# Patient Record
Sex: Female | Born: 1985 | Race: White | Hispanic: No | Marital: Married | Smoking: Never smoker
Health system: Southern US, Community
[De-identification: ages and names within clinical notes are randomized; demographics above are authoritative.]

## PROBLEM LIST (undated history)

## (undated) DIAGNOSIS — R51 Headache: Secondary | ICD-10-CM

## (undated) DIAGNOSIS — M1710 Unilateral primary osteoarthritis, unspecified knee: Secondary | ICD-10-CM

## (undated) DIAGNOSIS — F4310 Post-traumatic stress disorder, unspecified: Secondary | ICD-10-CM

## (undated) DIAGNOSIS — M545 Low back pain, unspecified: Secondary | ICD-10-CM

## (undated) DIAGNOSIS — K219 Gastro-esophageal reflux disease without esophagitis: Secondary | ICD-10-CM

## (undated) DIAGNOSIS — F40231 Fear of injections and transfusions: Secondary | ICD-10-CM

## (undated) DIAGNOSIS — M069 Rheumatoid arthritis, unspecified: Secondary | ICD-10-CM

## (undated) DIAGNOSIS — F329 Major depressive disorder, single episode, unspecified: Secondary | ICD-10-CM

## (undated) DIAGNOSIS — M179 Osteoarthritis of knee, unspecified: Secondary | ICD-10-CM

## (undated) DIAGNOSIS — I4581 Long QT syndrome: Secondary | ICD-10-CM

## (undated) DIAGNOSIS — G4733 Obstructive sleep apnea (adult) (pediatric): Secondary | ICD-10-CM

## (undated) DIAGNOSIS — J189 Pneumonia, unspecified organism: Secondary | ICD-10-CM

## (undated) DIAGNOSIS — I10 Essential (primary) hypertension: Secondary | ICD-10-CM

## (undated) DIAGNOSIS — R569 Unspecified convulsions: Secondary | ICD-10-CM

## (undated) DIAGNOSIS — F309 Manic episode, unspecified: Secondary | ICD-10-CM

## (undated) DIAGNOSIS — F32A Depression, unspecified: Secondary | ICD-10-CM

## (undated) DIAGNOSIS — R519 Headache, unspecified: Secondary | ICD-10-CM

## (undated) DIAGNOSIS — Z9989 Dependence on other enabling machines and devices: Secondary | ICD-10-CM

## (undated) DIAGNOSIS — E119 Type 2 diabetes mellitus without complications: Secondary | ICD-10-CM

## (undated) DIAGNOSIS — E669 Obesity, unspecified: Secondary | ICD-10-CM

## (undated) DIAGNOSIS — F419 Anxiety disorder, unspecified: Secondary | ICD-10-CM

## (undated) DIAGNOSIS — G8929 Other chronic pain: Secondary | ICD-10-CM

## (undated) DIAGNOSIS — G43909 Migraine, unspecified, not intractable, without status migrainosus: Secondary | ICD-10-CM

## (undated) DIAGNOSIS — Z8489 Family history of other specified conditions: Secondary | ICD-10-CM

## (undated) HISTORY — DX: Essential (primary) hypertension: I10

## (undated) HISTORY — DX: Unilateral primary osteoarthritis, unspecified knee: M17.10

## (undated) HISTORY — DX: Gastro-esophageal reflux disease without esophagitis: K21.9

## (undated) HISTORY — DX: Osteoarthritis of knee, unspecified: M17.9

---
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
## 2005-02-02 ENCOUNTER — Emergency Department (HOSPITAL_COMMUNITY): Admission: EM | Admit: 2005-02-02 | Discharge: 2005-02-02 | Payer: Self-pay | Admitting: Emergency Medicine

## 2005-06-14 ENCOUNTER — Emergency Department (HOSPITAL_COMMUNITY): Admission: EM | Admit: 2005-06-14 | Discharge: 2005-06-14 | Payer: Self-pay | Admitting: Emergency Medicine

## 2005-06-20 ENCOUNTER — Emergency Department (HOSPITAL_COMMUNITY): Admission: EM | Admit: 2005-06-20 | Discharge: 2005-06-21 | Payer: Self-pay | Admitting: Emergency Medicine

## 2005-06-22 ENCOUNTER — Emergency Department (HOSPITAL_COMMUNITY): Admission: EM | Admit: 2005-06-22 | Discharge: 2005-06-22 | Payer: Self-pay | Admitting: Emergency Medicine

## 2006-05-05 ENCOUNTER — Emergency Department (HOSPITAL_COMMUNITY): Admission: EM | Admit: 2006-05-05 | Discharge: 2006-05-05 | Payer: Self-pay | Admitting: Emergency Medicine

## 2006-05-24 ENCOUNTER — Inpatient Hospital Stay (HOSPITAL_COMMUNITY): Admission: AD | Admit: 2006-05-24 | Discharge: 2006-05-24 | Payer: Self-pay | Admitting: Family Medicine

## 2007-03-24 ENCOUNTER — Emergency Department: Payer: Self-pay | Admitting: Emergency Medicine

## 2007-03-26 ENCOUNTER — Emergency Department: Payer: Self-pay | Admitting: Internal Medicine

## 2007-05-18 ENCOUNTER — Emergency Department: Payer: Self-pay | Admitting: Emergency Medicine

## 2007-10-06 ENCOUNTER — Emergency Department: Payer: Self-pay | Admitting: Emergency Medicine

## 2008-06-18 ENCOUNTER — Emergency Department (HOSPITAL_COMMUNITY): Admission: EM | Admit: 2008-06-18 | Discharge: 2008-06-18 | Payer: Self-pay | Admitting: Family Medicine

## 2008-08-20 ENCOUNTER — Emergency Department (HOSPITAL_COMMUNITY): Admission: EM | Admit: 2008-08-20 | Discharge: 2008-08-20 | Payer: Self-pay | Admitting: Family Medicine

## 2008-11-26 ENCOUNTER — Emergency Department (HOSPITAL_COMMUNITY): Admission: EM | Admit: 2008-11-26 | Discharge: 2008-11-26 | Payer: Self-pay | Admitting: Family Medicine

## 2009-05-15 ENCOUNTER — Emergency Department: Payer: Self-pay | Admitting: Emergency Medicine

## 2009-09-15 ENCOUNTER — Emergency Department (HOSPITAL_COMMUNITY): Admission: EM | Admit: 2009-09-15 | Discharge: 2009-09-15 | Payer: Self-pay | Admitting: Emergency Medicine

## 2009-09-28 ENCOUNTER — Emergency Department (HOSPITAL_COMMUNITY): Admission: EM | Admit: 2009-09-28 | Discharge: 2009-09-28 | Payer: Self-pay | Admitting: Family Medicine

## 2009-11-09 ENCOUNTER — Emergency Department: Payer: Self-pay | Admitting: Emergency Medicine

## 2010-04-05 ENCOUNTER — Emergency Department (HOSPITAL_COMMUNITY)
Admission: EM | Admit: 2010-04-05 | Discharge: 2010-04-05 | Payer: Self-pay | Source: Home / Self Care | Admitting: Emergency Medicine

## 2010-05-18 ENCOUNTER — Emergency Department (HOSPITAL_COMMUNITY): Admission: EM | Admit: 2010-05-18 | Discharge: 2010-05-18 | Payer: Self-pay | Admitting: Emergency Medicine

## 2010-06-07 ENCOUNTER — Emergency Department (HOSPITAL_COMMUNITY): Admission: EM | Admit: 2010-06-07 | Discharge: 2010-06-07 | Payer: Self-pay | Admitting: Family Medicine

## 2010-10-10 LAB — POCT PREGNANCY, URINE
Preg Test, Ur: NEGATIVE
Preg Test, Ur: NEGATIVE

## 2010-10-26 LAB — CULTURE, ROUTINE-ABSCESS

## 2011-02-09 ENCOUNTER — Inpatient Hospital Stay (INDEPENDENT_AMBULATORY_CARE_PROVIDER_SITE_OTHER)
Admission: RE | Admit: 2011-02-09 | Discharge: 2011-02-09 | Disposition: A | Discharge status: Home / Self Care | Payer: Self-pay | Source: Ambulatory Visit | Attending: Emergency Medicine | Admitting: Emergency Medicine

## 2011-02-09 ENCOUNTER — Ambulatory Visit (INDEPENDENT_AMBULATORY_CARE_PROVIDER_SITE_OTHER): Payer: Self-pay

## 2011-02-09 DIAGNOSIS — S90129A Contusion of unspecified lesser toe(s) without damage to nail, initial encounter: Secondary | ICD-10-CM

## 2011-04-22 LAB — POCT URINALYSIS DIP (DEVICE)
Glucose, UA: NEGATIVE mg/dL
Protein, ur: 30 mg/dL — AB
pH: 5.5 (ref 5.0–8.0)

## 2011-05-27 ENCOUNTER — Encounter: Payer: Self-pay | Admitting: *Deleted

## 2011-05-27 ENCOUNTER — Emergency Department (HOSPITAL_COMMUNITY)
Admission: EM | Admit: 2011-05-27 | Discharge: 2011-05-27 | Disposition: A | Discharge status: Home / Self Care | Payer: Self-pay | Attending: Emergency Medicine | Admitting: Emergency Medicine

## 2011-05-27 DIAGNOSIS — J069 Acute upper respiratory infection, unspecified: Secondary | ICD-10-CM | POA: Insufficient documentation

## 2011-05-27 DIAGNOSIS — IMO0001 Reserved for inherently not codable concepts without codable children: Secondary | ICD-10-CM | POA: Insufficient documentation

## 2011-05-27 DIAGNOSIS — R059 Cough, unspecified: Secondary | ICD-10-CM | POA: Insufficient documentation

## 2011-05-27 DIAGNOSIS — R0602 Shortness of breath: Secondary | ICD-10-CM | POA: Insufficient documentation

## 2011-05-27 DIAGNOSIS — R05 Cough: Secondary | ICD-10-CM | POA: Insufficient documentation

## 2011-05-27 MED ORDER — ALBUTEROL SULFATE HFA 108 (90 BASE) MCG/ACT IN AERS
2.0000 | INHALATION_SPRAY | RESPIRATORY_TRACT | Status: DC | PRN
  Administered 2011-05-27: 2 via RESPIRATORY_TRACT
  Filled 2011-05-27: qty 6.7

## 2011-05-27 MED ORDER — BENZONATATE 100 MG PO CAPS: 100.0000 mg | ORAL_CAPSULE | Freq: Three times a day (TID) | ORAL | Status: AC

## 2011-05-27 NOTE — ED Notes (Signed)
Pt states "started with a runny nose mid-afternoon Tuesday and has gradually gotten worse, coughs up dark green"; denies fever/n/v

## 2011-05-27 NOTE — ED Provider Notes (Signed)
History     CSN: [id number] Arrival date & time: 05/27/2011  1:13 PM   First MD Initiated Contact with Patient 05/27/11 1405      Chief Complaint  Patient presents with  . Asthma  . Shortness of Breath  . Nasal Congestion    (Consider location/radiation/quality/duration/timing/severity/associated sxs/prior treatment) Patient is a 25 y.o. female presenting with cough. The history is provided by the patient. No language interpreter was used.  Cough This is a recurrent problem. The current episode started more than 2 days ago. The problem occurs hourly. The problem has not changed since onset.The cough is productive of sputum. There has been no fever. Associated symptoms include ear congestion, rhinorrhea, sore throat, myalgias and shortness of breath. Pertinent negatives include no chest pain, no chills, no weight loss, no ear pain, no headaches, no wheezing and no eye redness. She has tried nothing for the symptoms. She is not a smoker. Her past medical history is significant for asthma.    Past Medical History  Diagnosis Date  . Arthritis     Past Surgical History  Procedure Date  . Tonsillectomy     No family history on file.  History  Substance Use Topics  . Smoking status: Never Smoker   . Smokeless tobacco: Not on file  . Alcohol Use: No    OB History    Grav Para Term Preterm Abortions TAB SAB Ect Mult Living                  Review of Systems  Constitutional: Negative for chills and weight loss.  HENT: Positive for sore throat and rhinorrhea. Negative for ear pain.   Eyes: Negative for redness.  Respiratory: Positive for cough and shortness of breath. Negative for wheezing.   Cardiovascular: Negative for chest pain.  Musculoskeletal: Positive for myalgias.  Neurological: Negative for headaches.  All other systems reviewed and are negative.    Allergies  Review of patient's allergies indicates no known allergies.  Home Medications   Current  Outpatient Rx  Name Route Sig Dispense Refill  . ALBUTEROL SULFATE HFA 108 (90 BASE) MCG/ACT IN AERS Inhalation Inhale 2 puffs into the lungs every 6 (six) hours as needed.      Marland Kitchen DIPHENHYDRAMINE HCL 25 MG PO TABS Oral Take 100 mg by mouth every 6 (six) hours as needed.      Marland Kitchen ALIVE WOMENS ENERGY PO Oral Take 1 tablet by mouth daily.      Marland Kitchen PSEUDOEPHEDRINE-NAPROXEN NA 120-220 MG PO TB12 Oral Take 1 tablet by mouth daily.        BP 122/46  Pulse 92  Temp(Src) 97.6 F (36.4 C) (Oral)  Resp 19  Wt 325 lb (147.419 kg)  SpO2 100%  LMP 05/02/2011  Physical Exam  Nursing note and vitals reviewed. Constitutional: She appears well-developed and well-nourished.       Awake, alert, nontoxic appearance.  Morbidly obese  HENT:  Head: Normocephalic and atraumatic.  Right Ear: External ear normal.  Left Ear: External ear normal.  Nose: Nose normal.  Mouth/Throat: Oropharynx is clear and moist. No oropharyngeal exudate.  Eyes: Conjunctivae and EOM are normal. Pupils are equal, round, and reactive to light. Right eye exhibits no discharge. Left eye exhibits no discharge.  Neck: Neck supple.  Cardiovascular: Normal rate and regular rhythm.   Pulmonary/Chest: Effort normal and breath sounds normal. She exhibits no tenderness.  Abdominal: Soft. Bowel sounds are normal. There is no tenderness. There is no rebound.  Musculoskeletal: She exhibits no tenderness.       Baseline ROM, no obvious new focal weakness  Lymphadenopathy:    She has no cervical adenopathy.  Neurological:       Mental status and motor strength appears baseline for patient and situation  Skin: No rash noted.  Psychiatric: She has a normal mood and affect.    ED Course  Procedures (including critical care time)  Labs Reviewed - No data to display No results found.   No diagnosis found.    MDM  3 days' onset of symptoms consistent with upper respiratory infection. Patient has not had any fever, and her lungs are  clear on exam. low suspicious for pneumonia.  She has a history of asthma and she has ran out of albuterol. Therefore, I will offer her an albuterol in the ED.  I will also prescribe cough medication.  Patient is in no acute distress vital signs stable.        Fayrene Helper, PA 05/27/11 1427

## 2011-05-28 NOTE — ED Provider Notes (Signed)
Medical screening examination/treatment/procedure(s) were conducted as a shared visit with non-physician practitioner(s) and myself.  I personally evaluated the patient during the encounter  Courtney Pheasant T Shayona Hibbitts, MD 05/28/11 1055 

## 2011-12-20 ENCOUNTER — Emergency Department (HOSPITAL_COMMUNITY)
Admission: EM | Admit: 2011-12-20 | Discharge: 2011-12-20 | Disposition: A | Discharge status: Home / Self Care | Payer: Self-pay | Attending: Emergency Medicine | Admitting: Emergency Medicine

## 2011-12-20 ENCOUNTER — Emergency Department (HOSPITAL_COMMUNITY): Payer: Self-pay

## 2011-12-20 ENCOUNTER — Encounter (HOSPITAL_COMMUNITY): Payer: Self-pay | Admitting: *Deleted

## 2011-12-20 DIAGNOSIS — W010XXA Fall on same level from slipping, tripping and stumbling without subsequent striking against object, initial encounter: Secondary | ICD-10-CM | POA: Insufficient documentation

## 2011-12-20 DIAGNOSIS — J45909 Unspecified asthma, uncomplicated: Secondary | ICD-10-CM | POA: Insufficient documentation

## 2011-12-20 DIAGNOSIS — M79609 Pain in unspecified limb: Secondary | ICD-10-CM | POA: Insufficient documentation

## 2011-12-20 DIAGNOSIS — M545 Low back pain, unspecified: Secondary | ICD-10-CM | POA: Insufficient documentation

## 2011-12-20 DIAGNOSIS — M549 Dorsalgia, unspecified: Secondary | ICD-10-CM

## 2011-12-20 LAB — URINALYSIS, ROUTINE W REFLEX MICROSCOPIC
Ketones, ur: NEGATIVE mg/dL
Nitrite: NEGATIVE
Specific Gravity, Urine: 1.025 (ref 1.005–1.030)
Urobilinogen, UA: 1 mg/dL (ref 0.0–1.0)
pH: 5.5 (ref 5.0–8.0)

## 2011-12-20 LAB — URINE MICROSCOPIC-ADD ON

## 2011-12-20 MED ORDER — ALBUTEROL SULFATE HFA 108 (90 BASE) MCG/ACT IN AERS: 2.0000 | INHALATION_SPRAY | Freq: Four times a day (QID) | RESPIRATORY_TRACT | Status: DC | PRN

## 2011-12-20 MED ORDER — ALBUTEROL SULFATE HFA 108 (90 BASE) MCG/ACT IN AERS: 1.0000 | INHALATION_SPRAY | Freq: Four times a day (QID) | RESPIRATORY_TRACT | Status: DC | PRN

## 2011-12-20 MED ORDER — OXYCODONE-ACETAMINOPHEN 5-325 MG PO TABS: 2.0000 | ORAL_TABLET | Freq: Four times a day (QID) | ORAL | Status: AC | PRN

## 2011-12-20 MED ORDER — OXYCODONE-ACETAMINOPHEN 5-325 MG PO TABS
2.0000 | ORAL_TABLET | Freq: Once | ORAL | Status: AC
  Administered 2011-12-20: 2 via ORAL
  Filled 2011-12-20: qty 2

## 2011-12-20 NOTE — ED Provider Notes (Signed)
History  Scribed for [employer]. Courtney Mayers, MD, the patient was seen in room STRE4/STRE4. This chart was scribed by Candelaria Stagers. The patient's care started at 2:37 PM   CSN: [id number]  Arrival date & time 12/20/11  1350   First MD Initiated Contact with Patient 12/20/11 1433      Chief Complaint  Patient presents with  . Back Pain     HPI Courtney Zamora is a 26 y.o. female who presents to the Emergency Department complaining of lower back pain that started several days ago after falling over a baby gate.  She states that the pain is worse on the right side and radiates down the right leg and into the abdomen.  Standing makes the pain better while sitting, walking, and lying down makes the pain worse.  She denies dysuria, numbness, or tingling.    Past Medical History  Diagnosis Date  . Asthma     Past Surgical History  Procedure Date  . Tonsillectomy     History reviewed. No pertinent family history.  History  Substance Use Topics  . Smoking status: Never Smoker   . Smokeless tobacco: Not on file  . Alcohol Use: No    OB History    Grav Para Term Preterm Abortions TAB SAB Ect Mult Living                  Review of Systems  Gastrointestinal: Positive for abdominal pain.  Genitourinary: Negative for dysuria.  Musculoskeletal: Positive for back pain (lower back pain).       Right leg pain   Neurological: Negative for weakness and numbness.  All other systems reviewed and are negative.    Allergies  Review of patient's allergies indicates no known allergies.  Home Medications   Current Outpatient Rx  Name Route Sig Dispense Refill  . ALBUTEROL SULFATE HFA 108 (90 BASE) MCG/ACT IN AERS Inhalation Inhale 2 puffs into the lungs every 6 (six) hours as needed. For shortness of breath      BP 124/59  Pulse 108  Temp(Src) 98 F (36.7 C) (Oral)  Resp 20  Ht 5\' 4"  (1.626 m)  Wt 340 lb (154.223 kg)  BMI 58.36 kg/m2  SpO2 100%  Physical Exam    Nursing note and vitals reviewed. Constitutional: She is oriented to person, place, and time. She appears well-developed and well-nourished. No distress.  HENT:  Head: Normocephalic and atraumatic.  Eyes: EOM are normal. Pupils are equal, round, and reactive to light.  Neck: Neck supple. No tracheal deviation present.  Cardiovascular: Normal rate and intact distal pulses.   Pulmonary/Chest: Effort normal. No respiratory distress.  Abdominal: Soft. She exhibits no distension.  Musculoskeletal: Normal range of motion. She exhibits no edema.       Tenderness on palpation to the lower back, especially the right side.    Neurological: She is alert and oriented to person, place, and time.  Skin: Skin is warm and dry.  Psychiatric: She has a normal mood and affect. Her behavior is normal.    ED Course  Procedures  DIAGNOSTIC STUDIES: Oxygen Saturation is 100% on room air, normal by my interpretation.    COORDINATION OF CARE:  2:07PM Ordered: DG Lumbar Spine Complete;  Pregnancy, urine POC; Urinalysis, Routine w relfex microscopic      Labs Reviewed  URINALYSIS, ROUTINE W REFLEX MICROSCOPIC - Abnormal; Notable for the following:    Color, Urine AMBER (*) BIOCHEMICALS MAY BE AFFECTED BY COLOR   APPearance  CLOUDY (*)    Hgb urine dipstick SMALL (*)    Bilirubin Urine SMALL (*)    Protein, ur 30 (*)    Leukocytes, UA SMALL (*)    All other components within normal limits  URINE MICROSCOPIC-ADD ON - Abnormal; Notable for the following:    Bacteria, UA FEW (*)    All other components within normal limits  POCT PREGNANCY, URINE   Dg Lumbar Spine Complete  12/20/2011  *RADIOLOGY REPORT*  Clinical Data: Fall, back pain  LUMBAR SPINE - COMPLETE 4+ VIEW  Comparison: 06/22/2005  Findings: Normal alignment.  No compression fracture, wedge shaped deformity or focal kyphosis.  Preserved vertebral body heights and disc spaces.  Facets aligned.  Intact pedicles.  No pars defects. Normal SI  joints.  IMPRESSION: No acute finding  Original Report Authenticated By: Judie Petit. Ruel Favors, M.D.     No diagnosis found.    MDM  Musculoskeletal back pain after fall. Xrays and UA neg. Pain improved with percocet.   I personally performed the services described in the documentation, which were scribed in my presence. The recorded information has been reviewed and considered.            Maikel Neisler B. Courtney Mayers, MD 12/20/11 [phone]

## 2011-12-20 NOTE — Discharge Instructions (Signed)
Back Pain, Adult Low back pain is very common. About 1 in 5 people have back pain.The cause of low back pain is rarely dangerous. The pain often gets better over time.About half of people with a sudden onset of back pain feel better in just 2 weeks. About 8 in 10 people feel better by 6 weeks.  CAUSES Some common causes of back pain include:  Strain of the muscles or ligaments supporting the spine.   Wear and tear (degeneration) of the spinal discs.   Arthritis.   Direct injury to the back.  DIAGNOSIS Most of the time, the direct cause of low back pain is not known.However, back pain can be treated effectively even when the exact cause of the pain is unknown.Answering your caregiver's questions about your overall health and symptoms is one of the most accurate ways to make sure the cause of your pain is not dangerous. If your caregiver needs more information, he or she may order lab work or imaging tests (X-rays or MRIs).However, even if imaging tests show changes in your back, this usually does not require surgery. HOME CARE INSTRUCTIONS For many people, back pain returns.Since low back pain is rarely dangerous, it is often a condition that people can learn to manageon their own.   Remain active. It is stressful on the back to sit or stand in one place. Do not sit, drive, or stand in one place for more than 30 minutes at a time. Take short walks on level surfaces as soon as pain allows.Try to increase the length of time you walk each day.   Do not stay in bed.Resting more than 1 or 2 days can delay your recovery.   Do not avoid exercise or work.Your body is made to move.It is not dangerous to be active, even though your back may hurt.Your back will likely heal faster if you return to being active before your pain is gone.   Pay attention to your body when you bend and lift. Many people have less discomfortwhen lifting if they bend their knees, keep the load close to their  bodies,and avoid twisting. Often, the most comfortable positions are those that put less stress on your recovering back.   Find a comfortable position to sleep. Use a firm mattress and lie on your side with your knees slightly bent. If you lie on your back, put a pillow under your knees.   Only take over-the-counter or prescription medicines as directed by your caregiver. Over-the-counter medicines to reduce pain and inflammation are often the most helpful.Your caregiver may prescribe muscle relaxant drugs.These medicines help dull your pain so you can more quickly return to your normal activities and healthy exercise.   Put ice on the injured area.   Put ice in a plastic bag.   Place a towel between your skin and the bag.   Leave the ice on for 15 to 20 minutes, 3 to 4 times a day for the first 2 to 3 days. After that, ice and heat may be alternated to reduce pain and spasms.   Ask your caregiver about trying back exercises and gentle massage. This may be of some benefit.   Avoid feeling anxious or stressed.Stress increases muscle tension and can worsen back pain.It is important to recognize when you are anxious or stressed and learn ways to manage it.Exercise is a great option.  SEEK MEDICAL CARE IF:  You have pain that is not relieved with rest or medicine.   You have   pain that does not improve in 1 week.   You have new symptoms.   You are generally not feeling well.  SEEK IMMEDIATE MEDICAL CARE IF:   You have pain that radiates from your back into your legs.   You develop new bowel or bladder control problems.   You have unusual weakness or numbness in your arms or legs.   You develop nausea or vomiting.   You develop abdominal pain.   You feel faint.  Document Released: 07/04/2005 Document Revised: 06/23/2011 Document Reviewed: 11/22/2010 [employer] Patient Information 2012 [employer]. 

## 2011-12-20 NOTE — ED Notes (Addendum)
Pt reports several day hx of increasing lower back pain that radiates into pelvic region and right leg. Pt states hurts to sit down. Denies urinary symptoms. Denies abdominal pain. Denies any vaginal symptoms.

## 2012-01-05 ENCOUNTER — Inpatient Hospital Stay (HOSPITAL_COMMUNITY)
Admission: AD | Admit: 2012-01-05 | Discharge: 2012-01-06 | Disposition: A | Discharge status: Hospice | Payer: Self-pay | Source: Ambulatory Visit | Attending: Obstetrics & Gynecology | Admitting: Obstetrics & Gynecology

## 2012-01-05 DIAGNOSIS — B3731 Acute candidiasis of vulva and vagina: Secondary | ICD-10-CM | POA: Insufficient documentation

## 2012-01-05 DIAGNOSIS — B373 Candidiasis of vulva and vagina: Secondary | ICD-10-CM | POA: Insufficient documentation

## 2012-01-05 DIAGNOSIS — B379 Candidiasis, unspecified: Secondary | ICD-10-CM

## 2012-01-05 DIAGNOSIS — N72 Inflammatory disease of cervix uteri: Secondary | ICD-10-CM | POA: Insufficient documentation

## 2012-01-05 DIAGNOSIS — N949 Unspecified condition associated with female genital organs and menstrual cycle: Secondary | ICD-10-CM | POA: Insufficient documentation

## 2012-01-05 HISTORY — DX: Obesity, unspecified: E66.9

## 2012-01-05 NOTE — MAU Note (Signed)
Pt presents with the complaints of burning in the vaginal area. Pelvic pain. Unsure if she is pregnant.

## 2012-01-06 ENCOUNTER — Encounter (HOSPITAL_COMMUNITY): Payer: Self-pay | Admitting: *Deleted

## 2012-01-06 LAB — WET PREP, GENITAL
Clue Cells Wet Prep HPF POC: NONE SEEN
Yeast Wet Prep HPF POC: NONE SEEN

## 2012-01-06 LAB — URINALYSIS, ROUTINE W REFLEX MICROSCOPIC
Bilirubin Urine: NEGATIVE
Glucose, UA: NEGATIVE mg/dL
Ketones, ur: NEGATIVE mg/dL
Specific Gravity, Urine: >1.03 — ABNORMAL HIGH (ref 1.005–1.030)
pH: 6 (ref 5.0–8.0)

## 2012-01-06 LAB — POCT PREGNANCY, URINE: Preg Test, Ur: NEGATIVE

## 2012-01-06 LAB — GC/CHLAMYDIA PROBE AMP, GENITAL: Chlamydia, DNA Probe: POSITIVE — AB

## 2012-01-06 LAB — URINE MICROSCOPIC-ADD ON

## 2012-01-06 MED ORDER — FLUCONAZOLE 150 MG PO TABS
150.0000 mg | ORAL_TABLET | Freq: Once | ORAL | Status: AC
  Administered 2012-01-06: 150 mg via ORAL
  Filled 2012-01-06: qty 1

## 2012-01-06 MED ORDER — AZITHROMYCIN 250 MG PO TABS
1000.0000 mg | ORAL_TABLET | Freq: Once | ORAL | Status: AC
  Administered 2012-01-06: 1000 mg via ORAL
  Filled 2012-01-06: qty 4

## 2012-01-06 NOTE — MAU Note (Signed)
Vaginal burning and irritation for the past two weeks that worsened today while at the pool.   Pt tried to clean out the chlorine by douching and the burning became worse.

## 2012-01-06 NOTE — MAU Provider Note (Signed)
History     CSN: [id number]  Arrival date & time 01/05/12  2156   None     Chief Complaint  Patient presents with  . Pain    HPI Courtney Zamora is a 26 y.o. female who presents to MAU for vaginal burning and itching. The symptoms have been off and on for the past week.  No dysuria. Vaginal discharge thick white. Thought had a yeast infection. Today went swimming and symptoms got worse. Went home and douched and got even worse. The history was provided by the patient.  Past Medical History  Diagnosis Date  . Asthma   . Obesity     Past Surgical History  Procedure Date  . Tonsillectomy     Family History  Problem Relation Age of Onset  . Hypothyroidism Mother   . Diabetes Father   . Diabetes Paternal Uncle   . Hypertension Maternal Grandmother   . Hypertension Maternal Grandfather   . Hypertension Paternal Grandmother   . Diabetes Paternal Grandfather   . Heart disease Paternal Grandfather   . Hypertension Paternal Grandfather     History  Substance Use Topics  . Smoking status: Never Smoker   . Smokeless tobacco: Not on file  . Alcohol Use: No    OB History    Grav Para Term Preterm Abortions TAB SAB Ect Mult Living   1 1 1        0      Review of Systems  Constitutional: Positive for appetite change. Negative for fever, chills, diaphoresis and fatigue.  HENT: Negative for ear pain, congestion, sore throat, facial swelling, neck pain, neck stiffness, dental problem and sinus pressure.   Eyes: Negative for photophobia, pain and discharge.  Respiratory: Positive for wheezing. Negative for cough and chest tightness.   Cardiovascular: Negative for chest pain and palpitations.  Gastrointestinal: Positive for nausea, vomiting and abdominal pain. Negative for diarrhea, constipation and abdominal distention.  Genitourinary: Positive for urgency, frequency and vaginal discharge. Negative for dysuria, flank pain and difficulty urinating. Vaginal bleeding:  spotting.  Musculoskeletal: Positive for back pain. Negative for myalgias and gait problem.  Skin: Negative for color change and rash.  Neurological: Negative for dizziness, speech difficulty, weakness, light-headedness, numbness and headaches.  Psychiatric/Behavioral: Negative for confusion and agitation.       Anxiety for past month since husband left.    Allergies  Review of patient's allergies indicates no known allergies.  Home Medications  No current outpatient prescriptions on file.  BP 119/98  Pulse 84  Temp 98.6 F (37 C)  Resp 18  Ht 5\' 1"  (1.549 m)  Wt 333 lb 6.4 oz (151.229 kg)  BMI 63.00 kg/m2  LMP 11/14/2011  Physical Exam  Nursing note and vitals reviewed. Constitutional: She is oriented to person, place, and time. She appears well-developed and well-nourished. No distress.  HENT:  Head: Normocephalic.  Eyes: EOM are normal.  Neck: Neck supple.  Cardiovascular: Normal rate.   Pulmonary/Chest: Effort normal.  Abdominal: Soft. There is no tenderness.  Genitourinary:       External genitalia without lesions. Yellow discharge vaginal vault. Cervix inflamed. No CMT, no adnexal tenderness. Unable to palpate uterus due to patient habitus.  Musculoskeletal: Normal range of motion.  Neurological: She is alert and oriented to person, place, and time. No cranial nerve deficit.  Skin: Skin is warm and dry.  Psychiatric: She has a normal mood and affect. Her behavior is normal. Judgment and thought content normal.   Results  for orders placed during the hospital encounter of 01/05/12 (from the past 24 hour(s))  URINALYSIS, ROUTINE W REFLEX MICROSCOPIC     Status: Abnormal   Collection Time   01/05/12 11:40 PM      Component Value Range   Color, Urine YELLOW  YELLOW   APPearance CLEAR  CLEAR   Specific Gravity, Urine >1.030 (*) 1.005 - 1.030   pH 6.0  5.0 - 8.0   Glucose, UA NEGATIVE  NEGATIVE mg/dL   Hgb urine dipstick LARGE (*) NEGATIVE   Bilirubin Urine NEGATIVE   NEGATIVE   Ketones, ur NEGATIVE  NEGATIVE mg/dL   Protein, ur NEGATIVE  NEGATIVE mg/dL   Urobilinogen, UA 0.2  0.0 - 1.0 mg/dL   Nitrite NEGATIVE  NEGATIVE   Leukocytes, UA TRACE (*) NEGATIVE  URINE MICROSCOPIC-ADD ON     Status: Abnormal   Collection Time   01/05/12 11:40 PM      Component Value Range   Squamous Epithelial / LPF FEW (*) RARE   WBC, UA 3-6  <3 WBC/hpf   RBC / HPF 3-6  <3 RBC/hpf   Bacteria, UA RARE  RARE   Urine-Other MUCOUS PRESENT    POCT PREGNANCY, URINE     Status: Normal   Collection Time   01/06/12 12:33 AM      Component Value Range   Preg Test, Ur NEGATIVE  NEGATIVE  WET PREP, GENITAL     Status: Abnormal   Collection Time   01/06/12  1:10 AM      Component Value Range   Yeast Wet Prep HPF POC NONE SEEN  NONE SEEN   Trich, Wet Prep NONE SEEN  NONE SEEN   Clue Cells Wet Prep HPF POC NONE SEEN  NONE SEEN   WBC, Wet Prep HPF POC MANY (*) NONE SEEN    Assessment: Cervicitis   Monilia     Plan:  Diflucan 150 mg po   Zithromax 1 gram po   Follow up with [facility], return here as needed ED Course  Procedures  MDM

## 2012-01-18 ENCOUNTER — Telehealth (HOSPITAL_COMMUNITY): Payer: Self-pay | Admitting: Nurse Practitioner

## 2012-01-18 NOTE — Telephone Encounter (Signed)
Telephone call to patient regarding positive chlamydia culture, patient notified.  Patient was treated at time of visit.  Instructed patient to notify her partner for treatment.  Report of treatment faxed to health department.  

## 2012-03-07 ENCOUNTER — Emergency Department (HOSPITAL_BASED_OUTPATIENT_CLINIC_OR_DEPARTMENT_OTHER)
Admission: EM | Admit: 2012-03-07 | Discharge: 2012-03-07 | Disposition: A | Discharge status: Home / Self Care | Payer: Self-pay | Attending: Emergency Medicine | Admitting: Emergency Medicine

## 2012-03-07 ENCOUNTER — Encounter (HOSPITAL_BASED_OUTPATIENT_CLINIC_OR_DEPARTMENT_OTHER): Payer: Self-pay | Admitting: *Deleted

## 2012-03-07 DIAGNOSIS — N390 Urinary tract infection, site not specified: Secondary | ICD-10-CM

## 2012-03-07 DIAGNOSIS — M549 Dorsalgia, unspecified: Secondary | ICD-10-CM | POA: Insufficient documentation

## 2012-03-07 DIAGNOSIS — M545 Low back pain: Secondary | ICD-10-CM

## 2012-03-07 DIAGNOSIS — E669 Obesity, unspecified: Secondary | ICD-10-CM | POA: Insufficient documentation

## 2012-03-07 DIAGNOSIS — J45909 Unspecified asthma, uncomplicated: Secondary | ICD-10-CM | POA: Insufficient documentation

## 2012-03-07 LAB — URINALYSIS, ROUTINE W REFLEX MICROSCOPIC
Bilirubin Urine: NEGATIVE
Glucose, UA: NEGATIVE mg/dL
Ketones, ur: NEGATIVE mg/dL
Nitrite: NEGATIVE
Protein, ur: NEGATIVE mg/dL
Specific Gravity, Urine: 1.03 (ref 1.005–1.030)
Urobilinogen, UA: 1 mg/dL (ref 0.0–1.0)
pH: 6 (ref 5.0–8.0)

## 2012-03-07 LAB — URINE MICROSCOPIC-ADD ON

## 2012-03-07 LAB — PREGNANCY, URINE: Preg Test, Ur: NEGATIVE

## 2012-03-07 MED ORDER — SULFAMETHOXAZOLE-TRIMETHOPRIM 800-160 MG PO TABS: 1.0000 | ORAL_TABLET | Freq: Two times a day (BID) | ORAL | Status: AC

## 2012-03-07 MED ORDER — TRAMADOL HCL 50 MG PO TABS: 50.0000 mg | ORAL_TABLET | Freq: Four times a day (QID) | ORAL | Status: AC | PRN

## 2012-03-07 MED ORDER — SULFAMETHOXAZOLE-TMP DS 800-160 MG PO TABS
1.0000 | ORAL_TABLET | Freq: Once | ORAL | Status: AC
  Administered 2012-03-07: 1 via ORAL
  Filled 2012-03-07: qty 1

## 2012-03-07 MED ORDER — TRAMADOL HCL 50 MG PO TABS
50.0000 mg | ORAL_TABLET | Freq: Once | ORAL | Status: AC
  Administered 2012-03-07: 50 mg via ORAL
  Filled 2012-03-07: qty 1

## 2012-03-07 NOTE — ED Notes (Addendum)
Pt reports back pain x 2 days with hematuria and odor to urine. LMP in May. Pt unsure about pregnancy status

## 2012-03-07 NOTE — ED Provider Notes (Signed)
History     CSN: [id number]  Arrival date & time 03/07/12  2113   First MD Initiated Contact with Patient 03/07/12 2240      Chief Complaint  Patient presents with  . Back Pain    (Consider location/radiation/quality/duration/timing/severity/associated sxs/prior treatment) HPI Comments: Back pain for the past two days.  No known injury or trauma.  Has had uti's in the past.  This is not unlike that.  No radiation into legs.  No bowel or bladder complaints.    Patient is a 26 y.o. female presenting with back pain. The history is provided by the patient.  Back Pain  This is a new problem. The current episode started 2 days ago. The problem occurs constantly. The problem has been gradually worsening. The pain is associated with no known injury. The pain is present in the lumbar spine. The quality of the pain is described as stabbing. The pain does not radiate. The pain is moderate. The symptoms are aggravated by bending, twisting and certain positions.    Past Medical History  Diagnosis Date  . Asthma   . Obesity     Past Surgical History  Procedure Date  . Tonsillectomy     Family History  Problem Relation Age of Onset  . Hypothyroidism Mother   . Diabetes Father   . Diabetes Paternal Uncle   . Hypertension Maternal Grandmother   . Hypertension Maternal Grandfather   . Hypertension Paternal Grandmother   . Diabetes Paternal Grandfather   . Heart disease Paternal Grandfather   . Hypertension Paternal Grandfather     History  Substance Use Topics  . Smoking status: Never Smoker   . Smokeless tobacco: Never Used  . Alcohol Use: No    OB History    Grav Para Term Preterm Abortions TAB SAB Ect Mult Living   1 1 1        0      Review of Systems  Musculoskeletal: Positive for back pain.  All other systems reviewed and are negative.    Allergies  Review of patient's allergies indicates no known allergies.  Home Medications   Current Outpatient Rx  Name  Route Sig Dispense Refill  . ACETAMINOPHEN 500 MG PO TABS Oral Take 500 mg by mouth every 6 (six) hours as needed. For pain.    . ALBUTEROL SULFATE HFA 108 (90 BASE) MCG/ACT IN AERS Inhalation Inhale 2 puffs into the lungs every 6 (six) hours as needed. For shortness of breath 1 Inhaler 0    BP 173/90  Pulse 100  Temp 98.3 F (36.8 C) (Oral)  Resp 18  Ht 5\' 4"  (1.626 m)  Wt 329 lb (149.233 kg)  BMI 56.47 kg/m2  SpO2 100%  LMP 11/20/2011  Physical Exam  Nursing note and vitals reviewed. Constitutional: She is oriented to person, place, and time. She appears well-developed and well-nourished. No distress.  HENT:  Head: Normocephalic and atraumatic.  Neck: Normal range of motion. Neck supple.  Cardiovascular: Normal rate and regular rhythm.   No murmur heard. Pulmonary/Chest: Effort normal and breath sounds normal. No respiratory distress.  Abdominal: Soft. Bowel sounds are normal. She exhibits no distension. There is no tenderness.       Abdomen is morbidly obese.  Musculoskeletal: Normal range of motion. She exhibits no edema.  Neurological: She is alert and oriented to person, place, and time. She exhibits normal muscle tone. Coordination normal.       Ambulatory without difficulty.  Unable to elicit reflexes  due to obesity.  Skin: Skin is warm and dry. She is not diaphoretic.    ED Course  Procedures (including critical care time)  Labs Reviewed  URINALYSIS, ROUTINE W REFLEX MICROSCOPIC - Abnormal; Notable for the following:    APPearance CLOUDY (*)     Hgb urine dipstick LARGE (*)     Leukocytes, UA MODERATE (*)     All other components within normal limits  URINE MICROSCOPIC-ADD ON - Abnormal; Notable for the following:    Squamous Epithelial / LPF FEW (*)     Bacteria, UA MANY (*)     All other components within normal limits  PREGNANCY, URINE   No results found.   No diagnosis found.    MDM  UA is suggestive of uti, but symptoms and exam are more  consistent with musculoskeletal etiology.  Will treat for both.        Geoffery Lyons, MD 03/07/12 2249

## 2012-03-25 ENCOUNTER — Encounter (HOSPITAL_BASED_OUTPATIENT_CLINIC_OR_DEPARTMENT_OTHER): Payer: Self-pay | Admitting: *Deleted

## 2012-03-25 DIAGNOSIS — J45909 Unspecified asthma, uncomplicated: Secondary | ICD-10-CM | POA: Insufficient documentation

## 2012-03-25 MED ORDER — IPRATROPIUM BROMIDE 0.02 % IN SOLN
RESPIRATORY_TRACT | Status: AC
  Administered 2012-03-25: 0.5 mg
  Filled 2012-03-25: qty 2.5

## 2012-03-25 MED ORDER — ALBUTEROL SULFATE (5 MG/ML) 0.5% IN NEBU
INHALATION_SOLUTION | RESPIRATORY_TRACT | Status: AC
  Administered 2012-03-25: 5 mg
  Filled 2012-03-25: qty 1

## 2012-03-25 NOTE — ED Notes (Signed)
Pt. C/o feeling sob for the past couple hours. States she is out of her albuterol inhaler that she typically takes 4 times a day. Pt with faint ant wheezing on exam. resp even and slightly labored with ambulation.

## 2012-03-26 ENCOUNTER — Emergency Department (HOSPITAL_BASED_OUTPATIENT_CLINIC_OR_DEPARTMENT_OTHER)
Admission: EM | Admit: 2012-03-26 | Discharge: 2012-03-26 | Disposition: A | Discharge status: Home / Self Care | Payer: Self-pay | Attending: Emergency Medicine | Admitting: Emergency Medicine

## 2012-04-01 ENCOUNTER — Emergency Department (HOSPITAL_BASED_OUTPATIENT_CLINIC_OR_DEPARTMENT_OTHER)
Admission: EM | Admit: 2012-04-01 | Discharge: 2012-04-02 | Disposition: A | Discharge status: Home / Self Care | Payer: Self-pay | Attending: Emergency Medicine | Admitting: Emergency Medicine

## 2012-04-01 ENCOUNTER — Emergency Department (HOSPITAL_BASED_OUTPATIENT_CLINIC_OR_DEPARTMENT_OTHER): Payer: Self-pay

## 2012-04-01 ENCOUNTER — Encounter (HOSPITAL_BASED_OUTPATIENT_CLINIC_OR_DEPARTMENT_OTHER): Payer: Self-pay | Admitting: *Deleted

## 2012-04-01 DIAGNOSIS — J45909 Unspecified asthma, uncomplicated: Secondary | ICD-10-CM | POA: Insufficient documentation

## 2012-04-01 DIAGNOSIS — S6000XA Contusion of unspecified finger without damage to nail, initial encounter: Secondary | ICD-10-CM | POA: Insufficient documentation

## 2012-04-01 DIAGNOSIS — X58XXXA Exposure to other specified factors, initial encounter: Secondary | ICD-10-CM | POA: Insufficient documentation

## 2012-04-01 DIAGNOSIS — E669 Obesity, unspecified: Secondary | ICD-10-CM | POA: Insufficient documentation

## 2012-04-01 LAB — PREGNANCY, URINE: Preg Test, Ur: NEGATIVE

## 2012-04-01 NOTE — ED Notes (Signed)
Pt states she does not remember injuring her right ring finger, but it is painful, bruised and swollen. Unable to move. Feels some touch. Cap refill < 3 sec.

## 2012-04-02 MED ORDER — TRAMADOL HCL 50 MG PO TABS: 50.0000 mg | ORAL_TABLET | Freq: Four times a day (QID) | ORAL | Status: AC | PRN

## 2012-04-02 NOTE — ED Notes (Signed)
rx x 1 given for tramadol and note for work-

## 2012-04-02 NOTE — ED Provider Notes (Signed)
History   This chart was scribed for [name], MD by Sofie Rower. The patient was seen in room MH10/MH10 and the patient's care was started at 11:53PM    CSN: [id number]  Arrival date & time 04/01/12  2141   First MD Initiated Contact with Patient 04/01/12 2353      Chief Complaint  Patient presents with  . Hand Pain    (Consider location/radiation/quality/duration/timing/severity/associated sxs/prior treatment) Patient is a 26 y.o. female presenting with hand pain. The history is provided by the patient. No language interpreter was used.  Hand Pain This is a new problem. The current episode started 6 to 12 hours ago. The problem occurs constantly. The problem has been gradually worsening. Pertinent negatives include no chest pain, no abdominal pain, no headaches and no shortness of breath. The symptoms are aggravated by bending. Nothing relieves the symptoms. She has tried nothing for the symptoms. The treatment provided no relief.    Courtney Zamora is a 26 y.o. female  who presents to the Emergency Department complaining of   sudden, progressively worsening, hand pain located at the right hand onset yesterday with associated symptoms of swelling and ecchymosis located at the right ring finger. The pt reports she does not recall injuring her right ring finger, however it is painful and difficult to move. Modifying factors include elevation of the right hand which provides moderate pain relief. The pt has a hx of asthma and obesity.   The pt does not smoke or drink alcohol.      Past Medical History  Diagnosis Date  . Asthma   . Obesity     Past Surgical History  Procedure Date  . Tonsillectomy     Family History  Problem Relation Age of Onset  . Hypothyroidism Mother   . Diabetes Father   . Diabetes Paternal Uncle   . Hypertension Maternal Grandmother   . Hypertension Maternal Grandfather   . Hypertension Paternal Grandmother   . Diabetes Paternal  Grandfather   . Heart disease Paternal Grandfather   . Hypertension Paternal Grandfather     History  Substance Use Topics  . Smoking status: Never Smoker   . Smokeless tobacco: Never Used  . Alcohol Use: No    OB History    Grav Para Term Preterm Abortions TAB SAB Ect Mult Living   1 1 1        0      Review of Systems  Respiratory: Negative for shortness of breath.   Cardiovascular: Negative for chest pain.  Gastrointestinal: Negative for abdominal pain.  Neurological: Negative for headaches.  All other systems reviewed and are negative.    Allergies  Review of patient's allergies indicates no known allergies.  Home Medications   Current Outpatient Rx  Name Route Sig Dispense Refill  . ALBUTEROL SULFATE HFA 108 (90 BASE) MCG/ACT IN AERS Inhalation Inhale 2 puffs into the lungs every 6 (six) hours as needed. For shortness of breath 1 Inhaler 0    BP 141/65  Pulse 97  Temp 98.4 F (36.9 C) (Oral)  Resp 20  Ht 5\' 4"  (1.626 m)  Wt 310 lb (140.615 kg)  BMI 53.21 kg/m2  SpO2 98%  LMP 11/20/2011  Physical Exam  Nursing note and vitals reviewed. Constitutional: She is oriented to person, place, and time. She appears well-developed and well-nourished.  HENT:  Head: Atraumatic.  Nose: Nose normal.  Eyes: Conjunctivae normal are normal.  Neck: Normal range of motion.  Pulmonary/Chest: Effort normal.  Musculoskeletal:       Right ring finer over the palmer side of PIP joint tenderness, ecchymosis present, mild swelling, less than 3 sec cap refill, no DIP or MCP tenderness, good ROM of DIP joint, no sign of tendon injury.  Neurological: She is alert and oriented to person, place, and time.  Skin: Skin is warm and dry.  Psychiatric: She has a normal mood and affect. Her behavior is normal.    ED Course  Procedures (including critical care time)  DIAGNOSTIC STUDIES: Oxygen Saturation is 98% on room air, normal by my interpretation.    COORDINATION OF  CARE:    12:15AM- X-ray results, pain management, and application of ice discussed with patient. Pt agrees to treatment.    Labs Reviewed  PREGNANCY, URINE   Dg Finger Ring Right  04/01/2012  *RADIOLOGY REPORT*  Clinical Data: Pain and swelling  RIGHT RING FINGER 2+V  Comparison: None.  Findings: Frontal, oblique, and lateral views were obtained.  There is no fracture or dislocation.  Joint spaces appear intact.  No erosive change.  IMPRESSION: No abnormality noted.   Original Report Authenticated By: Arvin Collard. WOODRUFF III, M.D.      1. Traumatic hematoma of finger       MDM   Evidence of hematoma to the right ring finger. There is normal x-rays and she is neurovascularly intact. She has normal tendon function and capillary refill. He appears that she may have had a spontaneous ruptured blood vessel.      I personally performed the services described in this documentation, which was scribed in my presence.  The recorded information has been reviewed and considered.    [name], MD 04/02/12 [phone]

## 2012-04-09 ENCOUNTER — Emergency Department (HOSPITAL_BASED_OUTPATIENT_CLINIC_OR_DEPARTMENT_OTHER): Payer: Self-pay

## 2012-04-09 ENCOUNTER — Emergency Department (HOSPITAL_BASED_OUTPATIENT_CLINIC_OR_DEPARTMENT_OTHER)
Admission: EM | Admit: 2012-04-09 | Discharge: 2012-04-09 | Disposition: A | Discharge status: Home / Self Care | Payer: Self-pay | Attending: Emergency Medicine | Admitting: Emergency Medicine

## 2012-04-09 ENCOUNTER — Encounter (HOSPITAL_BASED_OUTPATIENT_CLINIC_OR_DEPARTMENT_OTHER): Payer: Self-pay | Admitting: Emergency Medicine

## 2012-04-09 DIAGNOSIS — N760 Acute vaginitis: Secondary | ICD-10-CM | POA: Insufficient documentation

## 2012-04-09 DIAGNOSIS — E669 Obesity, unspecified: Secondary | ICD-10-CM | POA: Insufficient documentation

## 2012-04-09 DIAGNOSIS — J45909 Unspecified asthma, uncomplicated: Secondary | ICD-10-CM | POA: Insufficient documentation

## 2012-04-09 DIAGNOSIS — A499 Bacterial infection, unspecified: Secondary | ICD-10-CM | POA: Insufficient documentation

## 2012-04-09 DIAGNOSIS — B9689 Other specified bacterial agents as the cause of diseases classified elsewhere: Secondary | ICD-10-CM | POA: Insufficient documentation

## 2012-04-09 LAB — PREGNANCY, URINE: Preg Test, Ur: NEGATIVE

## 2012-04-09 LAB — URINALYSIS, ROUTINE W REFLEX MICROSCOPIC
Bilirubin Urine: NEGATIVE
Nitrite: NEGATIVE
Specific Gravity, Urine: 1.021 (ref 1.005–1.030)
Urobilinogen, UA: 1 mg/dL (ref 0.0–1.0)

## 2012-04-09 LAB — URINE MICROSCOPIC-ADD ON

## 2012-04-09 LAB — WET PREP, GENITAL
Trich, Wet Prep: NONE SEEN
Yeast Wet Prep HPF POC: NONE SEEN

## 2012-04-09 MED ORDER — METRONIDAZOLE 500 MG PO TABS: 500.0000 mg | ORAL_TABLET | Freq: Two times a day (BID) | ORAL | Status: DC

## 2012-04-09 NOTE — ED Notes (Addendum)
Sharp pain in left hip x1 week. Sts she believes it is an ovarian cyst. Denies abd pain. Pt sts she also believes she has a yeast infection and would like to be checked for that.

## 2012-04-09 NOTE — ED Provider Notes (Signed)
Medical screening examination/treatment/procedure(s) were performed by non-physician practitioner and as supervising physician I was immediately available for consultation/collaboration.    Nelia Shi, MD 04/09/12 1754

## 2012-04-09 NOTE — ED Provider Notes (Signed)
History     CSN: [id number]  Arrival date & time 04/09/12  1535   First MD Initiated Contact with Patient 04/09/12 1554      Chief Complaint  Patient presents with  . Hip Pain    (Consider location/radiation/quality/duration/timing/severity/associated sxs/prior treatment) HPI Comments: 26 year old female presents to emergency department with sudden onset left-sided pelvic pain for the past week. She describes the pain as sharp and stabbing. The sharp pains are intermittent worse when she moves in a certain way. She has not tried any alleviating factors for her pain. Admits to associated vaginal burning. Denies any odor, discharge or vaginal bleeding. Denies any dysuria, hematuria or increased frequency. Denies fever, chills, nausea or vomiting. She is sexually active with one partner and admits to not using any protection. She's not on any birth control. Her menses are "never normal" and she rarely ever gets her period.  Patient is a 26 y.o. female presenting with hip pain. The history is provided by the patient.  Hip Pain Pertinent negatives include no chest pain, chills, fever, nausea, vomiting or weakness.    Past Medical History  Diagnosis Date  . Asthma   . Obesity     Past Surgical History  Procedure Date  . Tonsillectomy     Family History  Problem Relation Age of Onset  . Hypothyroidism Mother   . Diabetes Father   . Diabetes Paternal Uncle   . Hypertension Maternal Grandmother   . Hypertension Maternal Grandfather   . Hypertension Paternal Grandmother   . Diabetes Paternal Grandfather   . Heart disease Paternal Grandfather   . Hypertension Paternal Grandfather     History  Substance Use Topics  . Smoking status: Never Smoker   . Smokeless tobacco: Never Used  . Alcohol Use: No    OB History    Grav Para Term Preterm Abortions TAB SAB Ect Mult Living   1 1 1        0      Review of Systems  Constitutional: Negative for fever and chills.    Respiratory: Negative for shortness of breath.   Cardiovascular: Negative for chest pain.  Gastrointestinal: Negative for nausea and vomiting.  Genitourinary: Positive for vaginal pain, menstrual problem, pelvic pain and dyspareunia. Negative for dysuria, frequency, hematuria, flank pain, vaginal bleeding and vaginal discharge.  Musculoskeletal: Negative for back pain.  Skin: Negative for color change.  Neurological: Negative for weakness.    Allergies  Review of patient's allergies indicates no known allergies.  Home Medications   Current Outpatient Rx  Name Route Sig Dispense Refill  . ALBUTEROL SULFATE HFA 108 (90 BASE) MCG/ACT IN AERS Inhalation Inhale 2 puffs into the lungs every 6 (six) hours as needed. For shortness of breath 1 Inhaler 0  . TRAMADOL HCL 50 MG PO TABS Oral Take 1 tablet (50 mg total) by mouth every 6 (six) hours as needed for pain. 15 tablet 0    BP 152/85  Pulse 85  Temp 98 F (36.7 C) (Oral)  Resp 16  Ht 5\' 3"  (1.6 m)  Wt 320 lb 8 oz (145.378 kg)  BMI 56.77 kg/m2  SpO2 100%  LMP 12/08/2011  Physical Exam  Nursing note and vitals reviewed. Constitutional: She is oriented to person, place, and time. She appears well-nourished. No distress.       Morbidly obese  HENT:  Head: Normocephalic and atraumatic.  Eyes: Conjunctivae normal are normal.  Neck: Normal range of motion.  Cardiovascular: Normal rate, regular rhythm and  normal heart sounds.   Pulmonary/Chest: Effort normal and breath sounds normal.  Abdominal: Soft. Normal appearance and bowel sounds are normal. There is tenderness (moreso on left) in the suprapubic area. There is no rigidity, no rebound, no guarding and no CVA tenderness.  Genitourinary: Cervix exhibits discharge. Cervix exhibits no motion tenderness and no friability. Right adnexum displays no mass and no tenderness. Left adnexum displays no mass and no tenderness. There is erythema and tenderness around the vagina. No bleeding  around the vagina. Vaginal discharge (copious, thick yellow and malodorous) found.       Internal pelvic exam difficult to assess due to patient's morbid obesity.  Musculoskeletal: Normal range of motion.  Neurological: She is alert and oriented to person, place, and time.  Skin: Skin is warm and dry. She is not diaphoretic.  Psychiatric: She has a normal mood and affect. Her behavior is normal.    ED Course  Procedures (including critical care time)  Labs Reviewed  URINALYSIS, ROUTINE W REFLEX MICROSCOPIC - Abnormal; Notable for the following:    APPearance CLOUDY (*)     Leukocytes, UA LARGE (*)     All other components within normal limits  PREGNANCY, URINE  WET PREP, GENITAL  GC/CHLAMYDIA PROBE AMP, GENITAL  URINE MICROSCOPIC-ADD ON   US Transvaginal Non-ob  04/09/2012  *RADIOLOGY REPORT*  Clinical Data: Left lower quadrant pain  TRANSABDOMINAL AND TRANSVAGINAL ULTRASOUND OF PELVIS Technique:  Both transabdominal and transvaginal ultrasound examinations of the pelvis were performed. Transabdominal technique was performed for global imaging of the pelvis including uterus, ovaries, adnexal regions, and pelvic cul-de-sac.  It was necessary to proceed with endovaginal exam following the transabdominal exam to visualize the endometrium and ovaries.  Comparison:  None  Findings:  Uterus: Normal in size and echogenicity measuring 5.7 x 2.3 x 4.4 cm.  Endometrium: Normal in thickness at 8.8 mm for premenopausal female.  Right ovary:  Normal in size measuring 2.8 x 1.7 x 2.0 cm.  Left ovary: Normal in size measuring 2.3 x 2.1 x 201 cm.  Other findings: No free fluid  IMPRESSION: Normal pelvic ultrasound.   Original Report Authenticated By: Genevive Bi, M.D.    US Pelvis Complete  04/09/2012  *RADIOLOGY REPORT*  Clinical Data: Left lower quadrant pain  TRANSABDOMINAL AND TRANSVAGINAL ULTRASOUND OF PELVIS Technique:  Both transabdominal and transvaginal ultrasound examinations of the pelvis  were performed. Transabdominal technique was performed for global imaging of the pelvis including uterus, ovaries, adnexal regions, and pelvic cul-de-sac.  It was necessary to proceed with endovaginal exam following the transabdominal exam to visualize the endometrium and ovaries.  Comparison:  None  Findings:  Uterus: Normal in size and echogenicity measuring 5.7 x 2.3 x 4.4 cm.  Endometrium: Normal in thickness at 8.8 mm for premenopausal female.  Right ovary:  Normal in size measuring 2.8 x 1.7 x 2.0 cm.  Left ovary: Normal in size measuring 2.3 x 2.1 x 201 cm.  Other findings: No free fluid  IMPRESSION: Normal pelvic ultrasound.   Original Report Authenticated By: Genevive Bi, M.D.      1. BV (bacterial vaginosis)       MDM  26 year old female with vaginal pain and left-sided pelvic pain. A copious amount of thick, yellow malodorous discharge present on exam. Her pelvic organs are difficult to assess due to patient's morbid obesity. I will obtain a pelvic ultrasound to further assess. 5:51 PM Pelvic ultrasound unremarkable. Wet prep showing clue cells. I will treat her bacterial vaginosis.  Close return precautions discussed. No red flags concerning PID or ovarian torsion.       Trevor Mace, PA-C 04/09/12 1752

## 2012-04-11 LAB — GC/CHLAMYDIA PROBE AMP, GENITAL: Chlamydia, DNA Probe: POSITIVE — AB

## 2012-04-20 ENCOUNTER — Telehealth (HOSPITAL_COMMUNITY): Payer: Self-pay | Admitting: *Deleted

## 2012-04-20 NOTE — ED Notes (Signed)
Patient informed of positive results after id'd x 2 and informed of need to notify partner to be treated. 

## 2012-07-06 ENCOUNTER — Encounter (HOSPITAL_COMMUNITY): Payer: Self-pay

## 2012-07-06 ENCOUNTER — Inpatient Hospital Stay (HOSPITAL_COMMUNITY)
Admission: AD | Admit: 2012-07-06 | Discharge: 2012-07-06 | Disposition: A | Discharge status: Home / Self Care | Payer: Self-pay | Source: Ambulatory Visit | Attending: Obstetrics & Gynecology | Admitting: Obstetrics & Gynecology

## 2012-07-06 DIAGNOSIS — R197 Diarrhea, unspecified: Secondary | ICD-10-CM | POA: Insufficient documentation

## 2012-07-06 DIAGNOSIS — A088 Other specified intestinal infections: Secondary | ICD-10-CM | POA: Insufficient documentation

## 2012-07-06 DIAGNOSIS — R109 Unspecified abdominal pain: Secondary | ICD-10-CM | POA: Insufficient documentation

## 2012-07-06 DIAGNOSIS — R112 Nausea with vomiting, unspecified: Secondary | ICD-10-CM | POA: Insufficient documentation

## 2012-07-06 DIAGNOSIS — A084 Viral intestinal infection, unspecified: Secondary | ICD-10-CM

## 2012-07-06 LAB — POCT PREGNANCY, URINE: Preg Test, Ur: NEGATIVE

## 2012-07-06 LAB — URINALYSIS, ROUTINE W REFLEX MICROSCOPIC
Bilirubin Urine: NEGATIVE
Glucose, UA: NEGATIVE mg/dL
Protein, ur: NEGATIVE mg/dL
Specific Gravity, Urine: >1.03 — ABNORMAL HIGH (ref 1.005–1.030)

## 2012-07-06 LAB — URINE MICROSCOPIC-ADD ON

## 2012-07-06 MED ORDER — ONDANSETRON 8 MG PO TBDP
8.0000 mg | ORAL_TABLET | Freq: Once | ORAL | Status: AC
  Administered 2012-07-06: 8 mg via ORAL
  Filled 2012-07-06: qty 1

## 2012-07-06 MED ORDER — PROMETHAZINE HCL 12.5 MG PO TABS: 25.0000 mg | ORAL_TABLET | Freq: Four times a day (QID) | ORAL | Status: DC | PRN

## 2012-07-06 NOTE — MAU Provider Note (Signed)
  History     CSN: [id number]  Arrival date and time: 07/06/12 2005   First Provider Initiated Contact with Patient 07/06/12 2032      Chief Complaint  Patient presents with  . Emesis  . Diarrhea  . Abdominal Pain   HPI    Past Medical History  Diagnosis Date  . Asthma   . Obesity     Past Surgical History  Procedure Date  . Tonsillectomy     Family History  Problem Relation Age of Onset  . Hypothyroidism Mother   . Diabetes Father   . Diabetes Paternal Uncle   . Hypertension Maternal Grandmother   . Hypertension Maternal Grandfather   . Hypertension Paternal Grandmother   . Diabetes Paternal Grandfather   . Heart disease Paternal Grandfather   . Hypertension Paternal Grandfather     History  Substance Use Topics  . Smoking status: Never Smoker   . Smokeless tobacco: Never Used  . Alcohol Use: No    Allergies: No Known Allergies  Prescriptions prior to admission  Medication Sig Dispense Refill  . albuterol (PROVENTIL HFA;VENTOLIN HFA) 108 (90 BASE) MCG/ACT inhaler Inhale 2 puffs into the lungs every 6 (six) hours as needed. For shortness of breath  1 Inhaler  0  . metroNIDAZOLE (FLAGYL) 500 MG tablet Take 1 tablet (500 mg total) by mouth 2 (two) times daily. One po bid x 7 days  14 tablet  0    Review of Systems  Constitutional: Negative for fever and chills.  Eyes: Negative for blurred vision.  Gastrointestinal: Positive for nausea, vomiting, abdominal pain and diarrhea. Negative for constipation.  Genitourinary: Negative for dysuria, urgency and frequency.  Neurological: Negative for headaches.   Physical Exam   Blood pressure 138/84, pulse 106, temperature 98.4 F (36.9 C), temperature source Oral, resp. rate 18.  Physical Exam  Nursing note and vitals reviewed. Constitutional: She is oriented to person, place, and time. She appears well-developed and well-nourished. No distress.  Cardiovascular: Normal rate.   GI: Soft. She exhibits no  distension. There is no tenderness.  Neurological: She is alert and oriented to person, place, and time.  Skin: Skin is warm and dry.  Psychiatric: She has a normal mood and affect.    MAU Course  Procedures    Assessment and Plan   1. Viral gastroenteritis    Phenergan PRN BRAT diet FU with PCP as needed  Tawnya Crook 07/06/2012, 8:40 PM

## 2012-07-06 NOTE — MAU Note (Signed)
Diarrhea since this morning, vomiting for past 2 hours, abdominal pain, LMP 11/24 lasted 15 days, started spotting earlier today has never had irregular periods.

## 2012-07-08 LAB — URINE CULTURE

## 2012-07-27 ENCOUNTER — Encounter (HOSPITAL_COMMUNITY): Payer: Self-pay | Admitting: Emergency Medicine

## 2012-07-27 ENCOUNTER — Emergency Department (HOSPITAL_COMMUNITY)
Admission: EM | Admit: 2012-07-27 | Discharge: 2012-07-28 | Disposition: A | Discharge status: Home / Self Care | Payer: Self-pay | Attending: Emergency Medicine | Admitting: Emergency Medicine

## 2012-07-27 ENCOUNTER — Emergency Department (HOSPITAL_COMMUNITY): Payer: Self-pay

## 2012-07-27 DIAGNOSIS — R059 Cough, unspecified: Secondary | ICD-10-CM | POA: Insufficient documentation

## 2012-07-27 DIAGNOSIS — J45909 Unspecified asthma, uncomplicated: Secondary | ICD-10-CM | POA: Insufficient documentation

## 2012-07-27 DIAGNOSIS — R05 Cough: Secondary | ICD-10-CM | POA: Insufficient documentation

## 2012-07-27 DIAGNOSIS — IMO0002 Reserved for concepts with insufficient information to code with codable children: Secondary | ICD-10-CM | POA: Insufficient documentation

## 2012-07-27 DIAGNOSIS — E669 Obesity, unspecified: Secondary | ICD-10-CM | POA: Insufficient documentation

## 2012-07-27 NOTE — ED Provider Notes (Signed)
History   This chart was scribed for non-physician practitioner working with [name], MD by Frederik Pear, ED Scribe. This patient was seen in room TR09C/TR09C and the patient's care was started at 2238.   CSN: [id number]  Arrival date & time 07/27/12  2113   First MD Initiated Contact with Patient 07/27/12 2238      Chief Complaint  Patient presents with  . Cough    (Consider location/radiation/quality/duration/timing/severity/associated sxs/prior treatment) Patient is a 27 y.o. female presenting with cough.  Cough This is a new problem. The current episode started yesterday. The problem occurs every few minutes. Progression since onset: Non-productive that became productive.  There has been no fever. Associated symptoms include ear pain and sore throat. Pertinent negatives include no rhinorrhea and no wheezing. Associated symptoms comments: Chest tightness. Treatments tried: albuterol. The treatment provided no relief. Her past medical history is significant for asthma.    Courtney Zamora is a 27 y.o. female who presents to the Emergency Department complaining of constant, moderate, burning chest tightness that began yesterday with an associated cough that was non-productive yesterday that became productive today. She also reports an associated sore throat and ear pain.She reports that she was wheezing yesterday, but the symptom has since resolved. She denies any associated fever or rhinorrhea. She reports that she used her albuterol inhaler at home with no relief. She reports that she recently babysat her niece who had similar symptoms.    Past Medical History  Diagnosis Date  . Asthma   . Obesity     Past Surgical History  Procedure Date  . Tonsillectomy     Family History  Problem Relation Age of Onset  . Hypothyroidism Mother   . Diabetes Father   . Diabetes Paternal Uncle   . Hypertension Maternal Grandmother   . Hypertension Maternal Grandfather   .  Hypertension Paternal Grandmother   . Diabetes Paternal Grandfather   . Heart disease Paternal Grandfather   . Hypertension Paternal Grandfather     History  Substance Use Topics  . Smoking status: Never Smoker   . Smokeless tobacco: Never Used  . Alcohol Use: No    OB History    Grav Para Term Preterm Abortions TAB SAB Ect Mult Living   1    1  1    0      Review of Systems  Constitutional: Negative for fever.  HENT: Positive for ear pain and sore throat. Negative for rhinorrhea.   Respiratory: Positive for cough and chest tightness. Negative for wheezing.   All other systems reviewed and are negative.    Allergies  Review of patient's allergies indicates no known allergies.  Home Medications   Current Outpatient Rx  Name  Route  Sig  Dispense  Refill  . ALBUTEROL SULFATE HFA 108 (90 BASE) MCG/ACT IN AERS   Inhalation   Inhale 2 puffs into the lungs every 6 (six) hours as needed. For shortness of breath   1 Inhaler   0   . GUAIFENESIN ER 600 MG PO TB12   Oral   Take 1,200 mg by mouth 2 (two) times daily.           BP 155/90  Pulse 94  Temp 98.2 F (36.8 C) (Oral)  Resp 16  SpO2 100%  LMP 07/20/2012  Physical Exam  Nursing note and vitals reviewed. Constitutional: She is oriented to person, place, and time. She appears well-developed and well-nourished. No distress.  HENT:  Head: Normocephalic  and atraumatic.  Mouth/Throat: Posterior oropharyngeal erythema present.  Eyes: EOM are normal. Pupils are equal, round, and reactive to light.  Neck: Normal range of motion. Neck supple. No tracheal deviation present.  Cardiovascular: Normal rate.   Pulmonary/Chest: Effort normal and breath sounds normal. No respiratory distress. She has no wheezes. She has no rales. She exhibits no tenderness.  Abdominal: Soft. She exhibits no distension.  Musculoskeletal: Normal range of motion. She exhibits no edema.  Neurological: She is alert and oriented to person,  place, and time.  Skin: Skin is warm and dry.  Psychiatric: She has a normal mood and affect. Her behavior is normal.    ED Course  Procedures (including critical care time)  DIAGNOSTIC STUDIES: Oxygen Saturation is 100% on room air, normal by my interpretation.    COORDINATION OF CARE:   22:48- Discussed planned course of treatment with the patient, including a chest X-ray, who is agreeable at this time.   Labs Reviewed - No data to display No results found.   No diagnosis found.  Cough, likely viral.  No acute findings on chest film.  Patient not wheezing, hypoxic or tachycardic.  Will provide refill of albuterol neb.  MDM   I personally performed the services described in this documentation, which was scribed in my presence. The recorded information has been reviewed and is accurate.       Jimmye Norman, NP 07/28/12 [phone]

## 2012-07-27 NOTE — ED Notes (Signed)
Pt c/o onset of non-productive cough yesterday. St's today  Cough has become productive.  Also c/o feeling like she can't take a deep breath

## 2012-07-28 MED ORDER — AEROCHAMBER PLUS W/MASK MISC
Status: AC
  Administered 2012-07-28: 01:00:00
  Filled 2012-07-28: qty 1

## 2012-07-28 MED ORDER — AEROCHAMBER Z-STAT PLUS/MEDIUM MISC: 1.0000 | Freq: Once | Status: DC

## 2012-07-28 MED ORDER — ALBUTEROL SULFATE HFA 108 (90 BASE) MCG/ACT IN AERS: 1.0000 | INHALATION_SPRAY | Freq: Four times a day (QID) | RESPIRATORY_TRACT | Status: DC | PRN

## 2012-07-29 NOTE — ED Provider Notes (Signed)
Medical screening examination/treatment/procedure(s) were performed by non-physician practitioner and as supervising physician I was immediately available for consultation/collaboration.   Hunter Bachar E Manly Nestle, MD 07/29/12 1514 

## 2012-07-30 ENCOUNTER — Encounter (HOSPITAL_COMMUNITY): Payer: Self-pay | Admitting: Emergency Medicine

## 2012-07-30 ENCOUNTER — Emergency Department (HOSPITAL_COMMUNITY): Payer: Self-pay

## 2012-07-30 ENCOUNTER — Emergency Department (HOSPITAL_COMMUNITY)
Admission: EM | Admit: 2012-07-30 | Discharge: 2012-07-30 | Disposition: A | Discharge status: Home / Self Care | Payer: Self-pay | Attending: Emergency Medicine | Admitting: Emergency Medicine

## 2012-07-30 DIAGNOSIS — Z79899 Other long term (current) drug therapy: Secondary | ICD-10-CM | POA: Insufficient documentation

## 2012-07-30 DIAGNOSIS — J45901 Unspecified asthma with (acute) exacerbation: Secondary | ICD-10-CM | POA: Insufficient documentation

## 2012-07-30 DIAGNOSIS — R0602 Shortness of breath: Secondary | ICD-10-CM | POA: Insufficient documentation

## 2012-07-30 DIAGNOSIS — R6889 Other general symptoms and signs: Secondary | ICD-10-CM | POA: Insufficient documentation

## 2012-07-30 DIAGNOSIS — R059 Cough, unspecified: Secondary | ICD-10-CM | POA: Insufficient documentation

## 2012-07-30 DIAGNOSIS — J069 Acute upper respiratory infection, unspecified: Secondary | ICD-10-CM | POA: Insufficient documentation

## 2012-07-30 DIAGNOSIS — E669 Obesity, unspecified: Secondary | ICD-10-CM | POA: Insufficient documentation

## 2012-07-30 DIAGNOSIS — R062 Wheezing: Secondary | ICD-10-CM | POA: Insufficient documentation

## 2012-07-30 DIAGNOSIS — R05 Cough: Secondary | ICD-10-CM | POA: Insufficient documentation

## 2012-07-30 MED ORDER — BENZONATATE 100 MG PO CAPS: 100.0000 mg | ORAL_CAPSULE | Freq: Three times a day (TID) | ORAL | Status: DC

## 2012-07-30 MED ORDER — METHYLPREDNISOLONE 4 MG PO KIT: PACK | ORAL | Status: DC

## 2012-07-30 MED ORDER — OXYCODONE-ACETAMINOPHEN 5-325 MG PO TABS: ORAL_TABLET | ORAL | Status: DC

## 2012-07-30 MED ORDER — ALBUTEROL SULFATE (5 MG/ML) 0.5% IN NEBU
5.0000 mg | INHALATION_SOLUTION | Freq: Once | RESPIRATORY_TRACT | Status: AC
  Administered 2012-07-30: 5 mg via RESPIRATORY_TRACT
  Filled 2012-07-30: qty 1

## 2012-07-30 NOTE — ED Provider Notes (Signed)
ECG shows normal sinus rhythm with a rate of 88, no ectopy. Normal axis. Normal P wave. Normal QRS. Normal intervals. Normal ST and T waves. Impression: normal ECG. No prior ECG available for comparison.  Medical screening examination/treatment/procedure(s) were performed by non-physician practitioner and as supervising physician I was immediately available for consultation/collaboration.   Dione Booze, MD 07/30/12 [phone]

## 2012-07-30 NOTE — ED Notes (Signed)
Pt c/o chest burning, states it feels like its on fire, states has periods of coughing and unable to catch breath, states pain sometimes is constant and sometimes not, was seen for same 2 weeks ago and states they said nothing was wrong

## 2012-07-30 NOTE — ED Notes (Signed)
Discharge instructions reviewed. Rx given x3. All questions answered. 

## 2012-07-30 NOTE — ED Provider Notes (Signed)
History     CSN: [id number]  Arrival date & time 07/30/12  1251   First MD Initiated Contact with Patient 07/30/12 1531      Chief Complaint  Patient presents with  . Chest Pain    (Consider location/radiation/quality/duration/timing/severity/associated sxs/prior treatment) HPI  Courtney Zamora is a 27 y.o. female complaining of diffuse chest tightness, SOB, productive cough and runny nose x 4 days. Also complains of wheezing due to preexisting asthma for which she has been using her albuterol inhaler every 2 hours. Took tylenol last night and Dayquil this morning, with minimal relief. Denies nausea, vomiting, diarrhea, fever, or myalgia. Has not had flu shot. She's never been intubated or hospitalized for her asthma.      Past Medical History  Diagnosis Date  . Asthma   . Obesity     Past Surgical History  Procedure Date  . Tonsillectomy     Family History  Problem Relation Age of Onset  . Hypothyroidism Mother   . Diabetes Father   . Diabetes Paternal Uncle   . Hypertension Maternal Grandmother   . Hypertension Maternal Grandfather   . Hypertension Paternal Grandmother   . Diabetes Paternal Grandfather   . Heart disease Paternal Grandfather   . Hypertension Paternal Grandfather     History  Substance Use Topics  . Smoking status: Never Smoker   . Smokeless tobacco: Never Used  . Alcohol Use: No    OB History    Grav Para Term Preterm Abortions TAB SAB Ect Mult Living   1    1  1    0      Review of Systems  Constitutional: Negative for fever.  Respiratory: Positive for cough, chest tightness and shortness of breath.   Cardiovascular: Negative for chest pain.  Gastrointestinal: Negative for nausea, vomiting, abdominal pain and diarrhea.  All other systems reviewed and are negative.    Allergies  Review of patient's allergies indicates no known allergies.  Home Medications   Current Outpatient Rx  Name  Route  Sig  Dispense  Refill  .  ALBUTEROL SULFATE HFA 108 (90 BASE) MCG/ACT IN AERS   Inhalation   Inhale 2 puffs into the lungs every 6 (six) hours as needed. For shortness of breath.         . GUAIFENESIN ER 600 MG PO TB12   Oral   Take 600 mg by mouth 2 (two) times daily as needed. For congestion.         . DAYQUIL PO   Oral   Take 30 mLs by mouth every 6 (six) hours as needed. For cough/cold symptoms.           BP 149/68  Pulse 85  Temp 97.7 F (36.5 C) (Oral)  Resp 22  SpO2 100%  LMP 07/20/2012  Physical Exam  Nursing note and vitals reviewed. Constitutional: She is oriented to person, place, and time. She appears well-developed and well-nourished. No distress.  HENT:  Head: Normocephalic and atraumatic.  Mouth/Throat: Oropharynx is clear and moist.  Eyes: Conjunctivae normal and EOM are normal. Pupils are equal, round, and reactive to light.  Neck: Normal range of motion.  Cardiovascular: Normal rate, regular rhythm and intact distal pulses.   Pulmonary/Chest: Effort normal and breath sounds normal. No stridor. No respiratory distress. She has no wheezes. She has no rales. She exhibits no tenderness.       No wheezing, good air movement.  Abdominal: Soft. Bowel sounds are normal. She exhibits no  distension and no mass. There is no tenderness. There is no rebound and no guarding.  Musculoskeletal: Normal range of motion.  Neurological: She is alert and oriented to person, place, and time.  Psychiatric: She has a normal mood and affect.    ED Course  Procedures (including critical care time)  Labs Reviewed - No data to display Dg Chest 2 View  07/30/2012  *RADIOLOGY REPORT*  Clinical Data: Chest pain, cough, shortness of breath, asthma  CHEST - 2 VIEW  Comparison: 07/27/2012  Findings: Normal heart size, mediastinal contours, and pulmonary vascularity. Lungs clear. No pleural effusion or pneumothorax. Bones unremarkable.  IMPRESSION: No acute abnormalities.   Original Report Authenticated By:  Ulyses Southward, M.D.      1. Cough   2. URI (upper respiratory infection)   3. Asthma exacerbation       MDM  Stable vital signs, clear chest x-ray, lung sounds are also clear. Will treat patient for a URI and give her symptomatic Tx.   New Prescriptions   BENZONATATE (TESSALON) 100 MG CAPSULE    Take 1 capsule (100 mg total) by mouth every 8 (eight) hours.   METHYLPREDNISOLONE (MEDROL DOSEPAK) 4 MG TABLET    As directed by package insert   OXYCODONE-ACETAMINOPHEN (PERCOCET/ROXICET) 5-325 MG PER TABLET    1 to 2 tabs PO q6hrs  PRN for pain          Wynetta Emery, PA-C 07/31/12 1447

## 2012-07-31 NOTE — ED Provider Notes (Signed)
Medical screening examination/treatment/procedure(s) were performed by non-physician practitioner and as supervising physician I was immediately available for consultation/collaboration.  Tamika Nou, MD 07/31/12 2117 

## 2012-08-02 NOTE — Progress Notes (Signed)
During 07/30/12 [facility] ED visit noted pt listed with no insurance coverage CM and [facility] spoke with pt.  Pt offered services to assist with finding a [place] self pay provider & health reform information

## 2012-09-16 ENCOUNTER — Emergency Department (HOSPITAL_COMMUNITY)
Admission: EM | Admit: 2012-09-16 | Discharge: 2012-09-17 | Disposition: A | Discharge status: Home / Self Care | Payer: Self-pay | Attending: Emergency Medicine | Admitting: Emergency Medicine

## 2012-09-16 ENCOUNTER — Emergency Department (HOSPITAL_COMMUNITY): Payer: Self-pay

## 2012-09-16 ENCOUNTER — Encounter (HOSPITAL_COMMUNITY): Payer: Self-pay | Admitting: Emergency Medicine

## 2012-09-16 DIAGNOSIS — Y9229 Other specified public building as the place of occurrence of the external cause: Secondary | ICD-10-CM | POA: Insufficient documentation

## 2012-09-16 DIAGNOSIS — IMO0002 Reserved for concepts with insufficient information to code with codable children: Secondary | ICD-10-CM | POA: Insufficient documentation

## 2012-09-16 DIAGNOSIS — E669 Obesity, unspecified: Secondary | ICD-10-CM | POA: Insufficient documentation

## 2012-09-16 DIAGNOSIS — W2203XA Walked into furniture, initial encounter: Secondary | ICD-10-CM | POA: Insufficient documentation

## 2012-09-16 DIAGNOSIS — J45909 Unspecified asthma, uncomplicated: Secondary | ICD-10-CM | POA: Insufficient documentation

## 2012-09-16 DIAGNOSIS — S92919A Unspecified fracture of unspecified toe(s), initial encounter for closed fracture: Secondary | ICD-10-CM | POA: Insufficient documentation

## 2012-09-16 DIAGNOSIS — S92911A Unspecified fracture of right toe(s), initial encounter for closed fracture: Secondary | ICD-10-CM

## 2012-09-16 DIAGNOSIS — Z79899 Other long term (current) drug therapy: Secondary | ICD-10-CM | POA: Insufficient documentation

## 2012-09-16 DIAGNOSIS — Y9389 Activity, other specified: Secondary | ICD-10-CM | POA: Insufficient documentation

## 2012-09-16 MED ORDER — HYDROCODONE-ACETAMINOPHEN 5-325 MG PO TABS
1.0000 | ORAL_TABLET | Freq: Once | ORAL | Status: AC
  Administered 2012-09-16: 1 via ORAL
  Filled 2012-09-16: qty 1

## 2012-09-16 MED ORDER — HYDROCODONE-ACETAMINOPHEN 5-325 MG PO TABS: 1.0000 | ORAL_TABLET | Freq: Four times a day (QID) | ORAL | Status: DC | PRN

## 2012-09-16 MED ORDER — ONDANSETRON 4 MG PO TBDP
4.0000 mg | ORAL_TABLET | Freq: Once | ORAL | Status: AC
  Administered 2012-09-16: 4 mg via ORAL
  Filled 2012-09-16: qty 1

## 2012-09-16 NOTE — ED Notes (Signed)
Pt currently lying on floor, A & O. Per PA pt states pain severe and she felt as if she was going to "pass out" so she laid on floor. Pt unwilling to sit in chair at this time. Pt did not fall, reports no LOC. Pt does not want pain injection offered by PA, stating this would make her "have a pass out seizure" pt given requested ammonia inhalant.

## 2012-09-16 NOTE — ED Provider Notes (Signed)
History    This chart was scribed for non-physician practitioner working with [name], MD by Melba Coon, ED Scribe. This patient was seen in room WTR6/WTR6 and the patient's care was started at 10:56PM.    CSN: [id number]  Arrival date & time 09/16/12  2229   None     Chief Complaint  Patient presents with  . Toe Pain    (Consider location/radiation/quality/duration/timing/severity/associated sxs/prior treatment) The history is provided by the patient. No language interpreter was used.   Courtney Zamora is a 27 y.o. female who presents to the Emergency Department complaining of constant, moderate to severe right 5th toe pain and swelling with an onset this morning that has worsened over the course of the day due to multiple injuries. She reports that she struck her toe on a church pew twice, and it was also stepped on by a large dog this evening around 9:30PM. She reports that ambulation is decreased compared to baseline secondary to pain. She has been wearing sandals all day with a strap that may have been constricting blood flow to the injured toe. Swelling increased after she took her shoe off. Denies HA, fever, neck pain, sore throat, rash, back pain, CP, SOB, abdominal pain, nausea, emesis, diarrhea, dysuria, or extremity weakness, numbness, or tingling. No known allergies to medications. No other pertinent medical symptoms.  Past Medical History  Diagnosis Date  . Asthma   . Obesity     Past Surgical History  Procedure Laterality Date  . Tonsillectomy      Family History  Problem Relation Age of Onset  . Hypothyroidism Mother   . Diabetes Father   . Diabetes Paternal Uncle   . Hypertension Maternal Grandmother   . Hypertension Maternal Grandfather   . Hypertension Paternal Grandmother   . Diabetes Paternal Grandfather   . Heart disease Paternal Grandfather   . Hypertension Paternal Grandfather     History  Substance Use Topics  . Smoking status:  Never Smoker   . Smokeless tobacco: Never Used  . Alcohol Use: No    OB History   Grav Para Term Preterm Abortions TAB SAB Ect Mult Living   1    1  1    0      Review of Systems 10 Systems reviewed and all are negative for acute change except as noted in the HPI.   Allergies  Cinnamon  Home Medications   Current Outpatient Rx  Name  Route  Sig  Dispense  Refill  . acetaminophen (TYLENOL) 500 MG tablet   Oral   Take 1,000 mg by mouth every 6 (six) hours as needed for pain.         Marland Kitchen albuterol (PROVENTIL HFA;VENTOLIN HFA) 108 (90 BASE) MCG/ACT inhaler   Inhalation   Inhale 2 puffs into the lungs every 6 (six) hours as needed. For shortness of breath.         Marland Kitchen HYDROcodone-acetaminophen (NORCO/VICODIN) 5-325 MG per tablet   Oral   Take 1-2 tablets by mouth every 6 (six) hours as needed for pain.   12 tablet   0     BP 128/62  Pulse 87  Temp(Src) 98.7 F (37.1 C) (Oral)  Resp 20  SpO2 98%  LMP 09/14/2012  Physical Exam  Nursing note and vitals reviewed. Constitutional: She is oriented to person, place, and time. She appears well-developed.  HENT:  Head: Normocephalic.  Eyes: Conjunctivae are normal.  Neck: No tracheal deviation present.  Cardiovascular:  No  murmur heard. Musculoskeletal: Normal range of motion. She exhibits edema and tenderness.  Right foot, edematous 5th digit, is TTP without obvious deformity with decreased ROM secondary to pain and is NV intact.  Neurological: She is oriented to person, place, and time.  Skin: Skin is warm.  Psychiatric: She has a normal mood and affect.    ED Course  Procedures (including critical care time)  DIAGNOSTIC STUDIES: Oxygen Saturation is 98% on room air, normal by my interpretation.    COORDINATION OF CARE:  11:00PM - right 5th toe XR, Vicodin, and Zofran will be ordered for Courtney Zamora along with applied ice to the wound.  11:30PM - imaging results reviewed and shows a small mildly  displaced fracture to the right 5th toe. Crutches and post op boot will be given to the pt. 11:35PM - pt was made aware of her fracture and became overwhelmed about the news of her first fracture that she slid against the wall without head contact. She is now laying on the floor and keeps saying that she "is about to pass out". Her friend reports that this is a normal reaction whenever she becomes too emotional or hurt. Pt states that it is best to leave her on the floor and that ammonium pearl usually alleviates her near-syncope. Ammonium pearl will be ordered. 11:50PM - she is now off the floor and is aware of time, location, and person while speaking in complete sentences. She will be monitored a little while longer to make sure she is stable and she will be ready for d/c. NO syncopal episode during stay. She said the Ammonium Pearl alleviated her symptoms and she is up on her feet again as normal.  Labs Reviewed - No data to display Dg Toe 5th Right  09/16/2012  *RADIOLOGY REPORT*  Clinical Data: Dropped object on right little toe; fifth toe bruising and pain.  RIGHT FIFTH TOE  Comparison: None.  Findings: There is a small mildly displaced fracture involving the medial aspect of the base of the fused fifth middle and distal phalanx.  No additional fractures are seen.  No radiopaque foreign bodies are identified.  Mild soft tissue swelling is noted at the fifth toe.  IMPRESSION: Small mildly displaced fracture involving the medial aspect of the base of the fused fifth middle and distal phalanx.  No radiopaque foreign bodies seen.   Original Report Authenticated By: Tonia Ghent, M.D.      1. Toe fracture, right, closed, initial encounter       MDM  I personally performed the services described in this documentation, which was scribed in my presence. The recorded information has been reviewed and is accurate.       Dorthula Matas, PA 09/17/12 0001

## 2012-09-16 NOTE — ED Notes (Signed)
Pt 5th toe on right foot struck church pew and was stepped on by large dog.VSS

## 2012-09-18 NOTE — ED Provider Notes (Signed)
Medical screening examination/treatment/procedure(s) were performed by non-physician practitioner and as supervising physician I was immediately available for consultation/collaboration.   Kevin E Steinl, MD 09/18/12 0724 

## 2012-09-24 ENCOUNTER — Encounter (HOSPITAL_COMMUNITY): Payer: Self-pay | Admitting: Emergency Medicine

## 2012-09-24 ENCOUNTER — Emergency Department (HOSPITAL_COMMUNITY)
Admission: EM | Admit: 2012-09-24 | Discharge: 2012-09-24 | Disposition: A | Discharge status: Home / Self Care | Payer: Self-pay | Attending: Emergency Medicine | Admitting: Emergency Medicine

## 2012-09-24 ENCOUNTER — Emergency Department (HOSPITAL_COMMUNITY): Payer: Self-pay

## 2012-09-24 DIAGNOSIS — J45909 Unspecified asthma, uncomplicated: Secondary | ICD-10-CM | POA: Insufficient documentation

## 2012-09-24 DIAGNOSIS — Z3202 Encounter for pregnancy test, result negative: Secondary | ICD-10-CM | POA: Insufficient documentation

## 2012-09-24 DIAGNOSIS — E669 Obesity, unspecified: Secondary | ICD-10-CM | POA: Insufficient documentation

## 2012-09-24 DIAGNOSIS — Y939 Activity, unspecified: Secondary | ICD-10-CM | POA: Insufficient documentation

## 2012-09-24 DIAGNOSIS — W2209XA Striking against other stationary object, initial encounter: Secondary | ICD-10-CM | POA: Insufficient documentation

## 2012-09-24 DIAGNOSIS — S92501S Displaced unspecified fracture of right lesser toe(s), sequela: Secondary | ICD-10-CM

## 2012-09-24 DIAGNOSIS — S92919A Unspecified fracture of unspecified toe(s), initial encounter for closed fracture: Secondary | ICD-10-CM | POA: Insufficient documentation

## 2012-09-24 DIAGNOSIS — Y9229 Other specified public building as the place of occurrence of the external cause: Secondary | ICD-10-CM | POA: Insufficient documentation

## 2012-09-24 NOTE — ED Provider Notes (Signed)
History    This chart was scribed for non-physician practitioner working with Courtney Co, MD by ED Scribe, Burman Nieves. This patient was seen in room WTR9/WTR9 and the patient's care was started at 7:48 PM.   CSN: [id number]  Arrival date & time 09/24/12  6578   First MD Initiated Contact with Patient 09/24/12 1948      No chief complaint on file.   (Consider location/radiation/quality/duration/timing/severity/associated sxs/prior treatment) Patient is a 27 y.o. female presenting with foot injury. The history is provided by the patient. No language interpreter was used.  Foot Injury Pain details:    Severity:  Moderate   Onset quality:  Gradual   Duration:  8 days   Timing:  Constant   Progression:  Unchanged Dislocation: no   Worsened by:  Bearing weight Associated symptoms: no fatigue    Courtney Zamora is a 27 y.o. female who presents to the Emergency Department complaining of moderate constant pain in toe resulting from breaking right pinky toe onset 8 days ago . Pt was at church on September 23, 2012 when she struck her foot on a church pew injuring her right pinky toe. Also the same day it was stepped on by a large dog later that evening. Pt was given a boot for support which helped with pain until a couple hours ago when pt started to complain that her right pinky toe had a "grinding clicking" sensation. Pt was seen in ED 8 days ago when x-rays were taken for broken toe. X-ray results showed a fracture in her right toe. She is able to ambulate on right foot. . Pt denies fever, chills, cough, nausea, vomiting, diarrhea, SOB, numbness, weakness, and any other associated symptoms.   Past Medical History  Diagnosis Date  . Asthma   . Obesity     Past Surgical History  Procedure Laterality Date  . Tonsillectomy      Family History  Problem Relation Age of Onset  . Hypothyroidism Mother   . Diabetes Father   . Diabetes Paternal Uncle   . Hypertension Maternal  Grandmother   . Hypertension Maternal Grandfather   . Hypertension Paternal Grandmother   . Diabetes Paternal Grandfather   . Heart disease Paternal Grandfather   . Hypertension Paternal Grandfather     History  Substance Use Topics  . Smoking status: Never Smoker   . Smokeless tobacco: Never Used  . Alcohol Use: No    OB History   Grav Para Term Preterm Abortions TAB SAB Ect Mult Living   1    1  1    0      Review of Systems  Constitutional: Negative for chills and fatigue.  Musculoskeletal: Positive for arthralgias.  All other systems reviewed and are negative.    Allergies  Cinnamon  Home Medications   Current Outpatient Rx  Name  Route  Sig  Dispense  Refill  . acetaminophen (TYLENOL) 500 MG tablet   Oral   Take 1,000 mg by mouth every 6 (six) hours as needed for pain.         Marland Kitchen albuterol (PROVENTIL HFA;VENTOLIN HFA) 108 (90 BASE) MCG/ACT inhaler   Inhalation   Inhale 2 puffs into the lungs every 6 (six) hours as needed. For shortness of breath.         Marland Kitchen HYDROcodone-acetaminophen (NORCO/VICODIN) 5-325 MG per tablet   Oral   Take 1-2 tablets by mouth every 6 (six) hours as needed for pain.   12 tablet  0     BP 145/72  Pulse 82  Temp(Src) 98.4 F (36.9 C) (Oral)  Resp 16  Ht 5\' 4"  (1.626 m)  SpO2 100%  LMP 08/14/2012  Physical Exam  Nursing note and vitals reviewed. Constitutional: She appears well-developed and well-nourished.  HENT:  Head: Normocephalic and atraumatic.  Mouth/Throat: Oropharynx is clear and moist.  Eyes: EOM are normal. Pupils are equal, round, and reactive to light.  Neck: Normal range of motion. Neck supple.  Cardiovascular: Normal rate, regular rhythm and normal heart sounds.   Pulmonary/Chest: Effort normal and breath sounds normal. She has no wheezes.  Abdominal: Soft. There is no tenderness.  Musculoskeletal: Normal range of motion.  Tenderness upon palpation on right foot at the base of the 5th toe.  Mild  bruising at the base of the 5th toe.  Pain with ROM of the 5th right toe  Neurological: She is alert. No sensory deficit. Gait normal.  Skin: Skin is warm and dry.  Psychiatric: She has a normal mood and affect. Her behavior is normal.    ED Course  Procedures (including critical care time) DIAGNOSTIC STUDIES:    COORDINATION OF CARE:  8:09 PM Discussed ED treatment with pt and pt agrees.    Labs Reviewed - No data to display Dg Toe 5th Right  09/24/2012  *RADIOLOGY REPORT*  Clinical Data: Right fifth toe fracture, recurrent injury  RIGHT FIFTH TOE  Comparison: 09/16/2012  Findings: Nondisplaced fracture identified at base distal phalanx on the previous exam is grossly unchanged. Osseous mineralization normal. Joint spaces preserved. No additional fracture or dislocation identified.  IMPRESSION: No interval change in appearance of nondisplaced fracture at the base of the distal phalanx right fifth toe.   Original Report Authenticated By: Ulyses Southward, M.D.      No diagnosis found.    MDM  Patient presenting with pain of her 5th toe.  Seen in the ED 8 days ago and diagnosed with a fracture of the base of the 5th toe.  She has been wearing a post op shoe since that time.  She reports that she began having pain acutely today.  Xray does not show any interval change in appearance from previous xray.  Patient instructed to continue wearing post op shoe.    I personally performed the services described in this documentation, which was scribed in my presence. The recorded information has been reviewed and is accurate.    Pascal Lux [place], PA-C 09/25/12 1512

## 2012-09-24 NOTE — ED Notes (Signed)
Per Patient, she hit her 5th toe repeatedly on her right foot.

## 2012-09-26 NOTE — ED Provider Notes (Signed)
Medical screening examination/treatment/procedure(s) were performed by non-physician practitioner and as supervising physician I was immediately available for consultation/collaboration.   [name], MD 09/26/12 1501

## 2012-11-08 ENCOUNTER — Emergency Department (HOSPITAL_COMMUNITY)
Admission: EM | Admit: 2012-11-08 | Discharge: 2012-11-09 | Disposition: A | Discharge status: Home / Self Care | Payer: Self-pay | Attending: Emergency Medicine | Admitting: Emergency Medicine

## 2012-11-08 DIAGNOSIS — R059 Cough, unspecified: Secondary | ICD-10-CM | POA: Insufficient documentation

## 2012-11-08 DIAGNOSIS — Z79899 Other long term (current) drug therapy: Secondary | ICD-10-CM | POA: Insufficient documentation

## 2012-11-08 DIAGNOSIS — J45901 Unspecified asthma with (acute) exacerbation: Secondary | ICD-10-CM | POA: Insufficient documentation

## 2012-11-08 DIAGNOSIS — R05 Cough: Secondary | ICD-10-CM | POA: Insufficient documentation

## 2012-11-08 DIAGNOSIS — R0789 Other chest pain: Secondary | ICD-10-CM | POA: Insufficient documentation

## 2012-11-08 DIAGNOSIS — Z77098 Contact with and (suspected) exposure to other hazardous, chiefly nonmedicinal, chemicals: Secondary | ICD-10-CM | POA: Insufficient documentation

## 2012-11-09 ENCOUNTER — Encounter (HOSPITAL_COMMUNITY): Payer: Self-pay | Admitting: *Deleted

## 2012-11-09 ENCOUNTER — Emergency Department (HOSPITAL_COMMUNITY): Payer: Self-pay

## 2012-11-09 MED ORDER — SODIUM CHLORIDE 3 % IN NEBU
15.0000 mL | INHALATION_SOLUTION | Freq: Once | RESPIRATORY_TRACT | Status: AC
Start: 2012-11-09 — End: 2012-11-09
  Administered 2012-11-09: 15 mL via RESPIRATORY_TRACT

## 2012-11-09 MED ORDER — ALBUTEROL SULFATE (5 MG/ML) 0.5% IN NEBU
5.0000 mg | INHALATION_SOLUTION | Freq: Once | RESPIRATORY_TRACT | Status: AC
  Administered 2012-11-09: 5 mg via RESPIRATORY_TRACT
  Filled 2012-11-09: qty 1

## 2012-11-09 MED ORDER — IPRATROPIUM BROMIDE 0.02 % IN SOLN
0.5000 mg | Freq: Once | RESPIRATORY_TRACT | Status: AC
  Administered 2012-11-09: 0.5 mg via RESPIRATORY_TRACT
  Filled 2012-11-09: qty 2.5

## 2012-11-09 MED ORDER — SODIUM CHLORIDE 0.9 % IN NEBU: 3.0000 mL | INHALATION_SOLUTION | Freq: Three times a day (TID) | RESPIRATORY_TRACT | Status: DC | PRN

## 2012-11-09 NOTE — ED Provider Notes (Signed)
Medical screening examination/treatment/procedure(s) were performed by non-physician practitioner and as supervising physician I was immediately available for consultation/collaboration.  Sunnie Nielsen, MD 11/09/12 [phone]
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
## 2012-11-09 NOTE — ED Notes (Signed)
Spoke with poison control and was informed to give pt a saline neb tx and for pt to periodically swish and spit with h2o. Pt to due breathing tx for about 15-20 minutes. MD informed

## 2013-02-03 ENCOUNTER — Emergency Department (HOSPITAL_BASED_OUTPATIENT_CLINIC_OR_DEPARTMENT_OTHER): Payer: Self-pay

## 2013-02-03 ENCOUNTER — Encounter (HOSPITAL_BASED_OUTPATIENT_CLINIC_OR_DEPARTMENT_OTHER): Payer: Self-pay | Admitting: *Deleted

## 2013-02-03 ENCOUNTER — Emergency Department (HOSPITAL_BASED_OUTPATIENT_CLINIC_OR_DEPARTMENT_OTHER)
Admission: EM | Admit: 2013-02-03 | Discharge: 2013-02-03 | Disposition: A | Discharge status: Home / Self Care | Payer: Self-pay | Attending: Emergency Medicine | Admitting: Emergency Medicine

## 2013-02-03 DIAGNOSIS — R059 Cough, unspecified: Secondary | ICD-10-CM | POA: Insufficient documentation

## 2013-02-03 DIAGNOSIS — J45901 Unspecified asthma with (acute) exacerbation: Secondary | ICD-10-CM | POA: Insufficient documentation

## 2013-02-03 DIAGNOSIS — Z3202 Encounter for pregnancy test, result negative: Secondary | ICD-10-CM | POA: Insufficient documentation

## 2013-02-03 DIAGNOSIS — R05 Cough: Secondary | ICD-10-CM | POA: Insufficient documentation

## 2013-02-03 DIAGNOSIS — J4531 Mild persistent asthma with (acute) exacerbation: Secondary | ICD-10-CM

## 2013-02-03 MED ORDER — PREDNISONE 20 MG PO TABS: 40.0000 mg | ORAL_TABLET | Freq: Every day | ORAL | Status: DC

## 2013-02-03 MED ORDER — ALBUTEROL SULFATE (5 MG/ML) 0.5% IN NEBU
5.0000 mg | INHALATION_SOLUTION | Freq: Once | RESPIRATORY_TRACT | Status: AC
  Administered 2013-02-03: 5 mg via RESPIRATORY_TRACT
  Filled 2013-02-03: qty 1

## 2013-02-03 MED ORDER — IPRATROPIUM BROMIDE 0.02 % IN SOLN
0.5000 mg | Freq: Once | RESPIRATORY_TRACT | Status: AC
  Administered 2013-02-03: 0.5 mg via RESPIRATORY_TRACT
  Filled 2013-02-03: qty 2.5

## 2013-02-03 MED ORDER — PREDNISONE 50 MG PO TABS
60.0000 mg | ORAL_TABLET | Freq: Once | ORAL | Status: AC
  Administered 2013-02-03: 60 mg via ORAL
  Filled 2013-02-03: qty 1

## 2013-02-03 NOTE — ED Notes (Signed)
RT assessed patient at triage. BBS clear. No distress noted. SAT 100% on RA. Will continue to monitor.

## 2013-02-03 NOTE — ED Provider Notes (Signed)
History    This chart was scribed for Courtney Sprout, MD by Quintella Reichert, ED scribe.  This patient was seen in room MH03/MH03 and the patient's care was started at 3:12 PM.   CSN: [id number]  Arrival date & time 02/03/13  1316    Chief Complaint  Patient presents with  . Asthma    The history is provided by the patient. No language interpreter was used.    HPI Comments: Courtney Zamora is a 27 y.o. female who presents to the Emergency Department complaining of 3 days of intermittent, progressively-worsening, moderate asthma exacerbation symptoms including wheezing, SOB, and dry cough.  Symptoms were not alleviated by albuterol inhaler or a hot shower.  She last used her inhaler last night.  She describes cough as "violent" but non-productive.  She denies fever, rhinorrhea, congestion.  Pt was not given breathing treatment prior to evaluation.   Past Medical History  Diagnosis Date  . Asthma   . Obesity     Past Surgical History  Procedure Laterality Date  . Tonsillectomy      Family History  Problem Relation Age of Onset  . Hypothyroidism Mother   . Diabetes Father   . Diabetes Paternal Uncle   . Hypertension Maternal Grandmother   . Hypertension Maternal Grandfather   . Hypertension Paternal Grandmother   . Diabetes Paternal Grandfather   . Heart disease Paternal Grandfather   . Hypertension Paternal Grandfather     History  Substance Use Topics  . Smoking status: Never Smoker   . Smokeless tobacco: Never Used  . Alcohol Use: No    OB History   Grav Para Term Preterm Abortions TAB SAB Ect Mult Living   1    1  1    0      Review of Systems A complete 10 system review of systems was obtained and all systems are negative except as noted in the HPI and PMH.    Allergies  Cinnamon  Home Medications   Current Outpatient Rx  Name  Route  Sig  Dispense  Refill  . acetaminophen (TYLENOL) 500 MG tablet   Oral   Take 1,000 mg by mouth every 6  (six) hours as needed for pain.         Marland Kitchen albuterol (PROVENTIL HFA;VENTOLIN HFA) 108 (90 BASE) MCG/ACT inhaler   Inhalation   Inhale 2 puffs into the lungs every 6 (six) hours as needed. For shortness of breath.          BP 154/77  Pulse 108  Temp(Src) 98.6 F (37 C) (Oral)  Resp 20  Ht 5\' 4"  (1.626 m)  Wt 317 lb (143.79 kg)  BMI 54.39 kg/m2  SpO2 100%  Physical Exam  Nursing note and vitals reviewed. Constitutional: She is oriented to person, place, and time. She appears well-developed and well-nourished. No distress.  HENT:  Head: Normocephalic and atraumatic.  Right Ear: External ear normal.  Left Ear: External ear normal.  Nose: Nose normal.  Mouth/Throat: Oropharynx is clear and moist. No oropharyngeal exudate.  Eyes: Conjunctivae and EOM are normal.  Neck: Normal range of motion. Neck supple. No tracheal deviation present.  Cardiovascular: Normal rate, regular rhythm and normal heart sounds.   No murmur heard. Pulmonary/Chest: Effort normal. No respiratory distress. She has wheezes. She has no rales.  Wheezes in left lower lung fields.  Abdominal:  Morbidly obese  Musculoskeletal: Normal range of motion.  Neurological: She is alert and oriented to person, place, and time.  Skin: Skin is warm and dry. She is not diaphoretic.  Psychiatric: She has a normal mood and affect. Her behavior is normal.    ED Course  Procedures (including critical care time)  DIAGNOSTIC STUDIES: Oxygen Saturation is 100% on room air, normal by my interpretation.    COORDINATION OF CARE: 3:15 PM-Discussed treatment plan which includes breathing treatment and steroids with pt at bedside and pt agreed to plan.     Labs Reviewed  PREGNANCY, URINE   Dg Chest 2 View  02/03/2013   *RADIOLOGY REPORT*  Clinical Data: Shortness of breath.  CHEST - 2 VIEW  Comparison: PA and lateral chest 11/09/2012.  Findings: Lungs are clear.  Heart size is normal.  No pneumothorax or pleural fluid.   IMPRESSION: Negative chest.   Original Report Authenticated By: Holley Dexter, M.D.   1. Asthma exacerbation, mild persistent     MDM   Pt with typical asthma exacerbation  symptoms.  No infectious sx, productive cough or other complaints.  Wheezing on exam.  will give steroids, albuterol/atrovent and recheck.  Pt felt much better after treatment.  D/ced home with steroids and albuterol.    I personally performed the services described in this documentation, which was scribed in my presence.  The recorded information has been reviewed and considered.    Courtney Sprout, MD 02/03/13 2256

## 2013-02-03 NOTE — ED Notes (Signed)
Patient with history of asthma and had an attack last night for about two hours.  Patient is 100% on room air.  No respiratory distress.  Patient took her pro-air last night and states that it didn't work.

## 2013-02-14 ENCOUNTER — Emergency Department (HOSPITAL_COMMUNITY)
Admission: EM | Admit: 2013-02-14 | Discharge: 2013-02-14 | Disposition: A | Discharge status: Home / Self Care | Payer: Self-pay | Attending: Emergency Medicine | Admitting: Emergency Medicine

## 2013-02-14 ENCOUNTER — Encounter (HOSPITAL_COMMUNITY): Payer: Self-pay

## 2013-02-14 ENCOUNTER — Emergency Department (HOSPITAL_COMMUNITY): Payer: Self-pay

## 2013-02-14 DIAGNOSIS — J45901 Unspecified asthma with (acute) exacerbation: Secondary | ICD-10-CM

## 2013-02-14 DIAGNOSIS — E669 Obesity, unspecified: Secondary | ICD-10-CM | POA: Insufficient documentation

## 2013-02-14 DIAGNOSIS — H9209 Otalgia, unspecified ear: Secondary | ICD-10-CM | POA: Insufficient documentation

## 2013-02-14 DIAGNOSIS — R05 Cough: Secondary | ICD-10-CM | POA: Insufficient documentation

## 2013-02-14 DIAGNOSIS — R059 Cough, unspecified: Secondary | ICD-10-CM | POA: Insufficient documentation

## 2013-02-14 DIAGNOSIS — J45909 Unspecified asthma, uncomplicated: Secondary | ICD-10-CM | POA: Insufficient documentation

## 2013-02-14 DIAGNOSIS — J40 Bronchitis, not specified as acute or chronic: Secondary | ICD-10-CM

## 2013-02-14 HISTORY — DX: Unspecified convulsions: R56.9

## 2013-02-14 LAB — RAPID STREP SCREEN (MED CTR MEBANE ONLY): Streptococcus, Group A Screen (Direct): NEGATIVE

## 2013-02-14 MED ORDER — AMOXICILLIN 500 MG PO CAPS: 500.0000 mg | ORAL_CAPSULE | Freq: Three times a day (TID) | ORAL | Status: DC

## 2013-02-14 MED ORDER — HYDROCODONE-ACETAMINOPHEN 5-325 MG PO TABS
2.0000 | ORAL_TABLET | Freq: Once | ORAL | Status: AC
  Administered 2013-02-14: 2 via ORAL
  Filled 2013-02-14: qty 2

## 2013-02-14 MED ORDER — KETOROLAC TROMETHAMINE 60 MG/2ML IM SOLN: 60.0000 mg | Freq: Once | INTRAMUSCULAR | Status: DC

## 2013-02-14 MED ORDER — ALBUTEROL SULFATE (5 MG/ML) 0.5% IN NEBU
5.0000 mg | INHALATION_SOLUTION | Freq: Once | RESPIRATORY_TRACT | Status: AC
  Administered 2013-02-14: 5 mg via RESPIRATORY_TRACT
  Filled 2013-02-14: qty 1

## 2013-02-14 MED ORDER — ACETAMINOPHEN-CODEINE #3 300-30 MG PO TABS: 1.0000 | ORAL_TABLET | Freq: Four times a day (QID) | ORAL | Status: DC | PRN

## 2013-02-14 MED ORDER — IBUPROFEN 200 MG PO TABS
600.0000 mg | ORAL_TABLET | Freq: Once | ORAL | Status: DC
  Filled 2013-02-14: qty 3

## 2013-02-14 MED ORDER — IPRATROPIUM BROMIDE 0.02 % IN SOLN
0.5000 mg | Freq: Once | RESPIRATORY_TRACT | Status: AC
  Administered 2013-02-14: 0.5 mg via RESPIRATORY_TRACT
  Filled 2013-02-14: qty 5

## 2013-02-14 MED ORDER — ONDANSETRON 8 MG PO TBDP
8.0000 mg | ORAL_TABLET | Freq: Once | ORAL | Status: AC
  Administered 2013-02-14: 8 mg via ORAL
  Filled 2013-02-14: qty 1

## 2013-02-14 NOTE — ED Provider Notes (Signed)
CSN: [id number]     Arrival date & time 02/14/13  1722 History     First MD Initiated Contact with Patient 02/14/13 1741     Chief Complaint  Patient presents with  . Shortness of Breath   (Consider location/radiation/quality/duration/timing/severity/associated sxs/prior Treatment) HPI Comments: Patient is a 27 year old female with history of asthma who presents today with 3 weeks of worsening cough, shortness of breath. The cough is productive with thick green sputum and worse at night. She is also having a sharp right ear pain. She was evaluated for this at [facility] one week ago and given a prednisone taper. She completed the prednisone. She still has been using her inhaler multiple times throughout the day every day. She has not had a flare like this of her asthma since 2005. She has never been intubated for her asthma. She believes that her asthma was triggered by housesitting for a woman with 2 cats. She is very allergic to cats. She has not been in the house since 7/27. No fevers, chills, nausea, vomiting, abdominal pain.  The history is provided by the patient. No language interpreter was used.    Past Medical History  Diagnosis Date  . Asthma   . Obesity   . Seizures    Past Surgical History  Procedure Laterality Date  . Tonsillectomy     Family History  Problem Relation Age of Onset  . Hypothyroidism Mother   . Diabetes Father   . Diabetes Paternal Uncle   . Hypertension Maternal Grandmother   . Hypertension Maternal Grandfather   . Hypertension Paternal Grandmother   . Diabetes Paternal Grandfather   . Heart disease Paternal Grandfather   . Hypertension Paternal Grandfather    History  Substance Use Topics  . Smoking status: Never Smoker   . Smokeless tobacco: Never Used  . Alcohol Use: No   OB History   Grav Para Term Preterm Abortions TAB SAB Ect Mult Living   1    1  1    0     Review of Systems  Constitutional: Negative for fever and chills.   HENT: Positive for ear pain.   Respiratory: Positive for cough and shortness of breath.   All other systems reviewed and are negative.    Allergies  Cinnamon  Home Medications   Current Outpatient Rx  Name  Route  Sig  Dispense  Refill  . acetaminophen (TYLENOL) 500 MG tablet   Oral   Take 1,000 mg by mouth every 6 (six) hours as needed for pain.         Marland Kitchen albuterol (PROVENTIL HFA;VENTOLIN HFA) 108 (90 BASE) MCG/ACT inhaler   Inhalation   Inhale 2 puffs into the lungs every 6 (six) hours as needed. For shortness of breath.          BP 108/63  Temp(Src) 98.6 F (37 C) (Oral)  Resp 18  Ht 5\' 4"  (1.626 m)  Wt 317 lb (143.79 kg)  BMI 54.39 kg/m2  SpO2 97% Physical Exam  Nursing note and vitals reviewed. Constitutional: She is oriented to person, place, and time. She appears well-developed and well-nourished. She does not appear ill. No distress.  obese  HENT:  Head: Normocephalic and atraumatic. No trismus in the jaw.  Right Ear: Tympanic membrane, external ear and ear canal normal.  Left Ear: Tympanic membrane, external ear and ear canal normal.  Nose: Nose normal. Right sinus exhibits no maxillary sinus tenderness and no frontal sinus tenderness. Left sinus  exhibits no maxillary sinus tenderness and no frontal sinus tenderness.  Mouth/Throat: Uvula is midline, oropharynx is clear and moist and mucous membranes are normal.  Eyes: Conjunctivae are normal.  Neck: Normal range of motion.  Cardiovascular: Normal rate, regular rhythm and normal heart sounds.   Pulmonary/Chest: Effort normal and breath sounds normal. No stridor. No respiratory distress. She has no wheezes. She has no rales.  Abdominal: Soft. She exhibits no distension.  Musculoskeletal: Normal range of motion.  Neurological: She is alert and oriented to person, place, and time. She has normal strength.  Skin: Skin is warm and dry. She is not diaphoretic. No erythema.  Psychiatric: She has a normal mood  and affect. Her behavior is normal.    ED Course   Procedures (including critical care time)  Labs Reviewed  RAPID STREP SCREEN  CULTURE, GROUP A STREP   Dg Chest 2 View  02/14/2013   *RADIOLOGY REPORT*  Clinical Data:  Shortness of breath, chest pain and cough.  CHEST - 2 VIEW  Comparison: 02/03/2013  Findings: The heart size and mediastinal contours are within normal limits.  Both lungs are clear.  The visualized skeletal structures are unremarkable.  IMPRESSION: No active disease.   Original Report Authenticated By: Irish Lack, M.D.   1. Bronchitis   2. Asthma attack     MDM  Patient presents for cough and shortness of breath. History of asthma. She has already finished a course of steroids with no improvement. Due to symptoms worsening over 3 weeks she was given amoxicillin. Tylenol with codeine for the cough. Oxygen saturations remain at 97% on room air. Lungs CTA. Patient reports improvement after breathing treatment. Return instructions given. Vital signs stable for discharge. Patient / Family / Caregiver informed of clinical course, understand medical decision-making process, and agree with plan.   Mora Bellman, PA-C 02/14/13 2337

## 2013-02-14 NOTE — ED Notes (Addendum)
Patient reports that she has been using her Albuterol Inhaler more than prescribed for the pst 2 weeks. Patient has a productive cough with green sputum. Patient also c/o sore throat. Patient has expiratory wheezing. A week ago patient was seen at [facility] and was given prednisone x 5 days.

## 2013-02-16 LAB — CULTURE, GROUP A STREP

## 2013-02-17 NOTE — ED Provider Notes (Signed)
Medical screening examination/treatment/procedure(s) were performed by non-physician practitioner and as supervising physician I was immediately available for consultation/collaboration.   Darrell Hauk, MD 02/17/13 1646 

## 2013-02-25 ENCOUNTER — Emergency Department (HOSPITAL_COMMUNITY)
Admission: EM | Admit: 2013-02-25 | Discharge: 2013-02-25 | Disposition: A | Discharge status: Home / Self Care | Payer: Medicaid Other | Attending: Emergency Medicine | Admitting: Emergency Medicine

## 2013-02-25 ENCOUNTER — Encounter (HOSPITAL_COMMUNITY): Payer: Self-pay

## 2013-02-25 DIAGNOSIS — R21 Rash and other nonspecific skin eruption: Secondary | ICD-10-CM

## 2013-02-25 DIAGNOSIS — J45909 Unspecified asthma, uncomplicated: Secondary | ICD-10-CM | POA: Insufficient documentation

## 2013-02-25 DIAGNOSIS — Z8669 Personal history of other diseases of the nervous system and sense organs: Secondary | ICD-10-CM | POA: Insufficient documentation

## 2013-02-25 DIAGNOSIS — E669 Obesity, unspecified: Secondary | ICD-10-CM | POA: Insufficient documentation

## 2013-02-25 DIAGNOSIS — L299 Pruritus, unspecified: Secondary | ICD-10-CM | POA: Insufficient documentation

## 2013-02-25 MED ORDER — HYDROXYZINE HCL 25 MG PO TABS
25.0000 mg | ORAL_TABLET | Freq: Once | ORAL | Status: AC
  Administered 2013-02-25: 25 mg via ORAL
  Filled 2013-02-25: qty 1

## 2013-02-25 MED ORDER — PREDNISONE 20 MG PO TABS: 40.0000 mg | ORAL_TABLET | Freq: Every day | ORAL | Status: DC

## 2013-02-25 MED ORDER — HYDROXYZINE HCL 50 MG PO TABS: 50.0000 mg | ORAL_TABLET | Freq: Three times a day (TID) | ORAL | Status: DC | PRN

## 2013-02-25 MED ORDER — PERMETHRIN 5 % EX CREA: TOPICAL_CREAM | Freq: Once | CUTANEOUS | Status: DC

## 2013-02-25 NOTE — ED Provider Notes (Signed)
CSN: [id number]     Arrival date & time 02/25/13  1552 History  This chart was scribed for non-physician practitioner working with [name], MD, by Ardelia Mems ED Scribe. This patient was seen in room WTR8/WTR8 and the patient's care was started at 6:03 PM.    Chief Complaint  Patient presents with  . Rash    The history is provided by the patient. No language interpreter was used.    HPI Comments: Courtney Zamora is a 27 y.o. female who presents to the Emergency Department complaining of a gradual onset, gradually worsening generalized rash that has been present for 2 weeks. She reports associated severe itching. She states that the rash is present "all over" but denies presence of the rash genitally. The rash is also present on her bilateral palms and in between her digits. She states that she has taken Benadryl without relief. She states that she takes no daily medications and has no chronic medical conditions other than asthma. She states that she has stayed in a hotel recently, prior to the onset of the rash. She denies any history of smoking and denies alcohol use. She denies fever, chills, nausea, vomiting or any other symptoms.   Past Medical History  Diagnosis Date  . Asthma   . Obesity   . Seizures    Past Surgical History  Procedure Laterality Date  . Tonsillectomy     Family History  Problem Relation Age of Onset  . Hypothyroidism Mother   . Diabetes Father   . Diabetes Paternal Uncle   . Hypertension Maternal Grandmother   . Hypertension Maternal Grandfather   . Hypertension Paternal Grandmother   . Diabetes Paternal Grandfather   . Heart disease Paternal Grandfather   . Hypertension Paternal Grandfather    History  Substance Use Topics  . Smoking status: Never Smoker   . Smokeless tobacco: Never Used  . Alcohol Use: No   OB History   Grav Para Term Preterm Abortions TAB SAB Ect Mult Living   1    1  1    0     Review of Systems A complete  10 system review of systems was obtained and all systems are negative except as noted in the HPI and PMH.   Allergies  Cinnamon  Home Medications   Current Outpatient Rx  Name  Route  Sig  Dispense  Refill  . albuterol (PROVENTIL HFA;VENTOLIN HFA) 108 (90 BASE) MCG/ACT inhaler   Inhalation   Inhale 2 puffs into the lungs every 6 (six) hours as needed. For shortness of breath.         . diphenhydrAMINE (BENADRYL) 25 MG tablet   Oral   Take 25 mg by mouth every 6 (six) hours as needed for itching.          Triage Vitals: BP 156/77  Pulse 77  Temp(Src) 98.6 F (37 C) (Oral)  Resp 20  SpO2 100%  Physical Exam  Nursing note and vitals reviewed. Constitutional: She is oriented to person, place, and time. She appears well-developed and well-nourished. No distress.  HENT:  Head: Normocephalic and atraumatic.  Mouth/Throat: Oropharynx is clear and moist.  Eyes: EOM are normal. Pupils are equal, round, and reactive to light.  Neck: Normal range of motion. Neck supple. No tracheal deviation present.  Cardiovascular: Normal rate and regular rhythm.   Pulmonary/Chest: Effort normal. No respiratory distress.  Abdominal: Soft. She exhibits no distension.  Musculoskeletal: Normal range of motion. She exhibits no  tenderness.  Neurological: She is alert and oriented to person, place, and time.  Skin: Skin is warm and dry. Rash noted.  The rash is present interdigitally, as a few small, punctate papules with erythema and excoriation.  Psychiatric: She has a normal mood and affect.    ED Course   Medications  hydrOXYzine (ATARAX/VISTARIL) tablet 25 mg (25 mg Oral Given 02/25/13 1900)   Procedures (including critical care time)  DIAGNOSTIC STUDIES: Oxygen Saturation is 100% on RA, normal by my interpretation.    COORDINATION OF CARE: 6:50 PM- Pt advised of clinical suspicion for scabies and plan for treatment and pt agrees.   Labs Reviewed - No data to display  No results  found.  1. Rash     MDM  Patient with fine, diffuse rash with pulmonary and interdigital involvement.  I suspect possible scabies infection.  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  Patient states the os she also stated a hotel to 3 days before her rash began.  He she'll be discharged with home care instructions, Elimite lotion, prednisone and Atarax.  Follow up with PCP       I personally performed the services described in this documentation, which was scribed in my presence. The recorded information has been reviewed and is accurate.      Arthor Captain, PA-C 02/26/13 2157

## 2013-02-25 NOTE — ED Notes (Signed)
Pt c/o rash all over x2wks, states had changed laundry soap but changed back, red bumps noted to wrist and feet

## 2013-02-28 NOTE — ED Provider Notes (Signed)
Medical screening examination/treatment/procedure(s) were performed by non-physician practitioner and as supervising physician I was immediately available for consultation/collaboration.   Juaquin Ludington E Harper Smoker, MD 02/28/13 2255 

## 2013-03-30 ENCOUNTER — Emergency Department (HOSPITAL_COMMUNITY): Payer: Medicaid Other

## 2013-03-30 ENCOUNTER — Encounter (HOSPITAL_COMMUNITY): Payer: Self-pay

## 2013-03-30 ENCOUNTER — Emergency Department (HOSPITAL_COMMUNITY)
Admission: EM | Admit: 2013-03-30 | Discharge: 2013-03-30 | Disposition: A | Discharge status: Home / Self Care | Payer: Medicaid Other | Attending: Emergency Medicine | Admitting: Emergency Medicine

## 2013-03-30 DIAGNOSIS — Y939 Activity, unspecified: Secondary | ICD-10-CM | POA: Insufficient documentation

## 2013-03-30 DIAGNOSIS — Z79899 Other long term (current) drug therapy: Secondary | ICD-10-CM | POA: Insufficient documentation

## 2013-03-30 DIAGNOSIS — S63509A Unspecified sprain of unspecified wrist, initial encounter: Secondary | ICD-10-CM | POA: Insufficient documentation

## 2013-03-30 DIAGNOSIS — Z8669 Personal history of other diseases of the nervous system and sense organs: Secondary | ICD-10-CM | POA: Insufficient documentation

## 2013-03-30 DIAGNOSIS — Y929 Unspecified place or not applicable: Secondary | ICD-10-CM | POA: Insufficient documentation

## 2013-03-30 DIAGNOSIS — S63501A Unspecified sprain of right wrist, initial encounter: Secondary | ICD-10-CM

## 2013-03-30 DIAGNOSIS — J45909 Unspecified asthma, uncomplicated: Secondary | ICD-10-CM | POA: Insufficient documentation

## 2013-03-30 DIAGNOSIS — W010XXA Fall on same level from slipping, tripping and stumbling without subsequent striking against object, initial encounter: Secondary | ICD-10-CM | POA: Insufficient documentation

## 2013-03-30 DIAGNOSIS — E669 Obesity, unspecified: Secondary | ICD-10-CM | POA: Insufficient documentation

## 2013-03-30 MED ORDER — HYDROCODONE-ACETAMINOPHEN 5-325 MG PO TABS
2.0000 | ORAL_TABLET | Freq: Once | ORAL | Status: AC
  Administered 2013-03-30: 2 via ORAL
  Filled 2013-03-30: qty 2

## 2013-03-30 MED ORDER — HYDROCODONE-ACETAMINOPHEN 5-325 MG PO TABS: 1.0000 | ORAL_TABLET | ORAL | Status: DC | PRN

## 2013-03-30 NOTE — ED Notes (Signed)
Per pt, fell 2 weeks ago injuring rt wrist.  Pt says pain went away after a few days.  Yesterday doing house chores and wrist started hurting again.

## 2013-03-30 NOTE — ED Provider Notes (Signed)
CSN: [id number]     Arrival date & time 03/30/13  2016 History  This chart was scribed for non-physician practitioner, Kyung Bacca, PA-C ,working with [name], MD, by Karle Plumber, ED Scribe.  This patient was seen in room WTR9/WTR9 and the patient's care was started at 9:03 PM.    Chief Complaint  Patient presents with  . Wrist Injury   HPI HPI Comments:  Courtney Zamora is a 27 y.o. female who presents to the Emergency Department complaining of constant, severe throbbing wrist pain onset one day. Pt reports tripping and falling approximately two weeks ago and injuring the right wrist. Pain improved spontaneously after 3-4 days but returned today when she tripped over dog and caught herself w/ outstretched right arm and flexed wrist on picnic table. She states the pain worsens with movement. Pt reports using ice compresses earlier today but denies taking any pain medications. No associated paresthesias.  Pt states she has h/o asthma, but no other pertinent medical history.  Past Medical History  Diagnosis Date  . Asthma   . Obesity   . Seizures    Past Surgical History  Procedure Laterality Date  . Tonsillectomy     Family History  Problem Relation Age of Onset  . Hypothyroidism Mother   . Diabetes Father   . Diabetes Paternal Uncle   . Hypertension Maternal Grandmother   . Hypertension Maternal Grandfather   . Hypertension Paternal Grandmother   . Diabetes Paternal Grandfather   . Heart disease Paternal Grandfather   . Hypertension Paternal Grandfather    History  Substance Use Topics  . Smoking status: Never Smoker   . Smokeless tobacco: Never Used  . Alcohol Use: No   OB History   Grav Para Term Preterm Abortions TAB SAB Ect Mult Living   1    1  1    0     Review of Systems  All other systems reviewed and are negative.    Allergies  Cinnamon  Home Medications   Current Outpatient Rx  Name  Route  Sig  Dispense  Refill  . albuterol  (PROVENTIL HFA;VENTOLIN HFA) 108 (90 BASE) MCG/ACT inhaler   Inhalation   Inhale 2 puffs into the lungs every 6 (six) hours as needed. For shortness of breath.         . diphenhydrAMINE (BENADRYL) 25 MG tablet   Oral   Take 25 mg by mouth every 6 (six) hours as needed for itching.          Triage Vitals: BP 138/94  Pulse 84  Temp(Src) 98.2 F (36.8 C) (Oral)  Resp 18  SpO2 98%  LMP 11/08/2012 Physical Exam  Nursing note and vitals reviewed. Constitutional: She is oriented to person, place, and time. She appears well-developed and well-nourished. No distress.  HENT:  Head: Normocephalic and atraumatic.  Eyes:  Normal appearance  Neck: Normal range of motion.  Pulmonary/Chest: Effort normal.  Musculoskeletal: Normal range of motion.  Right wrist w/out deformity, edema, ecchymosis and skin changes.  Tenderness base of ulna, lateral carpal bones  and 4th and 5th metacarpals.  Pain w/ passive ROM pinky and wrist.  Nml elbow.  2+ radial pulse and distal sensation intact.      Neurological: She is alert and oriented to person, place, and time.  Psychiatric: She has a normal mood and affect. Her behavior is normal.    ED Course  Procedures (including critical care time) DIAGNOSTIC STUDIES: Oxygen Saturation is 98% on  RA, normal by my interpretation.   COORDINATION OF CARE: 9:08 PM- Will obtain an X-Ray and give medication for pain. Pt verbalizes understanding and agrees to plan.  Medications  HYDROcodone-acetaminophen (NORCO/VICODIN) 5-325 MG per tablet 2 tablet (not administered)    Labs Review Labs Reviewed - No data to display Imaging Review Dg Wrist Complete Right  03/30/2013   CLINICAL DATA:  Fall.  Ulnar side pain.  EXAM: RIGHT WRIST - COMPLETE 3+ VIEW  COMPARISON:  None.  FINDINGS: There is no evidence of fracture or dislocation. There is no evidence of arthropathy or other focal bone abnormality. Soft tissues are unremarkable.  IMPRESSION: Negative.    Electronically Signed   By: Charlett Nose M.D.   On: 03/30/2013 21:34    MDM   1. Sprain of right wrist, initial encounter    Healthy 27yo F presents w/ right wrist injury.  No NV deficits on exam and xray negative for fx/dislocation.  Results discussed w/ pt.  Ortho tech placed in velcrow splint and pt d/c'd home w/ vicodin.  Recommended NSAID and RICE.  Referred to ortho for persistent/worsening sx.  10:12 PM   I personally performed the services described in this documentation, which was scribed in my presence. The recorded information has been reviewed and is accurate.    Otilio Miu, PA-C 03/30/13 2212

## 2013-04-01 NOTE — ED Provider Notes (Signed)
Medical screening examination/treatment/procedure(s) were performed by non-physician practitioner and as supervising physician I was immediately available for consultation/collaboration.   Louine Tenpenny E Darletta Noblett, MD 04/01/13 0756 

## 2013-04-27 ENCOUNTER — Emergency Department (HOSPITAL_COMMUNITY): Payer: Medicaid Other

## 2013-04-27 ENCOUNTER — Encounter (HOSPITAL_COMMUNITY): Payer: Self-pay | Admitting: Emergency Medicine

## 2013-04-27 ENCOUNTER — Emergency Department (HOSPITAL_COMMUNITY)
Admission: EM | Admit: 2013-04-27 | Discharge: 2013-04-27 | Disposition: A | Discharge status: Home / Self Care | Payer: Medicaid Other | Attending: Emergency Medicine | Admitting: Emergency Medicine

## 2013-04-27 DIAGNOSIS — Y990 Civilian activity done for income or pay: Secondary | ICD-10-CM | POA: Insufficient documentation

## 2013-04-27 DIAGNOSIS — S93409A Sprain of unspecified ligament of unspecified ankle, initial encounter: Secondary | ICD-10-CM | POA: Insufficient documentation

## 2013-04-27 DIAGNOSIS — Y9289 Other specified places as the place of occurrence of the external cause: Secondary | ICD-10-CM | POA: Insufficient documentation

## 2013-04-27 DIAGNOSIS — X500XXA Overexertion from strenuous movement or load, initial encounter: Secondary | ICD-10-CM | POA: Insufficient documentation

## 2013-04-27 DIAGNOSIS — J45909 Unspecified asthma, uncomplicated: Secondary | ICD-10-CM | POA: Insufficient documentation

## 2013-04-27 DIAGNOSIS — E669 Obesity, unspecified: Secondary | ICD-10-CM | POA: Insufficient documentation

## 2013-04-27 DIAGNOSIS — Y9302 Activity, running: Secondary | ICD-10-CM | POA: Insufficient documentation

## 2013-04-27 MED ORDER — IBUPROFEN 600 MG PO TABS: 600.0000 mg | ORAL_TABLET | Freq: Three times a day (TID) | ORAL | Status: DC | PRN

## 2013-04-27 MED ORDER — HYDROCODONE-ACETAMINOPHEN 5-325 MG PO TABS: 1.0000 | ORAL_TABLET | ORAL | Status: DC | PRN

## 2013-04-27 MED ORDER — IBUPROFEN 200 MG PO TABS
600.0000 mg | ORAL_TABLET | Freq: Once | ORAL | Status: DC
  Filled 2013-04-27: qty 3
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
## 2013-04-27 NOTE — ED Provider Notes (Signed)
CSN: [id number]     Arrival date & time 04/27/13  2212 History  This chart was scribed for non-physician practitioner, Earley Favor, FNP,working with Vida Roller, MD, by Karle Plumber, ED Scribe.  This patient was seen in room WTR6/WTR6 and the patient's care was started at 10:39 PM.  Chief Complaint  Patient presents with  . Ankle Injury   The history is provided by the patient. No language interpreter was used.   HPI Comments:  Courtney Zamora is a morbidly obese , 27 y.o. female who presents to the Emergency Department complaining of right ankle pain. Pt states she stepped off a curb and rolled her ankle and heard a pop. She reports elevating the ankle and trying to stay off of it, she also reports taking Tylenol, last night, with no relief. There is no obvious contusion and minimal swelling to the lateral malleolus. She denies any other injury or symptoms.   Past Medical History  Diagnosis Date  . Asthma   . Obesity   . Seizures    Past Surgical History  Procedure Laterality Date  . Tonsillectomy     Family History  Problem Relation Age of Onset  . Hypothyroidism Mother   . Diabetes Father   . Diabetes Paternal Uncle   . Hypertension Maternal Grandmother   . Hypertension Maternal Grandfather   . Hypertension Paternal Grandmother   . Diabetes Paternal Grandfather   . Heart disease Paternal Grandfather   . Hypertension Paternal Grandfather    History  Substance Use Topics  . Smoking status: Never Smoker   . Smokeless tobacco: Never Used  . Alcohol Use: No   OB History   Grav Para Term Preterm Abortions TAB SAB Ect Mult Living   1    1  1    0     Review of Systems  Musculoskeletal: Positive for joint swelling.  Skin: Negative for color change and wound.    Allergies  Cinnamon  Home Medications   Current Outpatient Rx  Name  Route  Sig  Dispense  Refill  . acetaminophen (TYLENOL) 500 MG tablet   Oral   Take 1,000 mg by mouth every 6 (six) hours  as needed for pain.         Marland Kitchen albuterol (PROVENTIL HFA;VENTOLIN HFA) 108 (90 BASE) MCG/ACT inhaler   Inhalation   Inhale 2 puffs into the lungs every 6 (six) hours as needed for shortness of breath. For shortness of breath.         Marland Kitchen HYDROcodone-acetaminophen (NORCO/VICODIN) 5-325 MG per tablet   Oral   Take 1 tablet by mouth every 4 (four) hours as needed for pain.   6 tablet   0   . ibuprofen (ADVIL,MOTRIN) 600 MG tablet   Oral   Take 1 tablet (600 mg total) by mouth every 8 (eight) hours as needed for pain.   30 tablet   0    Triage Vitals: BP 134/71  Pulse 77  Temp(Src) 98 F (36.7 C) (Oral)  Resp 20  SpO2 100%  LMP 11/08/2012 Physical Exam  Constitutional: She appears well-developed and well-nourished.  Patient has not been to examine foot do to obesity  HENT:  Head: Normocephalic.  Eyes: Pupils are equal, round, and reactive to light.  Cardiovascular: Normal rate and regular rhythm.   Pulmonary/Chest: Effort normal.  Musculoskeletal: She exhibits tenderness. She exhibits no edema.       Right ankle: She exhibits decreased range of motion and swelling. She exhibits  no ecchymosis, no deformity and normal pulse. Tenderness. Lateral malleolus tenderness found.       Feet:  Neurological: She is alert.  Skin: Skin is warm and dry.    ED Course  Procedures (including critical care time) DIAGNOSTIC STUDIES: Oxygen Saturation is 100% on RA, normal by my interpretation.   COORDINATION OF CARE: 10:45 PM- Will obtain X-Ray and give pain medication. Pt verbalizes understanding and agrees to plan.  Medications  ibuprofen (ADVIL,MOTRIN) tablet 600 mg (not administered)    Labs Review Labs Reviewed - No data to display Imaging Review Dg Ankle Complete Right  04/27/2013   *RADIOLOGY REPORT*  Clinical Data: Ankle injury  RIGHT ANKLE - COMPLETE 3+ VIEW  Comparison: None available  Findings: Mild soft tissue swelling is present at the lateral malleolus.  There is no  acute fracture or dislocation.  The ankle mortise is approximated.  The talar dome is intact.  No joint effusion is identified.  Osseous mineralization is within normal limits.  No radiopaque foreign body.  IMPRESSION: Mild soft tissue swelling at the lateral malleolus.  No acute fracture or dislocation.   Original Report Authenticated By: Rise Mu, M.D.    EKG Interpretation   None       MDM   1. Ankle sprain and strain, right, initial encounter      I personally performed the services described in this documentation, which was scribed in my presence. The recorded information has been reviewed and is accurate.   Arman Filter, NP 04/27/13 [phone]

## 2013-04-28 NOTE — ED Provider Notes (Signed)
Medical screening examination/treatment/procedure(s) were performed by non-physician practitioner and as supervising physician I was immediately available for consultation/collaboration.    Vida Roller, MD 04/28/13 [phone]

## 2013-05-07 ENCOUNTER — Emergency Department (HOSPITAL_COMMUNITY)
Admission: EM | Admit: 2013-05-07 | Discharge: 2013-05-07 | Disposition: A | Discharge status: Home / Self Care | Payer: Worker's Compensation | Attending: Emergency Medicine | Admitting: Emergency Medicine

## 2013-05-07 ENCOUNTER — Emergency Department (HOSPITAL_COMMUNITY): Payer: Worker's Compensation

## 2013-05-07 DIAGNOSIS — Y9229 Other specified public building as the place of occurrence of the external cause: Secondary | ICD-10-CM | POA: Insufficient documentation

## 2013-05-07 DIAGNOSIS — Z79899 Other long term (current) drug therapy: Secondary | ICD-10-CM | POA: Insufficient documentation

## 2013-05-07 DIAGNOSIS — S60222A Contusion of left hand, initial encounter: Secondary | ICD-10-CM

## 2013-05-07 DIAGNOSIS — S8002XA Contusion of left knee, initial encounter: Secondary | ICD-10-CM

## 2013-05-07 DIAGNOSIS — S8000XA Contusion of unspecified knee, initial encounter: Secondary | ICD-10-CM | POA: Insufficient documentation

## 2013-05-07 DIAGNOSIS — IMO0002 Reserved for concepts with insufficient information to code with codable children: Secondary | ICD-10-CM | POA: Insufficient documentation

## 2013-05-07 DIAGNOSIS — S80211A Abrasion, right knee, initial encounter: Secondary | ICD-10-CM

## 2013-05-07 DIAGNOSIS — J45909 Unspecified asthma, uncomplicated: Secondary | ICD-10-CM | POA: Insufficient documentation

## 2013-05-07 DIAGNOSIS — S46912A Strain of unspecified muscle, fascia and tendon at shoulder and upper arm level, left arm, initial encounter: Secondary | ICD-10-CM

## 2013-05-07 DIAGNOSIS — S60229A Contusion of unspecified hand, initial encounter: Secondary | ICD-10-CM | POA: Insufficient documentation

## 2013-05-07 DIAGNOSIS — G40909 Epilepsy, unspecified, not intractable, without status epilepticus: Secondary | ICD-10-CM | POA: Insufficient documentation

## 2013-05-07 DIAGNOSIS — W108XXA Fall (on) (from) other stairs and steps, initial encounter: Secondary | ICD-10-CM | POA: Insufficient documentation

## 2013-05-07 DIAGNOSIS — Y990 Civilian activity done for income or pay: Secondary | ICD-10-CM | POA: Insufficient documentation

## 2013-05-07 MED ORDER — OXYCODONE-ACETAMINOPHEN 5-325 MG PO TABS: 1.0000 | ORAL_TABLET | Freq: Four times a day (QID) | ORAL | Status: DC | PRN

## 2013-05-07 MED ORDER — IBUPROFEN 800 MG PO TABS: 800.0000 mg | ORAL_TABLET | Freq: Three times a day (TID) | ORAL | Status: DC | PRN

## 2013-05-07 MED ORDER — ONDANSETRON 8 MG PO TBDP
8.0000 mg | ORAL_TABLET | Freq: Once | ORAL | Status: AC
  Administered 2013-05-07: 8 mg via ORAL
  Filled 2013-05-07: qty 1

## 2013-05-07 MED ORDER — HYDROCODONE-ACETAMINOPHEN 5-325 MG PO TABS
1.0000 | ORAL_TABLET | Freq: Once | ORAL | Status: AC
  Administered 2013-05-07: 1 via ORAL
  Filled 2013-05-07: qty 1

## 2013-05-07 NOTE — ED Notes (Signed)
Pt states that she was just at work when she missed a step and landed forwards on her hands and knees. No LOC. Feels nauseated and like she may pass out.

## 2013-05-07 NOTE — ED Provider Notes (Signed)
CSN: [id number]     Arrival date & time 05/07/13  1842 History  This chart was scribed for non-physician practitioner, Ebbie Ridge, PA-C working with Shanna Cisco, MD by Greggory Stallion, ED scribe. This patient was seen in room WTR5/WTR5 and the patient's care was started at 6:47 PM.   Chief Complaint  Patient presents with  . Fall   The history is provided by the patient. No language interpreter was used.   HPI Comments: Courtney Zamora is a 27 y.o. female who presents to the Emergency Department complaining of a fall that occurred about an hour ago. Pt states she missed a step at work and landed on her hands and knees. She denies hitting her head or LOC. She has sudden onset bilateral knee pain, abrasions to right knee and left shoulder pain. Pt states the left knee radiates pain into her shin. She states her tetanus is up to date.   Past Medical History  Diagnosis Date  . Asthma   . Obesity   . Seizures    Past Surgical History  Procedure Laterality Date  . Tonsillectomy     Family History  Problem Relation Age of Onset  . Hypothyroidism Mother   . Diabetes Father   . Diabetes Paternal Uncle   . Hypertension Maternal Grandmother   . Hypertension Maternal Grandfather   . Hypertension Paternal Grandmother   . Diabetes Paternal Grandfather   . Heart disease Paternal Grandfather   . Hypertension Paternal Grandfather    History  Substance Use Topics  . Smoking status: Never Smoker   . Smokeless tobacco: Never Used  . Alcohol Use: No   OB History   Grav Para Term Preterm Abortions TAB SAB Ect Mult Living   1    1  1    0     Review of Systems A complete 10 system review of systems was obtained and all systems are negative except as noted in the HPI and PMH.   Allergies  Cinnamon  Home Medications   Current Outpatient Rx  Name  Route  Sig  Dispense  Refill  . acetaminophen (TYLENOL) 500 MG tablet   Oral   Take 1,000 mg by mouth every 6 (six) hours as  needed for pain.         Marland Kitchen albuterol (PROVENTIL HFA;VENTOLIN HFA) 108 (90 BASE) MCG/ACT inhaler   Inhalation   Inhale 2 puffs into the lungs every 6 (six) hours as needed for shortness of breath. For shortness of breath.         Marland Kitchen HYDROcodone-acetaminophen (NORCO/VICODIN) 5-325 MG per tablet   Oral   Take 1 tablet by mouth every 4 (four) hours as needed for pain.   6 tablet   0   . ibuprofen (ADVIL,MOTRIN) 600 MG tablet   Oral   Take 1 tablet (600 mg total) by mouth every 8 (eight) hours as needed for pain.   30 tablet   0    BP 159/84  Pulse 103  Temp(Src) 97.8 F (36.6 C) (Oral)  Resp 20  SpO2 100%  Physical Exam  Nursing note and vitals reviewed. Constitutional: She is oriented to person, place, and time. She appears well-developed and well-nourished. No distress.  HENT:  Head: Normocephalic and atraumatic.  Eyes: EOM are normal.  Neck: Neck supple. No tracheal deviation present.  Cardiovascular: Normal rate.   Pulmonary/Chest: Effort normal. No respiratory distress.  Musculoskeletal: Normal range of motion.  Tenderness to left anterior knee. Diffuse pain in  left hand. Tenderness to left shoulder.   Neurological: She is alert and oriented to person, place, and time.  Skin: Skin is warm and dry.  Abrasions to right and left knees.   Psychiatric: She has a normal mood and affect. Her behavior is normal.    ED Course  Procedures (including critical care time)  DIAGNOSTIC STUDIES: Oxygen Saturation is 100% on RA, normal by my interpretation.    COORDINATION OF CARE: 6:50 PM-Discussed treatment plan which includes xrays with pt at bedside and pt agreed to plan.   Labs Review Labs Reviewed - No data to display Imaging Review Dg Shoulder Left  05/07/2013   CLINICAL DATA:  Fall and left shoulder pain.  EXAM: LEFT SHOULDER - 2+ VIEW  COMPARISON:  Chest radiograph 02/14/2013  FINDINGS: Left shoulder is located without acute fracture. Visualized left ribs are  intact. Left AC joint appears to be intact.  IMPRESSION: No acute bone abnormality to left shoulder.   Electronically Signed   By: Richarda Overlie M.D.   On: 05/07/2013 19:27   Dg Knee Complete 4 Views Left  05/07/2013   CLINICAL DATA:  Fall and left knee pain.  EXAM: LEFT KNEE - COMPLETE 4+ VIEW  COMPARISON:  09/28/2009  FINDINGS: Negative for a fracture or dislocation. Mild degenerative changes in the patellofemoral compartment. No significant joint effusion. Normal alignment of the left knee.  IMPRESSION: No acute bone abnormality to the left knee.   Electronically Signed   By: Richarda Overlie M.D.   On: 05/07/2013 19:30   Dg Hand Complete Left  05/07/2013   CLINICAL DATA:  Fall and left hand pain.  EXAM: LEFT HAND - COMPLETE 3+ VIEW  COMPARISON:  None.  FINDINGS: There is no evidence of fracture or dislocation. There is no evidence of arthropathy or other focal bone abnormality. Soft tissues are unremarkable.  IMPRESSION: Negative.   Electronically Signed   By: Richarda Overlie M.D.   On: 05/07/2013 19:29    I personally performed the services described in this documentation, which was scribed in my presence. The recorded information has been reviewed and is accurate.  Patient does not have any abnormalities noted on her x-rays.  Patient is advised return here as, needed.  We'll get the patient.  Treatment for her symptoms.  Told to use ice, on areas that are sore  Carlyle Dolly, PA-C 05/07/13 1942

## 2013-05-08 NOTE — ED Provider Notes (Signed)
Medical screening examination/treatment/procedure(s) were performed by non-physician practitioner and as supervising physician I was immediately available for consultation/collaboration.  Megan E Docherty, MD 05/08/13 0044 

## 2013-05-12 ENCOUNTER — Emergency Department (HOSPITAL_COMMUNITY)
Admission: EM | Admit: 2013-05-12 | Discharge: 2013-05-12 | Disposition: A | Discharge status: Home / Self Care | Payer: Worker's Compensation | Attending: Emergency Medicine | Admitting: Emergency Medicine

## 2013-05-12 ENCOUNTER — Encounter (HOSPITAL_COMMUNITY): Payer: Self-pay | Admitting: Emergency Medicine

## 2013-05-12 DIAGNOSIS — J45909 Unspecified asthma, uncomplicated: Secondary | ICD-10-CM | POA: Insufficient documentation

## 2013-05-12 DIAGNOSIS — E669 Obesity, unspecified: Secondary | ICD-10-CM | POA: Insufficient documentation

## 2013-05-12 DIAGNOSIS — Z79899 Other long term (current) drug therapy: Secondary | ICD-10-CM | POA: Insufficient documentation

## 2013-05-12 DIAGNOSIS — M79672 Pain in left foot: Secondary | ICD-10-CM

## 2013-05-12 DIAGNOSIS — Z8669 Personal history of other diseases of the nervous system and sense organs: Secondary | ICD-10-CM | POA: Insufficient documentation

## 2013-05-12 DIAGNOSIS — M79609 Pain in unspecified limb: Secondary | ICD-10-CM | POA: Insufficient documentation

## 2013-05-12 NOTE — ED Provider Notes (Signed)
Medical screening examination/treatment/procedure(s) were performed by non-physician practitioner and as supervising physician I was immediately available for consultation/collaboration.  EKG Interpretation   None        Toy Baker, MD 05/12/13 2316

## 2013-05-12 NOTE — ED Provider Notes (Signed)
CSN: [id number]     Arrival date & time 05/12/13  1926 History   None   Chief Complaint  Patient presents with  . Toe Injury   The history is provided by the patient. No language interpreter was used.   HPI Comments: Patient is a 27 year old female with history of recent knee and left ankle injury who presents with concerns for her cold and blue left toes and foot. Patient states that she had removed her ASO ankle brace earlier today and felt that her left toes were slight bluish color and cooler than normal. She was concerned for blood clot in the leg. She has had slightly increased pain to the foot as well. She is been using a knee immobilizer and crutches as well as ice and elevation at home. She is followed by orthopedic specialist. She denies any worsening swelling of the ankle or leg. Denies any new pains in the calf. No chest pain or shortness of breath. No cough or hemoptysis. No prior history of DVT or PE. She does not use estrogen or birth control.   Past Medical History  Diagnosis Date  . Asthma   . Obesity   . Seizures    Past Surgical History  Procedure Laterality Date  . Tonsillectomy     Family History  Problem Relation Age of Onset  . Hypothyroidism Mother   . Diabetes Father   . Diabetes Paternal Uncle   . Hypertension Maternal Grandmother   . Hypertension Maternal Grandfather   . Hypertension Paternal Grandmother   . Diabetes Paternal Grandfather   . Heart disease Paternal Grandfather   . Hypertension Paternal Grandfather    History  Substance Use Topics  . Smoking status: Never Smoker   . Smokeless tobacco: Never Used  . Alcohol Use: No   OB History   Grav Para Term Preterm Abortions TAB SAB Ect Mult Living   1    1  1    0     Review of Systems  Constitutional: Negative for fever.  Respiratory: Negative for shortness of breath.   Cardiovascular: Negative for chest pain.  All other systems reviewed and are negative.    Allergies   Cinnamon  Home Medications   Current Outpatient Rx  Name  Route  Sig  Dispense  Refill  . acetaminophen (TYLENOL) 500 MG tablet   Oral   Take 1,000 mg by mouth every 6 (six) hours as needed for pain.         Marland Kitchen albuterol (PROVENTIL HFA;VENTOLIN HFA) 108 (90 BASE) MCG/ACT inhaler   Inhalation   Inhale 2 puffs into the lungs every 6 (six) hours as needed for shortness of breath. For shortness of breath.         Marland Kitchen HYDROcodone-acetaminophen (NORCO/VICODIN) 5-325 MG per tablet   Oral   Take 1 tablet by mouth every 4 (four) hours as needed for pain.   6 tablet   0   . ibuprofen (ADVIL,MOTRIN) 800 MG tablet   Oral   Take 1 tablet (800 mg total) by mouth every 8 (eight) hours as needed for pain.   21 tablet   0   . oxyCODONE-acetaminophen (PERCOCET/ROXICET) 5-325 MG per tablet   Oral   Take 1 tablet by mouth every 6 (six) hours as needed for pain.   15 tablet   0    BP 127/70  Pulse 59  Temp(Src) 98.1 F (36.7 C) (Oral)  SpO2 100%  LMP 10/30/2012  Physical Exam  Nursing note  and vitals reviewed. Constitutional: She is oriented to person, place, and time. She appears well-developed and well-nourished. No distress.  HENT:  Head: Normocephalic and atraumatic.  Eyes: Conjunctivae are normal.  Cardiovascular: Normal rate and regular rhythm.   No murmur heard. Pulmonary/Chest: Effort normal and breath sounds normal. No respiratory distress. She has no wheezes. She has no rales.  Musculoskeletal: She exhibits tenderness.  Mild swelling around the left lateral malleolus area. No gross deformities. There is tenderness to palpation. Mild pains over the dorsal left foot. Normal dorsal pedal pulses and posterior tibialis pulses. Normal capillary refill less than 2 seconds. Skin color normal. No significant swelling of the calf. Very mild tenderness of the calf and posterior knee.  Neurological: She is alert and oriented to person, place, and time.  Skin: Skin is warm and dry. No  rash noted.  Psychiatric: She has a normal mood and affect. Her behavior is normal.    ED Course  Procedures   DIAGNOSTIC STUDIES: Oxygen Saturation is 100% on RA, Normal by my interpretation.    COORDINATION OF CARE: 7:55 PM-patient seen and evaluated. Patient well-appearing no acute distress. Patient has normal pulses in the left foot with normal capillary refill less than 2 seconds. No significant swelling of the foot and ankle area. No asymmetry between bilateral calves. Patient without significant risk factors for DVT. Discussed treatment plan with pt at bedside which includes vascular ultrasound scheduled tomorrow and pt agreed to plan.       MDM   1. Foot pain, left      Angus Seller, PA-C 05/12/13 2030

## 2013-05-12 NOTE — ED Notes (Signed)
Pt states that she fx knee and ankle on L side and has been wearing knee brace and ankle brace. While she was lying w/o brace on she realized that her toes were cold and discolored. Toes are somewhat cooler than R foot.

## 2013-05-13 ENCOUNTER — Ambulatory Visit (HOSPITAL_COMMUNITY)
Admission: RE | Admit: 2013-05-13 | Discharge: 2013-05-13 | Disposition: A | Discharge status: Home / Self Care | Payer: Medicaid Other | Source: Ambulatory Visit | Attending: Emergency Medicine | Admitting: Emergency Medicine

## 2013-05-13 DIAGNOSIS — M25429 Effusion, unspecified elbow: Secondary | ICD-10-CM | POA: Insufficient documentation

## 2013-05-13 DIAGNOSIS — R29898 Other symptoms and signs involving the musculoskeletal system: Secondary | ICD-10-CM | POA: Insufficient documentation

## 2013-05-13 DIAGNOSIS — M79609 Pain in unspecified limb: Secondary | ICD-10-CM

## 2013-05-13 NOTE — Progress Notes (Signed)
*  PRELIMINARY RESULTS* Vascular Ultrasound Left lower extremity venous duplex has been completed.  Preliminary findings: no evidence of DVT or baker's cyst.  Technically limited due to patient body habitus.   Farrel Demark, RDMS, RVT  05/13/2013, 3:13 PM

## 2013-07-29 ENCOUNTER — Emergency Department (HOSPITAL_BASED_OUTPATIENT_CLINIC_OR_DEPARTMENT_OTHER): Payer: Medicaid Other

## 2013-07-29 ENCOUNTER — Emergency Department (HOSPITAL_BASED_OUTPATIENT_CLINIC_OR_DEPARTMENT_OTHER)
Admission: EM | Admit: 2013-07-29 | Discharge: 2013-07-29 | Disposition: A | Discharge status: Home / Self Care | Payer: Medicaid Other | Attending: Emergency Medicine | Admitting: Emergency Medicine

## 2013-07-29 ENCOUNTER — Encounter (HOSPITAL_BASED_OUTPATIENT_CLINIC_OR_DEPARTMENT_OTHER): Payer: Self-pay | Admitting: Emergency Medicine

## 2013-07-29 DIAGNOSIS — Y929 Unspecified place or not applicable: Secondary | ICD-10-CM | POA: Insufficient documentation

## 2013-07-29 DIAGNOSIS — Y939 Activity, unspecified: Secondary | ICD-10-CM | POA: Insufficient documentation

## 2013-07-29 DIAGNOSIS — Z79899 Other long term (current) drug therapy: Secondary | ICD-10-CM | POA: Insufficient documentation

## 2013-07-29 DIAGNOSIS — T7840XA Allergy, unspecified, initial encounter: Secondary | ICD-10-CM

## 2013-07-29 DIAGNOSIS — E669 Obesity, unspecified: Secondary | ICD-10-CM | POA: Insufficient documentation

## 2013-07-29 DIAGNOSIS — T628X1A Toxic effect of other specified noxious substances eaten as food, accidental (unintentional), initial encounter: Secondary | ICD-10-CM | POA: Insufficient documentation

## 2013-07-29 DIAGNOSIS — Z3202 Encounter for pregnancy test, result negative: Secondary | ICD-10-CM | POA: Insufficient documentation

## 2013-07-29 DIAGNOSIS — N390 Urinary tract infection, site not specified: Secondary | ICD-10-CM | POA: Insufficient documentation

## 2013-07-29 DIAGNOSIS — J45901 Unspecified asthma with (acute) exacerbation: Secondary | ICD-10-CM | POA: Insufficient documentation

## 2013-07-29 DIAGNOSIS — Z791 Long term (current) use of non-steroidal anti-inflammatories (NSAID): Secondary | ICD-10-CM | POA: Insufficient documentation

## 2013-07-29 DIAGNOSIS — Z8669 Personal history of other diseases of the nervous system and sense organs: Secondary | ICD-10-CM | POA: Insufficient documentation

## 2013-07-29 LAB — PREGNANCY, URINE: PREG TEST UR: NEGATIVE

## 2013-07-29 LAB — URINALYSIS, ROUTINE W REFLEX MICROSCOPIC
Bilirubin Urine: NEGATIVE
Glucose, UA: NEGATIVE mg/dL
Ketones, ur: NEGATIVE mg/dL
NITRITE: NEGATIVE
Protein, ur: NEGATIVE mg/dL
SPECIFIC GRAVITY, URINE: 1.021 (ref 1.005–1.030)
UROBILINOGEN UA: 0.2 mg/dL (ref 0.0–1.0)
pH: 8 (ref 5.0–8.0)

## 2013-07-29 LAB — URINE MICROSCOPIC-ADD ON

## 2013-07-29 MED ORDER — FAMOTIDINE 20 MG PO TABS
20.0000 mg | ORAL_TABLET | Freq: Once | ORAL | Status: AC
  Administered 2013-07-29: 20 mg via ORAL
  Filled 2013-07-29: qty 1

## 2013-07-29 MED ORDER — DIPHENHYDRAMINE HCL 25 MG PO CAPS
25.0000 mg | ORAL_CAPSULE | Freq: Once | ORAL | Status: AC
  Administered 2013-07-29: 25 mg via ORAL
  Filled 2013-07-29: qty 1

## 2013-07-29 MED ORDER — ALBUTEROL SULFATE HFA 108 (90 BASE) MCG/ACT IN AERS
2.0000 | INHALATION_SPRAY | Freq: Once | RESPIRATORY_TRACT | Status: AC
  Administered 2013-07-29: 2 via RESPIRATORY_TRACT

## 2013-07-29 MED ORDER — ALBUTEROL SULFATE HFA 108 (90 BASE) MCG/ACT IN AERS
INHALATION_SPRAY | RESPIRATORY_TRACT | Status: AC
  Administered 2013-07-29: 01:00:00 2 via RESPIRATORY_TRACT
  Filled 2013-07-29: qty 6.7

## 2013-07-29 MED ORDER — NITROFURANTOIN MONOHYD MACRO 100 MG PO CAPS: 100.0000 mg | ORAL_CAPSULE | Freq: Two times a day (BID) | ORAL | Status: DC

## 2013-07-29 NOTE — ED Notes (Signed)
Pt states she is unable to urinate at this time. EDP notified.

## 2013-07-29 NOTE — ED Notes (Signed)
Pt states she is unable to urinate at this time. EDP notified. Pt given water to drink.

## 2013-07-29 NOTE — ED Notes (Addendum)
Pt reports abdominal pain, N/V, chest tightness, nose bleed, that began after eating [place] food. Pt also reports shortness of breath, cough. Pt states she walked to [facility] from [facility] and called EMS in route.

## 2013-07-29 NOTE — ED Provider Notes (Signed)
CSN: [id number]     Arrival date & time 07/29/13  0059 History   First MD Initiated Contact with Patient 07/29/13 0133     Chief Complaint  Patient presents with  . Abdominal Pain  . Shortness of Breath  . Cough   (Consider location/radiation/quality/duration/timing/severity/associated sxs/prior Treatment) Patient is a 28 y.o. female presenting with cough. The history is provided by the patient.  Cough Cough characteristics:  Non-productive Severity:  Mild Onset quality:  Sudden Timing:  Sporadic Progression:  Unchanged Chronicity:  New Smoker: no   Context comment:  Ate [place] barbecue now states wheezing and coughing.  No swelling no hives Relieved by:  Nothing Worsened by:  Nothing tried Ineffective treatments:  None tried Associated symptoms: wheezing   Associated symptoms: no diaphoresis, no rash and no sinus congestion   Wheezing:    Severity:  Mild   Onset quality:  Sudden   Timing:  Constant   Progression:  Unchanged   Chronicity:  Recurrent Risk factors: no recent travel     Past Medical History  Diagnosis Date  . Asthma   . Obesity   . Seizures    Past Surgical History  Procedure Laterality Date  . Tonsillectomy     Family History  Problem Relation Age of Onset  . Hypothyroidism Mother   . Diabetes Father   . Diabetes Paternal Uncle   . Hypertension Maternal Grandmother   . Hypertension Maternal Grandfather   . Hypertension Paternal Grandmother   . Diabetes Paternal Grandfather   . Heart disease Paternal Grandfather   . Hypertension Paternal Grandfather    History  Substance Use Topics  . Smoking status: Never Smoker   . Smokeless tobacco: Never Used  . Alcohol Use: No   OB History   Grav Para Term Preterm Abortions TAB SAB Ect Mult Living   1    1  1    0     Review of Systems  Constitutional: Negative for diaphoresis.  HENT: Negative for facial swelling.   Respiratory: Positive for cough and wheezing.   Skin: Negative for rash.   All other systems reviewed and are negative.    Allergies  Cinnamon and Bee venom  Home Medications   Current Outpatient Rx  Name  Route  Sig  Dispense  Refill  . acetaminophen (TYLENOL) 500 MG tablet   Oral   Take 1,000 mg by mouth every 6 (six) hours as needed for pain.         albuterol (PROVENTIL HFA;VENTOLIN HFA) 108 (90 BASE) MCG/ACT inhaler   Inhalation   Inhale 2 puffs into the lungs every 6 (six) hours as needed for shortness of breath. For shortness of breath.         . celecoxib (CELEBREX) 200 MG capsule   Oral   Take 200 mg by mouth 2 (two) times daily.         . cyclobenzaprine (FLEXERIL) 10 MG tablet   Oral   Take 10 mg by mouth at bedtime.         . methocarbamol (ROBAXIN) 750 MG tablet   Oral   Take 750 mg by mouth 2 (two) times daily.         . traMADol (ULTRAM-ER) 100 MG 24 hr tablet   Oral   Take 100 mg by mouth 2 (two) times daily.         Marland Kitchen HYDROcodone-acetaminophen (NORCO/VICODIN) 5-325 MG per tablet   Oral   Take 1 tablet by mouth every  4 (four) hours as needed for pain.   6 tablet   0   . ibuprofen (ADVIL,MOTRIN) 800 MG tablet   Oral   Take 1 tablet (800 mg total) by mouth every 8 (eight) hours as needed for pain.   21 tablet   0   . oxyCODONE-acetaminophen (PERCOCET/ROXICET) 5-325 MG per tablet   Oral   Take 1 tablet by mouth every 6 (six) hours as needed for pain.   15 tablet   0    BP 146/79  Pulse 110  Temp(Src) 98.6 F (37 C) (Oral)  Resp 22  Ht 5\' 4"  (1.626 m)  Wt 300 lb (136.079 kg)  BMI 51.47 kg/m2  SpO2 99%  LMP 10/27/2012 Physical Exam  Constitutional: She is oriented to person, place, and time. She appears well-developed and well-nourished. No distress.  HENT:  Head: Normocephalic and atraumatic.  Mouth/Throat: Oropharynx is clear and moist.  No swelling of the lips tongue or uvula  Eyes: Conjunctivae are normal. Pupils are equal, round, and reactive to light.  Neck: Normal range of motion. Neck  supple.  Cardiovascular: Normal rate, regular rhythm and intact distal pulses.   Pulmonary/Chest: Effort normal and breath sounds normal. No respiratory distress. She has no wheezes. She has no rales. She exhibits no tenderness.  Abdominal: Soft. Bowel sounds are normal. There is no tenderness. There is no rebound and no guarding.  Musculoskeletal: Normal range of motion.  Neurological: She is alert and oriented to person, place, and time.  Skin: Skin is warm and dry.  Psychiatric: She has a normal mood and affect.    ED Course  Procedures (including critical care time) Labs Review Labs Reviewed  PREGNANCY, URINE  URINALYSIS, ROUTINE W REFLEX MICROSCOPIC   Imaging Review Dg Chest 2 View  07/29/2013   CLINICAL DATA:  Shortness of breath, abdominal pain, cough.  EXAM: CHEST  2 VIEW  COMPARISON:  02/14/2013  FINDINGS: The heart size and mediastinal contours are within normal limits. Both lungs are clear. The visualized skeletal structures are unremarkable.  IMPRESSION: No active cardiopulmonary disease.   Electronically Signed   By: 02/16/2013 M.D.   On: 07/29/2013 01:30    EKG Interpretation   None       MDM  No diagnosis found. Benadryl as needed and use inhaler.  Antibiotic for UTI.  Follow up with your PMD for ongoing care   Adan Baehr K Eldean Klatt-Rasch, MD 07/29/13 [phone]

## 2013-07-30 LAB — URINE CULTURE: Colony Count: 50000

## 2013-11-12 ENCOUNTER — Encounter (HOSPITAL_COMMUNITY): Payer: Self-pay | Admitting: Emergency Medicine

## 2013-11-12 ENCOUNTER — Emergency Department (HOSPITAL_COMMUNITY)
Admission: EM | Admit: 2013-11-12 | Discharge: 2013-11-13 | Disposition: A | Discharge status: Home / Self Care | Payer: Medicaid Other | Attending: Emergency Medicine | Admitting: Emergency Medicine

## 2013-11-12 DIAGNOSIS — M25562 Pain in left knee: Secondary | ICD-10-CM

## 2013-11-12 DIAGNOSIS — W108XXA Fall (on) (from) other stairs and steps, initial encounter: Secondary | ICD-10-CM | POA: Insufficient documentation

## 2013-11-12 DIAGNOSIS — S99929A Unspecified injury of unspecified foot, initial encounter: Principal | ICD-10-CM

## 2013-11-12 DIAGNOSIS — M79672 Pain in left foot: Secondary | ICD-10-CM

## 2013-11-12 DIAGNOSIS — Y929 Unspecified place or not applicable: Secondary | ICD-10-CM | POA: Insufficient documentation

## 2013-11-12 DIAGNOSIS — R569 Unspecified convulsions: Secondary | ICD-10-CM | POA: Insufficient documentation

## 2013-11-12 DIAGNOSIS — F411 Generalized anxiety disorder: Secondary | ICD-10-CM | POA: Insufficient documentation

## 2013-11-12 DIAGNOSIS — Y939 Activity, unspecified: Secondary | ICD-10-CM | POA: Insufficient documentation

## 2013-11-12 DIAGNOSIS — Z888 Allergy status to other drugs, medicaments and biological substances status: Secondary | ICD-10-CM | POA: Insufficient documentation

## 2013-11-12 DIAGNOSIS — S99919A Unspecified injury of unspecified ankle, initial encounter: Principal | ICD-10-CM

## 2013-11-12 DIAGNOSIS — S8990XA Unspecified injury of unspecified lower leg, initial encounter: Secondary | ICD-10-CM | POA: Insufficient documentation

## 2013-11-12 DIAGNOSIS — W19XXXA Unspecified fall, initial encounter: Secondary | ICD-10-CM

## 2013-11-12 DIAGNOSIS — Z79899 Other long term (current) drug therapy: Secondary | ICD-10-CM | POA: Insufficient documentation

## 2013-11-12 HISTORY — DX: Anxiety disorder, unspecified: F41.9

## 2013-11-12 NOTE — ED Notes (Signed)
Pt. fell from stairs this evening , reports pain at bilateral knee and left 3rd/4th toes , no LOC / ambulatory .

## 2013-11-12 NOTE — ED Notes (Signed)
Pt reports bilateral knee pain, left grater than right post fall down two stairs this evening. States she broke left knee a few months ago and is concerned. Pt also complaining of left foot pain from fall.

## 2013-11-12 NOTE — ED Provider Notes (Signed)
CSN: [id number]     Arrival date & time 11/12/13  2234 History  This chart was scribed for non-physician practitioner Armandina Gemma working with Courtney Gaskins, MD by Joaquin Music, ED Scribe. This patient was seen in room TR05C/TR05C and the patient's care was started at 12:05 AM .   Chief Complaint  Patient presents with  . Knee Pain   The history is provided by the patient. No language interpreter was used.   HPI Comments: Courtney Zamora is a 28 y.o. female who presents to the Emergency Department complaining of bilateral knee pain and L 3rd and 4th toe pain due to a fall that took place today. Pt states she broke her L knee in October 2014. Denies needing surgery at that time. States she was has not been able to get back "to normal". She states her knee buckled while going up wooden stairs outdoors this morning and states she fell on bilateral knees on concrete. She generally wears a hinge brace, but was not wearing it at the time of the fall. Describes the pain as constant and sharp. She c/o L 3rd and 4th toe pain due to her "toe getting caught on a cement brick". Last tetanus was within the last 5 years. She denies LOC, hitting head, weakness, and any other injuries.   Pt states her orthopedic provider informed her she has a cyst to the posterior aspect of her L knee.   Past Medical History  Diagnosis Date  . Asthma   . Obesity   . Seizures    Past Surgical History  Procedure Laterality Date  . Tonsillectomy     Family History  Problem Relation Age of Onset  . Hypothyroidism Mother   . Diabetes Father   . Diabetes Paternal Uncle   . Hypertension Maternal Grandmother   . Hypertension Maternal Grandfather   . Hypertension Paternal Grandmother   . Diabetes Paternal Grandfather   . Heart disease Paternal Grandfather   . Hypertension Paternal Grandfather    History  Substance Use Topics  . Smoking status: Never Smoker   . Smokeless tobacco: Never Used  .  Alcohol Use: No   OB History   Grav Para Term Preterm Abortions TAB SAB Ect Mult Living   1    1  1    0     Review of Systems  Skin: Positive for color change (to knees). Negative for wound.  Neurological: Negative for syncope and weakness.  All other systems reviewed and are negative.  Allergies  Bee venom; Cinnamon; and Ibuprofen  Home Medications   Prior to Admission medications   Medication Sig Start Date End Date Taking? Authorizing Provider  albuterol (PROVENTIL HFA;VENTOLIN HFA) 108 (90 BASE) MCG/ACT inhaler Inhale 2 puffs into the lungs every 6 (six) hours as needed for shortness of breath. For shortness of breath.   Yes Historical Provider, MD  celecoxib (CELEBREX) 200 MG capsule Take 200 mg by mouth daily.    Yes Historical Provider, MD  cyclobenzaprine (FLEXERIL) 10 MG tablet Take 10 mg by mouth at bedtime.   Yes Historical Provider, MD  methocarbamol (ROBAXIN) 750 MG tablet Take 750 mg by mouth 2 (two) times daily.   Yes Historical Provider, MD  traMADol (ULTRAM-ER) 100 MG 24 hr tablet Take 100 mg by mouth daily as needed for pain.    Yes Historical Provider, MD   BP 119/65  Pulse 88  Temp(Src) 98.4 F (36.9 C) (Oral)  Resp 22  Wt 333 lb (  151.048 kg)  SpO2 100%  Physical Exam  Nursing note and vitals reviewed. Constitutional: She is oriented to person, place, and time. She appears well-developed and well-nourished. No distress.  Morbidly obsese.  HENT:  Head: Normocephalic and atraumatic.  Right Ear: External ear normal.  Left Ear: External ear normal.  Nose: Nose normal.  Mouth/Throat: Oropharynx is clear and moist.  Eyes: Conjunctivae are normal.  Neck: Normal range of motion.  Cardiovascular: Normal rate, regular rhythm, normal heart sounds, intact distal pulses and normal pulses.   Pulses:      Radial pulses are 2+ on the right side, and 2+ on the left side.       Dorsalis pedis pulses are 2+ on the right side, and 2+ on the left side.       Posterior  tibial pulses are 2+ on the right side, and 2+ on the left side.  Pulmonary/Chest: Effort normal and breath sounds normal. No stridor. No respiratory distress. She has no wheezes. She has no rales.  Abdominal: Soft. She exhibits no distension.  Musculoskeletal: Normal range of motion.  Diffusely, exquisitely tender to palpation over L knee and L foot. Patient screams and swats me away with light palpation. Evaluation of swelling limited due to body habitus. Sensation intact. Compartment soft.  Small, superficial abrasion to left anterior knee.   Neurological: She is alert and oriented to person, place, and time. She has normal strength. Gait normal.  Gait is normal without antalgia or ataxia   Skin: Skin is warm and dry. She is not diaphoretic. No erythema.  Psychiatric: She has a normal mood and affect. Her behavior is normal.    ED Course  Procedures  DIAGNOSTIC STUDIES: Oxygen Saturation is 100% on RA, normal by my interpretation.    COORDINATION OF CARE: 12:08 AM-Discussed treatment plan which includes X-ray of L foot and ankle. Will administer Vicodin while in the ED. Pt agreed to plan.   Labs Review Labs Reviewed - No data to display  Imaging Review Dg Knee Complete 4 Views Left  11/13/2013   CLINICAL DATA:  Status post fall down steps.  Left knee pain.  EXAM: LEFT KNEE - COMPLETE 4+ VIEW  COMPARISON:  Left knee radiographs performed 05/07/2013  FINDINGS: There is no evidence of fracture or dislocation. The joint spaces are preserved. No significant degenerative change is seen; the patellofemoral joint is grossly unremarkable in appearance.  No significant joint effusion is seen. The visualized soft tissues are normal in appearance.  IMPRESSION: No evidence of fracture or dislocation.   Electronically Signed   By: Roanna Raider M.D.   On: 11/13/2013 01:42   Dg Foot Complete Left  11/13/2013   CLINICAL DATA:  Status post fall down steps.  Left toe pain.  EXAM: LEFT FOOT - COMPLETE  3+ VIEW  COMPARISON:  Left ankle radiographs performed 09/28/2009  FINDINGS: There is no evidence of fracture or dislocation. The joint spaces are preserved. There is no evidence of talar subluxation; the subtalar joint is unremarkable in appearance.  No significant soft tissue abnormalities are seen.  IMPRESSION: No evidence of fracture or dislocation.   Electronically Signed   By: Roanna Raider M.D.   On: 11/13/2013 01:42     EKG Interpretation None     MDM   Final diagnoses:  Left knee pain  Left foot pain  Fall    Patient presents to ED for evaluation of left knee and foot pain after a fall. XR are normal for  acute pathology. Patient is neurovascularly intact and compartment is soft. Suspect muscular pain. Patient was encouraged to wear her hinge brace. Patient ambulated in the department multiple times on her own accord with strong, non antalgic gait. Return instructions given. Vital signs stable for discharge. Patient / Family / Caregiver informed of clinical course, understand medical decision-making process, and agree with plan.   I personally performed the services described in this documentation, which was scribed in my presence. The recorded information has been reviewed and is accurate.    Mora Bellman, PA-C 11/14/13 1036

## 2013-11-13 ENCOUNTER — Encounter (HOSPITAL_COMMUNITY): Payer: Self-pay | Admitting: Emergency Medicine

## 2013-11-13 ENCOUNTER — Emergency Department (HOSPITAL_COMMUNITY): Payer: Medicaid Other

## 2013-11-13 MED ORDER — HYDROCODONE-ACETAMINOPHEN 5-325 MG PO TABS
2.0000 | ORAL_TABLET | Freq: Once | ORAL | Status: AC
  Administered 2013-11-13: 2 via ORAL
  Filled 2013-11-13: qty 2

## 2013-11-13 MED ORDER — HYDROCODONE-ACETAMINOPHEN 5-325 MG PO TABS: 1.0000 | ORAL_TABLET | Freq: Four times a day (QID) | ORAL | Status: DC | PRN

## 2013-11-13 MED ORDER — PROMETHAZINE HCL 25 MG PO TABS: 25.0000 mg | ORAL_TABLET | Freq: Four times a day (QID) | ORAL | Status: DC | PRN

## 2013-11-13 MED ORDER — ONDANSETRON 4 MG PO TBDP
8.0000 mg | ORAL_TABLET | Freq: Once | ORAL | Status: AC
  Administered 2013-11-13: 8 mg via ORAL
  Filled 2013-11-13: qty 2

## 2013-11-13 NOTE — Discharge Instructions (Signed)
Arthralgia Arthralgia is joint pain. A joint is a place where two bones meet. Joint pain can happen for many reasons. The joint can be bruised, stiff, infected, or weak from aging. Pain usually goes away after resting and taking medicine for soreness.  HOME CARE  Rest the joint as told by your doctor.  Keep the sore joint raised (elevated) for the first 24 hours.  Put ice on the joint area.  Put ice in a plastic bag.  Place a towel between your skin and the bag.  Leave the ice on for 15-20 minutes, 03-04 times a day.  Wear your splint, casting, elastic bandage, or sling as told by your doctor.  Only take medicine as told by your doctor. Do not take aspirin.  Use crutches as told by your doctor. Do not put weight on the joint until told to by your doctor. GET HELP RIGHT AWAY IF:   You have bruising, puffiness (swelling), or more pain.  Your fingers or toes turn blue or start to lose feeling (numb).  Your medicine does not lessen the pain.  Your pain becomes severe.  You have a temperature by mouth above 102 F (38.9 C), not controlled by medicine.  You cannot move or use the joint. MAKE SURE YOU:   Understand these instructions.  Will watch your condition.  Will get help right away if you are not doing well or get worse. Document Released: 06/22/2009 Document Revised: 09/26/2011 Document Reviewed: 06/22/2009 [facility] Patient Information 2014 [place], [place].  Fall Prevention and Home Safety Falls cause injuries and can affect all age groups. It is possible to use preventive measures to significantly decrease the likelihood of falls. There are many simple measures which can make your home safer and prevent falls. OUTDOORS  Repair cracks and edges of walkways and driveways.  Remove high doorway thresholds.  Trim shrubbery on the main path into your home.  Have good outside lighting.  Clear walkways of tools, rocks, debris, and clutter.  Check that handrails are  not broken and are securely fastened. Both sides of steps should have handrails.  Have leaves, snow, and ice cleared regularly.  Use sand or salt on walkways during winter months.  In the garage, clean up grease or oil spills. BATHROOM  Install night lights.  Install grab bars by the toilet and in the tub and shower.  Use non-skid mats or decals in the tub or shower.  Place a plastic non-slip stool in the shower to sit on, if needed.  Keep floors dry and clean up all water on the floor immediately.  Remove soap buildup in the tub or shower on a regular basis.  Secure bath mats with non-slip, double-sided rug tape.  Remove throw rugs and tripping hazards from the floors. BEDROOMS  Install night lights.  Make sure a bedside light is easy to reach.  Do not use oversized bedding.  Keep a telephone by your bedside.  Have a firm chair with side arms to use for getting dressed.  Remove throw rugs and tripping hazards from the floor. KITCHEN  Keep handles on pots and pans turned toward the center of the stove. Use back burners when possible.  Clean up spills quickly and allow time for drying.  Avoid walking on wet floors.  Avoid hot utensils and knives.  Position shelves so they are not too high or low.  Place commonly used objects within easy reach.  If necessary, use a sturdy step stool with a grab bar  when reaching.  Keep electrical cables out of the way.  Do not use floor polish or wax that makes floors slippery. If you must use wax, use non-skid floor wax.  Remove throw rugs and tripping hazards from the floor. STAIRWAYS  Never leave objects on stairs.  Place handrails on both sides of stairways and use them. Fix any loose handrails. Make sure handrails on both sides of the stairways are as long as the stairs.  Check carpeting to make sure it is firmly attached along stairs. Make repairs to worn or loose carpet promptly.  Avoid placing throw rugs at the  top or bottom of stairways, or properly secure the rug with carpet tape to prevent slippage. Get rid of throw rugs, if possible.  Have an electrician put in a light switch at the top and bottom of the stairs. OTHER FALL PREVENTION TIPS  Wear low-heel or rubber-soled shoes that are supportive and fit well. Wear closed toe shoes.  When using a stepladder, make sure it is fully opened and both spreaders are firmly locked. Do not climb a closed stepladder.  Add color or contrast paint or tape to grab bars and handrails in your home. Place contrasting color strips on first and last steps.  Learn and use mobility aids as needed. Install an electrical emergency response system.  Turn on lights to avoid dark areas. Replace light bulbs that burn out immediately. Get light switches that glow.  Arrange furniture to create clear pathways. Keep furniture in the same place.  Firmly attach carpet with non-skid or double-sided tape.  Eliminate uneven floor surfaces.  Select a carpet pattern that does not visually hide the edge of steps.  Be aware of all pets. OTHER HOME SAFETY TIPS  Set the water temperature for 120 F (48.8 C).  Keep emergency numbers on or near the telephone.  Keep smoke detectors on every level of the home and near sleeping areas. Document Released: 06/24/2002 Document Revised: 01/03/2012 Document Reviewed: 09/23/2011 [facility] Patient Information 2014 [place], [place].  Knee Pain Knee pain can be a result of an injury or other medical conditions. Treatment will depend on the cause of your pain. HOME CARE  Only take medicine as told by your doctor.  Keep a healthy weight. Being overweight can make the knee hurt more.  Stretch before exercising or playing sports.  If there is constant knee pain, change the way you exercise. Ask your doctor for advice.  Make sure shoes fit well. Choose the right shoe for the sport or activity.  Protect your knees. Wear kneepads if  needed.  Rest when you are tired. GET HELP RIGHT AWAY IF:   Your knee pain does not stop.  Your knee pain does not get better.  Your knee joint feels hot to the touch.  You have a fever. MAKE SURE YOU:   Understand these instructions.  Will watch this condition.  Will get help right away if you are not doing well or get worse. Document Released: 09/30/2008 Document Revised: 09/26/2011 Document Reviewed: 09/30/2008 [employer] Patient Information 2014 [place], [place].

## 2013-11-15 NOTE — ED Provider Notes (Signed)
Medical screening examination/treatment/procedure(s) were performed by non-physician practitioner and as supervising physician I was immediately available for consultation/collaboration.   EKG Interpretation None        [name], MD 11/15/13 [phone]

## 2014-01-25 ENCOUNTER — Encounter (HOSPITAL_COMMUNITY): Payer: Self-pay | Admitting: Emergency Medicine

## 2014-01-25 ENCOUNTER — Emergency Department (HOSPITAL_COMMUNITY)
Admission: EM | Admit: 2014-01-25 | Discharge: 2014-01-26 | Disposition: A | Discharge status: Home / Self Care | Payer: Medicaid Other | Attending: Emergency Medicine | Admitting: Emergency Medicine

## 2014-01-25 DIAGNOSIS — Z8669 Personal history of other diseases of the nervous system and sense organs: Secondary | ICD-10-CM | POA: Insufficient documentation

## 2014-01-25 DIAGNOSIS — Z79899 Other long term (current) drug therapy: Secondary | ICD-10-CM | POA: Insufficient documentation

## 2014-01-25 DIAGNOSIS — J45901 Unspecified asthma with (acute) exacerbation: Secondary | ICD-10-CM | POA: Insufficient documentation

## 2014-01-25 DIAGNOSIS — Z3202 Encounter for pregnancy test, result negative: Secondary | ICD-10-CM | POA: Insufficient documentation

## 2014-01-25 DIAGNOSIS — J069 Acute upper respiratory infection, unspecified: Secondary | ICD-10-CM | POA: Insufficient documentation

## 2014-01-25 DIAGNOSIS — F411 Generalized anxiety disorder: Secondary | ICD-10-CM | POA: Insufficient documentation

## 2014-01-25 NOTE — ED Notes (Addendum)
Per patient-SOB started last two weeks. Coughing starting today. Per significant other patient passed out today. Congestion started after 2 asthma attacks Thursday and one on Friday. Uses albuterol. Used rescue inhaler today. C/o right upper chest pain radiating to shoulder today. C/o throat pain. Lost voice today.  Hx asthma, seizures. No hx cardiac issues. Denies abdominal pain, N, V, D, chills. Denies being around anyone sick. Speaking full, clear sentences. Ambulatory and moving all extremities.

## 2014-01-26 ENCOUNTER — Emergency Department (HOSPITAL_COMMUNITY): Payer: Medicaid Other

## 2014-01-26 LAB — POC URINE PREG, ED: PREG TEST UR: NEGATIVE

## 2014-01-26 MED ORDER — OXYMETAZOLINE HCL 0.05 % NA SOLN: 1.0000 | Freq: Two times a day (BID) | NASAL | Status: DC

## 2014-01-26 MED ORDER — BENZONATATE 200 MG PO CAPS: 200.0000 mg | ORAL_CAPSULE | Freq: Three times a day (TID) | ORAL | Status: DC | PRN

## 2014-01-26 MED ORDER — GUAIFENESIN ER 600 MG PO TB12: 1200.0000 mg | ORAL_TABLET | Freq: Two times a day (BID) | ORAL | Status: DC

## 2014-01-26 MED ORDER — PREDNISONE 20 MG PO TABS
60.0000 mg | ORAL_TABLET | Freq: Once | ORAL | Status: AC
  Administered 2014-01-26: 60 mg via ORAL
  Filled 2014-01-26: qty 3

## 2014-01-26 MED ORDER — ALBUTEROL SULFATE HFA 108 (90 BASE) MCG/ACT IN AERS
1.0000 | INHALATION_SPRAY | Freq: Four times a day (QID) | RESPIRATORY_TRACT | Status: DC | PRN
  Filled 2014-01-26: qty 6.7

## 2014-01-26 MED ORDER — BENZONATATE 100 MG PO CAPS
200.0000 mg | ORAL_CAPSULE | Freq: Three times a day (TID) | ORAL | Status: DC | PRN
  Administered 2014-01-26: 200 mg via ORAL
  Filled 2014-01-26: qty 2

## 2014-01-26 MED ORDER — PREDNISONE 20 MG PO TABS: 60.0000 mg | ORAL_TABLET | Freq: Every day | ORAL | Status: DC

## 2014-01-26 MED ORDER — GUAIFENESIN ER 600 MG PO TB12
1200.0000 mg | ORAL_TABLET | Freq: Two times a day (BID) | ORAL | Status: DC
  Administered 2014-01-26: 1200 mg via ORAL
  Filled 2014-01-26: qty 2

## 2014-01-26 MED ORDER — OXYMETAZOLINE HCL 0.05 % NA SOLN
1.0000 | Freq: Once | NASAL | Status: AC
  Administered 2014-01-26: 1 via NASAL
  Filled 2014-01-26: qty 15

## 2014-01-26 MED ORDER — ALBUTEROL SULFATE (2.5 MG/3ML) 0.083% IN NEBU
2.5000 mg | INHALATION_SOLUTION | Freq: Once | RESPIRATORY_TRACT | Status: AC
  Administered 2014-01-26: 2.5 mg via RESPIRATORY_TRACT
  Filled 2014-01-26: qty 3

## 2014-01-26 MED ORDER — HYDROCOD POLST-CHLORPHEN POLST 10-8 MG/5ML PO LQCR
5.0000 mL | Freq: Once | ORAL | Status: AC
  Administered 2014-01-26: 5 mL via ORAL
  Filled 2014-01-26: qty 5

## 2014-01-26 MED ORDER — HYDROCOD POLST-CHLORPHEN POLST 10-8 MG/5ML PO LQCR: 5.0000 mL | Freq: Every evening | ORAL | Status: DC | PRN

## 2014-01-26 NOTE — ED Notes (Signed)
Patient is able to talk and breathe normal but still having a little trouble breathing due to being stuffy. Patient stated she might have a sinus infection.

## 2014-01-26 NOTE — ED Notes (Signed)
Patient stated she can breathe now.

## 2014-01-26 NOTE — ED Notes (Signed)
Report given to Jennifer.

## 2014-01-26 NOTE — ED Provider Notes (Signed)
CSN: [id number]     Arrival date & time 01/25/14  2254 History   First MD Initiated Contact with Patient 01/26/14 0225     Chief Complaint  Patient presents with  . Shortness of Breath  . Congestion      (Consider location/radiation/quality/duration/timing/severity/associated sxs/prior Treatment) HPI 28 year old female presents emergency apartment with complaint of 4-6 weeks of worsening asthma symptoms with wheezing cough and congestion.  She reports that her asthma has been well-controlled for many years, only needing her inhaler once every few weeks to months.  She reports in the last 6 weeks she has been using her rescue inhaler daily.  In the last 3-4 days she has had increased sputum, and increased cough.  Patient reports loss of voice and hoarseness.  Today she started with nasal congestion and stuffiness.  Feels as though she cannot breathe.  No fevers or chills.  She does not have a primary care Dr.  Patient has had some severe coughing spells where she cannot catch her breath.  Patient's boyfriend reports while they were driving today patient had prolonged coughing spell and seen to pass out at the end of it.  He reports she was out for no more than 5 seconds.  Patient has been taking her inhaler.  No over-the-counter medications Past Medical History  Diagnosis Date  . Asthma   . Obesity   . Seizures   . Obesity   . Anxiety    Past Surgical History  Procedure Laterality Date  . Tonsillectomy     Family History  Problem Relation Age of Onset  . Hypothyroidism Mother   . Diabetes Father   . Diabetes Paternal Uncle   . Hypertension Maternal Grandmother   . Hypertension Maternal Grandfather   . Hypertension Paternal Grandmother   . Diabetes Paternal Grandfather   . Heart disease Paternal Grandfather   . Hypertension Paternal Grandfather    History  Substance Use Topics  . Smoking status: Never Smoker   . Smokeless tobacco: Never Used  . Alcohol Use: No   OB History    Grav Para Term Preterm Abortions TAB SAB Ect Mult Living   1    1  1    0     Review of Systems   See History of Present Illness; otherwise all other systems are reviewed and negative  Allergies  Bee venom; Cinnamon; and Ibuprofen  Home Medications   Prior to Admission medications   Medication Sig Start Date End Date Taking? Authorizing Provider  albuterol (PROVENTIL HFA;VENTOLIN HFA) 108 (90 BASE) MCG/ACT inhaler Inhale 2 puffs into the lungs every 6 (six) hours as needed for shortness of breath. For shortness of breath.   Yes Historical Provider, MD  celecoxib (CELEBREX) 200 MG capsule Take 200 mg by mouth daily.    Yes Historical Provider, MD  cyclobenzaprine (FLEXERIL) 10 MG tablet Take 10 mg by mouth at bedtime.   Yes Historical Provider, MD  methocarbamol (ROBAXIN) 750 MG tablet Take 750 mg by mouth 2 (two) times daily.   Yes Historical Provider, MD  traMADol (ULTRAM-ER) 100 MG 24 hr tablet Take 200 mg by mouth daily as needed for pain.    Yes Historical Provider, MD  benzonatate (TESSALON) 200 MG capsule Take 1 capsule (200 mg total) by mouth 3 (three) times daily as needed for cough. 01/26/14   03/29/14, MD  chlorpheniramine-HYDROcodone (TUSSIONEX) 10-8 MG/5ML LQCR Take 5 mLs by mouth at bedtime as needed for cough. 01/26/14   03/29/14  Norlene Campbell, MD  guaiFENesin (MUCINEX) 600 MG 12 hr tablet Take 2 tablets (1,200 mg total) by mouth 2 (two) times daily. 01/26/14   Olivia Mackie, MD  oxymetazoline (AFRIN NASAL SPRAY) 0.05 % nasal spray Place 1 spray into both nostrils 2 (two) times daily. For 3 days only 01/26/14   Olivia Mackie, MD  predniSONE (DELTASONE) 20 MG tablet Take 3 tablets (60 mg total) by mouth daily. 01/26/14   Olivia Mackie, MD   BP 159/68  Pulse 84  Temp(Src) 97.6 F (36.4 C) (Oral)  Resp 19  SpO2 100%  LMP 12/15/2013 Physical Exam  Nursing note and vitals reviewed. Constitutional: She is oriented to person, place, and time. She appears well-developed and well-nourished.   Morbidly obese female, uncomfortable appearing  HENT:  Head: Normocephalic and atraumatic.  Right Ear: External ear normal.  Left Ear: External ear normal.  Mouth/Throat: Oropharynx is clear and moist.  Nasal congestion  Eyes: Conjunctivae and EOM are normal. Pupils are equal, round, and reactive to light.  Neck: Normal range of motion. Neck supple. No JVD present. No tracheal deviation present. No thyromegaly present.  Cardiovascular: Normal rate, regular rhythm, normal heart sounds and intact distal pulses.  Exam reveals no gallop and no friction rub.   No murmur heard. Pulmonary/Chest: Effort normal and breath sounds normal. No stridor. No respiratory distress. She has no wheezes. She has no rales. She exhibits no tenderness.  Productive cough, end expiratory wheezing  Abdominal: Soft. Bowel sounds are normal. She exhibits no distension and no mass. There is no tenderness. There is no rebound and no guarding.  Musculoskeletal: Normal range of motion. She exhibits no edema and no tenderness.  Lymphadenopathy:    She has no cervical adenopathy.  Neurological: She is alert and oriented to person, place, and time. She exhibits normal muscle tone. Coordination normal.  Skin: Skin is warm and dry. No rash noted. No erythema. No pallor.  Psychiatric: She has a normal mood and affect. Her behavior is normal. Judgment and thought content normal.    ED Course  Procedures (including critical care time) Labs Review Labs Reviewed  POC URINE PREG, ED    Imaging Review Dg Chest 2 View  01/26/2014   CLINICAL DATA:  Cough and shortness of breath.  EXAM: CHEST  2 VIEW  COMPARISON:  07/29/2013  FINDINGS: Shallow inspiration. The heart size and mediastinal contours are within normal limits. Both lungs are clear. The visualized skeletal structures are unremarkable.  IMPRESSION: No active cardiopulmonary disease.   Electronically Signed   By: Burman Nieves M.D.   On: 01/26/2014 02:42     EKG  Interpretation None      MDM   Final diagnoses:  Upper respiratory infection  Asthma exacerbation   28 year old female with ongoing asthma issues now with what sounds to be a URI.  Patient encouraged to followup a local primary care provider.  Symptomatic care for her your I., along with a pulse of steroids to help with her underlying bronchospasm and lung inflammation  Olivia Mackie, MD 01/26/14 [phone]

## 2014-01-26 NOTE — Discharge Instructions (Signed)
Asthma Asthma is a recurring condition in which the airways tighten and narrow. Asthma can make it difficult to breathe. It can cause coughing, wheezing, and shortness of breath. Asthma episodes, also called asthma attacks, range from minor to life-threatening. Asthma cannot be cured, but medicines and lifestyle changes can help control it. CAUSES Asthma is believed to be caused by inherited (genetic) and environmental factors, but its exact cause is unknown. Asthma may be triggered by allergens, lung infections, or irritants in the air. Asthma triggers are different for each person. Common triggers include:   Animal dander.  Dust mites.  Cockroaches.  Pollen from trees or grass.  Mold.  Smoke.  Air pollutants such as dust, household cleaners, hair sprays, aerosol sprays, paint fumes, strong chemicals, or strong odors.  Cold air, weather changes, and winds (which increase molds and pollens in the air).  Strong emotional expressions such as crying or laughing hard.  Stress.  Certain medicines (such as aspirin) or types of drugs (such as beta-blockers).  Sulfites in foods and drinks. Foods and drinks that may contain sulfites include dried fruit, potato chips, and sparkling grape juice.  Infections or inflammatory conditions such as the flu, a cold, or an inflammation of the nasal membranes (rhinitis).  Gastroesophageal reflux disease (GERD).  Exercise or strenuous activity. SYMPTOMS Symptoms may occur immediately after asthma is triggered or many hours later. Symptoms include:  Wheezing.  Excessive nighttime or early morning coughing.  Frequent or severe coughing with a common cold.  Chest tightness.  Shortness of breath. DIAGNOSIS  The diagnosis of asthma is made by a review of your medical history and a physical exam. Tests may also be performed. These may include:  Lung function studies. These tests show how much air you breathe in and out.  Allergy  tests.  Imaging tests such as X-rays. TREATMENT  Asthma cannot be cured, but it can usually be controlled. Treatment involves identifying and avoiding your asthma triggers. It also involves medicines. There are 2 classes of medicine used for asthma treatment:   Controller medicines. These prevent asthma symptoms from occurring. They are usually taken every day.  Reliever or rescue medicines. These quickly relieve asthma symptoms. They are used as needed and provide short-term relief. Your health care provider will help you create an asthma action plan. An asthma action plan is a written plan for managing and treating your asthma attacks. It includes a list of your asthma triggers and how they may be avoided. It also includes information on when medicines should be taken and when their dosage should be changed. An action plan may also involve the use of a device called a peak flow meter. A peak flow meter measures how well the lungs are working. It helps you monitor your condition. HOME CARE INSTRUCTIONS   Take medicine as directed by your health care provider. Speak with your health care provider if you have questions about how or when to take the medicines.  Use a peak flow meter as directed by your health care provider. Record and keep track of readings.  Understand and use the action plan to help minimize or stop an asthma attack without needing to seek medical care.  Control your home environment in the following ways to help prevent asthma attacks:  Do not smoke. Avoid being exposed to secondhand smoke.  Change your heating and air conditioning filter regularly.  Limit your use of fireplaces and wood stoves.  Get rid of pests (such as roaches and mice)  and their droppings.  Throw away plants if you see mold on them.  Clean your floors and dust regularly. Use unscented cleaning products.  Try to have someone else vacuum for you regularly. Stay out of rooms while they are being  vacuumed and for a short while afterward. If you vacuum, use a dust mask from a hardware store, a double-layered or microfilter vacuum cleaner bag, or a vacuum cleaner with a HEPA filter.  Replace carpet with wood, tile, or vinyl flooring. Carpet can trap dander and dust.  Use allergy-proof pillows, mattress covers, and box spring covers.  Wash bed sheets and blankets every week in hot water and dry them in a dryer.  Use blankets that are made of polyester or cotton.  Clean bathrooms and kitchens with bleach. If possible, have someone repaint the walls in these rooms with mold-resistant paint. Keep out of the rooms that are being cleaned and painted.  Wash hands frequently. SEEK MEDICAL CARE IF:   You have wheezing, shortness of breath, or a cough even if taking medicine to prevent attacks.  The colored mucus you cough up (sputum) is thicker than usual.  Your sputum changes from clear or white to yellow, green, gray, or bloody.  You have any problems that may be related to the medicines you are taking (such as a rash, itching, swelling, or trouble breathing).  You are using a reliever medicine more than 2-3 times per week.  Your peak flow is still at 50-79% of your personal best after following your action plan for 1 hour. SEEK IMMEDIATE MEDICAL CARE IF:   You seem to be getting worse and are unresponsive to treatment during an asthma attack.  You are short of breath even at rest.  You get short of breath when doing very little physical activity.  You have difficulty eating, drinking, or talking due to asthma symptoms.  You develop chest pain.  You develop a fast heartbeat.  You have a bluish color to your lips or fingernails.  You are lightheaded, dizzy, or faint.  Your peak flow is less than 50% of your personal best.  You have a fever or persistent symptoms for more than 2-3 days.  You have a fever and symptoms suddenly get worse. MAKE SURE YOU:   Understand  these instructions.  Will watch your condition.  Will get help right away if you are not doing well or get worse. Document Released: 07/04/2005 Document Revised: 07/09/2013 Document Reviewed: 01/31/2013 [facility] Patient Information 2015 [place], [place]. This information is not intended to replace advice given to you by your health care provider. Make sure you discuss any questions you have with your health care provider.  Upper Respiratory Infection, Adult An upper respiratory infection (URI) is also sometimes known as the common cold. The upper respiratory tract includes the nose, sinuses, throat, trachea, and bronchi. Bronchi are the airways leading to the lungs. Most people improve within 1 week, but symptoms can last up to 2 weeks. A residual cough may last even longer.  CAUSES Many different viruses can infect the tissues lining the upper respiratory tract. The tissues become irritated and inflamed and often become very moist. Mucus production is also common. A cold is contagious. You can easily spread the virus to others by oral contact. This includes kissing, sharing a glass, coughing, or sneezing. Touching your mouth or nose and then touching a surface, which is then touched by another person, can also spread the virus. SYMPTOMS  Symptoms typically develop  1 to 3 days after you come in contact with a cold virus. Symptoms vary from person to person. They may include:  Runny nose.  Sneezing.  Nasal congestion.  Sinus irritation.  Sore throat.  Loss of voice (laryngitis).  Cough.  Fatigue.  Muscle aches.  Loss of appetite.  Headache.  Low-grade fever. DIAGNOSIS  You might diagnose your own cold based on familiar symptoms, since most people get a cold 2 to 3 times a year. Your caregiver can confirm this based on your exam. Most importantly, your caregiver can check that your symptoms are not due to another disease such as strep throat, sinusitis, pneumonia, asthma, or  epiglottitis. Blood tests, throat tests, and X-rays are not necessary to diagnose a common cold, but they may sometimes be helpful in excluding other more serious diseases. Your caregiver will decide if any further tests are required. RISKS AND COMPLICATIONS  You may be at risk for a more severe case of the common cold if you smoke cigarettes, have chronic heart disease (such as heart failure) or lung disease (such as asthma), or if you have a weakened immune system. The very young and very old are also at risk for more serious infections. Bacterial sinusitis, middle ear infections, and bacterial pneumonia can complicate the common cold. The common cold can worsen asthma and chronic obstructive pulmonary disease (COPD). Sometimes, these complications can require emergency medical care and may be life-threatening. PREVENTION  The best way to protect against getting a cold is to practice good hygiene. Avoid oral or hand contact with people with cold symptoms. Wash your hands often if contact occurs. There is no clear evidence that vitamin C, vitamin E, echinacea, or exercise reduces the chance of developing a cold. However, it is always recommended to get plenty of rest and practice good nutrition. TREATMENT  Treatment is directed at relieving symptoms. There is no cure. Antibiotics are not effective, because the infection is caused by a virus, not by bacteria. Treatment may include:  Increased fluid intake. Sports drinks offer valuable electrolytes, sugars, and fluids.  Breathing heated mist or steam (vaporizer or shower).  Eating chicken soup or other clear broths, and maintaining good nutrition.  Getting plenty of rest.  Using gargles or lozenges for comfort.  Controlling fevers with ibuprofen or acetaminophen as directed by your caregiver.  Increasing usage of your inhaler if you have asthma. Zinc gel and zinc lozenges, taken in the first 24 hours of the common cold, can shorten the duration  and lessen the severity of symptoms. Pain medicines may help with fever, muscle aches, and throat pain. A variety of non-prescription medicines are available to treat congestion and runny nose. Your caregiver can make recommendations and may suggest nasal or lung inhalers for other symptoms.  HOME CARE INSTRUCTIONS   Only take over-the-counter or prescription medicines for pain, discomfort, or fever as directed by your caregiver.  Use a warm mist humidifier or inhale steam from a shower to increase air moisture. This may keep secretions moist and make it easier to breathe.  Drink enough water and fluids to keep your urine clear or pale yellow.  Rest as needed.  Return to work when your temperature has returned to normal or as your caregiver advises. You may need to stay home longer to avoid infecting others. You can also use a face mask and careful hand washing to prevent spread of the virus. SEEK MEDICAL CARE IF:   After the first few days, you feel you  are getting worse rather than better.  You need your caregiver's advice about medicines to control symptoms.  You develop chills, worsening shortness of breath, or brown or red sputum. These may be signs of pneumonia.  You develop yellow or brown nasal discharge or pain in the face, especially when you bend forward. These may be signs of sinusitis.  You develop a fever, swollen neck glands, pain with swallowing, or white areas in the back of your throat. These may be signs of strep throat. SEEK IMMEDIATE MEDICAL CARE IF:   You have a fever.  You develop severe or persistent headache, ear pain, sinus pain, or chest pain.  You develop wheezing, a prolonged cough, cough up blood, or have a change in your usual mucus (if you have chronic lung disease).  You develop sore muscles or a stiff neck. Document Released: 12/28/2000 Document Revised: 09/26/2011 Document Reviewed: 11/05/2010 [facility] Patient Information 2015 [place], [place]. This  information is not intended to replace advice given to you by your health care provider. Make sure you discuss any questions you have with your health care provider.

## 2014-01-28 ENCOUNTER — Emergency Department (HOSPITAL_BASED_OUTPATIENT_CLINIC_OR_DEPARTMENT_OTHER): Payer: Self-pay

## 2014-01-28 ENCOUNTER — Emergency Department (HOSPITAL_BASED_OUTPATIENT_CLINIC_OR_DEPARTMENT_OTHER)
Admission: EM | Admit: 2014-01-28 | Discharge: 2014-01-29 | Disposition: A | Discharge status: Home / Self Care | Payer: Self-pay | Attending: Emergency Medicine | Admitting: Emergency Medicine

## 2014-01-28 ENCOUNTER — Encounter (HOSPITAL_BASED_OUTPATIENT_CLINIC_OR_DEPARTMENT_OTHER): Payer: Self-pay | Admitting: Emergency Medicine

## 2014-01-28 DIAGNOSIS — F411 Generalized anxiety disorder: Secondary | ICD-10-CM | POA: Insufficient documentation

## 2014-01-28 DIAGNOSIS — S0990XA Unspecified injury of head, initial encounter: Secondary | ICD-10-CM

## 2014-01-28 DIAGNOSIS — S161XXA Strain of muscle, fascia and tendon at neck level, initial encounter: Secondary | ICD-10-CM

## 2014-01-28 DIAGNOSIS — Y9289 Other specified places as the place of occurrence of the external cause: Secondary | ICD-10-CM | POA: Insufficient documentation

## 2014-01-28 DIAGNOSIS — S29019A Strain of muscle and tendon of unspecified wall of thorax, initial encounter: Secondary | ICD-10-CM

## 2014-01-28 DIAGNOSIS — R42 Dizziness and giddiness: Secondary | ICD-10-CM | POA: Insufficient documentation

## 2014-01-28 DIAGNOSIS — J45909 Unspecified asthma, uncomplicated: Secondary | ICD-10-CM | POA: Insufficient documentation

## 2014-01-28 DIAGNOSIS — S239XXA Sprain of unspecified parts of thorax, initial encounter: Secondary | ICD-10-CM | POA: Insufficient documentation

## 2014-01-28 DIAGNOSIS — IMO0002 Reserved for concepts with insufficient information to code with codable children: Secondary | ICD-10-CM | POA: Insufficient documentation

## 2014-01-28 DIAGNOSIS — Z8669 Personal history of other diseases of the nervous system and sense organs: Secondary | ICD-10-CM | POA: Insufficient documentation

## 2014-01-28 DIAGNOSIS — S139XXA Sprain of joints and ligaments of unspecified parts of neck, initial encounter: Secondary | ICD-10-CM | POA: Insufficient documentation

## 2014-01-28 DIAGNOSIS — Y9389 Activity, other specified: Secondary | ICD-10-CM | POA: Insufficient documentation

## 2014-01-28 DIAGNOSIS — Z79899 Other long term (current) drug therapy: Secondary | ICD-10-CM | POA: Insufficient documentation

## 2014-01-28 DIAGNOSIS — E669 Obesity, unspecified: Secondary | ICD-10-CM | POA: Insufficient documentation

## 2014-01-28 MED ORDER — TRAMADOL HCL 50 MG PO TABS
50.0000 mg | ORAL_TABLET | Freq: Once | ORAL | Status: AC
  Administered 2014-01-28: 50 mg via ORAL
  Filled 2014-01-28: qty 1
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
## 2014-01-29 MED ORDER — TRAMADOL HCL 50 MG PO TABS: 50.0000 mg | ORAL_TABLET | Freq: Four times a day (QID) | ORAL | Status: DC | PRN

## 2014-01-29 NOTE — Discharge Instructions (Signed)
Tramadol as prescribed as needed for pain.  Return to the emergency department if you develop worsening headache, pain, or other new and concerning symptoms.   Followup with your primary Dr. if not improving in the next week.   Concussion A concussion, or closed-head injury, is a brain injury caused by a direct blow to the head or by a quick and sudden movement (jolt) of the head or neck. Concussions are usually not life-threatening. Even so, the effects of a concussion can be serious. If you have had a concussion before, you are more likely to experience concussion-like symptoms after a direct blow to the head.  CAUSES  Direct blow to the head, such as from running into another player during a soccer game, being hit in a fight, or hitting your head on a hard surface.  A jolt of the head or neck that causes the brain to move back and forth inside the skull, such as in a car crash. SIGNS AND SYMPTOMS The signs of a concussion can be hard to notice. Early on, they may be missed by you, family members, and health care providers. You may look fine but act or feel differently. Symptoms are usually temporary, but they may last for days, weeks, or even longer. Some symptoms may appear right away while others may not show up for hours or days. Every head injury is different. Symptoms include:  Mild to moderate headaches that will not go away.  A feeling of pressure inside your head.  Having more trouble than usual:  Learning or remembering things you have heard.  Answering questions.  Paying attention or concentrating.  Organizing daily tasks.  Making decisions and solving problems.  Slowness in thinking, acting or reacting, speaking, or reading.  Getting lost or being easily confused.  Feeling tired all the time or lacking energy (fatigued).  Feeling drowsy.  Sleep disturbances.  Sleeping more than usual.  Sleeping less than usual.  Trouble falling asleep.  Trouble sleeping  (insomnia).  Loss of balance or feeling lightheaded or dizzy.  Nausea or vomiting.  Numbness or tingling.  Increased sensitivity to:  Sounds.  Lights.  Distractions.  Vision problems or eyes that tire easily.  Diminished sense of taste or smell.  Ringing in the ears.  Mood changes such as feeling sad or anxious.  Becoming easily irritated or angry for little or no reason.  Lack of motivation.  Seeing or hearing things other people do not see or hear (hallucinations). DIAGNOSIS Your health care provider can usually diagnose a concussion based on a description of your injury and symptoms. He or she will ask whether you passed out (lost consciousness) and whether you are having trouble remembering events that happened right before and during your injury. Your evaluation might include:  A brain scan to look for signs of injury to the brain. Even if the test shows no injury, you may still have a concussion.  Blood tests to be sure other problems are not present. TREATMENT  Concussions are usually treated in an emergency department, in urgent care, or at a clinic. You may need to stay in the hospital overnight for further treatment.  Tell your health care provider if you are taking any medicines, including prescription medicines, over-the-counter medicines, and natural remedies. Some medicines, such as blood thinners (anticoagulants) and aspirin, may increase the chance of complications. Also tell your health care provider whether you have had alcohol or are taking illegal drugs. This information may affect treatment.  Your  health care provider will send you home with important instructions to follow.  How fast you will recover from a concussion depends on many factors. These factors include how severe your concussion is, what part of your brain was injured, your age, and how healthy you were before the concussion.  Most people with mild injuries recover fully. Recovery can  take time. In general, recovery is slower in older persons. Also, persons who have had a concussion in the past or have other medical problems may find that it takes longer to recover from their current injury. HOME CARE INSTRUCTIONS General Instructions  Carefully follow the directions your health care provider gave you.  Only take over-the-counter or prescription medicines for pain, discomfort, or fever as directed by your health care provider.  Take only those medicines that your health care provider has approved.  Do not drink alcohol until your health care provider says you are well enough to do so. Alcohol and certain other drugs may slow your recovery and can put you at risk of further injury.  If it is harder than usual to remember things, write them down.  If you are easily distracted, try to do one thing at a time. For example, do not try to watch TV while fixing dinner.  Talk with family members or close friends when making important decisions.  Keep all follow-up appointments. Repeated evaluation of your symptoms is recommended for your recovery.  Watch your symptoms and tell others to do the same. Complications sometimes occur after a concussion. Older adults with a brain injury may have a higher risk of serious complications, such as a blood clot on the brain.  Tell your teachers, school nurse, school counselor, coach, athletic trainer, or work Freight forwarder about your injury, symptoms, and restrictions. Tell them about what you can or cannot do. They should watch for:  Increased problems with attention or concentration.  Increased difficulty remembering or learning new information.  Increased time needed to complete tasks or assignments.  Increased irritability or decreased ability to cope with stress.  Increased symptoms.  Rest. Rest helps the brain to heal. Make sure you:  Get plenty of sleep at night. Avoid staying up late at night.  Keep the same bedtime hours on  weekends and weekdays.  Rest during the day. Take daytime naps or rest breaks when you feel tired.  Limit activities that require a lot of thought or concentration. These include:  Doing homework or job-related work.  Watching TV.  Working on the computer.  Avoid any situation where there is potential for another head injury (football, hockey, soccer, basketball, martial arts, downhill snow sports and horseback riding). Your condition will get worse every time you experience a concussion. You should avoid these activities until you are evaluated by the appropriate follow-up health care providers. Returning To Your Regular Activities You will need to return to your normal activities slowly, not all at once. You must give your body and brain enough time for recovery.  Do not return to sports or other athletic activities until your health care provider tells you it is safe to do so.  Ask your health care provider when you can drive, ride a bicycle, or operate heavy machinery. Your ability to react may be slower after a brain injury. Never do these activities if you are dizzy.  Ask your health care provider about when you can return to work or school. Preventing Another Concussion It is very important to avoid another brain injury, especially  before you have recovered. In rare cases, another injury can lead to permanent brain damage, brain swelling, or death. The risk of this is greatest during the first 7-10 days after a head injury. Avoid injuries by:  Wearing a seat belt when riding in a car.  Drinking alcohol only in moderation.  Wearing a helmet when biking, skiing, skateboarding, skating, or doing similar activities.  Avoiding activities that could lead to a second concussion, such as contact or recreational sports, until your health care provider says it is okay.  Taking safety measures in your home.  Remove clutter and tripping hazards from floors and stairways.  Use grab bars  in bathrooms and handrails by stairs.  Place non-slip mats on floors and in bathtubs.  Improve lighting in dim areas. SEEK MEDICAL CARE IF:  You have increased problems paying attention or concentrating.  You have increased difficulty remembering or learning new information.  You need more time to complete tasks or assignments than before.  You have increased irritability or decreased ability to cope with stress.  You have more symptoms than before. Seek medical care if you have any of the following symptoms for more than 2 weeks after your injury:  Lasting (chronic) headaches.  Dizziness or balance problems.  Nausea.  Vision problems.  Increased sensitivity to noise or light.  Depression or mood swings.  Anxiety or irritability.  Memory problems.  Difficulty concentrating or paying attention.  Sleep problems.  Feeling tired all the time. SEEK IMMEDIATE MEDICAL CARE IF:  You have severe or worsening headaches. These may be a sign of a blood clot in the brain.  You have weakness (even if only in one hand, leg, or part of the face).  You have numbness.  You have decreased coordination.  You vomit repeatedly.  You have increased sleepiness.  One pupil is larger than the other.  You have convulsions.  You have slurred speech.  You have increased confusion. This may be a sign of a blood clot in the brain.  You have increased restlessness, agitation, or irritability.  You are unable to recognize people or places.  You have neck pain.  It is difficult to wake you up.  You have unusual behavior changes.  You lose consciousness. MAKE SURE YOU:  Understand these instructions.  Will watch your condition.  Will get help right away if you are not doing well or get worse. Document Released: 09/24/2003 Document Revised: 07/09/2013 Document Reviewed: 01/24/2013 [facility] Patient Information 2015 [place], [place]. This information is not intended to  replace advice given to you by your health care provider. Make sure you discuss any questions you have with your health care provider.  Cervical Sprain A cervical sprain is an injury in the neck in which the strong, fibrous tissues (ligaments) that connect your neck bones stretch or tear. Cervical sprains can range from mild to severe. Severe cervical sprains can cause the neck vertebrae to be unstable. This can lead to damage of the spinal cord and can result in serious nervous system problems. The amount of time it takes for a cervical sprain to get better depends on the cause and extent of the injury. Most cervical sprains heal in 1 to 3 weeks. CAUSES  Severe cervical sprains may be caused by:   Contact sport injuries (such as from football, rugby, wrestling, hockey, auto racing, gymnastics, diving, martial arts, or boxing).   Motor vehicle collisions.   Whiplash injuries. This is an injury from a sudden forward and  backward whipping movement of the head and neck.  Falls.  Mild cervical sprains may be caused by:   Being in an awkward position, such as while cradling a telephone between your ear and shoulder.   Sitting in a chair that does not offer proper support.   Working at a poorly Marketing executivedesigned computer station.   Looking up or down for long periods of time.  SYMPTOMS   Pain, soreness, stiffness, or a burning sensation in the front, back, or sides of the neck. This discomfort may develop immediately after the injury or slowly, 24 hours or more after the injury.   Pain or tenderness directly in the middle of the back of the neck.   Shoulder or upper back pain.   Limited ability to move the neck.   Headache.   Dizziness.   Weakness, numbness, or tingling in the hands or arms.   Muscle spasms.   Difficulty swallowing or chewing.   Tenderness and swelling of the neck.  DIAGNOSIS  Most of the time your health care provider can diagnose a cervical sprain by  taking your history and doing a physical exam. Your health care provider will ask about previous neck injuries and any known neck problems, such as arthritis in the neck. X-rays may be taken to find out if there are any other problems, such as with the bones of the neck. Other tests, such as a CT scan or MRI, may also be needed.  TREATMENT  Treatment depends on the severity of the cervical sprain. Mild sprains can be treated with rest, keeping the neck in place (immobilization), and pain medicines. Severe cervical sprains are immediately immobilized. Further treatment is done to help with pain, muscle spasms, and other symptoms and may include:  Medicines, such as pain relievers, numbing medicines, or muscle relaxants.   Physical therapy. This may involve stretching exercises, strengthening exercises, and posture training. Exercises and improved posture can help stabilize the neck, strengthen muscles, and help stop symptoms from returning.  HOME CARE INSTRUCTIONS   Put ice on the injured area.   Put ice in a plastic bag.   Place a towel between your skin and the bag.   Leave the ice on for 15-20 minutes, 3-4 times a day.   If your injury was severe, you may have been given a cervical collar to wear. A cervical collar is a two-piece collar designed to keep your neck from moving while it heals.  Do not remove the collar unless instructed by your health care provider.  If you have long hair, keep it outside of the collar.  Ask your health care provider before making any adjustments to your collar. Minor adjustments may be required over time to improve comfort and reduce pressure on your chin or on the back of your head.  Ifyou are allowed to remove the collar for cleaning or bathing, follow your health care provider's instructions on how to do so safely.  Keep your collar clean by wiping it with mild soap and water and drying it completely. If the collar you have been given includes  removable pads, remove them every 1-2 days and hand wash them with soap and water. Allow them to air dry. They should be completely dry before you wear them in the collar.  If you are allowed to remove the collar for cleaning and bathing, wash and dry the skin of your neck. Check your skin for irritation or sores. If you see any, tell your health care  provider.  Do not drive while wearing the collar.   Only take over-the-counter or prescription medicines for pain, discomfort, or fever as directed by your health care provider.   Keep all follow-up appointments as directed by your health care provider.   Keep all physical therapy appointments as directed by your health care provider.   Make any needed adjustments to your workstation to promote good posture.   Avoid positions and activities that make your symptoms worse.   Warm up and stretch before being active to help prevent problems.  SEEK MEDICAL CARE IF:   Your pain is not controlled with medicine.   You are unable to decrease your pain medicine over time as planned.   Your activity level is not improving as expected.  SEEK IMMEDIATE MEDICAL CARE IF:   You develop any bleeding.  You develop stomach upset.  You have signs of an allergic reaction to your medicine.   Your symptoms get worse.   You develop new, unexplained symptoms.   You have numbness, tingling, weakness, or paralysis in any part of your body.  MAKE SURE YOU:   Understand these instructions.  Will watch your condition.  Will get help right away if you are not doing well or get worse. Document Released: 05/01/2007 Document Revised: 07/09/2013 Document Reviewed: 01/09/2013 [facility] Patient Information 2015 [place], [place]. This information is not intended to replace advice given to you by your health care provider. Make sure you discuss any questions you have with your health care provider.

## 2014-03-20 ENCOUNTER — Encounter (HOSPITAL_COMMUNITY): Payer: Self-pay | Admitting: Emergency Medicine

## 2014-03-20 ENCOUNTER — Emergency Department (HOSPITAL_COMMUNITY)
Admission: EM | Admit: 2014-03-20 | Discharge: 2014-03-20 | Discharge status: Against Medical Advice | Payer: Self-pay | Attending: Emergency Medicine | Admitting: Emergency Medicine

## 2014-03-20 DIAGNOSIS — R079 Chest pain, unspecified: Secondary | ICD-10-CM | POA: Insufficient documentation

## 2014-03-20 DIAGNOSIS — J029 Acute pharyngitis, unspecified: Secondary | ICD-10-CM | POA: Insufficient documentation

## 2014-03-20 DIAGNOSIS — E669 Obesity, unspecified: Secondary | ICD-10-CM | POA: Insufficient documentation

## 2014-03-20 DIAGNOSIS — J45909 Unspecified asthma, uncomplicated: Secondary | ICD-10-CM | POA: Insufficient documentation

## 2014-03-20 NOTE — ED Notes (Signed)
Pt made aware that we will have to draw blood r/t complaints of chest pain. Pt says that she will not have any blood drawn and she will not stay if that is the case. Explained to pt that in order to rule out any cardiac related issues that could be causing her chest pain, blood work is a protocol. Pt was very rude to the staff and said she would go somewhere where they would listen to her.

## 2014-03-20 NOTE — ED Notes (Signed)
Called pt for triage, no response 

## 2014-03-20 NOTE — ED Notes (Signed)
Pt presents with c/o chest pain that started last night, generalized in nature. Pt also c/o cough and sore throat. Pt reports that her fiance was diagnosed with bronchitis several days ago and she has the same symptoms. Pt says that her chest pain is constant.

## 2014-05-09 ENCOUNTER — Emergency Department (HOSPITAL_COMMUNITY): Payer: Self-pay

## 2014-05-09 ENCOUNTER — Encounter (HOSPITAL_COMMUNITY): Payer: Self-pay | Admitting: Emergency Medicine

## 2014-05-09 ENCOUNTER — Emergency Department (HOSPITAL_COMMUNITY)
Admission: EM | Admit: 2014-05-09 | Discharge: 2014-05-09 | Disposition: A | Discharge status: Home / Self Care | Payer: Self-pay | Attending: Emergency Medicine | Admitting: Emergency Medicine

## 2014-05-09 DIAGNOSIS — M25531 Pain in right wrist: Secondary | ICD-10-CM | POA: Insufficient documentation

## 2014-05-09 DIAGNOSIS — Z791 Long term (current) use of non-steroidal anti-inflammatories (NSAID): Secondary | ICD-10-CM | POA: Insufficient documentation

## 2014-05-09 DIAGNOSIS — Z8669 Personal history of other diseases of the nervous system and sense organs: Secondary | ICD-10-CM | POA: Insufficient documentation

## 2014-05-09 DIAGNOSIS — M25431 Effusion, right wrist: Secondary | ICD-10-CM | POA: Insufficient documentation

## 2014-05-09 DIAGNOSIS — Z7952 Long term (current) use of systemic steroids: Secondary | ICD-10-CM | POA: Insufficient documentation

## 2014-05-09 DIAGNOSIS — Z79899 Other long term (current) drug therapy: Secondary | ICD-10-CM | POA: Insufficient documentation

## 2014-05-09 DIAGNOSIS — J45909 Unspecified asthma, uncomplicated: Secondary | ICD-10-CM | POA: Insufficient documentation

## 2014-05-09 DIAGNOSIS — E669 Obesity, unspecified: Secondary | ICD-10-CM | POA: Insufficient documentation

## 2014-05-09 DIAGNOSIS — F419 Anxiety disorder, unspecified: Secondary | ICD-10-CM | POA: Insufficient documentation

## 2014-05-09 DIAGNOSIS — Z87828 Personal history of other (healed) physical injury and trauma: Secondary | ICD-10-CM | POA: Insufficient documentation

## 2014-05-09 MED ORDER — NAPROXEN 500 MG PO TABS: 500.0000 mg | ORAL_TABLET | Freq: Two times a day (BID) | ORAL | Status: DC

## 2014-05-09 MED ORDER — HYDROCODONE-ACETAMINOPHEN 5-325 MG PO TABS: 1.0000 | ORAL_TABLET | Freq: Four times a day (QID) | ORAL | Status: DC | PRN

## 2014-05-09 NOTE — ED Provider Notes (Signed)
CSN: [id number]     Arrival date & time 05/09/14  0905 History   First MD Initiated Contact with Patient 05/09/14 [phone]     Chief Complaint  Patient presents with  . Wrist Pain     (Consider location/radiation/quality/duration/timing/severity/associated sxs/prior Treatment) HPI Pt is a 28yo female c/o gradual onset right wrist pain and swelling started yesterday [place]. No falls or injuries. No hx of similar symptoms. Denies insect bites. Pt does state she had a heavy "over the shoulder" backpack on but denies carrying it in her right hand.  Pain in hand and wrist is constant, 10/10 at worst, worse with movement, aching and sore. Pt is right hand dominant. Pt did take tramadol last night, which she has from a recent knee injury, but denies relief of pain. Denies fever, n/v/d. Pt does report having pinched the palm of her hand with her grandfather's wheelchair a few days ago and having "gotten into something I'm allergic to" on the same hand about 1 week ago but states pain and swelling did not start until yesterday.  Denies numbness or tingling to hand.   Past Medical History  Diagnosis Date  . Asthma   . Obesity   . Seizures   . Obesity   . Anxiety    Past Surgical History  Procedure Laterality Date  . Tonsillectomy     Family History  Problem Relation Age of Onset  . Hypothyroidism Mother   . Diabetes Father   . Diabetes Paternal Uncle   . Hypertension Maternal Grandmother   . Hypertension Maternal Grandfather   . Hypertension Paternal Grandmother   . Diabetes Paternal Grandfather   . Heart disease Paternal Grandfather   . Hypertension Paternal Grandfather    History  Substance Use Topics  . Smoking status: Never Smoker   . Smokeless tobacco: Never Used  . Alcohol Use: No   OB History   Grav Para Term Preterm Abortions TAB SAB Ect Mult Living   1    1  1    0     Review of Systems  Musculoskeletal: Positive for  arthralgias, joint swelling and myalgias.       Right wrist pain and swelling  Skin: Negative for color change, rash and wound.  Neurological: Negative for weakness and numbness.  All other systems reviewed and are negative.     Allergies  Bee venom; Cinnamon; and Ibuprofen  Home Medications   Prior to Admission medications   Medication Sig Start Date End Date Taking? Authorizing Provider  albuterol (PROVENTIL HFA;VENTOLIN HFA) 108 (90 BASE) MCG/ACT inhaler Inhale 2 puffs into the lungs every 6 (six) hours as needed for shortness of breath. For shortness of breath.    Historical Provider, MD  benzonatate (TESSALON) 200 MG capsule Take 1 capsule (200 mg total) by mouth 3 (three) times daily as needed for cough. 01/26/14   03/29/14, MD  celecoxib (CELEBREX) 200 MG capsule Take 200 mg by mouth daily.     Historical Provider, MD  chlorpheniramine-HYDROcodone (TUSSIONEX) 10-8 MG/5ML LQCR Take 5 mLs by mouth at bedtime as needed for cough. 01/26/14   03/29/14, MD  cyclobenzaprine (FLEXERIL) 10 MG tablet Take 10 mg by mouth at bedtime.    Historical Provider, MD  guaiFENesin (MUCINEX) 600 MG 12 hr tablet Take 2 tablets (1,200 mg total) by mouth 2 (two) times daily. 01/26/14   03/29/14, MD  HYDROcodone-acetaminophen (NORCO/VICODIN) 5-325 MG per tablet  Take 1-2 tablets by mouth every 6 (six) hours as needed for moderate pain or severe pain. 05/09/14   Junius Finner, PA-C  methocarbamol (ROBAXIN) 750 MG tablet Take 750 mg by mouth 2 (two) times daily.    Historical Provider, MD  naproxen (NAPROSYN) 500 MG tablet Take 1 tablet (500 mg total) by mouth 2 (two) times daily. Be sure to eat a small snack or meal while taking to prevent nausea. 05/09/14   Junius Finner, PA-C  oxymetazoline (AFRIN NASAL SPRAY) 0.05 % nasal spray Place 1 spray into both nostrils 2 (two) times daily. For 3 days only 01/26/14   Olivia Mackie, MD  predniSONE (DELTASONE) 20 MG tablet Take 3 tablets (60 mg total) by mouth  daily. 01/26/14   Olivia Mackie, MD  traMADol (ULTRAM) 50 MG tablet Take 1 tablet (50 mg total) by mouth every 6 (six) hours as needed. 01/29/14   Geoffery Lyons, MD  traMADol (ULTRAM-ER) 100 MG 24 hr tablet Take 200 mg by mouth daily as needed for pain.     Historical Provider, MD   BP 115/58  Pulse 70  Temp(Src) 97.6 F (36.4 C) (Oral)  Resp 18  SpO2 100% Physical Exam  Nursing note and vitals reviewed. Constitutional: She is oriented to person, place, and time. She appears well-developed and well-nourished.  HENT:  Head: Normocephalic and atraumatic.  Eyes: EOM are normal.  Neck: Normal range of motion.  Cardiovascular: Normal rate.   Pulses:      Radial pulses are 2+ on the right side.  Right hand: Cap refill <3 seconds.  Pulmonary/Chest: Effort normal.  Musculoskeletal: Normal range of motion. She exhibits edema and tenderness.  Mild edema to right wrist and 2nd & 3rd MCP of right hand. Tender to palpation.  No ecchymosis or erythema. FROM right hand and wrist. 4/5 strength right hand vs left.  Neurological: She is alert and oriented to person, place, and time.  Sensation in tact right hand.  Skin: Skin is warm and dry. No erythema.  Pin-point superficial abrasions to radial aspect right wrist. 0.5cm superficial abrasion to palmar aspect right wrist. No erythema, warmth, ecchymosis. No induration or fluctuance. No evidence of underlying infection.   Psychiatric: She has a normal mood and affect. Her behavior is normal.    ED Course  Procedures (including critical care time) Labs Review Labs Reviewed - No data to display  Imaging Review Dg Wrist Complete Right  05/09/2014   CLINICAL DATA:  Right wrist pain without trauma for 1 day  EXAM: RIGHT WRIST - COMPLETE 3+ VIEW  COMPARISON:  03/30/2013  FINDINGS: There is no evidence of fracture or dislocation. There is no evidence of arthropathy or other focal bone abnormality. Soft tissues are unremarkable.  IMPRESSION: No acute  abnormality noted.   Electronically Signed   By: Alcide Clever M.D.   On: 05/09/2014 09:50     EKG Interpretation None      MDM   Final diagnoses:  Wrist pain, acute, right  Wrist swelling, right    Pt c/o right hand and wrist pain and swelling. No known injury. Hand is neurovascularly in tact. 4/5 grip strength vs left hand.  Plain films: no acute abnormality. No evidence of underlying infection. Will tx conservatively with wrist splint and pain medication. Encouraged RICE home tx. Advised to f/u with PCP in 3-4 days for recheck of symptoms if not improving.     Junius Finner, PA-C 05/09/14 1154

## 2014-05-09 NOTE — ED Notes (Signed)
Patient requested xray "to see what is wrong"

## 2014-05-09 NOTE — Progress Notes (Signed)
Orthopedic Tech Progress Note Patient Details:  Courtney Zamora 11-01-1985 [id number] Applied Velcro wrist splint to RUE.  Pulses, sensation, motion intact before and after splinting.  Capillary refill less than 2 seconds before and after splinting. Ortho Devices Type of Ortho Device: Wrist splint Ortho Device/Splint Location: RUE Ortho Device/Splint Interventions: Application   Lesle Chris 05/09/2014, 11:27 AM

## 2014-05-09 NOTE — Discharge Instructions (Signed)

## 2014-05-09 NOTE — ED Notes (Signed)
Per pt, [place] and right wrist starting hurting-no injury-increased pain, can move fingers, positive pulse-pain with palpation

## 2014-05-12 NOTE — ED Provider Notes (Signed)
Medical screening examination/treatment/procedure(s) were performed by non-physician practitioner and as supervising physician I was immediately available for consultation/collaboration.   EKG Interpretation None       Raeford Razor, MD 05/12/14 1303

## 2014-05-19 ENCOUNTER — Encounter (HOSPITAL_COMMUNITY): Payer: Self-pay | Admitting: Emergency Medicine

## 2014-06-19 ENCOUNTER — Emergency Department (HOSPITAL_BASED_OUTPATIENT_CLINIC_OR_DEPARTMENT_OTHER): Payer: Worker's Compensation

## 2014-06-19 ENCOUNTER — Encounter (HOSPITAL_BASED_OUTPATIENT_CLINIC_OR_DEPARTMENT_OTHER): Payer: Self-pay | Admitting: *Deleted

## 2014-06-19 ENCOUNTER — Emergency Department (HOSPITAL_BASED_OUTPATIENT_CLINIC_OR_DEPARTMENT_OTHER)
Admission: EM | Admit: 2014-06-19 | Discharge: 2014-06-20 | Disposition: A | Discharge status: Home / Self Care | Payer: Worker's Compensation | Attending: Emergency Medicine | Admitting: Emergency Medicine

## 2014-06-19 DIAGNOSIS — Y998 Other external cause status: Secondary | ICD-10-CM | POA: Diagnosis not present

## 2014-06-19 DIAGNOSIS — E669 Obesity, unspecified: Secondary | ICD-10-CM | POA: Insufficient documentation

## 2014-06-19 DIAGNOSIS — S8992XA Unspecified injury of left lower leg, initial encounter: Secondary | ICD-10-CM | POA: Diagnosis present

## 2014-06-19 DIAGNOSIS — S8002XA Contusion of left knee, initial encounter: Secondary | ICD-10-CM | POA: Insufficient documentation

## 2014-06-19 DIAGNOSIS — Z7952 Long term (current) use of systemic steroids: Secondary | ICD-10-CM | POA: Diagnosis not present

## 2014-06-19 DIAGNOSIS — Y9289 Other specified places as the place of occurrence of the external cause: Secondary | ICD-10-CM | POA: Diagnosis not present

## 2014-06-19 DIAGNOSIS — Z3202 Encounter for pregnancy test, result negative: Secondary | ICD-10-CM | POA: Diagnosis not present

## 2014-06-19 DIAGNOSIS — Z79899 Other long term (current) drug therapy: Secondary | ICD-10-CM | POA: Diagnosis not present

## 2014-06-19 DIAGNOSIS — J45909 Unspecified asthma, uncomplicated: Secondary | ICD-10-CM | POA: Insufficient documentation

## 2014-06-19 DIAGNOSIS — S99912A Unspecified injury of left ankle, initial encounter: Secondary | ICD-10-CM | POA: Diagnosis not present

## 2014-06-19 DIAGNOSIS — Y9389 Activity, other specified: Secondary | ICD-10-CM | POA: Insufficient documentation

## 2014-06-19 DIAGNOSIS — Z791 Long term (current) use of non-steroidal anti-inflammatories (NSAID): Secondary | ICD-10-CM | POA: Diagnosis not present

## 2014-06-19 DIAGNOSIS — F419 Anxiety disorder, unspecified: Secondary | ICD-10-CM | POA: Diagnosis not present

## 2014-06-19 DIAGNOSIS — X58XXXA Exposure to other specified factors, initial encounter: Secondary | ICD-10-CM | POA: Diagnosis not present

## 2014-06-19 DIAGNOSIS — R52 Pain, unspecified: Secondary | ICD-10-CM

## 2014-06-19 LAB — PREGNANCY, URINE: Preg Test, Ur: NEGATIVE

## 2014-06-19 MED ORDER — HYDROCODONE-ACETAMINOPHEN 5-325 MG PO TABS
ORAL_TABLET | ORAL | Status: AC
  Filled 2014-06-19: qty 2

## 2014-06-19 MED ORDER — HYDROCODONE-ACETAMINOPHEN 5-325 MG PO TABS: 1.0000 | ORAL_TABLET | Freq: Four times a day (QID) | ORAL | Status: DC | PRN

## 2014-06-19 MED ORDER — HYDROCODONE-ACETAMINOPHEN 5-325 MG PO TABS
2.0000 | ORAL_TABLET | Freq: Once | ORAL | Status: AC
  Administered 2014-06-19: 2 via ORAL

## 2014-06-19 NOTE — Discharge Instructions (Signed)
Call and make an appointment to follow up with your orthopedist. For continued symptoms, you may need an MRI. Keep leg elevated and use ice to help control swelling. Take pain meds as directed.    Knee Sprain A knee sprain is a tear in the strong bands of tissue that connect the bones (ligaments) of your knee. HOME CARE  Raise (elevate) your injured knee to lessen puffiness (swelling).  To ease pain and puffiness, put ice on the injured area.  Put ice in a plastic bag.  Place a towel between your skin and the bag.  Leave the ice on for 20 minutes, 2-3 times a day.  Only take medicine as told by your doctor.  Do not leave your knee unprotected until pain and stiffness go away (usually 4-6 weeks).  If you have a cast or splint, do not get it wet. If your doctor told you to not take it off, cover it with a plastic bag when you shower or bathe. Do not swim.  Your doctor may have you do exercises to prevent or limit permanent weakness and stiffness. GET HELP RIGHT AWAY IF:   Your cast or splint becomes damaged.  Your pain gets worse.  You have a lot of pain, puffiness, or numbness below the cast or splint. MAKE SURE YOU:   Understand these instructions.  Will watch your condition.  Will get help right away if you are not doing well or get worse. Document Released: 06/22/2009 Document Revised: 07/09/2013 Document Reviewed: 03/12/2013 [facility] Patient Information 2015 [place], [place]. This information is not intended to replace advice given to you by your health care provider. Make sure you discuss any questions you have with your health care provider.
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
## 2014-06-19 NOTE — ED Notes (Signed)
Pt reports it was raining two days ago and her knee buckled, she c/o pain in her left knee.

## 2014-09-10 ENCOUNTER — Emergency Department (HOSPITAL_COMMUNITY)
Admission: EM | Admit: 2014-09-10 | Discharge: 2014-09-10 | Disposition: A | Discharge status: Home / Self Care | Payer: Self-pay | Attending: Emergency Medicine | Admitting: Emergency Medicine

## 2014-09-10 ENCOUNTER — Encounter (HOSPITAL_COMMUNITY): Payer: Self-pay | Admitting: Emergency Medicine

## 2014-09-10 DIAGNOSIS — Z3202 Encounter for pregnancy test, result negative: Secondary | ICD-10-CM | POA: Insufficient documentation

## 2014-09-10 DIAGNOSIS — J45909 Unspecified asthma, uncomplicated: Secondary | ICD-10-CM | POA: Insufficient documentation

## 2014-09-10 DIAGNOSIS — F419 Anxiety disorder, unspecified: Secondary | ICD-10-CM | POA: Insufficient documentation

## 2014-09-10 DIAGNOSIS — Z8669 Personal history of other diseases of the nervous system and sense organs: Secondary | ICD-10-CM | POA: Insufficient documentation

## 2014-09-10 DIAGNOSIS — R112 Nausea with vomiting, unspecified: Secondary | ICD-10-CM | POA: Insufficient documentation

## 2014-09-10 DIAGNOSIS — Z79899 Other long term (current) drug therapy: Secondary | ICD-10-CM | POA: Insufficient documentation

## 2014-09-10 DIAGNOSIS — R109 Unspecified abdominal pain: Secondary | ICD-10-CM | POA: Insufficient documentation

## 2014-09-10 LAB — URINALYSIS, ROUTINE W REFLEX MICROSCOPIC
BILIRUBIN URINE: NEGATIVE
Glucose, UA: NEGATIVE mg/dL
KETONES UR: NEGATIVE mg/dL
Nitrite: NEGATIVE
PH: 7 (ref 5.0–8.0)
Protein, ur: NEGATIVE mg/dL
SPECIFIC GRAVITY, URINE: 1.027 (ref 1.005–1.030)
UROBILINOGEN UA: 1 mg/dL (ref 0.0–1.0)

## 2014-09-10 LAB — PREGNANCY, URINE: Preg Test, Ur: NEGATIVE

## 2014-09-10 LAB — URINE MICROSCOPIC-ADD ON

## 2014-09-10 MED ORDER — ONDANSETRON 4 MG PO TBDP
4.0000 mg | ORAL_TABLET | Freq: Once | ORAL | Status: AC
  Administered 2014-09-10: 4 mg via ORAL
  Filled 2014-09-10: qty 1

## 2014-09-10 MED ORDER — PROMETHAZINE HCL 25 MG PO TABS: 25.0000 mg | ORAL_TABLET | Freq: Four times a day (QID) | ORAL | Status: DC | PRN

## 2014-09-10 MED ORDER — ONDANSETRON 8 MG PO TBDP: 8.0000 mg | ORAL_TABLET | Freq: Once | ORAL | Status: DC

## 2014-09-10 NOTE — ED Notes (Signed)
PO trial initiated.

## 2014-09-10 NOTE — ED Notes (Signed)
Pt c/o abd cramping tat started this morning, started vomiting 1 hour ago and states that she is vomiting so much that it is almost causing her to have a seizure.

## 2014-09-10 NOTE — ED Provider Notes (Signed)
CSN: [id number]     Arrival date & time 09/10/14  1632 History   First MD Initiated Contact with Patient 09/10/14 1751     Chief Complaint  Patient presents with  . Abdominal Pain     (Consider location/radiation/quality/duration/timing/severity/associated sxs/prior Treatment) HPI Pt is a morbidly obese 28yo female with hx of asthma, seizures ("only when stuck with a needle"), and anxiety, presenting to ED with c/o diffuse abdominal cramping and vomiting that started around 4PM.  Pt states she is vomiting so much that it is almost causing her to have a seizure.  Pt states she is not on seizure medication.  States she had a seizure about this time last year in [place] when she had food poisoning.  States she did not have blood drawn or and IV placed, they "just monitored me and sent me home." pt refusing to have blood drawn or IV placed.  States her dad is on his way to ED and is the only one who can "talk her down" from having a seizure.  Denies fever reports chills and loss of appetite. Denies pain or nausea medication taken PTA. Denies sick contacts or recent travel.    Past Medical History  Diagnosis Date  . Asthma   . Obesity   . Seizures   . Obesity   . Anxiety    Past Surgical History  Procedure Laterality Date  . Tonsillectomy     Family History  Problem Relation Age of Onset  . Hypothyroidism Mother   . Diabetes Father   . Diabetes Paternal Uncle   . Hypertension Maternal Grandmother   . Hypertension Maternal Grandfather   . Hypertension Paternal Grandmother   . Diabetes Paternal Grandfather   . Heart disease Paternal Grandfather   . Hypertension Paternal Grandfather    History  Substance Use Topics  . Smoking status: Never Smoker   . Smokeless tobacco: Never Used  . Alcohol Use: No   OB History    Gravida Para Term Preterm AB TAB SAB Ectopic Multiple Living   1    1  1    0     Review of Systems  Constitutional: Positive for chills and appetite change.  Negative for fever, diaphoresis and fatigue.  Respiratory: Negative for cough and shortness of breath.   Cardiovascular: Negative for chest pain and palpitations.  Gastrointestinal: Positive for nausea and vomiting. Negative for abdominal pain, diarrhea and constipation.  Genitourinary: Negative for dysuria, frequency, hematuria and flank pain.  All other systems reviewed and are negative.     Allergies  Bee venom; Cinnamon; and Ibuprofen  Home Medications   Prior to Admission medications   Medication Sig Start Date End Date Taking? Authorizing Provider  albuterol (PROVENTIL HFA;VENTOLIN HFA) 108 (90 BASE) MCG/ACT inhaler Inhale 2 puffs into the lungs every 6 (six) hours as needed for wheezing or shortness of breath (wheezing). For shortness of breath.   Yes Historical Provider, MD  celecoxib (CELEBREX) 200 MG capsule Take 200 mg by mouth daily as needed for mild pain or moderate pain (pain).    Yes Historical Provider, MD  cyclobenzaprine (FLEXERIL) 10 MG tablet Take 10 mg by mouth 3 (three) times daily as needed for muscle spasms (muscle spasms).    Yes Historical Provider, MD  methocarbamol (ROBAXIN) 750 MG tablet Take 750 mg by mouth every 6 (six) hours as needed for muscle spasms (muscle spasms).    Yes Historical Provider, MD  traMADol (ULTRAM-ER) 100 MG 24 hr tablet Take 200 mg  by mouth daily as needed for pain (pain).    Yes Historical Provider, MD  benzonatate (TESSALON) 200 MG capsule Take 1 capsule (200 mg total) by mouth 3 (three) times daily as needed for cough. Patient not taking: Reported on 09/10/2014 01/26/14   Olivia Mackie, MD  chlorpheniramine-HYDROcodone (TUSSIONEX) 10-8 MG/5ML [facility] Take 5 mLs by mouth at bedtime as needed for cough. Patient not taking: Reported on 09/10/2014 01/26/14   Olivia Mackie, MD  guaiFENesin (MUCINEX) 600 MG 12 hr tablet Take 2 tablets (1,200 mg total) by mouth 2 (two) times daily. Patient not taking: Reported on 09/10/2014 01/26/14   Olivia Mackie, MD   HYDROcodone-acetaminophen (NORCO/VICODIN) 5-325 MG per tablet Take 1-2 tablets by mouth every 6 (six) hours as needed for moderate pain or severe pain. Patient not taking: Reported on 09/10/2014 06/19/14   Loren Racer, MD  naproxen (NAPROSYN) 500 MG tablet Take 1 tablet (500 mg total) by mouth 2 (two) times daily. Be sure to eat a small snack or meal while taking to prevent nausea. Patient not taking: Reported on 09/10/2014 05/09/14   Junius Finner, PA-C  oxymetazoline (AFRIN NASAL SPRAY) 0.05 % nasal spray Place 1 spray into both nostrils 2 (two) times daily. For 3 days only Patient not taking: Reported on 09/10/2014 01/26/14   Olivia Mackie, MD  predniSONE (DELTASONE) 20 MG tablet Take 3 tablets (60 mg total) by mouth daily. Patient not taking: Reported on 09/10/2014 01/26/14   Olivia Mackie, MD  traMADol (ULTRAM) 50 MG tablet Take 1 tablet (50 mg total) by mouth every 6 (six) hours as needed. Patient not taking: Reported on 09/10/2014 01/29/14   Geoffery Lyons, MD   BP 132/60 mmHg  Pulse 103  Temp(Src) 98.5 F (36.9 C) (Oral)  Resp 16  SpO2 97% Physical Exam  Constitutional: She appears well-developed and well-nourished. She appears distressed.  Morbidly obese female lying in exam bed holding plastic bucket, vomiting stomach content. No blood in emesis.   HENT:  Head: Normocephalic and atraumatic.  Right Ear: Hearing, tympanic membrane, external ear and ear canal normal.  Left Ear: Hearing, tympanic membrane, external ear and ear canal normal.  Nose: Nose normal.  Eyes: Conjunctivae are normal. No scleral icterus.  Neck: Normal range of motion.  Cardiovascular: Normal rate, regular rhythm and normal heart sounds.   Pulmonary/Chest: Effort normal and breath sounds normal. No respiratory distress. She has no wheezes. She has no rales. She exhibits no tenderness.  Abdominal: Soft. Bowel sounds are normal. She exhibits no distension and no mass. There is no tenderness. There is no rebound and no  guarding.  Obese abdomen, exam limited by body habitus. Soft, non-tender.  Musculoskeletal: Normal range of motion.  Neurological: She is alert.  Skin: Skin is warm and dry. She is not diaphoretic.  Nursing note and vitals reviewed.   ED Course  Procedures (including critical care time) Labs Review Labs Reviewed  URINALYSIS, ROUTINE W REFLEX MICROSCOPIC - Abnormal; Notable for the following:    APPearance CLOUDY (*)    Hgb urine dipstick SMALL (*)    Leukocytes, UA MODERATE (*)    All other components within normal limits  URINE MICROSCOPIC-ADD ON - Abnormal; Notable for the following:    Squamous Epithelial / LPF FEW (*)    Bacteria, UA FEW (*)    All other components within normal limits  URINE CULTURE  PREGNANCY, URINE  POC URINE PREG, ED    Imaging Review No results found.  EKG Interpretation None      MDM   Final diagnoses:  Abdominal cramping  Non-intractable vomiting with nausea, vomiting of unspecified type    Pt is a 28yo morbidly obese female presenting to ED with c/o nausea and vomiting since 4PM.  Pt is anxious and concerned about having a needle placed for blood work and IV placement as she reports seizures with loss of continence in the past.  Has refused several times to have blood work drawn. Urine preg and UA performed, although pt denies urinary or vaginal complaints, choices limited w/o blood work.    Urine preg: negative UA:  Moderate leukocytes with 7-10 WBC, negative for nitrites.  As pt is not c/o urinary symptoms will hold off on tx with antibiotics at this time, will send urine culture.    7:31 PM Pt sleeping.  Reviewed labs, pt willing to try ODT zofran and fluid challenge. Discussed again with the risks of missing emergent/infectious cause of vomiting. Pt verbalized understanding and has agreed to get labs drawn if unable to keep down PO fluids after zofran given.   8:29 PM Pt able to keep down PO fluids. No seizures while in ED.  Still  declines IV fluids or blood work to be taken.  States she will "tough it out at home" with nausea medication. Advised pt to f/u with PCP, resource guide provided, as well as strict return precautions to ED given. Pt verbalized understanding and agreement with tx plan.    Junius Finner, PA-C 09/10/14 2038  [name], MD 09/12/14 [phone]

## 2014-09-10 NOTE — ED Notes (Signed)
PT REFUSING BLOOD DRAW

## 2014-09-10 NOTE — ED Notes (Signed)
Phlebotomy went to draw pt's blood, pt stated "I am about to have a seizure so no you cannot draw my blood;"

## 2014-09-10 NOTE — ED Notes (Signed)
Pt refusing blood work or IV at this time. She states if she gets stuck by a needle she will pass out and have a seizure.

## 2014-09-11 LAB — URINE CULTURE: Colony Count: >=100000

## 2014-10-29 ENCOUNTER — Emergency Department (HOSPITAL_BASED_OUTPATIENT_CLINIC_OR_DEPARTMENT_OTHER)
Admission: EM | Admit: 2014-10-29 | Discharge: 2014-10-29 | Disposition: A | Discharge status: Home / Self Care | Payer: Medicaid Other | Attending: Emergency Medicine | Admitting: Emergency Medicine

## 2014-10-29 ENCOUNTER — Encounter (HOSPITAL_BASED_OUTPATIENT_CLINIC_OR_DEPARTMENT_OTHER): Payer: Self-pay | Admitting: *Deleted

## 2014-10-29 ENCOUNTER — Emergency Department (HOSPITAL_BASED_OUTPATIENT_CLINIC_OR_DEPARTMENT_OTHER): Payer: Medicaid Other

## 2014-10-29 DIAGNOSIS — F419 Anxiety disorder, unspecified: Secondary | ICD-10-CM | POA: Insufficient documentation

## 2014-10-29 DIAGNOSIS — Y998 Other external cause status: Secondary | ICD-10-CM | POA: Insufficient documentation

## 2014-10-29 DIAGNOSIS — E669 Obesity, unspecified: Secondary | ICD-10-CM | POA: Insufficient documentation

## 2014-10-29 DIAGNOSIS — J45909 Unspecified asthma, uncomplicated: Secondary | ICD-10-CM | POA: Insufficient documentation

## 2014-10-29 DIAGNOSIS — Y9389 Activity, other specified: Secondary | ICD-10-CM | POA: Insufficient documentation

## 2014-10-29 DIAGNOSIS — Z79899 Other long term (current) drug therapy: Secondary | ICD-10-CM | POA: Insufficient documentation

## 2014-10-29 DIAGNOSIS — M25561 Pain in right knee: Secondary | ICD-10-CM

## 2014-10-29 DIAGNOSIS — Z791 Long term (current) use of non-steroidal anti-inflammatories (NSAID): Secondary | ICD-10-CM | POA: Insufficient documentation

## 2014-10-29 DIAGNOSIS — Y9289 Other specified places as the place of occurrence of the external cause: Secondary | ICD-10-CM | POA: Insufficient documentation

## 2014-10-29 DIAGNOSIS — S8991XA Unspecified injury of right lower leg, initial encounter: Secondary | ICD-10-CM | POA: Insufficient documentation

## 2014-10-29 DIAGNOSIS — X58XXXA Exposure to other specified factors, initial encounter: Secondary | ICD-10-CM | POA: Insufficient documentation

## 2014-10-29 MED ORDER — HYDROCODONE-ACETAMINOPHEN 5-325 MG PO TABS: 2.0000 | ORAL_TABLET | ORAL | Status: DC | PRN

## 2014-10-29 MED ORDER — HYDROCODONE-ACETAMINOPHEN 5-325 MG PO TABS
2.0000 | ORAL_TABLET | Freq: Once | ORAL | Status: AC
  Administered 2014-10-29: 2 via ORAL
  Filled 2014-10-29: qty 2

## 2014-10-29 NOTE — ED Notes (Signed)
Pt c/o right knee pain x 6 hrs

## 2014-10-29 NOTE — Discharge Instructions (Signed)

## 2014-10-29 NOTE — ED Provider Notes (Signed)
CSN: [id number]     Arrival date & time 10/29/14  4315 History  This chart was scribed for Toy Cookey, MD by Chestine Spore, ED Scribe. The patient was seen in room MH11/MH11 at 8:01 PM.     Chief Complaint  Patient presents with  . Knee Pain      The history is provided by the patient. No language interpreter was used.    HPI Comments: DEVONE BONILLA is a 29 y.o. female with a medical hx of obesity who presents to the Emergency Department complaining of right knee pain onset 6 hours. Pt was watching her sisters kids before the knee pain came. Pt was laying on the bed when she rolled over to her left side to get off the bed when her knee twisted and popped. Pt didn't fall off the bed when the incident occurred. Pt reports that her knee pain has gotten progressively worse. Pt broke her right knee cap and her right ankle. Pt denies any issues with her left knee. Pt notes that the pain radiates up and down her leg. Pt reports that she is tender over her knee cap. Pt can wiggle her toes. She states that she is having associated symptoms of mild right hip pain. She denies any other symptoms. Pt is not all   Past Medical History  Diagnosis Date  . Asthma   . Obesity   . Seizures   . Obesity   . Anxiety    Past Surgical History  Procedure Laterality Date  . Tonsillectomy     Family History  Problem Relation Age of Onset  . Hypothyroidism Mother   . Diabetes Father   . Diabetes Paternal Uncle   . Hypertension Maternal Grandmother   . Hypertension Maternal Grandfather   . Hypertension Paternal Grandmother   . Diabetes Paternal Grandfather   . Heart disease Paternal Grandfather   . Hypertension Paternal Grandfather    History  Substance Use Topics  . Smoking status: Never Smoker   . Smokeless tobacco: Never Used  . Alcohol Use: No   OB History    Gravida Para Term Preterm AB TAB SAB Ectopic Multiple Living   1    1  1    0     Review of Systems  Musculoskeletal: Positive  for arthralgias (right knee and right hip pain). Negative for joint swelling.    A complete 10 system review of systems was obtained and all systems are negative except as noted in the HPI and PMH.    Allergies  Bee venom; Cinnamon; and Ibuprofen  Home Medications   Prior to Admission medications   Medication Sig Start Date End Date Taking? Authorizing Provider  albuterol (PROVENTIL HFA;VENTOLIN HFA) 108 (90 BASE) MCG/ACT inhaler Inhale 2 puffs into the lungs every 6 (six) hours as needed for wheezing or shortness of breath (wheezing). For shortness of breath.    Historical Provider, MD  benzonatate (TESSALON) 200 MG capsule Take 1 capsule (200 mg total) by mouth 3 (three) times daily as needed for cough. Patient not taking: Reported on 09/10/2014 01/26/14   03/29/14, MD  celecoxib (CELEBREX) 200 MG capsule Take 200 mg by mouth daily as needed for mild pain or moderate pain (pain).     Historical Provider, MD  chlorpheniramine-HYDROcodone (TUSSIONEX) 10-8 MG/5ML LQCR Take 5 mLs by mouth at bedtime as needed for cough. Patient not taking: Reported on 09/10/2014 01/26/14   03/29/14, MD  cyclobenzaprine (FLEXERIL) 10 MG tablet Take 10 mg  by mouth 3 (three) times daily as needed for muscle spasms (muscle spasms).     Historical Provider, MD  guaiFENesin (MUCINEX) 600 MG 12 hr tablet Take 2 tablets (1,200 mg total) by mouth 2 (two) times daily. Patient not taking: Reported on 09/10/2014 01/26/14   Marisa Severin, MD  HYDROcodone-acetaminophen [facility]) 5-325 MG per tablet Take 2 tablets by mouth every 4 (four) hours as needed. 10/29/14   Toy Cookey, MD  methocarbamol (ROBAXIN) 750 MG tablet Take 750 mg by mouth every 6 (six) hours as needed for muscle spasms (muscle spasms).     Historical Provider, MD  naproxen (NAPROSYN) 500 MG tablet Take 1 tablet (500 mg total) by mouth 2 (two) times daily. Be sure to eat a small snack or meal while taking to prevent nausea. Patient not taking: Reported on  09/10/2014 05/09/14   Junius Finner, PA-C  oxymetazoline (AFRIN NASAL SPRAY) 0.05 % nasal spray Place 1 spray into both nostrils 2 (two) times daily. For 3 days only Patient not taking: Reported on 09/10/2014 01/26/14   Marisa Severin, MD  predniSONE (DELTASONE) 20 MG tablet Take 3 tablets (60 mg total) by mouth daily. Patient not taking: Reported on 09/10/2014 01/26/14   Marisa Severin, MD  promethazine (PHENERGAN) 25 MG tablet Take 1 tablet (25 mg total) by mouth every 6 (six) hours as needed for nausea or vomiting. 09/10/14   Junius Finner, PA-C  traMADol (ULTRAM) 50 MG tablet Take 1 tablet (50 mg total) by mouth every 6 (six) hours as needed. Patient not taking: Reported on 09/10/2014 01/29/14   Geoffery Lyons, MD  traMADol (ULTRAM-ER) 100 MG 24 hr tablet Take 200 mg by mouth daily as needed for pain (pain).     Historical Provider, MD   BP 142/91 mmHg  Pulse 104  Temp(Src) 98.3 F (36.8 C) (Oral)  Resp 18  Wt 342 lb (155.13 kg)  SpO2 100%  Physical Exam  Constitutional: She is oriented to person, place, and time. She appears well-developed and well-nourished. No distress.  HENT:  Head: Normocephalic.  Mouth/Throat: Oropharynx is clear and moist.  Eyes: Pupils are equal, round, and reactive to light.  Neck: Neck supple.  Cardiovascular: Normal rate, regular rhythm and normal heart sounds.   Pulmonary/Chest: Effort normal and breath sounds normal. No respiratory distress. She has no wheezes.  Abdominal: Soft. She exhibits no distension. There is no tenderness. There is no rebound and no guarding.  Musculoskeletal: She exhibits no edema.       Right knee: She exhibits bony tenderness. She exhibits normal alignment, no LCL laxity, normal patellar mobility, normal meniscus and no MCL laxity. Tenderness found.  Neurological: She is alert and oriented to person, place, and time.  Skin: Skin is warm and dry.  Psychiatric: She has a normal mood and affect.  Nursing note and vitals reviewed.   ED Course   Procedures (including critical care time) DIAGNOSTIC STUDIES: Oxygen Saturation is 100% on RA, nl by my interpretation.    COORDINATION OF CARE: 8:09 PM-Discussed treatment plan which includes right knee x-ray, knee immobilizer, referral to orthopedist, and crutches with pt at bedside and pt agreed to plan.   Labs Review Labs Reviewed - No data to display  Imaging Review Dg Knee Complete 4 Views Right  10/29/2014   CLINICAL DATA:  Fall tonight now with right knee pain and swelling.  EXAM: RIGHT KNEE - COMPLETE 4+ VIEW  COMPARISON:  None.  FINDINGS: No fracture or dislocation. The alignment and joint spaces  are maintained. Small medial and lateral tibial femoral peripheral spurs. No joint effusion or focal soft tissue abnormality.  IMPRESSION: No fracture or dislocation of the right knee   Electronically Signed   By: Rubye Oaks M.D.   On: 10/29/2014 20:17     EKG Interpretation None      MDM   Final diagnoses:  Right anterior knee pain    Pt is a 29 y.o. female with Pmhx as above who presents with sudden onset anterior knee pain with a pop after rolling over on couch upon waking up from a nap.  On physical exam, vital signs are stable.  Patient is in no acute distress.  No appreciable knee effusion, the legs are obese, making exam difficult.  She is neurovascularly and intact distally, he appears stable.  X-ray negative.  Patient we placed in the immobilizer and given crutches.  She's been asked to follow-up with Dr. Pearletha Forge sports medicine in one week if still having pain.     Rashan L [place]goats evaluation in the Emergency Department is complete. It has been determined that no acute conditions requiring further emergency intervention are present at this time. The patient/guardian have been advised of the diagnosis and plan. We have discussed signs and symptoms that warrant return to the ED, such as changes or worsening in symptoms, worsening pain, swelling, numbness,  weakness    I personally performed the services described in this documentation, which was scribed in my presence. The recorded information has been reviewed and is accurate.    Toy Cookey, MD 10/29/14 2034

## 2015-01-14 ENCOUNTER — Emergency Department (HOSPITAL_BASED_OUTPATIENT_CLINIC_OR_DEPARTMENT_OTHER): Payer: Worker's Compensation

## 2015-01-14 ENCOUNTER — Emergency Department (HOSPITAL_BASED_OUTPATIENT_CLINIC_OR_DEPARTMENT_OTHER)
Admission: EM | Admit: 2015-01-14 | Discharge: 2015-01-14 | Disposition: A | Discharge status: Home / Self Care | Payer: Worker's Compensation | Attending: Emergency Medicine | Admitting: Emergency Medicine

## 2015-01-14 ENCOUNTER — Encounter (HOSPITAL_BASED_OUTPATIENT_CLINIC_OR_DEPARTMENT_OTHER): Payer: Self-pay

## 2015-01-14 DIAGNOSIS — Y9389 Activity, other specified: Secondary | ICD-10-CM | POA: Insufficient documentation

## 2015-01-14 DIAGNOSIS — S99912A Unspecified injury of left ankle, initial encounter: Secondary | ICD-10-CM | POA: Diagnosis present

## 2015-01-14 DIAGNOSIS — Z791 Long term (current) use of non-steroidal anti-inflammatories (NSAID): Secondary | ICD-10-CM | POA: Diagnosis not present

## 2015-01-14 DIAGNOSIS — J45909 Unspecified asthma, uncomplicated: Secondary | ICD-10-CM | POA: Diagnosis not present

## 2015-01-14 DIAGNOSIS — S93402A Sprain of unspecified ligament of left ankle, initial encounter: Secondary | ICD-10-CM | POA: Insufficient documentation

## 2015-01-14 DIAGNOSIS — Y9289 Other specified places as the place of occurrence of the external cause: Secondary | ICD-10-CM | POA: Insufficient documentation

## 2015-01-14 DIAGNOSIS — F419 Anxiety disorder, unspecified: Secondary | ICD-10-CM | POA: Diagnosis not present

## 2015-01-14 DIAGNOSIS — E669 Obesity, unspecified: Secondary | ICD-10-CM | POA: Insufficient documentation

## 2015-01-14 DIAGNOSIS — W1842XA Slipping, tripping and stumbling without falling due to stepping into hole or opening, initial encounter: Secondary | ICD-10-CM | POA: Insufficient documentation

## 2015-01-14 DIAGNOSIS — Y998 Other external cause status: Secondary | ICD-10-CM | POA: Diagnosis not present

## 2015-01-14 DIAGNOSIS — Z79899 Other long term (current) drug therapy: Secondary | ICD-10-CM | POA: Diagnosis not present

## 2015-01-14 MED ORDER — TRAMADOL HCL 50 MG PO TABS: 50.0000 mg | ORAL_TABLET | Freq: Four times a day (QID) | ORAL | Status: DC | PRN

## 2015-01-14 NOTE — Discharge Instructions (Signed)

## 2015-01-14 NOTE — ED Provider Notes (Signed)
CSN: [id number]     Arrival date & time 01/14/15  1630 History   First MD Initiated Contact with Patient 01/14/15 1640     Chief Complaint  Patient presents with  . Ankle Injury     (Consider location/radiation/quality/duration/timing/severity/associated sxs/prior Treatment) Patient is a 29 y.o. female presenting with lower extremity injury. The history is provided by the patient. No language interpreter was used.  Ankle Injury This is a new problem. The current episode started yesterday. The problem occurs constantly. The problem has been unchanged. Associated symptoms include joint swelling. Pertinent negatives include no numbness or weakness. The symptoms are aggravated by walking. She has tried nothing for the symptoms.    Past Medical History  Diagnosis Date  . Asthma   . Obesity   . Seizures   . Obesity   . Anxiety    Past Surgical History  Procedure Laterality Date  . Tonsillectomy     Family History  Problem Relation Age of Onset  . Hypothyroidism Mother   . Diabetes Father   . Diabetes Paternal Uncle   . Hypertension Maternal Grandmother   . Hypertension Maternal Grandfather   . Hypertension Paternal Grandmother   . Diabetes Paternal Grandfather   . Heart disease Paternal Grandfather   . Hypertension Paternal Grandfather    History  Substance Use Topics  . Smoking status: Never Smoker   . Smokeless tobacco: Never Used  . Alcohol Use: No   OB History    Gravida Para Term Preterm AB TAB SAB Ectopic Multiple Living   1    1  1    0     Review of Systems  Musculoskeletal: Positive for joint swelling.  Neurological: Negative for weakness and numbness.  All other systems reviewed and are negative.     Allergies  Bee venom; Cinnamon; and Ibuprofen  Home Medications   Prior to Admission medications   Medication Sig Start Date End Date Taking? Authorizing Provider  Bioflavonoid Products (BIOFLEX PO) Take by mouth.   Yes Historical Provider, MD   Meloxicam (MOBIC PO) Take by mouth.   Yes Historical Provider, MD  tiZANidine (ZANAFLEX) 4 MG capsule Take 4 mg by mouth 3 (three) times daily.   Yes Historical Provider, MD  albuterol (PROVENTIL HFA;VENTOLIN HFA) 108 (90 BASE) MCG/ACT inhaler Inhale 2 puffs into the lungs every 6 (six) hours as needed for wheezing or shortness of breath (wheezing). For shortness of breath.    Historical Provider, MD  cyclobenzaprine (FLEXERIL) 10 MG tablet Take 10 mg by mouth 3 (three) times daily as needed for muscle spasms (muscle spasms).     Historical Provider, MD  methocarbamol (ROBAXIN) 750 MG tablet Take 750 mg by mouth every 6 (six) hours as needed for muscle spasms (muscle spasms).     Historical Provider, MD  traMADol (ULTRAM-ER) 100 MG 24 hr tablet Take 200 mg by mouth daily as needed for pain (pain).     Historical Provider, MD   BP 129/64 mmHg  Pulse 106  Temp(Src) 98.7 F (37.1 C) (Oral)  Resp 18  Ht 5\' 4"  (1.626 m)  Wt 350 lb (158.759 kg)  BMI 60.05 kg/m2  SpO2 100%  LMP 01/03/2015 Physical Exam  Constitutional: She is oriented to person, place, and time. She appears well-developed and well-nourished.  Cardiovascular: Normal rate and regular rhythm.   Pulmonary/Chest: Effort normal and breath sounds normal.  Musculoskeletal:  Generalized tenderness to the left ankle. No gross deformity or swelling  Neurological: She is alert and  oriented to person, place, and time. Coordination normal.  Skin: Skin is warm and dry.  Nursing note and vitals reviewed.   ED Course  Procedures (including critical care time) Labs Review Labs Reviewed - No data to display  Imaging Review Dg Ankle Complete Left  01/14/2015   CLINICAL DATA:  Pain following rolling type injury earlier today  EXAM: LEFT ANKLE COMPLETE - 3+ VIEW  COMPARISON:  None.  FINDINGS: Frontal, oblique, and lateral views were obtained. There is mild generalized soft tissue swelling. There is no demonstrable fracture or effusion. The  ankle mortise appears intact. There is a small spur arising from the posterior calcaneus. There is no appreciable joint space narrowing.  IMPRESSION: Mild soft tissue swelling. Small posterior calcaneal spur. No demonstrable fracture. Ankle mortise appears intact.   Electronically Signed   By: Bretta Bang III M.D.   On: 01/14/2015 17:01     EKG Interpretation None      MDM   Final diagnoses:  Ankle sprain, left, initial encounter    No acute bony abnormality noted. Pt given aso and crutches and follow up with her ortho    Teressa Lower, NP 01/14/15 1714  Blake Divine, MD 01/14/15 [phone]

## 2015-01-14 NOTE — ED Notes (Signed)
Stepped in hole yesterday-left ankle injury

## 2015-03-02 ENCOUNTER — Emergency Department (HOSPITAL_COMMUNITY)
Admission: EM | Admit: 2015-03-02 | Discharge: 2015-03-02 | Disposition: A | Discharge status: Home / Self Care | Payer: Medicaid Other | Attending: Emergency Medicine | Admitting: Emergency Medicine

## 2015-03-02 ENCOUNTER — Encounter (HOSPITAL_COMMUNITY): Payer: Self-pay

## 2015-03-02 DIAGNOSIS — E669 Obesity, unspecified: Secondary | ICD-10-CM | POA: Insufficient documentation

## 2015-03-02 DIAGNOSIS — J4521 Mild intermittent asthma with (acute) exacerbation: Secondary | ICD-10-CM

## 2015-03-02 DIAGNOSIS — F419 Anxiety disorder, unspecified: Secondary | ICD-10-CM | POA: Insufficient documentation

## 2015-03-02 DIAGNOSIS — Z79899 Other long term (current) drug therapy: Secondary | ICD-10-CM | POA: Insufficient documentation

## 2015-03-02 MED ORDER — PREDNISONE 20 MG PO TABS: 60.0000 mg | ORAL_TABLET | Freq: Every day | ORAL | Status: DC

## 2015-03-02 MED ORDER — IPRATROPIUM-ALBUTEROL 0.5-2.5 (3) MG/3ML IN SOLN
3.0000 mL | Freq: Once | RESPIRATORY_TRACT | Status: AC
  Administered 2015-03-02: 3 mL via RESPIRATORY_TRACT
  Filled 2015-03-02: qty 3

## 2015-03-02 MED ORDER — PREDNISONE 20 MG PO TABS
60.0000 mg | ORAL_TABLET | Freq: Once | ORAL | Status: AC
  Administered 2015-03-02: 60 mg via ORAL
  Filled 2015-03-02: qty 3

## 2015-03-02 MED ORDER — ALBUTEROL SULFATE HFA 108 (90 BASE) MCG/ACT IN AERS: 2.0000 | INHALATION_SPRAY | Freq: Four times a day (QID) | RESPIRATORY_TRACT | Status: DC | PRN

## 2015-03-02 MED ORDER — ALBUTEROL SULFATE (2.5 MG/3ML) 0.083% IN NEBU: 5.0000 mg | INHALATION_SOLUTION | Freq: Once | RESPIRATORY_TRACT | Status: DC

## 2015-03-02 NOTE — ED Provider Notes (Signed)
CSN: [id number]     Arrival date & time 03/02/15  6484 History   First MD Initiated Contact with Patient 03/02/15 1014     No chief complaint on file.    (Consider location/radiation/quality/duration/timing/severity/associated sxs/prior Treatment) HPI Comments: Patient presents to the ER for evaluation of difficulty breathing. Patient reports that she does have a history of asthma. She started having difficulty breathing around 7:30 AM. She has used her inhaler without any improvement. She does not have a fever. There is no chest pain.   Past Medical History  Diagnosis Date  . Asthma   . Obesity   . Seizures   . Obesity   . Anxiety    Past Surgical History  Procedure Laterality Date  . Tonsillectomy     Family History  Problem Relation Age of Onset  . Hypothyroidism Mother   . Diabetes Father   . Diabetes Paternal Uncle   . Hypertension Maternal Grandmother   . Hypertension Maternal Grandfather   . Hypertension Paternal Grandmother   . Diabetes Paternal Grandfather   . Heart disease Paternal Grandfather   . Hypertension Paternal Grandfather    Social History  Substance Use Topics  . Smoking status: Never Smoker   . Smokeless tobacco: Never Used  . Alcohol Use: No   OB History    Gravida Para Term Preterm AB TAB SAB Ectopic Multiple Living   1    1  1    0     Review of Systems  Respiratory: Positive for shortness of breath and wheezing.   All other systems reviewed and are negative.     Allergies  Bee venom; Cinnamon; and Ibuprofen  Home Medications   Prior to Admission medications   Medication Sig Start Date End Date Taking? Authorizing Provider  traMADol (ULTRAM) 50 MG tablet Take 1 tablet (50 mg total) by mouth every 6 (six) hours as needed. 01/14/15  Yes 01/16/15, NP  albuterol (PROVENTIL HFA;VENTOLIN HFA) 108 (90 BASE) MCG/ACT inhaler Inhale 2 puffs into the lungs every 6 (six) hours as needed for wheezing or shortness of breath (wheezing). For  shortness of breath.    Historical Provider, MD  Bioflavonoid Products (BIOFLEX PO) Take by mouth.    Historical Provider, MD  cyclobenzaprine (FLEXERIL) 10 MG tablet Take 10 mg by mouth 3 (three) times daily as needed for muscle spasms (muscle spasms).     Historical Provider, MD  Meloxicam (MOBIC PO) Take by mouth.    Historical Provider, MD  methocarbamol (ROBAXIN) 750 MG tablet Take 750 mg by mouth every 6 (six) hours as needed for muscle spasms (muscle spasms).     Historical Provider, MD  tiZANidine (ZANAFLEX) 4 MG capsule Take 4 mg by mouth 3 (three) times daily.    Historical Provider, MD  traMADol (ULTRAM-ER) 100 MG 24 hr tablet Take 200 mg by mouth daily as needed for pain (pain).     Historical Provider, MD   BP 142/85 mmHg  Pulse 85  Temp(Src) 97.7 F (36.5 C) (Oral)  Resp 20  SpO2 100%  LMP 01/30/2015 Physical Exam  Constitutional: She is oriented to person, place, and time. She appears well-developed and well-nourished. No distress.  HENT:  Head: Normocephalic and atraumatic.  Right Ear: Hearing normal.  Left Ear: Hearing normal.  Nose: Nose normal.  Mouth/Throat: Oropharynx is clear and moist and mucous membranes are normal.  Eyes: Conjunctivae and EOM are normal. Pupils are equal, round, and reactive to light.  Neck: Normal range of motion.  Neck supple.  Cardiovascular: Regular rhythm, S1 normal and S2 normal.  Exam reveals no gallop and no friction rub.   No murmur heard. Pulmonary/Chest: Effort normal. No respiratory distress. She has decreased breath sounds. She exhibits no tenderness.  Abdominal: Soft. Normal appearance and bowel sounds are normal. There is no hepatosplenomegaly. There is no tenderness. There is no rebound, no guarding, no tenderness at McBurney's point and negative Murphy's sign. No hernia.  Musculoskeletal: Normal range of motion.  Neurological: She is alert and oriented to person, place, and time. She has normal strength. No cranial nerve deficit  or sensory deficit. Coordination normal. GCS eye subscore is 4. GCS verbal subscore is 5. GCS motor subscore is 6.  Skin: Skin is warm, dry and intact. No rash noted. No cyanosis.  Psychiatric: She has a normal mood and affect. Her speech is normal and behavior is normal. Thought content normal.  Nursing note and vitals reviewed.   ED Course  Procedures (including critical care time) Labs Review Labs Reviewed - No data to display  Imaging Review No results found. I, POLLINA, CHRISTOPHER J., personally reviewed and evaluated these images and lab results as part of my medical decision-making.   EKG Interpretation None      MDM   Final diagnoses:  None   asthma  Presents to the emergency primary for evaluation of difficulty breathing. Patient reports previous history of asthma, but only has infrequent problems with her asthma. She tried to use an old inhaler earlier today but it did not help. Patient had decreased breath sounds without significant wheezing at arrival. She symptomatically improved with DuoNeb. Patient treated with prednisone, will continue albuterol and prednisone as an outpatient. Return if symptoms worsen.    Gilda Crease, MD 03/02/15 1225

## 2015-03-02 NOTE — ED Notes (Signed)
Pt with asthma.  Having asthma attack.  Difficulty breathing since this morning.  Inhaler not working.

## 2015-03-02 NOTE — Discharge Instructions (Signed)

## 2015-04-24 ENCOUNTER — Encounter (HOSPITAL_COMMUNITY): Payer: Self-pay | Admitting: *Deleted

## 2015-04-24 ENCOUNTER — Inpatient Hospital Stay (HOSPITAL_COMMUNITY)
Admission: AD | Admit: 2015-04-24 | Discharge: 2015-04-24 | Disposition: A | Discharge status: Home / Self Care | Payer: Medicaid Other | Source: Ambulatory Visit | Attending: Obstetrics & Gynecology | Admitting: Obstetrics & Gynecology

## 2015-04-24 DIAGNOSIS — N946 Dysmenorrhea, unspecified: Secondary | ICD-10-CM

## 2015-04-24 DIAGNOSIS — N926 Irregular menstruation, unspecified: Secondary | ICD-10-CM | POA: Insufficient documentation

## 2015-04-24 LAB — POCT PREGNANCY, URINE: Preg Test, Ur: NEGATIVE

## 2015-04-24 MED ORDER — OXYCODONE-ACETAMINOPHEN 5-325 MG PO TABS: 1.0000 | ORAL_TABLET | ORAL | Status: DC | PRN

## 2015-04-24 NOTE — Progress Notes (Signed)
I received a referral from pt's friend who relayed to me that pt had had multiple miscarriages and had been trying to get pregnant for several years.  I offered spiritual care services to pt who declined at this time.  She is aware of ongoing availability for support.  [address] Dyanne Carrel, Bcc Pager, [phone] 12:49 PM     04/24/15 1200  Clinical Encounter Type  Visited With Patient (Friend)  Visit Type Initial  Referral From (Friend)

## 2015-04-24 NOTE — MAU Note (Signed)
Started with pink discharge on Wed.  Yesterday morning, had heavy bleeding, today the heavy bleeding has continued, changing hourly.  Had thought she was preg.  Lower abd tenderness and breast tenderness.  Home test was neg last wk

## 2015-04-24 NOTE — MAU Provider Note (Signed)
History     CSN: [id number]  Arrival date and time: 04/24/15 1050   First Provider Initiated Contact with Patient 04/24/15 1135         Chief Complaint  Patient presents with  . Vaginal Bleeding   HPI  Courtney Zamora is a 29 y.o. female who presents for vaginal bleeding.  Vaginal bleeding x 3 days, heavier since yesterday.  Abdominal cramping; rates pain 7/10. Has been taking tylenol & advil with minimal relief.  No birth control; states she is trying to get pregnant.  Unsure of LMP, thinks maybe in May; has irregular periods.  No PCP or gyn at this time d/t no insurance.  Some nausea, no vomiting.  No vaginal discharge.  No diarrhea or constipation.  No fever.     OB History    Gravida Para Term Preterm AB TAB SAB Ectopic Multiple Living   1    1  1    0      Past Medical History  Diagnosis Date  . Asthma   . Obesity   . Seizures (HCC)   . Obesity   . Anxiety     Past Surgical History  Procedure Laterality Date  . Tonsillectomy      Family History  Problem Relation Age of Onset  . Hypothyroidism Mother   . Diabetes Father   . Diabetes Paternal Uncle   . Hypertension Maternal Grandmother   . Hypertension Maternal Grandfather   . Hypertension Paternal Grandmother   . Heart disease Paternal Grandfather   . Hypertension Paternal Grandfather     Social History  Substance Use Topics  . Smoking status: Never Smoker   . Smokeless tobacco: Never Used  . Alcohol Use: No    Allergies:  Allergies  Allergen Reactions  . Bee Venom Anaphylaxis  . Cinnamon Anaphylaxis  . Ibuprofen Nausea Only    Prescriptions prior to admission  Medication Sig Dispense Refill Last Dose  . albuterol (PROVENTIL HFA;VENTOLIN HFA) 108 (90 BASE) MCG/ACT inhaler Inhale 2 puffs into the lungs every 6 (six) hours as needed for wheezing or shortness of breath (wheezing). For shortness of breath. 1 Inhaler 3   . Bioflavonoid Products (BIOFLEX PO) Take 1 tablet by mouth at  bedtime.    03/01/2015 at Unknown time  . cyclobenzaprine (FLEXERIL) 10 MG tablet Take 10 mg by mouth 2 (two) times daily.    03/01/2015 at Unknown time  . meloxicam (MOBIC) 15 MG tablet Take 15 mg by mouth daily.   03/01/2015 at Unknown time  . methocarbamol (ROBAXIN) 750 MG tablet Take 750 mg by mouth every 6 (six) hours as needed for muscle spasms.    03/01/2015 at Unknown time  . predniSONE (DELTASONE) 20 MG tablet Take 3 tablets (60 mg total) by mouth daily with breakfast. 12 tablet 0   . traMADol (ULTRAM) 50 MG tablet Take 1 tablet (50 mg total) by mouth every 6 (six) hours as needed. (Patient taking differently: Take 250 mg by mouth 2 (two) times daily as needed for moderate pain or severe pain. ) 15 tablet 0 03/01/2015 at Unknown time    Review of Systems  Constitutional: Negative.   Respiratory: Negative.   Cardiovascular: Negative.   Gastrointestinal: Positive for nausea and abdominal pain. Negative for vomiting, diarrhea and constipation.  Genitourinary: Negative for dysuria.       + vaginal bleeding  Neurological: Negative for dizziness and headaches.   Physical Exam   Blood pressure 135/92, pulse 83, temperature 98.6 F (  37 C), temperature source Oral, resp. rate 18, height 5\' 1"  (1.549 m), weight 318 lb 9.6 oz (144.516 kg).  Physical Exam  Nursing note and vitals reviewed. Constitutional: She is oriented to person, place, and time. She appears well-developed and well-nourished. She appears distressed (pt tearful).  HENT:  Head: Normocephalic and atraumatic.  Eyes: Conjunctivae are normal. Right eye exhibits no discharge. Left eye exhibits no discharge. No scleral icterus.  Neck: Normal range of motion.  Cardiovascular: Normal rate, regular rhythm and normal heart sounds.   No murmur heard. Respiratory: Effort normal and breath sounds normal. No respiratory distress. She has no wheezes.  GI: Soft. Bowel sounds are normal. There is no tenderness.  Genitourinary:  Pelvic exam  deferred per patient request.  Large pad filled 3/4 with red blood; pt last changed pad 3 hours ago.  Neurological: She is alert and oriented to person, place, and time.  Skin: Skin is warm and dry. She is not diaphoretic.  Psychiatric: She has a normal mood and affect. Her behavior is normal. Judgment and thought content normal.    MAU Course  Procedures Results for orders placed or performed during the hospital encounter of 04/24/15 (from the past 24 hour(s))  Pregnancy, urine POC     Status: None   Collection Time: 04/24/15  1:02 PM  Result Value Ref Range   Preg Test, Ur NEGATIVE NEGATIVE    MDM UPT negative Orthostatic VS normal Pt refuses blood work; states she passes out & has seizures with blood draws & IV starts.  Patient tearful b/c she wanted to be pregnant & has history of SAB. States doesn't have PCP b/c she only has 06/24/15 so she can't be treated for possible PCOS  Assessment and Plan  A: 1. Irregular menses   2. Dysmenorrhea    P: Discharge home Call [facility] to schedule primary care [facility] will call to schedule appointment Rx percocet  [facility], NP  04/24/2015, 11:12 AM

## 2015-04-24 NOTE — Discharge Instructions (Signed)
Abnormal Uterine Bleeding °Abnormal uterine bleeding can affect women at various stages in life, including teenagers, women in their reproductive years, pregnant women, and women who have reached menopause. Several kinds of uterine bleeding are considered abnormal, including: °· Bleeding or spotting between periods.   °· Bleeding after sexual intercourse.   °· Bleeding that is heavier or more than normal.   °· Periods that last longer than usual. °· Bleeding after menopause.   °Many cases of abnormal uterine bleeding are minor and simple to treat, while others are more serious. Any type of abnormal bleeding should be evaluated by your health care provider. Treatment will depend on the cause of the bleeding. °HOME CARE INSTRUCTIONS °Monitor your condition for any changes. The following actions may help to alleviate any discomfort you are experiencing: °· Avoid the use of tampons and douches as directed by your health care provider. °· Change your pads frequently. °You should get regular pelvic exams and Pap tests. Keep all follow-up appointments for diagnostic tests as directed by your health care provider.  °SEEK MEDICAL CARE IF:  °· Your bleeding lasts more than 1 week.   °· You feel dizzy at times.   °SEEK IMMEDIATE MEDICAL CARE IF:  °· You pass out.   °· You are changing pads every 15 to 30 minutes.   °· You have abdominal pain. °· You have a fever.   °· You become sweaty or weak.   °· You are passing large blood clots from the vagina.   °· You start to feel nauseous and vomit. °MAKE SURE YOU:  °· Understand these instructions. °· Will watch your condition. °· Will get help right away if you are not doing well or get worse. °  °This information is not intended to replace advice given to you by your health care provider. Make sure you discuss any questions you have with your health care provider. °  °Document Released: 07/04/2005 Document Revised: 07/09/2013 Document Reviewed: 01/31/2013 °Elsevier Interactive  Patient Education ©2016 [employer] ° °Polycystic Ovarian Syndrome °Polycystic ovarian syndrome (PCOS) is a common hormonal disorder among women of reproductive age. Most women with PCOS grow many small cysts on their ovaries. PCOS can cause problems with your periods and make it difficult to get pregnant. It can also cause an increased risk of miscarriage with pregnancy. If left untreated, PCOS can lead to serious health problems, such as diabetes and heart disease. °CAUSES °The cause of PCOS is not fully understood, but genetics may be a factor. °SIGNS AND SYMPTOMS  °· Infrequent or no menstrual periods.   °· Inability to get pregnant (infertility) because of not ovulating.   °· Increased growth of hair on the face, chest, stomach, back, thumbs, thighs, or toes.   °· Acne, oily skin, or dandruff.   °· Pelvic pain.   °· Weight gain or obesity, usually carrying extra weight around the waist.   °· Type 2 diabetes.    °· High cholesterol.   °· High blood pressure.   °· Female-pattern baldness or thinning hair.   °· Patches of thickened and dark brown or black skin on the neck, arms, breasts, or thighs.   °· Tiny excess flaps of skin (skin tags) in the armpits or neck area.   °· Excessive snoring and having breathing stop at times while asleep (sleep apnea).   °· Deepening of the voice.   °· Gestational diabetes when pregnant.   °DIAGNOSIS  °There is no single test to diagnose PCOS.  °· Your health care provider will:   °¨ Take a medical history.   °¨ Perform a pelvic exam.   °¨ Have ultrasonography done.   °¨ Check your   female and female hormone levels.   °¨ Measure glucose or sugar levels in the blood.   °¨ Do other blood tests.   °· If you are producing too many female hormones, your health care provider will make sure it is from PCOS. At the physical exam, your health care provider will want to evaluate the areas of increased hair growth. Try to allow natural hair growth for a few days before the visit.   °· During a  pelvic exam, the ovaries may be enlarged or swollen because of the increased number of small cysts. This can be seen more easily by using vaginal ultrasonography or screening to examine the ovaries and lining of the uterus (endometrium) for cysts. The uterine lining may become thicker if you have not been having a regular period.   °TREATMENT  °Because there is no cure for PCOS, it needs to be managed to prevent problems. Treatments are based on your symptoms. Treatment is also based on whether you want to have a baby or whether you need contraception.  °Treatment may include:  °· Progesterone hormone to start a menstrual period.   °· Birth control pills to make you have regular menstrual periods.   °· Medicines to make you ovulate, if you want to get pregnant.   °· Medicines to control your insulin.   °· Medicine to control your blood pressure.   °· Medicine and diet to control your high cholesterol and triglycerides in your blood. °· Medicine to reduce excessive hair growth.  °· Surgery, making small holes in the ovary, to decrease the amount of female hormone production. This is done through a long, lighted tube (laparoscope) placed into the pelvis through a tiny incision in the lower abdomen.   °HOME CARE INSTRUCTIONS °· Only take over-the-counter or prescription medicine as directed by your health care provider. °· Pay attention to the foods you eat and your activity levels. This can help reduce the effects of PCOS. °¨ Keep your weight under control. °¨ Eat foods that are low in carbohydrate and high in fiber. °¨ Exercise regularly. °SEEK MEDICAL CARE IF: °· Your symptoms do not get better with medicine. °· You have new symptoms. °  °This information is not intended to replace advice given to you by your health care provider. Make sure you discuss any questions you have with your health care provider. °  °Document Released: 10/28/2004 Document Revised: 04/24/2013 Document Reviewed: 12/20/2012 °Elsevier  Interactive Patient Education ©2016 [employer] Inc. ° °

## 2015-04-24 NOTE — MAU Note (Signed)
Pt unable to void at this time, discussed plan of care, refusing any and all blood work

## 2015-05-14 ENCOUNTER — Encounter: Payer: Self-pay | Admitting: Obstetrics & Gynecology

## 2015-09-03 ENCOUNTER — Emergency Department (HOSPITAL_BASED_OUTPATIENT_CLINIC_OR_DEPARTMENT_OTHER)
Admission: EM | Admit: 2015-09-03 | Discharge: 2015-09-04 | Disposition: A | Discharge status: Home / Self Care | Payer: Medicaid Other | Attending: Emergency Medicine | Admitting: Emergency Medicine

## 2015-09-03 ENCOUNTER — Encounter (HOSPITAL_BASED_OUTPATIENT_CLINIC_OR_DEPARTMENT_OTHER): Payer: Self-pay

## 2015-09-03 DIAGNOSIS — J4520 Mild intermittent asthma, uncomplicated: Secondary | ICD-10-CM | POA: Insufficient documentation

## 2015-09-03 DIAGNOSIS — F419 Anxiety disorder, unspecified: Secondary | ICD-10-CM | POA: Insufficient documentation

## 2015-09-03 DIAGNOSIS — Z79899 Other long term (current) drug therapy: Secondary | ICD-10-CM | POA: Insufficient documentation

## 2015-09-03 DIAGNOSIS — E669 Obesity, unspecified: Secondary | ICD-10-CM | POA: Insufficient documentation

## 2015-09-03 MED ORDER — PREDNISONE 20 MG PO TABS: ORAL_TABLET | ORAL | Status: DC

## 2015-09-03 MED ORDER — FLUTICASONE PROPIONATE 50 MCG/ACT NA SUSP: 2.0000 | Freq: Every day | NASAL | Status: DC

## 2015-09-03 MED ORDER — PREDNISONE 50 MG PO TABS
60.0000 mg | ORAL_TABLET | Freq: Once | ORAL | Status: AC
  Administered 2015-09-03: 60 mg via ORAL
  Filled 2015-09-03: qty 1

## 2015-09-03 MED ORDER — LORATADINE 10 MG PO TABS: 10.0000 mg | ORAL_TABLET | Freq: Every day | ORAL | Status: DC

## 2015-09-03 NOTE — Discharge Instructions (Signed)
Asthma, Adult Asthma is a condition of the lungs in which the airways tighten and narrow. Asthma can make it hard to breathe. Asthma cannot be cured, but medicine and lifestyle changes can help control it. Asthma may be started (triggered) by:  Animal skin flakes (dander).  Dust.  Cockroaches.  Pollen.  Mold.  Smoke.  Cleaning products.  Hair sprays or aerosol sprays.  Paint fumes or strong smells.  Cold air, weather changes, and winds.  Crying or laughing hard.  Stress.  Certain medicines or drugs.  Foods, such as dried fruit, potato chips, and sparkling grape juice.  Infections or conditions (colds, flu).  Exercise.  Certain medical conditions or diseases.  Exercise or tiring activities. HOME CARE   Take medicine as told by your doctor.  Use a peak flow meter as told by your doctor. A peak flow meter is a tool that measures how well the lungs are working.  Record and keep track of the peak flow meter's readings.  Understand and use the asthma action plan. An asthma action plan is a written plan for taking care of your asthma and treating your attacks.  To help prevent asthma attacks:  Do not smoke. Stay away from secondhand smoke.  Change your heating and air conditioning filter often.  Limit your use of fireplaces and wood stoves.  Get rid of pests (such as roaches and mice) and their droppings.  Throw away plants if you see mold on them.  Clean your floors. Dust regularly. Use cleaning products that do not smell.  Have someone vacuum when you are not home. Use a vacuum cleaner with a HEPA filter if possible.  Replace carpet with wood, tile, or vinyl flooring. Carpet can trap animal skin flakes and dust.  Use allergy-proof pillows, mattress covers, and box spring covers.  Wash bed sheets and blankets every week in hot water and dry them in a dryer.  Use blankets that are made of polyester or cotton.  Clean bathrooms and kitchens with bleach.  If possible, have someone repaint the walls in these rooms with mold-resistant paint. Keep out of the rooms that are being cleaned and painted.  Wash hands often. GET HELP IF:  You have make a whistling sound when breaking (wheeze), have shortness of breath, or have a cough even if taking medicine to prevent attacks.  The colored mucus you cough up (sputum) is thicker than usual.  The colored mucus you cough up changes from clear or white to yellow, green, gray, or bloody.  You have problems from the medicine you are taking such as:  A rash.  Itching.  Swelling.  Trouble breathing.  You need reliever medicines more than 2-3 times a week.  Your peak flow measurement is still at 50-79% of your personal best after following the action plan for 1 hour.  You have a fever. GET HELP RIGHT AWAY IF:   You seem to be worse and are not responding to medicine during an asthma attack.  You are short of breath even at rest.  You get short of breath when doing very little activity.  You have trouble eating, drinking, or talking.  You have chest pain.  You have a fast heartbeat.  Your lips or fingernails start to turn blue.  You are light-headed, dizzy, or faint.  Your peak flow is less than 50% of your personal best.   This information is not intended to replace advice given to you by your health care provider. Make sure   you discuss any questions you have with your health care provider.   Document Released: 12/21/2007 Document Revised: 03/25/2015 Document Reviewed: 01/31/2013 Elsevier Interactive Patient Education 2016 Elsevier Inc.  

## 2015-09-03 NOTE — ED Notes (Signed)
RT in for assessment-clear lungs

## 2015-09-03 NOTE — ED Notes (Signed)
C/o "feel like i'm not moving air"-NAD-states she has hx of asthma-last used inhaler at 1930-pt speaking in complete sentences with no resp distress-steady gait
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
## 2015-11-25 ENCOUNTER — Encounter (HOSPITAL_COMMUNITY): Payer: Self-pay | Admitting: Emergency Medicine

## 2015-11-25 ENCOUNTER — Emergency Department (HOSPITAL_COMMUNITY)
Admission: EM | Admit: 2015-11-25 | Discharge: 2015-11-26 | Disposition: A | Discharge status: Home / Self Care | Payer: Medicaid Other | Attending: Emergency Medicine | Admitting: Emergency Medicine

## 2015-11-25 DIAGNOSIS — J45901 Unspecified asthma with (acute) exacerbation: Secondary | ICD-10-CM | POA: Insufficient documentation

## 2015-11-25 DIAGNOSIS — Z79899 Other long term (current) drug therapy: Secondary | ICD-10-CM | POA: Insufficient documentation

## 2015-11-25 DIAGNOSIS — Z7951 Long term (current) use of inhaled steroids: Secondary | ICD-10-CM | POA: Insufficient documentation

## 2015-11-25 DIAGNOSIS — Z791 Long term (current) use of non-steroidal anti-inflammatories (NSAID): Secondary | ICD-10-CM | POA: Insufficient documentation

## 2015-11-25 DIAGNOSIS — E669 Obesity, unspecified: Secondary | ICD-10-CM | POA: Insufficient documentation

## 2015-11-25 DIAGNOSIS — Z8659 Personal history of other mental and behavioral disorders: Secondary | ICD-10-CM | POA: Insufficient documentation

## 2015-11-25 MED ORDER — PREDNISONE 20 MG PO TABS
60.0000 mg | ORAL_TABLET | Freq: Once | ORAL | Status: AC
  Administered 2015-11-25: 60 mg via ORAL
  Filled 2015-11-25: qty 3

## 2015-11-25 MED ORDER — PREDNISONE 20 MG PO TABS: 60.0000 mg | ORAL_TABLET | Freq: Every day | ORAL | Status: DC

## 2015-11-25 MED ORDER — ALBUTEROL SULFATE (2.5 MG/3ML) 0.083% IN NEBU
5.0000 mg | INHALATION_SOLUTION | Freq: Once | RESPIRATORY_TRACT | Status: AC
  Administered 2015-11-25: 5 mg via RESPIRATORY_TRACT
  Filled 2015-11-25: qty 6

## 2015-11-25 MED ORDER — IPRATROPIUM BROMIDE 0.02 % IN SOLN
0.5000 mg | Freq: Once | RESPIRATORY_TRACT | Status: AC
  Administered 2015-11-25: 0.5 mg via RESPIRATORY_TRACT
  Filled 2015-11-25: qty 2.5

## 2015-11-25 NOTE — ED Provider Notes (Signed)
CSN: [id number]     Arrival date & time 11/25/15  2216 History   First MD Initiated Contact with Patient 11/25/15 2240     Chief Complaint  Patient presents with  . Asthma     (Consider location/radiation/quality/duration/timing/severity/associated sxs/prior Treatment) HPI Comments: Patient with a history of Asthma presents today with a chief complaint of wheezing and SOB.  She reports onset of symptoms over the past couple of days.  Symptoms gradually worsening.  She has been using her Albuterol inhaler and Albuterol nebulizer with mild improvement.  She states that symptoms feel similar to symptoms that she has had in the past with an Asthma exacerbation.  She reports that she is recently getting over a sinus infection.  She denies any fever, chills, cough, or chest pain.  No prior hospitalizations or intubations for Asthma in the past.  Patient is a 30 y.o. female presenting with asthma. The history is provided by the patient.  Asthma    Past Medical History  Diagnosis Date  . Asthma   . Obesity   . Seizures (HCC)   . Obesity   . Anxiety    Past Surgical History  Procedure Laterality Date  . Tonsillectomy     Family History  Problem Relation Age of Onset  . Hypothyroidism Mother   . Diabetes Father   . Diabetes Paternal Uncle   . Hypertension Maternal Grandmother   . Hypertension Maternal Grandfather   . Hypertension Paternal Grandmother   . Heart disease Paternal Grandfather   . Hypertension Paternal Grandfather    Social History  Substance Use Topics  . Smoking status: Never Smoker   . Smokeless tobacco: Never Used  . Alcohol Use: No   OB History    Gravida Para Term Preterm AB TAB SAB Ectopic Multiple Living   1    1  1    0     Review of Systems  All other systems reviewed and are negative.     Allergies  Bee venom; Cinnamon; and Ibuprofen  Home Medications   Prior to Admission medications   Medication Sig Start Date End Date Taking? Authorizing  Provider  albuterol (PROVENTIL HFA;VENTOLIN HFA) 108 (90 BASE) MCG/ACT inhaler Inhale 2 puffs into the lungs every 6 (six) hours as needed for wheezing or shortness of breath (wheezing). For shortness of breath. 03/02/15   03/04/15, MD  Celecoxib (CELEBREX PO) Take by mouth.    Historical Provider, MD  Cyclobenzaprine HCl (FLEXERIL PO) Take by mouth.    Historical Provider, MD  fluticasone (FLONASE) 50 MCG/ACT nasal spray Place 2 sprays into both nostrils daily. 09/03/15   April Palumbo, MD  loratadine (CLARITIN) 10 MG tablet Take 1 tablet (10 mg total) by mouth daily. 09/03/15   April Palumbo, MD  Methocarbamol (ROBAXIN PO) Take by mouth.    Historical Provider, MD  predniSONE (DELTASONE) 20 MG tablet 3 tabs po day one, then 2 po daily x 4 days 09/03/15   April Palumbo, MD  TiZANidine HCl (ZANAFLEX PO) Take by mouth.    Historical Provider, MD  TRAMADOL HCL PO Take by mouth.    Historical Provider, MD   BP 157/99 mmHg  Pulse 101  Temp(Src) 99 F (37.2 C) (Oral)  Resp 18  SpO2 100%  LMP  Physical Exam  Constitutional: She appears well-developed and well-nourished.  HENT:  Head: Normocephalic and atraumatic.  Mouth/Throat: Oropharynx is clear and moist.  Neck: Normal range of motion. Neck supple.  Cardiovascular: Normal rate, regular  rhythm and normal heart sounds.   Pulmonary/Chest: Effort normal. She has decreased breath sounds. She has no wheezes. She has no rhonchi. She has no rales.  Musculoskeletal: Normal range of motion.  Neurological: She is alert.  Skin: Skin is warm and dry.  Psychiatric: She has a normal mood and affect.  Nursing note and vitals reviewed.   ED Course  Procedures (including critical care time) Labs Review Labs Reviewed - No data to display  Imaging Review No results found. I have personally reviewed and evaluated these images and lab results as part of my medical decision-making.   EKG Interpretation None     11:46 PM Reassessed  patient.  She reports symptoms have improved at this time.  Lungs CTAB. MDM   Final diagnoses:  None   Patient with a history of Asthma presents today with complaint SOB and wheezing, which she reports is similar to prior asthma exacerbations.  No hypoxia or signs of respiratory distress.  Symptoms improved after given Prednisone and breathing treatment.  Patient stable for discharge.  Return precautions given.    Santiago Glad, PA-C 11/25/15 2352  Bethann Berkshire, MD 11/26/15 [phone]

## 2015-11-25 NOTE — ED Notes (Signed)
Pt states she has had 2 asthma attacks today  Pt states she has a nebulizer and inhaler at home but they are not helping  Pt states it happens when she gets up and moves about

## 2015-12-02 ENCOUNTER — Inpatient Hospital Stay: Payer: Self-pay | Admitting: Critical Care Medicine

## 2015-12-09 ENCOUNTER — Encounter: Payer: Self-pay | Admitting: Critical Care Medicine

## 2015-12-09 ENCOUNTER — Ambulatory Visit: Payer: Self-pay | Attending: Critical Care Medicine | Admitting: Critical Care Medicine

## 2015-12-09 VITALS — BP 118/73 | HR 96 | Temp 98.3°F | Resp 18 | Ht 64.0 in | Wt 325.0 lb

## 2015-12-09 DIAGNOSIS — E669 Obesity, unspecified: Secondary | ICD-10-CM | POA: Insufficient documentation

## 2015-12-09 DIAGNOSIS — R300 Dysuria: Secondary | ICD-10-CM | POA: Insufficient documentation

## 2015-12-09 DIAGNOSIS — R569 Unspecified convulsions: Secondary | ICD-10-CM | POA: Insufficient documentation

## 2015-12-09 DIAGNOSIS — J4540 Moderate persistent asthma, uncomplicated: Secondary | ICD-10-CM | POA: Insufficient documentation

## 2015-12-09 DIAGNOSIS — F419 Anxiety disorder, unspecified: Secondary | ICD-10-CM | POA: Insufficient documentation

## 2015-12-09 DIAGNOSIS — Z79899 Other long term (current) drug therapy: Secondary | ICD-10-CM | POA: Insufficient documentation

## 2015-12-09 LAB — POCT URINALYSIS DIPSTICK
BILIRUBIN UA: NEGATIVE
Glucose, UA: NEGATIVE
KETONES UA: NEGATIVE
Nitrite, UA: NEGATIVE
PH UA: 5.5
PROTEIN UA: NEGATIVE
SPEC GRAV UA: 1.025
Urobilinogen, UA: 0.2

## 2015-12-09 MED ORDER — ALBUTEROL SULFATE HFA 108 (90 BASE) MCG/ACT IN AERS: 2.0000 | INHALATION_SPRAY | Freq: Four times a day (QID) | RESPIRATORY_TRACT | Status: DC | PRN

## 2015-12-09 MED ORDER — BUDESONIDE-FORMOTEROL FUMARATE 160-4.5 MCG/ACT IN AERO: 2.0000 | INHALATION_SPRAY | Freq: Two times a day (BID) | RESPIRATORY_TRACT | Status: DC

## 2015-12-09 MED ORDER — FLUTICASONE PROPIONATE 50 MCG/ACT NA SUSP: 2.0000 | Freq: Every day | NASAL | Status: DC

## 2015-12-09 MED ORDER — PREDNISONE 10 MG PO TABS: ORAL_TABLET | ORAL | Status: DC

## 2015-12-09 MED FILL — predniSONE 10 MG TABS: 10 | 9 days supply | Qty: 30 | Fill #0

## 2015-12-09 MED FILL — VENTOLIN HFA 90 MCG INHALER: 108 (90 BAS | 20 days supply | Qty: 18 | Fill #0

## 2015-12-09 MED FILL — FLUTICASONE PROP 50 MCG SPR: 50 | 20 days supply | Qty: 16 | Fill #0

## 2015-12-09 NOTE — Assessment & Plan Note (Signed)
Moderate persistent asthma  Flare Environmental factors, allergies Plan Pulse prednisone Start symbicort two puff bid 160 Prn albuterol Start fluticasone two puff ea nostril daily

## 2015-12-09 NOTE — Assessment & Plan Note (Signed)
Moderate dysuria Plan chk urine culture

## 2015-12-09 NOTE — Patient Instructions (Addendum)
Prednisone 10mg  Take 4 for three days 3 for three days 2 for three days 1 for three days and stop Start Symbicort two puff twice daily Albuterol as needed reordered Resume fluticasone nasal spray two puff ea nostril daily Stop claritin A urine culture will be obtained A primary care MD will be obtained Return 2 months
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
## 2015-12-09 NOTE — Addendum Note (Signed)
Addended by: Margaretmary Lombard on: 12/09/2015 12:15 PM   Modules accepted: Orders

## 2015-12-09 NOTE — Progress Notes (Signed)
Subjective:    Patient ID: Courtney Zamora, female    DOB: 1985/09/25, 30 y.o.   MRN: [id number]  HPI Comments: Post ED f/u of asthma. Lifelong asthma   Asthma She complains of chest tightness, cough, difficulty breathing, frequent throat clearing, hoarse voice, shortness of breath, sputum production and wheezing. There is no hemoptysis. Primary symptoms comments: Postnasal drip, sinus issues. This is a chronic problem. The current episode started more than 1 year ago. The problem occurs 2 to 4 times per day (worse if active, ok at rest , qhs dyspnea two times per week). The problem has been unchanged. The cough is productive of sputum (clear ). Associated symptoms include dyspnea on exertion, ear pain, headaches, heartburn, nasal congestion, orthopnea, postnasal drip, rhinorrhea, sneezing and a sore throat. Pertinent negatives include no chest pain, ear congestion, fever or trouble swallowing. Associated symptoms comments: Chest tightness. Her symptoms are aggravated by emotional stress, exercise, any activity, change in weather, exposure to fumes, exposure to smoke, pollen, occupational exposure and strenuous activity. Risk factors for lung disease include occupational exposure. Her past medical history is significant for asthma. There is no history of bronchiectasis, bronchitis, emphysema or pneumonia. Past medical history comments: No skin testing ,  Pollen allergies, lysol issues, fumes are an issue.  c/o dysuria PUL ASTHMA HISTORY 12/09/2015  Symptoms Throughout the day  Nighttime awakenings >1/wk but not nightly  Interference with activity Extreme limitations  SABA use Several times/day  Exacerbations requiring oral steroids 2 or more / year    Past Medical History  Diagnosis Date  . Asthma   . Obesity   . Seizures (HCC)   . Obesity   . Anxiety      Family History  Problem Relation Age of Onset  . Hypothyroidism Mother   . Diabetes Father   . Diabetes Paternal Uncle   .  Hypertension Maternal Grandmother   . Hypertension Maternal Grandfather   . Hypertension Paternal Grandmother   . Heart disease Paternal Grandfather   . Hypertension Paternal Grandfather      Social History   Social History  . Marital Status: Legally Separated    Spouse Name: N/A  . Number of Children: N/A  . Years of Education: N/A   Occupational History  . Not on file.   Social History Main Topics  . Smoking status: Never Smoker   . Smokeless tobacco: Never Used  . Alcohol Use: No  . Drug Use: No  . Sexual Activity: Yes    Birth Control/ Protection: None   Other Topics Concern  . Not on file   Social History Narrative     Allergies  Allergen Reactions  . Bee Venom Anaphylaxis  . Cinnamon Anaphylaxis  . Ibuprofen Nausea Only     Outpatient Prescriptions Prior to Visit  Medication Sig Dispense Refill  . Celecoxib (CELEBREX PO) Take by mouth.    . Cyclobenzaprine HCl (FLEXERIL PO) Take by mouth.    . Methocarbamol (ROBAXIN PO) Take by mouth.    . TiZANidine HCl (ZANAFLEX PO) Take by mouth.    . TRAMADOL HCL PO Take by mouth.    Marland Kitchen albuterol (PROVENTIL HFA;VENTOLIN HFA) 108 (90 BASE) MCG/ACT inhaler Inhale 2 puffs into the lungs every 6 (six) hours as needed for wheezing or shortness of breath (wheezing). For shortness of breath. 1 Inhaler 3  . fluticasone (FLONASE) 50 MCG/ACT nasal spray Place 2 sprays into both nostrils daily. 16 g 0  . predniSONE (DELTASONE) 20 MG tablet  Take 3 tablets (60 mg total) by mouth daily. 12 tablet 0  . loratadine (CLARITIN) 10 MG tablet Take 1 tablet (10 mg total) by mouth daily. (Patient not taking: Reported on 12/09/2015) 30 tablet 0   No facility-administered medications prior to visit.      Review of Systems  Constitutional: Positive for fatigue. Negative for fever.  HENT: Positive for ear pain, hoarse voice, postnasal drip, rhinorrhea, sneezing and sore throat. Negative for trouble swallowing.   Respiratory: Positive for  cough, sputum production, chest tightness, shortness of breath and wheezing. Negative for hemoptysis.   Cardiovascular: Positive for dyspnea on exertion. Negative for chest pain.  Gastrointestinal: Positive for heartburn.       Gerd  Genitourinary: Positive for dysuria and frequency.  Neurological: Positive for headaches.       Objective:   Physical Exam  Filed Vitals:   12/09/15 1148  BP: 118/73  Pulse: 96  Temp: 98.3 F (36.8 C)  TempSrc: Oral  Resp: 18  Height: 5\' 4"  (1.626 m)  Weight: 325 lb (147.419 kg)  SpO2: 100%    Gen: Pleasant, well-nourished, in no distress,  normal affect  with erythema and turbinate edema,  mouth clear,  oropharynx clear, Mod postnasal drip  Neck: No JVD, no TMG, no carotid bruits  Lungs: No use of accessory muscles, no dullness to percussion,exp insp wheezes  Cardiovascular: RRR, heart sounds normal, no murmur or gallops, no peripheral edema  Abdomen: soft and NT, no HSM,  BS normal  Musculoskeletal: No deformities, no cyanosis or clubbing  Neuro: alert, non focal  Skin: Warm, no lesions or rashes  No results found. cxr : nad  2015 U/a  Trace leukocytes,   Clear      Assessment & Plan:  I personally reviewed all images and lab data in the [facility] system as well as any outside material available during this office visit and agree with the  radiology impressions.   Asthma, moderate persistent Moderate persistent asthma  Flare Environmental factors, allergies Plan Pulse prednisone Start symbicort two puff bid 160 Prn albuterol Start fluticasone two puff ea nostril daily  Dysuria Moderate dysuria Plan chk urine culture     Samary was seen today for asthma.  Diagnoses and all orders for this visit:  Asthma, moderate persistent, uncomplicated  Dysuria -     Urine culture -     Pregnancy, urine  Other orders -     Discontinue: predniSONE (DELTASONE) 10 MG tablet; Take 4 for three days 3 for three days 2 for  three days 1 for three days and stop -     Discontinue: albuterol (PROVENTIL HFA;VENTOLIN HFA) 108 (90 Base) MCG/ACT inhaler; Inhale 2 puffs into the lungs every 6 (six) hours as needed for wheezing or shortness of breath (wheezing). For shortness of breath. -     Discontinue: budesonide-formoterol (SYMBICORT) 160-4.5 MCG/ACT inhaler; Inhale 2 puffs into the lungs 2 (two) times daily. -     Discontinue: fluticasone (FLONASE) 50 MCG/ACT nasal spray; Place 2 sprays into both nostrils daily. -     albuterol (PROVENTIL HFA;VENTOLIN HFA) 108 (90 Base) MCG/ACT inhaler; Inhale 2 puffs into the lungs every 6 (six) hours as needed for wheezing or shortness of breath (wheezing). For shortness of breath. -     budesonide-formoterol (SYMBICORT) 160-4.5 MCG/ACT inhaler; Inhale 2 puffs into the lungs 2 (two) times daily. -     fluticasone (FLONASE) 50 MCG/ACT nasal spray; Place 2 sprays into both nostrils daily. -  Discontinue: predniSONE (DELTASONE) 10 MG tablet; Take 4 for three days 3 for three days 2 for three days 1 for three days and stop -     predniSONE (DELTASONE) 10 MG tablet; Take 4 for three days 3 for three days 2 for three days 1 for three days and stop

## 2015-12-10 LAB — PREGNANCY, URINE: Preg Test, Ur: NEGATIVE

## 2015-12-11 LAB — URINE CULTURE
Colony Count: NO GROWTH
ORGANISM ID, BACTERIA: NO GROWTH

## 2015-12-17 ENCOUNTER — Encounter (HOSPITAL_COMMUNITY): Payer: Self-pay | Admitting: Emergency Medicine

## 2015-12-17 ENCOUNTER — Emergency Department (HOSPITAL_COMMUNITY)
Admission: EM | Admit: 2015-12-17 | Discharge: 2015-12-17 | Disposition: A | Discharge status: Home / Self Care | Payer: Medicaid Other | Attending: Emergency Medicine | Admitting: Emergency Medicine

## 2015-12-17 DIAGNOSIS — E669 Obesity, unspecified: Secondary | ICD-10-CM | POA: Insufficient documentation

## 2015-12-17 DIAGNOSIS — J4520 Mild intermittent asthma, uncomplicated: Secondary | ICD-10-CM | POA: Insufficient documentation

## 2015-12-17 MED ORDER — IPRATROPIUM BROMIDE 0.02 % IN SOLN
0.5000 mg | Freq: Once | RESPIRATORY_TRACT | Status: AC
  Administered 2015-12-17: 0.5 mg via RESPIRATORY_TRACT
  Filled 2015-12-17: qty 2.5

## 2015-12-17 MED ORDER — ALBUTEROL SULFATE (2.5 MG/3ML) 0.083% IN NEBU
5.0000 mg | INHALATION_SOLUTION | Freq: Once | RESPIRATORY_TRACT | Status: AC
  Administered 2015-12-17: 5 mg via RESPIRATORY_TRACT
  Filled 2015-12-17: qty 6

## 2015-12-17 NOTE — Discharge Instructions (Signed)
Finish your prednisone prescription.  Continue home nebulizer treatments at home--- May do these every 4-6 hours as needed for shortness of breath/wheezing, use inhaler for rescue. Follow-up with your primary care doctor. Return to the ED for new or worsening symptoms.

## 2015-12-17 NOTE — ED Notes (Signed)
Pt states hx of asthma.  Has had a cough since last night.  Used inhaler and neb but it is not working.  Called her doctor and they told her to come here.  Pt able to speak in full sentences.

## 2015-12-17 NOTE — ED Provider Notes (Signed)
CSN: [id number]     Arrival date & time 12/17/15  1612 History  By signing my name below, I, [name], attest that this documentation has been prepared under the direction and in the presence of Sharilyn Sites, PA-C Electronically Signed: [name], ED Scribe. 12/17/2015. 5:14 PM.   Chief Complaint  Patient presents with  . Asthma      The history is provided by the patient. No language interpreter was used.    HPI Comments: Courtney Zamora is a 30 y.o. female with a PMHx of asthma, who presents to the Emergency Department complaining of exacerbation of asthma onset last night. Pt reports that she initially had a cough last night prior to the onset of her symptoms. Pt notes that she called her PCP and they informed her to come into the ED for further evaluation. Pt states that she typically has asthma exacerbations at the beginning of spring. Pt reports that she is at the end of her prednisone treatment that was prescribed by her PCP earlier in the week. She states that she is having associated symptoms of cough. She states that she has tried albuterol rescue inhaler this morning and neb treatment with albuterol solution last night with no relief for her symptoms. Has not used any neb treatments today.  She denies any other symptoms.   Past Medical History  Diagnosis Date  . Asthma   . Obesity   . Seizures (HCC)   . Obesity   . Anxiety    Past Surgical History  Procedure Laterality Date  . Tonsillectomy     Family History  Problem Relation Age of Onset  . Hypothyroidism Mother   . Diabetes Father   . Diabetes Paternal Uncle   . Hypertension Maternal Grandmother   . Hypertension Maternal Grandfather   . Hypertension Paternal Grandmother   . Heart disease Paternal Grandfather   . Hypertension Paternal Grandfather    Social History  Substance Use Topics  . Smoking status: Never Smoker   . Smokeless tobacco: Never Used  . Alcohol Use: No   OB History    Gravida Para  Term Preterm AB TAB SAB Ectopic Multiple Living   1    1  1    0     Review of Systems  Constitutional: Negative for fever and chills.  Respiratory: Positive for cough.   All other systems reviewed and are negative.     Allergies  Bee venom; Cinnamon; and Ibuprofen  Home Medications   Prior to Admission medications   Medication Sig Start Date End Date Taking? Authorizing Provider  albuterol (PROVENTIL HFA;VENTOLIN HFA) 108 (90 Base) MCG/ACT inhaler Inhale 2 puffs into the lungs every 6 (six) hours as needed for wheezing or shortness of breath (wheezing). For shortness of breath. 12/09/15  Yes 12/11/15, MD  celecoxib (CELEBREX) 200 MG capsule Take 200 mg by mouth daily.   Yes Historical Provider, MD  cyclobenzaprine (FLEXERIL) 10 MG tablet Take 10 mg by mouth at bedtime.   Yes Historical Provider, MD  fluticasone (FLONASE) 50 MCG/ACT nasal spray Place 2 sprays into both nostrils daily. 12/09/15  Yes 12/11/15, MD  methocarbamol (ROBAXIN) 500 MG tablet Take 500 mg by mouth 2 (two) times daily.   Yes Historical Provider, MD  predniSONE (DELTASONE) 10 MG tablet Take 4 for three days 3 for three days 2 for three days 1 for three days and stop 12/09/15  Yes 12/11/15, MD  tiZANidine (ZANAFLEX) 4 MG  tablet Take 4 mg by mouth daily.   Yes Historical Provider, MD  traMADol (ULTRAM) 50 MG tablet Take 50 mg by mouth every 6 (six) hours as needed for moderate pain or severe pain.   Yes Historical Provider, MD  budesonide-formoterol (SYMBICORT) 160-4.5 MCG/ACT inhaler Inhale 2 puffs into the lungs 2 (two) times daily. 12/09/15   Storm Frisk, MD   BP 144/82 mmHg  Pulse 88  Temp(Src) 99.2 F (37.3 C) (Oral)  Resp 20  SpO2 100%  LMP 09/07/2015   Physical Exam  Constitutional: She is oriented to person, place, and time. She appears well-developed and well-nourished.  HENT:  Head: Normocephalic and atraumatic.  Mouth/Throat: Oropharynx is clear and moist.  Eyes:  Conjunctivae and EOM are normal. Pupils are equal, round, and reactive to light.  Neck: Normal range of motion.  Cardiovascular: Normal rate, regular rhythm and normal heart sounds.   Pulmonary/Chest: Effort normal. No respiratory distress. She has wheezes. She has no rales.  Faint wheezes in the bases bilaterally. No retractions or distress. Speaking in full sentences without difficulty.   Abdominal: Soft. Bowel sounds are normal.  Musculoskeletal: Normal range of motion.  Neurological: She is alert and oriented to person, place, and time.  Skin: Skin is warm and dry.  Psychiatric: She has a normal mood and affect.  Nursing note and vitals reviewed.   ED Course  Procedures (including critical care time) DIAGNOSTIC STUDIES: Oxygen Saturation is 100% on RA, nl by my interpretation.    COORDINATION OF CARE: 5:14 PM Discussed treatment plan with pt at bedside which includes breathing treatment and pt agreed to plan.    Labs Review Labs Reviewed - No data to display  Imaging Review No results found.    EKG Interpretation None      MDM   Final diagnoses:  Asthma, mild intermittent, uncomplicated   30 y.o. F here with uncomplicated asthma exacerbation.  Reports similar symptoms recently, currently on prednisone taper from PCP.  Patient afebrile, non-toxic.  Faint expiratory wheezes at bases, no distress.  Symptoms resolved after single neb treatment here.  Advised she may continue neb treatments at home every 4-6 hours as needed for wheezing, continue inhaler for rescue.  Recommended to finish prednisone taper.  Follow-up with PCP.  Discussed plan with patient, he/she acknowledged understanding and agreed with plan of care.  Return precautions given for new or worsening symptoms.  I personally performed the services described in this documentation, which was scribed in my presence. The recorded information has been reviewed and is accurate.  Garlon Hatchet, PA-C 12/17/15  1808  Lavera Guise, MD 12/19/15 [phone]

## 2015-12-22 ENCOUNTER — Ambulatory Visit: Payer: Medicaid Other | Attending: Internal Medicine

## 2015-12-22 MED FILL — SYMBICORT 160-4.5 MCG INH: 160-4.5 | 20 days supply | Qty: 10 | Fill #0

## 2015-12-22 MED FILL — predniSONE 10 MG TABS: 10 | 12 days supply | Qty: 30 | Fill #0

## 2015-12-29 ENCOUNTER — Encounter: Payer: Self-pay | Admitting: Family Medicine

## 2015-12-29 ENCOUNTER — Ambulatory Visit: Payer: Self-pay | Attending: Family Medicine | Admitting: Family Medicine

## 2015-12-29 VITALS — BP 136/80 | HR 71 | Temp 98.4°F | Resp 16 | Ht 64.0 in | Wt 334.0 lb

## 2015-12-29 DIAGNOSIS — M25572 Pain in left ankle and joints of left foot: Secondary | ICD-10-CM

## 2015-12-29 DIAGNOSIS — G8929 Other chronic pain: Secondary | ICD-10-CM | POA: Insufficient documentation

## 2015-12-29 DIAGNOSIS — M25561 Pain in right knee: Secondary | ICD-10-CM | POA: Insufficient documentation

## 2015-12-29 DIAGNOSIS — Z9889 Other specified postprocedural states: Secondary | ICD-10-CM | POA: Insufficient documentation

## 2015-12-29 DIAGNOSIS — J4540 Moderate persistent asthma, uncomplicated: Secondary | ICD-10-CM | POA: Insufficient documentation

## 2015-12-29 DIAGNOSIS — F40231 Fear of injections and transfusions: Secondary | ICD-10-CM | POA: Insufficient documentation

## 2015-12-29 DIAGNOSIS — K219 Gastro-esophageal reflux disease without esophagitis: Secondary | ICD-10-CM | POA: Insufficient documentation

## 2015-12-29 DIAGNOSIS — Z79899 Other long term (current) drug therapy: Secondary | ICD-10-CM | POA: Insufficient documentation

## 2015-12-29 DIAGNOSIS — Z6841 Body Mass Index (BMI) 40.0 and over, adult: Secondary | ICD-10-CM | POA: Insufficient documentation

## 2015-12-29 MED ORDER — OMEPRAZOLE 20 MG PO CPDR: 20.0000 mg | DELAYED_RELEASE_CAPSULE | Freq: Every day | ORAL | Status: DC

## 2015-12-29 MED ORDER — METFORMIN HCL ER 500 MG PO TB24: 1000.0000 mg | ORAL_TABLET | Freq: Every day | ORAL | Status: DC

## 2015-12-29 MED ORDER — CETIRIZINE HCL 10 MG PO TABS: 10.0000 mg | ORAL_TABLET | Freq: Every day | ORAL | Status: DC

## 2015-12-29 MED ORDER — ALBUTEROL SULFATE HFA 108 (90 BASE) MCG/ACT IN AERS: 2.0000 | INHALATION_SPRAY | Freq: Four times a day (QID) | RESPIRATORY_TRACT | Status: DC | PRN

## 2015-12-29 MED ORDER — FLUTICASONE PROPIONATE 50 MCG/ACT NA SUSP: 2.0000 | Freq: Every day | NASAL | Status: DC

## 2015-12-29 MED ORDER — BUDESONIDE-FORMOTEROL FUMARATE 160-4.5 MCG/ACT IN AERO: 2.0000 | INHALATION_SPRAY | Freq: Two times a day (BID) | RESPIRATORY_TRACT | Status: DC

## 2015-12-29 MED ORDER — DICLOFENAC SODIUM 75 MG PO TBEC: 75.0000 mg | DELAYED_RELEASE_TABLET | Freq: Two times a day (BID) | ORAL | Status: DC

## 2015-12-29 MED ORDER — TIZANIDINE HCL 4 MG PO TABS: 4.0000 mg | ORAL_TABLET | Freq: Four times a day (QID) | ORAL | Status: DC | PRN

## 2015-12-29 MED ORDER — ACETAMINOPHEN-CODEINE #3 300-30 MG PO TABS: 1.0000 | ORAL_TABLET | Freq: Four times a day (QID) | ORAL | Status: DC | PRN

## 2015-12-29 NOTE — Assessment & Plan Note (Signed)
GERD in setting of asthma Add PPI Counseled patient on small meals, avoiding common triggers

## 2015-12-29 NOTE — Assessment & Plan Note (Signed)
Morbid obesity, worsening  Add metformin contrave is not an option as she is on chronic narcotics

## 2015-12-29 NOTE — Assessment & Plan Note (Signed)
A: asthma. No hypoxia or resp distress P: Continue symbicort and albuterol Add zyrtec Add PPI Chest x-ray although by suspicion for infection is low

## 2015-12-29 NOTE — Patient Instructions (Addendum)
Courtney Zamora was seen today for uri.  Diagnoses and all orders for this visit:  Asthma, moderate persistent, uncomplicated -     cetirizine (ZYRTEC) 10 MG tablet; Take 1 tablet (10 mg total) by mouth daily. -     fluticasone (FLONASE) 50 MCG/ACT nasal spray; Place 2 sprays into both nostrils daily. -     budesonide-formoterol (SYMBICORT) 160-4.5 MCG/ACT inhaler; Inhale 2 puffs into the lungs 2 (two) times daily. -     albuterol (PROVENTIL HFA;VENTOLIN HFA) 108 (90 Base) MCG/ACT inhaler; Inhale 2 puffs into the lungs every 6 (six) hours as needed for wheezing or shortness of breath (wheezing). For shortness of breath.  Gastroesophageal reflux disease, esophagitis presence not specified -     omeprazole (PRILOSEC) 20 MG capsule; Take 1 capsule (20 mg total) by mouth daily.  Morbid obesity, unspecified obesity type (HCC) -     metFORMIN (GLUCOPHAGE XR) 500 MG 24 hr tablet; Take 2 tablets (1,000 mg total) by mouth daily after supper.  Chronic pain of right knee -     tiZANidine (ZANAFLEX) 4 MG tablet; Take 1 tablet (4 mg total) by mouth every 6 (six) hours as needed for muscle spasms. -     diclofenac (VOLTAREN) 75 MG EC tablet; Take 1 tablet (75 mg total) by mouth 2 (two) times daily. Take with food -     acetaminophen-codeine (TYLENOL #3) 300-30 MG tablet; Take 1 tablet by mouth every 6 (six) hours as needed for moderate pain.  Chronic pain of left ankle -     tiZANidine (ZANAFLEX) 4 MG tablet; Take 1 tablet (4 mg total) by mouth every 6 (six) hours as needed for muscle spasms. -     diclofenac (VOLTAREN) 75 MG EC tablet; Take 1 tablet (75 mg total) by mouth 2 (two) times daily. Take with food -     acetaminophen-codeine (TYLENOL #3) 300-30 MG tablet; Take 1 tablet by mouth every 6 (six) hours as needed for moderate pain.  Severe needle phobia   Contrave is not an option due to use of chronic pain medicine Metformin XR this will help reduce appetite and help with any possible insulin resistance.     F/u in  8 weeks for weight check and asthma, sooner if needed   Dr. Armen Pickup

## 2015-12-29 NOTE — Progress Notes (Signed)
Subjective:  Patient ID: Courtney Zamora, female    DOB: 1985-08-19  Age: 30 y.o. MRN: [id number]  CC: URI   HPI Courtney Zamora presents for   1. Asthma: she is having cough for the past 3 months. With SOB. She went to ED on 12/17/15 for asthma.  Cough is intermittently productive. She has asthma. She is a chronic non smoker. No fever or CP. No chills. She felt better on prednisone. She gained weight with prednisone. She is morbidly obese and has been > 300 # since highschool. She admits to reflux symptoms at night. She is using flonase. She is not using an anti-histamine. She has tried Singulair in the past, but states that it worsened her asthma symptoms.   2. Chronic pain: in L ankle and R knee. She reports L ankle fracture in 2014. She was treated by ortho. She has been on chronic narcotics since them. She was initially on vicodin but then transitioned to tramadol. She reports pain is poorly controlled. She was also taking diclofenac, celebrex, flexeril, robaxin and zanaflex. She reports that diclofenac was the best of the 2 NSAIDs. zanaflex was the best of the 3 musclerelaxers.   3. Morbid obesity: most of her life. She has tried dieting without success. She denies excessive appetite. She denies hx of diabetes. She is having trouble conceiving with one first trimester miscarriage in the past 3 years. She denies hx of diabetes but refuses blood draws and finger sticks. She does not exercise.    Social History  Substance Use Topics  . Smoking status: Never Smoker   . Smokeless tobacco: Never Used  . Alcohol Use: No   Past Surgical History  Procedure Laterality Date  . Tonsillectomy     Outpatient Prescriptions Prior to Visit  Medication Sig Dispense Refill  . albuterol (PROVENTIL HFA;VENTOLIN HFA) 108 (90 Base) MCG/ACT inhaler Inhale 2 puffs into the lungs every 6 (six) hours as needed for wheezing or shortness of breath (wheezing). For shortness of breath. 1 Inhaler 3  .  budesonide-formoterol (SYMBICORT) 160-4.5 MCG/ACT inhaler Inhale 2 puffs into the lungs 2 (two) times daily. 1 Inhaler 12  . celecoxib (CELEBREX) 200 MG capsule Take 200 mg by mouth daily.    . cyclobenzaprine (FLEXERIL) 10 MG tablet Take 10 mg by mouth at bedtime.    . fluticasone (FLONASE) 50 MCG/ACT nasal spray Place 2 sprays into both nostrils daily. 16 g 6  . methocarbamol (ROBAXIN) 500 MG tablet Take 500 mg by mouth 2 (two) times daily.    Marland Kitchen tiZANidine (ZANAFLEX) 4 MG tablet Take 4 mg by mouth daily.    . traMADol (ULTRAM) 50 MG tablet Take 50 mg by mouth every 6 (six) hours as needed for moderate pain or severe pain.    . predniSONE (DELTASONE) 10 MG tablet Take 4 for three days 3 for three days 2 for three days 1 for three days and stop (Patient not taking: Reported on 12/29/2015) 30 tablet 0   No facility-administered medications prior to visit.    ROS Review of Systems  Constitutional: Positive for fatigue. Negative for fever.  HENT: Positive for ear pain, postnasal drip, rhinorrhea, sneezing and sore throat. Negative for trouble swallowing.   Respiratory: Positive for cough, chest tightness, shortness of breath and wheezing.   Cardiovascular: Negative for chest pain.  Gastrointestinal:       Gerd  Musculoskeletal: Positive for arthralgias.  Neurological: Negative for headaches.    Objective:  BP 136/80 mmHg  Pulse 71  Temp(Src) 98.4 F (36.9 C) (Oral)  Resp 16  Ht 5\' 4"  (1.626 m)  Wt 334 lb (151.501 kg)  BMI 57.30 kg/m2  SpO2 100%  LMP 12/11/2015  BP/Weight 12/29/2015 12/17/2015 12/09/2015  Systolic BP 136 138 118  Diastolic BP 80 62 73  Wt. (Lbs) 334 - 325  BMI 57.3 - 55.76   Physical Exam  Constitutional: She is oriented to person, place, and time. She appears well-developed and well-nourished. No distress.  HENT:  Head: Normocephalic and atraumatic.  Cardiovascular: Normal rate, regular rhythm, normal heart sounds and intact distal pulses.   Pulmonary/Chest:  Effort normal and breath sounds normal.  Musculoskeletal: She exhibits no edema.  Neurological: She is alert and oriented to person, place, and time.  Skin: Skin is warm and dry. No rash noted.  Psychiatric: She has a normal mood and affect.    Assessment & Plan:   There are no diagnoses linked to this encounter. Courtney Zamora was seen today for uri.  Diagnoses and all orders for this visit:  Asthma, moderate persistent, uncomplicated -     cetirizine (ZYRTEC) 10 MG tablet; Take 1 tablet (10 mg total) by mouth daily. -     fluticasone (FLONASE) 50 MCG/ACT nasal spray; Place 2 sprays into both nostrils daily. -     budesonide-formoterol (SYMBICORT) 160-4.5 MCG/ACT inhaler; Inhale 2 puffs into the lungs 2 (two) times daily. -     albuterol (PROVENTIL HFA;VENTOLIN HFA) 108 (90 Base) MCG/ACT inhaler; Inhale 2 puffs into the lungs every 6 (six) hours as needed for wheezing or shortness of breath (wheezing). For shortness of breath. -     DG Chest 2 View; Future  Gastroesophageal reflux disease, esophagitis presence not specified -     omeprazole (PRILOSEC) 20 MG capsule; Take 1 capsule (20 mg total) by mouth daily.  Morbid obesity, unspecified obesity type (HCC) -     metFORMIN (GLUCOPHAGE XR) 500 MG 24 hr tablet; Take 2 tablets (1,000 mg total) by mouth daily after supper.  Chronic pain of right knee -     tiZANidine (ZANAFLEX) 4 MG tablet; Take 1 tablet (4 mg total) by mouth every 6 (six) hours as needed for muscle spasms. -     diclofenac (VOLTAREN) 75 MG EC tablet; Take 1 tablet (75 mg total) by mouth 2 (two) times daily. Take with food -     acetaminophen-codeine (TYLENOL #3) 300-30 MG tablet; Take 1 tablet by mouth every 6 (six) hours as needed for moderate pain.  Chronic pain of left ankle -     tiZANidine (ZANAFLEX) 4 MG tablet; Take 1 tablet (4 mg total) by mouth every 6 (six) hours as needed for muscle spasms. -     diclofenac (VOLTAREN) 75 MG EC tablet; Take 1 tablet (75 mg total) by  mouth 2 (two) times daily. Take with food -     acetaminophen-codeine (TYLENOL #3) 300-30 MG tablet; Take 1 tablet by mouth every 6 (six) hours as needed for moderate pain.  Severe needle phobia   Meds ordered this encounter  Medications  . cetirizine (ZYRTEC) 10 MG tablet    Sig: Take 1 tablet (10 mg total) by mouth daily.    Dispense:  30 tablet    Refill:  11  . omeprazole (PRILOSEC) 20 MG capsule    Sig: Take 1 capsule (20 mg total) by mouth daily.    Dispense:  30 capsule    Refill:  3  . tiZANidine (ZANAFLEX) 4 MG  tablet    Sig: Take 1 tablet (4 mg total) by mouth every 6 (six) hours as needed for muscle spasms.    Dispense:  120 tablet    Refill:  2  . fluticasone (FLONASE) 50 MCG/ACT nasal spray    Sig: Place 2 sprays into both nostrils daily.    Dispense:  16 g    Refill:  6  . diclofenac (VOLTAREN) 75 MG EC tablet    Sig: Take 1 tablet (75 mg total) by mouth 2 (two) times daily. Take with food    Dispense:  180 tablet    Refill:  1  . budesonide-formoterol (SYMBICORT) 160-4.5 MCG/ACT inhaler    Sig: Inhale 2 puffs into the lungs 2 (two) times daily.    Dispense:  1 Inhaler    Refill:  12  . albuterol (PROVENTIL HFA;VENTOLIN HFA) 108 (90 Base) MCG/ACT inhaler    Sig: Inhale 2 puffs into the lungs every 6 (six) hours as needed for wheezing or shortness of breath (wheezing). For shortness of breath.    Dispense:  1 Inhaler    Refill:  3  . acetaminophen-codeine (TYLENOL #3) 300-30 MG tablet    Sig: Take 1 tablet by mouth every 6 (six) hours as needed for moderate pain.    Dispense:  90 tablet    Refill:  0  . metFORMIN (GLUCOPHAGE XR) 500 MG 24 hr tablet    Sig: Take 2 tablets (1,000 mg total) by mouth daily after supper.    Dispense:  60 tablet    Refill:  2    Follow-up: No Follow-up on file.   Dessa Phi MD

## 2015-12-29 NOTE — Assessment & Plan Note (Addendum)
A: chronic L ankle pain  P: Weight loss Tylenol #3 Diclofenac zanaflex

## 2015-12-29 NOTE — Assessment & Plan Note (Addendum)
A: chronic pain no injury, obese patient P: Tylenol #3 since tramadol had not helped Diclofenac zanaflex  Weight loss

## 2015-12-29 NOTE — Progress Notes (Signed)
C/C Possible URI  Vs Asthma  Productive cough and dry at times Pain scale #7 knee pain bilateral  No tobacco user  No suicidal thoughts in the past two weeks

## 2015-12-30 ENCOUNTER — Ambulatory Visit (HOSPITAL_COMMUNITY)
Admission: RE | Admit: 2015-12-30 | Discharge: 2015-12-30 | Disposition: A | Discharge status: Home / Self Care | Payer: Medicaid Other | Source: Ambulatory Visit | Attending: Family Medicine | Admitting: Family Medicine

## 2015-12-30 ENCOUNTER — Telehealth: Payer: Self-pay | Admitting: Family Medicine

## 2015-12-30 DIAGNOSIS — J4540 Moderate persistent asthma, uncomplicated: Secondary | ICD-10-CM | POA: Insufficient documentation

## 2015-12-30 NOTE — Telephone Encounter (Signed)
Pt. Called requesting her urine results that she had about two weeks ago.  Pt. Also would like her x-ray results that she had done yesterday. Pt. Was advised that her x-ray results would take up to 48 hours before  She could receive her results. Please f/u

## 2016-01-06 ENCOUNTER — Other Ambulatory Visit: Payer: Self-pay | Admitting: *Deleted

## 2016-01-06 DIAGNOSIS — J4540 Moderate persistent asthma, uncomplicated: Secondary | ICD-10-CM

## 2016-01-06 MED ORDER — ALBUTEROL SULFATE HFA 108 (90 BASE) MCG/ACT IN AERS: 2.0000 | INHALATION_SPRAY | Freq: Four times a day (QID) | RESPIRATORY_TRACT | Status: DC | PRN

## 2016-01-06 MED ORDER — BUDESONIDE-FORMOTEROL FUMARATE 160-4.5 MCG/ACT IN AERO: 2.0000 | INHALATION_SPRAY | Freq: Two times a day (BID) | RESPIRATORY_TRACT | Status: DC

## 2016-01-06 NOTE — Telephone Encounter (Signed)
PASS PROGRAM 

## 2016-01-06 NOTE — Telephone Encounter (Signed)
Date of birth verified by pt  Results given  chest x-ray is normal. There is no evidence of bronchitis or pneumonia.  Please continue the current care plan.  Pt stated reaction to metformin, leg swelling, sleeping to much  Advised to stop metformin. Requesting medication changes for Wt Loss

## 2016-01-07 NOTE — Telephone Encounter (Signed)
Please inform patient  Metformin removed from med list Patient referred to medical nutritionist

## 2016-01-09 ENCOUNTER — Ambulatory Visit (HOSPITAL_COMMUNITY)
Admission: EM | Admit: 2016-01-09 | Discharge: 2016-01-09 | Disposition: A | Discharge status: Home / Self Care | Payer: Medicaid Other | Attending: Family Medicine | Admitting: Family Medicine

## 2016-01-09 ENCOUNTER — Encounter (HOSPITAL_COMMUNITY): Payer: Self-pay | Admitting: Emergency Medicine

## 2016-01-09 DIAGNOSIS — S63502A Unspecified sprain of left wrist, initial encounter: Secondary | ICD-10-CM

## 2016-01-09 MED ORDER — TRAMADOL HCL 50 MG PO TABS: 50.0000 mg | ORAL_TABLET | Freq: Four times a day (QID) | ORAL | Status: DC | PRN

## 2016-01-09 NOTE — ED Provider Notes (Addendum)
CSN: [id number]     Arrival date & time 01/09/16  1928 History   First MD Initiated Contact with Patient 01/09/16 1956     Chief Complaint  Patient presents with  . Wrist Pain   (Consider location/radiation/quality/duration/timing/severity/associated sxs/prior Treatment) Patient is a 30 y.o. female presenting with wrist pain. The history is provided by the patient. No language interpreter was used.  Wrist Pain This is a new problem. The current episode started more than 2 days ago (started few days ago). The problem occurs constantly. The problem has been gradually worsening. Associated symptoms comments: Pain and swelling. She was picking up 40 lbs nephew and then she heard a pop in her left wrist. Since then it has been painful. Pain is about 10/10 in severity but improved some now to 8/10 in severity. She used Tylenol with codeine for her knee pain which did not help much.. Nothing aggravates the symptoms. Nothing relieves the symptoms.    Past Medical History  Diagnosis Date  . Asthma   . Obesity   . Seizures (HCC)   . Obesity   . Anxiety    Past Surgical History  Procedure Laterality Date  . Tonsillectomy     Family History  Problem Relation Age of Onset  . Hypothyroidism Mother   . Diabetes Father   . Diabetes Paternal Uncle   . Hypertension Maternal Grandmother   . Hypertension Maternal Grandfather   . Hypertension Paternal Grandmother   . Heart disease Paternal Grandfather   . Hypertension Paternal Grandfather    Social History  Substance Use Topics  . Smoking status: Never Smoker   . Smokeless tobacco: Never Used  . Alcohol Use: No   OB History    Gravida Para Term Preterm AB TAB SAB Ectopic Multiple Living   1    1  1    0     Review of Systems  Respiratory: Negative.   Cardiovascular: Negative.   Musculoskeletal: Positive for joint swelling and arthralgias.  All other systems reviewed and are negative.   Allergies  Bee venom; Cinnamon; and  Ibuprofen  Home Medications   Prior to Admission medications   Medication Sig Start Date End Date Taking? Authorizing Provider  acetaminophen-codeine (TYLENOL #3) 300-30 MG tablet Take 1 tablet by mouth every 6 (six) hours as needed for moderate pain. 12/29/15   Josalyn Funches, MD  albuterol (PROVENTIL HFA;VENTOLIN HFA) 108 (90 Base) MCG/ACT inhaler Inhale 2 puffs into the lungs every 6 (six) hours as needed for wheezing or shortness of breath (wheezing). For shortness of breath. 01/06/16   01/08/16, MD  budesonide-formoterol [facility]) 160-4.5 MCG/ACT inhaler Inhale 2 puffs into the lungs 2 (two) times daily. 01/06/16   01/08/16, MD  cetirizine (ZYRTEC) 10 MG tablet Take 1 tablet (10 mg total) by mouth daily. 12/29/15   12/31/15, MD  diclofenac (VOLTAREN) 75 MG EC tablet Take 1 tablet (75 mg total) by mouth 2 (two) times daily. Take with food 12/29/15   12/31/15, MD  fluticasone (FLONASE) 50 MCG/ACT nasal spray Place 2 sprays into both nostrils daily. 12/29/15   Josalyn Funches, MD  omeprazole (PRILOSEC) 20 MG capsule Take 1 capsule (20 mg total) by mouth daily. 12/29/15   Josalyn Funches, MD  tiZANidine (ZANAFLEX) 4 MG tablet Take 1 tablet (4 mg total) by mouth every 6 (six) hours as needed for muscle spasms. 12/29/15   12/31/15, MD   Meds Ordered and Administered this Visit  Medications - No  data to display  BP 162/91 mmHg  Pulse 95  Temp(Src) 97.9 F (36.6 C) (Oral)  Resp 20  SpO2 100%  LMP 12/11/2015 No data found.   Physical Exam  Constitutional: She appears well-developed. No distress.  Cardiovascular: Normal rate, regular rhythm and normal heart sounds.   No murmur heard. Pulses:      Radial pulses are 2+ on the right side, and 2+ on the left side.  Pulmonary/Chest: Effort normal and breath sounds normal. No respiratory distress. She has no wheezes.  Musculoskeletal:       Right wrist: Normal.       Left wrist: She exhibits decreased range  of motion, tenderness and swelling. She exhibits no crepitus, no deformity and no laceration.       Arms: Neurological: She has normal strength. No cranial nerve deficit or sensory deficit.  Nursing note and vitals reviewed.   ED Course  Procedures (including critical care time)  Labs Review Labs Reviewed - No data to display  Imaging Review No results found.   Visual Acuity Review  Right Eye Distance:   Left Eye Distance:   Bilateral Distance:    Right Eye Near:   Left Eye Near:    Bilateral Near:         MDM  No diagnosis found. Wrist sprain, left, initial encounter  As discussed with patient risk of fracture or dislocation is low given nature of injury. Plan to treat conservatively with wrist brace and pain medicine. Tramadol prescribed. Warm compression recommended. Patient instructed to go to the ED if symptoms worsens or persist in the next 2-3 days. I will also give a follow up call tomorrow to ensure she is feeling better. She agreed with plan.       Doreene Eland, MD 01/09/16 2051

## 2016-01-09 NOTE — Discharge Instructions (Signed)
It was nice seeing today. i am sorry about your wrist pain. This is likely due to a sprain. Fracture unlikely due to nature of injury. I will recommend conservative measures with immobilizer and pain meds. If pain persist with swelling please go to the hospital for an xray. Wrist Sprain A wrist sprain is a stretch or tear in the strong, fibrous tissues (ligaments) that connect your wrist bones. The ligaments of your wrist may be easily sprained. There are three types of wrist sprains.  Grade 1. The ligament is not stretched or torn, but the sprain causes pain.  Grade 2. The ligament is stretched or partially torn. You may be able to move your wrist, but not very much.  Grade 3. The ligament or muscle completely tears. You may find it difficult or extremely painful to move your wrist even a little. CAUSES Often, wrist sprains are a result of a fall or an injury. The force of the impact causes the fibers of your ligament to stretch too much or tear. Common causes of wrist sprains include:  Overextending your wrist while catching a ball with your hands.  Repetitive or strenuous extension or bending of your wrist.  Landing on your hand during a fall. RISK FACTORS  Having previous wrist injuries.  Playing contact sports, such as boxing or wrestling.  Participating in activities in which falling is common.  Having poor wrist strength and flexibility. SIGNS AND SYMPTOMS  Wrist pain.  Wrist tenderness.  Inflammation or bruising of the wrist area.  Hearing a "pop" or feeling a tear at the time of the injury.  Decreased wrist movement due to pain, stiffness, or weakness. DIAGNOSIS Your health care provider will examine your wrist. In some cases, an X-ray will be taken to make sure you did not break any bones. If your health care provider thinks that you tore a ligament, he or she may order an MRI of your wrist. TREATMENT Treatment involves resting and icing your wrist. You may also need  to take pain medicines to help lessen pain and inflammation. Your health care provider may recommend keeping your wrist still (immobilized) with a splint to help your sprain heal. When the splint is no longer necessary, you may need to perform strengthening and stretching exercises. These exercises help you to regain strength and full range of motion in your wrist. Surgery is not usually needed for wrist sprains unless the ligament completely tears. HOME CARE INSTRUCTIONS  Rest your wrist. Do not do things that cause pain.  Wear your wrist splint as directed by your health care provider.  Take medicines only as directed by your health care provider.  To ease pain and swelling, apply ice to the injured area.  Put ice in a plastic bag.  Place a towel between your skin and the bag.  Leave the ice on for 20 minutes, 2-3 times a day. SEEK MEDICAL CARE IF:  Your pain, discomfort, or swelling gets worse even with treatment.  You feel sudden numbness in your hand.   This information is not intended to replace advice given to you by your health care provider. Make sure you discuss any questions you have with your health care provider.   Document Released: 03/07/2014 Document Reviewed: 03/07/2014 Elsevier Interactive Patient Education [employer]

## 2016-01-09 NOTE — ED Notes (Signed)
Patient c/o wrist pain following picking up a toddler who weighed about 40 lbs earlier this week. She reports that she still has pain and it has not gotten better. Patient is in NAD.

## 2016-01-10 ENCOUNTER — Telehealth: Payer: Self-pay | Admitting: Family Medicine

## 2016-01-10 DIAGNOSIS — S6992XA Unspecified injury of left wrist, hand and finger(s), initial encounter: Secondary | ICD-10-CM

## 2016-01-10 NOTE — Telephone Encounter (Signed)
I called to follow up with patient as planned, she stated her wrist is no better and she will like to proceed with xray. Xray ordered. She will go to radiology tomorrow for xray and I will contact her with result and plan. In the meantime continue pain meds as instructed as well as wrist brace. F/U with PCP soon.

## 2016-01-11 ENCOUNTER — Ambulatory Visit (HOSPITAL_COMMUNITY)
Admission: RE | Admit: 2016-01-11 | Discharge: 2016-01-11 | Disposition: A | Discharge status: Home / Self Care | Payer: Medicaid Other | Source: Ambulatory Visit | Attending: Family Medicine | Admitting: Family Medicine

## 2016-01-11 DIAGNOSIS — M25532 Pain in left wrist: Secondary | ICD-10-CM | POA: Insufficient documentation

## 2016-01-11 DIAGNOSIS — S6992XA Unspecified injury of left wrist, hand and finger(s), initial encounter: Secondary | ICD-10-CM

## 2016-01-11 DIAGNOSIS — X58XXXA Exposure to other specified factors, initial encounter: Secondary | ICD-10-CM | POA: Insufficient documentation

## 2016-01-11 DIAGNOSIS — M7989 Other specified soft tissue disorders: Secondary | ICD-10-CM | POA: Insufficient documentation

## 2016-01-11 NOTE — Telephone Encounter (Signed)
I contacted patient today about her xray report but she did not pick up and i was unable to leave a message.   Xray essentially normal. If pain persist she will need an MRI of her wrist. I will forward note to her PCP to follow up with her.

## 2016-01-11 NOTE — Telephone Encounter (Signed)
Called pt. Pt verified name and date of birth. Pt notified that metformin has been removed from her med list and she was referred to medical nutritionist. Pt voiced understanding and stated that she still has swelling in her legs. Pt advised to make an appt to be assessed. Pt transferred to the front desk to make an appt.

## 2016-01-11 NOTE — Telephone Encounter (Signed)
Called pt. Reached voicemail. Left message to return call at [phone]. 

## 2016-01-11 NOTE — Telephone Encounter (Signed)
I called patient back. Message left to call back soon.

## 2016-01-11 NOTE — Telephone Encounter (Signed)
Pt. Returned call. Please f/u with pt. °

## 2016-01-12 ENCOUNTER — Encounter: Payer: Self-pay | Admitting: Family Medicine

## 2016-01-12 ENCOUNTER — Telehealth: Payer: Self-pay | Admitting: Family Medicine

## 2016-01-12 ENCOUNTER — Ambulatory Visit: Payer: Self-pay | Attending: Family Medicine | Admitting: Family Medicine

## 2016-01-12 VITALS — BP 138/76 | HR 96 | Temp 98.5°F | Resp 12 | Ht 64.0 in | Wt 344.6 lb

## 2016-01-12 DIAGNOSIS — R635 Abnormal weight gain: Secondary | ICD-10-CM | POA: Insufficient documentation

## 2016-01-12 DIAGNOSIS — M25532 Pain in left wrist: Secondary | ICD-10-CM

## 2016-01-12 MED ORDER — LORAZEPAM 0.5 MG PO TABS: 0.5000 mg | ORAL_TABLET | Freq: Two times a day (BID) | ORAL | Status: DC | PRN

## 2016-01-12 NOTE — Telephone Encounter (Signed)
Attempted to call patient. There was no answer and unable to leave VM. Called patient to f/u her discussion with Otto Herb, NT following her OV today.   Patient with morbid obesity she was intolerant of metformin prescribed at last OV for weight loss stating it caused swelling. She reports that working with a nutritionist has not worked for her in the past.  She believes she is retaining fluid and would like a diuretic. She request a different weight loss medication. She is not willing to have blood work done stating that she cannot tolerate needles and would have syncope and seizures.   Plan:  Labs CMP, CBC, screening HIV With plan for trial of diuretic if she had normal renal function.  Low salt diet Advised against taking OTC potassium as she is unlikely to be deficient and I cannot recommend a dose since I do not know her level.   Patient is clearly not satisfied with the care I offered.  I called patient to offer that she transition to a different PCP in the office who may be better able to address her concerns.   Jamilla, please follow up with patient regarding a PCP switch.

## 2016-01-12 NOTE — Progress Notes (Signed)
Subjective:  Patient ID: Courtney Zamora, female    DOB: [birth year]  Age: 30 y.o. MRN: [id number]  CC: Obesity and Wrist Pain   HPI Courtney Zamora presents for   1. UC f/u L wrist pain: she was evaluated in the [facility] on 01/09/16 for L radial wrist pain. She reports pain started after lifting her niece and was exacerbated by her husband pulling her up by her hands from a low chair. She had negative wrist x-ray. She continues to have pain and radial wrist swelling. Pain is exacerbated by radial deviation of the wrist. She is R handed. She has hx of R>L hand pains and numbness that was attributed to carpal tunnel syndrome in the past.   2. Morbid obesity:  She has been obese most of her life. She has tried dieting without success. She denies excessive appetite. She is having trouble conceiving with one first trimester miscarriage in the past 3 years. She denies hx of diabetes. She tried metformin for one week for weight loss following the last OV. She reports that she developed swelling in her legs and excessive fatigue. She was advised to stop metformin. She request other weight loss medication options. She request diuretic for fluid removal. 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 She is not able to inject herself and refuses blood work to assess for possibly contributing endocrine disorder.  She does not exercise. She reports that working with a nutritionist will not help as she has tried this in the past without improvement.    Social History  Substance Use Topics  . Smoking status: Never Smoker   . Smokeless tobacco: Never Used  . Alcohol Use: No    Outpatient Prescriptions Prior to Visit  Medication Sig Dispense Refill  . acetaminophen-codeine (TYLENOL #3) 300-30 MG tablet Take 1 tablet by mouth every 6 (six) hours as needed for moderate pain. 90 tablet 0  . albuterol (PROVENTIL HFA;VENTOLIN HFA) 108 (90 Base) MCG/ACT inhaler Inhale 2 puffs into the lungs every 6 (six) hours as  needed for wheezing or shortness of breath (wheezing). For shortness of breath. 54 Inhaler 3  . budesonide-formoterol (SYMBICORT) 160-4.5 MCG/ACT inhaler Inhale 2 puffs into the lungs 2 (two) times daily. 3 Inhaler 3  . cetirizine (ZYRTEC) 10 MG tablet Take 1 tablet (10 mg total) by mouth daily. 30 tablet 11  . diclofenac (VOLTAREN) 75 MG EC tablet Take 1 tablet (75 mg total) by mouth 2 (two) times daily. Take with food 180 tablet 1  . fluticasone (FLONASE) 50 MCG/ACT nasal spray Place 2 sprays into both nostrils daily. 16 g 6  . omeprazole (PRILOSEC) 20 MG capsule Take 1 capsule (20 mg total) by mouth daily. 30 capsule 3  . tiZANidine (ZANAFLEX) 4 MG tablet Take 1 tablet (4 mg total) by mouth every 6 (six) hours as needed for muscle spasms. 120 tablet 2  . traMADol (ULTRAM) 50 MG tablet Take 1 tablet (50 mg total) by mouth every 6 (six) hours as needed. (Patient not taking: Reported on 01/12/2016) 15 tablet 0   No facility-administered medications prior to visit.    ROS Review of Systems  Constitutional: Positive for fatigue. Negative for fever.  HENT: Negative for ear pain, postnasal drip, rhinorrhea, sinus pressure, sneezing, sore throat and trouble swallowing.   Respiratory: Negative for cough, chest tightness, shortness of breath and wheezing.   Cardiovascular: Positive for leg swelling. Negative for chest pain.  Gastrointestinal:       Genella Rife  Musculoskeletal: Positive for arthralgias (chronic knee pain ).  Neurological: Negative for headaches.    Objective:  BP 138/76 mmHg  Pulse 96  Temp(Src) 98.5 F (36.9 C) (Oral)  Resp 12  Ht 5\' 4"  (1.626 m)  Wt 344 lb 9.6 oz (156.31 kg)  BMI 59.12 kg/m2  SpO2 98%  LMP 12/11/2015  BP/Weight 01/12/2016 01/09/2016 12/29/2015  Systolic BP 138 162 136  Diastolic BP 76 91 80  Wt. (Lbs) 344.6 - 334  BMI 59.12 - 57.3   BP Readings from Last 3 Encounters:  01/12/16 138/76  01/09/16 162/91  12/29/15 136/80   Pulse Readings from Last 3  Encounters:  01/12/16 96  01/09/16 95  12/29/15 71    Physical Exam  Constitutional: She is oriented to person, place, and time. She appears well-developed and well-nourished. No distress.  Morbidly obese   HENT:  Head: Normocephalic and atraumatic.  Cardiovascular: Intact distal pulses.   Pulmonary/Chest: Effort normal.  Musculoskeletal: She exhibits no edema.       Left wrist: She exhibits decreased range of motion, tenderness and swelling. She exhibits no bony tenderness, no effusion, no crepitus and no deformity.  Radial wrist swelling and tenderness with radial deviation   Neurological: She is alert and oriented to person, place, and time.  Skin: Skin is warm and dry. No rash noted.  Psychiatric: She has a normal mood and affect.   D wrist 01/11/16  CLINICAL DATA: Left distal radial pain and swelling for the past week after lifting a heavy child.  EXAM: LEFT WRIST - COMPLETE 3+ VIEW  COMPARISON: Left hand series of May 07, 2013  FINDINGS: The bones are reasonably well mineralized for age. No acute fracture or dislocation is observed. The joint spaces are preserved. The soft tissues exhibit no acute abnormalities. Specific attention to the first carpometacarpal joint reveals no abnormality.  IMPRESSION: There is no acute bony abnormality of the left wrist. If the patient's symptoms persist, MRI of the wrist may be a useful next imaging step.    Assessment & Plan:   There are no diagnoses linked to this encounter. Courtney Zamora was seen today for obesity and wrist pain.  Diagnoses and all orders for this visit:  Wrist pain, acute, left -     MR Wrist Left Wo Contrast; Future -     LORazepam (ATIVAN) 0.5 MG tablet; Take 1-2 tablets (0.5-1 mg total) by mouth 2 (two) times daily as needed for sedation (prior to MRI).  Weight gain -     COMPLETE METABOLIC PANEL WITH GFR; Future -     CBC; Future -     Hemoglobin A1c; Future   Meds ordered this encounter    Medications  . LORazepam (ATIVAN) 0.5 MG tablet    Sig: Take 1-2 tablets (0.5-1 mg total) by mouth 2 (two) times daily as needed for sedation (prior to MRI).    Dispense:  2 tablet    Refill:  0    Follow-up: Return in about 6 weeks (around 02/23/2016) for L wrist pain .   04/24/2016 MD

## 2016-01-12 NOTE — Telephone Encounter (Signed)
Called pt. Pt verified name and date of birth. Pt wanted to know what amount of potassium is safe to take. Pt notified the PCP does not suggest taking potassium supplement unknowing of her kidney function. Pt expressed that she does not think she has any kidney issues. Pt notified that lab orders were placed to check her kidney function. Pt stated that she is aware of that, but nobody will knock her out in order to draw labs because she has a needle phobia. Will f/u with pt on further options.

## 2016-01-12 NOTE — Assessment & Plan Note (Signed)
Morbid obesity with recent weight gain  Plan: CMP, CBC to assess for anemia and renal dysfunction  Consider trial of diuretic pending results

## 2016-01-12 NOTE — Progress Notes (Signed)
Medication side effect Was put on metformin and started having side effects of swelling feet Taking medication as prescribed Pain scale today in the office is a 8 Pt is having pain in left arm to the wrist Went to the urgent care and had x-rays done No suicidal thoughts in the past 2 weeks

## 2016-01-12 NOTE — Assessment & Plan Note (Signed)
L radial wrist pain following lifting and pulling injury with persistent swelling  Plan Continue brace MRI to evaluate soft tissue  Continue tylenol #3, zanaflex and voltaren all of which she take for chronic pain Also tramadol which was prescribed for this acute injury.

## 2016-01-12 NOTE — Patient Instructions (Addendum)
Diagnoses and all orders for this visit:  Wrist pain, acute, left -     MR Wrist Left Wo Contrast; Future   F/u in 6 weeks for L  wrist pain  You weight is up. A diuretic would help remove fluid but lab work is needed prior to starting one. I have ordered the necessary labs as a future order if you decide to try having labs drawn please schedule a lab visit.    Dr. Armen Pickup   Low-Sodium Eating Plan Sodium raises blood pressure and causes water to be held in the body. Getting less sodium from food will help lower your blood pressure, reduce any swelling, and protect your heart, liver, and kidneys. We get sodium by adding salt (sodium chloride) to food. Most of our sodium comes from canned, boxed, and frozen foods. Restaurant foods, fast foods, and pizza are also very high in sodium. Even if you take medicine to lower your blood pressure or to reduce fluid in your body, getting less sodium from your food is important. WHAT IS MY PLAN? Most people should limit their sodium intake to 2,300 mg a day. Your health care provider recommends that you limit your sodium intake to __less than __2000 mg______ a day.  WHAT DO I NEED TO KNOW ABOUT THIS EATING PLAN? For the low-sodium eating plan, you will follow these general guidelines:  Choose foods with a % Daily Value for sodium of less than 5% (as listed on the food label).   Use salt-free seasonings or herbs instead of table salt or sea salt.   Check with your health care provider or pharmacist before using salt substitutes.   Eat fresh foods.  Eat more vegetables and fruits.  Limit canned vegetables. If you do use them, rinse them well to decrease the sodium.   Limit cheese to 1 oz (28 g) per day.   Eat lower-sodium products, often labeled as "lower sodium" or "no salt added."  Avoid foods that contain monosodium glutamate (MSG). MSG is sometimes added to [place] food and some canned foods.  Check food labels (Nutrition Facts  labels) on foods to learn how much sodium is in one serving.  Eat more home-cooked food and less restaurant, buffet, and fast food.  When eating at a restaurant, ask that your food be prepared with less salt, or no salt if possible.  HOW DO I READ FOOD LABELS FOR SODIUM INFORMATION? The Nutrition Facts label lists the amount of sodium in one serving of the food. If you eat more than one serving, you must multiply the listed amount of sodium by the number of servings. Food labels may also identify foods as:  Sodium free--Less than 5 mg in a serving.  Very low sodium--35 mg or less in a serving.  Low sodium--140 mg or less in a serving.  Light in sodium--50% less sodium in a serving. For example, if a food that usually has 300 mg of sodium is changed to become light in sodium, it will have 150 mg of sodium.  Reduced sodium--25% less sodium in a serving. For example, if a food that usually has 400 mg of sodium is changed to reduced sodium, it will have 300 mg of sodium. WHAT FOODS CAN I EAT? Grains Low-sodium cereals, including oats, puffed wheat and rice, and shredded wheat cereals. Low-sodium crackers. Unsalted rice and pasta. Lower-sodium bread.  Vegetables Frozen or fresh vegetables. Low-sodium or reduced-sodium canned vegetables. Low-sodium or reduced-sodium tomato sauce and paste. Low-sodium or reduced-sodium tomato  and vegetable juices.  Fruits Fresh, frozen, and canned fruit. Fruit juice.  Meat and Other Protein Products Low-sodium canned tuna and salmon. Fresh or frozen meat, poultry, seafood, and fish. Lamb. Unsalted nuts. Dried beans, peas, and lentils without added salt. Unsalted canned beans. Homemade soups without salt. Eggs.  Dairy Milk. Soy milk. Ricotta cheese. Low-sodium or reduced-sodium cheeses. Yogurt.  Condiments Fresh and dried herbs and spices. Salt-free seasonings. Onion and garlic powders. Low-sodium varieties of mustard and ketchup. Fresh or refrigerated  horseradish. Lemon juice.  Fats and Oils Reduced-sodium salad dressings. Unsalted butter.  Other Unsalted popcorn and pretzels.  The items listed above may not be a complete list of recommended foods or beverages. Contact your dietitian for more options. WHAT FOODS ARE NOT RECOMMENDED? Grains Instant hot cereals. Bread stuffing, pancake, and biscuit mixes. Croutons. Seasoned rice or pasta mixes. Noodle soup cups. Boxed or frozen macaroni and cheese. Self-rising flour. Regular salted crackers. Vegetables Regular canned vegetables. Regular canned tomato sauce and paste. Regular tomato and vegetable juices. Frozen vegetables in sauces. Salted [place] fries. Olives. Rosita Fire. Relishes. Sauerkraut. Salsa. Meat and Other Protein Products Salted, canned, smoked, spiced, or pickled meats, seafood, or fish. Bacon, ham, sausage, hot dogs, corned beef, chipped beef, and packaged luncheon meats. Salt pork. Jerky. Pickled herring. Anchovies, regular canned tuna, and sardines. Salted nuts. Dairy Processed cheese and cheese spreads. Cheese curds. Blue cheese and cottage cheese. Buttermilk.  Condiments Onion and garlic salt, seasoned salt, table salt, and sea salt. Canned and packaged gravies. Worcestershire sauce. Tartar sauce. Barbecue sauce. Teriyaki sauce. Soy sauce, including reduced sodium. Steak sauce. Fish sauce. Oyster sauce. Cocktail sauce. Horseradish that you find on the shelf. Regular ketchup and mustard. Meat flavorings and tenderizers. Bouillon cubes. Hot sauce. Tabasco sauce. Marinades. Taco seasonings. Relishes. Fats and Oils Regular salad dressings. Salted butter. Margarine. Ghee. Bacon fat.  Other Potato and tortilla chips. Corn chips and puffs. Salted popcorn and pretzels. Canned or dried soups. Pizza. Frozen entrees and pot pies.  The items listed above may not be a complete list of foods and beverages to avoid. Contact your dietitian for more information.   This information is  not intended to replace advice given to you by your health care provider. Make sure you discuss any questions you have with your health care provider.   Document Released: 12/24/2001 Document Revised: 07/25/2014 Document Reviewed: 05/08/2013 Elsevier Interactive Patient Education [employer]

## 2016-01-18 ENCOUNTER — Ambulatory Visit (HOSPITAL_COMMUNITY): Payer: Medicaid Other

## 2016-01-18 ENCOUNTER — Ambulatory Visit (HOSPITAL_COMMUNITY)
Admission: RE | Admit: 2016-01-18 | Discharge: 2016-01-18 | Disposition: A | Discharge status: Home / Self Care | Payer: Medicaid Other | Source: Ambulatory Visit | Attending: Family Medicine | Admitting: Family Medicine

## 2016-01-18 DIAGNOSIS — M25532 Pain in left wrist: Secondary | ICD-10-CM

## 2016-01-18 NOTE — Progress Notes (Signed)
Pt came in as an OP and had taken PO sedation for MRI of her left wrist.  Pt was put in our larger 3T scanner and pt could not tolerate it and asked to come out and abort exam.  Pt stated she requires an Open scanner which we do not have here at [facility].  Pt got dressed and was went home.

## 2016-01-20 NOTE — Telephone Encounter (Signed)
Pt. Called stating she was suppose to have an MRI on Monday but Since she is claustrophobic the medication she takes did not help  Her and she was not able to have the MRI done. Please f/u with pt.

## 2016-01-21 ENCOUNTER — Ambulatory Visit (HOSPITAL_COMMUNITY)
Admission: EM | Admit: 2016-01-21 | Discharge: 2016-01-21 | Disposition: A | Discharge status: Home / Self Care | Payer: Medicaid Other | Attending: Emergency Medicine | Admitting: Emergency Medicine

## 2016-01-21 ENCOUNTER — Encounter (HOSPITAL_COMMUNITY): Payer: Self-pay | Admitting: Emergency Medicine

## 2016-01-21 DIAGNOSIS — L02211 Cutaneous abscess of abdominal wall: Secondary | ICD-10-CM

## 2016-01-21 DIAGNOSIS — Z8614 Personal history of Methicillin resistant Staphylococcus aureus infection: Secondary | ICD-10-CM

## 2016-01-21 MED ORDER — DOXYCYCLINE HYCLATE 100 MG PO CAPS: 100.0000 mg | ORAL_CAPSULE | Freq: Two times a day (BID) | ORAL | Status: DC

## 2016-01-21 NOTE — Telephone Encounter (Signed)
Please inform patient that we will plan to wait as it has only been about two weeks since her wrist injury. If pain improves she will not need MRI.   Continue brace, ice, and pain medicine

## 2016-01-21 NOTE — Discharge Instructions (Signed)
Abscess An abscess (boil or furuncle) is an infected area on or under the skin. This area is filled with yellowish-white fluid (pus) and other material (debris). HOME CARE   Only take medicines as told by your doctor.  If you were given antibiotic medicine, take it as directed. Finish the medicine even if you start to feel better.  If gauze is used, follow your doctor's directions for changing the gauze.  To avoid spreading the infection:  Keep your abscess covered with a bandage.  Wash your hands well.  Do not share personal care items, towels, or whirlpools with others.  Avoid skin contact with others.  Keep your skin and clothes clean around the abscess.  Keep all doctor visits as told. GET HELP RIGHT AWAY IF:   You have more pain, puffiness (swelling), or redness in the wound site.  You have more fluid or blood coming from the wound site.  You have muscle aches, chills, or you feel sick.  You have a fever. MAKE SURE YOU:   Understand these instructions.  Will watch your condition.  Will get help right away if you are not doing well or get worse.   This information is not intended to replace advice given to you by your health care provider. Make sure you discuss any questions you have with your health care provider.   Document Released: 12/21/2007 Document Revised: 01/03/2012 Document Reviewed: 09/17/2011 Elsevier Interactive Patient Education 2016 [employer].  MRSA FAQs What is MRSA? Staphylococcus aureus (pronounced staff-ill-oh-KOK-us AW-ree-us), or "Staph" is a very common germ that about 1 out of every 3 people have on their skin or in their nose. This germ does not cause any problems for most people who have it on their skin. But sometimes it can cause serious infections such as skin or wound infections, pneumonia, or infections of the blood.  Antibiotics are given to kill Staph germs when they cause infections. Some Staph are resistant, meaning they cannot  be killed by some antibiotics. "Methicillin-resistant Staphylococcus aureus" or "MRSA" is a type of Staph that is resistant to some of the antibiotics that are often used to treat Staph infections. Who is most likely to get an MRSA infection? In the hospital, people who are more likely to get an MRSA infection are people who:  have other health conditions making them sick  have been in the hospital or a nursing home  have been treated with antibiotics. People who are healthy and who have not been in the hospital or a nursing home can also get MRSA infections. These infections usually involve the skin. More information about this type of MRSA infection, known as "community-associated MRSA" infection, is available from the Centers for Disease Control and Prevention (CDC). ReportNation.uy How do I get an MRSA infection? People who have MRSA germs on their skin or who are infected with MRSA may be able to spread the germ to other people. MRSA can be passed on to bed linens, bed rails, bathroom fixtures, and medical equipment. It can spread to other people on contaminated equipment and on the hands of doctors, nurses, other healthcare providers and visitors. Can MRSA infections be treated? Yes, there are antibiotics that can kill MRSA germs. Some patients with MRSA abscesses may need surgery to drain the infection. Your healthcare provider will determine which treatments are best for you. What are some of the things that hospitals are doing to prevent MRSA infections? To prevent MRSA infections, doctors, nurses and other healthcare providers:  Clean their hands with soap and water or an alcohol-based hand rub before and after caring for every patient.  Carefully clean hospital rooms and medical equipment.  Use Contact Precautions when caring for patients with MRSA. Contact Precautions mean:  Whenever possible, patients with MRSA will have a single room or will share a room only with someone  else who also has MRSA.  Healthcare providers will put on gloves and wear a gown over their clothing while taking care of patients with MRSA.  Visitors may also be asked to wear a gown and gloves.  When leaving the room, hospital providers and visitors remove their gown and gloves and clean their hands.  Patients on Contact Precautions are asked to stay in their hospital rooms as much as possible. They should not go to common areas, such as the gift shop or cafeteria. They may go to other areas of the hospital for treatments and tests.  May test some patients to see if they have MRSA on their skin. This test involves rubbing a cotton-tipped swab in the patient's nostrils or on the skin. What can I do to help prevent MRSA infections? In the hospital  Make sure that all doctors, nurses, and other healthcare providers clean their hands with soap and water or an alcohol-based hand rub before and after caring for you. If you do not see your providers clean their hands, please ask them to do so. When you go home  If you have wounds or an intravascular device (such as a catheter or dialysis port) make sure that you know how to take care of them. Can my friends and family get MRSA when they visit me? The chance of getting MRSA while visiting a person who has MRSA is very low. To decrease the chance of getting MRSA your family and friends should:  Clean their hands before they enter your room and when they leave.  Ask a healthcare provider if they need to wear protective gowns and gloves when they visit you. What do I need to do when I go home from the hospital? To prevent another MRSA infection and to prevent spreading MRSA to others:  Keep taking any antibiotics prescribed by your doctor. Don't take half-doses or stop before you complete your prescribed course.  Clean your hands often, especially before and after changing your wound dressing or bandage.  People who live with you should clean  their hands often as well.  Keep any wounds clean and change bandages as instructed until healed.  Avoid sharing personal items such as towels or razors.  Wash and dry your clothes and bed linens in the warmest temperatures recommended on the labels.  Tell your healthcare providers that you have MRSA. This includes home health nurses and aides, therapists, and personnel in doctors' offices.  Your doctor may have more instructions for you. If you have questions, please ask your doctor or nurse. Developed and co-sponsored by [employer] for [employer] of [place] [id number]); Infectious Diseases Society of America (IDSA); [facility]; Association for Professionals in Infection Control and Epidemiology (APIC); Centers for Disease Control and Prevention (CDC); and [employer]   This information is not intended to replace advice given to you by your health care provider. Make sure you discuss any questions you have with your health care provider.   Document Released: 07/09/2013 Document Reviewed: 09/17/2014 Elsevier Interactive Patient Education [employer]

## 2016-01-21 NOTE — Telephone Encounter (Signed)
Called pt. Pt verified name and date of birth. Pt notified that we will plan to wait on her MRI since it has only been two weeks since her wrist injury. Pt notified to continue brace, ice, and pain medicine and if the pain improves an MRI will not be needed but if the pain does not improve after two weeks, we will continue with the MRI. Pt also notified that carpel tunnel evaluation will be done during follow-up visit. Pt voiced understanding.

## 2016-01-21 NOTE — ED Notes (Signed)
Here for 2 abscess on abd onset Tuesday and and the other showed up today States one of them has drained "bloody/oily" substance associated w/pain (5/10) Hx of MRSA A&O x4... NAD

## 2016-01-26 ENCOUNTER — Other Ambulatory Visit: Payer: Self-pay | Admitting: Family Medicine

## 2016-01-26 DIAGNOSIS — M25572 Pain in left ankle and joints of left foot: Secondary | ICD-10-CM

## 2016-01-26 DIAGNOSIS — G8929 Other chronic pain: Secondary | ICD-10-CM

## 2016-01-26 DIAGNOSIS — M25561 Pain in right knee: Principal | ICD-10-CM

## 2016-01-26 NOTE — Telephone Encounter (Signed)
Tylenol #3 refill ready for pick up Tramadol refill refused Patient takes tylenol #3 for chronic pain

## 2016-01-26 NOTE — Telephone Encounter (Signed)
Pt notified Rx Tylenol #3 at front office ready to be pick up Bring picture ID when picking up Rx Tramadol refills refused by PCP, pt aware

## 2016-02-03 ENCOUNTER — Ambulatory Visit: Payer: Self-pay | Admitting: Dietician

## 2016-02-18 ENCOUNTER — Ambulatory Visit: Payer: Self-pay | Admitting: Dietician

## 2016-03-01 ENCOUNTER — Telehealth: Payer: Self-pay | Admitting: Family Medicine

## 2016-03-01 DIAGNOSIS — G8929 Other chronic pain: Secondary | ICD-10-CM

## 2016-03-01 DIAGNOSIS — M25561 Pain in right knee: Principal | ICD-10-CM

## 2016-03-01 DIAGNOSIS — M25572 Pain in left ankle and joints of left foot: Secondary | ICD-10-CM

## 2016-03-01 NOTE — Telephone Encounter (Signed)
Pt called requesting medication refill on acetaminophen-codeine (TYLENOL #3) 300-30 MG tablet °Please f/up  °

## 2016-03-02 ENCOUNTER — Encounter: Payer: Self-pay | Admitting: Critical Care Medicine

## 2016-03-02 ENCOUNTER — Ambulatory Visit: Payer: Self-pay | Attending: Critical Care Medicine | Admitting: Critical Care Medicine

## 2016-03-02 VITALS — BP 140/79 | HR 100 | Temp 98.4°F | Resp 20 | Ht 64.0 in | Wt 332.6 lb

## 2016-03-02 DIAGNOSIS — N926 Irregular menstruation, unspecified: Secondary | ICD-10-CM

## 2016-03-02 DIAGNOSIS — M25561 Pain in right knee: Secondary | ICD-10-CM

## 2016-03-02 DIAGNOSIS — R0982 Postnasal drip: Secondary | ICD-10-CM

## 2016-03-02 DIAGNOSIS — J4541 Moderate persistent asthma with (acute) exacerbation: Secondary | ICD-10-CM

## 2016-03-02 DIAGNOSIS — J309 Allergic rhinitis, unspecified: Secondary | ICD-10-CM

## 2016-03-02 DIAGNOSIS — J45909 Unspecified asthma, uncomplicated: Secondary | ICD-10-CM

## 2016-03-02 DIAGNOSIS — G8929 Other chronic pain: Secondary | ICD-10-CM

## 2016-03-02 MED ORDER — PREDNISONE 10 MG PO TABS: ORAL_TABLET | ORAL | 0 refills | Status: DC

## 2016-03-02 MED FILL — predniSONE 10 MG TABS: 10 | 9 days supply | Qty: 30 | Fill #0

## 2016-03-02 NOTE — Progress Notes (Signed)
Patient is here for FU  Patient complains of bilateral knee pain being present. Pain is scaled currently at an 8.  Patient has not taken medication today and patient has not eaten.  Patient denies any suicidal ideations at this time.

## 2016-03-02 NOTE — Patient Instructions (Addendum)
Take prednisone 10mg  Take 4 for three days 3 for three days 2 for three days 1 for three days and stop (sent to [facility] at [facility]) No change in symbicort and zyrtec and flonase Albuterol as needed Return 6 months I will ask your PCP to consider an orthopedic referral for knee pain

## 2016-03-02 NOTE — Progress Notes (Signed)
Subjective:    Patient ID: Courtney Zamora, female    DOB: May 23, 1986, 30 y.o.   MRN: [id number]  F/u of  Lifelong asthma   Today c/o knee pain issues.  Lately dyspnea is fair, worse on hot days Zyrtec helped some    Asthma  She complains of chest tightness, frequent throat clearing, shortness of breath, sputum production and wheezing. There is no cough, difficulty breathing, hemoptysis or hoarse voice. Primary symptoms comments: Mucus is clear to white Dyspnea is better Postnasal drip, sinus issues. This is a chronic problem. The current episode started more than 1 year ago. The problem occurs every several days (worse if active, ok at rest , qhs dyspnea two times per week). The problem has been gradually improving. The cough is productive of sputum (clear ). Associated symptoms include dyspnea on exertion, ear pain, headaches, heartburn, nasal congestion, orthopnea, postnasal drip, rhinorrhea, sneezing and a sore throat. Pertinent negatives include no chest pain, ear congestion, fever or trouble swallowing. Associated symptoms comments: Chest tightness. Her symptoms are aggravated by emotional stress, exercise, any activity, change in weather, exposure to fumes, exposure to smoke, pollen, occupational exposure and strenuous activity. Her symptoms are alleviated by beta-agonist. She reports significant improvement on treatment. Risk factors for lung disease include occupational exposure. Her past medical history is significant for asthma. There is no history of bronchiectasis, bronchitis, emphysema or pneumonia. Past medical history comments: No skin testing ,  Pollen allergies, lysol issues, fumes are an issue.  c/o dysuria PUL ASTHMA HISTORY 12/09/2015  Symptoms Throughout the day  Nighttime awakenings >1/wk but not nightly  Interference with activity Extreme limitations  SABA use Several times/day  Exacerbations requiring oral steroids 2 or more / year    Past Medical History:  Diagnosis  Date  . Anxiety   . Asthma   . Obesity   . Obesity   . Seizures (HCC)      Family History  Problem Relation Age of Onset  . Hypothyroidism Mother   . Diabetes Father   . Diabetes Paternal Uncle   . Hypertension Maternal Grandmother   . Hypertension Maternal Grandfather   . Hypertension Paternal Grandmother   . Heart disease Paternal Grandfather   . Hypertension Paternal Grandfather      Social History   Social History  . Marital status: Legally Separated    Spouse name: N/A  . Number of children: N/A  . Years of education: N/A   Occupational History  . Not on file.   Social History Main Topics  . Smoking status: Never Smoker  . Smokeless tobacco: Never Used  . Alcohol use No  . Drug use: No  . Sexual activity: Yes    Birth control/ protection: None   Other Topics Concern  . Not on file   Social History Narrative  . No narrative on file     Allergies  Allergen Reactions  . Bee Venom Anaphylaxis  . Cinnamon Anaphylaxis  . Ibuprofen Nausea Only     Outpatient Medications Prior to Visit  Medication Sig Dispense Refill  . albuterol (PROVENTIL HFA;VENTOLIN HFA) 108 (90 Base) MCG/ACT inhaler Inhale 2 puffs into the lungs every 6 (six) hours as needed for wheezing or shortness of breath (wheezing). For shortness of breath. 54 Inhaler 3  . budesonide-formoterol (SYMBICORT) 160-4.5 MCG/ACT inhaler Inhale 2 puffs into the lungs 2 (two) times daily. 3 Inhaler 3  . cetirizine (ZYRTEC) 10 MG tablet Take 1 tablet (10 mg total) by mouth daily.  30 tablet 11  . diclofenac (VOLTAREN) 75 MG EC tablet Take 1 tablet (75 mg total) by mouth 2 (two) times daily. Take with food 180 tablet 1  . fluticasone (FLONASE) 50 MCG/ACT nasal spray Place 2 sprays into both nostrils daily. 16 g 6  . omeprazole (PRILOSEC) 20 MG capsule Take 1 capsule (20 mg total) by mouth daily. 30 capsule 3  . tiZANidine (ZANAFLEX) 4 MG tablet Take 1 tablet (4 mg total) by mouth every 6 (six) hours as needed  for muscle spasms. 120 tablet 2  . doxycycline (VIBRAMYCIN) 100 MG capsule Take 1 capsule (100 mg total) by mouth 2 (two) times daily. 20 capsule 0  . LORazepam (ATIVAN) 0.5 MG tablet Take 1-2 tablets (0.5-1 mg total) by mouth 2 (two) times daily as needed for sedation (prior to MRI). 2 tablet 0  . metFORMIN (GLUCOPHAGE-XR) 500 MG 24 hr tablet TAKE 2 TABLETS(1000 MG) BY MOUTH DAILY AFTER SUPPER 60 tablet 2  . acetaminophen-codeine (TYLENOL #3) 300-30 MG tablet TAKE 1 TABLET BY MOUTH EVERY 6 HOURS AS NEEDED FOR PAIN (Patient not taking: Reported on 03/02/2016) 90 tablet 0   No facility-administered medications prior to visit.       Review of Systems  Constitutional: Positive for fatigue. Negative for fever.  HENT: Positive for ear pain, postnasal drip, rhinorrhea, sneezing and sore throat. Negative for hoarse voice and trouble swallowing.   Respiratory: Positive for sputum production, chest tightness, shortness of breath and wheezing. Negative for cough and hemoptysis.   Cardiovascular: Positive for dyspnea on exertion. Negative for chest pain.  Gastrointestinal: Positive for heartburn.       Gerd  Genitourinary: Positive for dysuria and frequency.  Neurological: Positive for headaches.       Objective:   Physical Exam   Vitals:   03/02/16 1219  BP: 140/79  Pulse: 100  Resp: 20  Temp: 98.4 F (36.9 C)  TempSrc: Oral  SpO2: 98%  Weight: (!) 332 lb 9.6 oz (150.9 kg)  Height: 5\' 4"  (1.626 m)    Gen: Pleasant, well-nourished, in no distress,  normal affect  with erythema and turbinate edema,  mouth clear,  oropharynx clear, Mod postnasal drip  Neck: No JVD, no TMG, no carotid bruits  Lungs: No use of accessory muscles, no dullness to percussion,exp insp wheezes  Cardiovascular: RRR, heart sounds normal, no murmur or gallops, no peripheral edema  Abdomen: soft and NT, no HSM,  BS normal  Musculoskeletal: No deformities, no cyanosis or clubbing  Neuro: alert,  non focal  Skin: Warm, no lesions or rashes  No results found.       Assessment & Plan:  I personally reviewed all images and lab data in the [facility] system as well as any outside material available during this office visit and agree with the  radiology impressions.   Asthma, moderate persistent Mod persis asthma with flare Plan Pulse prednisone Cont inhaled meds   Chronic pain of right knee R knee flare of pain ? Etiology pred may help Needs Ortho eval   Courtney Zamora was seen today for follow-up.  Diagnoses and all orders for this visit:  Asthma, moderate persistent, with acute exacerbation  Missed period -     POCT urine pregnancy  Allergic rhinitis with postnasal drip  Chronic pain of right knee  Other orders -     Discontinue: predniSONE (DELTASONE) 10 MG tablet; Take 4 for three days 3 for three days 2 for three days 1 for three days  and stop -     predniSONE (DELTASONE) 10 MG tablet; Take 4 for three days 3 for three days 2 for three days 1 for three days and stop

## 2016-03-03 MED ORDER — ACETAMINOPHEN-CODEINE #3 300-30 MG PO TABS: 1.0000 | ORAL_TABLET | Freq: Four times a day (QID) | ORAL | 0 refills | Status: DC | PRN

## 2016-03-03 NOTE — Telephone Encounter (Signed)
Tylenol #3 ready for pick up Please inform patient  

## 2016-03-03 NOTE — Assessment & Plan Note (Signed)
Mod persis asthma with flare Plan Pulse prednisone Cont inhaled meds

## 2016-03-03 NOTE — Telephone Encounter (Signed)
Patient was informed of prescription being ready for pickup. 

## 2016-03-03 NOTE — Assessment & Plan Note (Signed)
R knee flare of pain ? Etiology pred may help Needs Ortho eval

## 2016-03-04 MED FILL — ACETAMINOPHEN/COD #3 TABLET: 300-30 | 22 days supply | Qty: 90 | Fill #0

## 2016-03-07 ENCOUNTER — Encounter: Payer: Self-pay | Admitting: Critical Care Medicine

## 2016-03-08 LAB — POCT URINE PREGNANCY: Preg Test, Ur: NEGATIVE

## 2016-03-14 ENCOUNTER — Telehealth: Payer: Self-pay | Admitting: Family Medicine

## 2016-03-14 NOTE — Telephone Encounter (Signed)
Pt called the office to speak with the nurse regarding a bump that she has on right leg which is leaking clear discharge. The discharge has been going on for 3 days. Pt doesn't know what to do to make it stop. Please follow up.  Thank you.

## 2016-03-14 NOTE — Telephone Encounter (Signed)
Pt states that she has had the bump on her leg since Friday, the bump busted and it is continuously draining clear liquid. Pt was told to go to urgent care

## 2016-03-17 NOTE — Telephone Encounter (Signed)
Reviewed, patient has f/u appt scheduled for tomorrow. No additional recommendations.

## 2016-03-18 ENCOUNTER — Encounter: Payer: Self-pay | Admitting: Family Medicine

## 2016-03-18 ENCOUNTER — Ambulatory Visit: Payer: Self-pay | Attending: Family Medicine | Admitting: Family Medicine

## 2016-03-18 VITALS — BP 120/78 | HR 73 | Temp 98.0°F | Ht 64.0 in | Wt 341.0 lb

## 2016-03-18 DIAGNOSIS — M25561 Pain in right knee: Secondary | ICD-10-CM | POA: Insufficient documentation

## 2016-03-18 DIAGNOSIS — M7989 Other specified soft tissue disorders: Secondary | ICD-10-CM

## 2016-03-18 DIAGNOSIS — J4540 Moderate persistent asthma, uncomplicated: Secondary | ICD-10-CM

## 2016-03-18 DIAGNOSIS — K219 Gastro-esophageal reflux disease without esophagitis: Secondary | ICD-10-CM

## 2016-03-18 DIAGNOSIS — M25572 Pain in left ankle and joints of left foot: Secondary | ICD-10-CM

## 2016-03-18 DIAGNOSIS — M25532 Pain in left wrist: Secondary | ICD-10-CM

## 2016-03-18 DIAGNOSIS — G8929 Other chronic pain: Secondary | ICD-10-CM

## 2016-03-18 MED ORDER — DICLOFENAC SODIUM 75 MG PO TBEC: 75.0000 mg | DELAYED_RELEASE_TABLET | Freq: Two times a day (BID) | ORAL | 1 refills | Status: DC

## 2016-03-18 MED ORDER — ALBUTEROL SULFATE HFA 108 (90 BASE) MCG/ACT IN AERS: 2.0000 | INHALATION_SPRAY | Freq: Four times a day (QID) | RESPIRATORY_TRACT | 3 refills | Status: DC | PRN

## 2016-03-18 MED ORDER — DIAZEPAM 5 MG PO TABS
5.0000 mg | ORAL_TABLET | Freq: Once | ORAL | 0 refills | Status: AC
Start: 2016-03-18 — End: 2016-03-18

## 2016-03-18 MED ORDER — GABAPENTIN 300 MG PO CAPS
300.0000 mg | ORAL_CAPSULE | Freq: Every day | ORAL | 3 refills | Status: DC
Start: 2016-03-18 — End: 2016-04-21

## 2016-03-18 MED ORDER — CETIRIZINE HCL 10 MG PO TABS: 10.0000 mg | ORAL_TABLET | Freq: Every day | ORAL | 11 refills | Status: DC

## 2016-03-18 MED ORDER — BUDESONIDE-FORMOTEROL FUMARATE 160-4.5 MCG/ACT IN AERO: 2.0000 | INHALATION_SPRAY | Freq: Two times a day (BID) | RESPIRATORY_TRACT | 3 refills | Status: DC

## 2016-03-18 MED ORDER — FLUTICASONE PROPIONATE 50 MCG/ACT NA SUSP: 2.0000 | Freq: Every day | NASAL | 6 refills | Status: DC

## 2016-03-18 MED ORDER — OMEPRAZOLE 20 MG PO CPDR: 20.0000 mg | DELAYED_RELEASE_CAPSULE | Freq: Every day | ORAL | 3 refills | Status: DC

## 2016-03-18 MED ORDER — TIZANIDINE HCL 4 MG PO TABS: 4.0000 mg | ORAL_TABLET | Freq: Four times a day (QID) | ORAL | 2 refills | Status: DC | PRN

## 2016-03-18 MED ORDER — MEDICAL COMPRESSION SOCKS MISC: 1.0000 | Freq: Every day | 0 refills | Status: DC

## 2016-03-18 MED ORDER — FUROSEMIDE 20 MG PO TABS: 20.0000 mg | ORAL_TABLET | Freq: Every day | ORAL | 0 refills | Status: DC

## 2016-03-18 MED FILL — OMEPRAZOLE DR 20 MG CAPSULE: 20 | 30 days supply | Qty: 30 | Fill #0

## 2016-03-18 MED FILL — $Symbicort 160-4.5mcg/act: 160-4.5 | 20 days supply | Qty: 1 | Fill #0

## 2016-03-18 MED FILL — ?DICLOFENAC SOD DR 75 MG TA: 75 | 30 days supply | Qty: 60 | Fill #0

## 2016-03-18 MED FILL — $VENTOLIN HFA 18G INHALER: 108 (90 BAS | 24 days supply | Qty: 18 | Fill #0

## 2016-03-18 MED FILL — GABAPENTIN 300 MG CAPSULE: 300 | 30 days supply | Qty: 30 | Fill #0

## 2016-03-18 MED FILL — ?CETIRIZINE HCL 10 MG TABLE: 10 | 30 days supply | Qty: 30 | Fill #0

## 2016-03-18 MED FILL — FLUTICASONE PROP 50 MCG SPR: 50 | 20 days supply | Qty: 16 | Fill #0

## 2016-03-18 MED FILL — tiZANidine HCL 4 MG TABS: 4 | 30 days supply | Qty: 120 | Fill #0

## 2016-03-18 MED FILL — ?FUROSEMIDE 20 MG TABLET: 20 | 10 days supply | Qty: 10 | Fill #0

## 2016-03-18 NOTE — Progress Notes (Signed)
Subjective:  Patient ID: Courtney Zamora, female    DOB: 01/20/1986  Age: 30 y.o. MRN: [id number]  CC: Follow-up (wrist pain)   HPI Courtney Zamora presents for   1. L wrist pain: she was evaluated in the [facility] on 01/09/16 for L radial wrist pain. She reports pain started after lifting her niece and was exacerbated by her husband pulling her up by her hands from a low chair. She had negative wrist x-ray. She continues to have pain and radial wrist swelling. Pain is exacerbated by radial deviation of the wrist. She is R handed. She has hx of R>L hand pains and numbness that was attributed to carpal tunnel syndrome in the past.   2. Chronic pain: in L ankle and b/l knee. She reports L ankle injury without fracture following a fall at work on 05/07/2013. This was a workman's compensation case, so she  was followed and treated by ortho from 05/09/2013 to 05/21/2015 at [facility], Dr. Ranell Patrick. She has MRI of L ankle 05/17/2013 that was negative for fracture with possible foreign body in heel pad in 2014. She has MRI of L knee x 3 with the first being 05/17/2013 and the most recent obtained on 4/25/206 that revealed mild diffuse chondromalacia patella, slightly larger Baker's cyst, irregular soft tissue ganglion in the posteromedial aspect of the knee, mild contusion or inflammation of the suprapatellar fat pad. Since there was no significant internal derangements noted on MRIs she was treated PT, written out of work and norco and celebrex for pain control. Robaxin was added in 06/2013. Tramadol was prescribed to replace vicodin and flexeril was added in 07/2013. She started developing R foot and ankle pains in 08/2013 but evaluation of this was not covered by workman's compensation. At her last OV with ortho she was referred to pain management for worsening pains.  She reports pain is poorly controlled. She has pain with rest and at night. Pain with walking. Pain with rising for sitting. She  reports that diclofenac was the best of the NSAID, and zanaflex was the best muscle relaxer for her pain . She now takes tylenol #3 for pain control which helps some. She took a course of prednisone which helped quite a bit with her pain. She remains unemployed.   3. R leg swelling: she has chronic leg swelling in setting of obesity. She reports that last week she noted a blister that popped and started draining clear fluid. No leg pain, trauma, redness or fever.   Social History  Substance Use Topics  . Smoking status: Never Smoker  . Smokeless tobacco: Never Used  . Alcohol use No    Outpatient Medications Prior to Visit  Medication Sig Dispense Refill  . acetaminophen-codeine (TYLENOL #3) 300-30 MG tablet Take 1 tablet by mouth every 6 (six) hours as needed. for pain 90 tablet 0  . albuterol (PROVENTIL HFA;VENTOLIN HFA) 108 (90 Base) MCG/ACT inhaler Inhale 2 puffs into the lungs every 6 (six) hours as needed for wheezing or shortness of breath (wheezing). For shortness of breath. 54 Inhaler 3  . budesonide-formoterol (SYMBICORT) 160-4.5 MCG/ACT inhaler Inhale 2 puffs into the lungs 2 (two) times daily. 3 Inhaler 3  . cetirizine (ZYRTEC) 10 MG tablet Take 1 tablet (10 mg total) by mouth daily. 30 tablet 11  . diclofenac (VOLTAREN) 75 MG EC tablet Take 1 tablet (75 mg total) by mouth 2 (two) times daily. Take with food 180 tablet 1  . fluticasone (FLONASE) 50  MCG/ACT nasal spray Place 2 sprays into both nostrils daily. 16 g 6  . omeprazole (PRILOSEC) 20 MG capsule Take 1 capsule (20 mg total) by mouth daily. 30 capsule 3  . tiZANidine (ZANAFLEX) 4 MG tablet Take 1 tablet (4 mg total) by mouth every 6 (six) hours as needed for muscle spasms. 120 tablet 2  . predniSONE (DELTASONE) 10 MG tablet Take 4 for three days 3 for three days 2 for three days 1 for three days and stop (Patient not taking: Reported on 03/18/2016) 30 tablet 0   No facility-administered medications prior to visit.      ROS Review of Systems  Constitutional: Positive for fatigue. Negative for fever.  HENT: Negative for ear pain, postnasal drip, rhinorrhea, sinus pressure, sneezing, sore throat and trouble swallowing.   Respiratory: Negative for cough, chest tightness, shortness of breath and wheezing.   Cardiovascular: Positive for leg swelling. Negative for chest pain.  Gastrointestinal:       Gerd  Musculoskeletal: Positive for arthralgias (chronic knee pain b/l, L ankle, R wrist ).  Neurological: Negative for headaches.    Objective:  BP 120/78 (BP Location: Right Wrist, Patient Position: Sitting, Cuff Size: Small)   Pulse 73   Temp 98 F (36.7 C) (Oral)   Ht 5\' 4"  (1.626 m)   Wt (!) 341 lb (154.7 kg)   LMP 01/09/2016   SpO2 99%   BMI 58.53 kg/m   BP/Weight 03/18/2016 03/02/2016 01/21/2016  Systolic BP 120 140 145  Diastolic BP 78 79 78  Wt. (Lbs) 341 332.6 -  BMI 58.53 57.09 -   BP Readings from Last 3 Encounters:  03/18/16 120/78  03/02/16 140/79  01/21/16 145/78   Pulse Readings from Last 3 Encounters:  03/18/16 73  03/02/16 100  01/21/16 78    Physical Exam  Constitutional: She is oriented to person, place, and time. She appears well-developed and well-nourished. No distress.  Morbidly obese   HENT:  Head: Normocephalic and atraumatic.  Cardiovascular: Intact distal pulses.   Pulmonary/Chest: Effort normal.  Musculoskeletal: She exhibits edema.       Left wrist: She exhibits decreased range of motion, tenderness and swelling. She exhibits no bony tenderness, no effusion, no crepitus and no deformity.       Legs: Radial wrist swelling and tenderness with radial deviation   Neurological: She is alert and oriented to person, place, and time.  Skin: Skin is warm and dry. No rash noted.  Psychiatric: She has a normal mood and affect.   D wrist 01/11/16  CLINICAL DATA: Left distal radial pain and swelling for the past week after lifting a heavy child.  EXAM: LEFT WRIST  - COMPLETE 3+ VIEW  COMPARISON: Left hand series of May 07, 2013  FINDINGS: The bones are reasonably well mineralized for age. No acute fracture or dislocation is observed. The joint spaces are preserved. The soft tissues exhibit no acute abnormalities. Specific attention to the first carpometacarpal joint reveals no abnormality.  IMPRESSION: There is no acute bony abnormality of the left wrist. If the patient's symptoms persist, MRI of the wrist may be a useful next imaging step.    Assessment & Plan:   There are no diagnoses linked to this encounter. Courtney Zamora was seen today for follow-up.  Diagnoses and all orders for this visit:  Wrist pain, acute, left -     MR Wrist Left Wo Contrast; Future -     gabapentin (NEURONTIN) 300 MG capsule; Take 1 capsule (300  mg total) by mouth at bedtime.  Gastroesophageal reflux disease, esophagitis presence not specified -     omeprazole (PRILOSEC) 20 MG capsule; Take 1 capsule (20 mg total) by mouth daily.  Chronic pain of right knee -     tiZANidine (ZANAFLEX) 4 MG tablet; Take 1 tablet (4 mg total) by mouth every 6 (six) hours as needed for muscle spasms. -     diclofenac (VOLTAREN) 75 MG EC tablet; Take 1 tablet (75 mg total) by mouth 2 (two) times daily. Take with food -     MR Knee Right Wo Contrast; Future -     diazepam (VALIUM) 5 MG tablet; Take 1-2 tablets (5-10 mg total) by mouth once. Prior to MRI  Chronic pain of left ankle -     tiZANidine (ZANAFLEX) 4 MG tablet; Take 1 tablet (4 mg total) by mouth every 6 (six) hours as needed for muscle spasms. -     diclofenac (VOLTAREN) 75 MG EC tablet; Take 1 tablet (75 mg total) by mouth 2 (two) times daily. Take with food  Asthma, moderate persistent, uncomplicated -     fluticasone (FLONASE) 50 MCG/ACT nasal spray; Place 2 sprays into both nostrils daily. -     cetirizine (ZYRTEC) 10 MG tablet; Take 1 tablet (10 mg total) by mouth daily. -     Discontinue:  budesonide-formoterol (SYMBICORT) 160-4.5 MCG/ACT inhaler; Inhale 2 puffs into the lungs 2 (two) times daily. -     Discontinue: albuterol (PROVENTIL HFA;VENTOLIN HFA) 108 (90 Base) MCG/ACT inhaler; Inhale 2 puffs into the lungs every 6 (six) hours as needed for wheezing or shortness of breath (wheezing). For shortness of breath. -     budesonide-formoterol (SYMBICORT) 160-4.5 MCG/ACT inhaler; Inhale 2 puffs into the lungs 2 (two) times daily. -     albuterol (PROVENTIL HFA;VENTOLIN HFA) 108 (90 Base) MCG/ACT inhaler; Inhale 2 puffs into the lungs every 6 (six) hours as needed for wheezing or shortness of breath (wheezing). For shortness of breath.  Leg swelling -     furosemide (LASIX) 20 MG tablet; Take 1 tablet (20 mg total) by mouth daily. For leg swelling -     Elastic Bandages & Supports (MEDICAL COMPRESSION SOCKS) MISC; 1 each by Does not apply route daily. Knee high compression stockings 30 mmHg pressure   No orders of the defined types were placed in this encounter.   Follow-up: Return in about 8 weeks (around 05/13/2016) for knee and wrist pains .   Dessa Phi MD

## 2016-03-18 NOTE — Patient Instructions (Addendum)
Courtney Zamora was seen today for follow-up.  Diagnoses and all orders for this visit:  Wrist pain, acute, left -     MR Wrist Left Wo Contrast; Future -     gabapentin (NEURONTIN) 300 MG capsule; Take 1 capsule (300 mg total) by mouth at bedtime.  Gastroesophageal reflux disease, esophagitis presence not specified -     omeprazole (PRILOSEC) 20 MG capsule; Take 1 capsule (20 mg total) by mouth daily.  Chronic pain of right knee -     tiZANidine (ZANAFLEX) 4 MG tablet; Take 1 tablet (4 mg total) by mouth every 6 (six) hours as needed for muscle spasms. -     diclofenac (VOLTAREN) 75 MG EC tablet; Take 1 tablet (75 mg total) by mouth 2 (two) times daily. Take with food -     MR Knee Right Wo Contrast; Future -     diazepam (VALIUM) 5 MG tablet; Take 1-2 tablets (5-10 mg total) by mouth once. Prior to MRI  Chronic pain of left ankle -     tiZANidine (ZANAFLEX) 4 MG tablet; Take 1 tablet (4 mg total) by mouth every 6 (six) hours as needed for muscle spasms. -     diclofenac (VOLTAREN) 75 MG EC tablet; Take 1 tablet (75 mg total) by mouth 2 (two) times daily. Take with food  Asthma, moderate persistent, uncomplicated -     fluticasone (FLONASE) 50 MCG/ACT nasal spray; Place 2 sprays into both nostrils daily. -     cetirizine (ZYRTEC) 10 MG tablet; Take 1 tablet (10 mg total) by mouth daily. -     Discontinue: budesonide-formoterol (SYMBICORT) 160-4.5 MCG/ACT inhaler; Inhale 2 puffs into the lungs 2 (two) times daily. -     Discontinue: albuterol (PROVENTIL HFA;VENTOLIN HFA) 108 (90 Base) MCG/ACT inhaler; Inhale 2 puffs into the lungs every 6 (six) hours as needed for wheezing or shortness of breath (wheezing). For shortness of breath. -     budesonide-formoterol (SYMBICORT) 160-4.5 MCG/ACT inhaler; Inhale 2 puffs into the lungs 2 (two) times daily. -     albuterol (PROVENTIL HFA;VENTOLIN HFA) 108 (90 Base) MCG/ACT inhaler; Inhale 2 puffs into the lungs every 6 (six) hours as needed for wheezing or  shortness of breath (wheezing). For shortness of breath.  Leg swelling -     furosemide (LASIX) 20 MG tablet; Take 1 tablet (20 mg total) by mouth daily. For leg swelling  elevate legs. Low salt diet. Compress legs to treat swelling.   Plan for neurology referral if MRI of wrist is unrevealing  F/u in 8 weeks    Dr. Armen Pickup

## 2016-03-25 DIAGNOSIS — M7989 Other specified soft tissue disorders: Secondary | ICD-10-CM | POA: Insufficient documentation

## 2016-03-25 NOTE — Assessment & Plan Note (Signed)
Leg swelling. Normal BP. Patient refuses blood draw due to needle phobia, but there is no sequelae of severe anemia.   Plan Short course of lasix Leg elevation Leg compression

## 2016-03-25 NOTE — Assessment & Plan Note (Signed)
Acute on chronic R wrist pain following injury at home  Plan: Gabapentin MRI of R knee Plan for neuro referral to [facility] if MRI is unrevealing

## 2016-03-25 NOTE — Assessment & Plan Note (Addendum)
Chronic worsening pain Plan: MRI Obtained and reviewed  previous medical records her imaging of her L knee was rather stable.  Plan: MR R knee Plan for ANA with titer and RF to r/o autoimmune arthritis if patient agrees to blood work.  Continue current regimen of zanaflex, voltaren and tylenol #3 for chronic pain

## 2016-03-28 ENCOUNTER — Telehealth: Payer: Self-pay | Admitting: Family Medicine

## 2016-03-28 ENCOUNTER — Ambulatory Visit (HOSPITAL_COMMUNITY): Payer: Self-pay

## 2016-03-28 ENCOUNTER — Ambulatory Visit (HOSPITAL_COMMUNITY): Admission: RE | Admit: 2016-03-28 | Payer: Self-pay | Source: Ambulatory Visit

## 2016-03-28 NOTE — Telephone Encounter (Signed)
Pt calling requesting to speak to nurse Pt states it was discussed with PCP that she might could be switched to prednisone for her pain and asthma  Pt states her symptoms worsen when the seasons change  Pt also states she is having cold symptoms   Pt was offered an appointment but requested to speak to nurse first  Thanks

## 2016-03-29 ENCOUNTER — Telehealth: Payer: Self-pay | Admitting: Family Medicine

## 2016-03-29 DIAGNOSIS — M25572 Pain in left ankle and joints of left foot: Secondary | ICD-10-CM

## 2016-03-29 DIAGNOSIS — G8929 Other chronic pain: Secondary | ICD-10-CM

## 2016-03-29 DIAGNOSIS — M25561 Pain in right knee: Principal | ICD-10-CM

## 2016-03-29 MED ORDER — ACETAMINOPHEN-CODEINE #3 300-30 MG PO TABS: 1.0000 | ORAL_TABLET | Freq: Four times a day (QID) | ORAL | 2 refills | Status: DC | PRN

## 2016-03-29 NOTE — Telephone Encounter (Signed)
Tylenol #3 refilled  

## 2016-03-29 NOTE — Telephone Encounter (Signed)
Pt called requesting change of medication to prednisone for her pain and asthma and refill on acetaminophen-codeine (TYLENOL #3) 300-30 MG tablet,  Please f/up

## 2016-03-30 MED FILL — ACETAMINOPHEN/COD #3 TABLET: 300-30 | 23 days supply | Qty: 90 | Fill #0

## 2016-03-30 NOTE — Telephone Encounter (Signed)
Pt was called and a message was left to inform her of script being ready for pickup.

## 2016-03-30 NOTE — Telephone Encounter (Signed)
Pt wants a script for prednisone for her asthma due to the season change.

## 2016-03-31 ENCOUNTER — Encounter: Payer: Self-pay | Admitting: Family Medicine

## 2016-03-31 DIAGNOSIS — J4530 Mild persistent asthma, uncomplicated: Secondary | ICD-10-CM

## 2016-03-31 DIAGNOSIS — G8929 Other chronic pain: Secondary | ICD-10-CM

## 2016-03-31 NOTE — Telephone Encounter (Signed)
Pt. Called requesting to speak with her nurse b/c she needs prednisone and she had spoken with her PCP. Please f/u

## 2016-04-01 ENCOUNTER — Encounter (HOSPITAL_COMMUNITY): Payer: Self-pay

## 2016-04-01 ENCOUNTER — Ambulatory Visit (HOSPITAL_COMMUNITY)
Admission: RE | Admit: 2016-04-01 | Discharge: 2016-04-01 | Disposition: A | Discharge status: Home / Self Care | Payer: Self-pay | Source: Ambulatory Visit | Attending: Family Medicine | Admitting: Family Medicine

## 2016-04-01 DIAGNOSIS — M25561 Pain in right knee: Principal | ICD-10-CM

## 2016-04-01 DIAGNOSIS — M948X6 Other specified disorders of cartilage, lower leg: Secondary | ICD-10-CM | POA: Insufficient documentation

## 2016-04-01 DIAGNOSIS — M659 Synovitis and tenosynovitis, unspecified: Secondary | ICD-10-CM | POA: Insufficient documentation

## 2016-04-01 DIAGNOSIS — G8929 Other chronic pain: Secondary | ICD-10-CM | POA: Insufficient documentation

## 2016-04-01 DIAGNOSIS — M23303 Other meniscus derangements, unspecified medial meniscus, right knee: Secondary | ICD-10-CM | POA: Insufficient documentation

## 2016-04-01 DIAGNOSIS — M25461 Effusion, right knee: Secondary | ICD-10-CM | POA: Insufficient documentation

## 2016-04-01 DIAGNOSIS — M25532 Pain in left wrist: Secondary | ICD-10-CM

## 2016-04-01 DIAGNOSIS — M23300 Other meniscus derangements, unspecified lateral meniscus, right knee: Secondary | ICD-10-CM | POA: Insufficient documentation

## 2016-04-01 DIAGNOSIS — R590 Localized enlarged lymph nodes: Secondary | ICD-10-CM | POA: Insufficient documentation

## 2016-04-01 MED ORDER — PREDNISONE 10 MG PO TABS: ORAL_TABLET | ORAL | 0 refills | Status: DC

## 2016-04-01 MED ORDER — BECLOMETHASONE DIPROPIONATE 40 MCG/ACT IN AERS: 1.0000 | INHALATION_SPRAY | Freq: Two times a day (BID) | RESPIRATORY_TRACT | 3 refills | Status: DC

## 2016-04-01 NOTE — Telephone Encounter (Signed)
Pt. Called and stated she needs to speak with her PCP she has been calling since Monday and does not want to go to the ED. Please f/u with pt.

## 2016-04-04 NOTE — Telephone Encounter (Signed)
Pt is calling to get a script for predisone but its not on her meds list. Pt states she needs it for her asthma please F/U

## 2016-04-04 NOTE — Telephone Encounter (Signed)
Prednisone already refilled Patient was notified via mychart

## 2016-04-05 ENCOUNTER — Other Ambulatory Visit: Payer: Self-pay | Admitting: Family Medicine

## 2016-04-05 DIAGNOSIS — G8929 Other chronic pain: Secondary | ICD-10-CM

## 2016-04-05 DIAGNOSIS — M25561 Pain in right knee: Principal | ICD-10-CM

## 2016-04-05 NOTE — Assessment & Plan Note (Signed)
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

## 2016-04-08 MED FILL — $VENTOLIN HFA 18G INHALER: 108 (90 BAS | 24 days supply | Qty: 18 | Fill #1

## 2016-04-12 ENCOUNTER — Telehealth: Payer: Self-pay | Admitting: Family Medicine

## 2016-04-12 MED FILL — FLUTICASONE PROP 50 MCG SPR: 50 | 20 days supply | Qty: 16 | Fill #1

## 2016-04-12 MED FILL — OMEPRAZOLE DR 20 MG CAPSULE: 20 | 30 days supply | Qty: 30 | Fill #1

## 2016-04-12 MED FILL — $Symbicort 160-4.5mcg/act: 160-4.5 | 20 days supply | Qty: 1 | Fill #1

## 2016-04-12 MED FILL — ?DICLOFENAC SOD DR 75 MG TA: 75 | 30 days supply | Qty: 60 | Fill #1

## 2016-04-12 MED FILL — GABAPENTIN 300 MG CAPSULE: 300 | 30 days supply | Qty: 30 | Fill #1

## 2016-04-12 MED FILL — tiZANidine HCL 4 MG TABS: 4 | 30 days supply | Qty: 120 | Fill #1

## 2016-04-12 NOTE — Telephone Encounter (Signed)
Patient would like to speak to Supervisor.  Patient is upset due to lack and delay in communication  Patient states she called on 9/11 and spoke to front office staff member and asked for note to be entered regarding request for prednisone for asthma.   Patient was informed by CMA on 9/12 that no note was entered on her chart, that she would route a message to PCP on her behalf.  Patients request was not addressed until she directly sent a  my chart message to PCP on the 18th.   Please follow up with patient

## 2016-04-20 ENCOUNTER — Ambulatory Visit: Payer: Self-pay

## 2016-04-21 ENCOUNTER — Encounter: Payer: Self-pay | Admitting: Family Medicine

## 2016-04-21 ENCOUNTER — Ambulatory Visit: Payer: Self-pay

## 2016-04-21 ENCOUNTER — Ambulatory Visit: Payer: Self-pay | Attending: Family Medicine | Admitting: Family Medicine

## 2016-04-21 VITALS — BP 135/73 | HR 79 | Temp 97.8°F | Ht 64.0 in | Wt 347.6 lb

## 2016-04-21 DIAGNOSIS — M25532 Pain in left wrist: Secondary | ICD-10-CM | POA: Insufficient documentation

## 2016-04-21 DIAGNOSIS — Z79899 Other long term (current) drug therapy: Secondary | ICD-10-CM | POA: Insufficient documentation

## 2016-04-21 DIAGNOSIS — M25561 Pain in right knee: Secondary | ICD-10-CM

## 2016-04-21 DIAGNOSIS — G8929 Other chronic pain: Secondary | ICD-10-CM

## 2016-04-21 DIAGNOSIS — J209 Acute bronchitis, unspecified: Secondary | ICD-10-CM | POA: Insufficient documentation

## 2016-04-21 MED ORDER — AZITHROMYCIN 250 MG PO TABS: ORAL_TABLET | ORAL | 0 refills | Status: DC

## 2016-04-21 MED ORDER — GABAPENTIN 300 MG PO CAPS: 600.0000 mg | ORAL_CAPSULE | Freq: Every day | ORAL | 3 refills | Status: DC

## 2016-04-21 MED FILL — AZITHROMYCIN 250 MG TABLET: 250 | 5 days supply | Qty: 6 | Fill #0

## 2016-04-21 MED FILL — ACETAMINOPHEN/COD #3 TABLET: 300-30 | 23 days supply | Qty: 90 | Fill #1

## 2016-04-21 NOTE — Progress Notes (Signed)
Subjective:  Patient ID: Courtney Zamora, female    DOB: Jun 18, 1986  Age: 30 y.o. MRN: [id number]  CC: Follow-up (MRI results)   HPI Courtney Zamora has asthma, morbid obesity and chronic pain in knees and L ankle  she presents for   1. Cough:  Productive cough for past 5 days. No fever, chills. Some burning in chest. She took a course of prednisone for cough and cold symptoms associated with her husband having bronchitis two weeks ago. She is a non smoker. She is taking daily symbicort. She is now using albuterol every 4 hrs.   2. Chronic pain: in L ankle and b/l knee. She reports L ankle injury without fracture following a fall at work on 05/07/2013. This was a workman's compensation case, so she  was followed and treated by ortho from 05/09/2013 to 05/21/2015 at [facility], Dr. Ranell PatrickNorris. She has MRI of L ankle 05/17/2013 that was negative for fracture with possible foreign body in heel pad in 2014. She has MRI of L knee x 3 with the first being 05/17/2013 and the most recent obtained on 4/25/206 that revealed mild diffuse chondromalacia patella, slightly larger Baker's cyst, irregular soft tissue ganglion in the posteromedial aspect of the knee, mild contusion or inflammation of the suprapatellar fat pad. Since there was no significant internal derangements noted on MRIs she was treated conservatively with PT, written out of work and norco and celebrex for pain control. Robaxin was added in 06/2013. Tramadol was prescribed to replace vicodin and flexeril was added in 07/2013. She started developing R foot and ankle pains in 08/2013 but evaluation of this was not covered by workman's compensation. At her last OV with ortho she was referred to pain management for worsening pains.  She reports pain is poorly controlled. She has pain with rest and at night. Pain with walking. Pain with rising for sitting. She reports that diclofenac was the best of the NSAID, and zanaflex was the best muscle  relaxer for her pain . She now takes tylenol #3 for pain control which helps some. She took a course of prednisone which helped quite a bit with her pain. She remains unemployed.   Since last visit, she is gabapentin nightly for pain, diclofenac BID, zanaflex and tylenol #3. She still has pain. She completed R knee MRI which revealed:    1. Severe attenuation of the entirety of the medial and lateral meniscus most consistent with severe chronic tears. 2. Severe tricompartmental cartilage abnormalities as described above. 3.  Moderate joint effusion with severe synovitis. 4. Few scattered mildly enlarged popliteal lymph nodes with the largest measuring 12 mm in short axis. These are likely reactive.  She is awaiting orange card approval so her  Orthopaedic referral can be scheduled through [id number].        Social History  Substance Use Topics  . Smoking status: Never Smoker  . Smokeless tobacco: Never Used  . Alcohol use No    Outpatient Medications Prior to Visit  Medication Sig Dispense Refill  . acetaminophen-codeine (TYLENOL #3) 300-30 MG tablet Take 1 tablet by mouth every 6 (six) hours as needed. for pain 90 tablet 2  . albuterol (PROVENTIL HFA;VENTOLIN HFA) 108 (90 Base) MCG/ACT inhaler Inhale 2 puffs into the lungs every 6 (six) hours as needed for wheezing or shortness of breath (wheezing). For shortness of breath. 54 Inhaler 3  . beclomethasone (QVAR) 40 MCG/ACT inhaler Inhale 1 puff into the lungs 2 (two) times daily. 1  Inhaler 3  . budesonide-formoterol (SYMBICORT) 160-4.5 MCG/ACT inhaler Inhale 2 puffs into the lungs 2 (two) times daily. 3 Inhaler 3  . cetirizine (ZYRTEC) 10 MG tablet Take 1 tablet (10 mg total) by mouth daily. 30 tablet 11  . diclofenac (VOLTAREN) 75 MG EC tablet Take 1 tablet (75 mg total) by mouth 2 (two) times daily. Take with food 180 tablet 1  . Elastic Bandages & Supports (MEDICAL COMPRESSION SOCKS) MISC 1 each by Does not apply route daily. Knee high  compression stockings 30 mmHg pressure 2 each 0  . fluticasone (FLONASE) 50 MCG/ACT nasal spray Place 2 sprays into both nostrils daily. 16 g 6  . furosemide (LASIX) 20 MG tablet Take 1 tablet (20 mg total) by mouth daily. For leg swelling 10 tablet 0  . gabapentin (NEURONTIN) 300 MG capsule Take 1 capsule (300 mg total) by mouth at bedtime. 30 capsule 3  . omeprazole (PRILOSEC) 20 MG capsule Take 1 capsule (20 mg total) by mouth daily. 30 capsule 3  . predniSONE (DELTASONE) 10 MG tablet 4 tablets for 3 days, 3 tablets for 3 days, 2 tablets for 3 days, 1 tablet for 3 days the STOP 30 tablet 0  . tiZANidine (ZANAFLEX) 4 MG tablet Take 1 tablet (4 mg total) by mouth every 6 (six) hours as needed for muscle spasms. 120 tablet 2   No facility-administered medications prior to visit.     ROS Review of Systems  Constitutional: Positive for fatigue. Negative for fever.  HENT: Negative for postnasal drip, rhinorrhea, sinus pressure, sneezing, sore throat and trouble swallowing.   Respiratory: Positive for cough. Negative for chest tightness, shortness of breath and wheezing.   Cardiovascular: Positive for leg swelling. Negative for chest pain.  Gastrointestinal:       Gerd  Musculoskeletal: Positive for arthralgias (chronic knee pain b/l, L ankle, R wrist ).  Neurological: Negative for headaches.    Objective:  BP 135/73 (BP Location: Right Wrist, Patient Position: Sitting, Cuff Size: Small)   Pulse 79   Temp 97.8 F (36.6 C) (Oral)   Ht 5\' 4"  (1.626 m)   Wt (!) 347 lb 9.6 oz (157.7 kg)   LMP 03/30/2016   SpO2 100%   BMI 59.67 kg/m   BP/Weight 04/21/2016 03/18/2016 03/02/2016  Systolic BP 135 120 140  Diastolic BP 73 78 79  Wt. (Lbs) 347.6 341 332.6  BMI 59.67 58.53 57.09   BP Readings from Last 3 Encounters:  04/21/16 135/73  03/18/16 120/78  03/02/16 140/79   Pulse Readings from Last 3 Encounters:  04/21/16 79  03/18/16 73  03/02/16 100    Physical Exam  Constitutional: She  is oriented to person, place, and time. She appears well-developed and well-nourished. No distress.  Morbidly obese   HENT:  Head: Normocephalic and atraumatic.  Cardiovascular: Intact distal pulses.   Pulmonary/Chest: Effort normal.  Musculoskeletal: She exhibits edema.       Left wrist: She exhibits decreased range of motion, tenderness and swelling. She exhibits no bony tenderness, no effusion, no crepitus and no deformity.  Radial wrist swelling and tenderness with radial deviation   Neurological: She is alert and oriented to person, place, and time.  Skin: Skin is warm and dry. No rash noted.  Psychiatric: She has a normal mood and affect.   D wrist 01/11/16  CLINICAL DATA: Left distal radial pain and swelling for the past week after lifting a heavy child.  EXAM: LEFT WRIST - COMPLETE 3+ VIEW  COMPARISON: Left hand series of May 07, 2013  FINDINGS: The bones are reasonably well mineralized for age. No acute fracture or dislocation is observed. The joint spaces are preserved. The soft tissues exhibit no acute abnormalities. Specific attention to the first carpometacarpal joint reveals no abnormality.  IMPRESSION: There is no acute bony abnormality of the left wrist. If the patient's symptoms persist, MRI of the wrist may be a useful next imaging step.    Assessment & Plan:  Courtney Zamora was seen today for follow-up.  Diagnoses and all orders for this visit:  Acute bronchitis, unspecified organism -     azithromycin (ZITHROMAX) 250 MG tablet; Take by mouth 500 mg on day 1, then 250 mg daily for 4 days  Wrist pain, acute, left -     gabapentin (NEURONTIN) 300 MG capsule; Take 2 capsules (600 mg total) by mouth at bedtime.   There are no diagnoses linked to this encounter. There are no diagnoses linked to this encounter. No orders of the defined types were placed in this encounter.   Follow-up: No Follow-up on file.   Dessa Phi MD

## 2016-04-21 NOTE — Progress Notes (Signed)
Pt has cough,with chest congestion.

## 2016-04-21 NOTE — Patient Instructions (Addendum)
Courtney Zamora was seen today for follow-up.  Diagnoses and all orders for this visit:  Acute bronchitis, unspecified organism -     azithromycin (ZITHROMAX) 250 MG tablet; Take by mouth 500 mg on day 1, then 250 mg daily for 4 days  Wrist pain, acute, left -     gabapentin (NEURONTIN) 300 MG capsule; Take 2 capsules (600 mg total) by mouth at bedtime.   F/u in 6 weeks for chronic pain (knees) and asthma  Dr. Armen Pickup

## 2016-04-22 DIAGNOSIS — J209 Acute bronchitis, unspecified: Secondary | ICD-10-CM | POA: Insufficient documentation

## 2016-04-22 NOTE — Assessment & Plan Note (Signed)
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  

## 2016-04-22 NOTE — Assessment & Plan Note (Signed)
A; history and exam concerning for bronchitis P: z-pac Continue albuterol Continue symbicort

## 2016-04-25 ENCOUNTER — Emergency Department (HOSPITAL_COMMUNITY): Payer: Self-pay

## 2016-04-25 ENCOUNTER — Encounter (HOSPITAL_COMMUNITY): Payer: Self-pay

## 2016-04-25 ENCOUNTER — Emergency Department (HOSPITAL_COMMUNITY)
Admission: EM | Admit: 2016-04-25 | Discharge: 2016-04-25 | Disposition: A | Discharge status: Home / Self Care | Payer: Self-pay | Attending: Emergency Medicine | Admitting: Emergency Medicine

## 2016-04-25 DIAGNOSIS — S80212A Abrasion, left knee, initial encounter: Secondary | ICD-10-CM | POA: Insufficient documentation

## 2016-04-25 DIAGNOSIS — M79642 Pain in left hand: Secondary | ICD-10-CM

## 2016-04-25 DIAGNOSIS — W101XXA Fall (on)(from) sidewalk curb, initial encounter: Secondary | ICD-10-CM | POA: Insufficient documentation

## 2016-04-25 DIAGNOSIS — J45909 Unspecified asthma, uncomplicated: Secondary | ICD-10-CM | POA: Insufficient documentation

## 2016-04-25 DIAGNOSIS — M25562 Pain in left knee: Secondary | ICD-10-CM

## 2016-04-25 DIAGNOSIS — Z79899 Other long term (current) drug therapy: Secondary | ICD-10-CM | POA: Insufficient documentation

## 2016-04-25 DIAGNOSIS — T148XXA Other injury of unspecified body region, initial encounter: Secondary | ICD-10-CM

## 2016-04-25 DIAGNOSIS — Y999 Unspecified external cause status: Secondary | ICD-10-CM | POA: Insufficient documentation

## 2016-04-25 DIAGNOSIS — Y939 Activity, unspecified: Secondary | ICD-10-CM | POA: Insufficient documentation

## 2016-04-25 DIAGNOSIS — M79672 Pain in left foot: Secondary | ICD-10-CM | POA: Insufficient documentation

## 2016-04-25 DIAGNOSIS — W19XXXA Unspecified fall, initial encounter: Secondary | ICD-10-CM

## 2016-04-25 DIAGNOSIS — Y929 Unspecified place or not applicable: Secondary | ICD-10-CM | POA: Insufficient documentation

## 2016-04-25 DIAGNOSIS — S50312A Abrasion of left elbow, initial encounter: Secondary | ICD-10-CM | POA: Insufficient documentation

## 2016-04-25 MED ORDER — ACETAMINOPHEN 500 MG PO TABS
1000.0000 mg | ORAL_TABLET | Freq: Once | ORAL | Status: AC
  Administered 2016-04-25: 1000 mg via ORAL
  Filled 2016-04-25: qty 2

## 2016-04-25 NOTE — ED Triage Notes (Signed)
Pt was stepping off of a curb onto her L foot and fell. Pt c/o pain in L ankle, L knee, and L hand. Pt c/o pain with movement. Denies head injury. A&Ox4 and ambulatory.

## 2016-04-25 NOTE — ED Provider Notes (Signed)
[facility]-EMERGENCY DEPT Provider Note   CSN: [id number] Arrival date & time: 04/25/16  1701  By signing my name below, I, Emmanuella Mensah, attest that this documentation has been prepared under the direction and in the presence of Cheri Fowler, PA-C. Electronically Signed: Angelene Giovanni, ED Scribe. 04/25/16. 6:12 PM.   History   Chief Complaint Chief Complaint  Patient presents with  . Fall  . Leg Pain  . Hand Pain    HPI Comments: Courtney Zamora is a 30 y.o. female with a hx of obesity who presents to the Emergency Department complaining of gradually worsening left foot pain she describes as cramping s/p fall that occurred PTA. She reports associated left ankle pain, left knee pain, left elbow pain with bruising, and pain to her left 5th finger. She explains that she stepped on a curb when she fell on her left side. She denies any LOC or head injuries. She adds that she was able to ambulate to the car but has not tried to ambulate since being in the ED secondary to pain. No alleviating factors noted. Pt has not tried any medications PTA. She has an allergy to ibuprofen and states that her tetanus vaccine is UTD. She denies any fever, chills, nausea, vomiting, numbness/tingling in BLE, weakness, or any other symptoms. Tetanus up to date.  The history is provided by the patient. No language interpreter was used.    Past Medical History:  Diagnosis Date  . Anxiety   . Asthma   . Obesity   . Obesity   . Seizures [facility])     Patient Active Problem List   Diagnosis Date Noted  . Acute bronchitis 04/22/2016  . Leg swelling 03/25/2016  . Weight gain 01/12/2016  . Wrist pain, acute 01/12/2016  . Esophageal reflux 12/29/2015  . Morbid obesity (HCC) 12/29/2015  . Chronic pain of right knee 12/29/2015  . Chronic pain of left ankle 12/29/2015  . 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 Asthma, moderate persistent 12/09/2015    Past Surgical History:  Procedure Laterality Date  .  TONSILLECTOMY      OB History    Gravida Para Term Preterm AB Living   1       1 0   SAB TAB Ectopic Multiple Live Births   1               Home Medications    Prior to Admission medications   Medication Sig Start Date End Date Taking? Authorizing Provider  acetaminophen-codeine (TYLENOL #3) 300-30 MG tablet Take 1 tablet by mouth every 6 (six) hours as needed. for pain 03/29/16   Dessa Phi, MD  albuterol (PROVENTIL HFA;VENTOLIN HFA) 108 (90 Base) MCG/ACT inhaler Inhale 2 puffs into the lungs every 6 (six) hours as needed for wheezing or shortness of breath (wheezing). For shortness of breath. 03/18/16   Dessa Phi, MD  azithromycin (ZITHROMAX) 250 MG tablet Take by mouth 500 mg on day 1, then 250 mg daily for 4 days 04/21/16   Dessa Phi, MD  budesonide-formoterol (SYMBICORT) 160-4.5 MCG/ACT inhaler Inhale 2 puffs into the lungs 2 (two) times daily. 03/18/16   Josalyn Funches, MD  cetirizine (ZYRTEC) 10 MG tablet Take 1 tablet (10 mg total) by mouth daily. 03/18/16   Dessa Phi, MD  diclofenac (VOLTAREN) 75 MG EC tablet Take 1 tablet (75 mg total) by mouth 2 (two) times daily. Take with food 03/18/16   Dessa Phi, MD  Elastic Bandages & Supports (MEDICAL COMPRESSION SOCKS) MISC  1 each by Does not apply route daily. Knee high compression stockings 30 mmHg pressure 03/18/16   Josalyn Funches, MD  fluticasone (FLONASE) 50 MCG/ACT nasal spray Place 2 sprays into both nostrils daily. 03/18/16   Josalyn Funches, MD  gabapentin (NEURONTIN) 300 MG capsule Take 2 capsules (600 mg total) by mouth at bedtime. 04/21/16   Josalyn Funches, MD  omeprazole (PRILOSEC) 20 MG capsule Take 1 capsule (20 mg total) by mouth daily. 03/18/16   Josalyn Funches, MD  tiZANidine (ZANAFLEX) 4 MG tablet Take 1 tablet (4 mg total) by mouth every 6 (six) hours as needed for muscle spasms. 03/18/16   Dessa Phi, MD    Family History Family History  Problem Relation Age of Onset  . Hypothyroidism Mother     . Diabetes Father   . Diabetes Paternal Uncle   . Hypertension Maternal Grandmother   . Hypertension Maternal Grandfather   . Hypertension Paternal Grandmother   . Heart disease Paternal Grandfather   . Hypertension Paternal Grandfather     Social History Social History  Substance Use Topics  . Smoking status: Never Smoker  . Smokeless tobacco: Never Used  . Alcohol use No     Allergies   Bee venom; Cinnamon; and Ibuprofen   Review of Systems Review of Systems  Constitutional: Negative for chills and fever.  Gastrointestinal: Negative for nausea and vomiting.  Musculoskeletal: Positive for arthralgias.  Skin: Positive for wound.  Neurological: Negative for weakness and numbness.  All other systems reviewed and are negative.    Physical Exam Updated Vital Signs BP 123/95 (BP Location: Left Arm)   Pulse 93   Temp 98.2 F (36.8 C) (Oral)   Resp 16   LMP 03/30/2016   SpO2 100%   Physical Exam  Constitutional: She is oriented to person, place, and time. She appears well-developed and well-nourished.  HENT:  Head: Normocephalic and atraumatic.  Right Ear: External ear normal.  Left Ear: External ear normal.  Eyes: Conjunctivae are normal. No scleral icterus.  Neck: No tracheal deviation present.  Pulmonary/Chest: Effort normal. No respiratory distress.  Abdominal: She exhibits no distension.  Musculoskeletal: Normal range of motion. She exhibits tenderness. She exhibits no edema or deformity.       Left elbow: She exhibits normal range of motion, no swelling, no effusion and no deformity. No tenderness found.       Left wrist: Normal.       Left knee: She exhibits normal range of motion, no swelling, no effusion, no ecchymosis and no deformity. Tenderness found. Medial joint line and lateral joint line tenderness noted.       Left ankle: Normal.       Left hand: She exhibits tenderness and bony tenderness (TTP over distal 5th MCP.  No anatomical snuffbox  tenderness. ). She exhibits normal two-point discrimination, normal capillary refill and no swelling. Normal sensation noted. Normal strength noted.       Left foot: There is tenderness and bony tenderness (over 4th MTP). There is normal range of motion, no swelling and normal capillary refill.  Neurological: She is alert and oriented to person, place, and time.  Skin: Skin is warm and dry. Capillary refill takes less than 2 seconds. Abrasion (to left lateral elbow and left anterior knee) noted. No bruising and no ecchymosis noted.  Psychiatric: She has a normal mood and affect. Her behavior is normal.     ED Treatments / Results  DIAGNOSTIC STUDIES: Oxygen Saturation is 100% on RA,  normal by my interpretation.    COORDINATION OF CARE: 6:09 PM- Pt advised of plan for treatment and pt agrees. Pt will receive left hand x-ray, left ankle x-ray, left knee x-ray , and left foot x-ray for further evaluation. She will also receive Tylenol.    Labs (all labs ordered are listed, but only abnormal results are displayed) Labs Reviewed - No data to display  EKG  EKG Interpretation None       Radiology Dg Ankle Complete Left  Result Date: 04/25/2016 CLINICAL DATA:  Injury while stepping off curb.  Left ankle pain. EXAM: LEFT ANKLE COMPLETE - 3+ VIEW COMPARISON:  01/14/2015 FINDINGS: Diffuse soft tissue swelling identified. No underlying fracture or subluxation identified. Small posterior calcaneal heel spur. IMPRESSION: 1. Heel spur. 2. No acute bone abnormality. Electronically Signed   By: Signa Kell M.D.   On: 04/25/2016 18:19   Dg Knee Complete 4 Views Left  Result Date: 04/25/2016 CLINICAL DATA:  Complains of left knee pain all over increased pain with movement. EXAM: LEFT KNEE - COMPLETE 4+ VIEW COMPARISON:  06/19/2014 FINDINGS: Moderate narrowing of the medial and lateral compartments. There is patellofemoral degenerative change. AP patellar lucency is not confirmed on the lateral view.  There is subcutaneous edema. No definite acute displaced fracture or dislocation. IMPRESSION: Degenerative changes.  No definite acute osseous abnormality. Electronically Signed   By: Jasmine Pang M.D.   On: 04/25/2016 18:19   Dg Hand Complete Left  Result Date: 04/25/2016 CLINICAL DATA:  Fall today. Patient complains of left hand pain all over. EXAM: LEFT HAND - COMPLETE 3+ VIEW COMPARISON:  01/11/2016 FINDINGS: Negative for fracture or dislocation. Alignment of the wrist and carpal bones is unchanged. No focal soft tissue abnormality. IMPRESSION: No acute bone abnormality in left hand. Electronically Signed   By: Richarda Overlie M.D.   On: 04/25/2016 18:14   Dg Foot Complete Left  Result Date: 04/25/2016 CLINICAL DATA:  Larey Seat with left foot pain.  Pain on the lateral side. EXAM: LEFT FOOT - COMPLETE 3+ VIEW COMPARISON:  Ankle 04/25/2016 and 01/14/2015 FINDINGS: Negative for fracture or dislocation. Normal alignment of the left foot. Small calcaneal spurs. Difficult to evaluate for focal soft tissue swelling. IMPRESSION: No acute bone abnormality in left foot. Electronically Signed   By: Richarda Overlie M.D.   On: 04/25/2016 18:56    Procedures Procedures (including critical care time)  Medications Ordered in ED Medications  acetaminophen (TYLENOL) tablet 1,000 mg (1,000 mg Oral Given 04/25/16 1813)     Initial Impression / Assessment and Plan / ED Course  Cheri Fowler, PA-C has reviewed the triage vital signs and the nursing notes.  Pertinent labs & imaging results that were available during my care of the patient were reviewed by me and considered in my medical decision making (see chart for details).  Clinical Course   Patient presents with mechanical fall.  No head injury or LOC.  Plain films negative for acute abnormalities.  Home with post op shoe.  Has walker at home.  Has Tylenol with codeine and diclofenac at home.  Follow up with PCP.  Return precautions discussed.  Stable for discharge.    Final Clinical Impressions(s) / ED Diagnoses   Final diagnoses:  Fall, initial encounter  Left hand pain  Acute pain of left knee  Left foot pain  Abrasion    New Prescriptions New Prescriptions   No medications on file   I personally performed the services described in this documentation,  which was scribed in my presence. The recorded information has been reviewed and is accurate.    Cheri Fowler, PA-C 04/25/16 1933    Bethann Berkshire, MD 04/25/16 2222

## 2016-04-25 NOTE — ED Notes (Signed)
Pt triaged by this RN under Sidney Ace, NT

## 2016-04-27 ENCOUNTER — Other Ambulatory Visit: Payer: Self-pay | Admitting: *Deleted

## 2016-04-27 ENCOUNTER — Ambulatory Visit (INDEPENDENT_AMBULATORY_CARE_PROVIDER_SITE_OTHER): Payer: Self-pay | Admitting: Student

## 2016-04-27 ENCOUNTER — Encounter: Payer: Self-pay | Admitting: Student

## 2016-04-27 VITALS — BP 151/86 | HR 88 | Ht 64.0 in | Wt 347.0 lb

## 2016-04-27 DIAGNOSIS — M25562 Pain in left knee: Secondary | ICD-10-CM

## 2016-04-27 DIAGNOSIS — M25532 Pain in left wrist: Secondary | ICD-10-CM

## 2016-04-27 DIAGNOSIS — S93402A Sprain of unspecified ligament of left ankle, initial encounter: Secondary | ICD-10-CM

## 2016-04-27 MED ORDER — PREDNISONE 10 MG PO TABS: ORAL_TABLET | ORAL | 0 refills | Status: DC

## 2016-04-27 MED ORDER — DIAZEPAM 10 MG PO TABS: ORAL_TABLET | ORAL | 0 refills | Status: DC

## 2016-04-27 MED FILL — predniSONE 10 MG TABS: 10 | 30 days supply | Qty: 48 | Fill #0

## 2016-04-27 NOTE — Patient Instructions (Addendum)
[facility] Both MRIs are scheduled for Thursday 05/05/16 at 9:30am and 10:15am [address] [place], [place] [address]  Phone: [phone]

## 2016-04-27 NOTE — Progress Notes (Signed)
Courtney Zamora - 30 y.o. female MRN [id number]  Date of birth: 11/18/1985  SUBJECTIVE:  Including CC & ROS.  CC: Ankle, knee, wrist pain Presents with left ankle, left knee and left wrist pain after she fell on October 19. She had x-rays of all these which did not show any acute fracture in her ankle near her wrist. She reports that she was looking in her phone and stepped off a curb and fell onto her left side. She fell directly on her wrist. She does not recall how she injured her left knee and left ankle. She does not remember twisting or hyperextending her knee. She does not recall if she inverted her ankle. She does have significant swelling of her ankle. Her knee is quite painful and she feels as though it is swollen. She states that her wrist is very swollen. She denies any numbness or tingling.  She is taking Tylenol No. 3, gabapentin, Zanaflex, diclofenac to help with her pain.  She states that it is not completely controlled. She was originally going to be seen for her right knee that had an MRI performed to it that showed a meniscus tear. She would like to be seen for her fall at this time.   ROS: No unexpected weight loss, fever, chills, swelling, instability, muscle pain, numbness/tingling, redness, otherwise see HPI   PMHx - Updated and reviewed.  Contributory factors include: Morbid obesity, arthritis PSHx - Updated and reviewed.  Contributory factors include:  Negative FHx - Updated and reviewed.  Contributory factors include:  Negative Social Hx - Updated and reviewed. Contributory factors include: Negative Medications - reviewed   DATA REVIEWED: Emergency department visit  PHYSICAL EXAM:  VS: BP:(!) 151/86  HR:88bpm  TEMP: ( )  RESP:   HT:5\' 4"  (162.6 cm)   WT:(!) 347 lb (157.4 kg)  BMI:59.7 PHYSICAL EXAM: Gen: NAD, alert, cooperative with exam, well-appearing, morbidly obese, using walker. HEENT: clear conjunctiva,  CV:  no edema, capillary refill brisk, normal  rate Resp: non-labored Skin: no rashes, normal turgor  Neuro: no gross deficits.  Psych:  alert and oriented Knee: Inspection with no erythema, but has trace effusion on left, bone spurring present bilaterally. Palpation with no warmth, has lateral joint line tenderness bilaterally, also diffuse tenderness around left knee. ROM decreased in extension to 20 degrees, flexion to 80 degrees on left, full ROM on right. Ligaments difficult to examine due to pain, unable to extend leg. Patellar glide with crepitus bilaterally.  Ankle & Foot: Swelling and ecchymosis present on lateral left ankle Arch:  pes planus  Achilles tendon without nodules or tenderness No swelling of retrocalcaneal bursa No pain at MT heads No pain at base of 5th MT; No tenderness over cuboid; No tenderness on lateral and medial malleolus No sign of peroneal tendon subluxations or tenderness to palpation Globally decreased ROM in plantarflexion, dorsiflexion, inversion, and eversion of the foot secondary to pain mostly Strength: 4/5 in all directions secondary to pain. Sensation: intact Vascular: intact w/ dorsalis pedis & posterior tibialis pulses 2+ Stable lateral and medial ligaments; Negative Anterior drawer test  Left wrist is with swelling on the radial aspect. She has tenderness to palpation over this area. She has globally diffuse decreased range of motion secondary to pain and swelling. Strength is weaker on the left compared to the right due to pain. She is neurovascularly intact distally.  ASSESSMENT & PLAN:   Moderate left ankle sprain Swollen left ankle with exquisite tenderness to palpation. We  will recommend that she use an ASO. Limited on supplies to provide her due to insurance. We'll do prednisone to help with the pain. Follow-up in 2 weeks. Continue using walker as needed.  Morbid obesity (HCC) This is likely contributing to her chronic pain. Recommend weight loss.  Left knee pain Difficult  exam as she cannot straighten her knee. X-rays did not show any fracture. We'll get an MRI as she may have had a meniscus tear. Her right knee does have a meniscus tear. We'll discuss this after follow-up.  Left wrist pain This is a acute on chronic injury. Her primary care is a 30 ordered an MRI and recommended that she get this done. She was given a wrist splint to use for comfort. X-rays were negative for any fracture.  Patient was counseled reviewing diagnosis and treatment in detail, totaling in 45 minutes, over half of which was spent in face to face counseling.

## 2016-04-28 DIAGNOSIS — M25562 Pain in left knee: Secondary | ICD-10-CM | POA: Insufficient documentation

## 2016-04-28 DIAGNOSIS — S93402A Sprain of unspecified ligament of left ankle, initial encounter: Secondary | ICD-10-CM | POA: Insufficient documentation

## 2016-04-28 NOTE — Assessment & Plan Note (Signed)
This is a acute on chronic injury. Her primary care is a 30 ordered an MRI and recommended that she get this done. She was given a wrist splint to use for comfort. X-rays were negative for any fracture.

## 2016-04-28 NOTE — Assessment & Plan Note (Signed)
This is likely contributing to her chronic pain. Recommend weight loss.

## 2016-04-28 NOTE — Assessment & Plan Note (Signed)
Difficult exam as she cannot straighten her knee. X-rays did not show any fracture. We'll get an MRI as she may have had a meniscus tear. Her right knee does have a meniscus tear. We'll discuss this after follow-up.

## 2016-04-28 NOTE — Assessment & Plan Note (Signed)
Swollen left ankle with exquisite tenderness to palpation. We will recommend that she use an ASO. Limited on supplies to provide her due to insurance. We'll do prednisone to help with the pain. Follow-up in 2 weeks. Continue using walker as needed.

## 2016-05-02 ENCOUNTER — Ambulatory Visit: Payer: Self-pay | Admitting: Sports Medicine

## 2016-05-02 MED FILL — GABAPENTIN 300 MG CAPSULE: 300 | 30 days supply | Qty: 60 | Fill #0

## 2016-05-05 ENCOUNTER — Ambulatory Visit (HOSPITAL_BASED_OUTPATIENT_CLINIC_OR_DEPARTMENT_OTHER): Payer: No Typology Code available for payment source

## 2016-05-07 ENCOUNTER — Ambulatory Visit (HOSPITAL_BASED_OUTPATIENT_CLINIC_OR_DEPARTMENT_OTHER)
Admission: RE | Admit: 2016-05-07 | Discharge: 2016-05-07 | Disposition: A | Discharge status: Home / Self Care | Payer: No Typology Code available for payment source | Source: Ambulatory Visit | Attending: Student | Admitting: Student

## 2016-05-07 ENCOUNTER — Ambulatory Visit (HOSPITAL_BASED_OUTPATIENT_CLINIC_OR_DEPARTMENT_OTHER)
Admission: RE | Admit: 2016-05-07 | Discharge: 2016-05-07 | Disposition: A | Discharge status: Home / Self Care | Payer: No Typology Code available for payment source | Source: Ambulatory Visit | Attending: Family Medicine | Admitting: Family Medicine

## 2016-05-07 DIAGNOSIS — M25562 Pain in left knee: Secondary | ICD-10-CM

## 2016-05-07 DIAGNOSIS — M7652 Patellar tendinitis, left knee: Secondary | ICD-10-CM | POA: Insufficient documentation

## 2016-05-07 DIAGNOSIS — M659 Synovitis and tenosynovitis, unspecified: Secondary | ICD-10-CM | POA: Insufficient documentation

## 2016-05-07 DIAGNOSIS — M25462 Effusion, left knee: Secondary | ICD-10-CM | POA: Insufficient documentation

## 2016-05-07 DIAGNOSIS — M1712 Unilateral primary osteoarthritis, left knee: Secondary | ICD-10-CM | POA: Insufficient documentation

## 2016-05-07 DIAGNOSIS — M7989 Other specified soft tissue disorders: Secondary | ICD-10-CM | POA: Insufficient documentation

## 2016-05-07 DIAGNOSIS — M23305 Other meniscus derangements, unspecified medial meniscus, unspecified knee: Secondary | ICD-10-CM | POA: Insufficient documentation

## 2016-05-07 DIAGNOSIS — R937 Abnormal findings on diagnostic imaging of other parts of musculoskeletal system: Secondary | ICD-10-CM | POA: Insufficient documentation

## 2016-05-07 DIAGNOSIS — M21832 Other specified acquired deformities of left forearm: Secondary | ICD-10-CM | POA: Insufficient documentation

## 2016-05-12 ENCOUNTER — Other Ambulatory Visit: Payer: Self-pay | Admitting: Family Medicine

## 2016-05-12 DIAGNOSIS — M25532 Pain in left wrist: Secondary | ICD-10-CM

## 2016-05-12 NOTE — Assessment & Plan Note (Signed)
There are multiple findings on left wrist MRI there is evidence of ulnar nerve impingement this will cause pain on outside of wrist near pinky There is degeneration of cartilage on the side of the wrist as well There is a likely tear in the ligament that connects two wrist bones on the radial side near the thumb (scaphoid and lunate) without separation of the bones There is degeneration of joint surfaces and inflammation of tendons.  There is a lot going on in the wrist concerning for inflammatory joint disease, I recommend blood work (CRP, sed rate, ANA, RF, CCP, uric acid, CBC) to evaluate for inflammatory arthritis.   I have referred Courtney Zamora to a hand surgery specialist as well

## 2016-05-16 MED FILL — ACETAMINOPHEN/COD #3 TABLET: 300-30 | 23 days supply | Qty: 90 | Fill #2

## 2016-05-16 MED FILL — ?CETIRIZINE HCL 10 MG TABLE: 10 | 30 days supply | Qty: 30 | Fill #1

## 2016-05-16 MED FILL — tiZANidine HCL 4 MG TABS: 4 | 30 days supply | Qty: 120 | Fill #2

## 2016-05-16 MED FILL — FLUTICASONE PROP 50 MCG SPR: 50 | 20 days supply | Qty: 16 | Fill #2

## 2016-05-16 MED FILL — ?OMEPRAZOLE DR 20 MG CAPSUL: 20 | 30 days supply | Qty: 30 | Fill #2

## 2016-05-16 MED FILL — ?DICLOFENAC SOD DR 75 MG TA: 75 | 30 days supply | Qty: 60 | Fill #2

## 2016-05-31 MED FILL — GABAPENTIN 300 MG CAPSULE: 300 | 30 days supply | Qty: 60 | Fill #1

## 2016-06-01 ENCOUNTER — Encounter: Payer: Self-pay | Admitting: Student

## 2016-06-01 ENCOUNTER — Ambulatory Visit (INDEPENDENT_AMBULATORY_CARE_PROVIDER_SITE_OTHER): Payer: Self-pay | Admitting: Student

## 2016-06-01 ENCOUNTER — Ambulatory Visit (INDEPENDENT_AMBULATORY_CARE_PROVIDER_SITE_OTHER): Payer: No Typology Code available for payment source | Admitting: Orthopaedic Surgery

## 2016-06-01 VITALS — BP 146/112 | HR 92 | Ht 64.0 in | Wt 347.0 lb

## 2016-06-01 DIAGNOSIS — S83242A Other tear of medial meniscus, current injury, left knee, initial encounter: Secondary | ICD-10-CM | POA: Insufficient documentation

## 2016-06-01 DIAGNOSIS — M25531 Pain in right wrist: Secondary | ICD-10-CM

## 2016-06-01 DIAGNOSIS — G8929 Other chronic pain: Secondary | ICD-10-CM

## 2016-06-01 DIAGNOSIS — M25532 Pain in left wrist: Secondary | ICD-10-CM

## 2016-06-01 DIAGNOSIS — M25562 Pain in left knee: Secondary | ICD-10-CM

## 2016-06-01 DIAGNOSIS — S83242D Other tear of medial meniscus, current injury, left knee, subsequent encounter: Secondary | ICD-10-CM

## 2016-06-01 NOTE — Progress Notes (Signed)
Office Visit Note   Patient: Courtney Zamora           Date of Birth: [birth year]           MRN: [id number] Visit Date: 06/01/2016              Requested by: Dessa Phi, MD [address] [place], [place] [address] PCP: Lora Paula, MD   Assessment & Plan: Visit Diagnoses:  1. Pain in left wrist   2. Pain in right wrist     Plan: We went over the MRI report of her left wrist. This is quite a complex situation. I do feel that she would benefit from referral to a hand specialist. 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 Given that I do feel that her being evaluated by hand specialist would be most appropriate given the complexity of her issues.  We will work on making that referral  Follow-Up Instructions: Return if symptoms worsen or fail to improve.   Orders:  No orders of the defined types were placed in this encounter.  No orders of the defined types were placed in this encounter.     Procedures: No procedures performed   Clinical Data: No additional findings.   Subjective: Chief Complaint  Patient presents with  . Left Wrist - Pain    Patient states she has had this pain for "several years". She states she thinks it was from a lot of use with crutches when she broke her knee a few years ago. She states she has prednisone before and this works great, but states she will NOT have inject   Courtney Zamora is a very pleasant individual who reports bilateral wrist pain with left being worse than the right. This is been going on for several years now and at least 2015 when she was having to get around on crutches for long period time due to a work-related injury. She points to the dorsal side of her left wrist as  source for pain in her left wrist hurts significantly worse in the right wrist. An MRI accompanies her of her left wrist for me to review. Her pain can be 10 at 10 at times. She has been on several rounds of prednisone which has helped her pain significantly. HPI  Review of Systems Negative for headache, fever, chills, nausea, vomiting, chest pain, shortness of breath.  Objective: Vital Signs: There were no vitals taken for this visit.  Physical Exam She is alert and oriented 3 with no acute distress. Ortho Exam Her left wrist has a fluid range of motion but is deathly tender with palmar flexion and dorsiflexion. He is tender with radial and ulnar deviation. I feel no clunk in her wrist going from radial to ulnar deviation e has a weak grip strength is well. They're subjective numbness around her index and and thumb. She has ulnar sided wrist pain as well to palpation. He has no crepitation of the extensor tendons and she can fully flex and extend at the MCP and IP joints but there is definite tenderness to palpation over the extensor tendons. She has palpable pulses in her wrist and her fingers are well perfused Specialty Comments:  No specialty comments available.  Imaging:  No results found. The MRI of her left wrist is reviewed and is quite calm and situation. She has ulnar negative variance as well as a cartilage defect of the central distal radius. It is difficult to tell if there is a TFCC tear. She does have extensive tenosynovitis of the extensor tendons and some chronic changes around the SL ligament and the scaphoid and radial styloid.  PMFS History: Patient Active Problem List   Diagnosis Date Noted  . Left knee pain 04/28/2016  . Moderate left ankle sprain 04/28/2016  . Acute bronchitis 04/22/2016  . Leg swelling 03/25/2016  . Weight gain 01/12/2016  . Left wrist pain 01/12/2016  . Esophageal reflux 12/29/2015  . Morbid obesity (HCC) 12/29/2015  . Chronic pain of right knee  12/29/2015  . Chronic pain of left ankle 12/29/2015  . 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 Asthma, moderate persistent 12/09/2015   Past Medical History:  Diagnosis Date  . Anxiety   . Asthma   . Obesity   . Obesity   . Seizures (HCC)     Family History  Problem Relation Age of Onset  . Hypothyroidism Mother   . Diabetes Father   . Diabetes Paternal Uncle   . Hypertension Maternal Grandmother   . Hypertension Maternal Grandfather   . Hypertension Paternal Grandmother   . Heart disease Paternal Grandfather   . Hypertension Paternal Grandfather     Past Surgical History:  Procedure Laterality Date  . TONSILLECTOMY     Social History   Occupational History  . Not on file.   Social History Main Topics  . Smoking status: Never Smoker  . Smokeless tobacco: Never Used  . Alcohol use No  . Drug use: No  . Sexual activity: Yes    Birth control/ protection: None

## 2016-06-01 NOTE — Addendum Note (Signed)
Addended by: Shonna Chock on: 06/01/2016 01:24 PM   Modules accepted: Orders

## 2016-06-01 NOTE — Assessment & Plan Note (Addendum)
Discussed options with patient. Options include medications which she is already on a regimen. Did discuss the risk of being on long-term prednisone. She may need pain management referral in the future. Discussed surgical options which include knee arthroscopy and joint replacement for which she would likely be a poor candidate for. Did discuss this with patient. 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 The due to her limited options will get a surgical opinion. Recommended weight loss as well.  Discussed injection therapy for which she has a severe needle phobia and has had syncopal episodes in the past as well as seizures. She would not like to get the needles due to the seizure aspect.

## 2016-06-01 NOTE — Progress Notes (Signed)
  Courtney Zamora - 30 y.o. female MRN [id number]  Date of birth: 05/06/1986  SUBJECTIVE:  Including CC & ROS.  CC: chronic left knee pain Patient presents with chronic left knee pain that was a acute on chronic flare when she was seen at her last visit last month after she fell. She presents to review her MRI which showed a severely diminutive lateral meniscus and severely demented and posterior horn and mid body meniscus tear. She also had markedly severe tricompartmental osteoarthritis and a small knee effusion.  She is still having pain but it is improved. She is no longer using a walker. She reports a prednisone is really only thing that makes it better. She is on chronic diclofenac and Tylenol No. 3 to help her sleep. She feels as though she is only able to lose weight when she is on prednisone because she can move better.   ROS: No unexpected weight loss, fever, chills, swelling, instability, muscle pain, numbness/tingling, redness, otherwise see HPI   PMHx - Updated and reviewed.  Contributory factors include: Morbid obesity PSHx - Updated and reviewed.  Contributory factors include:  Negative FHx - Updated and reviewed.  Contributory factors include:  Negative Social Hx - Updated and reviewed. Contributory factors include: Negative Medications - reviewed   DATA REVIEWED: Reviewed her left knee MRI results as stated above with patient  PHYSICAL EXAM:  VS: BP:(!) 146/112  HR:92bpm  TEMP: ( )  RESP:   HT:5\' 4"  (162.6 cm)   WT:(!) 347 lb (157.4 kg)  BMI:59.7 PHYSICAL EXAM: Gen: NAD, alert, cooperative with exam, well-appearing HEENT: clear conjunctiva,  CV:  no edema, capillary refill brisk, normal rate Resp: non-labored Skin: no rashes, normal turgor  Neuro: no gross deficits.  Psych:  alert and oriented  Knee: Normal to inspection with no erythema or effusion or obvious bony abnormalities. Palpation normal with no warmth, but had diffuse pain, joint line and lateral and  medial. ROM limited in flexion and extension and lower leg rotation secondary to pain. Ligaments with solid consistent endpoints including ACL, PCL, LCL, MCL. Negative Mcmurray's, Apley's, and Thessalonian tests. Non painful patellar compression. Patellar glide with crepitus.   ASSESSMENT & PLAN:   Tear of medial meniscus of left knee, current Discussed options with patient. Options include medications which she is already on a regimen. Did discuss the risk of being on long-term prednisone. She may need pain management referral in the future. Discussed surgical options which include knee arthroscopy and joint replacement for which she would likely be a poor candidate for. Did discuss this with patient. 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 The due to her limited options will get a surgical opinion. Recommended weight loss as well.  Discussed injection therapy for which she has a severe needle phobia and has had syncopal episodes in the past as well as seizures. She would not like to get the needles due to the seizure aspect.

## 2016-06-10 ENCOUNTER — Encounter: Payer: Self-pay | Admitting: Critical Care Medicine

## 2016-06-13 ENCOUNTER — Telehealth (INDEPENDENT_AMBULATORY_CARE_PROVIDER_SITE_OTHER): Payer: Self-pay | Admitting: *Deleted

## 2016-06-13 NOTE — Telephone Encounter (Signed)
PT. Called stating her appt has with Dr. [name] and now it has been changed to Dr. [name]. She previously saw Dr. [name] but is now being referred to Dr. [name] and would rather see [name]. Pt asking for explanation on why her appt was moved. [phone]

## 2016-06-13 NOTE — Telephone Encounter (Signed)
Spoke to Dr. [name] and changed pt back to his schedule and called pt with new appt.

## 2016-06-14 ENCOUNTER — Other Ambulatory Visit: Payer: Self-pay | Admitting: *Deleted

## 2016-06-14 ENCOUNTER — Other Ambulatory Visit: Payer: Self-pay | Admitting: Family Medicine

## 2016-06-14 DIAGNOSIS — G8929 Other chronic pain: Secondary | ICD-10-CM

## 2016-06-14 DIAGNOSIS — M25572 Pain in left ankle and joints of left foot: Secondary | ICD-10-CM

## 2016-06-14 DIAGNOSIS — M25561 Pain in right knee: Principal | ICD-10-CM

## 2016-06-14 MED FILL — ?CETIRIZINE HCL 10 MG TABLE: 10 | 30 days supply | Qty: 30 | Fill #2

## 2016-06-14 MED FILL — DICLOFENAC SOD DR 75 MG TAB: 75 | 30 days supply | Qty: 60 | Fill #3

## 2016-06-14 MED FILL — ?OMEPRAZOLE DR 20 MG CAPSUL: 20 | 30 days supply | Qty: 30 | Fill #3

## 2016-06-14 MED FILL — tiZANidine HCL 4 MG TABS: 4 | 30 days supply | Qty: 120 | Fill #0

## 2016-06-15 ENCOUNTER — Other Ambulatory Visit: Payer: Self-pay | Admitting: *Deleted

## 2016-06-15 MED ORDER — PREDNISONE 10 MG PO TABS: ORAL_TABLET | ORAL | 0 refills | Status: DC

## 2016-06-15 NOTE — Telephone Encounter (Signed)
MA emailed patient regarding her concern with the advise from the provider.

## 2016-06-15 NOTE — Telephone Encounter (Signed)
Please advise me on this concern. I will share the information with the patient.

## 2016-06-16 ENCOUNTER — Telehealth: Payer: Self-pay | Admitting: Family Medicine

## 2016-06-16 ENCOUNTER — Other Ambulatory Visit: Payer: Self-pay | Admitting: Family Medicine

## 2016-06-16 ENCOUNTER — Ambulatory Visit (INDEPENDENT_AMBULATORY_CARE_PROVIDER_SITE_OTHER): Payer: No Typology Code available for payment source | Admitting: Orthopaedic Surgery

## 2016-06-16 DIAGNOSIS — M25572 Pain in left ankle and joints of left foot: Secondary | ICD-10-CM

## 2016-06-16 DIAGNOSIS — M25561 Pain in right knee: Principal | ICD-10-CM

## 2016-06-16 DIAGNOSIS — G8929 Other chronic pain: Secondary | ICD-10-CM

## 2016-06-16 MED ORDER — ACETAMINOPHEN-CODEINE #3 300-30 MG PO TABS: 1.0000 | ORAL_TABLET | Freq: Four times a day (QID) | ORAL | 2 refills | Status: DC | PRN

## 2016-06-16 MED FILL — ACETAMINOPHEN/COD #3 TABLET: 300-30 | 30 days supply | Qty: 120 | Fill #0

## 2016-06-16 MED FILL — $VENTOLIN HFA 18G INHALER: 108 (90 BAS | 24 days supply | Qty: 18 | Fill #2

## 2016-06-16 MED FILL — $Symbicort 160-4.5mcg/act: 160-4.5 | 20 days supply | Qty: 1 | Fill #1

## 2016-06-16 NOTE — Telephone Encounter (Signed)
Refill done.  

## 2016-06-16 NOTE — Telephone Encounter (Signed)
Patient is requesting medication for tylenol #3.Marland Kitchen  Please call patient when medication is ready for pick up

## 2016-06-20 ENCOUNTER — Ambulatory Visit (INDEPENDENT_AMBULATORY_CARE_PROVIDER_SITE_OTHER): Payer: No Typology Code available for payment source | Admitting: Orthopaedic Surgery

## 2016-06-24 ENCOUNTER — Encounter (INDEPENDENT_AMBULATORY_CARE_PROVIDER_SITE_OTHER): Payer: Self-pay | Admitting: Orthopaedic Surgery

## 2016-06-24 ENCOUNTER — Ambulatory Visit (INDEPENDENT_AMBULATORY_CARE_PROVIDER_SITE_OTHER): Payer: No Typology Code available for payment source | Admitting: Orthopaedic Surgery

## 2016-06-24 DIAGNOSIS — M25561 Pain in right knee: Secondary | ICD-10-CM

## 2016-06-24 DIAGNOSIS — G8929 Other chronic pain: Secondary | ICD-10-CM

## 2016-06-24 DIAGNOSIS — M25562 Pain in left knee: Secondary | ICD-10-CM

## 2016-06-24 NOTE — Progress Notes (Signed)
Office Visit Note   Patient: Courtney Zamora           Date of Birth: Feb 08, 1986           MRN: [id number] Visit Date: 06/24/2016              Requested by: Dessa Phi, MD [address] [place], [place] [address] PCP: Lora Paula, MD   Assessment & Plan: Visit Diagnoses:  1. Chronic pain of both knees     Plan: Patient has advanced degenerative joint disease for her age based on MRI. I strongly recommended weight loss and to not rely on oral prednisone. Patient is not very responsive or receptive of medical advice. I did recommend several conservative measures including water aerobics, weight loss, bariatric clinic.  Pain clinic referral made  Follow-Up Instructions: No Follow-up on file.   Orders:  Orders Placed This Encounter  Procedures  . Ambulatory referral to Pain Clinic   No orders of the defined types were placed in this encounter.     Procedures: No procedures performed   Clinical Data: No additional findings.   Subjective: Chief Complaint  Patient presents with  . Left Knee - Pain  . Right Knee - Pain    Patient is a super morbidly obese 30 year old female who comes in with bilateral knee pain that is constant and burns. She endorses clicking popping and buckling. Pain is worse with any sort of activity. She takes Tylenol No. 3 which was very little. She has previously seen Dr. [name] but she thinks he is in her words "a retard."  Oral prednisone has helped. She has a severe needle phobia. Therefore does not want any sort of injections.    Review of Systems  Constitutional: Negative.   HENT: Negative.   Eyes: Negative.   Respiratory: Negative.   Cardiovascular: Negative.   Endocrine: Negative.   Musculoskeletal: Negative.   Neurological: Negative.   Hematological: Negative.   Psychiatric/Behavioral: Negative.   All other systems reviewed and are negative.    Objective: Vital Signs: There were no vitals taken for this  visit.  Physical Exam Morbidly obese female in no acute distress. Ortho Exam Exam bilateral knee shows crepitance with range of motion of the knees. Collaterals and cruciates are stable. Large soft tissue envelope. Specialty Comments:  No specialty comments available.  Imaging: No results found.   PMFS History: Patient Active Problem List   Diagnosis Date Noted  . Tear of medial meniscus of left knee, current 06/01/2016  . Left knee pain 04/28/2016  . Moderate left ankle sprain 04/28/2016  . Acute bronchitis 04/22/2016  . Leg swelling 03/25/2016  . Weight gain 01/12/2016  . Left wrist pain 01/12/2016  . Esophageal reflux 12/29/2015  . Morbid obesity (HCC) 12/29/2015  . Chronic pain of right knee 12/29/2015  . Chronic pain of left ankle 12/29/2015  . 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 Asthma, moderate persistent 12/09/2015   Past Medical History:  Diagnosis Date  . Anxiety   . Asthma   . Obesity   . Obesity   . Seizures (HCC)     Family History  Problem Relation Age of Onset  . Hypothyroidism Mother   . Diabetes Father   . Diabetes Paternal Uncle   . Hypertension Maternal Grandmother   . Hypertension Maternal Grandfather   . Hypertension Paternal Grandmother   . Heart disease Paternal Grandfather   . Hypertension Paternal Grandfather     Past Surgical History:  Procedure  Laterality Date  . TONSILLECTOMY     Social History   Occupational History  . Not on file.   Social History Main Topics  . Smoking status: Never Smoker  . Smokeless tobacco: Never Used  . Alcohol use No  . Drug use: No  . Sexual activity: Yes    Birth control/ protection: None

## 2016-06-28 ENCOUNTER — Encounter: Payer: Self-pay | Admitting: Critical Care Medicine

## 2016-06-28 ENCOUNTER — Encounter: Payer: Self-pay | Admitting: Family Medicine

## 2016-06-28 DIAGNOSIS — M25572 Pain in left ankle and joints of left foot: Secondary | ICD-10-CM

## 2016-06-28 DIAGNOSIS — M25561 Pain in right knee: Secondary | ICD-10-CM

## 2016-06-28 DIAGNOSIS — G8929 Other chronic pain: Secondary | ICD-10-CM

## 2016-06-28 DIAGNOSIS — M25532 Pain in left wrist: Secondary | ICD-10-CM

## 2016-06-28 NOTE — Telephone Encounter (Signed)
Patient concerns

## 2016-06-29 MED ORDER — GABAPENTIN 300 MG PO CAPS: 600.0000 mg | ORAL_CAPSULE | Freq: Every day | ORAL | 3 refills | Status: DC

## 2016-06-29 NOTE — Addendum Note (Signed)
Addended by: Dessa Phi on: 06/29/2016 09:18 PM   Modules accepted: Orders

## 2016-06-29 NOTE — Addendum Note (Signed)
Addended by: Dessa Phi on: 06/29/2016 10:22 PM   Modules accepted: Orders

## 2016-06-29 NOTE — Telephone Encounter (Signed)
Is this possible?

## 2016-06-30 ENCOUNTER — Other Ambulatory Visit: Payer: Self-pay | Admitting: Family Medicine

## 2016-06-30 MED ORDER — ACETAMINOPHEN-CODEINE #4 300-60 MG PO TABS: 1.0000 | ORAL_TABLET | Freq: Four times a day (QID) | ORAL | 0 refills | Status: DC | PRN

## 2016-06-30 MED FILL — GABAPENTIN 300 MG CAPSULE: 300 | 30 days supply | Qty: 60 | Fill #2

## 2016-07-06 ENCOUNTER — Ambulatory Visit: Payer: Self-pay | Attending: Critical Care Medicine | Admitting: Critical Care Medicine

## 2016-07-06 ENCOUNTER — Encounter: Payer: Self-pay | Admitting: Critical Care Medicine

## 2016-07-06 VITALS — BP 129/81 | HR 79 | Temp 98.3°F | Resp 18 | Ht 64.0 in | Wt 348.3 lb

## 2016-07-06 DIAGNOSIS — K219 Gastro-esophageal reflux disease without esophagitis: Secondary | ICD-10-CM | POA: Insufficient documentation

## 2016-07-06 DIAGNOSIS — J4541 Moderate persistent asthma with (acute) exacerbation: Secondary | ICD-10-CM

## 2016-07-06 DIAGNOSIS — J4540 Moderate persistent asthma, uncomplicated: Secondary | ICD-10-CM | POA: Insufficient documentation

## 2016-07-06 DIAGNOSIS — J309 Allergic rhinitis, unspecified: Secondary | ICD-10-CM

## 2016-07-06 DIAGNOSIS — R0982 Postnasal drip: Secondary | ICD-10-CM

## 2016-07-06 DIAGNOSIS — J209 Acute bronchitis, unspecified: Secondary | ICD-10-CM | POA: Insufficient documentation

## 2016-07-06 MED ORDER — PREDNISONE 10 MG PO TABS: ORAL_TABLET | ORAL | 0 refills | Status: DC

## 2016-07-06 MED ORDER — FAMOTIDINE 40 MG PO TABS: 40.0000 mg | ORAL_TABLET | Freq: Every day | ORAL | 6 refills | Status: DC

## 2016-07-06 MED ORDER — FLUTICASONE PROPIONATE 50 MCG/ACT NA SUSP: 2.0000 | Freq: Two times a day (BID) | NASAL | 6 refills | Status: DC

## 2016-07-06 MED FILL — FAMOTIDINE 40 MG TABLET: 40 | 30 days supply | Qty: 30 | Fill #0

## 2016-07-06 MED FILL — predniSONE 10 MG TABS: 10 | 6 days supply | Qty: 20 | Fill #0

## 2016-07-06 MED FILL — FLUTICASONE PROP 50 MCG SPR: 50 | 30 days supply | Qty: 16 | Fill #0

## 2016-07-06 NOTE — Patient Instructions (Signed)
Take prednisone 10mg  Take 4 for two days three for two days two for two days one for two days Increase flonase two spray ea nostril twice daily Take pepcid famotidine 40mg  at bedtime and stop prilosec Stay on inhalers Return 4 months

## 2016-07-06 NOTE — Progress Notes (Signed)
Patient is here for FU  Patient complains of wheezing and SOB Patient has some swelling in the lower extremities.  Patient has not taken medication today. Patient has not eaten today.

## 2016-07-06 NOTE — Progress Notes (Signed)
Subjective:    Patient ID: Courtney Zamora, female    DOB: Dec 14, 1985, 30 y.o.   MRN: [id number]  30 y.o. F with moderate persistent asthma. Last seen 02/2016. Was ok until extreme cold exposure recently and now worse    Asthma  She complains of chest tightness, cough, difficulty breathing, frequent throat clearing, shortness of breath, sputum production and wheezing. There is no hemoptysis or hoarse voice. Primary symptoms comments: Mucus, white. This is a recurrent problem. The current episode started in the past 7 days. The problem occurs 2 to 4 times per day. The problem has been rapidly worsening. The cough is productive of sputum, productive and hacking. Associated symptoms include dyspnea on exertion, ear congestion, ear pain, nasal congestion, PND, postnasal drip and a sore throat. Pertinent negatives include no fever, heartburn, rhinorrhea, sneezing or trouble swallowing. Her symptoms are aggravated by lying down. Her past medical history is significant for asthma.   PUL ASTHMA HISTORY 07/07/2016 12/09/2015  Symptoms Throughout the day Throughout the day  Nighttime awakenings >1/wk but not nightly >1/wk but not nightly  Interference with activity Extreme limitations Extreme limitations  SABA use Several times/day Several times/day  Exacerbations requiring oral steroids 2 or more / year 2 or more / year      Review of Systems  Constitutional: Positive for fatigue. Negative for fever.  HENT: Positive for ear pain, postnasal drip and sore throat. Negative for hoarse voice, rhinorrhea, sneezing and trouble swallowing.   Respiratory: Positive for cough, sputum production, shortness of breath and wheezing. Negative for hemoptysis.   Cardiovascular: Positive for dyspnea on exertion and PND.  Gastrointestinal: Negative for heartburn.       Objective:   Physical Exam Vitals:   07/06/16 1039  BP: 129/81  Pulse: 79  Resp: 18  Temp: 98.3 F (36.8 C)  TempSrc: Oral  SpO2: 100%    Weight: (!) 348 lb 4.8 oz (158 kg)  Height: 5\' 4"  (1.626 m)    Gen: Pleasant, well-nourished, in no distress,  normal affect  ENT: No lesions,  mouth clear,  oropharynx clear, no postnasal drip  Neck: No JVD, no TMG, no carotid bruits  Lungs: No use of accessory muscles, no dullness to percussion, expired wheezes  Cardiovascular: RRR, heart sounds normal, no murmur or gallops, no peripheral edema  Abdomen: soft and NT, no HSM,  BS normal  Musculoskeletal: No deformities, no cyanosis or clubbing  Neuro: alert, non focal  Skin: Warm, no lesions or rashes  No results found.  Data from ED reviewed.  CXR ok from 12/2015 No pfts ever performed No allergy testing has been done     Assessment & Plan:  I personally reviewed all images and lab data in the [facility] system as well as any outside material available during this office visit and agree with the  radiology impressions.   Asthma, moderate persistent Moderate persistent asthma with flare Poor HFA technique GERD playing a role and atopy Plan Take prednisone 10mg  Take 4 for two days three for two days two for two days one for two days Increase flonase two spray ea nostril twice daily Take pepcid famotidine 40mg  at bedtime and stop prilosec Stay on inhalers Return 4 months  Acute bronchitis Complete current ABX Rx from ED   Esophageal reflux Failed PPI d/t side effects Trial qhs pepcid 40mg     Courtney Zamora was seen today for hospitalization follow-up.  Diagnoses and all orders for this visit:  Moderate persistent asthma with acute exacerbation  Allergic rhinitis with postnasal drip  Acute bronchitis, unspecified organism  Gastroesophageal reflux disease, esophagitis presence not specified  Other orders -     famotidine (PEPCID) 40 MG tablet; Take 1 tablet (40 mg total) by mouth at bedtime. -     predniSONE (DELTASONE) 10 MG tablet; Take 4 for two days three for two days two for two days one for two days -      fluticasone (FLONASE) 50 MCG/ACT nasal spray; Place 2 sprays into both nostrils 2 (two) times daily.

## 2016-07-07 NOTE — Assessment & Plan Note (Signed)
Moderate persistent asthma with flare Poor HFA technique GERD playing a role and atopy Plan Take prednisone 10mg  Take 4 for two days three for two days two for two days one for two days Increase flonase two spray ea nostril twice daily Take pepcid famotidine 40mg  at bedtime and stop prilosec Stay on inhalers Return 4 months

## 2016-07-07 NOTE — Assessment & Plan Note (Signed)
Failed PPI d/t side effects Trial qhs pepcid 40mg 

## 2016-07-07 NOTE — Assessment & Plan Note (Signed)
Complete current ABX Rx from ED

## 2016-07-14 ENCOUNTER — Other Ambulatory Visit: Payer: Self-pay | Admitting: Family Medicine

## 2016-07-14 DIAGNOSIS — M25561 Pain in right knee: Principal | ICD-10-CM

## 2016-07-14 DIAGNOSIS — G8929 Other chronic pain: Secondary | ICD-10-CM

## 2016-07-14 DIAGNOSIS — M25572 Pain in left ankle and joints of left foot: Secondary | ICD-10-CM

## 2016-07-14 MED FILL — $VENTOLIN HFA 18G INHALER: 108 (90 BAS | 24 days supply | Qty: 18 | Fill #3

## 2016-07-14 MED FILL — DICLOFENAC SOD DR 75 MG TAB: 75 | 30 days supply | Qty: 60 | Fill #4

## 2016-07-14 MED FILL — ?CETIRIZINE HCL 10 MG TABLE: 10 | 30 days supply | Qty: 30 | Fill #3

## 2016-07-14 MED FILL — tiZANidine HCL 4 MG TABS: 4 | 30 days supply | Qty: 120 | Fill #0

## 2016-07-14 MED FILL — ACETAMINOPHEN/COD #3 TABLET: 300-30 | 30 days supply | Qty: 120 | Fill #1

## 2016-07-14 MED FILL — $Symbicort 160-4.5mcg/act: 160-4.5 | 20 days supply | Qty: 1 | Fill #2

## 2016-07-21 ENCOUNTER — Encounter: Payer: Self-pay | Admitting: Family Medicine

## 2016-07-21 ENCOUNTER — Encounter: Payer: Self-pay | Admitting: Critical Care Medicine

## 2016-07-21 NOTE — Telephone Encounter (Signed)
Patient FU information. Patient also requested more prednisone from Dr. Delford Field which I am awaiting advice on.

## 2016-07-21 NOTE — Telephone Encounter (Signed)
Patient is stating the symptoms returned after a few days of completing the prednisone.

## 2016-07-21 NOTE — Telephone Encounter (Signed)
Patient information.

## 2016-07-22 ENCOUNTER — Other Ambulatory Visit: Payer: Self-pay | Admitting: *Deleted

## 2016-07-22 MED ORDER — PREDNISONE 10 MG PO TABS: ORAL_TABLET | ORAL | 0 refills | Status: DC

## 2016-07-22 NOTE — Telephone Encounter (Signed)
Patients prednisone has been refilled with 30 quantity for dispense. Patient instructed to take 4 for 3 days, 3 for 3 days, two for 3 days and 1 for 3 days and STOP.

## 2016-07-22 NOTE — Telephone Encounter (Signed)
Please schedule the patient on Dr. Florene Route next schedule and inform her of prednisone refill being sent to the pharmacy,

## 2016-07-25 ENCOUNTER — Encounter (HOSPITAL_BASED_OUTPATIENT_CLINIC_OR_DEPARTMENT_OTHER): Payer: Self-pay

## 2016-07-25 ENCOUNTER — Emergency Department (HOSPITAL_BASED_OUTPATIENT_CLINIC_OR_DEPARTMENT_OTHER)
Admission: EM | Admit: 2016-07-25 | Discharge: 2016-07-25 | Disposition: A | Discharge status: Home / Self Care | Payer: Medicaid Other | Attending: Emergency Medicine | Admitting: Emergency Medicine

## 2016-07-25 ENCOUNTER — Telehealth: Payer: Self-pay | Admitting: Family Medicine

## 2016-07-25 DIAGNOSIS — M25572 Pain in left ankle and joints of left foot: Secondary | ICD-10-CM

## 2016-07-25 DIAGNOSIS — N898 Other specified noninflammatory disorders of vagina: Secondary | ICD-10-CM | POA: Insufficient documentation

## 2016-07-25 DIAGNOSIS — Z79899 Other long term (current) drug therapy: Secondary | ICD-10-CM | POA: Insufficient documentation

## 2016-07-25 DIAGNOSIS — M25561 Pain in right knee: Secondary | ICD-10-CM

## 2016-07-25 DIAGNOSIS — J45909 Unspecified asthma, uncomplicated: Secondary | ICD-10-CM | POA: Insufficient documentation

## 2016-07-25 DIAGNOSIS — R102 Pelvic and perineal pain: Secondary | ICD-10-CM

## 2016-07-25 DIAGNOSIS — G8929 Other chronic pain: Secondary | ICD-10-CM

## 2016-07-25 LAB — PREGNANCY, URINE: Preg Test, Ur: NEGATIVE

## 2016-07-25 MED ORDER — BENZOCAINE-RESORCINOL 5-2 % VA CREA: TOPICAL_CREAM | Freq: Every day | VAGINAL | 0 refills | Status: DC

## 2016-07-25 MED ORDER — OMEPRAZOLE 20 MG PO CPDR: 20.0000 mg | DELAYED_RELEASE_CAPSULE | Freq: Every day | ORAL | 5 refills | Status: DC

## 2016-07-25 MED ORDER — PANTOPRAZOLE SODIUM 40 MG PO TBEC: 40.0000 mg | DELAYED_RELEASE_TABLET | Freq: Every day | ORAL | 3 refills | Status: DC

## 2016-07-25 MED ORDER — ACETAMINOPHEN-CODEINE #3 300-30 MG PO TABS: 1.0000 | ORAL_TABLET | Freq: Four times a day (QID) | ORAL | 2 refills | Status: DC | PRN

## 2016-07-25 MED ORDER — LIDOCAINE HCL 2 % EX GEL: 1.0000 "application " | Freq: Four times a day (QID) | CUTANEOUS | 0 refills | Status: DC | PRN

## 2016-07-25 MED ORDER — ACETAMINOPHEN-CODEINE #4 300-60 MG PO TABS: 1.0000 | ORAL_TABLET | Freq: Four times a day (QID) | ORAL | 2 refills | Status: DC | PRN

## 2016-07-25 NOTE — Telephone Encounter (Signed)
Patient called the office to speak with nurse regarding the pain that she has been experiencing on her pelvic area since Friday. Please follow up.  Thank you.

## 2016-07-25 NOTE — ED Triage Notes (Signed)
C/o pain to external vaginal area-started last Thursday after taking "too hot shower" on Wednesday-steady gait-NAD

## 2016-07-25 NOTE — Telephone Encounter (Signed)
Patient called.

## 2016-07-25 NOTE — ED Provider Notes (Signed)
[facility]   CSN: [id number] Arrival date & time: 07/25/16  1258     History   Chief Complaint Chief Complaint  Patient presents with  . Vaginal Pain    HPI Courtney Zamora is a 31 y.o. female.  The history is provided by the patient and medical records.  Vaginal Pain     31 year old female with history of anxiety, asthma, obesity, seizure disorder, presenting to the ED for vaginal irritation. Patient reports she was outside in the cold all day on Thursday. States she came inside to get warm and decided to take a hot shower. States water felt fine until she went to wash her vaginal region and states it felt as if she burned herself.  States she turned the water temperature down and finished showering.  States Thursday evening and into Friday morning she noticed some irritation, this was worse by sitting in the car and riding around with her husband. States she continues to have some irritation along the left side of her vagina. She denies any new rash or lesions. She's not had any discharge or abnormal bleeding. No internal pelvic pain. No urinary symptoms. No new soaps or detergents. States she has not tried any topical or oral medications for her symptoms.  Of note, patient reports she is actively trying to get pregnant. She reports she and her husband have been trying for 4 years without success. She is requesting pregnancy test here.  Past Medical History:  Diagnosis Date  . Anxiety   . Asthma   . Obesity   . Obesity   . Seizures [facility])     Patient Active Problem List   Diagnosis Date Noted  . Acute bronchitis 04/22/2016  . Weight gain 01/12/2016  . Esophageal reflux 12/29/2015  . Morbid obesity (HCC) 12/29/2015  . Chronic pain of right knee 12/29/2015  . Chronic pain of left ankle 12/29/2015  . 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 Asthma, moderate persistent 12/09/2015    Past Surgical History:  Procedure Laterality Date  . TONSILLECTOMY       OB History    Gravida Para Term Preterm AB Living   1       1 0   SAB TAB Ectopic Multiple Live Births   1               Home Medications    Prior to Admission medications   Medication Sig Start Date End Date Taking? Authorizing Provider  acetaminophen-codeine (TYLENOL #3) 300-30 MG tablet Take 1 tablet by mouth every 6 (six) hours as needed. for pain 06/16/16   Dessa Phi, MD  acetaminophen-codeine (TYLENOL #4) 300-60 MG tablet Take 1 tablet by mouth every 6 (six) hours as needed for moderate pain. 06/30/16   Josalyn Funches, MD  albuterol (PROVENTIL HFA;VENTOLIN HFA) 108 (90 Base) MCG/ACT inhaler Inhale 2 puffs into the lungs every 6 (six) hours as needed for wheezing or shortness of breath (wheezing). For shortness of breath. 03/18/16   Josalyn Funches, MD  budesonide-formoterol (SYMBICORT) 160-4.5 MCG/ACT inhaler Inhale 2 puffs into the lungs 2 (two) times daily. 03/18/16   Josalyn Funches, MD  cetirizine (ZYRTEC) 10 MG tablet Take 1 tablet (10 mg total) by mouth daily. 03/18/16   Dessa Phi, MD  diclofenac (VOLTAREN) 75 MG EC tablet Take 1 tablet (75 mg total) by mouth 2 (two) times daily. Take with food 03/18/16   Dessa Phi, MD  Elastic Bandages & Supports (MEDICAL COMPRESSION SOCKS) MISC 1  each by Does not apply route daily. Knee high compression stockings 30 mmHg pressure Patient not taking: Reported on 07/06/2016 03/18/16   Dessa Phi, MD  fluticasone (FLONASE) 50 MCG/ACT nasal spray Place 2 sprays into both nostrils 2 (two) times daily. 07/06/16   Storm Frisk, MD  gabapentin (NEURONTIN) 300 MG capsule Take 2 capsules (600 mg total) by mouth at bedtime. 06/29/16   Josalyn Funches, MD  omeprazole (PRILOSEC) 20 MG capsule Take 1 capsule (20 mg total) by mouth daily. 07/25/16   Josalyn Funches, MD  predniSONE (DELTASONE) 10 MG tablet Take 4 for three days 3 for three days 2 for three days 1 for three days STOP 07/22/16   Storm Frisk, MD  tiZANidine (ZANAFLEX) 4  MG tablet TAKE 1 TABLET BY MOUTH EVERY 6 HOURS AS NEEDED FOR MUSCLE SPASMS. 07/14/16   Dessa Phi, MD    Family History Family History  Problem Relation Age of Onset  . Hypothyroidism Mother   . Diabetes Father   . Diabetes Paternal Uncle   . Hypertension Maternal Grandmother   . Hypertension Maternal Grandfather   . Hypertension Paternal Grandmother   . Heart disease Paternal Grandfather   . Hypertension Paternal Grandfather     Social History Social History  Substance Use Topics  . Smoking status: Never Smoker  . Smokeless tobacco: Never Used  . Alcohol use No     Allergies   Bee venom; Cinnamon; and Ibuprofen   Review of Systems Review of Systems  Genitourinary: Positive for vaginal pain (irritation).  All other systems reviewed and are negative.    Physical Exam Updated Vital Signs BP 138/84 (BP Location: Right Arm)   Pulse 101   Temp 98.4 F (36.9 C) (Oral)   Resp 20   Ht 5\' 4"  (1.626 m)   Wt (!) 159.2 kg   LMP 07/09/2016   SpO2 100%   BMI 60.25 kg/m   Physical Exam  Constitutional: She is oriented to person, place, and time. She appears well-developed and well-nourished.  HENT:  Head: Normocephalic and atraumatic.  Mouth/Throat: Oropharynx is clear and moist.  Eyes: Conjunctivae and EOM are normal. Pupils are equal, round, and reactive to light.  Neck: Normal range of motion.  Cardiovascular: Normal rate, regular rhythm and normal heart sounds.   Pulmonary/Chest: Effort normal and breath sounds normal. No respiratory distress. She has no wheezes.  Abdominal: Soft. Bowel sounds are normal. There is no tenderness. There is no rebound.  Genitourinary:  Genitourinary Comments: Normal female external genitalia without visible lesions or rash; there does appear to be some irritation of the left labia majora along the inferior aspect; there is no discrete burn or blister noted; no apparent discharge  Musculoskeletal: Normal range of motion.    Neurological: She is alert and oriented to person, place, and time.  Skin: Skin is warm and dry.  Psychiatric: She has a normal mood and affect.  Nursing note and vitals reviewed.    ED Treatments / Results  Labs (all labs ordered are listed, but only abnormal results are displayed) Labs Reviewed  PREGNANCY, URINE    EKG  EKG Interpretation None       Radiology No results found.  Procedures Procedures (including critical care time)  Medications Ordered in ED Medications - No data to display   Initial Impression / Assessment and Plan / ED Course  I have reviewed the triage vital signs and the nursing notes.  Pertinent labs & imaging results that were available  during my care of the patient were reviewed by me and considered in my medical decision making (see chart for details).  Clinical Course    31 year old female here with vaginal irritation after taking a hot shower a few days ago. Complains of continued vaginal irritation along the left side. On exam there is some apparent skin irritation along the left labia majora without discrete rash, lesions, or apparent burn.  Patient denies vaginal bleeding or discharge. Will treat with hydrocele and viscous lidocaine for discomfort. She does not currently see an OB/GYN, so was referred to [facility] clinic for follow-up.  Of note, patient requested pregnancy test here which is negative.  Patient acknowledged understanding and agreed with plan of care. She was given return precautions for any new or worsening symptoms.  Final Clinical Impressions(s) / ED Diagnoses   Final diagnoses:  Vaginal irritation    New Prescriptions New Prescriptions   BENZOCAINE-RESORCINOL (VAGISIL) 5-2 % VAGINAL CREAM    Place vaginally at bedtime.   LIDOCAINE (XYLOCAINE) 2 % JELLY    Apply 1 application topically every 6 (six) hours as needed.     Garlon Hatchet, PA-C 07/25/16 1505    Benjiman Core, MD 07/26/16 2259

## 2016-07-25 NOTE — Telephone Encounter (Signed)
Pt states she went to the ED and was seen and treated but does not feel like the diagnosis matches her sypmtoms. Stated she would like to f/u with the discomfort and pain with 1st available provider with the exception of a female provider. Pt informed that there were no appointment available. Encouraged to call back for possible cancellation on schedule. Pt stated she was given referral to [facility]. Encouraged to f/u with first provider she can get in with.   Pt also request that medication for GERD be called into pharmacy. Does not do well with Prilosec or Pepcid doses that was called to pharmacy before. Reflux bothers her asthma would like to try something different.   She requested pain medicaiton: Tylenol #4 for pain. Pls, advise. Damita Dunnings

## 2016-07-25 NOTE — Telephone Encounter (Signed)
Patient is requesting a refill on Tylenol 3 which was last refilled by you on 06/30/16

## 2016-07-25 NOTE — Telephone Encounter (Signed)
Called patient Verified name and DOB  Changed Pepcid to Protonix Refilled tylenol #3 and #4  6-7/10 pain in L vulvar area for past 3 days. superior pain. She went to [place] ED. Dx with burn. She does not believe she is having a burn. She has pain in vulvar that radiates inward to L pelvis.,  Plan: Pelvic and TVUS F/u appt for pelvic exam and pap smear Go to [facility] if pain becomes severe  She agrees with plan and voices understanding

## 2016-07-25 NOTE — Discharge Instructions (Signed)
Take the prescribed medication as directed. Follow-up with the women's clinic for any ongoing issues-- you may call to make appt or have your primary care doctor refer you. Return to the ED for new or worsening symptoms.

## 2016-07-25 NOTE — Telephone Encounter (Signed)
Patient is stating she has used that alternative already.

## 2016-07-25 NOTE — ED Triage Notes (Addendum)
Pt states last 2 menstrual cycles have been late and she is actively trying to get pregnant-upreg ordered

## 2016-07-27 NOTE — Telephone Encounter (Signed)
Called pt. To verify appt. And she states that her nurse was going to call her to schedule a ultrasound appt. Please f/u

## 2016-07-28 ENCOUNTER — Ambulatory Visit: Payer: Self-pay | Attending: Family Medicine | Admitting: Family Medicine

## 2016-07-28 ENCOUNTER — Encounter: Payer: Self-pay | Admitting: Family Medicine

## 2016-07-28 VITALS — BP 160/81 | HR 74 | Temp 98.0°F | Ht 64.0 in | Wt 351.2 lb

## 2016-07-28 DIAGNOSIS — Z124 Encounter for screening for malignant neoplasm of cervix: Secondary | ICD-10-CM

## 2016-07-28 DIAGNOSIS — Z79899 Other long term (current) drug therapy: Secondary | ICD-10-CM | POA: Insufficient documentation

## 2016-07-28 DIAGNOSIS — Z7951 Long term (current) use of inhaled steroids: Secondary | ICD-10-CM | POA: Insufficient documentation

## 2016-07-28 DIAGNOSIS — R102 Pelvic and perineal pain: Secondary | ICD-10-CM

## 2016-07-28 LAB — POCT URINALYSIS DIPSTICK
Blood, UA: NEGATIVE
GLUCOSE UA: NEGATIVE
Nitrite, UA: NEGATIVE
SPEC GRAV UA: 1.02
Urobilinogen, UA: 1
pH, UA: 7

## 2016-07-28 LAB — POCT URINE PREGNANCY: PREG TEST UR: NEGATIVE

## 2016-07-28 MED ORDER — METRONIDAZOLE 500 MG PO TABS: 500.0000 mg | ORAL_TABLET | Freq: Two times a day (BID) | ORAL | 0 refills | Status: DC

## 2016-07-28 MED ORDER — FLUCONAZOLE 150 MG PO TABS: 150.0000 mg | ORAL_TABLET | ORAL | 0 refills | Status: DC

## 2016-07-28 MED FILL — PANTOPRAZOLE SOD DR 40 MG T: 40 | 30 days supply | Qty: 30 | Fill #0

## 2016-07-28 MED FILL — FLUCONAZOLE 150 MG TABLET: 150 | 2 days supply | Qty: 2 | Fill #0

## 2016-07-28 MED FILL — ?METRONIDAZOLE 500 MG TABLE: 500 | 7 days supply | Qty: 14 | Fill #0

## 2016-07-28 NOTE — Progress Notes (Signed)
Subjective:  Patient ID: Courtney Zamora, female    DOB: [birth year]  Age: 31 y.o. MRN: [id number]  CC: Pelvic Pain   HPI Courtney Zamora presents for    1. Pelvic pain: pain started on 07/23/2015. Left vulvar pain with sharp pains radiating to L pelvis. She went to ED on 07/25/2016 was informed that her pain was related to being scalded by hot water. She admits to using a detachable shower head with very hot water in her groin area prior to onset of pain. She denies fever, chills and vaginal discharge. No sores or lesions in genital area.   Social History  Substance Use Topics  . Smoking status: Never Smoker  . Smokeless tobacco: Never Used  . Alcohol use No    Outpatient Medications Prior to Visit  Medication Sig Dispense Refill  . acetaminophen-codeine (TYLENOL #3) 300-30 MG tablet Take 1 tablet by mouth every 6 (six) hours as needed. for pain 90 tablet 2  . acetaminophen-codeine (TYLENOL #4) 300-60 MG tablet Take 1 tablet by mouth every 6 (six) hours as needed for moderate pain. 45 tablet 2  . albuterol (PROVENTIL HFA;VENTOLIN HFA) 108 (90 Base) MCG/ACT inhaler Inhale 2 puffs into the lungs every 6 (six) hours as needed for wheezing or shortness of breath (wheezing). For shortness of breath. 54 Inhaler 3  . budesonide-formoterol (SYMBICORT) 160-4.5 MCG/ACT inhaler Inhale 2 puffs into the lungs 2 (two) times daily. 3 Inhaler 3  . cetirizine (ZYRTEC) 10 MG tablet Take 1 tablet (10 mg total) by mouth daily. 30 tablet 11  . diclofenac (VOLTAREN) 75 MG EC tablet Take 1 tablet (75 mg total) by mouth 2 (two) times daily. Take with food 180 tablet 1  . fluticasone (FLONASE) 50 MCG/ACT nasal spray Place 2 sprays into both nostrils 2 (two) times daily. 16 g 6  . gabapentin (NEURONTIN) 300 MG capsule Take 2 capsules (600 mg total) by mouth at bedtime. 60 capsule 3  . pantoprazole (PROTONIX) 40 MG tablet Take 1 tablet (40 mg total) by mouth daily. 30 tablet 3  . predniSONE (DELTASONE) 10 MG tablet  Take 4 for three days 3 for three days 2 for three days 1 for three days STOP 30 tablet 0  . tiZANidine (ZANAFLEX) 4 MG tablet TAKE 1 TABLET BY MOUTH EVERY 6 HOURS AS NEEDED FOR MUSCLE SPASMS. 120 tablet 0  . benzocaine-resorcinol (VAGISIL) 5-2 % vaginal cream Place vaginally at bedtime. (Patient not taking: Reported on 07/28/2016) 60 g 0  . Elastic Bandages & Supports (MEDICAL COMPRESSION SOCKS) MISC 1 each by Does not apply route daily. Knee high compression stockings 30 mmHg pressure (Patient not taking: Reported on 07/28/2016) 2 each 0  . lidocaine (XYLOCAINE) 2 % jelly Apply 1 application topically every 6 (six) hours as needed. (Patient not taking: Reported on 07/28/2016) 30 mL 0   No facility-administered medications prior to visit.     ROS Review of Systems  Constitutional: Negative for chills and fever.  Eyes: Negative for visual disturbance.  Respiratory: Negative for shortness of breath.   Cardiovascular: Negative for chest pain.  Gastrointestinal: Negative for abdominal pain and blood in stool.  Genitourinary: Positive for pelvic pain.  Musculoskeletal: Negative for arthralgias and back pain.  Skin: Negative for rash.  Allergic/Immunologic: Negative for immunocompromised state.  Hematological: Negative for adenopathy. Does not bruise/bleed easily.  Psychiatric/Behavioral: Negative for dysphoric mood and suicidal ideas.    Objective:  BP (!) 160/81 (BP Location: Left Wrist, Patient Position: Sitting, Cuff  Size: Small)   Pulse 74   Temp 98 F (36.7 C) (Oral)   Ht 5\' 4"  (1.626 m)   Wt (!) 351 lb 3.2 oz (159.3 kg)   LMP 07/09/2016   SpO2 100%   BMI 60.28 kg/m   BP/Weight 07/28/2016 07/25/2016 07/06/2016  Systolic BP 160 157 129  Diastolic BP 81 106 81  Wt. (Lbs) 351.2 351 348.3  BMI 60.28 60.25 59.79    Physical Exam  Constitutional: She is oriented to person, place, and time. She appears well-developed and well-nourished. No distress.  Morbidly obese   HENT:  Head:  Normocephalic and atraumatic.  Cardiovascular: Normal rate, regular rhythm, normal heart sounds and intact distal pulses.   Pulmonary/Chest: Effort normal and breath sounds normal.  Genitourinary: Vagina normal and uterus normal.    Pelvic exam was performed with patient prone. There is no rash, tenderness or lesion on the right labia. There is no rash, tenderness or lesion on the left labia. Cervix exhibits discharge (scant white discharge ). Cervix exhibits no motion tenderness and no friability.  Musculoskeletal: She exhibits no edema.  Lymphadenopathy:       Right: No inguinal adenopathy present.       Left: No inguinal adenopathy present.  Neurological: She is alert and oriented to person, place, and time.  Skin: Skin is warm and dry. No rash noted.  Psychiatric: She has a normal mood and affect.   U preg negative UA: clear, moderate LE, 30 protein   Assessment & Plan:  Tionne was seen today for pelvic pain.  Diagnoses and all orders for this visit:  Pap smear for cervical cancer screening -     Cytology - PAP  Vulvar pain -     POCT urinalysis dipstick -     POCT urine pregnancy -     metroNIDAZOLE (FLAGYL) 500 MG tablet; Take 1 tablet (500 mg total) by mouth 2 (two) times daily. -     fluconazole (DIFLUCAN) 150 MG tablet; Take 1 tablet (150 mg total) by mouth every 3 (three) days. -     Urine culture   There are no diagnoses linked to this encounter.  No orders of the defined types were placed in this encounter.   Follow-up: Return in about 2 weeks (around 08/11/2016) for vulvar pain .   08/13/2016 MD

## 2016-07-28 NOTE — Patient Instructions (Addendum)
Courtney Zamora was seen today for pelvic pain.  Diagnoses and all orders for this visit:  Pap smear for cervical cancer screening -     Cytology - PAP  Vulvar pain -     POCT urinalysis dipstick -     POCT urine pregnancy -     metroNIDAZOLE (FLAGYL) 500 MG tablet; Take 1 tablet (500 mg total) by mouth 2 (two) times daily. -     fluconazole (DIFLUCAN) 150 MG tablet; Take 1 tablet (150 mg total) by mouth every 3 (three) days.   While awaiting culture results Take flagyl 500 mg twice daily this while cover for possible BV which is a cause of vulvar pain After flagyl take diflucan to prevent yeast infection  Complete pelvic and transvaginal ultrasound  Take cools sitz baths to help relieve burning as well or use cooling wipes like tucks wipes used for hemorrhoids  F/u in  2 weeks for vulvar pain   Courtney Zamora

## 2016-07-28 NOTE — Assessment & Plan Note (Signed)
Vulvar pain on L side, symmetrical exam. No lesions.  Discharge concerning for BV  Treat with flagyl followed by diflucan Pelvic and TVUS ordered

## 2016-07-29 ENCOUNTER — Encounter: Payer: Self-pay | Admitting: Family Medicine

## 2016-07-29 ENCOUNTER — Ambulatory Visit (HOSPITAL_COMMUNITY): Payer: Medicaid Other

## 2016-07-29 LAB — URINE CULTURE: ORGANISM ID, BACTERIA: NO GROWTH

## 2016-07-29 LAB — CERVICOVAGINAL ANCILLARY ONLY: Wet Prep (BD Affirm): POSITIVE — AB

## 2016-07-29 NOTE — Telephone Encounter (Signed)
Patient requesting further explanation of Ancillary testing. Also the test were ran on the PAP because white swab was sent instead of the blue

## 2016-08-01 ENCOUNTER — Ambulatory Visit (HOSPITAL_COMMUNITY)
Admission: RE | Admit: 2016-08-01 | Discharge: 2016-08-01 | Disposition: A | Discharge status: Home / Self Care | Payer: Medicaid Other | Source: Ambulatory Visit | Attending: Family Medicine | Admitting: Family Medicine

## 2016-08-01 DIAGNOSIS — R102 Pelvic and perineal pain: Secondary | ICD-10-CM

## 2016-08-01 LAB — CYTOLOGY - PAP
CHLAMYDIA, DNA PROBE: NEGATIVE
DIAGNOSIS: UNDETERMINED — AB
HPV: DETECTED — AB
NEISSERIA GONORRHEA: NEGATIVE

## 2016-08-02 ENCOUNTER — Telehealth: Payer: Self-pay | Admitting: Family Medicine

## 2016-08-02 DIAGNOSIS — R102 Pelvic and perineal pain: Secondary | ICD-10-CM

## 2016-08-02 DIAGNOSIS — R87610 Atypical squamous cells of undetermined significance on cytologic smear of cervix (ASC-US): Secondary | ICD-10-CM

## 2016-08-02 DIAGNOSIS — R87810 Cervical high risk human papillomavirus (HPV) DNA test positive: Secondary | ICD-10-CM

## 2016-08-02 NOTE — Telephone Encounter (Signed)
Called patient Verified name and DOB  Gave pap results There are atypical cellular of undetermined significance. This means there are some changes to the cervix cells that could potentially progress to cancer, but are not cancerous at the moment.  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  The next steps is to take a look at the cervix under a high powered microscope called a colposcope to look for any visible areas of cellular abnormality that may need to be biopsied. The is called a colposcopy and is done by a gynecology.  I have referred you to gynecology.   She had no questions  She is still have severe L sided pelvic pain that is worse at night. Almost bring her to tears. Tolerable during the day.  Pelvic and transvaginal ultrasounds were negative. She denies back pain.  CT abdomen and pelvis without contrast ordered Please schedule CT and call patient with appointment details.

## 2016-08-03 ENCOUNTER — Ambulatory Visit: Payer: Self-pay | Admitting: Critical Care Medicine

## 2016-08-05 ENCOUNTER — Encounter (INDEPENDENT_AMBULATORY_CARE_PROVIDER_SITE_OTHER): Payer: Self-pay | Admitting: Orthopaedic Surgery

## 2016-08-08 ENCOUNTER — Other Ambulatory Visit: Payer: Self-pay | Admitting: Critical Care Medicine

## 2016-08-08 NOTE — Telephone Encounter (Signed)
Patient requesting a refill on prednisone

## 2016-08-09 ENCOUNTER — Encounter: Payer: Self-pay | Admitting: Family Medicine

## 2016-08-09 ENCOUNTER — Encounter: Payer: Self-pay | Admitting: Student

## 2016-08-09 ENCOUNTER — Telehealth (INDEPENDENT_AMBULATORY_CARE_PROVIDER_SITE_OTHER): Payer: Self-pay | Admitting: *Deleted

## 2016-08-09 DIAGNOSIS — S99912A Unspecified injury of left ankle, initial encounter: Secondary | ICD-10-CM

## 2016-08-09 NOTE — Telephone Encounter (Signed)
Pt called states she is having severe pain and think she has a fracture on foot. Pt was advised to f/up with urgent care or ED

## 2016-08-09 NOTE — Telephone Encounter (Signed)
Pt called stating she from Ryan at [facility] and stated he called her and told her they did not accept her insurance Medicaid, which pt states is only for family planning, pt states that Alycia Rossetti got "firm" with her stating did not accept it insurance and so she was DENIED. Pt wants me to call [facility] and check on insurance and see if they accept the [facility] discount card which is at 100%. I called and left message with Noreene Larsson with scheduling to return my call.

## 2016-08-09 NOTE — Telephone Encounter (Signed)
Patient concern

## 2016-08-10 NOTE — Telephone Encounter (Signed)
Patient emailed me. Wanted to check the status of pain clinic. I advised her it was pending appt. She states they said she was Denied. Do you know anything new?

## 2016-08-10 NOTE — Telephone Encounter (Signed)
Ct scan with contrast is what needs to be ordered for this patient. Please change and I will call and schedule appointment.

## 2016-08-10 NOTE — Telephone Encounter (Signed)
Pending call back from [facility]

## 2016-08-11 NOTE — Telephone Encounter (Signed)
Patient response

## 2016-08-11 NOTE — Telephone Encounter (Signed)
My chart message sent to patient.    CT abdomen and pelvis with contrast has been recommended by the radiologist  to evaluate pelvic pain.   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

## 2016-08-12 ENCOUNTER — Encounter: Payer: Self-pay | Admitting: Family Medicine

## 2016-08-12 ENCOUNTER — Ambulatory Visit (HOSPITAL_COMMUNITY): Payer: Medicaid Other

## 2016-08-12 ENCOUNTER — Ambulatory Visit: Payer: Medicaid Other | Attending: Family Medicine | Admitting: Family Medicine

## 2016-08-12 VITALS — BP 165/85 | HR 77 | Temp 98.1°F | Ht 64.0 in | Wt 355.8 lb

## 2016-08-12 DIAGNOSIS — Z6841 Body Mass Index (BMI) 40.0 and over, adult: Secondary | ICD-10-CM | POA: Insufficient documentation

## 2016-08-12 DIAGNOSIS — L298 Other pruritus: Secondary | ICD-10-CM

## 2016-08-12 DIAGNOSIS — G8929 Other chronic pain: Secondary | ICD-10-CM | POA: Diagnosis not present

## 2016-08-12 DIAGNOSIS — R87810 Cervical high risk human papillomavirus (HPV) DNA test positive: Secondary | ICD-10-CM

## 2016-08-12 DIAGNOSIS — N760 Acute vaginitis: Secondary | ICD-10-CM | POA: Insufficient documentation

## 2016-08-12 DIAGNOSIS — B9689 Other specified bacterial agents as the cause of diseases classified elsewhere: Secondary | ICD-10-CM | POA: Insufficient documentation

## 2016-08-12 DIAGNOSIS — M79672 Pain in left foot: Secondary | ICD-10-CM | POA: Insufficient documentation

## 2016-08-12 DIAGNOSIS — I10 Essential (primary) hypertension: Secondary | ICD-10-CM | POA: Insufficient documentation

## 2016-08-12 DIAGNOSIS — M25572 Pain in left ankle and joints of left foot: Secondary | ICD-10-CM | POA: Insufficient documentation

## 2016-08-12 DIAGNOSIS — J45909 Unspecified asthma, uncomplicated: Secondary | ICD-10-CM | POA: Insufficient documentation

## 2016-08-12 DIAGNOSIS — N926 Irregular menstruation, unspecified: Secondary | ICD-10-CM | POA: Diagnosis not present

## 2016-08-12 DIAGNOSIS — R87610 Atypical squamous cells of undetermined significance on cytologic smear of cervix (ASC-US): Secondary | ICD-10-CM | POA: Diagnosis not present

## 2016-08-12 DIAGNOSIS — N898 Other specified noninflammatory disorders of vagina: Secondary | ICD-10-CM

## 2016-08-12 DIAGNOSIS — R102 Pelvic and perineal pain: Secondary | ICD-10-CM

## 2016-08-12 DIAGNOSIS — Z7952 Long term (current) use of systemic steroids: Secondary | ICD-10-CM

## 2016-08-12 LAB — POCT URINE PREGNANCY: Preg Test, Ur: NEGATIVE

## 2016-08-12 MED ORDER — FLUCONAZOLE 150 MG PO TABS: 150.0000 mg | ORAL_TABLET | Freq: Every day | ORAL | 0 refills | Status: DC

## 2016-08-12 MED ORDER — PREDNISONE 10 MG PO TABS: ORAL_TABLET | ORAL | 0 refills | Status: DC

## 2016-08-12 MED FILL — FLUCONAZOLE 150 MG TABLET: 150 | 1 days supply | Qty: 1 | Fill #0

## 2016-08-12 NOTE — Progress Notes (Signed)
Subjective:  Patient ID: Courtney Zamora, female    DOB: 06-May-1986  Age: 31 y.o. MRN: [id number]  CC: Follow-up   HPI Latria L Yinger has morbid obesity, asthma,  Chronic knee pain and chronic left ankle pain she presents for   1. Vulvar pain: improved. Pelvic and transvaginal ultrasound was normal. Wet prep revealed BV. She has completed flagyl followed by 2 diflucan doses. She reports continued itching. She request test of cure. Pap smear revealed HPV positive ASCUS. She has been referred to gynecology for colposcopy.   2. Left foot pain: left lateral foot pain started after slipping in mud on 08/08/2016. There is no bruising or swelling. She is able to bear weight. She has pain with stepping.   3. Late period: her period is 5 days late. She has history of irregular menses, obese. Normal ovaries on pelvic and TVUS. Negative urine glucose. We have been unable to assess blood glucose due severe needle phobia. She hopes to be pregnant. She has been trying for 13 years with 2 different partners with only 1 positive home pregnancy test in the past with all office/ED/UC pregnancy test being negative.    4. HTN: BP has been persistently elevated over the last month. She has intermittent HTN in the past. She continues to gain weight.   Social History  Substance Use Topics  . Smoking status: Never Smoker  . Smokeless tobacco: Never Used  . Alcohol use No    Outpatient Medications Prior to Visit  Medication Sig Dispense Refill  . acetaminophen-codeine (TYLENOL #3) 300-30 MG tablet Take 1 tablet by mouth every 6 (six) hours as needed. for pain 90 tablet 2  . acetaminophen-codeine (TYLENOL #4) 300-60 MG tablet Take 1 tablet by mouth every 6 (six) hours as needed for moderate pain. 45 tablet 2  . albuterol (PROVENTIL HFA;VENTOLIN HFA) 108 (90 Base) MCG/ACT inhaler Inhale 2 puffs into the lungs every 6 (six) hours as needed for wheezing or shortness of breath (wheezing). For shortness of breath.  54 Inhaler 3  . budesonide-formoterol (SYMBICORT) 160-4.5 MCG/ACT inhaler Inhale 2 puffs into the lungs 2 (two) times daily. 3 Inhaler 3  . cetirizine (ZYRTEC) 10 MG tablet Take 1 tablet (10 mg total) by mouth daily. 30 tablet 11  . diclofenac (VOLTAREN) 75 MG EC tablet Take 1 tablet (75 mg total) by mouth 2 (two) times daily. Take with food 180 tablet 1  . fluconazole (DIFLUCAN) 150 MG tablet Take 1 tablet (150 mg total) by mouth every 3 (three) days. 2 tablet 0  . fluticasone (FLONASE) 50 MCG/ACT nasal spray Place 2 sprays into both nostrils 2 (two) times daily. 16 g 6  . gabapentin (NEURONTIN) 300 MG capsule Take 2 capsules (600 mg total) by mouth at bedtime. 60 capsule 3  . metroNIDAZOLE (FLAGYL) 500 MG tablet Take 1 tablet (500 mg total) by mouth 2 (two) times daily. 14 tablet 0  . pantoprazole (PROTONIX) 40 MG tablet Take 1 tablet (40 mg total) by mouth daily. 30 tablet 3  . predniSONE (DELTASONE) 10 MG tablet Take 4 for three days 3 for three days 2 for three days 1 for three days STOP 21 tablet 0  . tiZANidine (ZANAFLEX) 4 MG tablet TAKE 1 TABLET BY MOUTH EVERY 6 HOURS AS NEEDED FOR MUSCLE SPASMS. 120 tablet 0  . benzocaine-resorcinol (VAGISIL) 5-2 % vaginal cream Place vaginally at bedtime. (Patient not taking: Reported on 07/28/2016) 60 g 0  . Elastic Bandages & Supports (MEDICAL COMPRESSION SOCKS)  MISC 1 each by Does not apply route daily. Knee high compression stockings 30 mmHg pressure (Patient not taking: Reported on 07/28/2016) 2 each 0  . lidocaine (XYLOCAINE) 2 % jelly Apply 1 application topically every 6 (six) hours as needed. (Patient not taking: Reported on 07/28/2016) 30 mL 0   No facility-administered medications prior to visit.     ROS Review of Systems  Constitutional: Negative for chills and fever.  Eyes: Negative for visual disturbance.  Respiratory: Negative for shortness of breath.   Cardiovascular: Negative for chest pain.  Gastrointestinal: Negative for abdominal  pain and blood in stool.  Genitourinary: Positive for pelvic pain (improving ).  Musculoskeletal: Positive for arthralgias and gait problem. Negative for back pain.  Skin: Negative for rash.  Allergic/Immunologic: Negative for immunocompromised state.  Hematological: Negative for adenopathy. Does not bruise/bleed easily.  Psychiatric/Behavioral: Negative for dysphoric mood and suicidal ideas.    Objective:  BP (!) 165/85 (BP Location: Left Arm, Patient Position: Sitting, Cuff Size: Small)   Pulse 77   Temp 98.1 F (36.7 C) (Oral)   Ht 5\' 4"  (1.626 m)   Wt (!) 355 lb 12.8 oz (161.4 kg)   LMP 07/09/2016   SpO2 100%   BMI 61.07 kg/m   BP/Weight 08/12/2016 07/28/2016 07/25/2016  Systolic BP 165 160 157  Diastolic BP 85 81 106  Wt. (Lbs) 355.8 351.2 351  BMI 61.07 60.28 60.25    Physical Exam  Constitutional: She appears well-developed and well-nourished. No distress.  Cardiovascular: Normal rate, regular rhythm, normal heart sounds and intact distal pulses.   Pulmonary/Chest: Effort normal and breath sounds normal.  Genitourinary: Uterus normal. Pelvic exam was performed with patient prone. There is no rash, tenderness or lesion on the right labia. There is no rash, tenderness or lesion on the left labia. Cervix exhibits no motion tenderness, no discharge and no friability. Vaginal discharge (scant white ) found.  Musculoskeletal: She exhibits no edema.       Feet:  Lymphadenopathy:       Right: No inguinal adenopathy present.       Left: No inguinal adenopathy present.  Skin: Skin is warm and dry. No rash noted.    U preg: negative  Assessment & Plan:   Cheyne was seen today for follow-up.  Diagnoses and all orders for this visit:  Left foot pain -     DG Foot 2 Views Left; Future  Missed period -     POCT urine pregnancy  Vaginal itching -     Discontinue: fluconazole (DIFLUCAN) 150 MG tablet; Take 1 tablet (150 mg total) by mouth daily. -     fluconazole (DIFLUCAN)  150 MG tablet; Take 1 tablet (150 mg total) by mouth daily.  BV (bacterial vaginosis) -     Cervicovaginal ancillary only  ASCUS with positive high risk HPV cervical    No orders of the defined types were placed in this encounter.   Follow-up: No Follow-up on file.   Demetrio Lapping MD

## 2016-08-12 NOTE — Patient Instructions (Addendum)
Magalie was seen today for follow-up.  Diagnoses and all orders for this visit:  Possible pregnancy -     POCT urine pregnancy  Left foot pain -     DG Foot 2 Views Left; Future  Vaginal itching -     fluconazole (DIFLUCAN) 150 MG tablet; Take 1 tablet (150 mg total) by mouth daily.  BV (bacterial vaginosis) -     Cervicovaginal ancillary only   Urine is negative today  You will be called with wet prep and x-ray results  F/u in 2 months for left foot pain   Dr. Armen Pickup

## 2016-08-12 NOTE — Progress Notes (Signed)
Pt is here today for vulvar pain. Pt states that pain is getting better. Pt is having pain in pinky toe on left foot.

## 2016-08-14 ENCOUNTER — Encounter: Payer: Self-pay | Admitting: Family Medicine

## 2016-08-14 DIAGNOSIS — R87810 Cervical high risk human papillomavirus (HPV) DNA test positive: Secondary | ICD-10-CM

## 2016-08-14 DIAGNOSIS — M79672 Pain in left foot: Secondary | ICD-10-CM | POA: Insufficient documentation

## 2016-08-14 DIAGNOSIS — I10 Essential (primary) hypertension: Secondary | ICD-10-CM | POA: Insufficient documentation

## 2016-08-14 DIAGNOSIS — Z7952 Long term (current) use of systemic steroids: Secondary | ICD-10-CM | POA: Insufficient documentation

## 2016-08-14 DIAGNOSIS — R87610 Atypical squamous cells of undetermined significance on cytologic smear of cervix (ASC-US): Secondary | ICD-10-CM | POA: Insufficient documentation

## 2016-08-14 MED ORDER — AMLODIPINE BESYLATE 5 MG PO TABS: 5.0000 mg | ORAL_TABLET | Freq: Every day | ORAL | 3 refills | Status: DC

## 2016-08-14 MED ORDER — VITAMIN D (ERGOCALCIFEROL) 1.25 MG (50000 UNIT) PO CAPS: 50000.0000 [IU] | ORAL_CAPSULE | ORAL | 3 refills | Status: DC

## 2016-08-14 MED ORDER — CALCIUM CITRATE 200 MG PO TABS: 400.0000 mg | ORAL_TABLET | Freq: Every day | ORAL | 11 refills | Status: DC

## 2016-08-14 NOTE — Assessment & Plan Note (Signed)
HTN  Obese Unable to obtains labs, but normal UA without proteinuria or hematuria  Plan: Start norvasc 5 mg daily

## 2016-08-14 NOTE — Assessment & Plan Note (Signed)
Able to bear weight Pain and tenderness after slip 4 days ago No bruising or deformity X-ray ordered

## 2016-08-14 NOTE — Assessment & Plan Note (Signed)
Improving.  No need for CT abdomen and pelvis She will still f/u with gyn for ASCUS pap

## 2016-08-14 NOTE — Assessment & Plan Note (Signed)
Gynecology referral already placed for colposcopy

## 2016-08-14 NOTE — Assessment & Plan Note (Signed)
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

## 2016-08-15 MED FILL — AMLODIPINE BESYLATE 5 MG TA: 5 | 30 days supply | Qty: 30 | Fill #0

## 2016-08-15 NOTE — Telephone Encounter (Signed)
Patient response

## 2016-08-16 LAB — CERVICOVAGINAL ANCILLARY ONLY: WET PREP (BD AFFIRM): NEGATIVE

## 2016-08-17 ENCOUNTER — Encounter: Payer: Self-pay | Admitting: Licensed Clinical Social Worker

## 2016-08-17 ENCOUNTER — Ambulatory Visit: Payer: Self-pay | Attending: Critical Care Medicine | Admitting: Critical Care Medicine

## 2016-08-17 ENCOUNTER — Encounter: Payer: Self-pay | Admitting: Critical Care Medicine

## 2016-08-17 VITALS — BP 143/81 | HR 68 | Temp 97.8°F | Wt 362.4 lb

## 2016-08-17 DIAGNOSIS — F419 Anxiety disorder, unspecified: Secondary | ICD-10-CM

## 2016-08-17 DIAGNOSIS — R87810 Cervical high risk human papillomavirus (HPV) DNA test positive: Secondary | ICD-10-CM

## 2016-08-17 DIAGNOSIS — J4540 Moderate persistent asthma, uncomplicated: Secondary | ICD-10-CM

## 2016-08-17 DIAGNOSIS — K219 Gastro-esophageal reflux disease without esophagitis: Secondary | ICD-10-CM

## 2016-08-17 DIAGNOSIS — F32A Depression, unspecified: Secondary | ICD-10-CM | POA: Insufficient documentation

## 2016-08-17 DIAGNOSIS — Z6841 Body Mass Index (BMI) 40.0 and over, adult: Secondary | ICD-10-CM | POA: Insufficient documentation

## 2016-08-17 DIAGNOSIS — F329 Major depressive disorder, single episode, unspecified: Secondary | ICD-10-CM

## 2016-08-17 DIAGNOSIS — R87610 Atypical squamous cells of undetermined significance on cytologic smear of cervix (ASC-US): Secondary | ICD-10-CM

## 2016-08-17 NOTE — Assessment & Plan Note (Signed)
Changes in diet to be made Focus on weight loss

## 2016-08-17 NOTE — Assessment & Plan Note (Signed)
Moderate persistent asthma stable at present Plan Cont symbicort and flonase

## 2016-08-17 NOTE — Assessment & Plan Note (Signed)
See depression assessment

## 2016-08-17 NOTE — Assessment & Plan Note (Signed)
Needs GYN appt asap

## 2016-08-17 NOTE — Patient Instructions (Signed)
Stay on symbicort and flonase Focus on weight loss A social work consult will be obtained on anxiety depression We will fast track your cervial evaluation Return 4 months

## 2016-08-17 NOTE — Assessment & Plan Note (Signed)
Focus on weight loss 

## 2016-08-17 NOTE — Assessment & Plan Note (Signed)
Pt depressed and anxious d/t stress over possible cervical cancer Also other life stressors Scores high on depression/GAD score Plan Referral to LCSW

## 2016-08-17 NOTE — Progress Notes (Signed)
Subjective:    Patient ID: Courtney Zamora, female    DOB: [birth year], 31 y.o.   MRN: [id number]  31 y.o. F with moderate persistent asthma. Last seen 02/2016. Was ok until extreme cold exposure recently and now worse  Pt ok until when goes to sleep is hard to breath like chest pressure  Very anxious and depressed over poss cervical cancer    Asthma  She complains of chest tightness, frequent throat clearing, shortness of breath and wheezing. There is no cough, difficulty breathing, hemoptysis, hoarse voice or sputum production. Primary symptoms comments: No daytime dyspnea. This is a recurrent problem. The current episode started more than 1 month ago. Episode frequency: mostly at night. The problem has been rapidly worsening. The cough is productive of sputum, productive and hacking. Associated symptoms include dyspnea on exertion, ear congestion, ear pain, headaches, nasal congestion, orthopnea, PND and postnasal drip. Pertinent negatives include no heartburn, rhinorrhea, sneezing, sore throat or trouble swallowing. Her symptoms are aggravated by lying down. Her past medical history is significant for asthma.   PUL ASTHMA HISTORY 07/07/2016 12/09/2015  Symptoms Throughout the day Throughout the day  Nighttime awakenings >1/wk but not nightly >1/wk but not nightly  Interference with activity Extreme limitations Extreme limitations  SABA use Several times/day Several times/day  Exacerbations requiring oral steroids 2 or more / year 2 or more / year      Review of Systems  Constitutional: Positive for fatigue.  HENT: Positive for ear pain and postnasal drip. Negative for hoarse voice, rhinorrhea, sneezing, sore throat and trouble swallowing.   Respiratory: Positive for shortness of breath and wheezing. Negative for cough, hemoptysis and sputum production.   Cardiovascular: Positive for dyspnea on exertion and PND.  Gastrointestinal: Negative for heartburn.  Neurological: Positive for  headaches.       Objective:   Physical Exam  Vitals:   08/17/16 0938  BP: (!) 143/81  Pulse: 68  Temp: 97.8 F (36.6 C)  TempSrc: Oral  SpO2: 99%  Weight: (!) 362 lb 6.4 oz (164.4 kg)    Gen: Pleasant, well-nourished, in no distress,  normal affect  ENT: No lesions,  mouth clear,  oropharynx clear, no postnasal drip  Neck: No JVD, no TMG, no carotid bruits  Lungs: No use of accessory muscles, no dullness to percussion, expired wheezes  Cardiovascular: RRR, heart sounds normal, no murmur or gallops, no peripheral edema  Abdomen: soft and NT, no HSM,  BS normal  Musculoskeletal: No deformities, no cyanosis or clubbing  Neuro: alert, non focal  Skin: Warm, no lesions or rashes  No results found.     Assessment & Plan:  I personally reviewed all images and lab data in the [facility] system as well as any outside material available during this office visit and agree with the  radiology impressions.   Depression Pt depressed and anxious d/t stress over possible cervical cancer Also other life stressors Scores high on depression/GAD score Plan Referral to LCSW  Asthma, moderate persistent Moderate persistent asthma stable at present Plan Cont symbicort and flonase  Esophageal reflux Changes in diet to be made Focus on weight loss  Morbid obesity (HCC) Focus on weight loss  Anxiety See depression assessment  ASCUS with positive high risk HPV cervical Needs GYN appt asap   Meko was seen today for follow-up.  Diagnoses and all orders for this visit:  Reactive depression  Moderate persistent asthma without complication  Gastroesophageal reflux disease, esophagitis presence not specified  Morbid  obesity (HCC)  Anxiety  ASCUS with positive high risk HPV cervical

## 2016-08-17 NOTE — BH Specialist Note (Signed)
Session Start time: 10:00 AM   End Time: 10:30 AM Total Time:  30 minutes Type of Service: Behavioral Health - Individual/Family Interpreter: No.   Interpreter Name & Language: N/A # [facility] Visits July 2017-June 2018: 1st   SUBJECTIVE: Jenika L Blow is a 31 y.o. female  Pt. was referred by Dr. Delford Field for:  anxiety and depression. Pt. reports the following symptoms/concerns: overwhelming feelings of sadness and worry, irritability, weight gain, difficulty concentrating, and difficulty sleeping Duration of problem:  1 month Severity: moderate Previous treatment: None reported   OBJECTIVE: Mood: Anxious & Affect: Appropriate Risk of harm to self or others: Pt denied SI/HI Assessments administered: PHQ-9; GAD-7  LIFE CONTEXT:  Family & Social: Pt resides with spouse of one year. She stated that she has a large family that resides nearby and provides emotional support School/ Work: Pt is unemployed. She receives the [employer] and has applied for disability twice. 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 No report of substance use Life changes: Pt is awaiting results to determine whether she has cervical cancer  What is important to pt/family (values): Family, Good Health, Independence   GOALS ADDRESSED:  Decrease symptoms of depression Decrease symptoms of anxiety  INTERVENTIONS: Solution Focused, Strength-based and Supportive   ASSESSMENT:  Pt currently experiencing depression and anxiety triggered by possibility of cervical cancer. Pt reports overwhelming feelings of sadness and worry, irritability, weight gain, difficulty concentrating, and difficulty sleeping. She receives support from her spouse and large family who resides nearby. Pt may benefit from psychoeducation, psychotherapy, and medication management. LCSWA educated pt on the correlation between physical and mental health. Pt's  feelings regarding lab results were validated. LCSWA discussed the benefits of applying healthy coping skills to decrease symptoms of depression and anxiety. Pt identified strategies that she can utilize on a daily basis. LCSWA provided pt with community resources on crisis intervention, psychotherapy, and medication management.     PLAN: 1. F/U with behavioral health clinician: Pt was encouraged to contact LCSWA if symptoms worsen or fail to improve to schedule behavioral appointments at [facility] 2. Behavioral Health meds: None reported 3. Behavioral recommendations: LCSWA recommends that pt apply healthy coping skills discussed. Pt is encouraged to schedule follow up appointment with LCSWA 4. Referral: Brief Counseling/Psychotherapy, [employer], Problem-solving teaching/coping strategies, Psychoeducation and Supportive Counseling 5. From scale of 1-10, how likely are you to follow plan: 7/10   Bridgett Larsson, MSW, LCSWA  Clinical Social Worker 08/18/16 11:14 AM  Warmhandoff:   Warm Hand Off Completed.

## 2016-08-19 ENCOUNTER — Ambulatory Visit (HOSPITAL_COMMUNITY)
Admission: RE | Admit: 2016-08-19 | Discharge: 2016-08-19 | Disposition: A | Discharge status: Home / Self Care | Payer: Medicaid Other | Source: Ambulatory Visit | Attending: Family Medicine | Admitting: Family Medicine

## 2016-08-19 DIAGNOSIS — W010XXA Fall on same level from slipping, tripping and stumbling without subsequent striking against object, initial encounter: Secondary | ICD-10-CM | POA: Insufficient documentation

## 2016-08-19 DIAGNOSIS — S99912A Unspecified injury of left ankle, initial encounter: Secondary | ICD-10-CM

## 2016-08-19 DIAGNOSIS — M79672 Pain in left foot: Secondary | ICD-10-CM

## 2016-08-19 DIAGNOSIS — S99922A Unspecified injury of left foot, initial encounter: Secondary | ICD-10-CM | POA: Insufficient documentation

## 2016-08-19 DIAGNOSIS — M25472 Effusion, left ankle: Secondary | ICD-10-CM | POA: Insufficient documentation

## 2016-08-22 ENCOUNTER — Encounter: Payer: Self-pay | Admitting: Family Medicine

## 2016-08-22 ENCOUNTER — Ambulatory Visit: Payer: Medicaid Other | Attending: Family Medicine

## 2016-08-22 ENCOUNTER — Other Ambulatory Visit: Payer: Self-pay | Admitting: Family Medicine

## 2016-08-22 ENCOUNTER — Ambulatory Visit (HOSPITAL_BASED_OUTPATIENT_CLINIC_OR_DEPARTMENT_OTHER): Payer: Self-pay | Admitting: Family Medicine

## 2016-08-22 ENCOUNTER — Ambulatory Visit: Payer: Self-pay | Admitting: Family Medicine

## 2016-08-22 VITALS — BP 129/81 | HR 76 | Temp 98.0°F | Ht 64.0 in | Wt 357.2 lb

## 2016-08-22 DIAGNOSIS — I10 Essential (primary) hypertension: Secondary | ICD-10-CM | POA: Insufficient documentation

## 2016-08-22 DIAGNOSIS — G8929 Other chronic pain: Secondary | ICD-10-CM

## 2016-08-22 DIAGNOSIS — M25572 Pain in left ankle and joints of left foot: Secondary | ICD-10-CM | POA: Insufficient documentation

## 2016-08-22 DIAGNOSIS — M25561 Pain in right knee: Secondary | ICD-10-CM | POA: Insufficient documentation

## 2016-08-22 DIAGNOSIS — M79672 Pain in left foot: Secondary | ICD-10-CM

## 2016-08-22 MED ORDER — TIZANIDINE HCL 4 MG PO TABS: ORAL_TABLET | ORAL | 3 refills | Status: DC

## 2016-08-22 MED FILL — tiZANidine HCL 4 MG TABS: 4 | 30 days supply | Qty: 120 | Fill #0

## 2016-08-22 MED FILL — ?PANTOPRAZOLE SOD DR 40MG: 40 MG | 30 days supply | Qty: 30 | Fill #1

## 2016-08-22 MED FILL — DICLOFENAC SOD DR 75 MG TAB: 75 | 30 days supply | Qty: 60 | Fill #5

## 2016-08-22 MED FILL — ACETAMINOPHEN/COD #3 TABLET: 300-30 | 30 days supply | Qty: 120 | Fill #2

## 2016-08-22 MED FILL — ?CETIRIZINE HCL 10 MG TABLE: 10 | 30 days supply | Qty: 30 | Fill #4

## 2016-08-22 MED FILL — GABAPENTIN 300 MG CAPSULE: 300 | 30 days supply | Qty: 60 | Fill #3

## 2016-08-22 NOTE — Progress Notes (Signed)
Subjective:  Patient ID: Courtney Zamora, female    DOB: 1986/05/18  Age: 31 y.o. MRN: [id number]  CC: Foot Pain   HPI Courtney Zamora presents for    1. Left foot pain: left lateral foot pain started after slipping in mud on 08/08/2016. There is no bruising or swelling. She is able to bear weight. She has pain with stepping. She completed L foot x-ray. Negative for fracture. She has foot swelling that is worse in R foot. She has noticed worsening swelling since starting Norvasc.   2. HTN: she is compliant with Norvasc 5 mg daily. She is having foot swelling. No HA, CP or SOB.   3. Depressed and anxious mood: she feels depressed about her health. Her pain. She desires children and has been unable to conceive. She denies SI.   Social History  Substance Use Topics  . Smoking status: Never Smoker  . Smokeless tobacco: Never Used  . Alcohol use No   Outpatient Medications Prior to Visit  Medication Sig Dispense Refill  . acetaminophen-codeine (TYLENOL #3) 300-30 MG tablet Take 1 tablet by mouth every 6 (six) hours as needed. for pain 90 tablet 2  . acetaminophen-codeine (TYLENOL #4) 300-60 MG tablet Take 1 tablet by mouth every 6 (six) hours as needed for moderate pain. 45 tablet 2  . albuterol (PROVENTIL HFA;VENTOLIN HFA) 108 (90 Base) MCG/ACT inhaler Inhale 2 puffs into the lungs every 6 (six) hours as needed for wheezing or shortness of breath (wheezing). For shortness of breath. 54 Inhaler 3  . budesonide-formoterol (SYMBICORT) 160-4.5 MCG/ACT inhaler Inhale 2 puffs into the lungs 2 (two) times daily. 3 Inhaler 3  . cetirizine (ZYRTEC) 10 MG tablet Take 1 tablet (10 mg total) by mouth daily. 30 tablet 11  . diclofenac (VOLTAREN) 75 MG EC tablet Take 1 tablet (75 mg total) by mouth 2 (two) times daily. Take with food 180 tablet 1  . fluticasone (FLONASE) 50 MCG/ACT nasal spray Place 2 sprays into both nostrils 2 (two) times daily. 16 g 6  . gabapentin (NEURONTIN) 300 MG capsule Take 2  capsules (600 mg total) by mouth at bedtime. 60 capsule 3  . pantoprazole (PROTONIX) 40 MG tablet Take 1 tablet (40 mg total) by mouth daily. 30 tablet 3  . tiZANidine (ZANAFLEX) 4 MG tablet TAKE 1 TABLET BY MOUTH EVERY 6 HOURS AS NEEDED FOR MUSCLE SPASMS. 120 tablet 0  . amLODipine (NORVASC) 5 MG tablet Take 1 tablet (5 mg total) by mouth daily. (Patient not taking: Reported on 08/17/2016) 30 tablet 3  . benzocaine-resorcinol (VAGISIL) 5-2 % vaginal cream Place vaginally at bedtime. (Patient not taking: Reported on 08/22/2016) 60 g 0  . Calcium Citrate 200 MG TABS Take 2 tablets (400 mg total) by mouth daily. (Patient not taking: Reported on 08/22/2016) 60 tablet 11  . Elastic Bandages & Supports (MEDICAL COMPRESSION SOCKS) MISC 1 each by Does not apply route daily. Knee high compression stockings 30 mmHg pressure (Patient not taking: Reported on 08/22/2016) 2 each 0  . fluconazole (DIFLUCAN) 150 MG tablet Take 1 tablet (150 mg total) by mouth daily. (Patient not taking: Reported on 08/22/2016) 1 tablet 0  . lidocaine (XYLOCAINE) 2 % jelly Apply 1 application topically every 6 (six) hours as needed. (Patient not taking: Reported on 08/22/2016) 30 mL 0  . predniSONE (DELTASONE) 10 MG tablet Take 4 for three days 3 for three days 2 for three days 1 for three days STOP (Patient not taking: Reported on  08/17/2016) 21 tablet 0  . Vitamin D, Ergocalciferol, (DRISDOL) 50000 units CAPS capsule Take 1 capsule (50,000 Units total) by mouth every 30 (thirty) days. (Patient not taking: Reported on 08/17/2016) 3 capsule 3   No facility-administered medications prior to visit.     ROS Review of Systems  Constitutional: Negative for chills and fever.  Eyes: Negative for visual disturbance.  Respiratory: Negative for shortness of breath.   Cardiovascular: Negative for chest pain.  Gastrointestinal: Negative for abdominal pain and blood in stool.  Genitourinary: Positive for pelvic pain (improving ).  Musculoskeletal:  Positive for arthralgias and gait problem. Negative for back pain.  Skin: Negative for rash.  Allergic/Immunologic: Negative for immunocompromised state.  Hematological: Negative for adenopathy. Does not bruise/bleed easily.  Psychiatric/Behavioral: Positive for dysphoric mood. Negative for suicidal ideas. The patient is nervous/anxious.     Objective:  BP 129/81 (BP Location: Right Wrist, Patient Position: Sitting, Cuff Size: Small)   Pulse 76   Temp 98 F (36.7 C) (Oral)   Ht 5\' 4"  (1.626 m)   Wt (!) 357 lb 3.2 oz (162 kg)   LMP 08/14/2016   SpO2 100%   BMI 61.31 kg/m   BP/Weight 08/22/2016 08/17/2016 08/12/2016  Systolic BP 129 143 165  Diastolic BP 81 81 85  Wt. (Lbs) 357.2 362.4 355.8  BMI 61.31 62.21 61.07    Physical Exam  Constitutional: She is oriented to person, place, and time. She appears well-developed and well-nourished. No distress.  HENT:  Head: Normocephalic and atraumatic.  Cardiovascular: Normal rate, regular rhythm, normal heart sounds and intact distal pulses.   Pulmonary/Chest: Effort normal and breath sounds normal.  Musculoskeletal: She exhibits no edema.       Feet:  Neurological: She is alert and oriented to person, place, and time.  Skin: Skin is warm and dry. No rash noted.  Psychiatric: She has a normal mood and affect.   Depression screen [facility] 2/9 08/17/2016 08/12/2016 07/28/2016  Decreased Interest 2 3 0  Down, Depressed, Hopeless 2 2 0  PHQ - 2 Score 4 5 0  Altered sleeping 2 2 2   Tired, decreased energy 1 2 1   Change in appetite 0 2 0  Feeling bad or failure about yourself  0 0 0  Trouble concentrating 1 2 0  Moving slowly or fidgety/restless 0 0 0  Suicidal thoughts 0 0 0  PHQ-9 Score 8 13 3   Some recent data might be hidden   GAD 7 : Generalized Anxiety Score 08/17/2016 08/12/2016 07/28/2016 07/06/2016  Nervous, Anxious, on Edge 3 2 2  0  Control/stop worrying 3 3 0 0  Worry too much - different things 3 2 0 0  Trouble relaxing 2 2 3 3     Restless 1 1 3 2   Easily annoyed or irritable 3 2 3 3   Afraid - awful might happen 0 0 0 0  Total GAD 7 Score 15 12 11 8       Assessment & Plan:  Courtney Zamora was seen today for foot pain and hypertension.  Diagnoses and all orders for this visit:  Hypertension, unspecified type  Chronic pain of right knee -     tiZANidine (ZANAFLEX) 4 MG tablet; TAKE 1 TABLET BY MOUTH EVERY 6 HOURS AS NEEDED FOR MUSCLE SPASMS.  Chronic pain of left ankle -     tiZANidine (ZANAFLEX) 4 MG tablet; TAKE 1 TABLET BY MOUTH EVERY 6 HOURS AS NEEDED FOR MUSCLE SPASMS.  Left foot pain   There are no  diagnoses linked to this encounter.  No orders of the defined types were placed in this encounter.   Follow-up: Return in about 4 weeks (around 09/19/2016) for HTN .   Dessa Phi MD

## 2016-08-22 NOTE — Progress Notes (Signed)
Pt is here today for left foot pain and follow up on new BP medication.

## 2016-08-22 NOTE — Patient Instructions (Addendum)
Courtney Zamora was seen today for foot pain and hypertension.  Diagnoses and all orders for this visit:  Hypertension, unspecified type  Chronic pain of right knee -     tiZANidine (ZANAFLEX) 4 MG tablet; TAKE 1 TABLET BY MOUTH EVERY 6 HOURS AS NEEDED FOR MUSCLE SPASMS.  Chronic pain of left ankle -     tiZANidine (ZANAFLEX) 4 MG tablet; TAKE 1 TABLET BY MOUTH EVERY 6 HOURS AS NEEDED FOR MUSCLE SPASMS.  Left foot pain   Recommend CAM walker for next 3-4 weeks for persistent left foot pain  F/u in 4 weeks for HTN   Dr. Armen Pickup   Walking Boot A walking boot (controlled ankle motion boot or CAM walker) is a removable boot-shaped splint that holds your foot or ankle in place after an injury or a medical procedure. This helps with healing and prevents further injury. A walking boot has a stiff, rigid outer frame that limits movement and supports your leg and foot. The inner lining is a layer of padded material. Walking boots usually have several adjustable straps to secure them over the foot. Your health care provider may prescribe a walking boot if it is okay for you to use your injured foot to support your body weight. How much you can walk with the boot on will depend on the type and severity of your injury. Your health care provider will recommend the best boot for you based on your condition. HOW DO I PUT ON MY WALKING BOOT? There are different types of walking boots. Each type of boot has specific instructions about how to wear it properly. Follow instructions from your health care provider about wearing yours. In general:  Sit down to put on your boot. This is more comfortable and it helps to prevent falls.  Open up the boot fully. Place your foot into the boot so that your heel rests against the back.  Your toes should be supported by the base of the boot, but they should not hang over the front.  Adjust the straps so the boot fits securely but is not too tight.  Do not bend the hard  frame of the boot to get a good fit.  Ask someone to help you put on the boot, if needed. WHAT ARE SOME TIPS FOR WALKING WITH A WALKING BOOT?  Do not try to walk without wearing the boot unless your health care provider has approved.  Rest your injured leg as much as possible.  Use other assistive walking devices as told by your health care provider. These include crutches and canes.  On your other foot, wear a shoe with a heel that is close to the height of the boot.  Be very careful when walking on surfaces that are uneven or wet. HOW CAN I REDUCE SWELLING?  Rest your injured foot or leg as much as possible.  If directed, apply ice to the injured area:  Put ice in a plastic bag.  Place a towel between your skin and the bag.  Leave the ice on for 20 minutes, 2-3 times a day for two days or as told by your health care provider.  Keep your injured leg raised (elevated) above the level of your heart for 2?3 hours each day or as told by your health care provider.  If swelling gets worse, loosen the boot and rest and raise your foot.  Contact your health care provider if swelling does not get better or if it gets worse over time.  WHAT SKIN CARE PRACTICES SHOULD I FOLLOW?  Wear a long sock to protect your foot and leg from rubbing inside the boot.  Take off the boot one time per day to check the injured area.  Follow instructions from your health care provider about taking care of your incision or wound, if this applies.  Clean and wash the injured area as told by your health care provider.  Gently dry your foot and leg before putting the boot back on.  Contact your health care provider if a wound is getting worse or if your skin becomes red, painful, or irritated. ARE THERE ANY ACTIVITY RESTRICTIONS? Activity restrictions depend on the type and severity of your injury. Follow instructions from your health care provider.  Bathe and shower as directed by your health care  provider.  Do not do activities that could make your injury worse.  Do not drive if your affected foot is one that you usually use for driving. HOW SHOULD I KEEP MY BOOT CLEAN?  Clean the frame and the liner of the boot by hand. Use a washcloth with mild soap and water.  Do not use chemical cleaning products. These could irritate your skin, especially if you have a wound or an incision.  Do not soak the liner of the boot.  Do not put any part of the boot in a washing machine or a clothes dryer.  Allow the boot to air dry completely before you put it back on your foot. This information is not intended to replace advice given to you by your health care provider. Make sure you discuss any questions you have with your health care provider. Document Released: 11/18/2014 Document Reviewed: 11/18/2014 Elsevier Interactive Patient Education  2017 [employer].

## 2016-08-22 NOTE — Assessment & Plan Note (Signed)
Persist  Negative x-ray Plan: CAM walker

## 2016-08-22 NOTE — Assessment & Plan Note (Signed)
Improved with norvasc 5 mg daily Foot swelling Continue current regimen

## 2016-08-22 NOTE — Telephone Encounter (Signed)
I called and left message again with Noreene Larsson to return my call

## 2016-08-24 ENCOUNTER — Encounter: Payer: Self-pay | Admitting: Family Medicine

## 2016-08-24 NOTE — Telephone Encounter (Signed)
Patient concern

## 2016-08-25 NOTE — Telephone Encounter (Signed)
Please inform the patient of Dr. Armen Pickup response. For some reason they are coming to me.

## 2016-08-31 ENCOUNTER — Encounter: Payer: Self-pay | Admitting: Physician Assistant

## 2016-08-31 ENCOUNTER — Ambulatory Visit: Payer: Self-pay | Attending: Physician Assistant | Admitting: Physician Assistant

## 2016-08-31 VITALS — BP 158/88 | HR 91 | Temp 98.0°F | Resp 14 | Ht 64.0 in | Wt 371.8 lb

## 2016-08-31 DIAGNOSIS — R569 Unspecified convulsions: Secondary | ICD-10-CM | POA: Insufficient documentation

## 2016-08-31 DIAGNOSIS — Z7951 Long term (current) use of inhaled steroids: Secondary | ICD-10-CM | POA: Insufficient documentation

## 2016-08-31 DIAGNOSIS — F419 Anxiety disorder, unspecified: Secondary | ICD-10-CM | POA: Insufficient documentation

## 2016-08-31 DIAGNOSIS — Z6841 Body Mass Index (BMI) 40.0 and over, adult: Secondary | ICD-10-CM | POA: Insufficient documentation

## 2016-08-31 DIAGNOSIS — J0100 Acute maxillary sinusitis, unspecified: Secondary | ICD-10-CM

## 2016-08-31 DIAGNOSIS — J45909 Unspecified asthma, uncomplicated: Secondary | ICD-10-CM | POA: Insufficient documentation

## 2016-08-31 DIAGNOSIS — Z79899 Other long term (current) drug therapy: Secondary | ICD-10-CM | POA: Insufficient documentation

## 2016-08-31 MED ORDER — SALINE SPRAY 0.65 % NA SOLN
1.0000 | NASAL | 0 refills | Status: DC | PRN
Start: 2016-08-31 — End: 2016-08-31

## 2016-08-31 MED ORDER — GUAIFENESIN ER 1200 MG PO TB12: 1.0000 | ORAL_TABLET | Freq: Two times a day (BID) | ORAL | 0 refills | Status: AC

## 2016-08-31 MED ORDER — AMOXICILLIN-POT CLAVULANATE 875-125 MG PO TABS: 1.0000 | ORAL_TABLET | Freq: Two times a day (BID) | ORAL | 0 refills | Status: DC

## 2016-08-31 MED ORDER — AMOXICILLIN-POT CLAVULANATE 875-125 MG PO TABS: 1.0000 | ORAL_TABLET | Freq: Two times a day (BID) | ORAL | 0 refills | Status: AC

## 2016-08-31 MED ORDER — SALINE SPRAY 0.65 % NA SOLN: 1.0000 | NASAL | 0 refills | Status: DC | PRN

## 2016-08-31 MED ORDER — GUAIFENESIN ER 1200 MG PO TB12: 1.0000 | ORAL_TABLET | Freq: Two times a day (BID) | ORAL | 0 refills | Status: DC

## 2016-08-31 MED FILL — AMOX-CLAV 875-125 MG TABLET: 875-125 | 10 days supply | Qty: 20 | Fill #0

## 2016-08-31 NOTE — Progress Notes (Signed)
Subjective:  Patient ID: Courtney Zamora, female    DOB: September 27, 1985  Age: 31 y.o. MRN: [id number]  CC: right sided maxillary sinus pressure  HPI Courtney Zamora is a 31 y.o. female with a PMH of Anxiety, Asthma, Seizures, and morbid obesity presents with a 3 day history of right sided maxillary sinus pressure. Feels pressure in the right eye. Has some blood tinged mucus. Mild cough.  Reports "everyone" at home sick. Denies chest pain, SOB, headache, abdominal pain, rash, fever, chills, nausea, vomiting, or GI/GU sxs.    Outpatient Medications Prior to Visit  Medication Sig Dispense Refill  . acetaminophen-codeine (TYLENOL #3) 300-30 MG tablet Take 1 tablet by mouth every 6 (six) hours as needed. for pain 90 tablet 2  . acetaminophen-codeine (TYLENOL #4) 300-60 MG tablet Take 1 tablet by mouth every 6 (six) hours as needed for moderate pain. 45 tablet 2  . albuterol (PROVENTIL HFA;VENTOLIN HFA) 108 (90 Base) MCG/ACT inhaler Inhale 2 puffs into the lungs every 6 (six) hours as needed for wheezing or shortness of breath (wheezing). For shortness of breath. 54 Inhaler 3  . amLODipine (NORVASC) 5 MG tablet Take 1 tablet (5 mg total) by mouth daily. (Patient not taking: Reported on 08/17/2016) 30 tablet 3  . budesonide-formoterol (SYMBICORT) 160-4.5 MCG/ACT inhaler Inhale 2 puffs into the lungs 2 (two) times daily. 3 Inhaler 3  . Calcium Citrate 200 MG TABS Take 2 tablets (400 mg total) by mouth daily. (Patient not taking: Reported on 08/22/2016) 60 tablet 11  . cetirizine (ZYRTEC) 10 MG tablet Take 1 tablet (10 mg total) by mouth daily. 30 tablet 11  . diclofenac (VOLTAREN) 75 MG EC tablet Take 1 tablet (75 mg total) by mouth 2 (two) times daily. Take with food 180 tablet 1  . Elastic Bandages & Supports (MEDICAL COMPRESSION SOCKS) MISC 1 each by Does not apply route daily. Knee high compression stockings 30 mmHg pressure (Patient not taking: Reported on 08/22/2016) 2 each 0  . fluticasone (FLONASE) 50  MCG/ACT nasal spray Place 2 sprays into both nostrils 2 (two) times daily. 16 g 6  . gabapentin (NEURONTIN) 300 MG capsule Take 2 capsules (600 mg total) by mouth at bedtime. 60 capsule 3  . pantoprazole (PROTONIX) 40 MG tablet Take 1 tablet (40 mg total) by mouth daily. 30 tablet 3  . predniSONE (DELTASONE) 10 MG tablet Take 4 for three days 3 for three days 2 for three days 1 for three days STOP (Patient not taking: Reported on 08/17/2016) 21 tablet 0  . tiZANidine (ZANAFLEX) 4 MG tablet TAKE 1 TABLET BY MOUTH EVERY 6 HOURS AS NEEDED FOR MUSCLE SPASMS. 120 tablet 3  . Vitamin D, Ergocalciferol, (DRISDOL) 50000 units CAPS capsule Take 1 capsule (50,000 Units total) by mouth every 30 (thirty) days. (Patient not taking: Reported on 08/17/2016) 3 capsule 3  . benzocaine-resorcinol (VAGISIL) 5-2 % vaginal cream Place vaginally at bedtime. (Patient not taking: Reported on 08/22/2016) 60 g 0  . fluconazole (DIFLUCAN) 150 MG tablet Take 1 tablet (150 mg total) by mouth daily. (Patient not taking: Reported on 08/22/2016) 1 tablet 0  . lidocaine (XYLOCAINE) 2 % jelly Apply 1 application topically every 6 (six) hours as needed. (Patient not taking: Reported on 08/22/2016) 30 mL 0   No facility-administered medications prior to visit.      ROS Review of Systems  Constitutional: Negative for chills and fever.  Eyes: Positive for pain (right eye pressure).  Respiratory: Positive for cough. Negative  for shortness of breath.   Cardiovascular: Negative for chest pain.  Gastrointestinal: Negative for abdominal pain, nausea and vomiting.  Genitourinary: Negative for dysuria.  Musculoskeletal: Negative for myalgias.  Skin: Negative for rash.  Neurological: Negative for headaches.    Objective:  BP (!) 158/88   Pulse 91   Temp 98 F (36.7 C)   Resp 14   Ht 5\' 4"  (1.626 m)   Wt (!) 371 lb 12.8 oz (168.6 kg)   LMP 08/14/2016   SpO2 100%   BMI 63.82 kg/m   BP/Weight 08/31/2016 08/22/2016 08/17/2016  Systolic  BP 158 08/19/2016 143  Diastolic BP 88 81 81  Wt. (Lbs) 371.8 357.2 362.4  BMI 63.82 61.31 62.21      Physical Exam  Constitutional: She is oriented to person, place, and time.  NAD, morbidly obese  HENT:  Head: Normocephalic and atraumatic.  Turbinates hypertrophic, erythematous, and with whitish rhinorrhea. Postnasal drip. TM mildly erythematous bilaterally with no bulging or retraction.  Eyes: No scleral icterus.  Neck: Normal range of motion. Neck supple.  Cardiovascular: Normal rate, regular rhythm and normal heart sounds.   Pulmonary/Chest: Effort normal and breath sounds normal.  Lymphadenopathy:    She has cervical adenopathy (mild anterior cervical lymphadenopathy).  Neurological: She is alert and oriented to person, place, and time.  Skin: Skin is warm and dry. No rash noted.  Psychiatric: She has a normal mood and affect.     Assessment & Plan:   1. Acute non-recurrent maxillary sinusitis  - Guaifenesin (MUCINEX MAXIMUM STRENGTH) 1200 MG TB12; Take 1 tablet (1,200 mg total) by mouth 2 (two) times daily.  Dispense: 10 tablet; Refill: 0 - sodium chloride (OCEAN) 0.65 % SOLN nasal spray; Place 1 spray into both nostrils as needed for congestion.  Dispense: 1 Bottle; Refill: 0 - amoxicillin-clavulanate (AUGMENTIN) 875-125 MG tablet; Take 1 tablet by mouth 2 (two) times daily.  Dispense: 20 tablet; Refill: 0   Meds ordered this encounter  Medications  . DISCONTD: Guaifenesin (MUCINEX MAXIMUM STRENGTH) 1200 MG TB12    Sig: Take 1 tablet (1,200 mg total) by mouth 2 (two) times daily.    Dispense:  10 tablet    Refill:  0    Order Specific Question:   Supervising Provider    Answer:   [id number]  . DISCONTD: amoxicillin-clavulanate (AUGMENTIN) 875-125 MG tablet    Sig: Take 1 tablet by mouth 2 (two) times daily.    Dispense:  20 tablet    Refill:  0    Order Specific Question:   Supervising Provider    Answer:   [id number] Quentin Angst  .  DISCONTD: sodium chloride (OCEAN) 0.65 % SOLN nasal spray    Sig: Place 1 spray into both nostrils as needed for congestion.    Dispense:  1 Bottle    Refill:  0    Order Specific Question:   Supervising Provider    Answer:   [id number] Quentin Angst  . Guaifenesin (MUCINEX MAXIMUM STRENGTH) 1200 MG TB12    Sig: Take 1 tablet (1,200 mg total) by mouth 2 (two) times daily.    Dispense:  10 tablet    Refill:  0    Order Specific Question:   Supervising Provider    Answer:   [id number] Quentin Angst  . sodium chloride (OCEAN) 0.65 % SOLN nasal spray    Sig: Place 1 spray into both nostrils as needed for congestion.    Dispense:  1 Bottle    Refill:  0    Order Specific Question:   Supervising Provider    Answer:   Quentin Angst [id number]  . amoxicillin-clavulanate (AUGMENTIN) 875-125 MG tablet    Sig: Take 1 tablet by mouth 2 (two) times daily.    Dispense:  20 tablet    Refill:  0    Order Specific Question:   Supervising Provider    Answer:   Quentin Angst [id number]    Follow-up: Return in about 3 weeks (around 09/19/2016) for HTN follow up.   Loletta Specter PA

## 2016-08-31 NOTE — Patient Instructions (Signed)

## 2016-09-10 ENCOUNTER — Telehealth (HOSPITAL_COMMUNITY): Payer: Self-pay | Admitting: Lactation Services

## 2016-09-10 NOTE — Telephone Encounter (Addendum)
Mother called stated her pregnancy test is negative but she has started lactating and wanted to know why.  Recommend she contact her OB/GYN or general MD.  Mother wanted LC to give ideas of what it could be but LC stated that she is unable to diagnose.

## 2016-09-13 ENCOUNTER — Encounter: Payer: Self-pay | Admitting: Obstetrics and Gynecology

## 2016-09-13 ENCOUNTER — Ambulatory Visit: Payer: Self-pay | Attending: Family Medicine | Admitting: Family Medicine

## 2016-09-13 ENCOUNTER — Encounter: Payer: Self-pay | Admitting: Family Medicine

## 2016-09-13 VITALS — BP 142/71 | HR 66 | Temp 97.5°F | Ht 64.0 in | Wt 367.0 lb

## 2016-09-13 DIAGNOSIS — F3289 Other specified depressive episodes: Secondary | ICD-10-CM | POA: Insufficient documentation

## 2016-09-13 DIAGNOSIS — N6452 Nipple discharge: Secondary | ICD-10-CM | POA: Insufficient documentation

## 2016-09-13 DIAGNOSIS — F329 Major depressive disorder, single episode, unspecified: Secondary | ICD-10-CM

## 2016-09-13 DIAGNOSIS — I10 Essential (primary) hypertension: Secondary | ICD-10-CM | POA: Insufficient documentation

## 2016-09-13 LAB — POCT URINALYSIS DIPSTICK
Glucose, UA: NEGATIVE
Ketones, UA: 15
LEUKOCYTES UA: NEGATIVE
NITRITE UA: POSITIVE
PH UA: 5.5
PROTEIN UA: 100
Spec Grav, UA: 1.025
Urobilinogen, UA: 1

## 2016-09-13 LAB — POCT URINE PREGNANCY: Preg Test, Ur: NEGATIVE

## 2016-09-13 MED ORDER — BUPROPION HCL ER (XL) 150 MG PO TB24: 150.0000 mg | ORAL_TABLET | Freq: Every day | ORAL | 2 refills | Status: DC

## 2016-09-13 MED ORDER — HYDROCHLOROTHIAZIDE 12.5 MG PO TABS: 12.5000 mg | ORAL_TABLET | Freq: Every day | ORAL | 3 refills | Status: DC

## 2016-09-13 MED FILL — ?HYDROCHLOROTHIAZIDE 12.5MG: 12.5 | 30 days supply | Qty: 30 | Fill #0

## 2016-09-13 MED FILL — BUPROPION HCL XL 150 MG TAB: 150 | 30 days supply | Qty: 30 | Fill #0

## 2016-09-13 NOTE — Assessment & Plan Note (Signed)
wellbutrin for depression in setting of morbid obesity

## 2016-09-13 NOTE — Assessment & Plan Note (Signed)
Flushing with norvasc Change to HCTZ 12.5 mg daily for HTN in swelling in obesity

## 2016-09-13 NOTE — Progress Notes (Signed)
Subjective:  Patient ID: Courtney Zamora, female    DOB: 02/23/1986  Age: 31 y.o. MRN: [id number]  CC: Hypertension   HPI Courtney Zamora presents for    1. HTN: she stopped norvasc 3 days ago due to flushing on L side of face that lasted for about 6 hrs after taking the medication. She has leg swelling. She denies CP or SOB.  2. Depressed and anxious mood: she feels depressed about her health. Her pain. She desires children and has been unable to conceive. She denies SI. She is amenable to treatment. Her mood has improved since last visit.   3. Leaking breast: for one week. No breast pain or lumps. Leaking from both breast.   Social History  Substance Use Topics  . Smoking status: Never Smoker  . Smokeless tobacco: Never Used  . Alcohol use No   Outpatient Medications Prior to Visit  Medication Sig Dispense Refill  . acetaminophen-codeine (TYLENOL #3) 300-30 MG tablet Take 1 tablet by mouth every 6 (six) hours as needed. for pain 90 tablet 2  . acetaminophen-codeine (TYLENOL #4) 300-60 MG tablet Take 1 tablet by mouth every 6 (six) hours as needed for moderate pain. 45 tablet 2  . albuterol (PROVENTIL HFA;VENTOLIN HFA) 108 (90 Base) MCG/ACT inhaler Inhale 2 puffs into the lungs every 6 (six) hours as needed for wheezing or shortness of breath (wheezing). For shortness of breath. 54 Inhaler 3  . budesonide-formoterol (SYMBICORT) 160-4.5 MCG/ACT inhaler Inhale 2 puffs into the lungs 2 (two) times daily. 3 Inhaler 3  . cetirizine (ZYRTEC) 10 MG tablet Take 1 tablet (10 mg total) by mouth daily. 30 tablet 11  . diclofenac (VOLTAREN) 75 MG EC tablet Take 1 tablet (75 mg total) by mouth 2 (two) times daily. Take with food 180 tablet 1  . fluticasone (FLONASE) 50 MCG/ACT nasal spray Place 2 sprays into both nostrils 2 (two) times daily. 16 g 6  . gabapentin (NEURONTIN) 300 MG capsule Take 2 capsules (600 mg total) by mouth at bedtime. 60 capsule 3  . pantoprazole (PROTONIX) 40 MG tablet  Take 1 tablet (40 mg total) by mouth daily. 30 tablet 3  . sodium chloride (OCEAN) 0.65 % SOLN nasal spray Place 1 spray into both nostrils as needed for congestion. 1 Bottle 0  . tiZANidine (ZANAFLEX) 4 MG tablet TAKE 1 TABLET BY MOUTH EVERY 6 HOURS AS NEEDED FOR MUSCLE SPASMS. 120 tablet 3  . amLODipine (NORVASC) 5 MG tablet Take 1 tablet (5 mg total) by mouth daily. (Patient not taking: Reported on 08/17/2016) 30 tablet 3  . Calcium Citrate 200 MG TABS Take 2 tablets (400 mg total) by mouth daily. (Patient not taking: Reported on 08/22/2016) 60 tablet 11  . Elastic Bandages & Supports (MEDICAL COMPRESSION SOCKS) MISC 1 each by Does not apply route daily. Knee high compression stockings 30 mmHg pressure (Patient not taking: Reported on 08/22/2016) 2 each 0  . predniSONE (DELTASONE) 10 MG tablet Take 4 for three days 3 for three days 2 for three days 1 for three days STOP (Patient not taking: Reported on 08/17/2016) 21 tablet 0  . Vitamin D, Ergocalciferol, (DRISDOL) 50000 units CAPS capsule Take 1 capsule (50,000 Units total) by mouth every 30 (thirty) days. (Patient not taking: Reported on 08/17/2016) 3 capsule 3   No facility-administered medications prior to visit.     ROS Review of Systems  Constitutional: Negative for chills and fever.  Eyes: Negative for visual disturbance.  Respiratory: Negative  for shortness of breath.   Cardiovascular: Negative for chest pain and leg swelling.  Gastrointestinal: Negative for abdominal pain and blood in stool.  Genitourinary: Positive for pelvic pain (improving ).  Musculoskeletal: Positive for arthralgias and gait problem. Negative for back pain.  Skin: Negative for rash.  Allergic/Immunologic: Negative for immunocompromised state.  Hematological: Negative for adenopathy. Does not bruise/bleed easily.  Psychiatric/Behavioral: Positive for dysphoric mood. Negative for suicidal ideas. The patient is nervous/anxious.     Objective:  BP (!) 142/71 (BP  Location: Left Wrist, Patient Position: Sitting, Cuff Size: Small)   Pulse 66   Temp 97.5 F (36.4 C) (Oral)   Ht 5\' 4"  (1.626 m)   Wt (!) 367 lb (166.5 kg)   LMP 09/12/2016   SpO2 100%   BMI 63.00 kg/m   BP/Weight 09/13/2016 08/31/2016 08/22/2016  Systolic BP 142 158 129  Diastolic BP 71 88 81  Wt. (Lbs) 367 371.8 357.2  BMI 63 63.82 61.31    Physical Exam  Constitutional: She is oriented to person, place, and time. She appears well-developed and well-nourished. No distress.  Obese   HENT:  Head: Normocephalic and atraumatic.  Cardiovascular: Normal rate, regular rhythm, normal heart sounds and intact distal pulses.   Pulmonary/Chest: Effort normal and breath sounds normal. Right breast exhibits no inverted nipple, no mass, no nipple discharge, no skin change and no tenderness. Left breast exhibits no inverted nipple, no mass, no nipple discharge, no skin change and no tenderness. Breasts are symmetrical.  Musculoskeletal: She exhibits no edema.       Feet:  Neurological: She is alert and oriented to person, place, and time.  Skin: Skin is warm and dry. No rash noted.  Psychiatric: She has a normal mood and affect.   U preg: negative  Depression screen [facility] 2/9 08/22/2016 08/17/2016 08/12/2016  Decreased Interest 2 2 3   Down, Depressed, Hopeless 1 2 2   PHQ - 2 Score 3 4 5   Altered sleeping 3 2 2   Tired, decreased energy 3 1 2   Change in appetite 2 0 2  Feeling bad or failure about yourself  0 0 0  Trouble concentrating 2 1 2   Moving slowly or fidgety/restless 1 0 0  Suicidal thoughts 0 0 0  PHQ-9 Score 14 8 13   Some recent data might be hidden   GAD 7 : Generalized Anxiety Score 08/22/2016 08/17/2016 08/12/2016 07/28/2016  Nervous, Anxious, on Edge 3 3 2 2   Control/stop worrying 3 3 3  0  Worry too much - different things 1 3 2  0  Trouble relaxing 2 2 2 3   Restless 2 1 1 3   Easily annoyed or irritable 3 3 2 3   Afraid - awful might happen 1 0 0 0  Total GAD 7 Score 15 15 12 11        Assessment & Plan:  Courtney Zamora was seen today for hypertension.  Diagnoses and all orders for this visit:  Reactive depression -     Discontinue: buPROPion (WELLBUTRIN XL) 150 MG 24 hr tablet; Take 1 tablet (150 mg total) by mouth daily. -     buPROPion (WELLBUTRIN XL) 150 MG 24 hr tablet; Take 1 tablet (150 mg total) by mouth daily.  Hypertension, unspecified type -     Discontinue: hydrochlorothiazide (HYDRODIURIL) 12.5 MG tablet; Take 1 tablet (12.5 mg total) by mouth daily. -     hydrochlorothiazide (HYDRODIURIL) 12.5 MG tablet; Take 1 tablet (12.5 mg total) by mouth daily. -  POCT urinalysis dipstick -     Urine culture  Nipple discharge in female -     POCT urine pregnancy   There are no diagnoses linked to this encounter.  No orders of the defined types were placed in this encounter.   Follow-up: Return in about 2 weeks (around 09/27/2016) for BP check .   Dessa Phi MD

## 2016-09-13 NOTE — Assessment & Plan Note (Signed)
Normal exam No discharge on exam Negative U preg Discussed with patient that causes include prolactinoma, thyroid dysfunction, pregnancy Since b/l and exam normal. Reassurance provided

## 2016-09-13 NOTE — Patient Instructions (Addendum)
Ayan was seen today for hypertension.  Diagnoses and all orders for this visit:  Reactive depression -     buPROPion (WELLBUTRIN XL) 150 MG 24 hr tablet; Take 1 tablet (150 mg total) by mouth daily.  Hypertension, unspecified type -     hydrochlorothiazide (HYDRODIURIL) 12.5 MG tablet; Take 1 tablet (12.5 mg total) by mouth daily.  Nipple discharge in female -     POCT urine pregnancy  breast exam is normal- no lumps or skin changes  Discharge from both breast is better than from one Non-bloody discharge  Possible causes- increased prolactin for pituitary gland in brain, thyroid dysfunction, pregnancy  F/u in  2 weeks for BP check with nurse F/u with me in 6 weeks HTN and depressed mood/weight loss (starting Wellbutrin)   Dr. Armen Pickup

## 2016-09-19 ENCOUNTER — Other Ambulatory Visit: Payer: Self-pay | Admitting: Family Medicine

## 2016-09-20 ENCOUNTER — Ambulatory Visit: Payer: Self-pay | Attending: Family Medicine | Admitting: *Deleted

## 2016-09-20 VITALS — BP 154/92 | HR 73 | Temp 98.1°F | Resp 20 | Wt 365.0 lb

## 2016-09-20 DIAGNOSIS — Z79899 Other long term (current) drug therapy: Secondary | ICD-10-CM | POA: Insufficient documentation

## 2016-09-20 DIAGNOSIS — I10 Essential (primary) hypertension: Secondary | ICD-10-CM | POA: Insufficient documentation

## 2016-09-20 NOTE — Progress Notes (Signed)
Pt arrives today for blood pressure check from 09/13/16. She states she is taking new medication for blood pressure as prescribed. Has noticed weight loss. BP reading was 142/83 left arm. Pt also adds she is feeling irritable on Wellbutrin. Guy Franco, RN.

## 2016-09-20 NOTE — Telephone Encounter (Signed)
Refill request

## 2016-09-21 ENCOUNTER — Telehealth (HOSPITAL_COMMUNITY): Payer: Self-pay | Admitting: *Deleted

## 2016-09-21 ENCOUNTER — Other Ambulatory Visit (HOSPITAL_COMMUNITY): Payer: Self-pay | Admitting: *Deleted

## 2016-09-21 ENCOUNTER — Encounter: Payer: Self-pay | Admitting: Family Medicine

## 2016-09-21 DIAGNOSIS — N6452 Nipple discharge: Secondary | ICD-10-CM

## 2016-09-21 NOTE — Telephone Encounter (Signed)
Telephoned patient at home number and left message to return call to [facility] 

## 2016-09-22 MED ORDER — PREDNISONE 10 MG PO TABS: ORAL_TABLET | ORAL | 0 refills | Status: DC

## 2016-09-22 MED ORDER — ALBUTEROL SULFATE (2.5 MG/3ML) 0.083% IN NEBU: 2.5000 mg | INHALATION_SOLUTION | Freq: Four times a day (QID) | RESPIRATORY_TRACT | 1 refills | Status: DC | PRN

## 2016-09-22 NOTE — Addendum Note (Signed)
Addended by: Dessa Phi on: 09/22/2016 12:05 PM   Modules accepted: Orders

## 2016-09-22 NOTE — Telephone Encounter (Signed)
Patient concern

## 2016-09-22 NOTE — Addendum Note (Signed)
Addended by: Dessa Phi on: 09/22/2016 08:22 AM   Modules accepted: Orders

## 2016-09-22 NOTE — Telephone Encounter (Signed)
Patient response

## 2016-09-26 MED FILL — ?PREDNISONE 10 MG TABLET: 10 | 12 days supply | Qty: 30 | Fill #0

## 2016-09-26 MED FILL — ALBUTEROL 0.083% INHAL SOLN: (2.5 MG/3ML | 15 days supply | Qty: 180 | Fill #0

## 2016-09-27 ENCOUNTER — Emergency Department (HOSPITAL_BASED_OUTPATIENT_CLINIC_OR_DEPARTMENT_OTHER)
Admission: EM | Admit: 2016-09-27 | Discharge: 2016-09-28 | Disposition: A | Discharge status: Home / Self Care | Payer: Self-pay | Attending: Emergency Medicine | Admitting: Emergency Medicine

## 2016-09-27 ENCOUNTER — Telehealth: Payer: Self-pay | Admitting: Family Medicine

## 2016-09-27 ENCOUNTER — Emergency Department (HOSPITAL_BASED_OUTPATIENT_CLINIC_OR_DEPARTMENT_OTHER): Payer: Self-pay

## 2016-09-27 ENCOUNTER — Encounter (HOSPITAL_BASED_OUTPATIENT_CLINIC_OR_DEPARTMENT_OTHER): Payer: Self-pay | Admitting: *Deleted

## 2016-09-27 DIAGNOSIS — R55 Syncope and collapse: Secondary | ICD-10-CM | POA: Insufficient documentation

## 2016-09-27 DIAGNOSIS — M25552 Pain in left hip: Secondary | ICD-10-CM | POA: Insufficient documentation

## 2016-09-27 DIAGNOSIS — M25562 Pain in left knee: Secondary | ICD-10-CM | POA: Insufficient documentation

## 2016-09-27 DIAGNOSIS — I10 Essential (primary) hypertension: Secondary | ICD-10-CM | POA: Insufficient documentation

## 2016-09-27 DIAGNOSIS — J45909 Unspecified asthma, uncomplicated: Secondary | ICD-10-CM | POA: Insufficient documentation

## 2016-09-27 DIAGNOSIS — Y999 Unspecified external cause status: Secondary | ICD-10-CM | POA: Insufficient documentation

## 2016-09-27 DIAGNOSIS — Z79899 Other long term (current) drug therapy: Secondary | ICD-10-CM | POA: Insufficient documentation

## 2016-09-27 DIAGNOSIS — S0990XA Unspecified injury of head, initial encounter: Secondary | ICD-10-CM | POA: Insufficient documentation

## 2016-09-27 DIAGNOSIS — W1800XA Striking against unspecified object with subsequent fall, initial encounter: Secondary | ICD-10-CM | POA: Insufficient documentation

## 2016-09-27 DIAGNOSIS — Y929 Unspecified place or not applicable: Secondary | ICD-10-CM | POA: Insufficient documentation

## 2016-09-27 DIAGNOSIS — Y939 Activity, unspecified: Secondary | ICD-10-CM | POA: Insufficient documentation

## 2016-09-27 DIAGNOSIS — M549 Dorsalgia, unspecified: Secondary | ICD-10-CM | POA: Insufficient documentation

## 2016-09-27 HISTORY — DX: Depression, unspecified: F32.A

## 2016-09-27 HISTORY — DX: Major depressive disorder, single episode, unspecified: F32.9

## 2016-09-27 MED ORDER — ACETAMINOPHEN 325 MG PO TABS
650.0000 mg | ORAL_TABLET | Freq: Once | ORAL | Status: AC
  Administered 2016-09-27: 650 mg via ORAL
  Filled 2016-09-27: qty 2

## 2016-09-27 MED ORDER — TRAMADOL HCL 50 MG PO TABS: 50.0000 mg | ORAL_TABLET | Freq: Four times a day (QID) | ORAL | 0 refills | Status: DC | PRN

## 2016-09-27 MED ORDER — TRAMADOL HCL 50 MG PO TABS
50.0000 mg | ORAL_TABLET | Freq: Once | ORAL | Status: AC
  Administered 2016-09-28: 50 mg via ORAL
  Filled 2016-09-27: qty 1

## 2016-09-27 MED ORDER — KETOROLAC TROMETHAMINE 60 MG/2ML IM SOLN: 60.0000 mg | Freq: Once | INTRAMUSCULAR | Status: DC

## 2016-09-27 NOTE — Telephone Encounter (Signed)
Patient states she fell today in the shower, she temporarily passed out.patient would like an appointment with her PCP but there is no availability..... Patient was also advised to go to urgent care...  Please advised patient

## 2016-09-27 NOTE — ED Notes (Addendum)
Patient refused to obtain CBG states when she is stuck with a needle she "passes out" and has a seizure RN Jasmine December made aware.

## 2016-09-27 NOTE — ED Notes (Signed)
Approached by me ,nurse first, to ask how we could help today and she was hostile.  Didn't want to answer questions.

## 2016-09-27 NOTE — ED Provider Notes (Signed)
[facility] Provider Note   CSN: [id number] Arrival date & time: 09/27/16  1612  By signing my name below, I, Nelwyn Salisbury, attest that this documentation has been prepared under the direction and in the presence of Loren Racer, MD . Electronically Signed: Nelwyn Salisbury, Scribe. 09/27/2016. 9:09 PM.  History   Chief Complaint Chief Complaint  Patient presents with  . Loss of Consciousness   The history is provided by the patient. No language interpreter was used.    HPI Comments:  Courtney Zamora is a 31 y.o. female who presents to the Emergency Department complaining of constant, mild headache s/p fall earlier today. Pt states that she got out of the shower, sat on the toilet, felt lightheaded and passed out. Per pt's husband, the pt lost consciousness fell forward, buckled her left leg under her, and hit her head. She reports associated left knee pain and an left hip pain. States her pain is chronic but worsening since the fall. Pt has not tried anything at home for her symptoms PTA.  She states she has had similar episodes of syncope in the past, and they are typically related to times when she was ill or when she sees blood. Pt is compliant with her daily prednisone for her knees. She denies any vomiting. No focal weakness or numbness. No visual changes.   Past Medical History:  Diagnosis Date  . Anxiety   . Asthma   . Depression   . Obesity   . Obesity   . Seizures [facility])     Patient Active Problem List   Diagnosis Date Noted  . Nipple discharge in female 09/13/2016  . Depression 08/17/2016  . Anxiety 08/17/2016  . ASCUS with positive high risk HPV cervical 08/14/2016  . Left foot pain 08/14/2016  . Hypertension 08/14/2016  . Long term (current) use of systemic steroids 08/14/2016  . Vulvar pain 07/28/2016  . Weight gain 01/12/2016  . Esophageal reflux 12/29/2015  . Morbid obesity (HCC) 12/29/2015  . Chronic pain of right knee 12/29/2015  . Chronic  pain of left ankle 12/29/2015  . 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 Asthma, moderate persistent 12/09/2015    Past Surgical History:  Procedure Laterality Date  . TONSILLECTOMY      OB History    Gravida Para Term Preterm AB Living   1       1 0   SAB TAB Ectopic Multiple Live Births   1               Home Medications    Prior to Admission medications   Medication Sig Start Date End Date Taking? Authorizing Provider  acetaminophen-codeine (TYLENOL #3) 300-30 MG tablet Take 1 tablet by mouth every 6 (six) hours as needed. for pain 07/25/16   Dessa Phi, MD  acetaminophen-codeine (TYLENOL #4) 300-60 MG tablet Take 1 tablet by mouth every 6 (six) hours as needed for moderate pain. 07/25/16   Josalyn Funches, MD  albuterol (PROVENTIL HFA;VENTOLIN HFA) 108 (90 Base) MCG/ACT inhaler Inhale 2 puffs into the lungs every 6 (six) hours as needed for wheezing or shortness of breath (wheezing). For shortness of breath. 03/18/16   Josalyn Funches, MD  albuterol (PROVENTIL) (2.5 MG/3ML) 0.083% nebulizer solution Take 3 mLs (2.5 mg total) by nebulization every 6 (six) hours as needed for wheezing or shortness of breath. 09/22/16   Josalyn Funches, MD  budesonide-formoterol (SYMBICORT) 160-4.5 MCG/ACT inhaler Inhale 2 puffs into the lungs 2 (two) times daily. 03/18/16  Dessa Phi, MD  buPROPion (WELLBUTRIN XL) 150 MG 24 hr tablet Take 1 tablet (150 mg total) by mouth daily. 09/13/16   Dessa Phi, MD  Calcium Citrate 200 MG TABS Take 2 tablets (400 mg total) by mouth daily. Patient not taking: Reported on 08/22/2016 08/14/16   Dessa Phi, MD  cetirizine (ZYRTEC) 10 MG tablet Take 1 tablet (10 mg total) by mouth daily. 03/18/16   Josalyn Funches, MD  diclofenac (VOLTAREN) 75 MG EC tablet TAKE 1 TABLET BY MOUTH 2 TIMES DAILY WITH FOOD 09/28/16   Dessa Phi, MD  Elastic Bandages & Supports (MEDICAL COMPRESSION SOCKS) MISC 1 each by Does not apply route daily. Knee high compression stockings 30  mmHg pressure Patient not taking: Reported on 08/22/2016 03/18/16   Dessa Phi, MD  fluticasone (FLONASE) 50 MCG/ACT nasal spray Place 2 sprays into both nostrils 2 (two) times daily. 07/06/16   Storm Frisk, MD  gabapentin (NEURONTIN) 300 MG capsule Take 2 capsules (600 mg total) by mouth at bedtime. 06/29/16   Josalyn Funches, MD  gabapentin (NEURONTIN) 300 MG capsule TAKE 2 CAPSULES BY MOUTH AT BEDTIME. 09/28/16   Josalyn Funches, MD  hydrochlorothiazide (HYDRODIURIL) 12.5 MG tablet Take 1 tablet (12.5 mg total) by mouth daily. 09/13/16   Josalyn Funches, MD  pantoprazole (PROTONIX) 40 MG tablet Take 1 tablet (40 mg total) by mouth daily. 07/25/16   Josalyn Funches, MD  predniSONE (DELTASONE) 10 MG tablet Take 4 for three days 3 for three days 2 for three days 1 for three days STOP 09/22/16   Josalyn Funches, MD  sodium chloride (OCEAN) 0.65 % SOLN nasal spray Place 1 spray into both nostrils as needed for congestion. 08/31/16   Roger Mariana Arn, PA-C  tiZANidine (ZANAFLEX) 4 MG tablet TAKE 1 TABLET BY MOUTH EVERY 6 HOURS AS NEEDED FOR MUSCLE SPASMS. 08/22/16   Dessa Phi, MD  traMADol (ULTRAM) 50 MG tablet Take 1 tablet (50 mg total) by mouth every 6 (six) hours as needed for moderate pain or severe pain. 09/27/16   Loren Racer, MD  Vitamin D, Ergocalciferol, (DRISDOL) 50000 units CAPS capsule Take 1 capsule (50,000 Units total) by mouth every 30 (thirty) days. Patient not taking: Reported on 08/17/2016 08/14/16   Dessa Phi, MD    Family History Family History  Problem Relation Age of Onset  . Hypothyroidism Mother   . Diabetes Father   . Diabetes Paternal Uncle   . Hypertension Maternal Grandmother   . Hypertension Maternal Grandfather   . Hypertension Paternal Grandmother   . Heart disease Paternal Grandfather   . Hypertension Paternal Grandfather     Social History Social History  Substance Use Topics  . Smoking status: Never Smoker  . Smokeless tobacco: Never Used  .  Alcohol use No     Allergies   Bee venom; Cinnamon; and Ibuprofen   Review of Systems Review of Systems  Constitutional: Negative for chills and fever.  HENT: Negative for congestion and facial swelling.   Eyes: Negative for visual disturbance.  Respiratory: Negative for cough and shortness of breath.   Cardiovascular: Negative for chest pain.  Gastrointestinal: Negative for abdominal pain, constipation, diarrhea, nausea and vomiting.  Genitourinary: Negative for dysuria, flank pain and pelvic pain.  Musculoskeletal: Positive for arthralgias and back pain. Negative for joint swelling, myalgias, neck pain and neck stiffness.  Skin: Negative for rash and wound.  Neurological: Positive for dizziness, syncope, light-headedness and headaches. Negative for seizures, weakness and numbness.  All other systems  reviewed and are negative.    Physical Exam Updated Vital Signs BP 134/72 (BP Location: Right Arm)   Pulse 99   Temp 98.5 F (36.9 C) (Oral)   Resp 22   Ht 5\' 4"  (1.626 m)   Wt (!) 365 lb (165.6 kg)   LMP 09/12/2016 (Approximate)   SpO2 100%   BMI 62.65 kg/m   Physical Exam  Constitutional: She is oriented to person, place, and time. She appears well-developed and well-nourished. No distress.  HENT:  Head: Normocephalic and atraumatic.  Mouth/Throat: Oropharynx is clear and moist.  Mild forehead tenderness to palpation. There is no obvious contusion, swelling or deformity. No hemotympanum. Midface is stable.  Eyes: EOM are normal. Pupils are equal, round, and reactive to light.  No nystagmus.  Neck: Normal range of motion. Neck supple.  The posterior midline cervical tenderness to palpation.  Cardiovascular: Normal rate and regular rhythm.   Pulmonary/Chest: Effort normal and breath sounds normal.  Abdominal: Soft. Bowel sounds are normal. There is no tenderness. There is no rebound and no guarding.  Musculoskeletal: Normal range of motion. She exhibits tenderness. She  exhibits no edema.  Mild tenderness to palpation over the left knee and hip. Pain is exacerbated with range of motion. No obvious deformity or swelling. No lower extremity asymmetry. No ligamentous instability of the knee. No midline thoracic or lumbar tenderness. 2+ distal pulses.  Neurological: She is alert and oriented to person, place, and time.  5/5 motor in all extremities. Sensation intact. Patient is ambulatory  Skin: Skin is warm and dry. Capillary refill takes less than 2 seconds. No rash noted. No erythema.  Psychiatric: She has a normal mood and affect. Her behavior is normal.  Nursing note and vitals reviewed.    ED Treatments / Results  DIAGNOSTIC STUDIES:  Oxygen Saturation is 100% on RA, normal by my interpretation.    COORDINATION OF CARE:  9:44 PM Discussed treatment plan with pt at bedside which includes imaging and pt agreed to plan.  Labs (all labs ordered are listed, but only abnormal results are displayed) Labs Reviewed - No data to display  EKG  EKG Interpretation  Date/Time:  Tuesday September 27 2016 20:04:28 EDT Ventricular Rate:  91 PR Interval:    QRS Duration: 93 QT Interval:  360 QTC Calculation: 443 R Axis:   54 Text Interpretation:  Sinus rhythm Confirmed by 03-31-1990  MD, Sharayah Renfrow ([place]) on 09/27/2016 9:45:06 PM Also confirmed by 09/29/2016  MD, Velton Roselle ([place]), editor [place], [phone])  on 09/28/2016 8:13:17 AM Also confirmed by 09/30/2016  MD, Lakecia Deschamps ([place])  on 09/28/2016 6:19:29 PM       Radiology Dg Femur Min 2 Views Left  Result Date: 09/27/2016 CLINICAL DATA:  Injury to the left femur after a fall today. Previous left knee injury in 2014. EXAM: LEFT FEMUR 2 VIEWS COMPARISON:  Left knee 04/25/2016 FINDINGS: Degenerative changes are demonstrated in the left hip and left knee. No evidence of acute fracture or dislocation of the left femur. No focal bone lesion or bone destruction. Bone cortex appears intact. Soft tissues are unremarkable.  IMPRESSION: No acute bony abnormalities. 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 Electronically Signed   By: 06/25/2016 M.D.   On: 09/27/2016 23:06    Procedures Procedures (including critical care time)  Medications Ordered in ED Medications  acetaminophen (TYLENOL) tablet 650 mg (650 mg Oral Given 09/27/16 2349)  traMADol (ULTRAM) tablet 50 mg (50 mg Oral Given 09/28/16  0002)     Initial Impression / Assessment and Plan / ED Course  I have reviewed the triage vital signs and the nursing notes.  Pertinent labs & imaging results that were available during my care of the patient were reviewed by me and considered in my medical decision making (see chart for details).     Normal neurologic exam. I believe imaging is necessary at this point. Patient did have tenderness increased heart rate with standing while performing orthostatic vital signs. This may be related to pain but also may be related postural tachycardia dysrhythmia. Patient has refused all blood draws. She is advised to follow-up with a cardiologist. Head injury precautions been given. X-rays of the femur just demonstrate degenerative changes of both the hip and knee without acute changes.  Final Clinical Impressions(s) / ED Diagnoses   Final diagnoses:  Syncope and collapse  Closed head injury, initial encounter  Left hip pain    New Prescriptions Discharge Medication List as of 09/27/2016 11:53 PM    START taking these medications   Details  traMADol (ULTRAM) 50 MG tablet Take 1 tablet (50 mg total) by mouth every 6 (six) hours as needed for moderate pain or severe pain., Starting Tue 09/27/2016, Print      I personally performed the services described in this documentation, which was scribed in my presence. The recorded information has been reviewed and is accurate.       Loren Racer, MD 09/28/16 [phone]

## 2016-09-27 NOTE — ED Triage Notes (Signed)
States she got up this am and took her daily medications. She took a shower and felt faint. She sat on the toilet and called for her husband and she passed out landing on her face. Pain in her left leg from her hip down her leg as a result of the fall. States she has pain in the top of her head. She could have hit her head on something in the bathroom per pt.  Pt got sassy with me during interview asking if I had my listening ears turned off as I was verifying information I was entering into her chart.

## 2016-09-27 NOTE — ED Notes (Signed)
Approached by me at nurse first

## 2016-09-27 NOTE — Telephone Encounter (Signed)
Agree with recommendations.  

## 2016-09-27 NOTE — ED Notes (Signed)
Patient refusing blood work.

## 2016-09-27 NOTE — ED Notes (Signed)
Orthostatics were done - patient no longer wants to were monitor and taking off the monitor

## 2016-09-27 NOTE — ED Notes (Signed)
Pt refused to have blood drawn  And stated no needles. Still short answers and hostile

## 2016-09-28 ENCOUNTER — Other Ambulatory Visit: Payer: Self-pay | Admitting: Family Medicine

## 2016-09-28 DIAGNOSIS — M25572 Pain in left ankle and joints of left foot: Secondary | ICD-10-CM

## 2016-09-28 DIAGNOSIS — G8929 Other chronic pain: Secondary | ICD-10-CM

## 2016-09-28 DIAGNOSIS — M25532 Pain in left wrist: Secondary | ICD-10-CM

## 2016-09-28 DIAGNOSIS — M25561 Pain in right knee: Principal | ICD-10-CM

## 2016-09-28 MED FILL — GABAPENTIN 300 MG CAPSULE: 300 | 30 days supply | Qty: 60 | Fill #0

## 2016-09-28 MED FILL — DICLOFENAC SOD DR 75 MG TAB: 75 | 30 days supply | Qty: 60 | Fill #0

## 2016-09-28 MED FILL — ?CETIRIZINE HCL 10 MG TABLE: 10 | 30 days supply | Qty: 30 | Fill #5

## 2016-09-28 MED FILL — tiZANidine HCL 4 MG TABS: 4 | 27 days supply | Qty: 110 | Fill #0

## 2016-09-28 MED FILL — ?PANTOPRAZOLE SOD DR 40MG: 40 MG | 30 days supply | Qty: 30 | Fill #2

## 2016-09-28 NOTE — ED Notes (Signed)
Patient reports that she continues to have pain. Offered medications per protocal. MD aware

## 2016-09-29 MED FILL — traMADol HCL 50 MG TABS: 50 | 3 days supply | Qty: 15 | Fill #0

## 2016-10-02 ENCOUNTER — Emergency Department (HOSPITAL_COMMUNITY)
Admission: EM | Admit: 2016-10-02 | Discharge: 2016-10-02 | Disposition: A | Discharge status: Home / Self Care | Payer: Self-pay | Attending: Emergency Medicine | Admitting: Emergency Medicine

## 2016-10-02 ENCOUNTER — Ambulatory Visit (HOSPITAL_COMMUNITY)
Admission: EM | Admit: 2016-10-02 | Discharge: 2016-10-02 | Disposition: A | Discharge status: Home / Self Care | Payer: Self-pay

## 2016-10-02 ENCOUNTER — Emergency Department (HOSPITAL_COMMUNITY): Payer: Self-pay

## 2016-10-02 ENCOUNTER — Encounter (HOSPITAL_COMMUNITY): Payer: Self-pay | Admitting: *Deleted

## 2016-10-02 ENCOUNTER — Encounter (HOSPITAL_COMMUNITY): Payer: Self-pay | Admitting: Emergency Medicine

## 2016-10-02 DIAGNOSIS — M542 Cervicalgia: Secondary | ICD-10-CM | POA: Insufficient documentation

## 2016-10-02 DIAGNOSIS — R55 Syncope and collapse: Secondary | ICD-10-CM | POA: Insufficient documentation

## 2016-10-02 DIAGNOSIS — R4020 Unspecified coma: Secondary | ICD-10-CM

## 2016-10-02 DIAGNOSIS — F0781 Postconcussional syndrome: Secondary | ICD-10-CM | POA: Insufficient documentation

## 2016-10-02 DIAGNOSIS — J45909 Unspecified asthma, uncomplicated: Secondary | ICD-10-CM | POA: Insufficient documentation

## 2016-10-02 DIAGNOSIS — Z79899 Other long term (current) drug therapy: Secondary | ICD-10-CM | POA: Insufficient documentation

## 2016-10-02 DIAGNOSIS — I10 Essential (primary) hypertension: Secondary | ICD-10-CM | POA: Insufficient documentation

## 2016-10-02 LAB — POC URINE PREG, ED: Preg Test, Ur: NEGATIVE

## 2016-10-02 MED ORDER — LORAZEPAM 1 MG PO TABS
1.0000 mg | ORAL_TABLET | Freq: Once | ORAL | Status: AC
  Administered 2016-10-02: 1 mg via ORAL
  Filled 2016-10-02: qty 1
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
## 2016-10-02 NOTE — Discharge Instructions (Signed)
Patient instructed to go to the nearest ED for further evaluation and possible intervention.

## 2016-10-02 NOTE — Discharge Instructions (Signed)
Please follow up with Dr. Armen Pickup Continue your home medicines Return for worsening symptoms
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
## 2016-10-02 NOTE — ED Triage Notes (Addendum)
Pt  Reports   Was  t  Boned     2  Days   Ago       Physiological scientist    Neck  Pain         r     Shoulder   Some   Dizzy     Pt  States    Had  A  Faint pos  preg  Test  And is  Requesting  One  Be  Done

## 2016-10-04 ENCOUNTER — Encounter (HOSPITAL_COMMUNITY): Payer: Self-pay

## 2016-10-04 ENCOUNTER — Ambulatory Visit (HOSPITAL_COMMUNITY)
Admission: RE | Admit: 2016-10-04 | Discharge: 2016-10-04 | Disposition: A | Discharge status: Home / Self Care | Payer: Self-pay | Source: Ambulatory Visit | Attending: Obstetrics and Gynecology | Admitting: Obstetrics and Gynecology

## 2016-10-04 ENCOUNTER — Ambulatory Visit
Admission: RE | Admit: 2016-10-04 | Discharge: 2016-10-04 | Disposition: A | Discharge status: Home / Self Care | Payer: No Typology Code available for payment source | Source: Ambulatory Visit | Attending: Obstetrics and Gynecology | Admitting: Obstetrics and Gynecology

## 2016-10-04 VITALS — BP 118/76 | Temp 98.7°F | Ht 64.0 in | Wt 368.2 lb

## 2016-10-04 DIAGNOSIS — R87610 Atypical squamous cells of undetermined significance on cytologic smear of cervix (ASC-US): Secondary | ICD-10-CM

## 2016-10-04 DIAGNOSIS — N6452 Nipple discharge: Secondary | ICD-10-CM

## 2016-10-04 DIAGNOSIS — R87810 Cervical high risk human papillomavirus (HPV) DNA test positive: Secondary | ICD-10-CM

## 2016-10-04 DIAGNOSIS — Z1239 Encounter for other screening for malignant neoplasm of breast: Secondary | ICD-10-CM

## 2016-10-04 MED ORDER — ALBUTEROL SULFATE (2.5 MG/3ML) 0.083% IN NEBU: 2.5000 mg | INHALATION_SOLUTION | Freq: Four times a day (QID) | RESPIRATORY_TRACT | 1 refills | Status: DC | PRN

## 2016-10-04 MED FILL — ALBUTEROL 0.083% INHAL SOLN: (2.5 MG/3ML | 7 days supply | Qty: 90 | Fill #0

## 2016-10-04 NOTE — Addendum Note (Signed)
Addended by: Dessa Phi on: 10/04/2016 01:37 PM   Modules accepted: Orders

## 2016-10-04 NOTE — Patient Instructions (Addendum)
Explained breast self awareness with Rasheda L Bovard. Patient did not need a Pap smear today due to last Pap smear was 07/28/2016. Explained the colposcopy the recommended follow up for her abnormal Pap smear. [facility] for a colposcopy to follow up for abnormal Pap smear. Appointment scheduled for Thursday, October 06, 2016 at 1020. Referred patient to the [facility] for diagnostic mammogram and possible bilateral breast ultrasounds. Appointment scheduled for Tuesday, October 04, 2016 at 1250. Ilona L Sitzman verbalized understanding.   [place]annock, Kathaleen Maser, RN 1:41 PM

## 2016-10-04 NOTE — Telephone Encounter (Signed)
Nebulizer set aside

## 2016-10-04 NOTE — Telephone Encounter (Signed)
Alycia, Please place a nebulizer machine aside for Courtney Zamora. She has asthma

## 2016-10-04 NOTE — Progress Notes (Addendum)
Complaints of bilateral spontaneous milky discharge x 2 weeks in February. Patient referred to [facility] by [facility] due to having an abnormal Pap smear 07/28/2016 that a colposcopy is recommended for follow up.  Pap Smear: Pap smear not completed today. Last Pap smear was 07/28/2016 at [facility] that was ASCUS with positive HPV. Referred patient the [facility] at [facility] for a colposcopy to follow up for abnormal Pap smear. Appointment scheduled for Tuesday, October 06, 2016 at 1020. Per patient her most recent Pap smear is the only abnormal Pap smear she has had. Last Pap smear result is in EPIC.  Physical exam: Breasts Breasts symmetrical. No skin abnormalities bilateral breasts. No nipple retraction bilateral breasts. No nipple discharge bilateral breasts. Unable to express any nipple discharge on exam. No lymphadenopathy. No lumps palpated bilateral breasts. No complaints of pain or tenderness on exam. Referred patient to the [facility] for diagnostic mammogram and possible bilateral breast ultrasounds. Appointment scheduled for Thursday, October 04, 2016 at 1250.        Pelvic/Bimanual No Pap smear completed today since last Pap smear was 07/28/2016. Pap smear not indicated per BCCCP guidelines.   Smoking History: Patient has never smoked.  Patient Navigation: Patient education provided. Access to services provided for patient through [facility] program.

## 2016-10-05 ENCOUNTER — Ambulatory Visit: Payer: No Typology Code available for payment source | Attending: Internal Medicine | Admitting: Internal Medicine

## 2016-10-05 ENCOUNTER — Encounter: Payer: Self-pay | Admitting: Internal Medicine

## 2016-10-05 ENCOUNTER — Encounter: Payer: Self-pay | Admitting: Obstetrics and Gynecology

## 2016-10-05 VITALS — BP 158/93 | HR 101 | Temp 98.5°F | Resp 16 | Wt 368.4 lb

## 2016-10-05 DIAGNOSIS — M25532 Pain in left wrist: Secondary | ICD-10-CM | POA: Insufficient documentation

## 2016-10-05 DIAGNOSIS — E669 Obesity, unspecified: Secondary | ICD-10-CM | POA: Diagnosis not present

## 2016-10-05 DIAGNOSIS — I10 Essential (primary) hypertension: Secondary | ICD-10-CM | POA: Diagnosis not present

## 2016-10-05 DIAGNOSIS — M542 Cervicalgia: Secondary | ICD-10-CM | POA: Diagnosis not present

## 2016-10-05 DIAGNOSIS — R569 Unspecified convulsions: Secondary | ICD-10-CM | POA: Insufficient documentation

## 2016-10-05 DIAGNOSIS — Z7952 Long term (current) use of systemic steroids: Secondary | ICD-10-CM | POA: Insufficient documentation

## 2016-10-05 DIAGNOSIS — R55 Syncope and collapse: Secondary | ICD-10-CM | POA: Insufficient documentation

## 2016-10-05 DIAGNOSIS — Z6841 Body Mass Index (BMI) 40.0 and over, adult: Secondary | ICD-10-CM | POA: Diagnosis not present

## 2016-10-05 DIAGNOSIS — M62838 Other muscle spasm: Secondary | ICD-10-CM | POA: Insufficient documentation

## 2016-10-05 DIAGNOSIS — W1830XA Fall on same level, unspecified, initial encounter: Secondary | ICD-10-CM | POA: Diagnosis not present

## 2016-10-05 MED ORDER — GABAPENTIN 300 MG PO CAPS: 300.0000 mg | ORAL_CAPSULE | Freq: Three times a day (TID) | ORAL | 1 refills | Status: DC

## 2016-10-05 MED ORDER — METHOCARBAMOL 750 MG PO TABS: 750.0000 mg | ORAL_TABLET | Freq: Three times a day (TID) | ORAL | 0 refills | Status: DC | PRN

## 2016-10-05 MED ORDER — CALCIUM CITRATE 200 MG PO TABS: 400.0000 mg | ORAL_TABLET | ORAL | Status: AC

## 2016-10-05 MED FILL — METHOCARBAMOL 750 MG TABLET: 750 | 10 days supply | Qty: 30 | Fill #0

## 2016-10-05 NOTE — Patient Instructions (Signed)
It was good to see you today.  We have reviewed your prior records including labs and tests today  Medications reviewed and updated - increase gabapentin to 300mg  three times daily and use Robaxin as needed for muscle spasms and pain - Your prescription(s) have been submitted to your pharmacy. Please take as directed and contact our office if you believe you are having problem(s) with the medication(s).  Please keep follow up as planned and let know if other episodes of passing out continue to occur
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
## 2016-10-05 NOTE — Assessment & Plan Note (Signed)
Elevated BP today - but SBP<100 in ED 3/18 (on day of syncopal event) Recent change from amlodipine to low dose HCTZ reviewed - Pt to monitor BP trend at home and call if SBP remains >130 for additional titration as needed

## 2016-10-06 ENCOUNTER — Telehealth: Payer: Self-pay

## 2016-10-06 ENCOUNTER — Ambulatory Visit: Payer: Self-pay | Admitting: Obstetrics and Gynecology

## 2016-10-06 ENCOUNTER — Other Ambulatory Visit: Payer: Self-pay

## 2016-10-06 DIAGNOSIS — M25561 Pain in right knee: Principal | ICD-10-CM

## 2016-10-06 DIAGNOSIS — G8929 Other chronic pain: Secondary | ICD-10-CM

## 2016-10-06 DIAGNOSIS — M25572 Pain in left ankle and joints of left foot: Secondary | ICD-10-CM

## 2016-10-06 NOTE — Addendum Note (Signed)
Encounter addended by: Priscille Heidelberg, RN on: 10/06/2016  9:54 AM<BR>    Actions taken: Follow-up modified, Sign clinical note

## 2016-10-06 NOTE — Telephone Encounter (Deleted)
Pt came into office today and requested a refill on Tylenol #3.

## 2016-10-06 NOTE — Telephone Encounter (Signed)
Error

## 2016-10-06 NOTE — Telephone Encounter (Signed)
Pt came into office yesterday to request a refill on Tylenol #3

## 2016-10-07 ENCOUNTER — Other Ambulatory Visit: Payer: Self-pay

## 2016-10-07 ENCOUNTER — Telehealth: Payer: Self-pay

## 2016-10-07 DIAGNOSIS — G8929 Other chronic pain: Secondary | ICD-10-CM

## 2016-10-07 DIAGNOSIS — M25561 Pain in right knee: Principal | ICD-10-CM

## 2016-10-07 DIAGNOSIS — M25572 Pain in left ankle and joints of left foot: Secondary | ICD-10-CM

## 2016-10-07 NOTE — Telephone Encounter (Signed)
Pt called to request refill on Tylenol #3.

## 2016-10-07 NOTE — Telephone Encounter (Signed)
Pt would like a refill on Tylenol #3

## 2016-10-10 ENCOUNTER — Encounter (HOSPITAL_COMMUNITY): Payer: Self-pay | Admitting: *Deleted

## 2016-10-10 ENCOUNTER — Encounter: Payer: Self-pay | Admitting: Family Medicine

## 2016-10-10 NOTE — Telephone Encounter (Signed)
This has been addressed.

## 2016-10-10 NOTE — Telephone Encounter (Signed)
On 07/25/16 she received a prescription for Tylenol 3 #90 pills with 2 refills which should last her up until 10/23/16 at which time she would be receiving a refill from her PCP.

## 2016-10-11 ENCOUNTER — Telehealth: Payer: Self-pay

## 2016-10-11 NOTE — Telephone Encounter (Signed)
Patient concern for Dr. Funches

## 2016-10-11 NOTE — Telephone Encounter (Signed)
Pt was called and informed that medication is too early to be refilled.

## 2016-10-12 ENCOUNTER — Other Ambulatory Visit: Payer: Self-pay | Admitting: Family Medicine

## 2016-10-12 ENCOUNTER — Telehealth: Payer: Self-pay

## 2016-10-12 MED ORDER — LORAZEPAM 0.5 MG PO TABS: 0.5000 mg | ORAL_TABLET | Freq: Once | ORAL | 0 refills | Status: AC

## 2016-10-12 MED FILL — ?HYDROCHLOROTHIAZIDE 12.5MG: 12.5 | 30 days supply | Qty: 30 | Fill #1

## 2016-10-12 MED FILL — BUPROPION HCL XL 150 MG TAB: 150 | 30 days supply | Qty: 30 | Fill #1

## 2016-10-12 NOTE — Progress Notes (Unsigned)
Patient requested an anxiolytic prior to MRI (which was ordered by a chiropractor). Ready for pickup.

## 2016-10-12 NOTE — Telephone Encounter (Signed)
Dr. Venetia Night printed and signed a prescription for patient who requested it for her MRI.  Prescription placed on [facility], [employer].  Writer saw that the procedure was this morning and had been cancelled.

## 2016-10-13 NOTE — Progress Notes (Signed)
Pt was called and informed of script being ready for pick up.

## 2016-10-25 ENCOUNTER — Encounter: Payer: Self-pay | Admitting: Family Medicine

## 2016-10-25 ENCOUNTER — Ambulatory Visit: Payer: No Typology Code available for payment source | Attending: Family Medicine | Admitting: Family Medicine

## 2016-10-25 VITALS — BP 117/78 | HR 113 | Temp 98.4°F | Ht 64.0 in | Wt 369.2 lb

## 2016-10-25 DIAGNOSIS — R51 Headache: Secondary | ICD-10-CM | POA: Diagnosis not present

## 2016-10-25 DIAGNOSIS — J4540 Moderate persistent asthma, uncomplicated: Secondary | ICD-10-CM | POA: Diagnosis not present

## 2016-10-25 DIAGNOSIS — M25572 Pain in left ankle and joints of left foot: Secondary | ICD-10-CM | POA: Insufficient documentation

## 2016-10-25 DIAGNOSIS — G8929 Other chronic pain: Secondary | ICD-10-CM

## 2016-10-25 DIAGNOSIS — M25552 Pain in left hip: Secondary | ICD-10-CM | POA: Diagnosis not present

## 2016-10-25 DIAGNOSIS — R55 Syncope and collapse: Secondary | ICD-10-CM | POA: Diagnosis not present

## 2016-10-25 DIAGNOSIS — M25561 Pain in right knee: Secondary | ICD-10-CM | POA: Diagnosis not present

## 2016-10-25 DIAGNOSIS — Z3201 Encounter for pregnancy test, result positive: Secondary | ICD-10-CM

## 2016-10-25 DIAGNOSIS — F0781 Postconcussional syndrome: Secondary | ICD-10-CM | POA: Diagnosis not present

## 2016-10-25 DIAGNOSIS — Z6841 Body Mass Index (BMI) 40.0 and over, adult: Secondary | ICD-10-CM | POA: Insufficient documentation

## 2016-10-25 DIAGNOSIS — I10 Essential (primary) hypertension: Secondary | ICD-10-CM

## 2016-10-25 LAB — POCT URINE PREGNANCY: PREG TEST UR: NEGATIVE

## 2016-10-25 MED ORDER — FEXOFENADINE HCL 180 MG PO TABS: 180.0000 mg | ORAL_TABLET | Freq: Every day | ORAL | 5 refills | Status: DC

## 2016-10-25 MED ORDER — ACETAMINOPHEN-CODEINE #3 300-30 MG PO TABS: 1.0000 | ORAL_TABLET | Freq: Four times a day (QID) | ORAL | 2 refills | Status: DC | PRN

## 2016-10-25 MED ORDER — PREDNISONE 20 MG PO TABS: ORAL_TABLET | ORAL | 0 refills | Status: DC

## 2016-10-25 MED ORDER — PROMETHAZINE HCL 25 MG PO TABS: 25.0000 mg | ORAL_TABLET | Freq: Three times a day (TID) | ORAL | 0 refills | Status: DC | PRN

## 2016-10-25 NOTE — Assessment & Plan Note (Signed)
Well controlled Continue HCTZ 12.5 mg daily Increase hydration

## 2016-10-25 NOTE — Patient Instructions (Addendum)
Courtney Zamora was seen today for hypertension.  Diagnoses and all orders for this visit:  Positive pregnancy test -     POCT urine pregnancy  Moderate persistent asthma without complication -     fexofenadine (ALLEGRA ALLERGY) 180 MG tablet; Take 1 tablet (180 mg total) by mouth daily. -     predniSONE (DELTASONE) 20 MG tablet; Take by mouth 3 for 2 days, then 2 for 2 days, then 1 for 3 days then STOP  Post concussion syndrome -     promethazine (PHENERGAN) 25 MG tablet; Take 1 tablet (25 mg total) by mouth every 8 (eight) hours as needed for nausea or vomiting.   pregnancy test is negative here today  Change zyrtec to allegra Prednisone taper  Phenergan for nausea Avoid diclofenac and any other NSAIDs   Push back colposcopy for one month   f/u in 3 weeks for possible pregnancy and post-concussive syndrome    Dr. Armen Pickup
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
## 2016-10-25 NOTE — Assessment & Plan Note (Signed)
Positive home test Negative office test Patient desires pregnancy x 12 years Plan: Prenatal vitamin No NSAID Close f/u

## 2016-10-27 ENCOUNTER — Ambulatory Visit: Payer: Self-pay | Admitting: Family Medicine

## 2016-10-28 ENCOUNTER — Other Ambulatory Visit: Payer: Self-pay | Admitting: Family Medicine

## 2016-10-28 MED FILL — ACETAMINOPHEN/COD #3 TABLET: 300-30 | 22 days supply | Qty: 90 | Fill #0

## 2016-10-31 ENCOUNTER — Other Ambulatory Visit: Payer: Self-pay | Admitting: Family Medicine

## 2016-10-31 DIAGNOSIS — F0781 Postconcussional syndrome: Secondary | ICD-10-CM

## 2016-10-31 DIAGNOSIS — J4540 Moderate persistent asthma, uncomplicated: Secondary | ICD-10-CM

## 2016-10-31 MED ORDER — PREDNISONE 20 MG PO TABS: ORAL_TABLET | ORAL | 0 refills | Status: DC

## 2016-10-31 MED ORDER — PROMETHAZINE HCL 25 MG PO TABS: 25.0000 mg | ORAL_TABLET | Freq: Three times a day (TID) | ORAL | 0 refills | Status: DC | PRN

## 2016-10-31 MED FILL — ?CETIRIZINE HCL 10 MG TABLE: 10 | 30 days supply | Qty: 30 | Fill #6

## 2016-10-31 MED FILL — PANTOPRAZOLE SOD DR 40 MG T: 40 | 30 days supply | Qty: 30 | Fill #3

## 2016-10-31 MED FILL — GABAPENTIN 300 MG CAPSULE: 300 | 30 days supply | Qty: 90 | Fill #0

## 2016-10-31 MED FILL — $Symbicort 160-4.5mcg/act: 160-4.5 | 30 days supply | Qty: 1 | Fill #3

## 2016-10-31 MED FILL — PROMETHAZINE 25 MG TABLET: 25 | 6 days supply | Qty: 20 | Fill #0

## 2016-10-31 MED FILL — FLUTICASONE PROP 50 MCG SPR: 50 | 15 days supply | Qty: 16 | Fill #1

## 2016-10-31 MED FILL — ?PREDNISONE 20 MG TABLET: 20 | 7 days supply | Qty: 13 | Fill #0

## 2016-10-31 MED FILL — tiZANidine HCL 4 MG TABS: 4 | 27 days supply | Qty: 110 | Fill #1

## 2016-10-31 MED FILL — DICLOFENAC SOD DR 75 MG TAB: 75 | 30 days supply | Qty: 60 | Fill #1

## 2016-11-09 MED FILL — ?HYDROCHLOROTHIAZIDE 12.5MG: 12.5 | 30 days supply | Qty: 30 | Fill #2

## 2016-11-09 MED FILL — ?BUPROPION HCL XL 150 MG TA: 150 | 30 days supply | Qty: 30 | Fill #2

## 2016-11-11 ENCOUNTER — Telehealth: Payer: Self-pay | Admitting: Family Medicine
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
## 2016-11-13 ENCOUNTER — Other Ambulatory Visit: Payer: Self-pay | Admitting: Family Medicine

## 2016-11-13 DIAGNOSIS — F0781 Postconcussional syndrome: Secondary | ICD-10-CM

## 2016-11-14 NOTE — Telephone Encounter (Signed)
Refill request

## 2016-11-15 ENCOUNTER — Emergency Department
Admission: EM | Admit: 2016-11-15 | Discharge: 2016-11-15 | Disposition: A | Discharge status: Home / Self Care | Payer: Self-pay | Attending: Emergency Medicine | Admitting: Emergency Medicine

## 2016-11-15 ENCOUNTER — Emergency Department: Payer: Self-pay

## 2016-11-15 ENCOUNTER — Encounter: Payer: Self-pay | Admitting: *Deleted

## 2016-11-15 DIAGNOSIS — M84374A Stress fracture, right foot, initial encounter for fracture: Secondary | ICD-10-CM | POA: Insufficient documentation

## 2016-11-15 DIAGNOSIS — Z79899 Other long term (current) drug therapy: Secondary | ICD-10-CM | POA: Insufficient documentation

## 2016-11-15 DIAGNOSIS — J4530 Mild persistent asthma, uncomplicated: Secondary | ICD-10-CM | POA: Insufficient documentation

## 2016-11-15 DIAGNOSIS — I10 Essential (primary) hypertension: Secondary | ICD-10-CM | POA: Insufficient documentation

## 2016-11-15 MED ORDER — NAPROXEN 500 MG PO TABS: 500.0000 mg | ORAL_TABLET | Freq: Two times a day (BID) | ORAL | 0 refills | Status: DC

## 2016-11-15 NOTE — Discharge Instructions (Signed)
Unable to write for Narcotic pain medication 2nd to your previous prescriptions. Continue your regular pain mediation and take Naproxen as directed.

## 2016-11-15 NOTE — ED Notes (Signed)
See triage note  Having pain to right foot states pain is from side of foot   Pain moves up to ankle   Swelling noted to foot  Denies any injury

## 2016-11-15 NOTE — ED Provider Notes (Signed)
[facility] Emergency Department Provider Note   ____________________________________________   First MD Initiated Contact with Patient 11/15/16 1155     (approximate)  I have reviewed the triage vital signs and the nursing notes.   HISTORY  Chief Complaint Ankle Pain    HPI Courtney Zamora is a 31 y.o. female patient complaining of right foot painwithout provocative incident for 2-3 days.patient also complaining of swelling to the dorsal aspect of the foot. Patient has moderate tenderness to the medial lateral aspect of the fo Patient stated pain increases with weightbearing and ambulation.No palliative measures for this complaint. Patient rates the pain as 8/10. Patient described a pain as "achy".   Past Medical History:  Diagnosis Date  . Anxiety   . Asthma   . Depression   . Obesity   . Obesity   . Seizures [facility])     Patient Active Problem List   Diagnosis Date Noted  . Positive pregnancy test 10/25/2016  . Post concussion syndrome 10/25/2016  . Syncope 10/05/2016  . Neck muscle spasm 10/05/2016  . Nipple discharge in female 09/13/2016  . Depression 08/17/2016  . Anxiety 08/17/2016  . ASCUS with positive high risk HPV cervical 08/14/2016  . Left foot pain 08/14/2016  . Hypertension 08/14/2016  . Long term (current) use of systemic steroids 08/14/2016  . Vulvar pain 07/28/2016  . Weight gain 01/12/2016  . Esophageal reflux 12/29/2015  . Morbid obesity (HCC) 12/29/2015  . Chronic pain of right knee 12/29/2015  . Chronic pain of left ankle 12/29/2015  . 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 Asthma, moderate persistent 12/09/2015    Past Surgical History:  Procedure Laterality Date  . TONSILLECTOMY      Prior to Admission medications   Medication Sig Start Date End Date Taking? Authorizing Provider  acetaminophen-codeine (TYLENOL #3) 300-30 MG tablet Take 1 tablet by mouth every 6 (six) hours as needed. for pain 10/25/16   Dessa Phi, MD  acetaminophen-codeine (TYLENOL #4) 300-60 MG tablet Take 1 tablet by mouth every 6 (six) hours as needed for moderate pain. 07/25/16   Josalyn Funches, MD  albuterol (PROVENTIL HFA;VENTOLIN HFA) 108 (90 Base) MCG/ACT inhaler Inhale 2 puffs into the lungs every 6 (six) hours as needed for wheezing or shortness of breath (wheezing). For shortness of breath. 03/18/16   Josalyn Funches, MD  albuterol (PROVENTIL) (2.5 MG/3ML) 0.083% nebulizer solution Take 3 mLs (2.5 mg total) by nebulization every 6 (six) hours as needed for wheezing or shortness of breath. 10/04/16   Dessa Phi, MD  budesonide-formoterol (SYMBICORT) 160-4.5 MCG/ACT inhaler Inhale 2 puffs into the lungs 2 (two) times daily. 03/18/16   Josalyn Funches, MD  buPROPion (WELLBUTRIN XL) 150 MG 24 hr tablet Take 1 tablet (150 mg total) by mouth daily. 09/13/16   Dessa Phi, MD  Calcium Citrate 200 MG TABS Take 2 tablets (400 mg total) by mouth every other day. 10/05/16   Newt Lukes, MD  cetirizine (ZYRTEC) 10 MG tablet Take 1 tablet (10 mg total) by mouth daily. 03/18/16   Josalyn Funches, MD  Cholecalciferol (VITAMIN D3) 5000 units CAPS Take 5,000 Units by mouth once a week.    Historical Provider, MD  fexofenadine (ALLEGRA ALLERGY) 180 MG tablet Take 1 tablet (180 mg total) by mouth daily. 10/25/16   Josalyn Funches, MD  fluticasone (FLONASE) 50 MCG/ACT nasal spray Place 2 sprays into both nostrils 2 (two) times daily. 07/06/16   Storm Frisk, MD  gabapentin (NEURONTIN)  300 MG capsule Take 1 capsule (300 mg total) by mouth 3 (three) times daily. 10/05/16   Newt Lukes, MD  hydrochlorothiazide (HYDRODIURIL) 12.5 MG tablet Take 1 tablet (12.5 mg total) by mouth daily. 09/13/16   Dessa Phi, MD  methocarbamol (ROBAXIN-750) 750 MG tablet Take 1 tablet (750 mg total) by mouth every 8 (eight) hours as needed for muscle spasms. 10/05/16   Newt Lukes, MD  naproxen (NAPROSYN) 500 MG tablet Take 1 tablet (500 mg  total) by mouth 2 (two) times daily with a meal. 11/15/16   Joni Reining, PA-C  pantoprazole (PROTONIX) 40 MG tablet Take 1 tablet (40 mg total) by mouth daily. 07/25/16   Josalyn Funches, MD  predniSONE (DELTASONE) 20 MG tablet Take by mouth 3 for 2 days, then 2 for 2 days, then 1 for 3 days then STOP 10/31/16   Josalyn Funches, MD  promethazine (PHENERGAN) 25 MG tablet Take 1 tablet (25 mg total) by mouth every 8 (eight) hours as needed for nausea or vomiting. 10/31/16   Josalyn Funches, MD  tiZANidine (ZANAFLEX) 4 MG tablet TAKE 1 TABLET BY MOUTH EVERY 6 HOURS AS NEEDED FOR MUSCLE SPASMS. 08/22/16   Dessa Phi, MD  traMADol (ULTRAM) 50 MG tablet Take 1 tablet (50 mg total) by mouth every 6 (six) hours as needed for moderate pain or severe pain. 09/27/16   Loren Racer, MD    Allergies Bee venom; Cinnamon; Ibuprofen; and Tape  Family History  Problem Relation Age of Onset  . Hypothyroidism Mother   . Diabetes Father   . Diabetes Paternal Uncle   . Hypertension Maternal Grandmother   . Hypertension Maternal Grandfather   . Pancreatic cancer Maternal Grandfather   . Hypertension Paternal Grandmother   . Heart disease Paternal Grandfather   . Hypertension Paternal Grandfather     Social History Social History  Substance Use Topics  . Smoking status: Never Smoker  . Smokeless tobacco: Never Used  . Alcohol use No    Review of Systems Constitutional: No fever/chills Eyes: No visual changes. ENT: No sore throat. Cardiovascular: Denies chest pain. Respiratory: Denies shortness of breath. Gastrointestinal: No abdominal pain.  No nausea, no vomiting.  No diarrhea.  No constipation. Genitourinary: Negative for dysuria. Musculoskeletal: right foot pain Skin: Negative for rash. Neurological: Negative for headaches, focal weakness or numbness. Seizure disorder Psychiatric:anxiety and depression  ____________________________________________   PHYSICAL EXAM:  VITAL SIGNS: ED  Triage Vitals [11/15/16 1139]  Enc Vitals Group     BP (!) 151/74     Pulse Rate 97     Resp 18     Temp 98.4 F (36.9 C)     Temp Source Oral     SpO2 99 %     Weight (!) 368 lb (166.9 kg)     Height 5\' 4"  (1.626 m)     Head Circumference      Peak Flow      Pain Score 8     Pain Loc      Pain Edu?      Excl. in GC?     Constitutional: Alert and oriented. Well appearing and in no acute distress. Eyes: Conjunctivae are normal. PERRL. EOMI. Morbid obesity Head: Atraumatic. Nose: No congestion/rhinnorhea. Mouth/Throat: Mucous membranes are moist.  Oropharynx non-erythematous. Neck: No stridor.  No cervical spine tenderness to palpation. Hematological/Lymphatic/Immunilogical: No cervical lymphadenopathy. Cardiovascular: Normal rate, regular rhythm. Grossly normal heart sounds.  Good peripheral circulation. Respiratory: Normal respiratory effort.  No  retractions. Lungs CTAB. Gastrointestinal: Soft and nontender. No distention. No abdominal bruits. No CVA tenderness. Musculoskeletal: no obvious deformity. Bilateral edema to the feet and ankle..  No joint effusions. Moderate guarding palpation of the fourth and fifth metatarsal head. Neurologic:  Normal speech and language. No gross focal neurologic deficits are appreciated. No gait instability. Skin:  Skin is warm, dry and intact. No rash noted. Psychiatric: Mood and affect are normal. Speech and behavior are normal.  ____________________________________________   LABS (all labs ordered are listed, but only abnormal results are displayed)  Labs Reviewed - No data to display ____________________________________________  EKG   ____________________________________________  RADIOLOGY   _x-rays reveal nondisplaced fractures base of the second metatarsal___________________________________________   PROCEDURES  Procedure(s) performed: None  Procedures  Critical Care performed:  No  ____________________________________________   INITIAL IMPRESSION / ASSESSMENT AND PLAN / ED COURSE  Pertinent labs & imaging results that were available during my care of the patient were reviewed by me and considered in my medical decision making (see chart for details).  Right foot fracture. Patient given discharge care instructions. Patient placed an open shoe. Patient given a prescription for naproxen since she is on chronic narcotic pain medication. Patient advises no improvement follow with podiatry.      ____________________________________________   FINAL CLINICAL IMPRESSION(S) / ED DIAGNOSES  Final diagnoses:  Stress fracture of right foot, initial encounter      NEW MEDICATIONS STARTED DURING THIS VISIT:  New Prescriptions   NAPROXEN (NAPROSYN) 500 MG TABLET    Take 1 tablet (500 mg total) by mouth 2 (two) times daily with a meal.     Note:  This document was prepared using Dragon voice recognition software and may include unintentional dictation errors.    Joni Reining, PA-C 11/15/16 1310    Jene Every, MD 11/15/16 1318

## 2016-11-15 NOTE — ED Triage Notes (Signed)
Pt states right ankle pain for several days getting worse, denies any known injury

## 2016-11-15 NOTE — ED Notes (Signed)
Received phone call from pt, who was angry about her care. She stated that she has a fracture and that she was given sub-par care. She stated that her instructions mention using crutches, but that she wasn't give any. None were ordered. Pt was advised to return if she needed to be seen again. She stated this hospital should be shut down.

## 2016-11-17 ENCOUNTER — Encounter: Payer: Self-pay | Admitting: Family Medicine

## 2016-11-17 ENCOUNTER — Ambulatory Visit: Payer: Self-pay | Attending: Family Medicine | Admitting: Family Medicine

## 2016-11-17 VITALS — BP 119/75 | HR 108 | Temp 97.8°F | Ht 64.0 in | Wt 368.4 lb

## 2016-11-17 DIAGNOSIS — F329 Major depressive disorder, single episode, unspecified: Secondary | ICD-10-CM | POA: Insufficient documentation

## 2016-11-17 DIAGNOSIS — I10 Essential (primary) hypertension: Secondary | ICD-10-CM | POA: Insufficient documentation

## 2016-11-17 DIAGNOSIS — M79671 Pain in right foot: Secondary | ICD-10-CM | POA: Insufficient documentation

## 2016-11-17 DIAGNOSIS — M25572 Pain in left ankle and joints of left foot: Secondary | ICD-10-CM | POA: Insufficient documentation

## 2016-11-17 DIAGNOSIS — Z79899 Other long term (current) drug therapy: Secondary | ICD-10-CM | POA: Insufficient documentation

## 2016-11-17 DIAGNOSIS — N912 Amenorrhea, unspecified: Secondary | ICD-10-CM | POA: Insufficient documentation

## 2016-11-17 DIAGNOSIS — F0781 Postconcussional syndrome: Secondary | ICD-10-CM | POA: Insufficient documentation

## 2016-11-17 DIAGNOSIS — J4540 Moderate persistent asthma, uncomplicated: Secondary | ICD-10-CM | POA: Insufficient documentation

## 2016-11-17 DIAGNOSIS — G8929 Other chronic pain: Secondary | ICD-10-CM | POA: Insufficient documentation

## 2016-11-17 DIAGNOSIS — Z6841 Body Mass Index (BMI) 40.0 and over, adult: Secondary | ICD-10-CM | POA: Insufficient documentation

## 2016-11-17 DIAGNOSIS — M25561 Pain in right knee: Secondary | ICD-10-CM | POA: Insufficient documentation

## 2016-11-17 DIAGNOSIS — Z7951 Long term (current) use of inhaled steroids: Secondary | ICD-10-CM | POA: Insufficient documentation

## 2016-11-17 DIAGNOSIS — M84374S Stress fracture, right foot, sequela: Secondary | ICD-10-CM | POA: Insufficient documentation

## 2016-11-17 DIAGNOSIS — X58XXXS Exposure to other specified factors, sequela: Secondary | ICD-10-CM | POA: Insufficient documentation

## 2016-11-17 LAB — POCT URINE PREGNANCY: Preg Test, Ur: NEGATIVE

## 2016-11-17 MED ORDER — PROMETHAZINE HCL 25 MG PO TABS: 25.0000 mg | ORAL_TABLET | Freq: Three times a day (TID) | ORAL | 2 refills | Status: DC | PRN

## 2016-11-17 MED ORDER — ACETAMINOPHEN-CODEINE #4 300-60 MG PO TABS: 1.0000 | ORAL_TABLET | Freq: Four times a day (QID) | ORAL | 2 refills | Status: DC | PRN

## 2016-11-17 MED ORDER — ALBUTEROL SULFATE (2.5 MG/3ML) 0.083% IN NEBU: 2.5000 mg | INHALATION_SOLUTION | Freq: Four times a day (QID) | RESPIRATORY_TRACT | 1 refills | Status: DC | PRN

## 2016-11-17 MED ORDER — CRUTCHES-ALUMINUM MISC: 1.0000 | Freq: Every day | 0 refills | Status: DC

## 2016-11-17 MED ORDER — BUPROPION HCL ER (XL) 300 MG PO TB24: 300.0000 mg | ORAL_TABLET | Freq: Every day | ORAL | 2 refills | Status: DC

## 2016-11-17 MED ORDER — PREDNISONE 20 MG PO TABS: ORAL_TABLET | ORAL | 0 refills | Status: DC

## 2016-11-17 MED FILL — ?BUPROPION HCL XL 300 MG TA: 300 | 30 days supply | Qty: 30 | Fill #0

## 2016-11-17 MED FILL — ?PREDNISONE 20 MG TABLET: 20 | 7 days supply | Qty: 13 | Fill #0

## 2016-11-17 MED FILL — PROMETHAZINE 25 MG TABLET: 25 | 30 days supply | Qty: 90 | Fill #0
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
## 2016-11-17 NOTE — Patient Instructions (Addendum)
Diagnoses and all orders for this visit:  Reactive depression -     buPROPion (WELLBUTRIN XL) 300 MG 24 hr tablet; Take 1 tablet (300 mg total) by mouth daily.  Post concussion syndrome -     promethazine (PHENERGAN) 25 MG tablet; Take 1 tablet (25 mg total) by mouth every 8 (eight) hours as needed for nausea or vomiting.  Chronic pain of right knee -     acetaminophen-codeine (TYLENOL #4) 300-60 MG tablet; Take 1 tablet by mouth every 6 (six) hours as needed for moderate pain.  Chronic pain of left ankle -     acetaminophen-codeine (TYLENOL #4) 300-60 MG tablet; Take 1 tablet by mouth every 6 (six) hours as needed for moderate pain.  Amenorrhea -     POCT urine pregnancy  Metatarsal stress fracture, right, sequela -     predniSONE (DELTASONE) 20 MG tablet; Take by mouth 3 for 2 days, then 2 for 2 days, then 1 for 3 days then STOP -     Misc. Devices (CRUTCHES-ALUMINUM) MISC; 1 each by Does not apply route daily.  Moderate persistent asthma without complication -     albuterol (PROVENTIL) (2.5 MG/3ML) 0.083% nebulizer solution; Take 3 mLs (2.5 mg total) by nebulization every 6 (six) hours as needed for wheezing or shortness of breath.   f/u in 6 weeks for R foot pain  Dr. Armen Pickup

## 2016-11-17 NOTE — Assessment & Plan Note (Signed)
A: R foot pain, possible undisplaced stress fracture R 2nd metatarsal P: Tylenol #4 for pain Prednisone taper CAM walker and crutches

## 2016-11-17 NOTE — Assessment & Plan Note (Signed)
A: depression with suicidal ideation that has resolved per patient P: Increase Wellbutrin to 300 mg daily Patient referred to [facility] of the [place]

## 2016-11-18 MED FILL — ACETAMINOPHEN/COD #3 TABLET: 300-30 | 22 days supply | Qty: 90 | Fill #1

## 2016-11-19 ENCOUNTER — Encounter (HOSPITAL_COMMUNITY): Payer: Self-pay | Admitting: Nurse Practitioner

## 2016-11-19 ENCOUNTER — Emergency Department (HOSPITAL_COMMUNITY): Payer: Self-pay

## 2016-11-19 ENCOUNTER — Emergency Department (HOSPITAL_COMMUNITY)
Admission: EM | Admit: 2016-11-19 | Discharge: 2016-11-19 | Disposition: A | Discharge status: Home / Self Care | Payer: Self-pay | Attending: Emergency Medicine | Admitting: Emergency Medicine

## 2016-11-19 DIAGNOSIS — I10 Essential (primary) hypertension: Secondary | ICD-10-CM | POA: Insufficient documentation

## 2016-11-19 DIAGNOSIS — J45901 Unspecified asthma with (acute) exacerbation: Secondary | ICD-10-CM | POA: Insufficient documentation

## 2016-11-19 DIAGNOSIS — Z79899 Other long term (current) drug therapy: Secondary | ICD-10-CM | POA: Insufficient documentation

## 2016-11-19 DIAGNOSIS — R0602 Shortness of breath: Secondary | ICD-10-CM

## 2016-11-19 DIAGNOSIS — R0789 Other chest pain: Secondary | ICD-10-CM | POA: Insufficient documentation

## 2016-11-19 MED ORDER — IPRATROPIUM-ALBUTEROL 0.5-2.5 (3) MG/3ML IN SOLN
3.0000 mL | Freq: Once | RESPIRATORY_TRACT | Status: AC
  Administered 2016-11-19: 3 mL via RESPIRATORY_TRACT
  Filled 2016-11-19: qty 3

## 2016-11-19 MED ORDER — HYDROCODONE-ACETAMINOPHEN 5-325 MG PO TABS
1.0000 | ORAL_TABLET | Freq: Once | ORAL | Status: AC
  Administered 2016-11-19: 1 via ORAL
  Filled 2016-11-19: qty 1

## 2016-11-19 NOTE — ED Triage Notes (Signed)
Patient states she went to lay down and started feeling shortness of breath and as if she was having an asthma attack. States she got back up and took her rescue inhaler (symbicort and albuterol). Stated when she took them her chest was burning really bad and her dad suggested she come in to be seen. Denies any headache or dizziness. Breathing appears calm, no dyspnea on rest, and patient able to talk in full sentences without shortness of breath. States she is having right lower lung/flank pain.

## 2016-11-19 NOTE — ED Provider Notes (Signed)
[facility]-EMERGENCY DEPT Provider Note   CSN: [id number] Arrival date & time: 11/19/16  0303   By signing my name below, I, Courtney Zamora, attest that this documentation has been prepared under the direction and in the presence of [name], MD. Electronically Signed: Soijett Zamora, ED Scribe. 11/19/16. 3:59 AM.  History   Chief Complaint Chief Complaint  Patient presents with  . Asthma    HPI Courtney Zamora is a 31 y.o. female with a PMHx of asthma, who presents to the Emergency Department complaining of asthma exacerbation onset 1.5 hours ago. She reports associated chest tightness and burning sensation to right lower chest. Pt right lower chest is worsened with palpation. She has tried albuterol rescue inhaler and Symbicort with no relief of his symptoms. Pt states that she began to have an asthma attack PTA and used her Rx albuterol rescue inhaler and Rx symbicort. She reports that she noticed a "burning" sensation to her right lower lung following using her prescription medications. Denies having similar symptoms following taking these medications in the past. She denies fever, vomiting, hemoptysis, abdominal pain, and any other symptoms. Denies smoking cigarettes.     The history is provided by the patient. No language interpreter was used.  Asthma  This is a recurrent problem. The current episode started 1 to 2 hours ago. The problem occurs every several days. The problem has not changed since onset.Pertinent negatives include no abdominal pain. Nothing aggravates the symptoms. Nothing relieves the symptoms. Treatments tried: albuterol inhaler and symbicort. The treatment provided no relief.    Past Medical History:  Diagnosis Date  . Anxiety   . Asthma   . Depression   . Obesity   . Obesity   . Seizures [facility])     Patient Active Problem List   Diagnosis Date Noted  . Metatarsal stress fracture, right, sequela 11/17/2016  . Post concussion syndrome 10/25/2016  . Syncope  10/05/2016  . Neck muscle spasm 10/05/2016  . Nipple discharge in female 09/13/2016  . Depression 08/17/2016  . Anxiety 08/17/2016  . ASCUS with positive high risk HPV cervical 08/14/2016  . Left foot pain 08/14/2016  . Hypertension 08/14/2016  . Long term (current) use of systemic steroids 08/14/2016  . Weight gain 01/12/2016  . Esophageal reflux 12/29/2015  . Morbid obesity (HCC) 12/29/2015  . Chronic pain of right knee 12/29/2015  . Chronic pain of left ankle 12/29/2015  . 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 Asthma, moderate persistent 12/09/2015    Past Surgical History:  Procedure Laterality Date  . TONSILLECTOMY      OB History    Gravida Para Term Preterm AB Living   1       1 0   SAB TAB Ectopic Multiple Live Births   1               Home Medications    Prior to Admission medications   Medication Sig Start Date End Date Taking? Authorizing Provider  acetaminophen-codeine (TYLENOL #3) 300-30 MG tablet Take 1 tablet by mouth every 6 (six) hours as needed. for pain 10/25/16   Dessa Phi, MD  acetaminophen-codeine (TYLENOL #4) 300-60 MG tablet Take 1 tablet by mouth every 6 (six) hours as needed for moderate pain. 11/17/16   Funches, Gerilyn Nestle, MD  albuterol (PROVENTIL HFA;VENTOLIN HFA) 108 (90 Base) MCG/ACT inhaler Inhale 2 puffs into the lungs every 6 (six) hours as needed for wheezing or shortness of breath (wheezing). For shortness of breath. 03/18/16  Funches, Josalyn, MD  albuterol (PROVENTIL) (2.5 MG/3ML) 0.083% nebulizer solution Take 3 mLs (2.5 mg total) by nebulization every 6 (six) hours as needed for wheezing or shortness of breath. 11/17/16   Funches, Gerilyn Nestle, MD  budesonide-formoterol (SYMBICORT) 160-4.5 MCG/ACT inhaler Inhale 2 puffs into the lungs 2 (two) times daily. 03/18/16   Funches, Gerilyn Nestle, MD  buPROPion (WELLBUTRIN XL) 300 MG 24 hr tablet Take 1 tablet (300 mg total) by mouth daily. 11/17/16   Funches, Gerilyn Nestle, MD  Calcium Citrate 200 MG TABS Take 2  tablets (400 mg total) by mouth every other day. 10/05/16   Newt Lukes, MD  cetirizine (ZYRTEC) 10 MG tablet Take 1 tablet (10 mg total) by mouth daily. 03/18/16   Funches, Gerilyn Nestle, MD  Cholecalciferol (VITAMIN D3) 5000 units CAPS Take 5,000 Units by mouth once a week.    [provider]  fexofenadine (ALLEGRA ALLERGY) 180 MG tablet Take 1 tablet (180 mg total) by mouth daily. 10/25/16   Funches, Gerilyn Nestle, MD  fluticasone (FLONASE) 50 MCG/ACT nasal spray Place 2 sprays into both nostrils 2 (two) times daily. 07/06/16   Storm Frisk, MD  gabapentin (NEURONTIN) 300 MG capsule Take 1 capsule (300 mg total) by mouth 3 (three) times daily. 10/05/16   Newt Lukes, MD  hydrochlorothiazide (HYDRODIURIL) 12.5 MG tablet Take 1 tablet (12.5 mg total) by mouth daily. 09/13/16   Funches, Gerilyn Nestle, MD  methocarbamol (ROBAXIN-750) 750 MG tablet Take 1 tablet (750 mg total) by mouth every 8 (eight) hours as needed for muscle spasms. 10/05/16   Newt Lukes, MD  Misc. Devices (CRUTCHES-ALUMINUM) MISC 1 each by Does not apply route daily. 11/17/16   Dessa Phi, MD  naproxen (NAPROSYN) 500 MG tablet Take 1 tablet (500 mg total) by mouth 2 (two) times daily with a meal. 11/15/16   Joni Reining, PA-C  pantoprazole (PROTONIX) 40 MG tablet Take 1 tablet (40 mg total) by mouth daily. 07/25/16   Funches, Gerilyn Nestle, MD  predniSONE (DELTASONE) 20 MG tablet Take by mouth 3 for 2 days, then 2 for 2 days, then 1 for 3 days then STOP 11/17/16   Funches, Gerilyn Nestle, MD  promethazine (PHENERGAN) 25 MG tablet Take 1 tablet (25 mg total) by mouth every 8 (eight) hours as needed for nausea or vomiting. 11/17/16   Funches, Gerilyn Nestle, MD  tiZANidine (ZANAFLEX) 4 MG tablet TAKE 1 TABLET BY MOUTH EVERY 6 HOURS AS NEEDED FOR MUSCLE SPASMS. 08/22/16   Dessa Phi, MD    Family History Family History  Problem Relation Age of Onset  . Hypothyroidism Mother   . Diabetes Father   . Diabetes Paternal Uncle   .  Hypertension Maternal Grandmother   . Hypertension Maternal Grandfather   . Pancreatic cancer Maternal Grandfather   . Hypertension Paternal Grandmother   . Heart disease Paternal Grandfather   . Hypertension Paternal Grandfather     Social History Social History  Substance Use Topics  . Smoking status: Never Smoker  . Smokeless tobacco: Never Used  . Alcohol use No     Allergies   Bee venom; Cinnamon; Ibuprofen; and Tape   Review of Systems Review of Systems  Constitutional: Negative for fever.  Respiratory: Positive for chest tightness.        +"burning" sensation to right lower chest  Gastrointestinal: Negative for abdominal pain and vomiting.  All other systems reviewed and are negative.    Physical Exam Updated Vital Signs BP (!) 145/94 (BP Location: Right Arm)  Pulse (!) 105   Temp 98 F (36.7 C) (Oral)   Resp (!) 22   Ht 5\' 4"  (1.626 m)   Wt (!) 368 lb (166.9 kg)   LMP 11/09/2016 (Approximate)   SpO2 99%   BMI 63.17 kg/m   Physical Exam CONSTITUTIONAL: Well developed/well nourished HEAD: Normocephalic/atraumatic EYES: EOMI/PERRL ENMT: Mucous membranes moist NECK: supple no meningeal signs SPINE/BACK:entire spine nontender CV: S1/S2 noted, no murmurs/rubs/gallops noted LUNGS: decreased breath sounds bilaterally. No distress noted.  CHEST: right lower chest wall tenderness noted.  ABDOMEN: soft, mild RUQ tenderness exam limited due to morbid obesity, no rebound or guarding, bowel sounds noted throughout abdomen GU:no cva tenderness NEURO: Pt is awake/alert/appropriate, moves all extremitiesx4.  No facial droop.   EXTREMITIES: pulses normal/equal, full ROM. Post-op shoe on right foot. SKIN: warm, color normal PSYCH: no abnormalities of mood noted, alert and oriented to situation   ED Treatments / Results  DIAGNOSTIC STUDIES: Oxygen Saturation is 99% on RA, nl by my interpretation.    COORDINATION OF CARE: 3:54 AM Discussed treatment plan with  pt at bedside which includes breathing treatment, CXR, and pt agreed to plan.   Labs (all labs ordered are listed, but only abnormal results are displayed) Labs Reviewed - No data to display  EKG  EKG Interpretation None       Radiology Dg Chest 2 View  Result Date: 11/19/2016 CLINICAL DATA:  Dyspnea and wheezing tonight. EXAM: CHEST  2 VIEW COMPARISON:  12/30/2015 FINDINGS: The lungs are clear. The pulmonary vasculature is normal. Heart size is normal. Hilar and mediastinal contours are unremarkable. There is no pleural effusion. IMPRESSION: No active cardiopulmonary disease. Electronically Signed   By: 01/01/2016 M.D.   On: 11/19/2016 04:16    Procedures Procedures (including critical care time)  Medications Ordered in ED Medications  ipratropium-albuterol (DUONEB) 0.5-2.5 (3) MG/3ML nebulizer solution 3 mL (3 mLs Nebulization Given 11/19/16 0417)  HYDROcodone-acetaminophen (NORCO/VICODIN) 5-325 MG per tablet 1 tablet (1 tablet Oral Given 11/19/16 0410)     Initial Impression / Assessment and Plan / ED Course  I have reviewed the triage vital signs and the nursing notes.  Pertinent l  imaging results that were available during my care of the patient were reviewed by me and considered in my medical decision making (see chart for details).     On reassessment pt sleeping No distress No hypoxia She reports feeling improved Main issue was "chest burning" on right sided, but she also had localized chest wall tenderness I offered to perform labs, but she declines reports she has a seizure everytime she sees a needle Due to her improvement/stability will d/c home I doubt PE CXR negative  Final Clinical Impressions(s) / ED Diagnoses   Final diagnoses:  Shortness of breath  Burning chest pain    New Prescriptions New Prescriptions   No medications on file   I personally performed the services described in this documentation, which was scribed in my presence. The  recorded information has been reviewed and is accurate.        01/19/17, MD 11/19/16 [phone]

## 2016-11-20 ENCOUNTER — Emergency Department (HOSPITAL_COMMUNITY)
Admission: EM | Admit: 2016-11-20 | Discharge: 2016-11-20 | Disposition: A | Discharge status: Home / Self Care | Payer: Self-pay | Attending: Emergency Medicine | Admitting: Emergency Medicine

## 2016-11-20 ENCOUNTER — Encounter (HOSPITAL_COMMUNITY): Payer: Self-pay

## 2016-11-20 ENCOUNTER — Emergency Department (HOSPITAL_COMMUNITY): Payer: Self-pay

## 2016-11-20 DIAGNOSIS — Z79899 Other long term (current) drug therapy: Secondary | ICD-10-CM | POA: Insufficient documentation

## 2016-11-20 DIAGNOSIS — J45901 Unspecified asthma with (acute) exacerbation: Secondary | ICD-10-CM

## 2016-11-20 DIAGNOSIS — I10 Essential (primary) hypertension: Secondary | ICD-10-CM | POA: Insufficient documentation

## 2016-11-20 DIAGNOSIS — M84374S Stress fracture, right foot, sequela: Secondary | ICD-10-CM

## 2016-11-20 DIAGNOSIS — J4531 Mild persistent asthma with (acute) exacerbation: Secondary | ICD-10-CM | POA: Insufficient documentation

## 2016-11-20 MED ORDER — PREDNISOLONE 5 MG PO TABS
60.0000 mg | ORAL_TABLET | Freq: Once | ORAL | Status: AC
  Administered 2016-11-20: 60 mg via ORAL
  Filled 2016-11-20: qty 12

## 2016-11-20 MED ORDER — METHYLPREDNISOLONE SODIUM SUCC 125 MG IJ SOLR
125.0000 mg | Freq: Once | INTRAMUSCULAR | Status: DC
  Filled 2016-11-20: qty 2

## 2016-11-20 MED ORDER — PREDNISONE 20 MG PO TABS: ORAL_TABLET | ORAL | 0 refills | Status: DC

## 2016-11-20 NOTE — ED Provider Notes (Signed)
[facility]-EMERGENCY DEPT Provider Note   CSN: [id number] Arrival date & time: 11/20/16  1221     History   Chief Complaint Chief Complaint  Patient presents with  . Asthma    HPI Lacreshia L Dicenso is a 31 y.o. female.  HPI patient presents with acute onset shortness of breath and wheezing starting at 11:00 today while at church. Denies any lower sugary swelling or pain. Received breathing treatment in route by EMS and states her symptoms have significantly improved. No fever or chills. Seen yesterday for similar presentation. Past Medical History:  Diagnosis Date  . Anxiety   . Asthma   . Depression   . Obesity   . Obesity   . Seizures [facility])     Patient Active Problem List   Diagnosis Date Noted  . Metatarsal stress fracture, right, sequela 11/17/2016  . Post concussion syndrome 10/25/2016  . Syncope 10/05/2016  . Neck muscle spasm 10/05/2016  . Nipple discharge in female 09/13/2016  . Depression 08/17/2016  . Anxiety 08/17/2016  . ASCUS with positive high risk HPV cervical 08/14/2016  . Left foot pain 08/14/2016  . Hypertension 08/14/2016  . Long term (current) use of systemic steroids 08/14/2016  . Weight gain 01/12/2016  . Esophageal reflux 12/29/2015  . Morbid obesity (HCC) 12/29/2015  . Chronic pain of right knee 12/29/2015  . Chronic pain of left ankle 12/29/2015  . 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 Asthma, moderate persistent 12/09/2015    Past Surgical History:  Procedure Laterality Date  . TONSILLECTOMY      OB History    Gravida Para Term Preterm AB Living   1       1 0   SAB TAB Ectopic Multiple Live Births   1               Home Medications    Prior to Admission medications   Medication Sig Start Date End Date Taking? Authorizing Provider  acetaminophen-codeine (TYLENOL #3) 300-30 MG tablet Take 1 tablet by mouth every 6 (six) hours as needed. for pain Patient taking differently: Take 1 tablet by mouth every 8 (eight) hours. for pain 10/25/16   Yes Funches, Josalyn, MD  acetaminophen-codeine (TYLENOL #4) 300-60 MG tablet Take 1 tablet by mouth every 6 (six) hours as needed for moderate pain. 11/17/16  Yes Funches, Josalyn, MD  albuterol (PROVENTIL HFA;VENTOLIN HFA) 108 (90 Base) MCG/ACT inhaler Inhale 2 puffs into the lungs every 6 (six) hours as needed for wheezing or shortness of breath (wheezing). For shortness of breath. 03/18/16  Yes Funches, Josalyn, MD  albuterol (PROVENTIL) (2.5 MG/3ML) 0.083% nebulizer solution Take 3 mLs (2.5 mg total) by nebulization every 6 (six) hours as needed for wheezing or shortness of breath. 11/17/16  Yes Funches, Josalyn, MD  budesonide-formoterol (SYMBICORT) 160-4.5 MCG/ACT inhaler Inhale 2 puffs into the lungs 2 (two) times daily. 03/18/16  Yes Funches, Josalyn, MD  buPROPion (WELLBUTRIN XL) 300 MG 24 hr tablet Take 1 tablet (300 mg total) by mouth daily. 11/17/16  Yes Funches, Josalyn, MD  Calcium Citrate 200 MG TABS Take 2 tablets (400 mg total) by mouth every other day. 10/05/16  Yes Newt Lukes, MD  Cholecalciferol (VITAMIN D3) 5000 units CAPS Take 5,000 Units by mouth once a week.   Yes [provider]  fexofenadine (ALLEGRA ALLERGY) 180 MG tablet Take 1 tablet (180 mg total) by mouth daily. 10/25/16  Yes Funches, Josalyn, MD  fluticasone (FLONASE) 50 MCG/ACT nasal spray Place 2 sprays into  both nostrils 2 (two) times daily. 07/06/16  Yes Storm Frisk, MD  gabapentin (NEURONTIN) 300 MG capsule Take 1 capsule (300 mg total) by mouth 3 (three) times daily. 10/05/16  Yes Newt Lukes, MD  hydrochlorothiazide (HYDRODIURIL) 12.5 MG tablet Take 1 tablet (12.5 mg total) by mouth daily. 09/13/16  Yes Funches, Josalyn, MD  pantoprazole (PROTONIX) 40 MG tablet Take 1 tablet (40 mg total) by mouth daily. 07/25/16  Yes Funches, Josalyn, MD  promethazine (PHENERGAN) 25 MG tablet Take 1 tablet (25 mg total) by mouth every 8 (eight) hours as needed for nausea or vomiting. 11/17/16  Yes Funches, Josalyn,  MD  tiZANidine (ZANAFLEX) 4 MG tablet TAKE 1 TABLET BY MOUTH EVERY 6 HOURS AS NEEDED FOR MUSCLE SPASMS. Patient taking differently: Take 4 mg by mouth every 6 (six) hours as needed for muscle spasms.  08/22/16  Yes Funches, Josalyn, MD  cetirizine (ZYRTEC) 10 MG tablet Take 1 tablet (10 mg total) by mouth daily. Patient not taking: Reported on 11/19/2016 03/18/16   Dessa Phi, MD  methocarbamol (ROBAXIN-750) 750 MG tablet Take 1 tablet (750 mg total) by mouth every 8 (eight) hours as needed for muscle spasms. Patient not taking: Reported on 11/19/2016 10/05/16   Newt Lukes, MD  Misc. Devices (CRUTCHES-ALUMINUM) MISC 1 each by Does not apply route daily. 11/17/16   Funches, Gerilyn Nestle, MD  naproxen (NAPROSYN) 500 MG tablet Take 1 tablet (500 mg total) by mouth 2 (two) times daily with a meal. Patient not taking: Reported on 11/19/2016 11/15/16   Joni Reining, PA-C  predniSONE (DELTASONE) 20 MG tablet Take by mouth 3 for 2 days, then 2 for 2 days, then 1 for 3 days then STOP 11/21/16   Loren Racer, MD    Family History Family History  Problem Relation Age of Onset  . Hypothyroidism Mother   . Diabetes Father   . Diabetes Paternal Uncle   . Hypertension Maternal Grandmother   . Hypertension Maternal Grandfather   . Pancreatic cancer Maternal Grandfather   . Hypertension Paternal Grandmother   . Heart disease Paternal Grandfather   . Hypertension Paternal Grandfather     Social History Social History  Substance Use Topics  . Smoking status: Never Smoker  . Smokeless tobacco: Never Used  . Alcohol use No     Allergies   Bee venom; Cinnamon; Ibuprofen; and Tape   Review of Systems Review of Systems  Constitutional: Negative for chills and fever.  Respiratory: Positive for cough, chest tightness, shortness of breath and wheezing.   Cardiovascular: Negative for chest pain, palpitations and leg swelling.  Gastrointestinal: Negative for abdominal pain, nausea and vomiting.    Musculoskeletal: Negative for back pain, myalgias, neck pain and neck stiffness.  Skin: Negative for rash and wound.  Neurological: Negative for dizziness, weakness, light-headedness, numbness and headaches.  All other systems reviewed and are negative.    Physical Exam Updated Vital Signs BP 119/74 (BP Location: Left Wrist)   Pulse 99   Resp 18   Ht 5\' 4"  (1.626 m)   Wt (!) 368 lb (166.9 kg)   LMP 11/09/2016 (Approximate) Comment: NEG preg test on 11/17/16  SpO2 96%   BMI 63.17 kg/m   Physical Exam  Constitutional: She is oriented to person, place, and time. She appears well-developed and well-nourished. No distress.  HENT:  Head: Normocephalic and atraumatic.  Mouth/Throat: Oropharynx is clear and moist.  Eyes: EOM are normal. Pupils are equal, round, and reactive to light.  Neck: Normal range of motion. Neck supple.  Cardiovascular: Normal rate and regular rhythm.   Pulmonary/Chest: Effort normal and breath sounds normal.  Mildly prolonged expiratory phase. No respiratory distress.  Abdominal: Soft. Bowel sounds are normal. There is no tenderness. There is no rebound and no guarding.  Musculoskeletal: Normal range of motion. She exhibits no edema or tenderness.  No calf swelling, asymmetry or tenderness. Distal pulses are intact.  Neurological: She is alert and oriented to person, place, and time.  Moves all extremities without deficit. Sensation fully intact.  Skin: Skin is warm and dry. Capillary refill takes less than 2 seconds. No rash noted. No erythema.  Psychiatric: She has a normal mood and affect. Her behavior is normal.  Nursing note and vitals reviewed.    ED Treatments / Results  Labs (all labs ordered are listed, but only abnormal results are displayed) Labs Reviewed - No data to display  EKG  EKG Interpretation None       Radiology Dg Chest 2 View  Result Date: 11/20/2016 CLINICAL DATA:  31 year old female with shortness of breath, asthma attack  while at church. EXAM: CHEST  2 VIEW COMPARISON:  11/19/2016 and earlier. FINDINGS: Large body habitus. Lung volumes are stable and within normal limits. Mild eventration of the right hemidiaphragm. Mediastinal contours remain normal. Visualized tracheal air column is within normal limits. No pneumothorax, pulmonary edema, pleural effusion or confluent pulmonary opacity. Negative visible bowel gas pattern. No acute osseous abnormality identified. IMPRESSION: Negative.  No acute cardiopulmonary abnormality. Electronically Signed   By: Odessa Fleming M.D.   On: 11/20/2016 14:00   Dg Chest 2 View  Result Date: 11/19/2016 CLINICAL DATA:  Dyspnea and wheezing tonight. EXAM: CHEST  2 VIEW COMPARISON:  12/30/2015 FINDINGS: The lungs are clear. The pulmonary vasculature is normal. Heart size is normal. Hilar and mediastinal contours are unremarkable. There is no pleural effusion. IMPRESSION: No active cardiopulmonary disease. Electronically Signed   By: Ellery Plunk M.D.   On: 11/19/2016 04:16    Procedures Procedures (including critical care time)  Medications Ordered in ED Medications  prednisoLONE tablet 60 mg (60 mg Oral Given 11/20/16 1420)     Initial Impression / Assessment and Plan / ED Course  I have reviewed the triage vital signs and the nursing notes.  Pertinent labs & imaging results that were available during my care of the patient were reviewed by me and considered in my medical decision making (see chart for details).     Chest x-ray without evidence of pneumonia. Vital signs remained stable. Low suspicion for PE or CAD. Will give prednisone taper course outpatient follow-up with her primary physician. Return precautions given.  Final Clinical Impressions(s) / ED Diagnoses   Final diagnoses:  Exacerbation of persistent asthma, unspecified asthma severity    New Prescriptions Current Discharge Medication List       Loren Racer, MD 11/20/16 1450

## 2016-11-20 NOTE — ED Notes (Signed)
Pt refused medication. Pt stated "You can't stick me with any needles, it makes me pass out and have seizures. I don't want to happen." Dr. Ranae Palms made aware.

## 2016-11-20 NOTE — ED Notes (Signed)
Patient transported to X-ray 

## 2016-11-20 NOTE — ED Triage Notes (Signed)
Patient complains of asthma attack this am. EMS came to the home and she had a treatment. On arrival in no distress, speaking complete sentences. States that her chest still feels tight. Alert and oriented

## 2016-11-22 ENCOUNTER — Other Ambulatory Visit: Payer: Self-pay | Admitting: Critical Care Medicine

## 2016-11-22 MED ORDER — CETIRIZINE HCL 10 MG PO TABS: 10.0000 mg | ORAL_TABLET | Freq: Every day | ORAL | 11 refills | Status: DC

## 2016-11-22 MED ORDER — PREDNISONE 10 MG PO TABS: ORAL_TABLET | ORAL | 0 refills | Status: DC

## 2016-11-22 MED ORDER — BECLOMETHASONE DIPROPIONATE 80 MCG/ACT IN AERS: 2.0000 | INHALATION_SPRAY | Freq: Two times a day (BID) | RESPIRATORY_TRACT | 12 refills | Status: DC

## 2016-11-22 NOTE — Progress Notes (Signed)
I spoke to this patient by phone.  Asthma severe.  I had to cancel my office 11/23/16  Plan  Add Qvar two puff bid Stay symbicort two puff bid 160 Pulse prednisone Change to zyrtec daily Stay flonase 2 puff bid  Courtney Zamora

## 2016-11-23 ENCOUNTER — Ambulatory Visit: Payer: Self-pay | Admitting: Critical Care Medicine

## 2016-11-23 MED FILL — ?CETIRIZINE HCL 10 MG TABLE: 10 | 30 days supply | Qty: 30 | Fill #0

## 2016-11-23 MED FILL — QVAR REDIHALER 80 MCG/ACT A: 80 | 30 days supply | Qty: 11 | Fill #0

## 2016-11-23 MED FILL — ?PREDNISONE 10 MG TABLET: 10 | 16 days supply | Qty: 40 | Fill #0

## 2016-11-24 ENCOUNTER — Encounter: Payer: Self-pay | Admitting: Sports Medicine

## 2016-11-24 ENCOUNTER — Ambulatory Visit (INDEPENDENT_AMBULATORY_CARE_PROVIDER_SITE_OTHER): Payer: Self-pay | Admitting: Sports Medicine

## 2016-11-24 VITALS — BP 150/89 | Ht 64.0 in | Wt 365.0 lb

## 2016-11-24 DIAGNOSIS — M25571 Pain in right ankle and joints of right foot: Secondary | ICD-10-CM

## 2016-11-25 ENCOUNTER — Other Ambulatory Visit: Payer: Self-pay | Admitting: Family Medicine

## 2016-11-25 MED FILL — ?HYDROCHLOROTHIAZIDE 12.5MG: 12.5 | 30 days supply | Qty: 30 | Fill #3

## 2016-11-25 MED FILL — PANTOPRAZOLE SOD DR 40 MG T: 40 | 30 days supply | Qty: 30 | Fill #0

## 2016-11-25 MED FILL — DICLOFENAC SOD DR 75 MG TAB: 75 | 30 days supply | Qty: 60 | Fill #2

## 2016-11-25 MED FILL — tiZANidine HCL 4 MG TABS: 4 | 27 days supply | Qty: 110 | Fill #2
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
## 2016-11-28 ENCOUNTER — Emergency Department
Admission: EM | Admit: 2016-11-28 | Discharge: 2016-11-28 | Disposition: A | Discharge status: Home / Self Care | Payer: Self-pay | Attending: Emergency Medicine | Admitting: Emergency Medicine

## 2016-11-28 ENCOUNTER — Encounter: Payer: Self-pay | Admitting: Emergency Medicine

## 2016-11-28 ENCOUNTER — Emergency Department: Payer: Self-pay

## 2016-11-28 DIAGNOSIS — M79671 Pain in right foot: Secondary | ICD-10-CM | POA: Insufficient documentation

## 2016-11-28 DIAGNOSIS — J45909 Unspecified asthma, uncomplicated: Secondary | ICD-10-CM | POA: Insufficient documentation

## 2016-11-28 DIAGNOSIS — Z79899 Other long term (current) drug therapy: Secondary | ICD-10-CM | POA: Insufficient documentation

## 2016-11-28 NOTE — ED Notes (Signed)
Pt states breaking toe April 28th on right foot, at follow-up appt was given boot on friday, made pain worse and unable to bear weight d/t pain from arch to heel. States didn't make contact with ortho PTA d/t pain. Pt states narcotic pain and OTC pain meds not touching pain, swelling increasing x2 days. Throbbing pain.
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
## 2016-11-28 NOTE — ED Provider Notes (Signed)
[facility] Emergency Department Provider Note  ____________________________________________  Time seen: Approximately 9:31 PM  I have reviewed the triage vital signs and the nursing notes.   HISTORY  Chief Complaint Ankle Pain (right) and Foot Pain    HPI Courtney Zamora is a 31 y.o. female that presents to the emergency department with right foot pain for 3 days. Patient states that she can no longer walk on her right foot it is so painful. She states it hurts all the way from her heel to her toes and across the top of her foot. Nothing makes the pain better. Foot hurts even with sitting still. It has been swelling for 3 days. She states that it feels like her bones are rubbing up against each other. She fractured her second toe in April. No new trauma. Everything over-the-counter that she has in her cupboards. She has also tried Tylenol for. Nothing is touching the pain. She has iced and elevated her foot. She also has a boot. She is concerned that she has a stress fracture.She denies fever, shortness breath, chest pain, nausea, vomiting, abdominal pain, back pain.   Past Medical History:  Diagnosis Date  . Anxiety   . Asthma   . Depression   . Obesity   . Obesity   . Seizures [facility])     Patient Active Problem List   Diagnosis Date Noted  . Metatarsal stress fracture, right, sequela 11/17/2016  . Post concussion syndrome 10/25/2016  . Syncope 10/05/2016  . Neck muscle spasm 10/05/2016  . Nipple discharge in female 09/13/2016  . Depression 08/17/2016  . Anxiety 08/17/2016  . ASCUS with positive high risk HPV cervical 08/14/2016  . Left foot pain 08/14/2016  . Hypertension 08/14/2016  . Long term (current) use of systemic steroids 08/14/2016  . Weight gain 01/12/2016  . Esophageal reflux 12/29/2015  . Morbid obesity (HCC) 12/29/2015  . Chronic pain of right knee 12/29/2015  . Chronic pain of left ankle 12/29/2015  . 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 Asthma, moderate persistent 12/09/2015    Past Surgical History:  Procedure Laterality Date  . TONSILLECTOMY      Prior to Admission medications   Medication Sig Start Date End Date Taking? Authorizing Provider  acetaminophen-codeine (TYLENOL #3) 300-30 MG tablet Take 1 tablet by mouth every 6 (six) hours as needed. for pain Patient taking differently: Take 1 tablet by mouth every 8 (eight) hours. for pain 10/25/16   Dessa Phi, MD  acetaminophen-codeine (TYLENOL #4) 300-60 MG tablet Take 1 tablet by mouth every 6 (six) hours as needed for moderate pain. 11/17/16   Funches, Gerilyn Nestle, MD  albuterol (PROVENTIL HFA;VENTOLIN HFA) 108 (90 Base) MCG/ACT inhaler Inhale 2 puffs into the lungs every 6 (six) hours as needed for wheezing or shortness of breath (wheezing). For shortness of breath. 03/18/16   Funches, Gerilyn Nestle, MD  albuterol (PROVENTIL) (2.5 MG/3ML) 0.083% nebulizer solution Take 3 mLs (2.5 mg total) by nebulization every 6 (six) hours as needed for wheezing or shortness of breath. 11/17/16   Funches, Gerilyn Nestle, MD  beclomethasone (QVAR) 80 MCG/ACT inhaler Inhale 2 puffs into the lungs 2 (two) times daily. 11/22/16 11/22/17  Storm Frisk, MD  budesonide-formoterol (SYMBICORT) 160-4.5 MCG/ACT inhaler Inhale 2 puffs into the lungs 2 (two) times daily. 03/18/16   Funches, Gerilyn Nestle, MD  buPROPion (WELLBUTRIN XL) 300 MG 24 hr tablet Take 1 tablet (300 mg total) by mouth daily. 11/17/16   Dessa Phi, MD  Calcium Citrate 200  MG TABS Take 2 tablets (400 mg total) by mouth every other day. 10/05/16   Newt Lukes, MD  cetirizine (ZYRTEC) 10 MG tablet Take 1 tablet (10 mg total) by mouth daily. 11/22/16   Storm Frisk, MD  Cholecalciferol (VITAMIN D3) 5000 units CAPS Take 5,000 Units by mouth once a week.    [provider]  fluticasone (FLONASE) 50 MCG/ACT nasal spray Place 2 sprays into both nostrils 2 (two) times daily. 07/06/16   Storm Frisk, MD  gabapentin  (NEURONTIN) 300 MG capsule Take 1 capsule (300 mg total) by mouth 3 (three) times daily. 10/05/16   Newt Lukes, MD  hydrochlorothiazide (HYDRODIURIL) 12.5 MG tablet Take 1 tablet (12.5 mg total) by mouth daily. 09/13/16   Funches, Gerilyn Nestle, MD  methocarbamol (ROBAXIN-750) 750 MG tablet Take 1 tablet (750 mg total) by mouth every 8 (eight) hours as needed for muscle spasms. Patient not taking: Reported on 11/19/2016 10/05/16   Newt Lukes, MD  Misc. Devices (CRUTCHES-ALUMINUM) MISC 1 each by Does not apply route daily. 11/17/16   Funches, Gerilyn Nestle, MD  naproxen (NAPROSYN) 500 MG tablet Take 1 tablet (500 mg total) by mouth 2 (two) times daily with a meal. Patient not taking: Reported on 11/19/2016 11/15/16   Joni Reining, PA-C  pantoprazole (PROTONIX) 40 MG tablet TAKE 1 TABLET BY MOUTH DAILY 11/25/16   Dessa Phi, MD  predniSONE (DELTASONE) 10 MG tablet Take 4 for four days 3 for four days 2 for four days 1 for four days 11/22/16   Storm Frisk, MD  promethazine (PHENERGAN) 25 MG tablet Take 1 tablet (25 mg total) by mouth every 8 (eight) hours as needed for nausea or vomiting. 11/17/16   Funches, Gerilyn Nestle, MD  tiZANidine (ZANAFLEX) 4 MG tablet TAKE 1 TABLET BY MOUTH EVERY 6 HOURS AS NEEDED FOR MUSCLE SPASMS. Patient taking differently: Take 4 mg by mouth every 6 (six) hours as needed for muscle spasms.  08/22/16   Dessa Phi, MD    Allergies Bee venom; Cinnamon; Ibuprofen; and Tape  Family History  Problem Relation Age of Onset  . Hypothyroidism Mother   . Diabetes Father   . Diabetes Paternal Uncle   . Hypertension Maternal Grandmother   . Hypertension Maternal Grandfather   . Pancreatic cancer Maternal Grandfather   . Hypertension Paternal Grandmother   . Heart disease Paternal Grandfather   . Hypertension Paternal Grandfather     Social History Social History  Substance Use Topics  . Smoking status: Never Smoker  . Smokeless tobacco: Never Used  . Alcohol use No      Review of Systems  Constitutional: No fever/chills Cardiovascular: No chest pain. Respiratory: No SOB. Gastrointestinal: No abdominal pain.  No nausea, no vomiting.  Musculoskeletal: Positive for right pain. Skin: Negative for rash, abrasions, lacerations, ecchymosis. Neurological: Negative for headaches, numbness or tingling   ____________________________________________   PHYSICAL EXAM:  VITAL SIGNS: ED Triage Vitals  Enc Vitals Group     BP 11/28/16 1826 (!) 146/82     Pulse Rate 11/28/16 1826 97     Resp 11/28/16 1826 19     Temp 11/28/16 1826 98.2 F (36.8 C)     Temp Source 11/28/16 1826 Oral     SpO2 11/28/16 1826 100 %     Weight 11/28/16 1827 (!) 365 lb (165.6 kg)     Height 11/28/16 1827 5\' 4"  (1.626 m)     Head Circumference --  Peak Flow --      Pain Score 11/28/16 1825 5     Pain Loc --      Pain Edu? --      Excl. in GC? --      Eyes: Conjunctivae are normal. PERRL. EOMI. Head: Atraumatic. ENT:      Ears:      Nose: No congestion/rhinnorhea.      Mouth/Throat: Mucous membranes are moist.  Neck: No stridor.  Cardiovascular: Normal rate, regular rhythm.  Good peripheral circulation. Respiratory: Normal respiratory effort without tachypnea or retractions. Lungs CTAB. Good air entry to the bases with no decreased or absent breath sounds. Musculoskeletal: Full range of motion to all extremities. No gross deformities appreciated. Tenderness to palpation diffusely with minimal touch of right foot. No swelling. She is able to move her toes normally. Neurologic:  Normal speech and language. No gross focal neurologic deficits are appreciated.  Skin:  Skin is warm, dry and intact. No rash noted.   ____________________________________________   LABS (all labs ordered are listed, but only abnormal results are displayed)  Labs Reviewed - No data to  display ____________________________________________  EKG   ____________________________________________  RADIOLOGY Lexine Baton, personally viewed and evaluated these images (plain radiographs) as part of my medical decision making, as well as reviewing the written report by the radiologist.  Dg Foot Complete Right  Result Date: 11/28/2016 CLINICAL DATA:  Follow-up broken toe, unable to bear weight. EXAM: RIGHT FOOT COMPLETE - 3+ VIEW COMPARISON:  RIGHT foot radiograph Nov 15, 2016 FINDINGS: Cortical regularity fifth proximal phalanx suggests old fracture. No cortical irregularity of the second metatarsus on today's examination. No acute fracture deformity. No dislocation. Linear lucency through anterior process of the calcaneus is likely projectional. No destructive bony lesions. Large body habitus. IMPRESSION: No acute fracture deformity or dislocation. Electronically Signed   By: Awilda Metro M.D.   On: 11/28/2016 21:20    ____________________________________________    PROCEDURES  Procedure(s) performed:    Procedures    Medications - No data to display   ____________________________________________   INITIAL IMPRESSION / ASSESSMENT AND PLAN / ED COURSE  Pertinent labs & imaging results that were available during my care of the patient were reviewed by me and considered in my medical decision making (see chart for details).  Review of the [place] CSRS was performed in accordance of the [facility] prior to dispensing any controlled drugs.     Patient's diagnosis is consistent with musculoskeletal pain. Vital signs and exam are reassuring.  X-ray negative for acute bony abnormalities. No trauma. No swelling, erythema, or sensation changes. Patient is to follow up with podiatry as directed. Patient is given ED precautions to return to the ED for any worsening or new symptoms.     ____________________________________________  FINAL CLINICAL IMPRESSION(S) / ED  DIAGNOSES  Final diagnoses:  Right foot pain      NEW MEDICATIONS STARTED DURING THIS VISIT:  Discharge Medication List as of 11/28/2016  9:48 PM          This chart was dictated using voice recognition software/Dragon. Despite best efforts to proofread, errors can occur which can change the meaning. Any change was purely unintentional.    Enid Derry, PA-C 11/29/16 0018    Jeanmarie Plant, MD 11/30/16 [phone]

## 2016-11-30 ENCOUNTER — Encounter: Payer: Self-pay | Admitting: Family Medicine

## 2016-11-30 ENCOUNTER — Other Ambulatory Visit: Payer: Self-pay | Admitting: Internal Medicine

## 2016-11-30 DIAGNOSIS — M25532 Pain in left wrist: Secondary | ICD-10-CM

## 2016-11-30 MED FILL — GABAPENTIN 300 MG CAPSULE: 300 | 30 days supply | Qty: 90 | Fill #1

## 2016-12-02 ENCOUNTER — Encounter (HOSPITAL_BASED_OUTPATIENT_CLINIC_OR_DEPARTMENT_OTHER): Payer: Self-pay | Admitting: Emergency Medicine

## 2016-12-02 ENCOUNTER — Emergency Department (HOSPITAL_BASED_OUTPATIENT_CLINIC_OR_DEPARTMENT_OTHER)
Admission: EM | Admit: 2016-12-02 | Discharge: 2016-12-02 | Disposition: A | Discharge status: Home / Self Care | Payer: Self-pay | Attending: Emergency Medicine | Admitting: Emergency Medicine

## 2016-12-02 ENCOUNTER — Emergency Department (HOSPITAL_BASED_OUTPATIENT_CLINIC_OR_DEPARTMENT_OTHER): Payer: Self-pay

## 2016-12-02 DIAGNOSIS — Z7952 Long term (current) use of systemic steroids: Secondary | ICD-10-CM | POA: Insufficient documentation

## 2016-12-02 DIAGNOSIS — Z79899 Other long term (current) drug therapy: Secondary | ICD-10-CM | POA: Insufficient documentation

## 2016-12-02 DIAGNOSIS — I10 Essential (primary) hypertension: Secondary | ICD-10-CM | POA: Insufficient documentation

## 2016-12-02 DIAGNOSIS — J4541 Moderate persistent asthma with (acute) exacerbation: Secondary | ICD-10-CM | POA: Insufficient documentation

## 2016-12-02 DIAGNOSIS — G8929 Other chronic pain: Secondary | ICD-10-CM | POA: Insufficient documentation

## 2016-12-02 MED ORDER — IPRATROPIUM-ALBUTEROL 0.5-2.5 (3) MG/3ML IN SOLN
3.0000 mL | Freq: Four times a day (QID) | RESPIRATORY_TRACT | Status: DC
  Administered 2016-12-02: 3 mL via RESPIRATORY_TRACT
  Filled 2016-12-02: qty 3

## 2016-12-02 MED ORDER — PREDNISONE 10 MG PO TABS: 40.0000 mg | ORAL_TABLET | Freq: Every day | ORAL | 0 refills | Status: DC

## 2016-12-02 MED ORDER — ONDANSETRON 4 MG PO TBDP
4.0000 mg | ORAL_TABLET | Freq: Once | ORAL | Status: AC
  Administered 2016-12-02: 4 mg via ORAL
  Filled 2016-12-02: qty 1

## 2016-12-02 MED ORDER — PREDNISONE 50 MG PO TABS
60.0000 mg | ORAL_TABLET | Freq: Once | ORAL | Status: AC
  Administered 2016-12-02: 13:00:00 60 mg via ORAL
  Filled 2016-12-02: qty 1

## 2016-12-02 MED FILL — predniSONE 10 MG TABS: 10 | 5 days supply | Qty: 20 | Fill #0

## 2016-12-02 NOTE — Discharge Instructions (Signed)
Use albuterol inhaler or nebulizer every 6 hours for the next 7 days. Then as needed. Take prednisone as directed for the next 5 days. Take the Phenergan you have at home for nausea. Return for any new or worse symptoms.

## 2016-12-02 NOTE — ED Notes (Signed)
Dr Herma Carson in room with pt now.

## 2016-12-02 NOTE — ED Triage Notes (Signed)
Upon arrival, pt holding left rib cage and hyperventilating.  Pt having pain to left rib area, states started this am.  Teeth chattering, pt relates response to pain.  Pt states she is sob, but is moving air, skin pink, pulse ox 100%.  Lungs clear.

## 2016-12-02 NOTE — ED Notes (Signed)
Pt REFUSES labs or IV start

## 2016-12-02 NOTE — ED Notes (Signed)
Patient reports nausea.  MD notified.

## 2016-12-02 NOTE — ED Notes (Signed)
Pt in wheelchair. 

## 2016-12-02 NOTE — ED Provider Notes (Signed)
[facility] Provider Note   CSN: [id number] Arrival date & time: 12/02/16  0905     History   Chief Complaint Chief Complaint  Patient presents with  . Chest Pain    HPI Courtney Zamora is a 31 y.o. female.   The patient with a history of asthma. Patient currently not on steroids. But has nebulizer and inhalers for treatment. Upon arrival today patient was holding left rib cage area hyperventilating. Patient states having pain to the left side of the chest. Started this morning. Teeth were chattering patient related this response was to pain. Not that she felt that should fever or chills. Patient also states she was short of breath and feels as if her asthma is  Acting up. Room air sats upon arrival were 100%. Triaged lungs were clear. But when brought back with moving she had some wheezing.  Denies any history of fall or injury to the left chest.      Past Medical History:  Diagnosis Date  . Anxiety   . Asthma   . Depression   . Obesity   . Obesity   . Seizures [place])     Patient Active Problem List   Diagnosis Date Noted  . Metatarsal stress fracture, right, sequela 11/17/2016  . Post concussion syndrome 10/25/2016  . Syncope 10/05/2016  . Neck muscle spasm 10/05/2016  . Nipple discharge in female 09/13/2016  . Depression 08/17/2016  . Anxiety 08/17/2016  . ASCUS with positive high risk HPV cervical 08/14/2016  . Left foot pain 08/14/2016  . Hypertension 08/14/2016  . Long term (current) use of systemic steroids 08/14/2016  . Weight gain 01/12/2016  . Esophageal reflux 12/29/2015  . Morbid obesity (HCC) 12/29/2015  . Chronic pain of right knee 12/29/2015  . Chronic pain of left ankle 12/29/2015  . 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 Asthma, moderate persistent 12/09/2015    Past Surgical History:  Procedure Laterality Date  . TONSILLECTOMY      OB History    Gravida Para Term Preterm AB Living   1       1 0   SAB TAB Ectopic Multiple Live  Births   1               Home Medications    Prior to Admission medications   Medication Sig Start Date End Date Taking? Authorizing Provider  acetaminophen-codeine (TYLENOL #3) 300-30 MG tablet Take 1 tablet by mouth every 6 (six) hours as needed. for pain Patient taking differently: Take 1 tablet by mouth every 8 (eight) hours. for pain 10/25/16   Dessa Phi, MD  acetaminophen-codeine (TYLENOL #4) 300-60 MG tablet Take 1 tablet by mouth every 6 (six) hours as needed for moderate pain. 11/17/16   Funches, Gerilyn Nestle, MD  albuterol (PROVENTIL HFA;VENTOLIN HFA) 108 (90 Base) MCG/ACT inhaler Inhale 2 puffs into the lungs every 6 (six) hours as needed for wheezing or shortness of breath (wheezing). For shortness of breath. 03/18/16   Funches, Gerilyn Nestle, MD  albuterol (PROVENTIL) (2.5 MG/3ML) 0.083% nebulizer solution Take 3 mLs (2.5 mg total) by nebulization every 6 (six) hours as needed for wheezing or shortness of breath. 11/17/16   Funches, Gerilyn Nestle, MD  beclomethasone (QVAR) 80 MCG/ACT inhaler Inhale 2 puffs into the lungs 2 (two) times daily. 11/22/16 11/22/17  Storm Frisk, MD  budesonide-formoterol (SYMBICORT) 160-4.5 MCG/ACT inhaler Inhale 2 puffs into the lungs 2 (two) times daily. 03/18/16   Dessa Phi, MD  buPROPion (WELLBUTRIN XL) 300  MG 24 hr tablet Take 1 tablet (300 mg total) by mouth daily. 11/17/16   Funches, Gerilyn Nestle, MD  Calcium Citrate 200 MG TABS Take 2 tablets (400 mg total) by mouth every other day. 10/05/16   Newt Lukes, MD  cetirizine (ZYRTEC) 10 MG tablet Take 1 tablet (10 mg total) by mouth daily. 11/22/16   Storm Frisk, MD  Cholecalciferol (VITAMIN D3) 5000 units CAPS Take 5,000 Units by mouth once a week.    [provider]  fluticasone (FLONASE) 50 MCG/ACT nasal spray Place 2 sprays into both nostrils 2 (two) times daily. 07/06/16   Storm Frisk, MD  gabapentin (NEURONTIN) 300 MG capsule Take 1 capsule (300 mg total) by mouth 3 (three) times  daily. 10/05/16   Newt Lukes, MD  hydrochlorothiazide (HYDRODIURIL) 12.5 MG tablet Take 1 tablet (12.5 mg total) by mouth daily. 09/13/16   Funches, Gerilyn Nestle, MD  methocarbamol (ROBAXIN-750) 750 MG tablet Take 1 tablet (750 mg total) by mouth every 8 (eight) hours as needed for muscle spasms. Patient not taking: Reported on 11/19/2016 10/05/16   Newt Lukes, MD  Misc. Devices (CRUTCHES-ALUMINUM) MISC 1 each by Does not apply route daily. 11/17/16   Funches, Gerilyn Nestle, MD  naproxen (NAPROSYN) 500 MG tablet Take 1 tablet (500 mg total) by mouth 2 (two) times daily with a meal. Patient not taking: Reported on 11/19/2016 11/15/16   Joni Reining, PA-C  pantoprazole (PROTONIX) 40 MG tablet TAKE 1 TABLET BY MOUTH DAILY 11/25/16   Dessa Phi, MD  predniSONE (DELTASONE) 10 MG tablet Take 4 for four days 3 for four days 2 for four days 1 for four days 11/22/16   Storm Frisk, MD  predniSONE (DELTASONE) 10 MG tablet Take 4 tablets (40 mg total) by mouth daily. 12/02/16   Vanetta Mulders, MD  promethazine (PHENERGAN) 25 MG tablet Take 1 tablet (25 mg total) by mouth every 8 (eight) hours as needed for nausea or vomiting. 11/17/16   Funches, Gerilyn Nestle, MD  tiZANidine (ZANAFLEX) 4 MG tablet TAKE 1 TABLET BY MOUTH EVERY 6 HOURS AS NEEDED FOR MUSCLE SPASMS. Patient taking differently: Take 4 mg by mouth every 6 (six) hours as needed for muscle spasms.  08/22/16   Dessa Phi, MD    Family History Family History  Problem Relation Age of Onset  . Hypothyroidism Mother   . Diabetes Father   . Diabetes Paternal Uncle   . Hypertension Maternal Grandmother   . Hypertension Maternal Grandfather   . Pancreatic cancer Maternal Grandfather   . Hypertension Paternal Grandmother   . Heart disease Paternal Grandfather   . Hypertension Paternal Grandfather     Social History Social History  Substance Use Topics  . Smoking status: Never Smoker  . Smokeless tobacco: Never Used  . Alcohol use No      Allergies   Bee venom; Cinnamon; Ibuprofen; and Tape   Review of Systems Review of Systems  Constitutional: Negative for fever.  HENT: Positive for congestion.   Eyes: Negative for redness.  Respiratory: Positive for shortness of breath and wheezing.   Cardiovascular: Positive for chest pain.  Gastrointestinal: Positive for nausea. Negative for abdominal pain.  Genitourinary: Negative for dysuria.  Musculoskeletal: Negative for back pain.  Skin: Negative for rash.  Neurological: Positive for headaches.  Hematological: Does not bruise/bleed easily.  Psychiatric/Behavioral: Negative for confusion.     Physical Exam Updated Vital Signs BP 121/72   Pulse (!) 49   Temp 98.1 F (36.7  C) (Oral)   Resp 16   Ht 5\' 4"  (1.626 m)   Wt (!) 366 lb (166 kg)   LMP 11/09/2016 (Approximate) Comment: NEG preg test on 11/17/16  SpO2 100%   BMI 62.82 kg/m   Physical Exam  Constitutional: She is oriented to person, place, and time. She appears well-developed and well-nourished. No distress.  HENT:  Head: Normocephalic and atraumatic.  Mouth/Throat: Oropharynx is clear and moist.  Eyes: EOM are normal. Pupils are equal, round, and reactive to light.  Neck: Normal range of motion. Neck supple.  Cardiovascular: Normal rate and regular rhythm.   Pulmonary/Chest: Effort normal. She has wheezes.  Abdominal: Soft. Bowel sounds are normal. There is no tenderness.  Musculoskeletal: Normal range of motion.  Neurological: She is alert and oriented to person, place, and time. No cranial nerve deficit or sensory deficit. She exhibits normal muscle tone. Coordination normal.  Skin: Skin is warm.  Nursing note and vitals reviewed.    ED Treatments / Results  Labs (all labs ordered are listed, but only abnormal results are displayed) Labs Reviewed - No data to display  EKG  EKG Interpretation  Date/Time:  Friday Dec 02 2016 09:26:40 EDT Ventricular Rate:  97 PR Interval:    QRS  Duration: 86 QT Interval:  349 QTC Calculation: 444 R Axis:   47 Text Interpretation:  Normal sinus rhythm Minimal ST depression, inferior leads Baseline wander in lead(s) I III aVL aVF V3 Interpretation limited secondary to artifact Confirmed by Vanetta Mulders [phone]) on 12/02/2016 9:29:52 AM       Radiology Dg Chest 2 View  Result Date: 12/02/2016 CLINICAL DATA:  Shortness of breath and left-sided chest pain EXAM: CHEST  2 VIEW COMPARISON:  Nov 20, 2016 FINDINGS: Lungs are clear. Heart size and pulmonary vascularity are normal. No adenopathy. No pneumothorax. No bone lesions. IMPRESSION: No edema or consolidation. Electronically Signed   By: Bretta Bang III M.D.   On: 12/02/2016 11:22    Procedures Procedures (including critical care time)  Medications Ordered in ED Medications  ipratropium-albuterol (DUONEB) 0.5-2.5 (3) MG/3ML nebulizer solution 3 mL (3 mLs Nebulization Given 12/02/16 1051)  predniSONE (DELTASONE) tablet 60 mg (not administered)  ondansetron (ZOFRAN-ODT) disintegrating tablet 4 mg (4 mg Oral Given 12/02/16 1222)     Initial Impression / Assessment and Plan / ED Course  I have reviewed the triage vital signs and the nursing notes.  Pertinent labs & imaging results that were available during my care of the patient were reviewed by me and considered in my medical decision making (see chart for details).      Patient with a history of asthma. Patient with increased shortness of breath and some chest discomfort. EKG without acute findings. Patient's breathing improved here with nebulizer treatment. Patient also given some Zofran for some nausea. Patient has Phenergan at home for this. States the Zofran didn't help much. Patient also with the headache that she calls a migraine type headache.   The left-sided chest pain that she had upon arrival a settle down significantly. No chest pain at discharge.  Chest x-ray negative for pneumonia or any acute findings.  Patient's breathing improved with nebulizer treatment here.  Patient will be discharged home with continuing her albuterol and nebulizer treatments at home she'll be put on a 5 day course of prednisone. First dose given here today. And patient will take her Phenergan for her headache and her nausea. Patient nontoxic no acute distress.  Final Clinical Impressions(s) /  ED Diagnoses   Final diagnoses:  Moderate persistent asthma with exacerbation    New Prescriptions New Prescriptions   PREDNISONE (DELTASONE) 10 MG TABLET    Take 4 tablets (40 mg total) by mouth daily.     Vanetta Mulders, MD 12/02/16 1310

## 2016-12-05 ENCOUNTER — Encounter: Payer: Self-pay | Admitting: Family Medicine

## 2016-12-05 NOTE — Telephone Encounter (Signed)
Patient advice needed

## 2016-12-05 NOTE — Telephone Encounter (Signed)
Patient response

## 2016-12-07 ENCOUNTER — Ambulatory Visit: Payer: Self-pay | Attending: Family Medicine | Admitting: Physician Assistant

## 2016-12-07 ENCOUNTER — Encounter: Payer: Self-pay | Admitting: Physician Assistant

## 2016-12-07 VITALS — BP 144/81 | HR 93 | Temp 98.7°F | Resp 16 | Wt 368.0 lb

## 2016-12-07 DIAGNOSIS — J4540 Moderate persistent asthma, uncomplicated: Secondary | ICD-10-CM | POA: Insufficient documentation

## 2016-12-07 DIAGNOSIS — F329 Major depressive disorder, single episode, unspecified: Secondary | ICD-10-CM | POA: Insufficient documentation

## 2016-12-07 DIAGNOSIS — I10 Essential (primary) hypertension: Secondary | ICD-10-CM | POA: Insufficient documentation

## 2016-12-07 DIAGNOSIS — J40 Bronchitis, not specified as acute or chronic: Secondary | ICD-10-CM | POA: Insufficient documentation

## 2016-12-07 DIAGNOSIS — F419 Anxiety disorder, unspecified: Secondary | ICD-10-CM | POA: Insufficient documentation

## 2016-12-07 MED ORDER — FLUCONAZOLE 150 MG PO TABS: 150.0000 mg | ORAL_TABLET | Freq: Once | ORAL | 0 refills | Status: AC

## 2016-12-07 MED ORDER — AZITHROMYCIN 250 MG PO TABS: ORAL_TABLET | ORAL | 0 refills | Status: DC

## 2016-12-07 MED FILL — FLUCONAZOLE 150 MG TABLET: 150 | 1 days supply | Qty: 1 | Fill #0

## 2016-12-07 MED FILL — AZITHROMYCIN 250 MG TABLET: 250 | 5 days supply | Qty: 6 | Fill #0

## 2016-12-07 NOTE — Progress Notes (Signed)
Courtney Zamora, is a 31 y.o. female  GLO:[id number]  JOA:[id number]  DOB - 1986-03-22  Subjective:  Chief Complaint and HPI: Courtney Zamora is a 31 y.o. female here today for a follow up visit for 5-6 day h/o cough and congestion with thick yellow and green mucus.  Seen in the Ed for asthma attack on 12/02/2016 and was giving a breathing treatment and discharged on a 6 day prednisone taper. She denies f/c.  Using inhaler/nebs every 4 to 6 hours.  Wheezing seems to be improving.  No CP or palpitations.  ED/Hospital notes reviewed.    ROS:   Constitutional:  No f/c, No night sweats, No unexplained weight loss. EENT:  No vision changes, No blurry vision, No hearing changes. No mouth, throat, or ear problems.  Respiratory: + cough, +SOB Cardiac: No CP, no palpitations GI:  No abd pain, No N/V/D. GU: No Urinary s/sx Musculoskeletal: No joint pain Neuro: No headache, no dizziness, no motor weakness.  Skin: No rash Endocrine:  No polydipsia. No polyuria.  Psych: Denies SI/HI  No problems updated.  ALLERGIES: Allergies  Allergen Reactions  . Bee Venom Anaphylaxis  . Cinnamon Anaphylaxis  . Ibuprofen Nausea Only  . Tape Hives and Rash    EKG STICKERS give pt rash, hives    PAST MEDICAL HISTORY: Past Medical History:  Diagnosis Date  . Anxiety   . Asthma   . Depression   . Obesity   . Obesity   . Seizures (HCC)     MEDICATIONS AT HOME: Prior to Admission medications   Medication Sig Start Date End Date Taking? Authorizing Provider  acetaminophen-codeine (TYLENOL #3) 300-30 MG tablet Take 1 tablet by mouth every 6 (six) hours as needed. for pain 10/25/16  Yes Funches, Josalyn, MD  acetaminophen-codeine (TYLENOL #4) 300-60 MG tablet Take 1 tablet by mouth every 6 (six) hours as needed for moderate pain. 11/17/16  Yes Funches, Josalyn, MD  albuterol (PROVENTIL HFA;VENTOLIN HFA) 108 (90 Base) MCG/ACT inhaler Inhale 2 puffs into the lungs every 6 (six) hours as needed for wheezing  or shortness of breath (wheezing). For shortness of breath. 03/18/16  Yes Funches, Josalyn, MD  albuterol (PROVENTIL) (2.5 MG/3ML) 0.083% nebulizer solution Take 3 mLs (2.5 mg total) by nebulization every 6 (six) hours as needed for wheezing or shortness of breath. 11/17/16  Yes Funches, Josalyn, MD  beclomethasone (QVAR) 80 MCG/ACT inhaler Inhale 2 puffs into the lungs 2 (two) times daily. 11/22/16 11/22/17 Yes Storm Frisk, MD  budesonide-formoterol [facility]) 160-4.5 MCG/ACT inhaler Inhale 2 puffs into the lungs 2 (two) times daily. 03/18/16  Yes Funches, Josalyn, MD  buPROPion (WELLBUTRIN XL) 300 MG 24 hr tablet Take 1 tablet (300 mg total) by mouth daily. 11/17/16  Yes Funches, Josalyn, MD  Calcium Citrate 200 MG TABS Take 2 tablets (400 mg total) by mouth every other day. 10/05/16  Yes Newt Lukes, MD  cetirizine (ZYRTEC) 10 MG tablet Take 1 tablet (10 mg total) by mouth daily. 11/22/16  Yes Storm Frisk, MD  Cholecalciferol (VITAMIN D3) 5000 units CAPS Take 5,000 Units by mouth once a week.   Yes [provider]  fluticasone (FLONASE) 50 MCG/ACT nasal spray Place 2 sprays into both nostrils 2 (two) times daily. 07/06/16  Yes Storm Frisk, MD  gabapentin (NEURONTIN) 300 MG capsule Take 1 capsule (300 mg total) by mouth 3 (three) times daily. 10/05/16  Yes Newt Lukes, MD  guaiFENesin-dextromethorphan (ROBITUSSIN DM) 100-10 MG/5ML syrup Take 5 mLs  by mouth 2 (two) times daily.   Yes [provider]  hydrochlorothiazide (HYDRODIURIL) 12.5 MG tablet Take 1 tablet (12.5 mg total) by mouth daily. 09/13/16  Yes Dessa Phi, MD  Misc. Devices (CRUTCHES-ALUMINUM) MISC 1 each by Does not apply route daily. 11/17/16  Yes Funches, Josalyn, MD  pantoprazole (PROTONIX) 40 MG tablet TAKE 1 TABLET BY MOUTH DAILY 11/25/16  Yes Funches, Josalyn, MD  Prenatal Vit-Fe Fumarate-FA (PRENATAL MULTIVITAMIN) TABS tablet Take 1 tablet by mouth daily at 12 noon.   Yes [provider]  promethazine (PHENERGAN) 25 MG tablet Take 1 tablet (25 mg total) by mouth every 8 (eight) hours as needed for nausea or vomiting. 11/17/16  Yes Funches, Josalyn, MD  tiZANidine (ZANAFLEX) 4 MG tablet TAKE 1 TABLET BY MOUTH EVERY 6 HOURS AS NEEDED FOR MUSCLE SPASMS. Patient taking differently: Take 4 mg by mouth every 6 (six) hours as needed for muscle spasms.  08/22/16  Yes Funches, Gerilyn Nestle, MD  azithromycin (ZITHROMAX) 250 MG tablet Take 2 today then 1 days 2-5 12/07/16   Anders Simmonds, PA-C  fluconazole (DIFLUCAN) 150 MG tablet Take 1 tablet (150 mg total) by mouth once. 12/07/16 12/07/16  Anders Simmonds, PA-C  methocarbamol (ROBAXIN-750) 750 MG tablet Take 1 tablet (750 mg total) by mouth every 8 (eight) hours as needed for muscle spasms. Patient not taking: Reported on 11/19/2016 10/05/16   Newt Lukes, MD  naproxen (NAPROSYN) 500 MG tablet Take 1 tablet (500 mg total) by mouth 2 (two) times daily with a meal. Patient not taking: Reported on 11/19/2016 11/15/16   Joni Reining, PA-C  predniSONE (DELTASONE) 10 MG tablet Take 4 for four days 3 for four days 2 for four days 1 for four days Patient not taking: Reported on 12/07/2016 11/22/16   Storm Frisk, MD  predniSONE (DELTASONE) 10 MG tablet Take 4 tablets (40 mg total) by mouth daily. Patient not taking: Reported on 12/07/2016 12/02/16   Vanetta Mulders, MD     Objective:  EXAM:   Vitals:   12/07/16 1405  BP: (!) 144/81  Pulse: 93  Resp: 16  Temp: 98.7 F (37.1 C)  TempSrc: Oral  SpO2: 98%  Weight: (!) 368 lb (166.9 kg)    General appearance : A&OX3. NAD. Non-toxic-appearing, morbidly obese.  98% sats on RA HEENT: Atraumatic and Normocephalic.  PERRLA. EOM intact.  TM clear B. Mouth-MMM, post pharynx WNL w/o erythema, No PND. Neck: supple, no JVD. No cervical lymphadenopathy. No thyromegaly Chest/Lungs:  Breathing-non-labored, Good air entry bilaterally, + mild wheezing with forced expiration but clear  with deep breathing.  No rales, no rhonchi. CVS: S1 S2 regular, no murmurs, gallops, rubs  Extremities: Bilateral Lower Ext shows no edema, both legs are warm to touch with = pulse throughout Neurology:  CN II-XII grossly intact, Non focal.   Psych:  TP linear. J/I WNL. Normal speech. Appropriate eye contact and affect.  Skin:  No Rash  Data Review No results found for: HGBA1C   Assessment & Plan   1. Bronchitis URI care, fluids, rest - azithromycin (ZITHROMAX) 250 MG tablet; Take 2 today then 1 days 2-5  Dispense: 6 tablet; Refill: 0 - fluconazole (DIFLUCAN) 150 MG tablet; Take 1 tablet (150 mg total) by mouth once.  Dispense: 1 tablet; Refill: 0  2. Moderate persistent asthma without complication Continue current regimen, schedule appt with Dr Delford Field.  No warning signs today  3. Essential hypertension Suboptimal control.  Continue current regimen. Check  BP OOO and record and bring to next visit.  Patient have been counseled extensively about nutrition and exercise  Return in about 3 weeks (around 12/30/2016) for keep f/up appt with Dr Armen Pickup .  The patient was given clear instructions to go to ER or return to medical center if symptoms don't improve, worsen or new problems develop. The patient verbalized understanding. The patient was told to call to get lab results if they haven't heard anything in the next week.     Georgian Co, PA-C [facility] [place], [place] [phone]   12/07/2016, 2:35 PMPatient ID: Courtney Zamora, female   DOB: January 16, 1986, 31 y.o.   MRN: [id number]

## 2016-12-09 ENCOUNTER — Emergency Department (HOSPITAL_BASED_OUTPATIENT_CLINIC_OR_DEPARTMENT_OTHER)
Admission: EM | Admit: 2016-12-09 | Discharge: 2016-12-09 | Disposition: A | Discharge status: Home / Self Care | Payer: Self-pay | Attending: Emergency Medicine | Admitting: Emergency Medicine

## 2016-12-09 ENCOUNTER — Emergency Department (HOSPITAL_BASED_OUTPATIENT_CLINIC_OR_DEPARTMENT_OTHER): Payer: Self-pay

## 2016-12-09 ENCOUNTER — Encounter (HOSPITAL_BASED_OUTPATIENT_CLINIC_OR_DEPARTMENT_OTHER): Payer: Self-pay | Admitting: Emergency Medicine

## 2016-12-09 DIAGNOSIS — I10 Essential (primary) hypertension: Secondary | ICD-10-CM | POA: Insufficient documentation

## 2016-12-09 DIAGNOSIS — J4521 Mild intermittent asthma with (acute) exacerbation: Secondary | ICD-10-CM | POA: Insufficient documentation

## 2016-12-09 DIAGNOSIS — R05 Cough: Secondary | ICD-10-CM

## 2016-12-09 DIAGNOSIS — R059 Cough, unspecified: Secondary | ICD-10-CM

## 2016-12-09 DIAGNOSIS — Z79899 Other long term (current) drug therapy: Secondary | ICD-10-CM | POA: Insufficient documentation

## 2016-12-09 HISTORY — DX: Fear of injections and transfusions: F40.231

## 2016-12-09 MED ORDER — IPRATROPIUM-ALBUTEROL 0.5-2.5 (3) MG/3ML IN SOLN
3.0000 mL | Freq: Once | RESPIRATORY_TRACT | Status: AC
  Administered 2016-12-09: 3 mL via RESPIRATORY_TRACT
  Filled 2016-12-09: qty 3

## 2016-12-09 NOTE — ED Triage Notes (Signed)
Pt states she had an "asthma attack" this am.  She has done an albuterol nebulizer and rescue inhaler at home.   No respiratory distress noted.  She states she went to her pcp since last visit and was given antibiotics for bronchitis.

## 2016-12-09 NOTE — ED Provider Notes (Signed)
[facility] Note   CSN: [id number] Arrival date & time: 12/09/16  1036     History   Chief Complaint Chief Complaint  Patient presents with  . Shortness of Breath    recheck    HPI Courtney Zamora is a 31 y.o. female with history of asthma, allergies, who presents today with chief complaint sudden onset, progressively improving shortness of breath. She states that she woke up at 7:22 AM short of breath. She used her rescue inhaler at this time, with minimal improvement. One hour later she did a nebulizer treatment with some improvement. 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 She was told that her oxygen saturations were appropriate but that if symptoms persisted she should come to the emergency department. She was evaluated here 1 week ago for similar symptoms and discharged with a 6 day course of steroids which she has completed. She was seen by her primary care 2 days ago for follow-up of cough and congestion productive of brown mucus. She was diagnosed with bronchitis and given a azithromycin and Diflucan, which she is taking. She states that her nasal congestion and her cough have improved. She also states that her current shortness of breath has improved significantly PTA. She endorses dull achy chest wall pain, worsening with inspiration, which she states is typical of her asthma attacks. She also states that she has a migraine headache localized to the frontotemporal region with associated photophobia. She states this is typical of her migraines, and refuses treatment for it at this time. She denies fevers, chills, hemoptysis, abdominal pain, nausea, vomiting, diarrhea. She has no other complaints this time.  The history is provided by the patient and the spouse.    Past Medical History:  Diagnosis Date  . Anxiety   . Asthma   . Depression   . Obesity   . Obesity   . Seizures  (HCC)   . Severe needle phobia     Patient Active Problem List   Diagnosis Date Noted  . Metatarsal stress fracture, right, sequela 11/17/2016  . Post concussion syndrome 10/25/2016  . Syncope 10/05/2016  . Neck muscle spasm 10/05/2016  . Nipple discharge in female 09/13/2016  . Depression 08/17/2016  . Anxiety 08/17/2016  . ASCUS with positive high risk HPV cervical 08/14/2016  . Left foot pain 08/14/2016  . Hypertension 08/14/2016  . Long term (current) use of systemic steroids 08/14/2016  . Weight gain 01/12/2016  . Esophageal reflux 12/29/2015  . Morbid obesity (HCC) 12/29/2015  . Chronic pain of right knee 12/29/2015  . Chronic pain of left ankle 12/29/2015  . 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 Asthma, moderate persistent 12/09/2015    Past Surgical History:  Procedure Laterality Date  . TONSILLECTOMY      OB History    Gravida Para Term Preterm AB Living   1       1 0   SAB TAB Ectopic Multiple Live Births   1               Home Medications    Prior to Admission medications   Medication Sig Start Date End Date Taking? Authorizing Provider  acetaminophen-codeine (TYLENOL #3) 300-30 MG tablet Take 1 tablet by mouth every 6 (six) hours as needed. for pain 10/25/16   Dessa Phi, MD  acetaminophen-codeine (TYLENOL #4) 300-60 MG tablet Take 1 tablet by mouth every 6 (six)  hours as needed for moderate pain. 11/17/16   Funches, Gerilyn Nestle, MD  albuterol (PROVENTIL HFA;VENTOLIN HFA) 108 (90 Base) MCG/ACT inhaler Inhale 2 puffs into the lungs every 6 (six) hours as needed for wheezing or shortness of breath (wheezing). For shortness of breath. 03/18/16   Funches, Gerilyn Nestle, MD  albuterol (PROVENTIL) (2.5 MG/3ML) 0.083% nebulizer solution Take 3 mLs (2.5 mg total) by nebulization every 6 (six) hours as needed for wheezing or shortness of breath. 11/17/16   Dessa Phi, MD  azithromycin (ZITHROMAX) 250 MG tablet Take 2 today then 1 days 2-5 12/07/16   Anders Simmonds, PA-C    beclomethasone (QVAR) 80 MCG/ACT inhaler Inhale 2 puffs into the lungs 2 (two) times daily. 11/22/16 11/22/17  Storm Frisk, MD  budesonide-formoterol (SYMBICORT) 160-4.5 MCG/ACT inhaler Inhale 2 puffs into the lungs 2 (two) times daily. 03/18/16   Funches, Gerilyn Nestle, MD  buPROPion (WELLBUTRIN XL) 300 MG 24 hr tablet Take 1 tablet (300 mg total) by mouth daily. 11/17/16   Funches, Gerilyn Nestle, MD  Calcium Citrate 200 MG TABS Take 2 tablets (400 mg total) by mouth every other day. 10/05/16   Newt Lukes, MD  cetirizine (ZYRTEC) 10 MG tablet Take 1 tablet (10 mg total) by mouth daily. 11/22/16   Storm Frisk, MD  Cholecalciferol (VITAMIN D3) 5000 units CAPS Take 5,000 Units by mouth once a week.    [provider]  fluticasone (FLONASE) 50 MCG/ACT nasal spray Place 2 sprays into both nostrils 2 (two) times daily. 07/06/16   Storm Frisk, MD  gabapentin (NEURONTIN) 300 MG capsule Take 1 capsule (300 mg total) by mouth 3 (three) times daily. 10/05/16   Newt Lukes, MD  guaiFENesin-dextromethorphan (ROBITUSSIN DM) 100-10 MG/5ML syrup Take 5 mLs by mouth 2 (two) times daily.    [provider]  hydrochlorothiazide (HYDRODIURIL) 12.5 MG tablet Take 1 tablet (12.5 mg total) by mouth daily. 09/13/16   Dessa Phi, MD  Misc. Devices (CRUTCHES-ALUMINUM) MISC 1 each by Does not apply route daily. 11/17/16   Funches, Gerilyn Nestle, MD  pantoprazole (PROTONIX) 40 MG tablet TAKE 1 TABLET BY MOUTH DAILY 11/25/16   Dessa Phi, MD  Prenatal Vit-Fe Fumarate-FA (PRENATAL MULTIVITAMIN) TABS tablet Take 1 tablet by mouth daily at 12 noon.    [provider]  promethazine (PHENERGAN) 25 MG tablet Take 1 tablet (25 mg total) by mouth every 8 (eight) hours as needed for nausea or vomiting. 11/17/16   Funches, Gerilyn Nestle, MD  tiZANidine (ZANAFLEX) 4 MG tablet TAKE 1 TABLET BY MOUTH EVERY 6 HOURS AS NEEDED FOR MUSCLE SPASMS. Patient taking differently: Take 4 mg by mouth every 6 (six) hours  as needed for muscle spasms.  08/22/16   Dessa Phi, MD    Family History Family History  Problem Relation Age of Onset  . Hypothyroidism Mother   . Diabetes Father   . Diabetes Paternal Uncle   . Hypertension Maternal Grandmother   . Hypertension Maternal Grandfather   . Pancreatic cancer Maternal Grandfather   . Hypertension Paternal Grandmother   . Heart disease Paternal Grandfather   . Hypertension Paternal Grandfather     Social History Social History  Substance Use Topics  . Smoking status: Never Smoker  . Smokeless tobacco: Never Used  . Alcohol use No     Allergies   Bee venom; Cinnamon; Ibuprofen; and Tape   Review of Systems Review of Systems  Constitutional: Negative for chills and fever.  HENT: Positive for congestion. Negative for sore  throat.   Respiratory: Positive for cough and shortness of breath.   Cardiovascular: Positive for chest pain.  Gastrointestinal: Negative for abdominal pain, diarrhea, nausea and vomiting.  Neurological: Positive for headaches. Negative for syncope.  All other systems reviewed and are negative.    Physical Exam Updated Vital Signs BP 130/90 (BP Location: Left Arm)   Pulse (!) 116   Temp 98.8 F (37.1 C) (Oral)   Resp 18   Ht 5\' 4"  (1.626 m)   Wt (!) 166.9 kg (368 lb)   LMP 11/30/2016 Comment: NEG preg test on 11/17/16  SpO2 100%   BMI 63.17 kg/m   Physical Exam  Constitutional: She appears well-developed and well-nourished.  HENT:  Head: Normocephalic and atraumatic.  No frontal or maxillary sinus tenderness to palpation. Posterior oropharynx clear. Nasal septum midline with pink boggy mucosa.  Eyes: Conjunctivae are normal. Right eye exhibits no discharge. Left eye exhibits no discharge.  Neck: Normal range of motion. Neck supple. No JVD present. No tracheal deviation present. No thyromegaly present.  Cardiovascular: Normal rate, regular rhythm, normal heart sounds and intact distal pulses.     Pulmonary/Chest: Effort normal and breath sounds normal. She exhibits tenderness.  Mild basilar expiratory wheezes, bilateral anterior chest wall tender to palpation diffusely. No increased work of breathing noted. No nasal flaring or intercostal retractions or tripoding.  Abdominal: She exhibits no distension.  Musculoskeletal: She exhibits no edema.  Lymphadenopathy:    She has no cervical adenopathy.  Neurological: She is alert.  Skin: Skin is warm and dry.  Psychiatric: She has a normal mood and affect. Her behavior is normal.     ED Treatments / Results  Labs (all labs ordered are listed, but only abnormal results are displayed) Labs Reviewed - No data to display  EKG  EKG Interpretation None       Radiology Dg Chest 2 View  Result Date: 12/09/2016 CLINICAL DATA:  Cough.  Asthma attack last night. EXAM: CHEST  2 VIEW COMPARISON:  12/02/2016 FINDINGS: The heart size and mediastinal contours are within normal limits. Both lungs are clear. The visualized skeletal structures are unremarkable. IMPRESSION: Normal chest x-ray. Electronically Signed   By: 12/04/2016 M.D.   On: 12/09/2016 11:55    Procedures Procedures (including critical care time)  Medications Ordered in ED Medications  ipratropium-albuterol (DUONEB) 0.5-2.5 (3) MG/3ML nebulizer solution 3 mL (3 mLs Nebulization Given 12/09/16 1157)     Initial Impression / Assessment and Plan / ED Course  I have reviewed the triage vital signs and the nursing notes.  Pertinent labs & imaging results that were available during my care of the patient were reviewed by me and considered in my medical decision making (see chart for details).     Patient returns for new onset asthma exacerbation today which almost completely resolved prior to arrival to the ED. Afebrile, vital signs are stable and SPO2 100% on room air. She is in no apparent distress in the room. Mild expiratory wheezes heard at the bases on auscultation.  With productive cough, obtained chest x-ray which was normal. Low suspicion of pneumonia, pleural effusion. She was given a nebulizer treatment here with resolution of wheezes on re-auscultation. Encourage patient to continue taking the antibiotics prescribed to her by her primary care to completion. Encouraged follow-up with her pulmonologist. She states he is currently booked out for several months, so provided information for pulmonologist on-call today. Discussed indications for return to the ED. Pt verbalized understanding of and  agreement with plan and is safe for discharge home at this time.  Final Clinical Impressions(s) / ED Diagnoses   Final diagnoses:  Mild intermittent asthma with exacerbation  Cough    New Prescriptions New Prescriptions   No medications on file     Bennye Alm 12/09/16 1241    Tilden Fossa, MD 12/10/16 [phone]

## 2016-12-09 NOTE — Discharge Instructions (Signed)
Take antibiotics to completion. Use your rescue inhaler as prescribed. Follow-up with your pulmonologist for reevaluation of your asthma. Return to the ED if any concerning symptoms develop.

## 2016-12-13 ENCOUNTER — Encounter: Payer: Self-pay | Admitting: Family Medicine

## 2016-12-13 ENCOUNTER — Ambulatory Visit: Payer: Self-pay | Attending: Family Medicine | Admitting: Family Medicine

## 2016-12-13 ENCOUNTER — Other Ambulatory Visit: Payer: Self-pay | Admitting: Family Medicine

## 2016-12-13 VITALS — BP 166/92 | HR 111 | Temp 98.4°F | Wt 366.2 lb

## 2016-12-13 DIAGNOSIS — M25561 Pain in right knee: Principal | ICD-10-CM

## 2016-12-13 DIAGNOSIS — M25572 Pain in left ankle and joints of left foot: Secondary | ICD-10-CM

## 2016-12-13 DIAGNOSIS — J0110 Acute frontal sinusitis, unspecified: Secondary | ICD-10-CM | POA: Insufficient documentation

## 2016-12-13 DIAGNOSIS — J45909 Unspecified asthma, uncomplicated: Secondary | ICD-10-CM | POA: Insufficient documentation

## 2016-12-13 DIAGNOSIS — Z79899 Other long term (current) drug therapy: Secondary | ICD-10-CM | POA: Insufficient documentation

## 2016-12-13 DIAGNOSIS — Z7951 Long term (current) use of inhaled steroids: Secondary | ICD-10-CM | POA: Insufficient documentation

## 2016-12-13 DIAGNOSIS — I10 Essential (primary) hypertension: Secondary | ICD-10-CM | POA: Insufficient documentation

## 2016-12-13 DIAGNOSIS — Z6841 Body Mass Index (BMI) 40.0 and over, adult: Secondary | ICD-10-CM | POA: Insufficient documentation

## 2016-12-13 DIAGNOSIS — G8929 Other chronic pain: Secondary | ICD-10-CM

## 2016-12-13 MED ORDER — AMOXICILLIN 875 MG PO TABS: 875.0000 mg | ORAL_TABLET | Freq: Two times a day (BID) | ORAL | 0 refills | Status: DC

## 2016-12-13 MED ORDER — PREDNISONE 10 MG PO TABS: ORAL_TABLET | ORAL | 0 refills | Status: DC

## 2016-12-13 MED ORDER — AMOXICILLIN 500 MG PO TABS: 500.0000 mg | ORAL_TABLET | Freq: Three times a day (TID) | ORAL | 0 refills | Status: DC

## 2016-12-13 MED FILL — ACETAMINOPHEN/COD #3 TABLET: 300-30 | 22 days supply | Qty: 90 | Fill #2

## 2016-12-13 MED FILL — predniSONE 10 MG TABS: 10 | 9 days supply | Qty: 21 | Fill #0

## 2016-12-13 MED FILL — ?BUPROPION HCL XL 300 MG TA: 300 | 12 days supply | Qty: 12 | Fill #1

## 2016-12-13 MED FILL — AMOXICILLIN 500 MG CAPSULE: 500 | 10 days supply | Qty: 30 | Fill #0

## 2016-12-13 NOTE — Patient Instructions (Signed)
Diagnoses and all orders for this visit:  Acute frontal sinusitis, recurrence not specified -     amoxicillin (AMOXIL) 875 MG tablet; Take 1 tablet (875 mg total) by mouth 2 (two) times daily. -     predniSONE (DELTASONE) 10 MG tablet; Take by mouth  4 for 2 days, 3 for 2 days, 2 for 2 days, 1 for 3 days then stop   Continue Qvar Albuterol every 4 hrs space as tolerated Prednisone taper  amoxicillin   F/u in 10 days for upper respiratory infection triggering asthma  Dr. Armen Pickup

## 2016-12-13 NOTE — Progress Notes (Signed)
Subjective:  Patient ID: Courtney Zamora, female    DOB: June 28, 1986  Age: 31 y.o. MRN: [id number]  CC: Bronchitis   HPI Caycee L Fross has morbid obesity, chronic pain, HTN, asthma, recent ASCUS pap she presents for  1. Bronchitis: she reports L sided chest pain, fever, chills, shortness of breath since 12/02/2016. She has been seen in the ED on 12/02/16, she followed up in the ofifce on 12/07/16, she returned back to the ED on 12/09/16. CXR on 12/02/16 was normal. CXR on 12/09/16 was normal.  She has been treated with prednisone and azithromycin since onset of symptoms without improvement.  Today she reports tightness in chest, productive cough, shortness of breath at all times. She is taking albuterol every 4 hours. She also reports sinus pressure and congestion.   Social History  Substance Use Topics  . Smoking status: Never Smoker  . Smokeless tobacco: Never Used  . Alcohol use No   Outpatient Medications Prior to Visit  Medication Sig Dispense Refill  . acetaminophen-codeine (TYLENOL #3) 300-30 MG tablet Take 1 tablet by mouth every 6 (six) hours as needed. for pain 90 tablet 2  . acetaminophen-codeine (TYLENOL #4) 300-60 MG tablet Take 1 tablet by mouth every 6 (six) hours as needed for moderate pain. 45 tablet 2  . albuterol (PROVENTIL HFA;VENTOLIN HFA) 108 (90 Base) MCG/ACT inhaler Inhale 2 puffs into the lungs every 6 (six) hours as needed for wheezing or shortness of breath (wheezing). For shortness of breath. 54 Inhaler 3  . albuterol (PROVENTIL) (2.5 MG/3ML) 0.083% nebulizer solution Take 3 mLs (2.5 mg total) by nebulization every 6 (six) hours as needed for wheezing or shortness of breath. 150 mL 1  . azithromycin (ZITHROMAX) 250 MG tablet Take 2 today then 1 days 2-5 6 tablet 0  . beclomethasone (QVAR) 80 MCG/ACT inhaler Inhale 2 puffs into the lungs 2 (two) times daily. 1 Inhaler 12  . budesonide-formoterol (SYMBICORT) 160-4.5 MCG/ACT inhaler Inhale 2 puffs into the lungs 2  (two) times daily. 3 Inhaler 3  . buPROPion (WELLBUTRIN XL) 300 MG 24 hr tablet Take 1 tablet (300 mg total) by mouth daily. 30 tablet 2  . Calcium Citrate 200 MG TABS Take 2 tablets (400 mg total) by mouth every other day.    . cetirizine (ZYRTEC) 10 MG tablet Take 1 tablet (10 mg total) by mouth daily. 30 tablet 11  . Cholecalciferol (VITAMIN D3) 5000 units CAPS Take 5,000 Units by mouth once a week.    . fluticasone (FLONASE) 50 MCG/ACT nasal spray Place 2 sprays into both nostrils 2 (two) times daily. 16 g 6  . gabapentin (NEURONTIN) 300 MG capsule Take 1 capsule (300 mg total) by mouth 3 (three) times daily. 90 capsule 1  . guaiFENesin-dextromethorphan (ROBITUSSIN DM) 100-10 MG/5ML syrup Take 5 mLs by mouth 2 (two) times daily.    . hydrochlorothiazide (HYDRODIURIL) 12.5 MG tablet Take 1 tablet (12.5 mg total) by mouth daily. 90 tablet 3  . Misc. Devices (CRUTCHES-ALUMINUM) MISC 1 each by Does not apply route daily. 2 each 0  . pantoprazole (PROTONIX) 40 MG tablet TAKE 1 TABLET BY MOUTH DAILY 30 tablet 3  . Prenatal Vit-Fe Fumarate-FA (PRENATAL MULTIVITAMIN) TABS tablet Take 1 tablet by mouth daily at 12 noon.    . promethazine (PHENERGAN) 25 MG tablet Take 1 tablet (25 mg total) by mouth every 8 (eight) hours as needed for nausea or vomiting. 2060 tablet 2  . tiZANidine (ZANAFLEX) 4 MG tablet TAKE 1  TABLET BY MOUTH EVERY 6 HOURS AS NEEDED FOR MUSCLE SPASMS. (Patient taking differently: Take 4 mg by mouth every 6 (six) hours as needed for muscle spasms. ) 120 tablet 3   No facility-administered medications prior to visit.     ROS Review of Systems  Constitutional: Positive for appetite change. Negative for chills and fever.  Eyes: Negative for visual disturbance.  Respiratory: Positive for cough, chest tightness and shortness of breath.   Cardiovascular: Negative for chest pain and leg swelling.  Gastrointestinal: Positive for nausea. Negative for abdominal pain and blood in stool.    Genitourinary: Negative for pelvic pain.  Musculoskeletal: Positive for arthralgias and gait problem. Negative for back pain.  Skin: Negative for rash.  Allergic/Immunologic: Negative for immunocompromised state.  Neurological: Negative for headaches.  Hematological: Negative for adenopathy. Does not bruise/bleed easily.  Psychiatric/Behavioral: Positive for dysphoric mood. Negative for suicidal ideas. The patient is nervous/anxious.     Objective:  BP (!) 166/92   Pulse (!) 111   Temp 98.4 F (36.9 C) (Oral)   Wt (!) 366 lb 3.2 oz (166.1 kg)   LMP 11/30/2016 Comment: NEG preg test on 11/17/16  SpO2 99%   BMI 62.86 kg/m   BP/Weight 12/13/2016 12/09/2016 12/07/2016  Systolic BP 166 130 144  Diastolic BP 92 90 81  Wt. (Lbs) 366.2 368 368  BMI 62.86 63.17 63.17    Physical Exam  Constitutional: She is oriented to person, place, and time. She appears well-developed and well-nourished. No distress.  Obese   HENT:  Head: Normocephalic and atraumatic.  Right Ear: Tympanic membrane, external ear and ear canal normal.  Left Ear: Tympanic membrane, external ear and ear canal normal.  Nose: No mucosal edema. Right sinus exhibits frontal sinus tenderness. Right sinus exhibits no maxillary sinus tenderness. Left sinus exhibits frontal sinus tenderness. Left sinus exhibits no maxillary sinus tenderness.  Mouth/Throat: Oropharynx is clear and moist.  Cardiovascular: Regular rhythm, normal heart sounds and intact distal pulses.  Tachycardia present.   Pulmonary/Chest: Effort normal and breath sounds normal. Right breast exhibits no inverted nipple, no mass, no nipple discharge, no skin change and no tenderness. Left breast exhibits no inverted nipple, no mass, no nipple discharge, no skin change and no tenderness. Breasts are symmetrical.  Musculoskeletal: She exhibits no edema.  Neurological: She is alert and oriented to person, place, and time.  Skin: Skin is warm and dry. No rash noted.   Psychiatric: She has a normal mood and affect.    Depression screen [facility] 2/9 12/13/2016 12/07/2016 11/17/2016  Decreased Interest 2 1 2   Down, Depressed, Hopeless 1 0 1  PHQ - 2 Score 3 1 3   Altered sleeping 2 2 3   Tired, decreased energy 2 2 2   Change in appetite 2 2 2   Feeling bad or failure about yourself  0 0 0  Trouble concentrating 2 (No Data) 2  Moving slowly or fidgety/restless 1 0 2  Suicidal thoughts 0 0 0  PHQ-9 Score 12 7 14   Some recent data might be hidden   GAD 7 : Generalized Anxiety Score 12/13/2016 12/07/2016 10/25/2016 10/05/2016  Nervous, Anxious, on Edge 3 2 2 3   Control/stop worrying 1 1 3 2   Worry too much - different things 1 0 3 3  Trouble relaxing 3 2 3 3   Restless 3 2 2 1   Easily annoyed or irritable 3 3 3 3   Afraid - awful might happen 0 0 1 1  Total GAD 7 Score 14  10 17 16       Assessment & Plan:  Evelean was seen today for bronchitis.  Diagnoses and all orders for this visit:  Acute frontal sinusitis, recurrence not specified -     Discontinue: amoxicillin (AMOXIL) 875 MG tablet; Take 1 tablet (875 mg total) by mouth 2 (two) times daily. -     Discontinue: predniSONE (DELTASONE) 10 MG tablet; Take by mouth  4 for 2 days, 3 for 2 days, 2 for 2 days, 1 for 3 days then stop -     Discontinue: amoxicillin (AMOXIL) 875 MG tablet; Take 1 tablet (875 mg total) by mouth 2 (two) times daily. -     predniSONE (DELTASONE) 10 MG tablet; Take by mouth  4 for 2 days, 3 for 2 days, 2 for 2 days, 1 for 3 days then stop -     amoxicillin (AMOXIL) 500 MG tablet; Take 1 tablet (500 mg total) by mouth 3 (three) times daily.   There are no diagnoses linked to this encounter.  No orders of the defined types were placed in this encounter.   Follow-up: Return in about 10 days (around 12/23/2016) for asthma exacerbation .   02/22/2017 MD

## 2016-12-15 DIAGNOSIS — J0110 Acute frontal sinusitis, unspecified: Secondary | ICD-10-CM | POA: Insufficient documentation

## 2016-12-15 NOTE — Assessment & Plan Note (Signed)
Sinus pressure with cough and asthma exacerbation Plan: Prednisone taper Amoxicillin Albuterol prn Continue daily Symbicort

## 2016-12-26 ENCOUNTER — Other Ambulatory Visit: Payer: Self-pay | Admitting: Family Medicine

## 2016-12-26 DIAGNOSIS — F329 Major depressive disorder, single episode, unspecified: Secondary | ICD-10-CM

## 2016-12-26 MED FILL — ?BUPROPION HCL XL 300 MG TA: 300 | 30 days supply | Qty: 30 | Fill #2

## 2016-12-29 ENCOUNTER — Other Ambulatory Visit: Payer: Self-pay | Admitting: Family Medicine

## 2016-12-29 ENCOUNTER — Ambulatory Visit: Payer: Self-pay | Admitting: Internal Medicine

## 2016-12-29 DIAGNOSIS — M25561 Pain in right knee: Principal | ICD-10-CM

## 2016-12-29 DIAGNOSIS — M25572 Pain in left ankle and joints of left foot: Secondary | ICD-10-CM

## 2016-12-29 DIAGNOSIS — G8929 Other chronic pain: Secondary | ICD-10-CM

## 2016-12-29 MED FILL — tiZANidine HCL 4 MG TABS: 4 | 27 days supply | Qty: 110 | Fill #3

## 2016-12-29 MED FILL — ?CETIRIZINE HCL 10 MG TABLE: 10 | 30 days supply | Qty: 30 | Fill #1

## 2016-12-29 MED FILL — PROMETHAZINE 25 MG TABLET: 25 | 30 days supply | Qty: 90 | Fill #1

## 2016-12-29 MED FILL — PANTOPRAZOLE SOD DR 40 MG T: 40 | 30 days supply | Qty: 30 | Fill #1

## 2016-12-30 ENCOUNTER — Ambulatory Visit: Payer: Self-pay | Admitting: Family Medicine

## 2017-01-05 ENCOUNTER — Encounter: Payer: Self-pay | Admitting: Internal Medicine

## 2017-01-05 ENCOUNTER — Ambulatory Visit: Payer: Self-pay | Attending: Internal Medicine | Admitting: Internal Medicine

## 2017-01-05 VITALS — BP 110/76 | HR 88 | Temp 98.2°F | Resp 16 | Wt 375.2 lb

## 2017-01-05 DIAGNOSIS — Z7952 Long term (current) use of systemic steroids: Secondary | ICD-10-CM | POA: Insufficient documentation

## 2017-01-05 DIAGNOSIS — F329 Major depressive disorder, single episode, unspecified: Secondary | ICD-10-CM | POA: Insufficient documentation

## 2017-01-05 DIAGNOSIS — Z79899 Other long term (current) drug therapy: Secondary | ICD-10-CM | POA: Insufficient documentation

## 2017-01-05 DIAGNOSIS — Z9889 Other specified postprocedural states: Secondary | ICD-10-CM | POA: Insufficient documentation

## 2017-01-05 DIAGNOSIS — J4540 Moderate persistent asthma, uncomplicated: Secondary | ICD-10-CM | POA: Insufficient documentation

## 2017-01-05 DIAGNOSIS — Z800 Family history of malignant neoplasm of digestive organs: Secondary | ICD-10-CM | POA: Insufficient documentation

## 2017-01-05 DIAGNOSIS — Z833 Family history of diabetes mellitus: Secondary | ICD-10-CM | POA: Insufficient documentation

## 2017-01-05 DIAGNOSIS — M25562 Pain in left knee: Secondary | ICD-10-CM

## 2017-01-05 DIAGNOSIS — R55 Syncope and collapse: Secondary | ICD-10-CM | POA: Insufficient documentation

## 2017-01-05 DIAGNOSIS — F0781 Postconcussional syndrome: Secondary | ICD-10-CM | POA: Insufficient documentation

## 2017-01-05 DIAGNOSIS — J0110 Acute frontal sinusitis, unspecified: Secondary | ICD-10-CM | POA: Insufficient documentation

## 2017-01-05 DIAGNOSIS — I10 Essential (primary) hypertension: Secondary | ICD-10-CM | POA: Insufficient documentation

## 2017-01-05 DIAGNOSIS — N6452 Nipple discharge: Secondary | ICD-10-CM | POA: Insufficient documentation

## 2017-01-05 DIAGNOSIS — M62838 Other muscle spasm: Secondary | ICD-10-CM | POA: Insufficient documentation

## 2017-01-05 DIAGNOSIS — R634 Abnormal weight loss: Secondary | ICD-10-CM | POA: Insufficient documentation

## 2017-01-05 DIAGNOSIS — G8929 Other chronic pain: Secondary | ICD-10-CM | POA: Insufficient documentation

## 2017-01-05 DIAGNOSIS — K219 Gastro-esophageal reflux disease without esophagitis: Secondary | ICD-10-CM | POA: Insufficient documentation

## 2017-01-05 DIAGNOSIS — F419 Anxiety disorder, unspecified: Secondary | ICD-10-CM | POA: Insufficient documentation

## 2017-01-05 DIAGNOSIS — S93401A Sprain of unspecified ligament of right ankle, initial encounter: Secondary | ICD-10-CM | POA: Insufficient documentation

## 2017-01-05 DIAGNOSIS — Z8249 Family history of ischemic heart disease and other diseases of the circulatory system: Secondary | ICD-10-CM | POA: Insufficient documentation

## 2017-01-05 MED ORDER — ACETAMINOPHEN-CODEINE #3 300-30 MG PO TABS: 1.0000 | ORAL_TABLET | Freq: Four times a day (QID) | ORAL | 2 refills | Status: DC | PRN

## 2017-01-05 MED ORDER — HYDROXYZINE HCL 25 MG PO TABS: 12.5000 mg | ORAL_TABLET | Freq: Every day | ORAL | 1 refills | Status: DC | PRN

## 2017-01-05 MED FILL — ?HYDROXYZINE HCL 25 MG TAB: 25 MG | 24 days supply | Qty: 24 | Fill #0

## 2017-01-05 MED FILL — ACETAMINOPHEN/COD #3 TABLET: 300-30 | 30 days supply | Qty: 120 | Fill #0

## 2017-01-05 NOTE — Progress Notes (Signed)
Patient ID: Courtney Zamora, female    DOB: Mar 23, 1986  MRN: [id number]  CC: re-establish and Follow-up   Subjective: Courtney Zamora is a 31 y.o. female who presents for follow-up visit. PCP was Dr. Armen Pickup Her concerns today include:   Patient with history of moderate persistent asthma, morbid obesity, hypertension, depression, chronic knee pain due to on meniscus and osteoarthritis based on MRI done 04/2016,  1. Twisted her RT ankle on a rock while helping her  Mother-in-law 4 days ago. -+ swelling  -able to bear wgh.  -taking Tylenol #3 for moderate pain and # 4 when pain is more severe, and Neurontin Neurontin  2.  Asthma _ "my asthma goes nuts during June and July." -Albuterol inhaler Q 3-4 hrs but this wk about 2 x a day.  Has not used neb in past 2 wks -compliant with Symbicort.  -nonsmoker.  -anxiety causes asthma to spike at times.  Wants something to help calm her down when she does feel anxious. States that she mentioned this to her previous physician and was told to take deep breaths and try to draw something. States that this does not work for her. She is on Wellbutrin which she states was started to help with weight loss  3. She was seeing ortho for her LT knee and ankle fx at work in 2014.  Fell dwn stairs.   -saw ortho last mth ([facility]) for knees and ankles and for fx of bone of 2nd RT toe -Saw Dr. [name] in December. Weight loss was strongly encouraged. According to his note patient was not very receptive to some of his recommendations including water aerobics, weight loss, bariatric surgery etc. -Tried applying for disability. Saw disability physician which she states exam and mainly her elbows and not her knees or feet. She was not pleased with the outcome.  4. Obesity: Wt Readings from Last 3 Encounters:  01/05/17 (!) 375 lb 3.2 oz (170.2 kg)  12/13/16 (!) 366 lb 3.2 oz (166.1 kg)  12/09/16 (!) 368 lb (166.9 kg)  -She has been monitoring calorie intact.   She uses  food app On her phone to help count calorie.  -restricting to about 1500 -2500 cal/day.  -not doing much for exercise "because of my knees." Getting out more with her nieces to swimming 1-2 x a wk.  -has not seen a nutritionist. No insurance States she was put on Wellbutrin mainly to help with weight loss.    Patient Active Problem List   Diagnosis Date Noted  . Acute frontal sinusitis 12/15/2016  . Metatarsal stress fracture, right, sequela 11/17/2016  . Post concussion syndrome 10/25/2016  . Syncope 10/05/2016  . Neck muscle spasm 10/05/2016  . Nipple discharge in female 09/13/2016  . Depression 08/17/2016  . Anxiety 08/17/2016  . ASCUS with positive high risk HPV cervical 08/14/2016  . Left foot pain 08/14/2016  . Hypertension 08/14/2016  . Long term (current) use of systemic steroids 08/14/2016  . Weight gain 01/12/2016  . Esophageal reflux 12/29/2015  . Morbid obesity (HCC) 12/29/2015  . Chronic pain of right knee 12/29/2015  . Chronic pain of left ankle 12/29/2015  . 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 Asthma, moderate persistent 12/09/2015     Current Outpatient Prescriptions on File Prior to Visit  Medication Sig Dispense Refill  . albuterol (PROVENTIL HFA;VENTOLIN HFA) 108 (90 Base) MCG/ACT inhaler Inhale 2 puffs into the lungs every 6 (six) hours as needed for wheezing or shortness of breath (wheezing).  For shortness of breath. 54 Inhaler 3  . albuterol (PROVENTIL) (2.5 MG/3ML) 0.083% nebulizer solution Take 3 mLs (2.5 mg total) by nebulization every 6 (six) hours as needed for wheezing or shortness of breath. 150 mL 1  . budesonide-formoterol (SYMBICORT) 160-4.5 MCG/ACT inhaler Inhale 2 puffs into the lungs 2 (two) times daily. 3 Inhaler 3  . buPROPion (WELLBUTRIN XL) 300 MG 24 hr tablet Take 1 tablet (300 mg total) by mouth daily. 30 tablet 2  . Calcium Citrate 200 MG TABS Take 2 tablets (400 mg total) by mouth every other day.    . cetirizine (ZYRTEC) 10 MG tablet  Take 1 tablet (10 mg total) by mouth daily. 30 tablet 11  . Cholecalciferol (VITAMIN D3) 5000 units CAPS Take 5,000 Units by mouth once a week.    . fluticasone (FLONASE) 50 MCG/ACT nasal spray Place 2 sprays into both nostrils 2 (two) times daily. 16 g 6  . gabapentin (NEURONTIN) 300 MG capsule Take 1 capsule (300 mg total) by mouth 3 (three) times daily. 90 capsule 1  . hydrochlorothiazide (HYDRODIURIL) 12.5 MG tablet Take 1 tablet (12.5 mg total) by mouth daily. 90 tablet 3  . pantoprazole (PROTONIX) 40 MG tablet TAKE 1 TABLET BY MOUTH DAILY 30 tablet 3  . Prenatal Vit-Fe Fumarate-FA (PRENATAL MULTIVITAMIN) TABS tablet Take 1 tablet by mouth daily at 12 noon.    . promethazine (PHENERGAN) 25 MG tablet Take 1 tablet (25 mg total) by mouth every 8 (eight) hours as needed for nausea or vomiting. 2060 tablet 2  . tiZANidine (ZANAFLEX) 4 MG tablet TAKE 1 TABLET BY MOUTH EVERY 6 HOURS AS NEEDED FOR MUSCLE SPASMS. (Patient taking differently: Take 4 mg by mouth every 6 (six) hours as needed for muscle spasms. ) 120 tablet 3  . acetaminophen-codeine (TYLENOL #3) 300-30 MG tablet Take 1 tablet by mouth every 6 (six) hours as needed. for pain 90 tablet 2  . acetaminophen-codeine (TYLENOL #4) 300-60 MG tablet Take 1 tablet by mouth every 6 (six) hours as needed for moderate pain. 45 tablet 2  . amoxicillin (AMOXIL) 500 MG tablet Take 1 tablet (500 mg total) by mouth 3 (three) times daily. (Patient not taking: Reported on 01/05/2017) 30 tablet 0  . beclomethasone (QVAR) 80 MCG/ACT inhaler Inhale 2 puffs into the lungs 2 (two) times daily. (Patient not taking: Reported on 01/05/2017) 1 Inhaler 12  . guaiFENesin-dextromethorphan (ROBITUSSIN DM) 100-10 MG/5ML syrup Take 5 mLs by mouth 2 (two) times daily.    . Misc. Devices (CRUTCHES-ALUMINUM) MISC 1 each by Does not apply route daily. 2 each 0  . predniSONE (DELTASONE) 10 MG tablet Take by mouth  4 for 2 days, 3 for 2 days, 2 for 2 days, 1 for 3 days then stop  (Patient not taking: Reported on 01/05/2017) 21 tablet 0   No current facility-administered medications on file prior to visit.     Allergies  Allergen Reactions  . Bee Venom Anaphylaxis  . Cinnamon Anaphylaxis  . Ibuprofen Nausea Only  . Tape Hives and Rash    EKG STICKERS give pt rash, hives    Social History   Social History  . Marital status: Married    Spouse name: N/A  . Number of children: N/A  . Years of education: N/A   Occupational History  . Not on file.   Social History Main Topics  . Smoking status: Never Smoker  . Smokeless tobacco: Never Used  . Alcohol use No  . Drug use:  No  . Sexual activity: Yes    Birth control/ protection: None   Other Topics Concern  . Not on file   Social History Narrative  . No narrative on file    Family History  Problem Relation Age of Onset  . Hypothyroidism Mother   . Diabetes Father   . Diabetes Paternal Uncle   . Hypertension Maternal Grandmother   . Hypertension Maternal Grandfather   . Pancreatic cancer Maternal Grandfather   . Hypertension Paternal Grandmother   . Heart disease Paternal Grandfather   . Hypertension Paternal Grandfather     Past Surgical History:  Procedure Laterality Date  . TONSILLECTOMY      ROS: Review of Systems as stated above PHYSICAL EXAM: BP (!) 159/86   Pulse 88   Temp 98.2 F (36.8 C) (Oral)   Resp 16   Wt (!) 375 lb 3.2 oz (170.2 kg)   SpO2 100%   BMI 64.40 kg/m    repeat BP with large cuff 110/76 Physical Exam  General appearance - alert, well appearing, morbidly obese young Caucasian female and in no distress Mental status - alert, oriented to person, place, and time, normal mood, behavior, speech, dress, motor activity, and thought processes Mouth - mucous membranes moist, pharynx normal without lesions Neck - supple, no significant adenopathy Chest - clear to auscultation, no wheezes, rales or rhonchi, symmetric air entry Heart - normal rate, regular rhythm,  normal S1, S2, no murmurs, rubs, clicks or gallops Musculoskeletal - large body habitus. Right ankle: No edema appreciated. No ecchymosis. She is able to bear weight. Good range of motion. Extremities -trace lower extremity edema  Depression screen [facility] 2/9 01/05/2017 12/13/2016 12/07/2016 11/17/2016 10/25/2016  Decreased Interest 1 2 1 2 2   Down, Depressed, Hopeless 2 1 0 1 2  PHQ - 2 Score 3 3 1 3 4   Altered sleeping 3 2 2 3 3   Tired, decreased energy 3 2 2 2 3   Change in appetite 1 2 2 2 3   Feeling bad or failure about yourself  0 0 0 0 3  Trouble concentrating 1 2 (No Data) 2 2  Moving slowly or fidgety/restless 0 1 0 2 3  Suicidal thoughts 0 0 0 0 0  PHQ-9 Score 11 12 7 14 21   Some recent data might be hidden   GAD 7 : Generalized Anxiety Score 01/05/2017 12/13/2016 12/07/2016 10/25/2016  Nervous, Anxious, on Edge 1 3 2 2   Control/stop worrying 2 1 1 3   Worry too much - different things 2 1 0 3  Trouble relaxing 3 3 2 3   Restless 3 3 2 2   Easily annoyed or irritable 3 3 3 3   Afraid - awful might happen 0 0 0 1  Total GAD 7 Score 14 14 10 17     ASSESSMENT AND PLAN: 1. Moderate persistent asthma without complication -Controlled over the past 2 weeks. -I have given her a low dose of hydroxyzine to use when necessary for anxiety.  2. Essential hypertension Controlled.  3. Morbid obesity (HCC) -Patient states that she is restricting her calorie intake to around 1500 cal a day which I find hard to believe that she has been doing that and has gained instead of lost weight. Some dietary counseling given -including avoiding sweet drinks, decreasing white carbs. She would benefit from seeing a nutritionist but does not have insurance at this time. -I encouraged her to increase her swimming to about 3 times a week as tolerated.  4.  Sprain of right ankle, unspecified ligament, initial encounter -Seems mild at this time. No intervention needed.  5. Chronic pain of left knee -Weight loss  encouraged. -I discussed with her that I will not be prescribing her with Tylenol No. 3 and #4. I asked her to choose one. It is decided on the Tylenol 3's. We will increase the amount from 90 per month 120 tablets per month with instructions to take every 6 hours as needed.  She had one refill left on the Tylenol No. 4 is at [facility] on him sleep. I told her that I will have my CMA call and cancel that remaining refill. -Pain management agreement was reviewed and updated.  Patient was given the opportunity to ask questions.  Patient verbalized understanding of the plan and was able to repeat key elements of the plan.   No orders of the defined types were placed in this encounter.    Requested Prescriptions   Signed Prescriptions Disp Refills  . hydrOXYzine (ATARAX/VISTARIL) 25 MG tablet 30 tablet 1    Sig: Take 0.5 tablets (12.5 mg total) by mouth daily as needed for anxiety.  Marland Kitchen acetaminophen-codeine (TYLENOL #3) 300-30 MG tablet 120 tablet 2    Sig: Take 1 tablet by mouth every 6 (six) hours as needed for moderate pain.    Return in about 2 months (around 03/07/2017).  Jonah Blue, MD, FACP

## 2017-01-05 NOTE — Patient Instructions (Signed)
Use Hydroxyzine as needed for anxiety.  Follow a Healthy Eating Plan - You can do it! Limit sugary drinks.  Avoid sodas, sweet tea, sport or energy drinks, or fruit drinks.  Drink water, lo-fat milk, or diet drinks. Limit snack foods.   Cut back on candy, cake, cookies, chips, ice cream.  These are a special treat, only in small amounts. Eat plenty of vegetables.  Especially dark green, red, and orange vegetables. Aim for at least 3 servings a day. More is better! Include fruit in your daily diet.  Whole fruit is much healthier than fruit juice! Limit "white" bread, "white" pasta, "white" rice.   Choose "100% whole grain" products, brown or wild rice. Avoid fatty meats. Try "Meatless Monday" and choose eggs or beans one day a week.  When eating meat, choose lean meats like chicken, [place], and fish.  Grill, broil, or bake meats instead of frying, and eat poultry without the skin. Eat less salt.  Avoid frozen pizzas, frozen dinners and salty foods.  Use seasonings other than salt in cooking.  This can help blood pressure and keep you from swelling Beer, wine and liquor have calories.  If you can safely drink alcohol, limit to 1 drink per day for women, 2 drinks for men

## 2017-01-05 NOTE — Telephone Encounter (Signed)
Addressed during OV

## 2017-01-06 MED FILL — ?HYDROCHLOROTHIAZIDE 12.5MG: 12.5 | 23 days supply | Qty: 23 | Fill #4

## 2017-01-06 MED FILL — DICLOFENAC SOD DR 75 MG TAB: 75 | 30 days supply | Qty: 60 | Fill #3

## 2017-01-10 ENCOUNTER — Encounter: Payer: Self-pay | Admitting: Internal Medicine

## 2017-01-10 ENCOUNTER — Other Ambulatory Visit: Payer: Self-pay | Admitting: Internal Medicine

## 2017-01-10 ENCOUNTER — Other Ambulatory Visit: Payer: Self-pay | Admitting: Family Medicine

## 2017-01-10 DIAGNOSIS — M25532 Pain in left wrist: Secondary | ICD-10-CM

## 2017-01-10 DIAGNOSIS — J0110 Acute frontal sinusitis, unspecified: Secondary | ICD-10-CM

## 2017-01-10 MED ORDER — GABAPENTIN 300 MG PO CAPS: 300.0000 mg | ORAL_CAPSULE | Freq: Three times a day (TID) | ORAL | 4 refills | Status: DC

## 2017-01-10 MED ORDER — FLUCONAZOLE 150 MG PO TABS: 150.0000 mg | ORAL_TABLET | Freq: Once | ORAL | 0 refills | Status: AC

## 2017-01-10 MED FILL — FLUCONAZOLE 150 MG TABLET: 150 | 1 days supply | Qty: 1 | Fill #0

## 2017-01-11 ENCOUNTER — Encounter: Payer: Self-pay | Admitting: Internal Medicine

## 2017-01-11 ENCOUNTER — Other Ambulatory Visit: Payer: Self-pay | Admitting: Internal Medicine

## 2017-01-11 DIAGNOSIS — M25532 Pain in left wrist: Secondary | ICD-10-CM

## 2017-01-11 MED ORDER — PREDNISONE 10 MG PO TABS: ORAL_TABLET | ORAL | 0 refills | Status: DC

## 2017-01-11 MED FILL — GABAPENTIN 300 MG CAPSULE: 300 | 30 days supply | Qty: 90 | Fill #0

## 2017-01-11 MED FILL — ?PREDNISONE 10 MG TABLET: 10 | 9 days supply | Qty: 21 | Fill #0

## 2017-01-24 MED FILL — ?HYDROCHLOROTHIAZIDE 12.5MG: 12.5 | 30 days supply | Qty: 30 | Fill #5

## 2017-01-25 ENCOUNTER — Ambulatory Visit: Payer: Self-pay | Attending: Critical Care Medicine | Admitting: Critical Care Medicine

## 2017-01-25 ENCOUNTER — Other Ambulatory Visit: Payer: Self-pay | Admitting: Family Medicine

## 2017-01-25 ENCOUNTER — Encounter: Payer: Self-pay | Admitting: Critical Care Medicine

## 2017-01-25 VITALS — BP 115/70 | HR 81 | Temp 98.5°F | Wt 384.4 lb

## 2017-01-25 DIAGNOSIS — J4551 Severe persistent asthma with (acute) exacerbation: Secondary | ICD-10-CM | POA: Insufficient documentation

## 2017-01-25 DIAGNOSIS — J0111 Acute recurrent frontal sinusitis: Secondary | ICD-10-CM | POA: Insufficient documentation

## 2017-01-25 DIAGNOSIS — R490 Dysphonia: Secondary | ICD-10-CM | POA: Insufficient documentation

## 2017-01-25 DIAGNOSIS — J4541 Moderate persistent asthma with (acute) exacerbation: Secondary | ICD-10-CM

## 2017-01-25 DIAGNOSIS — J0110 Acute frontal sinusitis, unspecified: Secondary | ICD-10-CM

## 2017-01-25 DIAGNOSIS — R0789 Other chest pain: Secondary | ICD-10-CM | POA: Insufficient documentation

## 2017-01-25 DIAGNOSIS — K219 Gastro-esophageal reflux disease without esophagitis: Secondary | ICD-10-CM | POA: Insufficient documentation

## 2017-01-25 DIAGNOSIS — F329 Major depressive disorder, single episode, unspecified: Secondary | ICD-10-CM

## 2017-01-25 MED ORDER — PREDNISONE 10 MG PO TABS: ORAL_TABLET | ORAL | 0 refills | Status: DC

## 2017-01-25 MED ORDER — AEROCHAMBER MV MISC: 0 refills | Status: DC

## 2017-01-25 MED ORDER — FLUCONAZOLE 100 MG PO TABS: ORAL_TABLET | ORAL | 0 refills | Status: DC

## 2017-01-25 MED ORDER — AMOXICILLIN-POT CLAVULANATE 875-125 MG PO TABS: 1.0000 | ORAL_TABLET | Freq: Two times a day (BID) | ORAL | 0 refills | Status: DC

## 2017-01-25 MED FILL — ?BUPROPION HCL XL 300 MG TA: 300 | 30 days supply | Qty: 30 | Fill #0

## 2017-01-25 MED FILL — ?HYDROXYZINE HCL 25 MG TAB: 25 MG | 30 days supply | Qty: 15 | Fill #1

## 2017-01-25 MED FILL — ?PREDNISONE 10 MG TABLET: 10 | 16 days supply | Qty: 40 | Fill #0

## 2017-01-25 MED FILL — AMOX-CLAV 875-125 MG TABLET: 875-125 | 10 days supply | Qty: 20 | Fill #0

## 2017-01-25 MED FILL — ?FLUCONAZOLE 100 MG TABLET: 100 | 5 days supply | Qty: 6 | Fill #0

## 2017-01-25 NOTE — Assessment & Plan Note (Signed)
Moderate persistent asthma ppt by sinusitis, environmental, prob allergy, GERD, obesity,  I reviewed inhaler technique and is satisfactory, needs new spacer Plan Cont ICS inhaler double with symbicort  Prn saba Re pulse prednisone augmentin x 5 days Saline rinse Needs pulmonary referral for long term f/u at [facility]  GERD diet Cont flonase bid

## 2017-01-25 NOTE — Progress Notes (Signed)
Subjective:    Patient ID: Courtney Zamora, female    DOB: 20-Mar-1986, 31 y.o.   MRN: [id number]  31 y.o.F   Severe persistent asthma. Has never had allergy testing done  Now productive of thick green and some mucus. Hydroxizine put pt to sleep.      Asthma  She complains of chest tightness, cough, difficulty breathing, frequent throat clearing, hemoptysis, hoarse voice, shortness of breath and sputum production. This is a chronic problem. The current episode started more than 1 year ago. The problem occurs constantly. The problem has been unchanged. The cough is productive of purulent sputum, productive of blood-tinged sputum, productive, barking, croupy, paroxysmal, nocturnal and hoarse. Associated symptoms include chest pain, dyspnea on exertion, ear congestion, ear pain, headaches, heartburn, malaise/fatigue, nasal congestion, orthopnea, PND, postnasal drip, rhinorrhea, sneezing, a sore throat and trouble swallowing. Pertinent negatives include no fever. Associated symptoms comments: Food choices:  Bkft: cereal  Lunch sandwich  PB, dinner: varies:  Hotdogs etc. Her symptoms are aggravated by change in weather, exposure to fumes and pollen. Her symptoms are alleviated by beta-agonist and oral steroids. She reports minimal improvement on treatment. Her past medical history is significant for asthma.      Review of Systems  Constitutional: Positive for malaise/fatigue. Negative for fever.  HENT: Positive for ear pain, hoarse voice, postnasal drip, rhinorrhea, sneezing, sore throat and trouble swallowing.   Respiratory: Positive for cough, hemoptysis, sputum production and shortness of breath.   Cardiovascular: Positive for chest pain, dyspnea on exertion and PND.  Gastrointestinal: Positive for heartburn.  Neurological: Positive for headaches.       Objective:   Physical Exam Vitals:   01/25/17 1041  BP: 115/70  Pulse: 81  Temp: 98.5 F (36.9 C)  TempSrc: Oral  SpO2: 98%    Weight: (!) 384 lb 6.4 oz (174.4 kg)    Gen: Pleasant, well-nourished, in no distress,  normal affect  Obese   ENT: No lesions,  mouth clear,  oropharynx clear, ++ postnasal drip, R>L nare purulence and tubinate edema  Neck: No JVD, no TMG, no carotid bruits  Lungs: No use of accessory muscles, no dullness to percussion, clear without rales or rhonchi Upper airway sounds  Cardiovascular: RRR, heart sounds normal, no murmur or gallops, no peripheral edema  Abdomen: soft and NT, no HSM,  BS normal  Musculoskeletal: No deformities, no cyanosis or clubbing  Neuro: alert, non focal  Skin: Warm, no lesions or rashes  No results found.        Assessment & Plan:  I personally reviewed all images and lab data in the [facility] system as well as any outside material available during this office visit and agree with the  radiology impressions.   Asthma, moderate persistent Moderate persistent asthma ppt by sinusitis, environmental, prob allergy, GERD, obesity,  I reviewed inhaler technique and is satisfactory, needs new spacer Plan Cont ICS inhaler double with symbicort  Prn saba Re pulse prednisone augmentin x 5 days Saline rinse Needs pulmonary referral for long term f/u at [employer]  GERD diet Cont flonase bid   Acute frontal sinusitis Recurrent sinusitis Plan  pred pulse augmentin x 5days Saline rinse flonase  Esophageal reflux Recurrent GERD Plan GERD diet Cont PPI daily   Amberlie was seen today for asthma.  Diagnoses and all orders for this visit:  Moderate persistent asthma with acute exacerbation -     Ambulatory referral to Pulmonology  Acute recurrent frontal sinusitis  Gastroesophageal reflux disease  without esophagitis  Acute frontal sinusitis, recurrence not specified -     predniSONE (DELTASONE) 10 MG tablet; Take 4 for four days 3 for four days 2 for four days 1 for four days  Other orders -     amoxicillin-clavulanate (AUGMENTIN) 875-125 MG  tablet; Take 1 tablet by mouth 2 (two) times daily. -     Spacer/Aero-Holding Chambers (AEROCHAMBER MV) inhaler; Use as instructed -     fluconazole (DIFLUCAN) 100 MG tablet; Take two once then one daily until gone

## 2017-01-25 NOTE — Assessment & Plan Note (Signed)
Recurrent GERD Plan GERD diet Cont PPI daily

## 2017-01-25 NOTE — Patient Instructions (Addendum)
Resume prednisone 10mg  Take 4 for four days 3 for four days 2 for four days 1 for four days Take augmentin 875mg  twice daily for 10days Stay on Qvar and Symbicort, a new spacer will be obtained Stay on protonix Follow a reflux diet, see below Use albuterol as needed Stay on flonase and zyrtec Diflucan given for yeast A referral to [facility] will be made for asthma evaluation Return to Dr Prn, or if needed until can get into [place] [facility]  Food Choices for Gastroesophageal Reflux Disease, Adult When you have gastroesophageal reflux disease (GERD), the foods you eat and your eating habits are very important. Choosing the right foods can help ease your discomfort. What guidelines do I need to follow?  Choose fruits, vegetables, whole grains, and low-fat dairy products.  Choose low-fat meat, fish, and poultry.  Limit fats such as oils, salad dressings, butter, nuts, and avocado.  Keep a food diary. This helps you identify foods that cause symptoms.  Avoid foods that cause symptoms. These may be different for everyone.  Eat small meals often instead of 3 large meals a day.  Eat your meals slowly, in a place where you are relaxed.  Limit fried foods.  Cook foods using methods other than frying.  Avoid drinking alcohol.  Avoid drinking large amounts of liquids with your meals.  Avoid bending over or lying down until 2-3 hours after eating. What foods are not recommended? These are some foods and drinks that may make your symptoms worse: Vegetables Tomatoes. Tomato juice. Tomato and spaghetti sauce. Chili peppers. Onion and garlic. Horseradish. Fruits Oranges, grapefruit, and lemon (fruit and juice). Meats High-fat meats, fish, and poultry. This includes hot dogs, ribs, ham, sausage, salami, and bacon. Dairy Whole milk and chocolate milk. Sour cream. Cream. Butter. Ice cream. Cream cheese. Drinks Coffee and tea. Bubbly (carbonated) drinks or energy  drinks. Condiments Hot sauce. Barbecue sauce. Sweets/Desserts Chocolate and cocoa. Donuts. Peppermint and spearmint. Fats and Oils High-fat foods. This includes fries and potato chips. Other Vinegar. Strong spices. This includes black pepper, white pepper, red pepper, cayenne, curry powder, cloves, ginger, and chili powder. The items listed above may not be a complete list of foods and drinks to avoid. Contact your dietitian for more information. This information is not intended to replace advice given to you by your health care provider. Make sure you discuss any questions you have with your health care provider. Document Released: 01/03/2012 Document Revised: 12/10/2015 Document Reviewed: 05/08/2013 Elsevier Interactive Patient Education  2017 05/10/2013.

## 2017-01-25 NOTE — Assessment & Plan Note (Signed)
Recurrent sinusitis Plan  pred pulse augmentin x 5days Saline rinse flonase

## 2017-01-30 ENCOUNTER — Other Ambulatory Visit: Payer: Self-pay | Admitting: Internal Medicine

## 2017-01-30 ENCOUNTER — Encounter: Payer: Self-pay | Admitting: Internal Medicine

## 2017-01-30 MED ORDER — FUROSEMIDE 20 MG PO TABS: 20.0000 mg | ORAL_TABLET | Freq: Every day | ORAL | 0 refills | Status: DC | PRN

## 2017-02-06 ENCOUNTER — Other Ambulatory Visit: Payer: Self-pay

## 2017-02-06 MED ORDER — PANTOPRAZOLE SODIUM 40 MG PO TBEC: 40.0000 mg | DELAYED_RELEASE_TABLET | Freq: Every day | ORAL | 3 refills | Status: DC

## 2017-02-06 MED FILL — ?CETIRIZINE HCL 10 MG TABLE: 10 | 30 days supply | Qty: 30 | Fill #2

## 2017-02-06 MED FILL — ACETAMINOPHEN/COD #3 TABLET: 300-30 | 30 days supply | Qty: 120 | Fill #1

## 2017-02-06 MED FILL — ?PANTOPRAZOLE SOD DR 40MG: 40 MG | 30 days supply | Qty: 30 | Fill #0

## 2017-02-06 MED FILL — tiZANidine HCL 4 MG TABS: 4 | 9 days supply | Qty: 40 | Fill #4

## 2017-02-06 MED FILL — ?DICLOFENAC SOD DR 75 MG TA: 75 | 30 days supply | Qty: 60 | Fill #4

## 2017-02-06 MED FILL — PROMETHAZINE 25 MG TABLET: 25 | 30 days supply | Qty: 90 | Fill #2

## 2017-02-06 MED FILL — GABAPENTIN 300 MG CAPSULE: 300 | 30 days supply | Qty: 90 | Fill #1

## 2017-02-08 MED FILL — ?FUROSEMIDE 20 MG TABLET: 20 | 12 days supply | Qty: 12 | Fill #0

## 2017-02-09 ENCOUNTER — Encounter: Payer: Self-pay | Admitting: Internal Medicine

## 2017-02-09 ENCOUNTER — Ambulatory Visit: Payer: Self-pay | Attending: Internal Medicine | Admitting: Internal Medicine

## 2017-02-09 VITALS — BP 163/99 | HR 111 | Temp 98.1°F | Resp 16 | Wt 382.8 lb

## 2017-02-09 DIAGNOSIS — F419 Anxiety disorder, unspecified: Secondary | ICD-10-CM

## 2017-02-09 DIAGNOSIS — Z888 Allergy status to other drugs, medicaments and biological substances status: Secondary | ICD-10-CM | POA: Insufficient documentation

## 2017-02-09 DIAGNOSIS — M25572 Pain in left ankle and joints of left foot: Secondary | ICD-10-CM | POA: Insufficient documentation

## 2017-02-09 DIAGNOSIS — Z9889 Other specified postprocedural states: Secondary | ICD-10-CM | POA: Insufficient documentation

## 2017-02-09 DIAGNOSIS — R55 Syncope and collapse: Secondary | ICD-10-CM | POA: Insufficient documentation

## 2017-02-09 DIAGNOSIS — L600 Ingrowing nail: Secondary | ICD-10-CM

## 2017-02-09 DIAGNOSIS — Z800 Family history of malignant neoplasm of digestive organs: Secondary | ICD-10-CM | POA: Insufficient documentation

## 2017-02-09 DIAGNOSIS — Z79899 Other long term (current) drug therapy: Secondary | ICD-10-CM | POA: Insufficient documentation

## 2017-02-09 DIAGNOSIS — J0110 Acute frontal sinusitis, unspecified: Secondary | ICD-10-CM | POA: Insufficient documentation

## 2017-02-09 DIAGNOSIS — F0781 Postconcussional syndrome: Secondary | ICD-10-CM | POA: Insufficient documentation

## 2017-02-09 DIAGNOSIS — Z833 Family history of diabetes mellitus: Secondary | ICD-10-CM | POA: Insufficient documentation

## 2017-02-09 DIAGNOSIS — F329 Major depressive disorder, single episode, unspecified: Secondary | ICD-10-CM | POA: Insufficient documentation

## 2017-02-09 DIAGNOSIS — K219 Gastro-esophageal reflux disease without esophagitis: Secondary | ICD-10-CM | POA: Insufficient documentation

## 2017-02-09 DIAGNOSIS — M79672 Pain in left foot: Secondary | ICD-10-CM | POA: Insufficient documentation

## 2017-02-09 DIAGNOSIS — M545 Low back pain: Secondary | ICD-10-CM

## 2017-02-09 DIAGNOSIS — M62838 Other muscle spasm: Secondary | ICD-10-CM | POA: Insufficient documentation

## 2017-02-09 DIAGNOSIS — N6452 Nipple discharge: Secondary | ICD-10-CM | POA: Insufficient documentation

## 2017-02-09 DIAGNOSIS — Z8249 Family history of ischemic heart disease and other diseases of the circulatory system: Secondary | ICD-10-CM | POA: Insufficient documentation

## 2017-02-09 DIAGNOSIS — Z7952 Long term (current) use of systemic steroids: Secondary | ICD-10-CM | POA: Insufficient documentation

## 2017-02-09 DIAGNOSIS — G8929 Other chronic pain: Secondary | ICD-10-CM

## 2017-02-09 DIAGNOSIS — M25561 Pain in right knee: Secondary | ICD-10-CM | POA: Insufficient documentation

## 2017-02-09 DIAGNOSIS — I10 Essential (primary) hypertension: Secondary | ICD-10-CM

## 2017-02-09 DIAGNOSIS — M25552 Pain in left hip: Secondary | ICD-10-CM | POA: Insufficient documentation

## 2017-02-09 DIAGNOSIS — J4540 Moderate persistent asthma, uncomplicated: Secondary | ICD-10-CM | POA: Insufficient documentation

## 2017-02-09 MED ORDER — CEPHALEXIN 500 MG PO CAPS: 500.0000 mg | ORAL_CAPSULE | Freq: Four times a day (QID) | ORAL | 0 refills | Status: DC

## 2017-02-09 MED ORDER — ESCITALOPRAM OXALATE 10 MG PO TABS: 10.0000 mg | ORAL_TABLET | Freq: Every day | ORAL | 6 refills | Status: DC

## 2017-02-09 MED FILL — CEPHALEXIN 500 MG CAPSULE: 500 | 7 days supply | Qty: 28 | Fill #0

## 2017-02-09 MED FILL — ESCITALOPRAM 10 MG TABLET: 10 | 30 days supply | Qty: 30 | Fill #0

## 2017-02-09 NOTE — Patient Instructions (Addendum)
Please give appointment with Courtney Zamora. She has already submitted completed [employer] card application.   Start Lexapro daily to help with anxiety and depression.     Back Exercises If you have pain in your back, do these exercises 2-3 times each day or as told by your doctor. When the pain goes away, do the exercises once each day, but repeat the steps more times for each exercise (do more repetitions). If you do not have pain in your back, do these exercises once each day or as told by your doctor. Exercises Single Knee to Chest  Do these steps 3-5 times in a row for each leg: 1. Lie on your back on a firm bed or the floor with your legs stretched out. 2. Bring one knee to your chest. 3. Hold your knee to your chest by grabbing your knee or thigh. 4. Pull on your knee until you feel a gentle stretch in your lower back. 5. Keep doing the stretch for 10-30 seconds. 6. Slowly let go of your leg and straighten it.  Pelvic Tilt  Do these steps 5-10 times in a row: 1. Lie on your back on a firm bed or the floor with your legs stretched out. 2. Bend your knees so they point up to the ceiling. Your feet should be flat on the floor. 3. Tighten your lower belly (abdomen) muscles to press your lower back against the floor. This will make your tailbone point up to the ceiling instead of pointing down to your feet or the floor. 4. Stay in this position for 5-10 seconds while you gently tighten your muscles and breathe evenly.  Cat-Cow  Do these steps until your lower back bends more easily: 1. Get on your hands and knees on a firm surface. Keep your hands under your shoulders, and keep your knees under your hips. You may put padding under your knees. 2. Let your head hang down, and make your tailbone point down to the floor so your lower back is round like the back of a cat. 3. Stay in this position for 5 seconds. 4. Slowly lift your head and make your tailbone point up to the ceiling so your back  hangs low (sags) like the back of a cow. 5. Stay in this position for 5 seconds.  Press-Ups  Do these steps 5-10 times in a row: 1. Lie on your belly (face-down) on the floor. 2. Place your hands near your head, about shoulder-width apart. 3. While you keep your back relaxed and keep your hips on the floor, slowly straighten your arms to raise the top half of your body and lift your shoulders. Do not use your back muscles. To make yourself more comfortable, you may change where you place your hands. 4. Stay in this position for 5 seconds. 5. Slowly return to lying flat on the floor.  Bridges  Do these steps 10 times in a row: 1. Lie on your back on a firm surface. 2. Bend your knees so they point up to the ceiling. Your feet should be flat on the floor. 3. Tighten your butt muscles and lift your butt off of the floor until your waist is almost as high as your knees. If you do not feel the muscles working in your butt and the back of your thighs, slide your feet 1-2 inches farther away from your butt. 4. Stay in this position for 3-5 seconds. 5. Slowly lower your butt to the floor, and let your  butt muscles relax.  If this exercise is too easy, try doing it with your arms crossed over your chest. Belly Crunches  Do these steps 5-10 times in a row: 1. Lie on your back on a firm bed or the floor with your legs stretched out. 2. Bend your knees so they point up to the ceiling. Your feet should be flat on the floor. 3. Cross your arms over your chest. 4. Tip your chin a little bit toward your chest but do not bend your neck. 5. Tighten your belly muscles and slowly raise your chest just enough to lift your shoulder blades a tiny bit off of the floor. 6. Slowly lower your chest and your head to the floor.  Back Lifts Do these steps 5-10 times in a row: 1. Lie on your belly (face-down) with your arms at your sides, and rest your forehead on the floor. 2. Tighten the muscles in your legs  and your butt. 3. Slowly lift your chest off of the floor while you keep your hips on the floor. Keep the back of your head in line with the curve in your back. Look at the floor while you do this. 4. Stay in this position for 3-5 seconds. 5. Slowly lower your chest and your face to the floor.  Contact a doctor if:  Your back pain gets a lot worse when you do an exercise.  Your back pain does not lessen 2 hours after you exercise. If you have any of these problems, stop doing the exercises. Do not do them again unless your doctor says it is okay. Get help right away if:  You have sudden, very bad back pain. If this happens, stop doing the exercises. Do not do them again unless your doctor says it is okay. This information is not intended to replace advice given to you by your health care provider. Make sure you discuss any questions you have with your health care provider. Document Released: 08/06/2010 Document Revised: 12/10/2015 Document Reviewed: 08/28/2014 Elsevier Interactive Patient Education  [employer].

## 2017-02-09 NOTE — Progress Notes (Signed)
PT states she has a toe that is infected Pt states she had an asthma attack this morning

## 2017-02-09 NOTE — Progress Notes (Signed)
  Patient ID: Courtney Zamora, female    DOB: 02/23/1986  MRN: [id number]  CC: Sinus Problem and Ingrown Toenail   Subjective: Courtney Zamora is a 31 y.o. female who presents for f/u visit.  Pt was late for appt. Her concerns today include:  Patient with history of moderate persistent asthma, morbid obesity, hypertension, depression, chronic knee pain due to on meniscus tear and osteoarthritis based on MRI done 04/2016, on pain management agreement for Tylenol #3. Saw Pulmonologist Dr. Wright since last visit and treated for recurrent sinusitis.  Referred to  pulmonary for asthma.  -No appointment as yet. She is waiting for disability hearing and hoping to get Medicaid. -Has not met with financial specialist as yet even though she has completed and submitted form for the [employer] card   1. C/o infection RT big toe x 3 days. -hit it 3 days ago and is a little sore.  -thick drainage expressed. +redness.  Cut nails about 2 wks ago.  2. Asthma attack this a.m -Attributes flare to inhaling cinnamon smelling perfume earlier this week -did treatment and now okay. Compliant with inhalers.  She has Qvar and Symbicort on her med list and I asked for clarification on that. She states that during bad season the pulmonologist had her taking both Symbicort and Qvar but now she is just on the Symbicort   3.HTN Compliant with medication and salt restriction  4. Obesity:  Swimming with kids 3 x a wk.  Still trying to restrict calories as much as possible    5. C/o Pain in back that spreads to both hips. X 2 mths -worse with a lot of walking -better with sitting.  Not using heating pads.  7. Depression/Anxiety -increase stressor recently is trying to find new vehicle -Does not feel her symptoms are under good control with Wellbutrin Patient Active Problem List   Diagnosis Date Noted  . Acute frontal sinusitis 12/15/2016  . Metatarsal stress fracture, right, sequela 11/17/2016  . Post  concussion syndrome 10/25/2016  . Syncope 10/05/2016  . Neck muscle spasm 10/05/2016  . Nipple discharge in female 09/13/2016  . Depression 08/17/2016  . Anxiety 08/17/2016  . ASCUS with positive high risk HPV cervical 08/14/2016  . Left foot pain 08/14/2016  . Hypertension 08/14/2016  . Long term (current) use of systemic steroids 08/14/2016  . Weight gain 01/12/2016  . Esophageal reflux 12/29/2015  . Morbid obesity (HCC) 12/29/2015  . Chronic pain of right knee 12/29/2015  . Chronic pain of left ankle 12/29/2015  . 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 Asthma, moderate persistent 12/09/2015     Current Outpatient Prescriptions on File Prior to Visit  Medication Sig Dispense Refill  . acetaminophen-codeine (TYLENOL #3) 300-30 MG tablet Take 1 tablet by mouth every 6 (six) hours as needed for moderate pain. 120 tablet 2  . albuterol (PROVENTIL HFA;VENTOLIN HFA) 108 (90 Base) MCG/ACT inhaler Inhale 2 puffs into the lungs every 6 (six) hours as needed for wheezing or shortness of breath (wheezing). For shortness of breath. 54 Inhaler 3  . albuterol (PROVENTIL) (2.5 MG/3ML) 0.083% nebulizer solution Take 3 mLs (2.5 mg total) by nebulization every 6 (six) hours as needed for wheezing or shortness of breath. 150 mL 1  . budesonide-formoterol (SYMBICORT) 160-4.5 MCG/ACT inhaler Inhale 2 puffs into the lungs 2 (two) times daily. 3 Inhaler 3  . buPROPion (WELLBUTRIN XL) 300 MG 24 hr tablet TAKE 1 TABLET BY MOUTH DAILY. 30 tablet 2  . Calcium Citrate   200 MG TABS Take 2 tablets (400 mg total) by mouth every other day.    . cetirizine (ZYRTEC) 10 MG tablet Take 1 tablet (10 mg total) by mouth daily. 30 tablet 11  . Cholecalciferol (VITAMIN D3) 5000 units CAPS Take 5,000 Units by mouth once a week.    . diclofenac (VOLTAREN) 75 MG EC tablet Take 75 mg by mouth 2 (two) times daily.  1  . fluconazole (DIFLUCAN) 100 MG tablet Take two once then one daily until gone 6 tablet 0  . fluticasone (FLONASE) 50  MCG/ACT nasal spray Place 2 sprays into both nostrils 2 (two) times daily. 16 g 6  . furosemide (LASIX) 20 MG tablet Take 1 tablet (20 mg total) by mouth daily as needed. 12 tablet 0  . gabapentin (NEURONTIN) 300 MG capsule Take 1 capsule (300 mg total) by mouth 3 (three) times daily. 90 capsule 4  . hydrochlorothiazide (HYDRODIURIL) 12.5 MG tablet Take 1 tablet (12.5 mg total) by mouth daily. 90 tablet 3  . Misc. Devices (CRUTCHES-ALUMINUM) MISC 1 each by Does not apply route daily. (Patient not taking: Reported on 01/25/2017) 2 each 0  . pantoprazole (PROTONIX) 40 MG tablet Take 1 tablet (40 mg total) by mouth daily. 30 tablet 3  . predniSONE (DELTASONE) 10 MG tablet Take 4 for four days 3 for four days 2 for four days 1 for four days 40 tablet 0  . Prenatal Vit-Fe Fumarate-FA (PRENATAL MULTIVITAMIN) TABS tablet Take 1 tablet by mouth daily at 12 noon.    . promethazine (PHENERGAN) 25 MG tablet Take 1 tablet (25 mg total) by mouth every 8 (eight) hours as needed for nausea or vomiting. 2060 tablet 2  . Spacer/Aero-Holding Chambers (AEROCHAMBER MV) inhaler Use as instructed 1 each 0  . tiZANidine (ZANAFLEX) 4 MG tablet TAKE 1 TABLET BY MOUTH EVERY 6 HOURS AS NEEDED FOR MUSCLE SPASMS. (Patient taking differently: Take 4 mg by mouth every 6 (six) hours as needed for muscle spasms. ) 120 tablet 3   No current facility-administered medications on file prior to visit.     Allergies  Allergen Reactions  . Bee Venom Anaphylaxis  . Cinnamon Anaphylaxis  . Ibuprofen Nausea Only  . Tape Hives and Rash    EKG STICKERS give pt rash, hives    Social History   Social History  . Marital status: Married    Spouse name: N/A  . Number of children: N/A  . Years of education: N/A   Occupational History  . Not on file.   Social History Main Topics  . Smoking status: Never Smoker  . Smokeless tobacco: Never Used  . Alcohol use No  . Drug use: No  . Sexual activity: Yes    Birth control/  protection: None   Other Topics Concern  . Not on file   Social History Narrative  . No narrative on file    Family History  Problem Relation Age of Onset  . Hypothyroidism Mother   . Diabetes Father   . Diabetes Paternal Uncle   . Hypertension Maternal Grandmother   . Hypertension Maternal Grandfather   . Pancreatic cancer Maternal Grandfather   . Hypertension Paternal Grandmother   . Heart disease Paternal Grandfather   . Hypertension Paternal Grandfather     Past Surgical History:  Procedure Laterality Date  . TONSILLECTOMY      ROS: Review of Systems Negative except as stated above PHYSICAL EXAM: BP (!) 163/99   Pulse (!) 111     Temp 98.1 F (36.7 C) (Oral)   Resp 16   Wt (!) 382 lb 12.8 oz (173.6 kg)   SpO2 99%   BMI 65.71 kg/m   Wt Readings from Last 3 Encounters:  02/09/17 (!) 382 lb 12.8 oz (173.6 kg)  01/25/17 (!) 384 lb 6.4 oz (174.4 kg)  01/05/17 (!) 375 lb 3.2 oz (170.2 kg)    Physical Exam  General appearance - alert, well appearing, obese Caucasian female and in no distress Mental status - alert, oriented to person, place, and time, normal mood, behavior, speech, dress, motor activity, and thought processes Neck - supple, no significant adenopathy Chest - clear to auscultation, no wheezes, rales or rhonchi, symmetric air entry Heart - normal rate, regular rhythm, normal S1, S2, no murmurs, rubs, clicks or gallops Extremities - peripheral pulses normal, no pedal edema, no clubbing or cyanosis Skin -right big toe: Mild erythema around medial aspect and tip. No pus expressed at this time. Toenail is ingrown. MSK: Mild tenderness on palpation of lumbar spine. Straight leg raise negative.  Depression screen [facility] 2/9 02/09/2017 01/05/2017 12/13/2016 12/07/2016 11/17/2016  Decreased Interest _0 Down, Depressed, Hopeless _1 0 1  PHQ - 2 Score _2 Altered sleeping _3 Tired, decreased energy _4 Change in appetite _5 Feeling bad or failure about yourself  1 0 0 0 0  Trouble concentrating _6 (No Data) 2  Moving slowly or fidgety/restless 2 0 1 0 2  Suicidal thoughts 0 0 0 0 0  PHQ-9 Score _7 Some recent data might be hidden   GAD 7 : Generalized Anxiety Score 02/09/2017 01/05/2017 12/13/2016 12/07/2016  Nervous, Anxious, on Edge _8 Control/stop worrying _9 Worry too much - different things _10 0  Trouble relaxing _11 Restless _12 Easily annoyed or irritable _13 Afraid - awful might happen 0 0 0 0  Total GAD 7 Score _14 ASSESSMENT AND PLAN: 1. Ingrown nail of great toe of right foot -Recommend podiatry referral. Patient declined at this time - cephALEXin (KEFLEX) 500 MG capsule; Take 1 capsule (500 mg total) by mouth 4 (four) times daily.  Dispense: 28 capsule; Refill: 0  2. Essential hypertension Repeat blood pressure at goal  3. Chronic bilateral low back pain without sciatica -Most likely DJD or DDD. -Given printed information about stretching exercises for chronic back pain -Continue use of current pain meds  4. Morbid obesity ([place]) -Commended her on getting out of the pool 3 times a week and encouraged her to continue doing that. -Continue to encourage and healthy eating habits - 5. Anxiety and depression -Patient willing to try Lexapro will bring her back in several weeks to see how she is doing - escitalopram (LEXAPRO) 10 MG tablet; Take 1 tablet (10 mg total) by mouth daily.  Dispense: 30 tablet; Refill: 6  Appointment to be made for her to see our financial specialist Patient was given the opportunity to ask questions.  Patient verbalized understanding of the plan and was able to repeat key elements of the plan.   No orders of the defined types were placed in this encounter.    Requested Prescriptions  Signed Prescriptions Disp Refills  . escitalopram (LEXAPRO) 10 MG tablet 30 tablet 6    Sig: Take 1 tablet (10 mg  total) by mouth daily.  . cephALEXin (KEFLEX) 500 MG capsule 28 capsule 0    Sig: Take 1 capsule (500 mg total) by mouth 4 (four) times daily.    Return in about 6 weeks (around 03/23/2017).  Deborah Johnson, MD, FACP 

## 2017-02-17 ENCOUNTER — Other Ambulatory Visit: Payer: Self-pay | Admitting: Critical Care Medicine

## 2017-02-17 DIAGNOSIS — J0110 Acute frontal sinusitis, unspecified: Secondary | ICD-10-CM

## 2017-02-17 NOTE — Telephone Encounter (Signed)
Patient refill for prednisone request.

## 2017-02-18 ENCOUNTER — Other Ambulatory Visit: Payer: Self-pay | Admitting: Internal Medicine

## 2017-02-18 DIAGNOSIS — J0110 Acute frontal sinusitis, unspecified: Secondary | ICD-10-CM

## 2017-02-18 MED ORDER — PREDNISONE 10 MG PO TABS: ORAL_TABLET | ORAL | 0 refills | Status: DC

## 2017-02-23 MED FILL — ?BUPROPION HCL XL 300 MG TA: 300 | 30 days supply | Qty: 30 | Fill #1

## 2017-02-23 MED FILL — ?HYDROCHLOROTHIAZIDE 12.5MG: 12.5 | 30 days supply | Qty: 30 | Fill #6

## 2017-03-02 ENCOUNTER — Encounter: Payer: Self-pay | Admitting: Internal Medicine

## 2017-03-07 ENCOUNTER — Ambulatory Visit: Payer: Self-pay | Admitting: Internal Medicine

## 2017-03-07 MED FILL — ESCITALOPRAM 10 MG TABLET: 10 | 30 days supply | Qty: 30 | Fill #1

## 2017-03-07 MED FILL — ?PANTOPRAZOLE SOD DR 40MG: 40 MG | 30 days supply | Qty: 30 | Fill #1

## 2017-03-07 MED FILL — GABAPENTIN 300 MG CAPSULE: 300 | 30 days supply | Qty: 90 | Fill #2

## 2017-03-07 MED FILL — ?DICLOFENAC SOD DR 75 MG TA: 75 | 30 days supply | Qty: 60 | Fill #5

## 2017-03-07 MED FILL — ?CETIRIZINE HCL 10 MG TABLE: 10 | 30 days supply | Qty: 30 | Fill #3

## 2017-03-07 MED FILL — ACETAMINOPHEN/COD #3 TABLET: 300-30 | 30 days supply | Qty: 120 | Fill #2

## 2017-03-09 ENCOUNTER — Other Ambulatory Visit: Payer: Self-pay | Admitting: Pharmacist

## 2017-03-09 DIAGNOSIS — M25561 Pain in right knee: Principal | ICD-10-CM

## 2017-03-09 DIAGNOSIS — M25572 Pain in left ankle and joints of left foot: Secondary | ICD-10-CM

## 2017-03-09 DIAGNOSIS — G8929 Other chronic pain: Secondary | ICD-10-CM

## 2017-03-09 MED ORDER — TIZANIDINE HCL 4 MG PO TABS: ORAL_TABLET | ORAL | 0 refills | Status: DC

## 2017-03-09 MED FILL — tiZANidine HCL 4 MG TABS: 4 | 30 days supply | Qty: 120 | Fill #0

## 2017-03-11 ENCOUNTER — Other Ambulatory Visit: Payer: Self-pay | Admitting: Internal Medicine

## 2017-03-11 DIAGNOSIS — J0110 Acute frontal sinusitis, unspecified: Secondary | ICD-10-CM

## 2017-03-12 ENCOUNTER — Other Ambulatory Visit: Payer: Self-pay | Admitting: Internal Medicine

## 2017-03-12 DIAGNOSIS — J0110 Acute frontal sinusitis, unspecified: Secondary | ICD-10-CM

## 2017-03-12 MED ORDER — PREDNISONE 10 MG PO TABS: ORAL_TABLET | ORAL | 0 refills | Status: DC

## 2017-03-17 ENCOUNTER — Encounter: Payer: Self-pay | Admitting: Internal Medicine

## 2017-03-17 ENCOUNTER — Ambulatory Visit: Payer: Self-pay | Attending: Internal Medicine | Admitting: Internal Medicine

## 2017-03-17 VITALS — BP 139/80 | HR 81 | Temp 98.2°F | Resp 16 | Wt 389.6 lb

## 2017-03-17 DIAGNOSIS — J4540 Moderate persistent asthma, uncomplicated: Secondary | ICD-10-CM | POA: Insufficient documentation

## 2017-03-17 DIAGNOSIS — R569 Unspecified convulsions: Secondary | ICD-10-CM | POA: Insufficient documentation

## 2017-03-17 DIAGNOSIS — Z800 Family history of malignant neoplasm of digestive organs: Secondary | ICD-10-CM | POA: Insufficient documentation

## 2017-03-17 DIAGNOSIS — G8929 Other chronic pain: Secondary | ICD-10-CM | POA: Insufficient documentation

## 2017-03-17 DIAGNOSIS — N6452 Nipple discharge: Secondary | ICD-10-CM | POA: Insufficient documentation

## 2017-03-17 DIAGNOSIS — Z91030 Bee allergy status: Secondary | ICD-10-CM | POA: Insufficient documentation

## 2017-03-17 DIAGNOSIS — M62838 Other muscle spasm: Secondary | ICD-10-CM | POA: Insufficient documentation

## 2017-03-17 DIAGNOSIS — J0110 Acute frontal sinusitis, unspecified: Secondary | ICD-10-CM | POA: Insufficient documentation

## 2017-03-17 DIAGNOSIS — Z9889 Other specified postprocedural states: Secondary | ICD-10-CM | POA: Insufficient documentation

## 2017-03-17 DIAGNOSIS — Z7952 Long term (current) use of systemic steroids: Secondary | ICD-10-CM | POA: Insufficient documentation

## 2017-03-17 DIAGNOSIS — I10 Essential (primary) hypertension: Secondary | ICD-10-CM | POA: Insufficient documentation

## 2017-03-17 DIAGNOSIS — F419 Anxiety disorder, unspecified: Secondary | ICD-10-CM | POA: Insufficient documentation

## 2017-03-17 DIAGNOSIS — Z888 Allergy status to other drugs, medicaments and biological substances status: Secondary | ICD-10-CM | POA: Insufficient documentation

## 2017-03-17 DIAGNOSIS — Z7951 Long term (current) use of inhaled steroids: Secondary | ICD-10-CM | POA: Insufficient documentation

## 2017-03-17 DIAGNOSIS — F329 Major depressive disorder, single episode, unspecified: Secondary | ICD-10-CM | POA: Insufficient documentation

## 2017-03-17 DIAGNOSIS — Z833 Family history of diabetes mellitus: Secondary | ICD-10-CM | POA: Insufficient documentation

## 2017-03-17 DIAGNOSIS — Z79899 Other long term (current) drug therapy: Secondary | ICD-10-CM | POA: Insufficient documentation

## 2017-03-17 DIAGNOSIS — M25572 Pain in left ankle and joints of left foot: Secondary | ICD-10-CM | POA: Insufficient documentation

## 2017-03-17 DIAGNOSIS — K219 Gastro-esophageal reflux disease without esophagitis: Secondary | ICD-10-CM | POA: Insufficient documentation

## 2017-03-17 DIAGNOSIS — M25561 Pain in right knee: Secondary | ICD-10-CM | POA: Insufficient documentation

## 2017-03-17 DIAGNOSIS — Z8249 Family history of ischemic heart disease and other diseases of the circulatory system: Secondary | ICD-10-CM | POA: Insufficient documentation

## 2017-03-17 MED ORDER — BUPROPION HCL ER (XL) 150 MG PO TB24: 150.0000 mg | ORAL_TABLET | Freq: Every day | ORAL | 0 refills | Status: DC

## 2017-03-17 NOTE — Patient Instructions (Signed)
Stop Lexapro. Decrease Wellbutrin to 150 mg daily for 3 weeks then stop it.

## 2017-03-17 NOTE — Progress Notes (Signed)
Patient ID: [name], female    DOB: 1985/10/15  MRN: [id number]  CC: Follow-up   Subjective: Courtney Zamora is a 31 y.o. female who presents for 1 mth f/u on anxiety/depression.  Her concerns today include:  Patient with history of moderate persistent asthma, morbid obesity, hypertension, depression, chronic knee pain due to on meniscus tear and osteoarthritis based on MRI done 04/2016, on pain management agreement for Tylenol #3.  1. Dep/Anxiety:  -started on Lexapro on last visit Taking it and doing ok in terms of depression. However, husband noted some nights she has shaking when she falls off to sleep. He wakes her up. 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 Only had sz when she got really sick or when she was stuck with a needle which is why she is resistant to blood draws and injections .  Does not recall type of sz she was dx with.  Never placed on med.  -she read one of the S.E of Lexapro and Wellbutrin is that they can cause sz.  She thinks the 2 in combo may be causing the problem  2. Bug bite on RT arm recently. Not sure if spider bite Patient Active Problem List   Diagnosis Date Noted  . Acute frontal sinusitis 12/15/2016  . Metatarsal stress fracture, right, sequela 11/17/2016  . Neck muscle spasm 10/05/2016  . Nipple discharge in female 09/13/2016  . Depression 08/17/2016  . Anxiety 08/17/2016  . ASCUS with positive high risk HPV cervical 08/14/2016  . Hypertension 08/14/2016  . Long term (current) use of systemic steroids 08/14/2016  . Esophageal reflux 12/29/2015  . Morbid obesity (HCC) 12/29/2015  . Chronic pain of right knee 12/29/2015  . Chronic pain of left ankle 12/29/2015  . 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 Asthma, moderate persistent 12/09/2015     Current Outpatient Prescriptions on File Prior to Visit  Medication Sig Dispense Refill  . acetaminophen-codeine (TYLENOL #3) 300-30 MG tablet Take 1 tablet by mouth every 6 (six)  hours as needed for moderate pain. 120 tablet 2  . albuterol (PROVENTIL HFA;VENTOLIN HFA) 108 (90 Base) MCG/ACT inhaler Inhale 2 puffs into the lungs every 6 (six) hours as needed for wheezing or shortness of breath (wheezing). For shortness of breath. 54 Inhaler 3  . albuterol (PROVENTIL) (2.5 MG/3ML) 0.083% nebulizer solution Take 3 mLs (2.5 mg total) by nebulization every 6 (six) hours as needed for wheezing or shortness of breath. 150 mL 1  . budesonide-formoterol (SYMBICORT) 160-4.5 MCG/ACT inhaler Inhale 2 puffs into the lungs 2 (two) times daily. 3 Inhaler 3  . Calcium Citrate 200 MG TABS Take 2 tablets (400 mg total) by mouth every other day.    . cephALEXin (KEFLEX) 500 MG capsule Take 1 capsule (500 mg total) by mouth 4 (four) times daily. 28 capsule 0  . cetirizine (ZYRTEC) 10 MG tablet Take 1 tablet (10 mg total) by mouth daily. 30 tablet 11  . Cholecalciferol (VITAMIN D3) 5000 units CAPS Take 5,000 Units by mouth once a week.    . diclofenac (VOLTAREN) 75 MG EC tablet Take 75 mg by mouth 2 (two) times daily.  1  . fluconazole (DIFLUCAN) 100 MG tablet Take two once then one daily until gone 6 tablet 0  . fluticasone (FLONASE) 50 MCG/ACT nasal spray Place 2 sprays into both nostrils 2 (two) times daily. 16 g 6  . furosemide (LASIX) 20 MG tablet Take 1 tablet (20 mg  total) by mouth daily as needed. 12 tablet 0  . gabapentin (NEURONTIN) 300 MG capsule Take 1 capsule (300 mg total) by mouth 3 (three) times daily. 90 capsule 4  . hydrochlorothiazide (HYDRODIURIL) 12.5 MG tablet Take 1 tablet (12.5 mg total) by mouth daily. 90 tablet 3  . Misc. Devices (CRUTCHES-ALUMINUM) MISC 1 each by Does not apply route daily. (Patient not taking: Reported on 01/25/2017) 2 each 0  . pantoprazole (PROTONIX) 40 MG tablet Take 1 tablet (40 mg total) by mouth daily. 30 tablet 3  . predniSONE (DELTASONE) 10 MG tablet Take 4 for four days 3 for four days 2 for four days 1 for four days 40 tablet 0  . Prenatal  Vit-Fe Fumarate-FA (PRENATAL MULTIVITAMIN) TABS tablet Take 1 tablet by mouth daily at 12 noon.    . promethazine (PHENERGAN) 25 MG tablet Take 1 tablet (25 mg total) by mouth every 8 (eight) hours as needed for nausea or vomiting. 2060 tablet 2  . Spacer/Aero-Holding Chambers (AEROCHAMBER MV) inhaler Use as instructed 1 each 0  . tiZANidine (ZANAFLEX) 4 MG tablet TAKE 1 TABLET BY MOUTH EVERY 6 HOURS AS NEEDED FOR MUSCLE SPASMS. 120 tablet 0   No current facility-administered medications on file prior to visit.     Allergies  Allergen Reactions  . Bee Venom Anaphylaxis  . Cinnamon Anaphylaxis  . Ibuprofen Nausea Only  . Tape Hives and Rash    EKG STICKERS give pt rash, hives    Social History   Social History  . Marital status: Married    Spouse name: N/A  . Number of children: N/A  . Years of education: N/A   Occupational History  . Not on file.   Social History Main Topics  . Smoking status: Never Smoker  . Smokeless tobacco: Never Used  . Alcohol use No  . Drug use: No  . Sexual activity: Yes    Birth control/ protection: None   Other Topics Concern  . Not on file   Social History Narrative  . No narrative on file    Family History  Problem Relation Age of Onset  . Hypothyroidism Mother   . Diabetes Father   . Diabetes Paternal Uncle   . Hypertension Maternal Grandmother   . Hypertension Maternal Grandfather   . Pancreatic cancer Maternal Grandfather   . Hypertension Paternal Grandmother   . Heart disease Paternal Grandfather   . Hypertension Paternal Grandfather     Past Surgical History:  Procedure Laterality Date  . TONSILLECTOMY      ROS: Review of Systems Neg except as above PHYSICAL EXAM: BP 139/80   Pulse 81   Temp 98.2 F (36.8 C) (Oral)   Resp 16   Wt (!) 389 lb 9.6 oz (176.7 kg)   SpO2 97%   BMI 66.87 kg/m   Wt Readings from Last 3 Encounters:  03/17/17 (!) 389 lb 9.6 oz (176.7 kg)  02/09/17 (!) 382 lb 12.8 oz (173.6 kg)    01/25/17 (!) 384 lb 6.4 oz (174.4 kg)   Physical Exam General appearance - alert, well appearing, and in no distress Mental status - alert, oriented to person, place, and time, normal mood, behavior, speech, dress, motor activity, and thought processes Skin - small (less than 1 cm) circular scabbed over spot RT forearm    ASSESSMENT AND PLAN: 1. Anxiety and depression -Given concern for possible seizure (though hx sounds very atypical in that in past, sz only occurred with needle sticks or acute  illness) and as side effect, patient advised to discontinue Lexapro. We will also taper off Wellbutrin.  I have looked at several other options including Zoloft, Paxil, Elavil and they too can cause seizures.  -Patient given information for mental health services available in [place] including [facility]. Advised her to establish care there with the psychiatrist to help with choice of safe agent for her to take. -I also recommended referral to neurology. The patient wants to hold off at this time. Wants to see if she has any further episodes when she stops the Lexapro and Wellbutrin. If she does she will let me know.  2. Advised use of triple abx ointment to area of concern on Rt forearm Patient was given the opportunity to ask questions.  Patient verbalized understanding of the plan and was able to repeat key elements of the plan.   No orders of the defined types were placed in this encounter.    Requested Prescriptions   Pending Prescriptions Disp Refills  . buPROPion (WELLBUTRIN XL) 150 MG 24 hr tablet 30 tablet 0    Sig: Take 1 tablet (150 mg total) by mouth daily.    Return in about 2 months (around 05/17/2017).  Jonah Blue, MD, FACP

## 2017-03-24 ENCOUNTER — Telehealth: Payer: Self-pay | Admitting: Internal Medicine

## 2017-03-24 DIAGNOSIS — F419 Anxiety disorder, unspecified: Principal | ICD-10-CM

## 2017-03-24 DIAGNOSIS — F329 Major depressive disorder, single episode, unspecified: Secondary | ICD-10-CM

## 2017-03-24 MED ORDER — BUPROPION HCL ER (XL) 150 MG PO TB24: 150.0000 mg | ORAL_TABLET | Freq: Every day | ORAL | 0 refills | Status: DC

## 2017-03-24 MED FILL — ?BUPROPION HCL XL 300 MG TA: 300 | 30 days supply | Qty: 30 | Fill #2

## 2017-03-24 MED FILL — PROMETHAZINE 25 MG TABLET: 25 | 30 days supply | Qty: 90 | Fill #3

## 2017-03-24 MED FILL — HYDROCHLOROTHIAZIDE 12.5 MG: 12.5 | 30 days supply | Qty: 30 | Fill #7

## 2017-03-24 NOTE — Telephone Encounter (Signed)
Pt. Father came to facility stating that pt. Would like buPROPion (WELLBUTRIN XL) 150 MG 24 hr tablet  Sent to [facility] pharmacy. Please f/u

## 2017-03-24 NOTE — Telephone Encounter (Signed)
Prescription was sent to [facility], I have sent it to [facility] as the pharmacy is unable to complete the transfer today and patient needs today.

## 2017-03-28 ENCOUNTER — Institutional Professional Consult (permissible substitution): Payer: Self-pay | Admitting: Internal Medicine

## 2017-03-29 MED FILL — ?BUPROPION HCL XL 150 MG TA: 150 | 30 days supply | Qty: 30 | Fill #0

## 2017-03-30 ENCOUNTER — Other Ambulatory Visit: Payer: Self-pay | Admitting: Family Medicine

## 2017-03-30 ENCOUNTER — Other Ambulatory Visit: Payer: Self-pay | Admitting: Critical Care Medicine

## 2017-03-30 ENCOUNTER — Other Ambulatory Visit: Payer: Self-pay | Admitting: Internal Medicine

## 2017-03-30 DIAGNOSIS — M25572 Pain in left ankle and joints of left foot: Secondary | ICD-10-CM

## 2017-03-30 DIAGNOSIS — M25561 Pain in right knee: Principal | ICD-10-CM

## 2017-03-30 DIAGNOSIS — J0110 Acute frontal sinusitis, unspecified: Secondary | ICD-10-CM

## 2017-03-30 DIAGNOSIS — G8929 Other chronic pain: Secondary | ICD-10-CM

## 2017-03-30 MED FILL — ESCITALOPRAM 10 MG TABLET: 10 | 30 days supply | Qty: 30 | Fill #2

## 2017-03-30 MED FILL — ?CETIRIZINE HCL 10 MG TABLE: 10 | 30 days supply | Qty: 30 | Fill #4

## 2017-03-30 MED FILL — GABAPENTIN 300 MG CAPSULE: 300 | 30 days supply | Qty: 90 | Fill #3

## 2017-03-30 MED FILL — ?PANTOPRAZOLE SOD DR 40MG: 40 MG | 30 days supply | Qty: 30 | Fill #2

## 2017-03-30 NOTE — Telephone Encounter (Signed)
Patient refill request -

## 2017-03-31 ENCOUNTER — Other Ambulatory Visit: Payer: Self-pay | Admitting: Internal Medicine

## 2017-03-31 ENCOUNTER — Other Ambulatory Visit: Payer: Self-pay | Admitting: Family Medicine

## 2017-03-31 DIAGNOSIS — M25561 Pain in right knee: Principal | ICD-10-CM

## 2017-03-31 DIAGNOSIS — M25572 Pain in left ankle and joints of left foot: Secondary | ICD-10-CM

## 2017-03-31 DIAGNOSIS — J0110 Acute frontal sinusitis, unspecified: Secondary | ICD-10-CM

## 2017-03-31 DIAGNOSIS — G8929 Other chronic pain: Secondary | ICD-10-CM

## 2017-03-31 MED ORDER — PREDNISONE 10 MG PO TABS: ORAL_TABLET | ORAL | 0 refills | Status: DC

## 2017-04-05 ENCOUNTER — Encounter: Payer: Self-pay | Admitting: Internal Medicine

## 2017-04-05 NOTE — Telephone Encounter (Signed)
Patient response

## 2017-04-05 NOTE — Progress Notes (Unsigned)
We can discuss other options to help with pain management, but daily steroid is not the way to go given your age. This opens up a can of worms including osteoporosis leading to fractures with minimal or no trauma, localize death of hip bone called avascular necrosis, diabetes and the list goes on. I will have one of our front office staff reach out to you with an appointment.  ===View-only below this line===   ----- Message -----    From: Courtney Zamora    Sent: 04/05/2017  8:56 AM EDT      To: Jonah Blue, MD Subject: RE: RE: Medication Renewal Request  It also helps with pain... if my pain is super high I have a really bad asthma day... Yes I do know the negative effects on my body and I'm willing to risk that to be able to walk and not have asthma attacks everytime I want to move. With about 20 mg a day I can function properly and not be in so much pain that it causes issues...   ----- Message ----- From: Jonah Blue, MD Sent: 03/31/17 12:43 PM To: Courtney Zamora Subject: RE: Medication Renewal Request  Courtney Zamora: what is happening with your asthma? I am concerned that this is your third request for Prednisone from 02/18/2017 to now, all of which have been 10 day course. Will send some Prednisone to the pharmacy but if you have further flare, I want you to come in to be seen.  ----- Message -----    From: Courtney Zamora    Sent: 03/30/2017  4:02 PM EDT      To: Jonah Blue, MD Subject: Medication Renewal Request  Courtney Zamora would like a refill of the following medications:      predniSONE (DELTASONE) 10 MG tablet Jonah Blue, MD]  Preferred pharmacy: [id number] - [place], [place] - [address] AT [facility] [address]  Comment:

## 2017-04-05 NOTE — Telephone Encounter (Signed)
Called pt. And scheduled her an appt. For 04/10/17

## 2017-04-06 ENCOUNTER — Other Ambulatory Visit: Payer: Self-pay | Admitting: Pharmacist

## 2017-04-06 MED ORDER — ALBUTEROL SULFATE HFA 108 (90 BASE) MCG/ACT IN AERS: 2.0000 | INHALATION_SPRAY | Freq: Four times a day (QID) | RESPIRATORY_TRACT | 0 refills | Status: DC | PRN

## 2017-04-06 MED FILL — !VENTOLIN HFA INHALER: 108 (90 BAS | 25 days supply | Qty: 18 | Fill #0

## 2017-04-10 ENCOUNTER — Ambulatory Visit: Payer: Self-pay | Attending: Internal Medicine | Admitting: Internal Medicine

## 2017-04-10 ENCOUNTER — Encounter: Payer: Self-pay | Admitting: Internal Medicine

## 2017-04-10 ENCOUNTER — Other Ambulatory Visit: Payer: Self-pay | Admitting: Internal Medicine

## 2017-04-10 VITALS — BP 144/93 | HR 103 | Temp 98.5°F

## 2017-04-10 DIAGNOSIS — Z79899 Other long term (current) drug therapy: Secondary | ICD-10-CM | POA: Insufficient documentation

## 2017-04-10 DIAGNOSIS — M25572 Pain in left ankle and joints of left foot: Secondary | ICD-10-CM

## 2017-04-10 DIAGNOSIS — G8929 Other chronic pain: Secondary | ICD-10-CM

## 2017-04-10 DIAGNOSIS — M25561 Pain in right knee: Principal | ICD-10-CM

## 2017-04-10 DIAGNOSIS — M25511 Pain in right shoulder: Secondary | ICD-10-CM | POA: Insufficient documentation

## 2017-04-10 DIAGNOSIS — M25532 Pain in left wrist: Secondary | ICD-10-CM | POA: Insufficient documentation

## 2017-04-10 DIAGNOSIS — M25562 Pain in left knee: Secondary | ICD-10-CM | POA: Insufficient documentation

## 2017-04-10 DIAGNOSIS — M7121 Synovial cyst of popliteal space [Baker], right knee: Secondary | ICD-10-CM

## 2017-04-10 MED ORDER — DULOXETINE HCL 30 MG PO CPEP: 30.0000 mg | ORAL_CAPSULE | Freq: Every day | ORAL | 3 refills | Status: DC

## 2017-04-10 MED ORDER — FUROSEMIDE 20 MG PO TABS: 20.0000 mg | ORAL_TABLET | Freq: Every day | ORAL | 2 refills | Status: DC | PRN

## 2017-04-10 MED ORDER — MELOXICAM 15 MG PO TABS: 15.0000 mg | ORAL_TABLET | Freq: Every day | ORAL | 2 refills | Status: DC

## 2017-04-10 MED FILL — ?DULOXETINE HCL DR 30 MG CA: 30 MG | 30 days supply | Qty: 30 | Fill #0

## 2017-04-10 MED FILL — ?FUROSEMIDE 20 MG TABLET: 20 | 12 days supply | Qty: 12 | Fill #0
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
## 2017-04-11 ENCOUNTER — Ambulatory Visit: Payer: Self-pay

## 2017-04-11 MED ORDER — DICLOFENAC SODIUM 75 MG PO TBEC: 75.0000 mg | DELAYED_RELEASE_TABLET | Freq: Two times a day (BID) | ORAL | 2 refills | Status: DC | PRN

## 2017-04-11 MED ORDER — TIZANIDINE HCL 4 MG PO TABS: ORAL_TABLET | ORAL | 2 refills | Status: DC

## 2017-04-11 MED ORDER — ACETAMINOPHEN-CODEINE #3 300-30 MG PO TABS: 1.0000 | ORAL_TABLET | Freq: Four times a day (QID) | ORAL | 2 refills | Status: DC | PRN

## 2017-04-11 MED FILL — tiZANidine HCL 4 MG TABS: 4 | 15 days supply | Qty: 60 | Fill #0

## 2017-04-11 MED FILL — ACETAMINOPHEN/COD #3 TABLET: 300-30 | 30 days supply | Qty: 120 | Fill #0

## 2017-04-11 MED FILL — ?DICLOFENAC SOD DR 75 MG TA: 75 | 30 days supply | Qty: 60 | Fill #0

## 2017-04-11 NOTE — Addendum Note (Signed)
Addended by: Jonah Blue B on: 04/11/2017 08:36 AM   Modules accepted: Orders

## 2017-04-11 NOTE — Telephone Encounter (Signed)
Patient request

## 2017-04-11 NOTE — Progress Notes (Signed)
Patient ID: Courtney Zamora, female   DOB: 02-15-1986, 31 y.o.   MRN: [id number] Pt requesting refill on Tylenol 3, Zanaflex and Voltaren through my chart. Patient states she forgot to request these on her visit with me yesterday. N[facility] reviewed and is appropriate.  Will RF Tylenol # 3 1 tab Q 6 hrs PRN #120 with 2 RFs.  She was get 120 of Xanaflex a mth, will decrease this to 60.

## 2017-04-11 NOTE — Telephone Encounter (Signed)
Refill request

## 2017-04-12 ENCOUNTER — Encounter: Payer: Self-pay | Admitting: Internal Medicine

## 2017-04-12 NOTE — Telephone Encounter (Signed)
Patient concern

## 2017-04-15 ENCOUNTER — Encounter: Payer: Self-pay | Admitting: Internal Medicine

## 2017-04-16 ENCOUNTER — Other Ambulatory Visit: Payer: Self-pay | Admitting: Internal Medicine

## 2017-04-16 DIAGNOSIS — G8929 Other chronic pain: Secondary | ICD-10-CM

## 2017-04-16 DIAGNOSIS — M25532 Pain in left wrist: Secondary | ICD-10-CM

## 2017-04-16 DIAGNOSIS — M25511 Pain in right shoulder: Secondary | ICD-10-CM

## 2017-04-16 DIAGNOSIS — M25562 Pain in left knee: Principal | ICD-10-CM

## 2017-04-16 DIAGNOSIS — M25561 Pain in right knee: Principal | ICD-10-CM

## 2017-04-16 MED ORDER — DULOXETINE HCL 30 MG PO CPEP: 30.0000 mg | ORAL_CAPSULE | Freq: Two times a day (BID) | ORAL | 3 refills | Status: DC

## 2017-04-17 MED FILL — ?DULOXETINE HCL DR 30 MG CA: 30 MG | 30 days supply | Qty: 60 | Fill #0

## 2017-04-18 ENCOUNTER — Ambulatory Visit (HOSPITAL_COMMUNITY)
Admission: EM | Admit: 2017-04-18 | Discharge: 2017-04-18 | Disposition: A | Discharge status: Home / Self Care | Payer: Self-pay | Attending: Internal Medicine | Admitting: Internal Medicine

## 2017-04-18 ENCOUNTER — Encounter (HOSPITAL_COMMUNITY): Payer: Self-pay | Admitting: Emergency Medicine

## 2017-04-18 ENCOUNTER — Telehealth (HOSPITAL_COMMUNITY): Payer: Self-pay | Admitting: Emergency Medicine

## 2017-04-18 ENCOUNTER — Telehealth: Payer: Self-pay | Admitting: Internal Medicine

## 2017-04-18 DIAGNOSIS — M79671 Pain in right foot: Secondary | ICD-10-CM

## 2017-04-18 DIAGNOSIS — T8130XA Disruption of wound, unspecified, initial encounter: Secondary | ICD-10-CM

## 2017-04-18 MED ORDER — SULFAMETHOXAZOLE-TRIMETHOPRIM 800-160 MG PO TABS: 1.0000 | ORAL_TABLET | Freq: Two times a day (BID) | ORAL | 0 refills | Status: AC

## 2017-04-18 MED ORDER — PREDNISONE 10 MG PO TABS: ORAL_TABLET | ORAL | 0 refills | Status: DC

## 2017-04-18 NOTE — Telephone Encounter (Signed)
Per Dr. Sheryle Hail... Ok to call in Prednisone 10 mg; 2 tabs x15 days and then 1 tab last 15 days, #40 w/no refills.   Sent to [facility])

## 2017-04-18 NOTE — Telephone Encounter (Signed)
Patient's family member came into office requesting to speak to PCP, pt has been in a lot of pain recently and he is concerned of her health. Pt was in the car   and is not able to walk , due to the pain per patient' family member . Please f/up

## 2017-04-18 NOTE — ED Triage Notes (Signed)
Pt sts blister to right leg with some drainage; pt sts some right foot pain chronic in nature

## 2017-04-19 NOTE — Telephone Encounter (Signed)
Will forward to pcp

## 2017-04-20 ENCOUNTER — Telehealth: Payer: Self-pay | Admitting: Licensed Clinical Social Worker

## 2017-04-20 NOTE — Telephone Encounter (Signed)
Message noted. I was out of the office yesterday. I have spoken with our LCSW about this patient. She will try contacting her today

## 2017-04-20 NOTE — Telephone Encounter (Signed)
LCSWA made an attempt to follow up on behavioral health. A HIPPA compliant voice message was left requesting a return call.

## 2017-04-21 ENCOUNTER — Encounter: Payer: Self-pay | Admitting: Sports Medicine

## 2017-04-21 ENCOUNTER — Telehealth: Payer: Self-pay | Admitting: Licensed Clinical Social Worker

## 2017-04-21 DIAGNOSIS — M84374S Stress fracture, right foot, sequela: Secondary | ICD-10-CM

## 2017-04-21 DIAGNOSIS — S92901A Unspecified fracture of right foot, initial encounter for closed fracture: Secondary | ICD-10-CM

## 2017-04-21 NOTE — Telephone Encounter (Signed)
LCSWA made an attempt to follow up on behavioral health. A HIPPA compliant voice message was left requesting a return call.  

## 2017-04-25 MED FILL — ?PANTOPRAZOLE SOD DR 40MG: 40 MG | 30 days supply | Qty: 30 | Fill #3

## 2017-04-25 MED FILL — ?HYDROCHLOROTHIAZIDE 12.5MG: 12.5 | 30 days supply | Qty: 30 | Fill #8

## 2017-04-25 MED FILL — tiZANidine HCL 4 MG TABS: 4 | 15 days supply | Qty: 60 | Fill #1

## 2017-05-01 ENCOUNTER — Ambulatory Visit (INDEPENDENT_AMBULATORY_CARE_PROVIDER_SITE_OTHER): Payer: Self-pay | Admitting: Internal Medicine

## 2017-05-01 ENCOUNTER — Encounter: Payer: Self-pay | Admitting: Internal Medicine

## 2017-05-01 VITALS — BP 126/74 | HR 96 | Ht 64.0 in | Wt 399.0 lb

## 2017-05-01 DIAGNOSIS — J4540 Moderate persistent asthma, uncomplicated: Secondary | ICD-10-CM

## 2017-05-01 LAB — NITRIC OXIDE: NITRIC OXIDE: 7

## 2017-05-01 NOTE — Patient Instructions (Addendum)
Plan A = Automatic = qvar 2 pffs at bedtime (as you have been)  and add 2 pffs in am for any flare of wheeze/ cough/ short of breath  Plan B = Backup Only use your albuterol as a rescue medication to be used if you can't catch your breath by resting or doing a relaxed purse lip breathing pattern.  - The less you use it, the better it will work when you need it. - Ok to use the inhaler up to 2 puffs  every 4 hours if you must but call for appointment if use goes up over your usual need - Don't leave home without it !!  (think of it like the spare tire for your car)   Plan C = Crisis - only use your albuterol nebulizer if you first try Plan B and it fails to help > ok to use the nebulizer up to every 4 hours but if start needing it regularly call for immediate appointment    Please schedule a follow up office visit in 4 weeks, sooner if needed  with all medications /inhalers/ solutions in hand so we can verify exactly what you are taking. This includes all medications from all doctors and over the counters

## 2017-05-01 NOTE — Progress Notes (Signed)
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   05/01/2017 1st [facility] office visit/ Meia Emley   Chief Complaint  Patient presents with  . Pulmonary Consult    Referred by Dr. Shan Levans. Pt states she was dxed with Asthma in 2001.  She usually has difficulty when the seasons change. She c/o non prod cough, wheezing and increased SOB for the past 2 wks. She states she also has occ trouble with swallowing. She uses her albuterol inhaler 3-4 x per wk on average and rarely uses neb.   can't lie on back every noct x 3 months s smothering  Sitting still fine most of the time  But if has a cold has to use saba sitting still and otherwise just one or twice daily  Sometimes sob assoc with cough / sinus infections  Says having good control recently (although note told nursing different hx) and no more than 2 x weeks saba now but still doe = MMRC2 = can't walk a nl pace on a flat grade s sob but does fine slow and flat eg shopping  On 3 different inhalers she cannot name but does know symb 160 caused burning and didn't help breathing    No obvious day to day or daytime variability or assoc excess/ purulent sputum or mucus plugs or hemoptysis or cp or chest tightness,  or overt sinus or hb symptoms. No unusual exp hx or h/o childhood pna/    Sleeping ok off back without nocturnal  or early am exacerbation  of respiratory  c/o's or need for noct saba. Also denies any obvious fluctuation of symptoms with  environmental changes or other  aggravating or alleviating factors except as outlined above   Current Allergies, Complete Past Medical History, Past Surgical History, Family History, and Social History were reviewed in [employer] record.  ROS  The following are not active complaints unless bolded Hoarseness, sore throat, dysphagia, dental problems, itching, sneezing,  nasal congestion or discharge of excess mucus or purulent secretions, ear ache,   fever, chills, sweats, unintended wt loss or wt gain, classically pleuritic or exertional cp,  orthopnea pnd or leg swelling, presyncope, palpitations, abdominal pain, anorexia, nausea, vomiting, diarrhea  or change in bowel habits or change in bladder habits, change in stools or change in urine, dysuria, hematuria,  rash, arthralgias, visual complaints, headache, numbness, weakness or ataxia or problems with walking or coordination,  change in mood/affect or memory.        Current Meds - unable verify - on some form of qvar at hs also   Medication Sig  . acetaminophen-codeine (TYLENOL #3) 300-30 MG tablet Take 1 tablet by mouth every 6 (six) hours as needed for moderate pain.  Marland Kitchen albuterol (PROVENTIL HFA;VENTOLIN HFA) 108 (90 Base) MCG/ACT inhaler Inhale 2 puffs into the lungs every 6 (six) hours as needed for wheezing or shortness of breath (wheezing). For shortness  of breath.  Marland Kitchen albuterol (PROVENTIL) (2.5 MG/3ML) 0.083% nebulizer solution Take 3 mLs (2.5 mg total) by nebulization every 6 (six) hours as needed for wheezing or shortness of breath.  . budesonide-formoterol (SYMBICORT) 160-4.5 MCG/ACT inhaler Inhale 2 puffs into the lungs 2 (two) times daily.  Marland Kitchen buPROPion (WELLBUTRIN XL) 150 MG 24 hr tablet Take 1 tablet (150 mg total) by mouth daily.  . Calcium Citrate 200 MG TABS Take 2 tablets (400 mg total) by mouth every other day.  . cetirizine (ZYRTEC) 10 MG tablet Take 1 tablet (10 mg total) by mouth daily.  . Cholecalciferol (VITAMIN D3) 5000 units CAPS  Take 5,000 Units by mouth once a week.  . diclofenac (VOLTAREN) 75 MG EC tablet Take 1 tablet (75 mg total) by mouth 2 (two) times daily as needed.  . DULoxetine (CYMBALTA) 30 MG capsule Take 1 capsule (30 mg total) by mouth 2 (two) times daily.  . fluticasone (FLONASE) 50 MCG/ACT nasal spray Place 2 sprays into both nostrils 2 (two) times daily.  . furosemide (LASIX) 20 MG tablet Take 1 tablet (20 mg total) by mouth daily as needed.  . gabapentin (NEURONTIN) 300 MG capsule Take 1 capsule (300 mg total) by mouth 3 (three) times daily.  . hydrochlorothiazide (HYDRODIURIL) 12.5 MG tablet Take 1 tablet (12.5 mg total) by mouth daily.  . Multiple Vitamins-Minerals (WOMENS MULTI) CAPS Take 1 capsule by mouth daily.  . pantoprazole (PROTONIX) 40 MG tablet Take 1 tablet (40 mg total) by mouth daily.  . predniSONE (DELTASONE) 10 MG tablet Take 2 tabs for 3 days then 1 tabs for 3 days  . promethazine (PHENERGAN) 25 MG tablet Take 1 tablet (25 mg total) by mouth every 8 (eight) hours as needed for nausea or vomiting.  Marland Kitchen Spacer/Aero-Holding Chambers (AEROCHAMBER MV) inhaler Use as instructed  . tiZANidine (ZANAFLEX) 4 MG tablet TAKE 1 TABLET BY MOUTH EVERY 6 HOURS AS NEEDED FOR MUSCLE SPASMS.               Review of Systems     Objective:   Physical Exam  masively obese wf nad  Wt Readings from Last 3 Encounters:  05/01/17 (!) 399 lb (181 kg)  03/17/17 (!) 389 lb 9.6 oz (176.7 kg)  02/09/17 (!) 382 lb 12.8 oz (173.6 kg)    Vital signs reviewed   .HEENT: nl dentition, turbinates bilaterally, and oropharynx. Nl external ear canals without cough reflex   NECK :  without JVD/Nodes/TM/ nl carotid upstrokes bilaterally   LUNGS: no acc muscle use,  Nl contour chest which is clear to A and P bilaterally without cough on insp or exp maneuvers   CV:  RRR  no s3 or murmur or increase in P2, and no edema   ABD:  masive soft and nontender with very limited inspiratory excursion in the  sitting position. No bruits or organomegaly appreciated, bowel sounds nl  MS:  Nl gait/ ext warm without deformities, calf tenderness, cyanosis or clubbing No obvious joint restrictions   SKIN: warm and dry without lesions    NEURO:  alert, approp, nl sensorium with  no motor or cerebellar deficits apparent.      I personally reviewed images and agree with radiology impression as follows:  CXR:   12/09/16  Normal chest x-ray.     Assessment:

## 2017-05-01 NOTE — Assessment & Plan Note (Addendum)
05/01/2017   Walked  185 ft   stopped due to  Sob/ knees gave out, nl pace no sob  - FENO 05/01/2017  =   7  - Spirometry 05/01/2017  FEV1 2.28 (72%)  Ratio 90 s prior rx/ no curvature at all -  05/01/2017  After extensive coaching HFA effectiveness =    90% s spacer   Symptoms are markedly disproportionate to objective findings and not clear this is actually much of a  lung problem but pt does appear to have difficult to sort out respiratory symptoms of unknown origin for which  DDX  = almost all start with A and  include Adherence, Ace Inhibitors, Acid Reflux, Active Sinus Disease, Alpha 1 Antitripsin deficiency, Anxiety masquerading as Airways dz,  ABPA,  Allergy(esp in young), Aspiration (esp in elderly), Adverse effects of meds,  Active smokers, A bunch of PE's (a small clot burden can't cause this syndrome unless there is already severe underlying pulm or vascular dz with poor reserve) plus two Bs  = Bronchiectasis and Beta blocker use..and one C= CHF     Adherence is always the initial "prime suspect" and is a multilayered concern that requires a "trust but verify" approach in every patient - starting with knowing how to use medications, especially inhalers, correctly, keeping up with refills and understanding the fundamental difference between maintenance and prns vs those medications only taken for a very short course and then stopped and not refilled.  -see hfa teaching - return in 4 weeks with all meds in hand using a trust but verify approach to confirm accurate Medication  Reconciliation The principal here is that until we are certain that the  patients are doing what we've asked, it makes no sense to ask them to do more.    ? Allergy > very unlikely based on FENO so low  ? Active sinus dz > could be a trigger worth exploring at next flare  ? Anxiety > usually at the bottom of this list of usual suspects but should be much higher on this pt's based on H and P and note already on  psychotropics .   ? Acid (or non-acid) GERD > always difficult to exclude as up to 75% of pts in some series report no assoc GI/ Heartburn symptoms>  Continue ppi qd    Will see her back in 4 weeks with full pfts   Total time devoted to counseling  > 50 % of initial 60 min office visit:  review case with pt/ discussion of options/alternatives/ personally creating written customized instructions  in presence of pt  then going over those specific  Instructions directly with the pt including how to use all of the meds but in particular covering each new medication in detail and the difference between the maintenance= "automatic" meds and the prns using an action plan format for the latter (If this problem/symptom => do that organization reading Left to right).  Please see AVS from this visit for a full list of these instructions which I personally wrote for this pt and  are unique to this visit.

## 2017-05-01 NOTE — Assessment & Plan Note (Signed)
Body mass index is 68.49 kg/m.  -  trending up No results found for: TSH   Contributing to gerd risk/ doe/reviewed the need and the process to achieve and maintain neg calorie balance > defer f/u primary care including intermittently monitoring thyroid status

## 2017-05-02 ENCOUNTER — Encounter: Payer: Self-pay | Admitting: Internal Medicine

## 2017-05-02 ENCOUNTER — Telehealth: Payer: Self-pay

## 2017-05-02 ENCOUNTER — Encounter: Payer: Self-pay | Admitting: Sports Medicine

## 2017-05-02 MED ORDER — DIAZEPAM 10 MG PO TABS: ORAL_TABLET | ORAL | 0 refills | Status: DC

## 2017-05-02 NOTE — Telephone Encounter (Signed)
See phone note

## 2017-05-03 ENCOUNTER — Ambulatory Visit
Admission: RE | Admit: 2017-05-03 | Discharge: 2017-05-03 | Disposition: A | Discharge status: Home / Self Care | Payer: No Typology Code available for payment source | Source: Ambulatory Visit | Attending: Sports Medicine | Admitting: Sports Medicine

## 2017-05-03 DIAGNOSIS — S92901A Unspecified fracture of right foot, initial encounter for closed fracture: Secondary | ICD-10-CM

## 2017-05-03 DIAGNOSIS — M84374S Stress fracture, right foot, sequela: Secondary | ICD-10-CM

## 2017-05-04 ENCOUNTER — Ambulatory Visit (INDEPENDENT_AMBULATORY_CARE_PROVIDER_SITE_OTHER): Payer: Self-pay

## 2017-05-04 ENCOUNTER — Encounter (INDEPENDENT_AMBULATORY_CARE_PROVIDER_SITE_OTHER): Payer: Self-pay | Admitting: Specialist

## 2017-05-04 ENCOUNTER — Ambulatory Visit (INDEPENDENT_AMBULATORY_CARE_PROVIDER_SITE_OTHER): Payer: Self-pay | Admitting: Specialist

## 2017-05-04 ENCOUNTER — Other Ambulatory Visit: Payer: Self-pay | Admitting: *Deleted

## 2017-05-04 VITALS — Ht 64.0 in | Wt 399.0 lb

## 2017-05-04 DIAGNOSIS — S6982XS Other specified injuries of left wrist, hand and finger(s), sequela: Secondary | ICD-10-CM

## 2017-05-04 DIAGNOSIS — M25532 Pain in left wrist: Secondary | ICD-10-CM

## 2017-05-04 DIAGNOSIS — M778 Other enthesopathies, not elsewhere classified: Secondary | ICD-10-CM

## 2017-05-04 MED ORDER — DICLOFENAC SODIUM 1 % TD GEL: 2.0000 g | Freq: Four times a day (QID) | TRANSDERMAL | 3 refills | Status: DC

## 2017-05-04 MED ORDER — DIAZEPAM 10 MG PO TABS: ORAL_TABLET | ORAL | 0 refills | Status: DC

## 2017-05-04 MED FILL — VOLTAREN 1% GEL: 1 | 12 days supply | Qty: 100 | Fill #0

## 2017-05-04 NOTE — Progress Notes (Signed)
Office Visit Note   Patient: Courtney Zamora           Date of Birth: Jun 30, 1986           MRN: [id number] Visit Date: 05/04/2017              Requested by: Marcine Matar, MD [address] [place], [place] [address] PCP: Marcine Matar, MD   Assessment & Plan: Visit Diagnoses:  1. Pain in left wrist   2. Tendonitis of wrist, left   3. TFCC (triangular fibrocartilage complex) injury, left, sequela     Plan: Left hand strengthening of wrist extension and flexion and grip strengthing.  Use voltaren gel 2 gm rub into the wrist dorsally TID. Increase voltaren 50 mg to TID with meal or snack.  Decrease the use of the wrist by 50% for 2 weeks. We will try to schedule an appt to see a hand specialist to consider left wrist Arthroscopy.   Follow-Up Instructions: Return in about 2 years (around 05/05/2019).   Orders:  Orders Placed This Encounter  Procedures  . XR Wrist Complete Left   Meds ordered this encounter  Medications  . diclofenac sodium (VOLTAREN) 1 % GEL    Sig: Apply 2 g topically 4 (four) times daily.    Dispense:  3 Tube    Refill:  3      Procedures: No procedures performed   Clinical Data: No additional findings.   Subjective: Chief Complaint  Patient presents with  . Left Wrist - Pain    31 year old right handed female with history of left wrist pain for almost one year. 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 She reports having fractures of the left knee and ankle. No surgery was done, she reports that  Workman's compensation would not allow her to undergo surgery. She was told by Dr. 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 Her mother is Tannis Burstein, and I have seen her husband. She has pain in the left hand and wrist dorsally, worse in the morning  and after taking care of her 89 month old sister's daugther. Increasing pain with typing and use of the hand and wrist at school. No numbness or tingling. There is stiffness in the left thumb and index with stiffness and into the left little finger.     Review of Systems  Constitutional: Positive for activity change, appetite change and unexpected weight change. Negative for chills, diaphoresis, fatigue and fever.  HENT: Positive for congestion, rhinorrhea, sinus pain and sinus pressure. Negative for ear discharge, ear pain, facial swelling, hearing loss, nosebleeds, postnasal drip, sneezing, sore throat, tinnitus, trouble swallowing and voice change.   Eyes: Negative for photophobia, pain, discharge, redness, itching and visual disturbance.  Respiratory: Negative for apnea, chest tightness, shortness of breath and wheezing.   Cardiovascular: Positive for leg swelling. Negative for chest pain.  Gastrointestinal: Negative.  Negative for abdominal distention, abdominal pain, constipation, diarrhea, nausea, rectal pain and vomiting.  Endocrine: Negative.   Genitourinary: Negative.  Negative for difficulty urinating, dyspareunia, dysuria, enuresis, flank pain and frequency.  Musculoskeletal: Positive for joint swelling, neck pain and neck stiffness. Negative for arthralgias and back pain.  Skin: Negative.  Negative for color change, rash and wound.  Allergic/Immunologic: Negative.  Neurological: Positive for seizures and weakness. Negative for syncope, facial asymmetry, speech difficulty, light-headedness, numbness and headaches.  Hematological: Negative.  Negative for adenopathy. Does not bruise/bleed easily.  Psychiatric/Behavioral: Negative.  Negative for agitation, behavioral problems, confusion, decreased concentration, dysphoric mood, hallucinations, self-injury, sleep disturbance and suicidal ideas. The patient is not nervous/anxious and is not hyperactive.      Objective: Vital Signs: Ht  5\' 4"  (1.626 m)   Wt (!) 399 lb (181 kg)   BMI 68.49 kg/m   Physical Exam  Constitutional: She is oriented to person, place, and time. She appears well-developed and well-nourished.  HENT:  Head: Normocephalic and atraumatic.  Eyes: Pupils are equal, round, and reactive to light. EOM are normal.  Neck: Normal range of motion. Neck supple.  Pulmonary/Chest: Effort normal and breath sounds normal.  Abdominal: Soft. Bowel sounds are normal.  Musculoskeletal: Normal range of motion.  Neurological: She is alert and oriented to person, place, and time.  Skin: Skin is warm and dry.  Psychiatric: She has a normal mood and affect. Her behavior is normal. Judgment and thought content normal.    Ortho Exam  Specialty Comments:  No specialty comments available.  Imaging: No results found.   PMFS History: Patient Active Problem List   Diagnosis Date Noted  . Acute frontal sinusitis 12/15/2016  . Metatarsal stress fracture, right, sequela 11/17/2016  . Neck muscle spasm 10/05/2016  . Nipple discharge in female 09/13/2016  . Depression 08/17/2016  . Anxiety 08/17/2016  . ASCUS with positive high risk HPV cervical 08/14/2016  . Hypertension 08/14/2016  . Long term (current) use of systemic steroids 08/14/2016  . Esophageal reflux 12/29/2015  . Morbid (severe) obesity due to excess calories (HCC) 12/29/2015  . Chronic pain of right knee 12/29/2015  . Chronic pain of left ankle 12/29/2015  . 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 Asthma, moderate persistent 12/09/2015   Past Medical History:  Diagnosis Date  . Anxiety   . Asthma   . Depression   . Obesity   . Obesity   . Seizures (HCC)   . Severe needle phobia     Family History  Problem Relation Age of Onset  . Hypothyroidism Mother   . Diabetes Father   . Diabetes Paternal Uncle   . Hypertension Maternal Grandmother   . Hypertension Maternal Grandfather   . Pancreatic cancer Maternal Grandfather   . Hypertension Paternal  Grandmother   . Heart disease Paternal Grandfather   . Hypertension Paternal Grandfather     Past Surgical History:  Procedure Laterality Date  . TONSILLECTOMY     Social History   Occupational History  . Not on file.   Social History Main Topics  . Smoking status: Never Smoker  . Smokeless tobacco: Never Used  . Alcohol use No  . Drug use: No  . Sexual activity: Yes    Birth control/ protection: None

## 2017-05-04 NOTE — Patient Instructions (Addendum)
Left hand strengthening of wrist extension and flexion and grip strengthing.  Use voltaren gel 2 gm rub into the wrist dorsally TID. Increase voltaren 50 mg to TID with meal or snack.  Decrease the use of the wrist by 50% for 2 weeks. We will try to schedule an appt to see a hand specialist to consider left wrist Arthroscopy.  Wrist spint for 1/2 days at the most.

## 2017-05-08 ENCOUNTER — Ambulatory Visit: Payer: Self-pay | Admitting: Sports Medicine

## 2017-05-09 ENCOUNTER — Emergency Department (HOSPITAL_BASED_OUTPATIENT_CLINIC_OR_DEPARTMENT_OTHER)
Admission: EM | Admit: 2017-05-09 | Discharge: 2017-05-10 | Disposition: A | Discharge status: Home / Self Care | Payer: Self-pay | Attending: Emergency Medicine | Admitting: Emergency Medicine

## 2017-05-09 ENCOUNTER — Emergency Department (HOSPITAL_BASED_OUTPATIENT_CLINIC_OR_DEPARTMENT_OTHER): Payer: Self-pay

## 2017-05-09 ENCOUNTER — Encounter (HOSPITAL_BASED_OUTPATIENT_CLINIC_OR_DEPARTMENT_OTHER): Payer: Self-pay | Admitting: *Deleted

## 2017-05-09 DIAGNOSIS — X509XXA Other and unspecified overexertion or strenuous movements or postures, initial encounter: Secondary | ICD-10-CM | POA: Insufficient documentation

## 2017-05-09 DIAGNOSIS — J45909 Unspecified asthma, uncomplicated: Secondary | ICD-10-CM | POA: Insufficient documentation

## 2017-05-09 DIAGNOSIS — Z79899 Other long term (current) drug therapy: Secondary | ICD-10-CM | POA: Insufficient documentation

## 2017-05-09 DIAGNOSIS — I10 Essential (primary) hypertension: Secondary | ICD-10-CM | POA: Insufficient documentation

## 2017-05-09 DIAGNOSIS — Y998 Other external cause status: Secondary | ICD-10-CM | POA: Insufficient documentation

## 2017-05-09 DIAGNOSIS — S93401A Sprain of unspecified ligament of right ankle, initial encounter: Secondary | ICD-10-CM | POA: Insufficient documentation

## 2017-05-09 DIAGNOSIS — Y9389 Activity, other specified: Secondary | ICD-10-CM | POA: Insufficient documentation

## 2017-05-09 DIAGNOSIS — Y92013 Bedroom of single-family (private) house as the place of occurrence of the external cause: Secondary | ICD-10-CM | POA: Insufficient documentation

## 2017-05-09 MED FILL — ?DICLOFENAC SOD DR 75 MG TA: 75 | 30 days supply | Qty: 60 | Fill #1

## 2017-05-09 NOTE — ED Triage Notes (Signed)
Pain in her right foot and ankle. States she twisted it this am.

## 2017-05-09 NOTE — ED Provider Notes (Signed)
[facility] EMERGENCY DEPARTMENT Provider Note   CSN: [id number] Arrival date & time: 05/09/17  2054     History   Chief Complaint Chief Complaint  Patient presents with  . Ankle Injury  . Foot Injury    HPI Courtney Zamora is a 31 y.o. female who presents with right foot and ankle pain.  Past medical history significant for morbid obesity, asthma.  Patient states that this morning she twisted her ankle while she was in the bed when she got "tangled up in the blankets".  She reports severe pain in her right ankle and foot but has been able to ambulate.  Walking makes it worse.  Nothing is made it better.  She denies any falls.  She takes Tylenol 3 for pain and states "not touching it".  No numbness, tingling, or weakness.  HPI  Past Medical History:  Diagnosis Date  . Anxiety   . Asthma   . Depression   . Obesity   . Obesity   . Seizures (HCC)   . Severe needle phobia     Patient Active Problem List   Diagnosis Date Noted  . Acute frontal sinusitis 12/15/2016  . Metatarsal stress fracture, right, sequela 11/17/2016  . Neck muscle spasm 10/05/2016  . Nipple discharge in female 09/13/2016  . Depression 08/17/2016  . Anxiety 08/17/2016  . ASCUS with positive high risk HPV cervical 08/14/2016  . Hypertension 08/14/2016  . Long term (current) use of systemic steroids 08/14/2016  . Esophageal reflux 12/29/2015  . Morbid (severe) obesity due to excess calories (HCC) 12/29/2015  . Chronic pain of right knee 12/29/2015  . Chronic pain of left ankle 12/29/2015  . 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 Asthma, moderate persistent 12/09/2015    Past Surgical History:  Procedure Laterality Date  . TONSILLECTOMY      OB History    Gravida Para Term Preterm AB Living   1       1 0   SAB TAB Ectopic Multiple Live Births   1               Home Medications    Prior to Admission medications   Medication Sig Start Date End Date Taking? Authorizing Provider    acetaminophen-codeine (TYLENOL #3) 300-30 MG tablet Take 1 tablet by mouth every 6 (six) hours as needed for moderate pain. 04/11/17   Marcine Matar, MD  albuterol (PROVENTIL HFA;VENTOLIN HFA) 108 (90 Base) MCG/ACT inhaler Inhale 2 puffs into the lungs every 6 (six) hours as needed for wheezing or shortness of breath (wheezing). For shortness of breath. 04/06/17   Marcine Matar, MD  albuterol (PROVENTIL) (2.5 MG/3ML) 0.083% nebulizer solution Take 3 mLs (2.5 mg total) by nebulization every 6 (six) hours as needed for wheezing or shortness of breath. 11/17/16   Funches, Gerilyn Nestle, MD  budesonide-formoterol (SYMBICORT) 160-4.5 MCG/ACT inhaler Inhale 2 puffs into the lungs 2 (two) times daily. 03/18/16   Funches, Gerilyn Nestle, MD  buPROPion (WELLBUTRIN XL) 150 MG 24 hr tablet Take 1 tablet (150 mg total) by mouth daily. 03/24/17   Marcine Matar, MD  Calcium Citrate 200 MG TABS Take 2 tablets (400 mg total) by mouth every other day. 10/05/16   Newt Lukes, MD  cetirizine (ZYRTEC) 10 MG tablet Take 1 tablet (10 mg total) by mouth daily. 11/22/16   Storm Frisk, MD  Cholecalciferol (VITAMIN D3) 5000 units CAPS Take 5,000 Units by mouth once a week.  [provider]  diazepam (VALIUM) 10 MG tablet Take 1 pill 1 hour before the MRI. 05/04/17   Ralene Cork, DO  diclofenac (VOLTAREN) 75 MG EC tablet Take 1 tablet (75 mg total) by mouth 2 (two) times daily as needed. 04/11/17   Marcine Matar, MD  diclofenac sodium (VOLTAREN) 1 % GEL Apply 2 g topically 4 (four) times daily. 05/04/17   Kerrin Champagne, MD  DULoxetine (CYMBALTA) 30 MG capsule Take 1 capsule (30 mg total) by mouth 2 (two) times daily. 04/16/17   Marcine Matar, MD  fluticasone (FLONASE) 50 MCG/ACT nasal spray Place 2 sprays into both nostrils 2 (two) times daily. 07/06/16   Storm Frisk, MD  furosemide (LASIX) 20 MG tablet Take 1 tablet (20 mg total) by mouth daily as needed. 04/10/17   Marcine Matar, MD   gabapentin (NEURONTIN) 300 MG capsule Take 1 capsule (300 mg total) by mouth 3 (three) times daily. 01/10/17   Marcine Matar, MD  hydrochlorothiazide (HYDRODIURIL) 12.5 MG tablet Take 1 tablet (12.5 mg total) by mouth daily. 09/13/16   Dessa Phi, MD  Multiple Vitamins-Minerals (WOMENS MULTI) CAPS Take 1 capsule by mouth daily.    [provider]  pantoprazole (PROTONIX) 40 MG tablet Take 1 tablet (40 mg total) by mouth daily. 02/06/17   Funches, Gerilyn Nestle, MD  predniSONE (DELTASONE) 10 MG tablet Take 2 tabs for 3 days then 1 tabs for 3 days 03/31/17   Marcine Matar, MD  promethazine (PHENERGAN) 25 MG tablet Take 1 tablet (25 mg total) by mouth every 8 (eight) hours as needed for nausea or vomiting. 11/17/16   Dessa Phi, MD  Spacer/Aero-Holding Chambers (AEROCHAMBER MV) inhaler Use as instructed 01/25/17   Storm Frisk, MD  tiZANidine (ZANAFLEX) 4 MG tablet TAKE 1 TABLET BY MOUTH EVERY 6 HOURS AS NEEDED FOR MUSCLE SPASMS. 04/11/17   Marcine Matar, MD    Family History Family History  Problem Relation Age of Onset  . Hypothyroidism Mother   . Diabetes Father   . Diabetes Paternal Uncle   . Hypertension Maternal Grandmother   . Hypertension Maternal Grandfather   . Pancreatic cancer Maternal Grandfather   . Hypertension Paternal Grandmother   . Heart disease Paternal Grandfather   . Hypertension Paternal Grandfather     Social History Social History  Substance Use Topics  . Smoking status: Never Smoker  . Smokeless tobacco: Never Used  . Alcohol use No     Allergies   Bee venom; Cinnamon; Ibuprofen; Lexapro [escitalopram oxalate]; and Tape   Review of Systems Review of Systems  Musculoskeletal: Positive for arthralgias. Negative for gait problem.  Neurological: Negative for weakness and numbness.     Physical Exam Updated Vital Signs BP (!) 180/104 (BP Location: Left Arm)   Pulse (!) 102   Temp 98.1 F (36.7 C) (Oral)   Resp 20   Ht  5\' 5"  (1.651 m)   Wt (!) 181 kg (399 lb)   SpO2 99%   BMI 66.40 kg/m   Physical Exam  Constitutional: She is oriented to person, place, and time. She appears well-developed and well-nourished. No distress.  Morbidly obese  HENT:  Head: Normocephalic and atraumatic.  Eyes: Pupils are equal, round, and reactive to light. Conjunctivae are normal. Right eye exhibits no discharge. Left eye exhibits no discharge. No scleral icterus.  Neck: Normal range of motion.  Cardiovascular: Normal rate.   Pulmonary/Chest: Effort normal. No respiratory distress.  Abdominal:  She exhibits no distension.  Musculoskeletal:  Right ankle and foot: Marked swelling of the lower extremity which seems to be chronic. Tenderness to palpation of dorsal and medial aspect of foot and medial ankle. N/V intact.   Neurological: She is alert and oriented to person, place, and time.  Skin: Skin is warm and dry.  Several oozing blisters draining serous fluid due to marked peripheral edema  Psychiatric: She has a normal mood and affect. Her behavior is normal.  Nursing note and vitals reviewed.    ED Treatments / Results  Labs (all labs ordered are listed, but only abnormal results are displayed) Labs Reviewed - No data to display  EKG  EKG Interpretation None       Radiology Dg Ankle Complete Right  Result Date: 05/09/2017 CLINICAL DATA:  Right ankle twisting injury with swelling EXAM: RIGHT ANKLE - COMPLETE 3+ VIEW COMPARISON:  None. FINDINGS: Diffuse subcutaneous edema. No acute displaced fracture or malalignment. Ankle mortise is symmetric. IMPRESSION: Soft tissue swelling.  No acute osseous abnormality. Electronically Signed   By: Jasmine Pang M.D.   On: 05/09/2017 21:44   Dg Foot Complete Right  Result Date: 05/09/2017 CLINICAL DATA:  Right foot twisting injury pain to the plantar surface and arch EXAM: RIGHT FOOT COMPLETE - 3+ VIEW COMPARISON:  11/28/2016 FINDINGS: No definite acutedisplaced fracture  is visualized. Old fracture deformity involving the fifth proximal phalanx. Mild degenerative changes at the first MTP joint. IMPRESSION: 1. No definite acute osseous abnormality. 2. Old fracture deformity of the fifth proximal phalanx Electronically Signed   By: Jasmine Pang M.D.   On: 05/09/2017 21:43    Procedures Procedures (including critical care time)  Medications Ordered in ED Medications - No data to display   Initial Impression / Assessment and Plan / ED Course  I have reviewed the triage vital signs and the nursing notes.  Pertinent labs & imaging results that were available during my care of the patient were reviewed by me and considered in my medical decision making (see chart for details).  31 year old who presents with ankle pain consistent with sprain.  Xrays are negative for bony pathology.  ASO brace did not fit.  Ace wrap was given as well as crutches.  RICE protocol discussed.  Advised follow-up with PCP.  Final Clinical Impressions(s) / ED Diagnoses   Final diagnoses:  Sprain of right ankle, unspecified ligament, initial encounter    New Prescriptions New Prescriptions   No medications on file     Bethel Born, PA-C 05/10/17 0019    Ward, Layla Maw, DO 05/10/17 0025

## 2017-05-10 ENCOUNTER — Other Ambulatory Visit (INDEPENDENT_AMBULATORY_CARE_PROVIDER_SITE_OTHER): Payer: Self-pay | Admitting: Specialist

## 2017-05-10 ENCOUNTER — Encounter (INDEPENDENT_AMBULATORY_CARE_PROVIDER_SITE_OTHER): Payer: Self-pay | Admitting: Specialist

## 2017-05-10 MED ORDER — HYDROCODONE-ACETAMINOPHEN 5-325 MG PO TABS: 1.0000 | ORAL_TABLET | Freq: Four times a day (QID) | ORAL | 0 refills | Status: DC | PRN

## 2017-05-10 MED ORDER — DICLOFENAC SODIUM 75 MG PO TBEC: 75.0000 mg | DELAYED_RELEASE_TABLET | Freq: Two times a day (BID) | ORAL | 2 refills | Status: DC | PRN

## 2017-05-10 NOTE — Discharge Instructions (Signed)
Ice - ice for 20 minutes at a time, several times a day Compression - wear ACE wrap to provide support Elevate - elevate ankle above level of heart to reduce swelling Ibuprofen - take with food. Take up to 3-4 times daily

## 2017-05-13 ENCOUNTER — Ambulatory Visit: Admission: RE | Admit: 2017-05-13 | Payer: Self-pay | Source: Ambulatory Visit

## 2017-05-15 MED FILL — ?CETIRIZINE HCL 10 MG TABLE: 10 | 30 days supply | Qty: 30 | Fill #5

## 2017-05-15 MED FILL — ?DULOXETINE HCL DR 30 MG CA: 30 MG | 30 days supply | Qty: 60 | Fill #1

## 2017-05-15 MED FILL — ACETAMINOPHEN/COD #3 TABLET: 300-30 | 30 days supply | Qty: 120 | Fill #1

## 2017-05-15 MED FILL — tiZANidine HCL 4 MG TABS: 4 | 15 days supply | Qty: 60 | Fill #2

## 2017-05-15 MED FILL — PROMETHAZINE 25 MG TABLET: 25 | 30 days supply | Qty: 90 | Fill #4

## 2017-05-15 MED FILL — ?FUROSEMIDE 20 MG TABLET: 20 | 24 days supply | Qty: 24 | Fill #1

## 2017-05-16 ENCOUNTER — Telehealth: Payer: Self-pay | Admitting: Physical Therapy

## 2017-05-16 ENCOUNTER — Ambulatory Visit: Payer: Self-pay | Admitting: Sports Medicine

## 2017-05-16 NOTE — Telephone Encounter (Signed)
Left message to call to schedule PT eval on 10/22 & 05/16/17. No return call

## 2017-05-18 ENCOUNTER — Encounter: Payer: Self-pay | Admitting: Internal Medicine

## 2017-05-18 ENCOUNTER — Ambulatory Visit: Payer: Self-pay | Attending: Internal Medicine | Admitting: Internal Medicine

## 2017-05-18 DIAGNOSIS — M25532 Pain in left wrist: Secondary | ICD-10-CM | POA: Insufficient documentation

## 2017-05-18 DIAGNOSIS — I10 Essential (primary) hypertension: Secondary | ICD-10-CM | POA: Insufficient documentation

## 2017-05-18 DIAGNOSIS — J4541 Moderate persistent asthma with (acute) exacerbation: Secondary | ICD-10-CM | POA: Insufficient documentation

## 2017-05-18 DIAGNOSIS — K219 Gastro-esophageal reflux disease without esophagitis: Secondary | ICD-10-CM | POA: Insufficient documentation

## 2017-05-18 DIAGNOSIS — L989 Disorder of the skin and subcutaneous tissue, unspecified: Secondary | ICD-10-CM

## 2017-05-18 DIAGNOSIS — F419 Anxiety disorder, unspecified: Secondary | ICD-10-CM | POA: Insufficient documentation

## 2017-05-18 DIAGNOSIS — M170 Bilateral primary osteoarthritis of knee: Secondary | ICD-10-CM | POA: Insufficient documentation

## 2017-05-18 DIAGNOSIS — F329 Major depressive disorder, single episode, unspecified: Secondary | ICD-10-CM | POA: Insufficient documentation

## 2017-05-18 DIAGNOSIS — M25572 Pain in left ankle and joints of left foot: Secondary | ICD-10-CM | POA: Insufficient documentation

## 2017-05-18 DIAGNOSIS — Z79899 Other long term (current) drug therapy: Secondary | ICD-10-CM | POA: Insufficient documentation

## 2017-05-18 DIAGNOSIS — Z7952 Long term (current) use of systemic steroids: Secondary | ICD-10-CM | POA: Insufficient documentation

## 2017-05-18 DIAGNOSIS — G894 Chronic pain syndrome: Secondary | ICD-10-CM | POA: Insufficient documentation

## 2017-05-18 MED ORDER — GENTAMICIN SULFATE 0.1 % EX CREA: 1.0000 "application " | TOPICAL_CREAM | Freq: Three times a day (TID) | CUTANEOUS | 0 refills | Status: DC

## 2017-05-18 MED ORDER — OMEPRAZOLE 20 MG PO CPDR: 20.0000 mg | DELAYED_RELEASE_CAPSULE | Freq: Every day | ORAL | 3 refills | Status: DC

## 2017-05-18 MED ORDER — PHENTERMINE HCL 30 MG PO CAPS: 30.0000 mg | ORAL_CAPSULE | ORAL | 1 refills | Status: DC

## 2017-05-18 MED ORDER — GABAPENTIN 300 MG PO CAPS: ORAL_CAPSULE | ORAL | 4 refills | Status: DC

## 2017-05-18 MED FILL — GABAPENTIN 300 MG CAPSULE: 300 | 30 days supply | Qty: 120 | Fill #0

## 2017-05-18 MED FILL — ?PANTOPRAZOLE SOD DR 40MG: 40 MG | 30 days supply | Qty: 30 | Fill #0

## 2017-05-18 NOTE — Patient Instructions (Addendum)
Give appointment with RN in 1 month for BP, pulse and weight recheck on Phentermine.   Start phentermine 30 mg daily. We will plan to keep you on this medication for about 3-4 months provided it does not increase in her blood pressure or cause other side effects AND provided you do achieve at least 5% weight loss. Try getting back to the pool for some water aerobics as discussed. Try to cut back on portion sizes and white carbohydrates.   Increase Gabapentin to 300 mg in a.m, 300 mg noon and 600 mg at bedtime.  Use the gentamycin cream on lower legs.  Stop Protonix.  Start Omeprazole instead.  Avoid eating greasy foods, foods with tomato paste with tomato sauce and it, spicy foods and fruit juices.   Phentermine tablets or capsules What is this medicine? PHENTERMINE (FEN ter meen) decreases your appetite. It is used with a reduced calorie diet and exercise to help you lose weight. This medicine may be used for other purposes; ask your health care provider or pharmacist if you have questions. COMMON BRAND NAME(S): Adipex-P, Atti-Plex P, Atti-Plex P Spansule, Fastin, Lomaira, Pro-Fast, Tara-8 What should I tell my health care provider before I take this medicine? They need to know if you have any of these conditions: -agitation -glaucoma -heart disease -high blood pressure -history of substance abuse -lung disease called Primary Pulmonary Hypertension (PPH) -taken an MAOI like Carbex, Eldepryl, Marplan, Nardil, or Parnate in last 14 days -thyroid disease -an unusual or allergic reaction to phentermine, other medicines, foods, dyes, or preservatives -pregnant or trying to get pregnant -breast-feeding How should I use this medicine? Take this medicine by mouth with a glass of water. Follow the directions on the prescription label. The instructions for use may differ based on the product and dose you are taking. Avoid taking this medicine in the evening. It may interfere with sleep. Take your  doses at regular intervals. Do not take your medicine more often than directed. Talk to your pediatrician regarding the use of this medicine in children. While this drug may be prescribed for children 17 years or older for selected conditions, precautions do apply. Overdosage: If you think you have taken too much of this medicine contact a poison control center or emergency room at once. NOTE: This medicine is only for you. Do not share this medicine with others. What if I miss a dose? If you miss a dose, take it as soon as you can. If it is almost time for your next dose, take only that dose. Do not take double or extra doses. What may interact with this medicine? Do not take this medicine with any of the following medications: -duloxetine -MAOIs like Carbex, Eldepryl, Marplan, Nardil, and Parnate -medicines for colds or breathing difficulties like pseudoephedrine or phenylephrine -procarbazine -sibutramine -SSRIs like citalopram, escitalopram, fluoxetine, fluvoxamine, paroxetine, and sertraline -stimulants like dexmethylphenidate, methylphenidate or modafinil -venlafaxine This medicine may also interact with the following medications: -medicines for diabetes This list may not describe all possible interactions. Give your health care provider a list of all the medicines, herbs, non-prescription drugs, or dietary supplements you use. Also tell them if you smoke, drink alcohol, or use illegal drugs. Some items may interact with your medicine. What should I watch for while using this medicine? Notify your physician immediately if you become short of breath while doing your normal activities. Do not take this medicine within 6 hours of bedtime. It can keep you from getting to sleep. Avoid drinks  that contain caffeine and try to stick to a regular bedtime every night. This medicine was intended to be used in addition to a healthy diet and exercise. The best results are achieved this way. This  medicine is only indicated for short-term use. Eventually your weight loss may level out. At that point, the drug will only help you maintain your new weight. Do not increase or in any way change your dose without consulting your doctor. You may get drowsy or dizzy. Do not drive, use machinery, or do anything that needs mental alertness until you know how this medicine affects you. Do not stand or sit up quickly, especially if you are an older patient. This reduces the risk of dizzy or fainting spells. Alcohol may increase dizziness and drowsiness. Avoid alcoholic drinks. What side effects may I notice from receiving this medicine? Side effects that you should report to your doctor or health care professional as soon as possible: -chest pain, palpitations -depression or severe changes in mood -increased blood pressure -irritability -nervousness or restlessness -severe dizziness -shortness of breath -problems urinating -unusual swelling of the legs -vomiting Side effects that usually do not require medical attention (report to your doctor or health care professional if they continue or are bothersome): -blurred vision or other eye problems -changes in sexual ability or desire -constipation or diarrhea -difficulty sleeping -dry mouth or unpleasant taste -headache -nausea This list may not describe all possible side effects. Call your doctor for medical advice about side effects. You may report side effects to FDA at 1-800-FDA-1088. Where should I keep my medicine? Keep out of the reach of children. This medicine can be abused. Keep your medicine in a safe place to protect it from theft. Do not share this medicine with anyone. Selling or giving away this medicine is dangerous and against the law. This medicine may cause accidental overdose and death if taken by other adults, children, or pets. Mix any unused medicine with a substance like cat litter or coffee grounds. Then throw the medicine  away in a sealed container like a sealed bag or a coffee can with a lid. Do not use the medicine after the expiration date. Store at room temperature between 20 and 25 degrees C (68 and 77 degrees F). Keep container tightly closed. NOTE: This sheet is a summary. It may not cover all possible information. If you have questions about this medicine, talk to your doctor, pharmacist, or health care provider.  2018 [employer]vier/Gold Standard (2015-04-10 12:53:15)

## 2017-05-18 NOTE — Progress Notes (Signed)
Patient ID: Courtney Zamora, female    DOB: 09-01-1985  MRN: [id number]  CC: Follow-up   Subjective: Courtney Zamora is a 31 y.o. female who presents for 1 mth f/u. Husband is with her.  Her concerns today include:   Last visit, we discussed why prednisone would not be a viable option for pain management. She has osteoarthritis of both knees We added Cymbalta 30 mg and since then has increased the dose to 60 mg daily through communication with her on My Chart. She was referred to pain clinic, orthopedics/hand surgeon for left wrist pain and P.T . 1. LT Wrist pain: saw ortho 04/2017. Recommended increased Voltaren to TID dosing plus use of Voltaren gel. Referred to the hand specialist for consideration of arthroscopy  2. Morbid obesity:  -she is still interested in trying medication to help with weight loss -saw nutritionist early part of this yr. "I already know what she was telling me."  3. OA knee/Chronic Pain syndrome -referred to P.T on last visit. has appt with P.T 11/7 -still awaiting Pain specialist. -Cymbalta helped initially but now pain worse and having to ambulate with rolling walker.  Not getting in much physical activity -pain worse in a.m and before bedtime.   4. "If I eat anything with just a little bit of grease, I get these nasty burps and lasts for days." Feels Protonix no longer works.   5.  Asthma:  Since last visit, pt has seen pulmonary Dr. Sherene Zamora. Wants to verify that pt taking inhalers correctly. "I don't like him. He don't listen. He likes to talk." Has f/u in 1 mth. She plans to call and inquire about seeing a different pulmonologist  6. C/o small sores on RT lower leg since last visit. Started as small blisters that popped but have not healed completely  Patient Active Problem List   Diagnosis Date Noted  . Acute frontal sinusitis 12/15/2016  . Metatarsal stress fracture, right, sequela 11/17/2016  . Neck muscle spasm 10/05/2016  . Nipple discharge in  female 09/13/2016  . Depression 08/17/2016  . Anxiety 08/17/2016  . ASCUS with positive high risk HPV cervical 08/14/2016  . Hypertension 08/14/2016  . Long term (current) use of systemic steroids 08/14/2016  . Esophageal reflux 12/29/2015  . Morbid (severe) obesity due to excess calories (HCC) 12/29/2015  . Chronic pain of right knee 12/29/2015  . Chronic pain of left ankle 12/29/2015  . 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 Asthma, moderate persistent 12/09/2015     Current Outpatient Prescriptions on File Prior to Visit  Medication Sig Dispense Refill  . acetaminophen-codeine (TYLENOL #3) 300-30 MG tablet Take 1 tablet by mouth every 6 (six) hours as needed for moderate pain. 120 tablet 2  . albuterol (PROVENTIL HFA;VENTOLIN HFA) 108 (90 Base) MCG/ACT inhaler Inhale 2 puffs into the lungs every 6 (six) hours as needed for wheezing or shortness of breath (wheezing). For shortness of breath. 54 Inhaler 0  . albuterol (PROVENTIL) (2.5 MG/3ML) 0.083% nebulizer solution Take 3 mLs (2.5 mg total) by nebulization every 6 (six) hours as needed for wheezing or shortness of breath. 150 mL 1  . Calcium Citrate 200 MG TABS Take 2 tablets (400 mg total) by mouth every other day.    . cetirizine (ZYRTEC) 10 MG tablet Take 1 tablet (10 mg total) by mouth daily. 30 tablet 11  . Cholecalciferol (VITAMIN D3) 5000 units CAPS Take 5,000 Units by mouth once a week.    . diclofenac sodium (  VOLTAREN) 1 % GEL Apply 2 g topically 4 (four) times daily. 3 Tube 3  . DULoxetine (CYMBALTA) 30 MG capsule Take 1 capsule (30 mg total) by mouth 2 (two) times daily. 60 capsule 3  . fluticasone (FLONASE) 50 MCG/ACT nasal spray Place 2 sprays into both nostrils 2 (two) times daily. 16 g 6  . furosemide (LASIX) 20 MG tablet Take 1 tablet (20 mg total) by mouth daily as needed. 12 tablet 2  . hydrochlorothiazide (HYDRODIURIL) 12.5 MG tablet Take 1 tablet (12.5 mg total) by mouth daily. 90 tablet 3  . HYDROcodone-acetaminophen  (NORCO/VICODIN) 5-325 MG tablet Take 1 tablet by mouth every 6 (six) hours as needed for severe pain. 6 tablet 0  . Multiple Vitamins-Minerals (WOMENS MULTI) CAPS Take 1 capsule by mouth daily.    . promethazine (PHENERGAN) 25 MG tablet Take 1 tablet (25 mg total) by mouth every 8 (eight) hours as needed for nausea or vomiting. 2060 tablet 2  . Spacer/Aero-Holding Chambers (AEROCHAMBER MV) inhaler Use as instructed 1 each 0  . tiZANidine (ZANAFLEX) 4 MG tablet TAKE 1 TABLET BY MOUTH EVERY 6 HOURS AS NEEDED FOR MUSCLE SPASMS. 60 tablet 2  . budesonide-formoterol (SYMBICORT) 160-4.5 MCG/ACT inhaler Inhale 2 puffs into the lungs 2 (two) times daily. (Patient not taking: Reported on 05/18/2017) 3 Inhaler 3  . diclofenac (VOLTAREN) 75 MG EC tablet Take 1 tablet (75 mg total) by mouth 2 (two) times daily as needed. (Patient not taking: Reported on 05/18/2017) 60 tablet 2   No current facility-administered medications on file prior to visit.     Allergies  Allergen Reactions  . Bee Venom Anaphylaxis  . Cinnamon Anaphylaxis  . Ibuprofen Nausea Only  . Lexapro [Escitalopram Oxalate]     Generalized body shaking. ? sz  . Tape Hives and Rash    EKG STICKERS give pt rash, hives    Social History   Social History  . Marital status: Married    Spouse name: N/A  . Number of children: N/A  . Years of education: N/A   Occupational History  . Not on file.   Social History Main Topics  . Smoking status: Never Smoker  . Smokeless tobacco: Never Used  . Alcohol use No  . Drug use: No  . Sexual activity: Yes    Birth control/ protection: None   Other Topics Concern  . Not on file   Social History Narrative  . No narrative on file    Family History  Problem Relation Age of Onset  . Hypothyroidism Mother   . Diabetes Father   . Diabetes Paternal Uncle   . Hypertension Maternal Grandmother   . Hypertension Maternal Grandfather   . Pancreatic cancer Maternal Grandfather   . Hypertension  Paternal Grandmother   . Heart disease Paternal Grandfather   . Hypertension Paternal Grandfather     Past Surgical History:  Procedure Laterality Date  . TONSILLECTOMY      ROS: Review of Systems Neg except as stated above  PHYSICAL EXAM: BP 100/66 (BP Location: Right Wrist, Patient Position: Sitting, Cuff Size: Normal)   Pulse 98   Temp 98.1 F (36.7 C) (Oral)   Resp 18   Ht 5\' 4"  (1.626 m)   Wt (!) 401 lb 9.6 oz (182.2 kg)   SpO2 92%   BMI 68.93 kg/m   Wt Readings from Last 3 Encounters:  05/18/17 (!) 401 lb 9.6 oz (182.2 kg)  05/09/17 (!) 399 lb (181 kg)  05/04/17 05/06/17)  399 lb (181 kg)   Physical Exam General appearance - alert, well appearing, and in no distress. Pt has a rollator walker with her. Husband does not acknowledge my presence Mental status - normal mood, behavior, speech, dress, motor activity, and thought processes Skin - legs: large body habitus. Three small circular shallow lesions/ulcers (less than 0.5 cm each) on RT shin  ASSESSMENT AND PLAN: 1. Morbid obesity (HCC) Underscored the importance of small portion size and healthy diet. Encouraged her to try to get back to the pool to get in some form of activity a few times a week. We will try her with phentermine.  Went over possible side effects including elevation in blood pressure, palpitations. We will plan to keep her on the phentermine for about 3-4 months. Follow-up with RN in about 1 month for blood pressure and pulse check - phentermine 30 MG capsule; Take 1 capsule (30 mg total) by mouth every morning.  Dispense: 30 capsule; Refill: 1  2. Pain in left wrist -Await appointment with hand specialist Who recommended keeping oral Voltaren at twice daily dosing   3. Osteoarthritis of both knees, unspecified osteoarthritis type Weight loss strongly recommended. She is on Voltaren and Tylenol with Codeine  4. Moderate persistent asthma with acute exacerbation Continue current inhalers.  Keep  follow-up appointment with pulmonary  5. Gastroesophageal reflux disease without esophagitis GERD precautions discussed.  She admits that some of the foods listed because heartburn for her. Change PPI to omeprazole - omeprazole (PRILOSEC) 20 MG capsule; Take 1 capsule (20 mg total) by mouth daily.  Dispense: 30 capsule; Refill: 3  6. Chronic pain syndrome -Strongly encourage increased physical activity and not rely solely on medications to make pain-free. Await appointment for pain specialist Increase evening dose of gabapentin to 600 mg - gabapentin (NEURONTIN) 300 MG capsule; 1 tab PO Qa.m and noon and 2 tabs Q p.m  Dispense: 120 capsule; Refill: 4  7. Sore on leg - gentamicin cream (GARAMYCIN) 0.1 %; Apply 1 application topically 3 (three) times daily.  Dispense: 15 g; Refill: 0  Declines blood test for DM screen, check on kidney and liver function. Declines flu shot  Patient was given the opportunity to ask questions.  Patient verbalized understanding of the plan and was able to repeat key elements of the plan.   No orders of the defined types were placed in this encounter.    Requested Prescriptions   Signed Prescriptions Disp Refills  . gabapentin (NEURONTIN) 300 MG capsule 120 capsule 4    Sig: 1 tab PO Qa.m and noon and 2 tabs Q p.m  . gentamicin cream (GARAMYCIN) 0.1 % 15 g 0    Sig: Apply 1 application topically 3 (three) times daily.  Marland Kitchen omeprazole (PRILOSEC) 20 MG capsule 30 capsule 3    Sig: Take 1 capsule (20 mg total) by mouth daily.  . phentermine 30 MG capsule 30 capsule 1    Sig: Take 1 capsule (30 mg total) by mouth every morning.    Return in about 2 months (around 07/18/2017).  Jonah Blue, MD, FACP

## 2017-05-18 NOTE — Progress Notes (Signed)
Patient states that both her knee, neck, wrist, and ankle are in pain. Took patient blood pressure in her right forearm.

## 2017-05-22 ENCOUNTER — Encounter: Payer: Self-pay | Admitting: Internal Medicine

## 2017-05-23 ENCOUNTER — Other Ambulatory Visit: Payer: Self-pay

## 2017-05-23 ENCOUNTER — Encounter (HOSPITAL_COMMUNITY): Payer: Self-pay | Admitting: Emergency Medicine

## 2017-05-23 ENCOUNTER — Encounter: Payer: Self-pay | Admitting: Internal Medicine

## 2017-05-23 ENCOUNTER — Encounter: Payer: Self-pay | Admitting: Sports Medicine

## 2017-05-23 ENCOUNTER — Ambulatory Visit (HOSPITAL_COMMUNITY)
Admission: EM | Admit: 2017-05-23 | Discharge: 2017-05-23 | Disposition: A | Discharge status: Home / Self Care | Payer: Self-pay | Attending: Emergency Medicine | Admitting: Emergency Medicine

## 2017-05-23 ENCOUNTER — Ambulatory Visit (INDEPENDENT_AMBULATORY_CARE_PROVIDER_SITE_OTHER): Payer: Self-pay

## 2017-05-23 DIAGNOSIS — M25562 Pain in left knee: Secondary | ICD-10-CM

## 2017-05-23 DIAGNOSIS — G8929 Other chronic pain: Secondary | ICD-10-CM

## 2017-05-23 MED ORDER — PREDNISONE 10 MG (21) PO TBPK: ORAL_TABLET | Freq: Every day | ORAL | 0 refills | Status: DC

## 2017-05-23 NOTE — Discharge Instructions (Signed)
Xray showed degenerative changes and small joint effusion. Prednisone taper as directed. Continue diclofenac by mouth, start applying diclofenac topical on knee as well. Knee sleeve during activity. Ice compress and elevation. Follow up with orthopedics/sports medicine for further evaluation and treatment needed.

## 2017-05-23 NOTE — ED Triage Notes (Signed)
Pt sts left knee pain chronic in nature worse over last 3 days

## 2017-05-23 NOTE — ED Provider Notes (Signed)
[facility]    CSN: [id number] Arrival date & time: 05/23/17  1553     History   Chief Complaint Chief Complaint  Patient presents with  . Knee Pain    HPI Courtney Zamora is a 31 y.o. female.   31 year old female with history of anxiety, asthma, depression, obesity, seizures, chronic pain comes in for 3 day history of acute on chronic left knee pain. Patient states she fractured the left knee in 2014 and has had intermittent left knee pain since. Patient denies recent injury to cause the pain. States she has been not been able to move her knee, straighten knee, bear weight. She is on tylenol #3, cymbalta, gabapentin, diclofenac PO/topical for pain. She has noticed some swelling in her knees as well. She has known torn meniscus in both knees and is followed by orthopedics/sports medicine for her knees/wrist. Denies history of DM.       Past Medical History:  Diagnosis Date  . Anxiety   . Asthma   . Depression   . Obesity   . Obesity   . Seizures (HCC)   . Severe needle phobia     Patient Active Problem List   Diagnosis Date Noted  . Acute frontal sinusitis 12/15/2016  . Metatarsal stress fracture, right, sequela 11/17/2016  . Neck muscle spasm 10/05/2016  . Nipple discharge in female 09/13/2016  . Depression 08/17/2016  . Anxiety 08/17/2016  . ASCUS with positive high risk HPV cervical 08/14/2016  . Hypertension 08/14/2016  . Long term (current) use of systemic steroids 08/14/2016  . Esophageal reflux 12/29/2015  . Morbid (severe) obesity due to excess calories (HCC) 12/29/2015  . Chronic pain of right knee 12/29/2015  . Chronic pain of left ankle 12/29/2015  . 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 Asthma, moderate persistent 12/09/2015    Past Surgical History:  Procedure Laterality Date  . TONSILLECTOMY      OB History    Gravida Para Term Preterm AB Living   1       1 0   SAB TAB Ectopic Multiple Live Births   1               Home  Medications    Prior to Admission medications   Medication Sig Start Date End Date Taking? Authorizing Provider  acetaminophen-codeine (TYLENOL #3) 300-30 MG tablet Take 1 tablet by mouth every 6 (six) hours as needed for moderate pain. 04/11/17   Marcine Matar, MD  albuterol (PROVENTIL HFA;VENTOLIN HFA) 108 (90 Base) MCG/ACT inhaler Inhale 2 puffs into the lungs every 6 (six) hours as needed for wheezing or shortness of breath (wheezing). For shortness of breath. 04/06/17   Marcine Matar, MD  albuterol (PROVENTIL) (2.5 MG/3ML) 0.083% nebulizer solution Take 3 mLs (2.5 mg total) by nebulization every 6 (six) hours as needed for wheezing or shortness of breath. 11/17/16   Funches, Gerilyn Nestle, MD  budesonide-formoterol (SYMBICORT) 160-4.5 MCG/ACT inhaler Inhale 2 puffs into the lungs 2 (two) times daily. Patient not taking: Reported on 05/18/2017 03/18/16   Dessa Phi, MD  Calcium Citrate 200 MG TABS Take 2 tablets (400 mg total) by mouth every other day. 10/05/16   Newt Lukes, MD  cetirizine (ZYRTEC) 10 MG tablet Take 1 tablet (10 mg total) by mouth daily. 11/22/16   Storm Frisk, MD  Cholecalciferol (VITAMIN D3) 5000 units CAPS Take 5,000 Units by mouth once a week.    [provider]  diclofenac (  VOLTAREN) 75 MG EC tablet Take 1 tablet (75 mg total) by mouth 2 (two) times daily as needed. Patient not taking: Reported on 05/18/2017 05/10/17   Kerrin Champagne, MD  diclofenac sodium (VOLTAREN) 1 % GEL Apply 2 g topically 4 (four) times daily. 05/04/17   Kerrin Champagne, MD  DULoxetine (CYMBALTA) 30 MG capsule Take 1 capsule (30 mg total) by mouth 2 (two) times daily. 04/16/17   Marcine Matar, MD  fluticasone (FLONASE) 50 MCG/ACT nasal spray Place 2 sprays into both nostrils 2 (two) times daily. 07/06/16   Storm Frisk, MD  furosemide (LASIX) 20 MG tablet Take 1 tablet (20 mg total) by mouth daily as needed. 04/10/17   Marcine Matar, MD  gabapentin (NEURONTIN) 300  MG capsule 1 tab PO Qa.m and noon and 2 tabs Q p.m 05/18/17   Marcine Matar, MD  gentamicin cream (GARAMYCIN) 0.1 % Apply 1 application topically 3 (three) times daily. 05/18/17   Marcine Matar, MD  hydrochlorothiazide (HYDRODIURIL) 12.5 MG tablet Take 1 tablet (12.5 mg total) by mouth daily. 09/13/16   Funches, Gerilyn Nestle, MD  HYDROcodone-acetaminophen (NORCO/VICODIN) 5-325 MG tablet Take 1 tablet by mouth every 6 (six) hours as needed for severe pain. 05/10/17   Bethel Born, PA-C  Multiple Vitamins-Minerals (WOMENS MULTI) CAPS Take 1 capsule by mouth daily.    [provider]  omeprazole (PRILOSEC) 20 MG capsule Take 1 capsule (20 mg total) by mouth daily. 05/18/17   Marcine Matar, MD  phentermine 30 MG capsule Take 1 capsule (30 mg total) by mouth every morning. 05/18/17   Marcine Matar, MD  predniSONE (STERAPRED UNI-PAK 21 TAB) 10 MG (21) TBPK tablet Take daily by mouth. Take 6 tabs by mouth day 1, then 5 tabs, then 4 tabs, then 3 tabs, 2 tabs, then 1 tab for the last day 05/23/17   Belinda Fisher, PA-C  promethazine (PHENERGAN) 25 MG tablet Take 1 tablet (25 mg total) by mouth every 8 (eight) hours as needed for nausea or vomiting. 11/17/16   Dessa Phi, MD  Spacer/Aero-Holding Chambers (AEROCHAMBER MV) inhaler Use as instructed 01/25/17   Storm Frisk, MD  tiZANidine (ZANAFLEX) 4 MG tablet TAKE 1 TABLET BY MOUTH EVERY 6 HOURS AS NEEDED FOR MUSCLE SPASMS. 04/11/17   Marcine Matar, MD    Family History Family History  Problem Relation Age of Onset  . Hypothyroidism Mother   . Diabetes Father   . Diabetes Paternal Uncle   . Hypertension Maternal Grandmother   . Hypertension Maternal Grandfather   . Pancreatic cancer Maternal Grandfather   . Hypertension Paternal Grandmother   . Heart disease Paternal Grandfather   . Hypertension Paternal Grandfather     Social History Social History   Tobacco Use  . Smoking status: Never Smoker  . Smokeless  tobacco: Never Used  Substance Use Topics  . Alcohol use: No  . Drug use: No     Allergies   Bee venom; Cinnamon; Ibuprofen; Lexapro [escitalopram oxalate]; and Tape   Review of Systems Review of Systems  Reason unable to perform ROS: See HPI as above.     Physical Exam Triage Vital Signs ED Triage Vitals [05/23/17 1618]  Enc Vitals Group     BP (!) 143/77     Pulse Rate 98     Resp 18     Temp 98.2 F (36.8 C)     Temp Source Oral     SpO2  100 %     Weight      Height      Head Circumference      Peak Flow      Pain Score 9     Pain Loc      Pain Edu?      Excl. in GC?    No data found.  Updated Vital Signs BP (!) 143/77 (BP Location: Right Arm)   Pulse 98   Temp 98.2 F (36.8 C) (Oral)   Resp 18   SpO2 100%   Physical Exam  Constitutional: She is oriented to person, place, and time. She appears well-developed and well-nourished. No distress.  HENT:  Head: Normocephalic and atraumatic.  Eyes: Conjunctivae are normal. Pupils are equal, round, and reactive to light.  Musculoskeletal:  Exam limited due to body habitus. Swelling of the left knee. Tenderness on palpation of the medial joint line and posterior knee. Decreased extension of knee. Strength normal though with pain. Sensation intact. Pedal pulses 2+ and equal bilaterally.   Neurological: She is alert and oriented to person, place, and time.     UC Treatments / Results  Labs (all labs ordered are listed, but only abnormal results are displayed) Labs Reviewed - No data to display  EKG  EKG Interpretation None       Radiology Dg Knee Complete 4 Views Left  Result Date: 05/23/2017 CLINICAL DATA:  Pt unable to straighten knee x 4 days. Previous fx in 2014. Pain focused around posterior aspect of knee. Pain also sub patellar area. Pt states she heard it pop a couple days ago. Pt unable to bear weight, fall risk could not do standing. EXAM: LEFT KNEE - COMPLETE 4+ VIEW COMPARISON:  None.  FINDINGS: There is significant degenerative change involving the medial, lateral, and patellofemoral compartments. Small joint effusion is present. No acute fracture or subluxation. IMPRESSION: Degenerative changes and small joint effusion. Electronically Signed   By: Norva Pavlov M.D.   On: 05/23/2017 16:56    Procedures Procedures (including critical care time)  Medications Ordered in UC Medications - No data to display   Initial Impression / Assessment and Plan / UC Course  I have reviewed the triage vital signs and the nursing notes.  Pertinent labs & imaging results that were available during my care of the patient were reviewed by me and considered in my medical decision making (see chart for details).    Xray with small joint effusion and degenerative changes. Continue pain medications, start prednisone as directed. Ice compress, elevation, ace wrap. Follow up with orthopedics/sports medicine for further evaluation of knee pain. Return precautions given. Patient expresses understanding and agrees to plan.   Final Clinical Impressions(s) / UC Diagnoses   Final diagnoses:  Chronic pain of left knee    ED Discharge Orders        Ordered    predniSONE (STERAPRED UNI-PAK 21 TAB) 10 MG (21) TBPK tablet  Daily     05/23/17 1744        Belinda Fisher, PA-C 05/23/17 1914

## 2017-05-24 ENCOUNTER — Other Ambulatory Visit: Payer: Self-pay | Admitting: Internal Medicine

## 2017-05-24 ENCOUNTER — Ambulatory Visit: Payer: Self-pay | Admitting: Physical Therapy

## 2017-05-24 DIAGNOSIS — M1712 Unilateral primary osteoarthritis, left knee: Secondary | ICD-10-CM

## 2017-05-24 NOTE — Progress Notes (Signed)
Received Mychart message from pt regarding worsening pain in LT knee. Difficulty ambulating. Pt seen in UC yesterday. Will refer to ortho.

## 2017-05-25 ENCOUNTER — Telehealth: Payer: Self-pay | Admitting: Internal Medicine

## 2017-05-25 NOTE — Telephone Encounter (Signed)
Pt has been scheduled from 06/19/17 with PM.  Pt is aware and voiced her understanding.  Nothing further needed

## 2017-05-25 NOTE — Progress Notes (Signed)
Good Afternoon  Dr Laural Benes  Patient has an appointment with PT 11 /16 and with [facility] 11/28  Your welcome

## 2017-05-25 NOTE — Telephone Encounter (Signed)
OK with me.

## 2017-05-25 NOTE — Telephone Encounter (Signed)
PM- you have the first available do you wish to take over her care? Please advise thanks

## 2017-05-25 NOTE — Telephone Encounter (Signed)
Fine with me

## 2017-05-25 NOTE — Telephone Encounter (Signed)
RB next available appt is 06/16/2017.  Mannam 05/30/2017  MW ok to switch, then I can ask PM or RB is they would take over her care.

## 2017-05-26 ENCOUNTER — Other Ambulatory Visit: Payer: Self-pay | Admitting: Internal Medicine

## 2017-05-26 DIAGNOSIS — G8929 Other chronic pain: Secondary | ICD-10-CM

## 2017-05-26 DIAGNOSIS — M25561 Pain in right knee: Principal | ICD-10-CM

## 2017-05-26 DIAGNOSIS — M25572 Pain in left ankle and joints of left foot: Secondary | ICD-10-CM

## 2017-05-29 ENCOUNTER — Ambulatory Visit: Payer: Self-pay | Admitting: Internal Medicine

## 2017-05-30 ENCOUNTER — Other Ambulatory Visit: Payer: Self-pay | Admitting: Internal Medicine

## 2017-05-30 DIAGNOSIS — G8929 Other chronic pain: Secondary | ICD-10-CM

## 2017-05-30 DIAGNOSIS — M25561 Pain in right knee: Principal | ICD-10-CM

## 2017-05-30 DIAGNOSIS — M25572 Pain in left ankle and joints of left foot: Secondary | ICD-10-CM

## 2017-05-30 MED FILL — ?HYDROCHLOROTHIAZIDE 12.5MG: 12.5 | 30 days supply | Qty: 30 | Fill #9

## 2017-05-30 MED FILL — GENTAMICIN 0.1% CREAM: 0.1 | 5 days supply | Qty: 15 | Fill #0

## 2017-06-01 ENCOUNTER — Ambulatory Visit: Payer: Self-pay | Admitting: Sports Medicine

## 2017-06-02 ENCOUNTER — Ambulatory Visit: Payer: Self-pay | Attending: Internal Medicine | Admitting: Physical Therapy

## 2017-06-02 ENCOUNTER — Encounter: Payer: Self-pay | Admitting: Physical Therapy

## 2017-06-05 ENCOUNTER — Ambulatory Visit (INDEPENDENT_AMBULATORY_CARE_PROVIDER_SITE_OTHER): Payer: Self-pay | Admitting: Sports Medicine

## 2017-06-05 ENCOUNTER — Encounter: Payer: Self-pay | Admitting: Sports Medicine

## 2017-06-05 VITALS — BP 146/106 | Ht 64.0 in | Wt >= 6400 oz

## 2017-06-05 DIAGNOSIS — M1732 Unilateral post-traumatic osteoarthritis, left knee: Secondary | ICD-10-CM

## 2017-06-05 MED ORDER — NABUMETONE 500 MG PO TABS: ORAL_TABLET | ORAL | 1 refills | Status: DC

## 2017-06-05 MED FILL — NABUMETONE 500 MG TABLET: 500 | 30 days supply | Qty: 60 | Fill #0

## 2017-06-05 NOTE — Progress Notes (Signed)
   Subjective:    Patient ID: Courtney Zamora, female    DOB: March 18, 1986, 31 y.o.   MRN: [id number]  HPI chief complaint: Left knee pain  Patient comes in today complaining of left knee pain. She's had left knee pain for several years but a few weeks ago awoke with the knee locked. She was seen in the emergency room. X-rays show advanced tricompartmental degenerative changes. She was placed on prednisone which has been helpful. She denies any previous knee surgeries but did suffer a knee fracture in 2014. Her PCP had her on chronic prednisone up until recently. She is now taking diclofenac as well as Cymbalta, gabapentin, and Zanaflex. She has tried meloxicam and Celebrex in the past without much benefit. She is asking about the possibility of changing her diclofenac to something different.  Interim medical history reviewed Medications reviewed Allergies reviewed    Review of Systems    as above Objective:   Physical Exam  Obese. No acute distress sitting comfortably in exam room  Left knee: Range of motion 0-90. Body habitus makes it difficult to evaluate for possible effusion. There is some fullness in the popliteal fossa consistent with possible Baker's cyst tear. Knee is grossly stable ligamentous exam. She is tender to palpation along medial and lateral joint lines. Neurovascularly intact distally.  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 She is an established patient of Dr. Otelia Sergeant at [facility] and has an appointment with him later this month. She will discuss her knee predicament with him. She may need something surgical. In the meantime, I will change her diclofenac to Relafen 500 mg with instructions to take it twice daily with food. Continue on all other medications and follow-up with me as needed.

## 2017-06-06 ENCOUNTER — Encounter: Payer: Self-pay | Admitting: Internal Medicine

## 2017-06-06 NOTE — Telephone Encounter (Signed)
Scheduled for PT eval on 05/25/27, pt rescheduled for 06/02/17, then no showed.

## 2017-06-07 ENCOUNTER — Other Ambulatory Visit: Payer: Self-pay | Admitting: Internal Medicine

## 2017-06-07 DIAGNOSIS — M25572 Pain in left ankle and joints of left foot: Secondary | ICD-10-CM

## 2017-06-07 DIAGNOSIS — M25561 Pain in right knee: Principal | ICD-10-CM

## 2017-06-07 DIAGNOSIS — G8929 Other chronic pain: Secondary | ICD-10-CM

## 2017-06-12 ENCOUNTER — Ambulatory Visit (HOSPITAL_COMMUNITY)
Admission: EM | Admit: 2017-06-12 | Discharge: 2017-06-12 | Disposition: A | Discharge status: Home / Self Care | Payer: No Typology Code available for payment source | Attending: Physician Assistant | Admitting: Physician Assistant

## 2017-06-12 ENCOUNTER — Other Ambulatory Visit: Payer: Self-pay

## 2017-06-12 ENCOUNTER — Encounter (HOSPITAL_COMMUNITY): Payer: Self-pay | Admitting: Emergency Medicine

## 2017-06-12 DIAGNOSIS — T7840XA Allergy, unspecified, initial encounter: Secondary | ICD-10-CM

## 2017-06-12 DIAGNOSIS — L509 Urticaria, unspecified: Secondary | ICD-10-CM

## 2017-06-12 MED ORDER — PREDNISONE 20 MG PO TABS
60.0000 mg | ORAL_TABLET | Freq: Once | ORAL | Status: AC
  Administered 2017-06-12: 60 mg via ORAL

## 2017-06-12 MED ORDER — DIPHENHYDRAMINE HCL 25 MG PO CAPS: 25.0000 mg | ORAL_CAPSULE | Freq: Four times a day (QID) | ORAL | 0 refills | Status: DC | PRN

## 2017-06-12 MED ORDER — PREDNISONE 20 MG PO TABS
ORAL_TABLET | ORAL | Status: AC
  Filled 2017-06-12: qty 3

## 2017-06-12 MED ORDER — FAMOTIDINE 20 MG PO TABS
20.0000 mg | ORAL_TABLET | Freq: Once | ORAL | Status: AC
Start: 2017-06-12 — End: 2017-06-12
  Administered 2017-06-12: 20 mg via ORAL

## 2017-06-12 MED ORDER — FAMOTIDINE 20 MG PO TABS: 20.0000 mg | ORAL_TABLET | Freq: Two times a day (BID) | ORAL | 0 refills | Status: DC

## 2017-06-12 MED ORDER — PREDNISONE 10 MG PO TABS: 40.0000 mg | ORAL_TABLET | Freq: Every day | ORAL | 0 refills | Status: DC

## 2017-06-12 MED ORDER — DIPHENHYDRAMINE HCL 25 MG PO CAPS
50.0000 mg | ORAL_CAPSULE | Freq: Once | ORAL | Status: AC
  Administered 2017-06-12: 50 mg via ORAL

## 2017-06-12 MED ORDER — PREDNISONE 10 MG PO TABS: 40.0000 mg | ORAL_TABLET | Freq: Every day | ORAL | 0 refills | Status: AC

## 2017-06-12 MED ORDER — FAMOTIDINE 20 MG PO TABS
ORAL_TABLET | ORAL | Status: AC
  Filled 2017-06-12: qty 1

## 2017-06-12 MED ORDER — DIPHENHYDRAMINE HCL 25 MG PO CAPS
ORAL_CAPSULE | ORAL | Status: AC
  Filled 2017-06-12: qty 2

## 2017-06-12 MED ORDER — DIPHENHYDRAMINE HCL 12.5 MG/5ML PO ELIX
ORAL_SOLUTION | ORAL | Status: AC
  Filled 2017-06-12: qty 20

## 2017-06-12 NOTE — ED Provider Notes (Signed)
[facility]    CSN: [id number] Arrival date & time: 06/12/17  1739     History   Chief Complaint Chief Complaint  Patient presents with  . Allergic Reaction    HPI Courtney Zamora is a 31 y.o. female.   31 year-old female, presenting today complaining of possible allergic reaction. States that she was recently started on Relafen by ortho about a week ago. The next day, she noticed rash to her left leg. Since that time, the rash has spread. She has had "difficulty swallowing." She states she feels as though she has shallow respirations  Denies sensation of throat or tongue swelling, wheezing  Has tried benadryl and rubbing alcohol at home    The history is provided by the patient.  Allergic Reaction  Presenting symptoms: difficulty swallowing, itching and rash   Severity:  Mild Duration:  7 days Prior allergic episodes:  No prior episodes Context: medications   Context: not animal exposure, not chemicals, not cosmetics, not dairy/milk products and not eggs   Relieved by:  Nothing Worsened by:  Nothing Ineffective treatments:  Antihistamines   Past Medical History:  Diagnosis Date  . Anxiety   . Asthma   . Depression   . Obesity   . Obesity   . Seizures (HCC)   . Severe needle phobia     Patient Active Problem List   Diagnosis Date Noted  . Acute frontal sinusitis 12/15/2016  . Metatarsal stress fracture, right, sequela 11/17/2016  . Neck muscle spasm 10/05/2016  . Nipple discharge in female 09/13/2016  . Depression 08/17/2016  . Anxiety 08/17/2016  . ASCUS with positive high risk HPV cervical 08/14/2016  . Hypertension 08/14/2016  . Long term (current) use of systemic steroids 08/14/2016  . Esophageal reflux 12/29/2015  . Morbid (severe) obesity due to excess calories (HCC) 12/29/2015  . Chronic pain of right knee 12/29/2015  . Chronic pain of left ankle 12/29/2015  . 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 Asthma, moderate persistent 12/09/2015      Past Surgical History:  Procedure Laterality Date  . TONSILLECTOMY      OB History    Gravida Para Term Preterm AB Living   1       1 0   SAB TAB Ectopic Multiple Live Births   1               Home Medications    Prior to Admission medications   Medication Sig Start Date End Date Taking? Authorizing Provider  acetaminophen-codeine (TYLENOL #3) 300-30 MG tablet Take 1 tablet by mouth every 6 (six) hours as needed for moderate pain. 04/11/17   Marcine Matar, MD  albuterol (PROVENTIL HFA;VENTOLIN HFA) 108 (90 Base) MCG/ACT inhaler Inhale 2 puffs into the lungs every 6 (six) hours as needed for wheezing or shortness of breath (wheezing). For shortness of breath. 04/06/17   Marcine Matar, MD  albuterol (PROVENTIL) (2.5 MG/3ML) 0.083% nebulizer solution Take 3 mLs (2.5 mg total) by nebulization every 6 (six) hours as needed for wheezing or shortness of breath. 11/17/16   Funches, Gerilyn Nestle, MD  budesonide-formoterol (SYMBICORT) 160-4.5 MCG/ACT inhaler Inhale 2 puffs into the lungs 2 (two) times daily. Patient not taking: Reported on 05/18/2017 03/18/16   Dessa Phi, MD  Calcium Citrate 200 MG TABS Take 2 tablets (400 mg total) by mouth every other day. 10/05/16   Newt Lukes, MD  cetirizine (ZYRTEC) 10 MG tablet Take 1 tablet (10 mg total) by mouth  daily. 11/22/16   Storm Frisk, MD  Cholecalciferol (VITAMIN D3) 5000 units CAPS Take 5,000 Units by mouth once a week.    [provider]  diclofenac (VOLTAREN) 75 MG EC tablet Take 1 tablet (75 mg total) by mouth 2 (two) times daily as needed. Patient not taking: Reported on 05/18/2017 05/10/17   Kerrin Champagne, MD  diclofenac sodium (VOLTAREN) 1 % GEL Apply 2 g topically 4 (four) times daily. 05/04/17   Kerrin Champagne, MD  diphenhydrAMINE (BENADRYL) 25 mg capsule Take 1 capsule (25 mg total) by mouth every 6 (six) hours as needed. 06/12/17   Hershy Flenner C, PA-C  DULoxetine (CYMBALTA) 30 MG capsule Take 1 capsule  (30 mg total) by mouth 2 (two) times daily. 04/16/17   Marcine Matar, MD  famotidine (PEPCID) 20 MG tablet Take 1 tablet (20 mg total) by mouth 2 (two) times daily. 06/12/17   Lynanne Delgreco C, PA-C  fluticasone (FLONASE) 50 MCG/ACT nasal spray Place 2 sprays into both nostrils 2 (two) times daily. 07/06/16   Storm Frisk, MD  furosemide (LASIX) 20 MG tablet Take 1 tablet (20 mg total) by mouth daily as needed. 04/10/17   Marcine Matar, MD  gabapentin (NEURONTIN) 300 MG capsule 1 tab PO Qa.m and noon and 2 tabs Q p.m 05/18/17   Marcine Matar, MD  gentamicin cream (GARAMYCIN) 0.1 % Apply 1 application topically 3 (three) times daily. 05/18/17   Marcine Matar, MD  hydrochlorothiazide (HYDRODIURIL) 12.5 MG tablet Take 1 tablet (12.5 mg total) by mouth daily. 09/13/16   Funches, Gerilyn Nestle, MD  HYDROcodone-acetaminophen (NORCO/VICODIN) 5-325 MG tablet Take 1 tablet by mouth every 6 (six) hours as needed for severe pain. 05/10/17   Bethel Born, PA-C  Multiple Vitamins-Minerals (WOMENS MULTI) CAPS Take 1 capsule by mouth daily.    [provider]  omeprazole (PRILOSEC) 20 MG capsule Take 1 capsule (20 mg total) by mouth daily. 05/18/17   Marcine Matar, MD  phentermine 30 MG capsule Take 1 capsule (30 mg total) by mouth every morning. 05/18/17   Marcine Matar, MD  predniSONE (DELTASONE) 10 MG tablet Take 4 tablets (40 mg total) by mouth daily for 5 days. 06/12/17 06/17/17  Kamica Florance C, PA-C  promethazine (PHENERGAN) 25 MG tablet Take 1 tablet (25 mg total) by mouth every 8 (eight) hours as needed for nausea or vomiting. 11/17/16   Dessa Phi, MD  Spacer/Aero-Holding Chambers (AEROCHAMBER MV) inhaler Use as instructed 01/25/17   Storm Frisk, MD  tiZANidine (ZANAFLEX) 4 MG tablet TAKE 1 TABLET BY MOUTH EVERY 6 HOURS AS NEEDED FOR MUSCLE SPASMS. 06/10/17   Marcine Matar, MD    Family History Family History  Problem Relation Age of Onset  .  Hypothyroidism Mother   . Diabetes Father   . Diabetes Paternal Uncle   . Hypertension Maternal Grandmother   . Hypertension Maternal Grandfather   . Pancreatic cancer Maternal Grandfather   . Hypertension Paternal Grandmother   . Heart disease Paternal Grandfather   . Hypertension Paternal Grandfather     Social History Social History   Tobacco Use  . Smoking status: Never Smoker  . Smokeless tobacco: Never Used  Substance Use Topics  . Alcohol use: No  . Drug use: No     Allergies   Bee venom; Cinnamon; Ibuprofen; Lexapro [escitalopram oxalate]; and Tape   Review of Systems Review of Systems  Constitutional: Negative for chills and fever.  HENT: Positive for trouble swallowing. Negative for ear pain and sore throat.   Eyes: Negative for pain and visual disturbance.  Respiratory: Positive for shortness of breath. Negative for cough.   Cardiovascular: Negative for chest pain and palpitations.  Gastrointestinal: Negative for abdominal pain and vomiting.  Genitourinary: Negative for dysuria and hematuria.  Musculoskeletal: Negative for arthralgias and back pain.  Skin: Positive for itching and rash. Negative for color change.  Neurological: Negative for seizures and syncope.  All other systems reviewed and are negative.    Physical Exam Triage Vital Signs ED Triage Vitals  Enc Vitals Group     BP 06/12/17 1753 (!) 157/73     Pulse Rate 06/12/17 1753 (!) 110     Resp 06/12/17 1753 (!) 24     Temp 06/12/17 1753 98.1 F (36.7 C)     Temp src --      SpO2 06/12/17 1753 100 %     Weight --      Height --      Head Circumference --      Peak Flow --      Pain Score 06/12/17 1750 8     Pain Loc --      Pain Edu? --      Excl. in GC? --    No data found.  Updated Vital Signs BP (!) 157/73 (BP Location: Left Arm) Comment (BP Location): regular on left forearm  Pulse (!) 110   Temp 98.1 F (36.7 C)   Resp (!) 24   SpO2 100%   Visual Acuity Right Eye  Distance:   Left Eye Distance:   Bilateral Distance:    Right Eye Near:   Left Eye Near:    Bilateral Near:     Physical Exam  Constitutional: She appears well-developed and well-nourished. No distress.  HENT:  Head: Normocephalic and atraumatic.  Right Ear: Hearing, tympanic membrane, external ear and ear canal normal.  Left Ear: Hearing, tympanic membrane and external ear normal.  Mouth/Throat: No uvula swelling.  No appreciated edema of the oropharynx  Eyes: Conjunctivae are normal.  Neck: Neck supple.  Cardiovascular: Normal rate and regular rhythm.  No murmur heard. Pulmonary/Chest: Effort normal and breath sounds normal. No stridor. No respiratory distress. She has no decreased breath sounds. She has no wheezes. She has no rhonchi. She has no rales.  Abdominal: Soft. There is no tenderness.  Musculoskeletal: She exhibits no edema.  Neurological: She is alert.  Skin: Skin is warm and dry. Rash noted. Rash is maculopapular and urticarial.  Fine erythematous maculopapular rash to the lower extremities  Urticarial rash to the face   Psychiatric: She has a normal mood and affect.  Nursing note and vitals reviewed.    UC Treatments / Results  Labs (all labs ordered are listed, but only abnormal results are displayed) Labs Reviewed - No data to display  EKG  EKG Interpretation None       Radiology No results found.  Procedures Procedures (including critical care time)  Medications Ordered in UC Medications  predniSONE (DELTASONE) tablet 60 mg (60 mg Oral Given 06/12/17 1850)  diphenhydrAMINE (BENADRYL) capsule 50 mg (50 mg Oral Given 06/12/17 1846)  famotidine (PEPCID) tablet 20 mg (20 mg Oral Given 06/12/17 1847)     Initial Impression / Assessment and Plan / UC Course  I have reviewed the triage vital signs and the nursing notes.  Pertinent labs & imaging results that were available during my care of the  patient were reviewed by me and considered in my  medical decision making (see chart for details).     Feeling better after prednisone, benadryl and pepcid  Will follow-up with PCP and stop Relafen Return precautions discussed   Final Clinical Impressions(s) / UC Diagnoses   Final diagnoses:  Allergic reaction, initial encounter  Urticaria    ED Discharge Orders        Ordered    predniSONE (DELTASONE) 10 MG tablet  Daily,   Status:  Discontinued     06/12/17 1931    diphenhydrAMINE (BENADRYL) 25 mg capsule  Every 6 hours PRN,   Status:  Discontinued     06/12/17 1931    famotidine (PEPCID) 20 MG tablet  2 times daily,   Status:  Discontinued     06/12/17 1931    diphenhydrAMINE (BENADRYL) 25 mg capsule  Every 6 hours PRN     06/12/17 1931    famotidine (PEPCID) 20 MG tablet  2 times daily     06/12/17 1931    predniSONE (DELTASONE) 10 MG tablet  Daily     06/12/17 1931       Controlled Substance Prescriptions [place] Controlled Substance Registry consulted? Not Applicable   Alecia Lemming, [place] 06/12/17 1942

## 2017-06-12 NOTE — ED Triage Notes (Signed)
Symptoms were the worst around 11:00 am., today.  Patient woke at this time.  On waking had difficulty swallowing, legs itching, hard to breathe.    No change in symptoms since onset.  Patient did take a 3 hour nap after onset of symptoms.

## 2017-06-13 ENCOUNTER — Encounter: Payer: Self-pay | Admitting: Internal Medicine

## 2017-06-13 MED FILL — ?DULOXETINE HCL DR 30 MG CA: 30 MG | 30 days supply | Qty: 60 | Fill #2

## 2017-06-13 MED FILL — GABAPENTIN 300 MG CAPSULE: 300 | 30 days supply | Qty: 120 | Fill #1

## 2017-06-13 MED FILL — ?CETIRIZINE HCL 10 MG TABLE: 10 | 30 days supply | Qty: 30 | Fill #6

## 2017-06-13 MED FILL — ?PANTOPRAZOLE SOD DR 40MG: 40 MG | 30 days supply | Qty: 30 | Fill #1

## 2017-06-13 MED FILL — ?DICLOFENAC SOD DR 75 MG TA: 75 | 30 days supply | Qty: 60 | Fill #2

## 2017-06-13 MED FILL — ACETAMINOPHEN/COD #3 TABLET: 300-30 | 30 days supply | Qty: 120 | Fill #2

## 2017-06-14 ENCOUNTER — Ambulatory Visit (INDEPENDENT_AMBULATORY_CARE_PROVIDER_SITE_OTHER): Payer: Self-pay | Admitting: Specialist

## 2017-06-19 ENCOUNTER — Ambulatory Visit: Payer: Self-pay

## 2017-06-19 ENCOUNTER — Encounter: Payer: Self-pay | Admitting: Pulmonary Disease

## 2017-06-19 ENCOUNTER — Ambulatory Visit (INDEPENDENT_AMBULATORY_CARE_PROVIDER_SITE_OTHER): Payer: No Typology Code available for payment source | Admitting: Pulmonary Disease

## 2017-06-19 VITALS — BP 132/60 | HR 109 | Ht 64.0 in | Wt 394.8 lb

## 2017-06-19 DIAGNOSIS — R400 Somnolence: Secondary | ICD-10-CM

## 2017-06-19 DIAGNOSIS — J4540 Moderate persistent asthma, uncomplicated: Secondary | ICD-10-CM

## 2017-06-19 LAB — POCT EXHALED NITRIC OXIDE: FeNO level (ppb): 5

## 2017-06-19 MED FILL — tiZANidine HCL 4 MG TABS: 4 | 15 days supply | Qty: 60 | Fill #0

## 2017-06-19 NOTE — Progress Notes (Signed)
Courtney Zamora    [id number]    16-Oct-1985  Primary Care Physician:Johnson, Binnie Raileborah B, MD  Referring Physician: Marcine MatarJohnson, Deborah B, MD [address] [place], [place] [address]  Chief complaint: Consult for evaluation of asthma  HPI: 31 year old with history of anxiety, asthma, depression, obesity, degenerative joint disease.  She has a diagnosis of exercise-induced asthma childhood asthma.  She has difficulties when seasons change.  Sensitive to strong perfumes, cinnamon, cleaning with liquids.  Has complains of dyspnea with activity for the past few months.  She denies dyspnea at rest or wheezing.  No cough, sputum production.  No significant problems with snoring.  Denies daytime sleepiness. She has been on Symbicort for the past 2 years but cannot tell if it is helping.  She has albuterol inhaler which she uses occasionally.  Pets: Has a menagerie at home including snakes, lizards, hedgehogs, ferrets, dogs Occupation: Currently unemployed Exposures: No relevant exposure, no mold at home Smoking history: non smoker Travel History: Not relevant  Outpatient Encounter Medications as of 06/19/2017  Medication Sig  . acetaminophen-codeine (TYLENOL #3) 300-30 MG tablet Take 1 tablet by mouth every 6 (six) hours as needed for moderate pain.  Marland Kitchen. albuterol (PROVENTIL HFA;VENTOLIN HFA) 108 (90 Base) MCG/ACT inhaler Inhale 2 puffs into the lungs every 6 (six) hours as needed for wheezing or shortness of breath (wheezing). For shortness of breath.  Marland Kitchen. albuterol (PROVENTIL) (2.5 MG/3ML) 0.083% nebulizer solution Take 3 mLs (2.5 mg total) by nebulization every 6 (six) hours as needed for wheezing or shortness of breath.  . budesonide-formoterol (SYMBICORT) 160-4.5 MCG/ACT inhaler Inhale 2 puffs into the lungs 2 (two) times daily.  . Calcium Citrate 200 MG TABS Take 2 tablets (400 mg total) by mouth every other day.  . cetirizine (ZYRTEC) 10 MG tablet Take 1 tablet (10 mg total) by mouth  daily.  . Cholecalciferol (VITAMIN D3) 5000 units CAPS Take 5,000 Units by mouth once a week.  . diclofenac (VOLTAREN) 75 MG EC tablet Take 1 tablet (75 mg total) by mouth 2 (two) times daily as needed.  . diclofenac sodium (VOLTAREN) 1 % GEL Apply 2 g topically 4 (four) times daily.  . DULoxetine (CYMBALTA) 30 MG capsule Take 1 capsule (30 mg total) by mouth 2 (two) times daily.  . fluticasone (FLONASE) 50 MCG/ACT nasal spray Place 2 sprays into both nostrils 2 (two) times daily.  . furosemide (LASIX) 20 MG tablet Take 1 tablet (20 mg total) by mouth daily as needed.  . gabapentin (NEURONTIN) 300 MG capsule 1 tab PO Qa.m and noon and 2 tabs Q p.m  . hydrochlorothiazide (HYDRODIURIL) 12.5 MG tablet Take 1 tablet (12.5 mg total) by mouth daily.  . Multiple Vitamins-Minerals (WOMENS MULTI) CAPS Take 1 capsule by mouth daily.  Marland Kitchen. omeprazole (PRILOSEC) 20 MG capsule Take 1 capsule (20 mg total) by mouth daily.  . phentermine 30 MG capsule Take 1 capsule (30 mg total) by mouth every morning.  . promethazine (PHENERGAN) 25 MG tablet Take 1 tablet (25 mg total) by mouth every 8 (eight) hours as needed for nausea or vomiting.  Marland Kitchen. Spacer/Aero-Holding Chambers (AEROCHAMBER MV) inhaler Use as instructed  . tiZANidine (ZANAFLEX) 4 MG tablet TAKE 1 TABLET BY MOUTH EVERY 6 HOURS AS NEEDED FOR MUSCLE SPASMS.  . [DISCONTINUED] diphenhydrAMINE (BENADRYL) 25 mg capsule Take 1 capsule (25 mg total) by mouth every 6 (six) hours as needed.  . [DISCONTINUED] famotidine (PEPCID) 20 MG tablet Take  1 tablet (20 mg total) by mouth 2 (two) times daily.  . [DISCONTINUED] gentamicin cream (GARAMYCIN) 0.1 % Apply 1 application topically 3 (three) times daily.  . [DISCONTINUED] HYDROcodone-acetaminophen (NORCO/VICODIN) 5-325 MG tablet Take 1 tablet by mouth every 6 (six) hours as needed for severe pain.  . [EXPIRED] predniSONE (DELTASONE) 10 MG tablet Take 4 tablets (40 mg total) by mouth daily for 5 days.   No  facility-administered encounter medications on file as of 06/19/2017.     Allergies as of 06/19/2017 - Review Complete 06/19/2017  Allergen Reaction Noted  . Bee venom Anaphylaxis 07/29/2013  . Cinnamon Anaphylaxis 09/16/2012  . Ibuprofen Nausea Only 11/12/2013  . Lexapro [escitalopram oxalate]  03/17/2017  . Tape Hives and Rash 10/02/2016    Past Medical History:  Diagnosis Date  . Anxiety   . Asthma   . Depression   . Obesity   . Obesity   . Seizures (HCC)   . Severe needle phobia     Past Surgical History:  Procedure Laterality Date  . TONSILLECTOMY      Family History  Problem Relation Age of Onset  . Hypothyroidism Mother   . Diabetes Father   . Diabetes Paternal Uncle   . Hypertension Maternal Grandmother   . Hypertension Maternal Grandfather   . Pancreatic cancer Maternal Grandfather   . Hypertension Paternal Grandmother   . Heart disease Paternal Grandfather   . Hypertension Paternal Grandfather     Social History   Socioeconomic History  . Marital status: Married    Spouse name: Not on file  . Number of children: Not on file  . Years of education: Not on file  . Highest education level: Not on file  Social Needs  . Financial resource strain: Not on file  . Food insecurity - worry: Not on file  . Food insecurity - inability: Not on file  . Transportation needs - medical: Not on file  . Transportation needs - non-medical: Not on file  Occupational History  . Not on file  Tobacco Use  . Smoking status: Never Smoker  . Smokeless tobacco: Never Used  Substance and Sexual Activity  . Alcohol use: No  . Drug use: No  . Sexual activity: Yes    Birth control/protection: None  Other Topics Concern  . Not on file  Social History Narrative  . Not on file    Review of systems: Review of Systems  Constitutional: Negative for fever and chills.  HENT: Negative.   Eyes: Negative for blurred vision.  Respiratory: as per HPI  Cardiovascular: Negative  for chest pain and palpitations.  Gastrointestinal: Negative for vomiting, diarrhea, blood per rectum. Genitourinary: Negative for dysuria, urgency, frequency and hematuria.  Musculoskeletal: Negative for myalgias, back pain and joint pain.  Skin: Negative for itching and rash.  Neurological: Negative for dizziness, tremors, focal weakness, seizures and loss of consciousness.  Endo/Heme/Allergies: Negative for environmental allergies.  Psychiatric/Behavioral: Negative for depression, suicidal ideas and hallucinations.  All other systems reviewed and are negative.  Physical Exam: Blood pressure 132/60, pulse (!) 109, height 5\' 4"  (1.626 m), weight (!) 394 lb 12.8 oz (179.1 kg), SpO2 95 %. Gen:      No acute distress HEENT:  EOMI, sclera anicteric Neck:     No masses; no thyromegaly Lungs:    Clear to auscultation bilaterally; normal respiratory effort CV:         Regular rate and rhythm; no murmurs Abd:      + bowel  sounds; soft, non-tender; no palpable masses, no distension Ext:    No edema; adequate peripheral perfusion Skin:      Warm and dry; no rash Neuro: alert and oriented x 3 Psych: normal mood and affect  Data Reviewed: FENO 05/01/17-7 FENO 06/19/17- 5  Chest x-ray 12/09/16-no acute abnormality.  Spirometry 05/01/17 FVC 2.53 [67%], FEV1 2.28 [72%], F/F 90 No obstruction, restriction possible.  Assessment:  Evaluation for asthma She has a diagnosis of childhood asthma and exercise-induced asthma.  However the symptoms are not really typical and FENO is low, spirometry does not show any obstruction.  Suspect that her weight and lack of exercise may be contributing to her symptoms.  However she is limited the amount of exercise she can do due to degenerative joint disease.  We will evaluate with full pulmonary function test.  She will continue her inhalers as prescribed for now Chest x-ray earlier this year.  However if her symptoms persist we may need a CT scan to evaluate  for hypersensitivity pneumonitis given her exposure to PET animals at home She will continue to work on weight loss.  Suspected sleep apnea Schedule sleep test.  Plan/Recommendations: - Continue Symbicort, Albuterol  - PFTs, sleep study - Weight loss  Chilton Greathouse MD [facility] Pager [phone] 06/19/2017, 11:50 AM  CC: Marcine Matar, MD

## 2017-06-19 NOTE — Patient Instructions (Signed)
Continue using your inhalers as prescribed We will schedule you for full pulmonary function test for better evaluation of the lung function and asthma We will schedule you for a sleep study to evaluate presence of sleep apnea Please continue to work on weight loss Follow-up in 2-3 months.

## 2017-06-22 ENCOUNTER — Ambulatory Visit: Payer: Self-pay | Attending: Internal Medicine | Admitting: Internal Medicine

## 2017-06-22 ENCOUNTER — Encounter: Payer: Self-pay | Admitting: Internal Medicine

## 2017-06-22 VITALS — BP 165/100 | HR 109 | Temp 97.4°F | Ht 64.0 in | Wt 398.6 lb

## 2017-06-22 DIAGNOSIS — Z8349 Family history of other endocrine, nutritional and metabolic diseases: Secondary | ICD-10-CM | POA: Insufficient documentation

## 2017-06-22 DIAGNOSIS — I10 Essential (primary) hypertension: Secondary | ICD-10-CM

## 2017-06-22 DIAGNOSIS — Z79899 Other long term (current) drug therapy: Secondary | ICD-10-CM | POA: Insufficient documentation

## 2017-06-22 DIAGNOSIS — Z6841 Body Mass Index (BMI) 40.0 and over, adult: Secondary | ICD-10-CM | POA: Insufficient documentation

## 2017-06-22 DIAGNOSIS — Z800 Family history of malignant neoplasm of digestive organs: Secondary | ICD-10-CM | POA: Insufficient documentation

## 2017-06-22 DIAGNOSIS — J45909 Unspecified asthma, uncomplicated: Secondary | ICD-10-CM | POA: Insufficient documentation

## 2017-06-22 DIAGNOSIS — Z833 Family history of diabetes mellitus: Secondary | ICD-10-CM | POA: Insufficient documentation

## 2017-06-22 DIAGNOSIS — Z8249 Family history of ischemic heart disease and other diseases of the circulatory system: Secondary | ICD-10-CM | POA: Insufficient documentation

## 2017-06-22 DIAGNOSIS — Z91030 Bee allergy status: Secondary | ICD-10-CM | POA: Insufficient documentation

## 2017-06-22 DIAGNOSIS — Z888 Allergy status to other drugs, medicaments and biological substances status: Secondary | ICD-10-CM | POA: Insufficient documentation

## 2017-06-22 DIAGNOSIS — J0100 Acute maxillary sinusitis, unspecified: Secondary | ICD-10-CM

## 2017-06-22 DIAGNOSIS — Z9889 Other specified postprocedural states: Secondary | ICD-10-CM | POA: Insufficient documentation

## 2017-06-22 DIAGNOSIS — Z91048 Other nonmedicinal substance allergy status: Secondary | ICD-10-CM | POA: Insufficient documentation

## 2017-06-22 DIAGNOSIS — Z7952 Long term (current) use of systemic steroids: Secondary | ICD-10-CM | POA: Insufficient documentation

## 2017-06-22 DIAGNOSIS — Z91018 Allergy to other foods: Secondary | ICD-10-CM | POA: Insufficient documentation

## 2017-06-22 DIAGNOSIS — K219 Gastro-esophageal reflux disease without esophagitis: Secondary | ICD-10-CM | POA: Insufficient documentation

## 2017-06-22 DIAGNOSIS — Z886 Allergy status to analgesic agent status: Secondary | ICD-10-CM | POA: Insufficient documentation

## 2017-06-22 MED ORDER — HYDROCHLOROTHIAZIDE 12.5 MG PO TABS: 12.5000 mg | ORAL_TABLET | Freq: Every day | ORAL | 3 refills | Status: DC

## 2017-06-22 MED ORDER — AMOXICILLIN-POT CLAVULANATE 875-125 MG PO TABS: 1.0000 | ORAL_TABLET | Freq: Two times a day (BID) | ORAL | 0 refills | Status: DC

## 2017-06-22 MED FILL — AMOX-CLAV 875-125 MG TABLET: 875-125 | 5 days supply | Qty: 10 | Fill #0

## 2017-06-22 MED FILL — HYDROCHLOROTHIAZIDE 12.5 MG: 12.5 | 30 days supply | Qty: 30 | Fill #0

## 2017-06-22 NOTE — Patient Instructions (Signed)

## 2017-06-22 NOTE — Progress Notes (Signed)
Patient ID: Courtney Zamora, female    DOB: 01/29/1986  MRN: [id number]  CC: Sinusitis   Subjective: Courtney Zamora is a 31 y.o. female who presents for UC visit Her concerns today include:   Pt c/o having problems with her sinuses. Started as a cold around Thanksgiving with cough, sinus congestion and pressure. Symptoms got better but sinus issues return and worse -has a running nose, congestion and pressure in the sinuses, eyes hurt. No fever. + bloody mucous.   Asthma: saw pulmonary recently. PFTs and sleep study ordered.  She has seen ortho and has another appt coming up and with pain specialist in 07/2016   Obesity: did not get the Phentermine as yet. Has had problems finding a pharmacy that carries it Patient Active Problem List   Diagnosis Date Noted  . Acute frontal sinusitis 12/15/2016  . Metatarsal stress fracture, right, sequela 11/17/2016  . Neck muscle spasm 10/05/2016  . Nipple discharge in female 09/13/2016  . Depression 08/17/2016  . Anxiety 08/17/2016  . ASCUS with positive high risk HPV cervical 08/14/2016  . Hypertension 08/14/2016  . Long term (current) use of systemic steroids 08/14/2016  . Esophageal reflux 12/29/2015  . Morbid (severe) obesity due to excess calories (HCC) 12/29/2015  . Chronic pain of right knee 12/29/2015  . Chronic pain of left ankle 12/29/2015  . 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 Asthma, moderate persistent 12/09/2015     Current Outpatient Medications on File Prior to Visit  Medication Sig Dispense Refill  . acetaminophen-codeine (TYLENOL #3) 300-30 MG tablet Take 1 tablet by mouth every 6 (six) hours as needed for moderate pain. 120 tablet 2  . albuterol (PROVENTIL HFA;VENTOLIN HFA) 108 (90 Base) MCG/ACT inhaler Inhale 2 puffs into the lungs every 6 (six) hours as needed for wheezing or shortness of breath (wheezing). For shortness of breath. 54 Inhaler 0  . albuterol (PROVENTIL) (2.5 MG/3ML) 0.083% nebulizer solution Take 3 mLs  (2.5 mg total) by nebulization every 6 (six) hours as needed for wheezing or shortness of breath. 150 mL 1  . budesonide-formoterol (SYMBICORT) 160-4.5 MCG/ACT inhaler Inhale 2 puffs into the lungs 2 (two) times daily. 3 Inhaler 3  . Calcium Citrate 200 MG TABS Take 2 tablets (400 mg total) by mouth every other day.    . cetirizine (ZYRTEC) 10 MG tablet Take 1 tablet (10 mg total) by mouth daily. 30 tablet 11  . Cholecalciferol (VITAMIN D3) 5000 units CAPS Take 5,000 Units by mouth once a week.    . diclofenac (VOLTAREN) 75 MG EC tablet Take 1 tablet (75 mg total) by mouth 2 (two) times daily as needed. 60 tablet 2  . diclofenac sodium (VOLTAREN) 1 % GEL Apply 2 g topically 4 (four) times daily. 3 Tube 3  . DULoxetine (CYMBALTA) 30 MG capsule Take 1 capsule (30 mg total) by mouth 2 (two) times daily. 60 capsule 3  . fluticasone (FLONASE) 50 MCG/ACT nasal spray Place 2 sprays into both nostrils 2 (two) times daily. 16 g 6  . furosemide (LASIX) 20 MG tablet Take 1 tablet (20 mg total) by mouth daily as needed. 12 tablet 2  . gabapentin (NEURONTIN) 300 MG capsule 1 tab PO Qa.m and noon and 2 tabs Q p.m 120 capsule 4  . hydrochlorothiazide (HYDRODIURIL) 12.5 MG tablet Take 1 tablet (12.5 mg total) by mouth daily. 90 tablet 3  . Multiple Vitamins-Minerals (WOMENS MULTI) CAPS Take 1 capsule by mouth daily.    Marland Kitchen omeprazole (PRILOSEC)  20 MG capsule Take 1 capsule (20 mg total) by mouth daily. 30 capsule 3  . phentermine 30 MG capsule Take 1 capsule (30 mg total) by mouth every morning. 30 capsule 1  . promethazine (PHENERGAN) 25 MG tablet Take 1 tablet (25 mg total) by mouth every 8 (eight) hours as needed for nausea or vomiting. 2060 tablet 2  . Spacer/Aero-Holding Chambers (AEROCHAMBER MV) inhaler Use as instructed 1 each 0  . tiZANidine (ZANAFLEX) 4 MG tablet TAKE 1 TABLET BY MOUTH EVERY 6 HOURS AS NEEDED FOR MUSCLE SPASMS. 60 tablet 2   No current facility-administered medications on file prior to  visit.     Allergies  Allergen Reactions  . Bee Venom Anaphylaxis  . Cinnamon Anaphylaxis  . Ibuprofen Nausea Only  . Lexapro [Escitalopram Oxalate]     Generalized body shaking. ? sz  . Tape Hives and Rash    EKG STICKERS give pt rash, hives    Social History   Socioeconomic History  . Marital status: Married    Spouse name: Not on file  . Number of children: Not on file  . Years of education: Not on file  . Highest education level: Not on file  Social Needs  . Financial resource strain: Not on file  . Food insecurity - worry: Not on file  . Food insecurity - inability: Not on file  . Transportation needs - medical: Not on file  . Transportation needs - non-medical: Not on file  Occupational History  . Not on file  Tobacco Use  . Smoking status: Never Smoker  . Smokeless tobacco: Never Used  Substance and Sexual Activity  . Alcohol use: No  . Drug use: No  . Sexual activity: Yes    Birth control/protection: None  Other Topics Concern  . Not on file  Social History Narrative  . Not on file    Family History  Problem Relation Age of Onset  . Hypothyroidism Mother   . Diabetes Father   . Diabetes Paternal Uncle   . Hypertension Maternal Grandmother   . Hypertension Maternal Grandfather   . Pancreatic cancer Maternal Grandfather   . Hypertension Paternal Grandmother   . Heart disease Paternal Grandfather   . Hypertension Paternal Grandfather     Past Surgical History:  Procedure Laterality Date  . TONSILLECTOMY      ROS: Review of Systems As above PHYSICAL EXAM: BP (!) 165/100   Pulse (!) 109   Temp (!) 97.4 F (36.3 C) (Oral)   Ht 5\' 4"  (1.626 m)   Wt (!) 398 lb 9.6 oz (180.8 kg)   SpO2 97%   BMI 68.42 kg/m   Wt Readings from Last 3 Encounters:  06/22/17 (!) 398 lb 9.6 oz (180.8 kg)  06/19/17 (!) 394 lb 12.8 oz (179.1 kg)  06/05/17 (!) 400 lb (181.4 kg)  BP 120/86  Physical Exam General appearance - alert, well appearing, morbidly obese  female and in no distress Nose -mild enlargement of nasal turbinates Mouth - mucous membranes moist, pharynx normal without lesions Chest -no audible congestion. Breath sounds clear BL Heart - normal rate, regular rhythm, normal S1, S2, no murmurs, rubs, clicks or gallops   ASSESSMENT AND PLAN: 1. Acute maxillary sinusitis, recurrence not specified - amoxicillin-clavulanate (AUGMENTIN) 875-125 MG tablet; Take 1 tablet by mouth 2 (two) times daily.  Dispense: 10 tablet; Refill: 0  2. Hypertension, unspecified type - hydrochlorothiazide (HYDRODIURIL) 12.5 MG tablet; Take 1 tablet (12.5 mg total) by mouth daily.  Dispense: 90 tablet; Refill: 3  3.  Asthma -keep appts for sleep study and PFTs once scheduled  Patient was given the opportunity to ask questions.  Patient verbalized understanding of the plan and was able to repeat key elements of the plan.   No orders of the defined types were placed in this encounter.    Requested Prescriptions    No prescriptions requested or ordered in this encounter    F/u in 2 mths Jonah Blue, MD, [facility]

## 2017-06-30 ENCOUNTER — Ambulatory Visit (INDEPENDENT_AMBULATORY_CARE_PROVIDER_SITE_OTHER): Payer: Self-pay | Admitting: Specialist

## 2017-06-30 ENCOUNTER — Encounter (INDEPENDENT_AMBULATORY_CARE_PROVIDER_SITE_OTHER): Payer: Self-pay | Admitting: Specialist

## 2017-06-30 VITALS — BP 179/109 | HR 108 | Ht 64.0 in | Wt 399.0 lb

## 2017-06-30 DIAGNOSIS — M25561 Pain in right knee: Secondary | ICD-10-CM

## 2017-06-30 DIAGNOSIS — M25562 Pain in left knee: Secondary | ICD-10-CM

## 2017-06-30 DIAGNOSIS — M174 Other bilateral secondary osteoarthritis of knee: Secondary | ICD-10-CM

## 2017-06-30 NOTE — Patient Instructions (Addendum)
The main ways of treat osteoarthritis, that are found to be success. Weight loss helps to decrease pain. Exercise is important to maintaining cartilage and thickness and strengthening. NSAIDs like motrin, tylenol, alleve are meds decreasing the inflamation. Ice is okay  In afternoon and evening and hot shower in the am Talk to your primary care doctor to consider bariatric surgery. I recommend discussing with your counsel whether your work related injury  Settlement allows for a reevaluation and possible reopening if not beyond the 2 year grace period post settlement, may need Dr. Ranell Patrick to assess if there is worsening of the knees.  Application for SSDI is appropriate to try obtain assistance in remaining gainful and in funding intervention.

## 2017-06-30 NOTE — Progress Notes (Signed)
Office Visit Note   Patient: Courtney Zamora           Date of Birth: 30-Oct-1985           MRN: [id number] Visit Date: 06/30/2017              Requested by: Marcine Matar, MD [address] [place], [place] [address] PCP: Marcine Matar, MD   Assessment & Plan: Visit Diagnoses:  1. Pain in both knees, unspecified chronicity   2. Other bilateral secondary osteoarthritis of knee     Plan: The main ways of treat osteoarthritis, that are found to be success. Weight loss helps to decrease pain. Exercise is important to maintaining cartilage and thickness and strengthening. NSAIDs like motrin, tylenol, alleve are meds decreasing the inflamation. Ice is okay  In afternoon and evening and hot shower in the am Talk to your primary care doctor to consider bariatric surgery. I recommend discussing with your counsel whether your work related injury  Settlement allows for a reevaluation and possible reopening if not beyond the 2 year grace period post settlement, may need Dr. Ranell Patrick to assess if there is worsening of the knees.  Application for SSDI is appropriate to try obtain assistance in remaining gainful and in funding intervention.   Follow-Up Instructions: No Follow-up on file.   Orders:  No orders of the defined types were placed in this encounter.  No orders of the defined types were placed in this encounter.     Procedures: No procedures performed   Clinical Data: No additional findings.   Subjective: Chief Complaint  Patient presents with  . Left Knee - Pain    31 year old female right handed and has been seen for problems with the left wrist. She underwent previous treatment by Dr. Ranell Patrick in 2016 for injuries she sustianed to left knee and ankle. 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 Dr. Ranell Patrick treated the knee and ankle with splints and braces. 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 She is applying for disability and requests out assistance. She describes difficulty with straightening and bending the left knee. The left knee is painful and the right knee, right knee and left knee grind and she sometimes has difficulty bending and straightening the right and left knee. AM with stiffness both knees, improves some with walking, but can not walk very far. SItting sometimes better than standing or walking. Notices some swelling in the knee left and right. She used a brace for a couple months then started PT. She has three different braces.     Review of Systems  Constitutional: Positive for activity change and unexpected weight change.  HENT: Positive for congestion, nosebleeds, rhinorrhea, sinus pain and sneezing. Negative for dental problem, drooling, ear discharge, ear pain, facial swelling, hearing loss, mouth sores, tinnitus, trouble swallowing and voice change.   Eyes: Positive for visual disturbance. Negative for photophobia, pain, discharge, redness and itching.  Respiratory: Negative for apnea, cough, choking, chest tightness, shortness of breath, wheezing and stridor.   Cardiovascular: Positive for leg swelling. Negative for chest pain and palpitations.  Gastrointestinal: Negative.  Negative for abdominal distention, abdominal pain, anal bleeding, blood in stool, constipation, diarrhea, nausea, rectal pain and vomiting.  Endocrine: Positive  for cold intolerance. Negative for heat intolerance, polydipsia, polyphagia and polyuria.  Genitourinary: Negative.  Negative for difficulty urinating, dysuria, enuresis, flank pain, hematuria and urgency.  Musculoskeletal: Positive for arthralgias, back pain, gait problem and joint swelling. Negative for myalgias, neck pain and neck stiffness.  Skin: Negative.  Negative for color change, pallor, rash and  wound.  Allergic/Immunologic: Negative.  Negative for environmental allergies and food allergies.  Neurological: Positive for dizziness and facial asymmetry. Negative for weakness and numbness.  Hematological: Negative.  Does not bruise/bleed easily.  Psychiatric/Behavioral: Negative.  Negative for agitation, behavioral problems, confusion, decreased concentration, dysphoric mood, hallucinations, self-injury, sleep disturbance and suicidal ideas. The patient is not nervous/anxious and is not hyperactive.      Objective: Vital Signs: BP (!) 179/109 (BP Location: Left Arm, Patient Position: Sitting)   Pulse (!) 108   Ht 5\' 4"  (1.626 m)   Wt (!) 399 lb (181 kg)   BMI 68.49 kg/m   Physical Exam  Musculoskeletal:       Right knee: She exhibits no effusion.       Left knee: She exhibits effusion.    Right Knee Exam   Muscle Strength  The patient has normal right knee strength.  Tenderness  The patient is experiencing tenderness in the patella and medial joint line.  Range of Motion  Extension: 0  Flexion: 110   Tests  McMurray:  Medial - negative Lateral - positive Varus: negative Valgus: positive Lachman:  Anterior - positive    Posterior - positive Drawer:  Anterior - positive    Posterior - positive Patellar apprehension: positive  Other  Erythema: absent Scars: absent Sensation: normal Pulse: present Effusion: no effusion present  Comments:  Valgus right knee pain with valgus stress testing, Grating of the patellofemoral joint line.   Left Knee Exam   Muscle Strength  The patient has normal left knee strength.  Range of Motion  Extension: -10  Flexion: 100   Tests  Varus: positive Valgus: positive Lachman:  Anterior - positive    Posterior - positive Drawer:  Anterior - positive     Posterior - positive Pivot shift: negative Patellar apprehension: positive  Other  Erythema: absent Scars: absent Pulse: present Effusion: effusion  present  Comments:  Greater valgus left knee.      Specialty Comments:  No specialty comments available.  Imaging: No results found.   PMFS History: Patient Active Problem List   Diagnosis Date Noted  . Acute frontal sinusitis 12/15/2016  . Metatarsal stress fracture, right, sequela 11/17/2016  . Neck muscle spasm 10/05/2016  . Nipple discharge in female 09/13/2016  . Depression 08/17/2016  . Anxiety 08/17/2016  . ASCUS with positive high risk HPV cervical 08/14/2016  . Hypertension 08/14/2016  . Long term (current) use of systemic steroids 08/14/2016  . Esophageal reflux 12/29/2015  . Morbid (severe) obesity due to excess calories (HCC) 12/29/2015  . Chronic pain of right knee 12/29/2015  . Chronic pain of left ankle 12/29/2015  . 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 Asthma, moderate persistent 12/09/2015   Past Medical History:  Diagnosis Date  . Anxiety   . Asthma   . Depression   . Obesity   . Obesity   . Seizures (HCC)   . Severe needle phobia     Family History  Problem Relation Age of Onset  . Hypothyroidism Mother   . Diabetes Father   . Diabetes Paternal Uncle   . Hypertension Maternal Grandmother   .  Hypertension Maternal Grandfather   . Pancreatic cancer Maternal Grandfather   . Hypertension Paternal Grandmother   . Heart disease Paternal Grandfather   . Hypertension Paternal Grandfather     Past Surgical History:  Procedure Laterality Date  . TONSILLECTOMY     Social History   Occupational History  . Not on file  Tobacco Use  . Smoking status: Never Smoker  . Smokeless tobacco: Never Used  Substance and Sexual Activity  . Alcohol use: No  . Drug use: No  . Sexual activity: Yes    Birth control/protection: None

## 2017-07-04 ENCOUNTER — Encounter: Payer: Self-pay | Admitting: Internal Medicine

## 2017-07-14 ENCOUNTER — Other Ambulatory Visit: Payer: Self-pay | Admitting: Internal Medicine

## 2017-07-14 ENCOUNTER — Other Ambulatory Visit: Payer: Self-pay

## 2017-07-14 ENCOUNTER — Emergency Department (INDEPENDENT_AMBULATORY_CARE_PROVIDER_SITE_OTHER)
Admission: EM | Admit: 2017-07-14 | Discharge: 2017-07-14 | Disposition: A | Discharge status: Home / Self Care | Payer: No Typology Code available for payment source | Source: Home / Self Care | Attending: Family Medicine | Admitting: Family Medicine

## 2017-07-14 ENCOUNTER — Encounter: Payer: Self-pay | Admitting: Emergency Medicine

## 2017-07-14 DIAGNOSIS — J329 Chronic sinusitis, unspecified: Secondary | ICD-10-CM

## 2017-07-14 MED ORDER — IPRATROPIUM BROMIDE 0.06 % NA SOLN: 2.0000 | Freq: Four times a day (QID) | NASAL | 1 refills | Status: DC

## 2017-07-14 MED ORDER — PREDNISONE 20 MG PO TABS: ORAL_TABLET | ORAL | 0 refills | Status: DC

## 2017-07-14 MED ORDER — DOXYCYCLINE HYCLATE 100 MG PO CAPS: 100.0000 mg | ORAL_CAPSULE | Freq: Two times a day (BID) | ORAL | 0 refills | Status: DC

## 2017-07-14 MED FILL — ?CETIRIZINE HCL 10 MG TABLE: 10 | 30 days supply | Qty: 30 | Fill #7

## 2017-07-14 MED FILL — ?DICLOFENAC SOD DR 75 MG TA: 75 | 30 days supply | Qty: 60 | Fill #0

## 2017-07-14 MED FILL — ?DULOXETINE HCL DR 30 MG CA: 30 MG | 30 days supply | Qty: 60 | Fill #3

## 2017-07-14 MED FILL — tiZANidine HCL 4 MG TABS: 4 | 15 days supply | Qty: 60 | Fill #1

## 2017-07-14 MED FILL — PROMETHAZINE 25 MG TABLET: 25 | 30 days supply | Qty: 90 | Fill #5

## 2017-07-14 MED FILL — GABAPENTIN 300 MG CAPSULE: 300 | 30 days supply | Qty: 120 | Fill #2

## 2017-07-14 MED FILL — ?PANTOPRAZOLE SOD DR 40MG: 40 MG | 30 days supply | Qty: 30 | Fill #2

## 2017-07-14 NOTE — ED Provider Notes (Signed)
[facility]    CSN: [id number] Arrival date & time: 07/14/17  1624     History   Chief Complaint Chief Complaint  Patient presents with  . Sinus Problem    HPI Courtney Zamora is a 31 y.o. female.   HPI Courtney Zamora is a 31 y.o. female presenting to UC with c/o 10 days of gradually worsening sinus congestion and pain with frontal headache. Hx of recurrent sinus infections, typically when the weather changes.  Pain is aching and sore.  She states Tylenol and Ibuprofen do not help.  She tried a sinus rinse once last week but felt it made the pain worse. She was tx for a sinus infection about 1 month ago. Symptoms resolved but then came back. Denies cough. Denies fever, chills, n/v/d.    Past Medical History:  Diagnosis Date  . Anxiety   . Asthma   . Depression   . Obesity   . Obesity   . Seizures (HCC)   . Severe needle phobia     Patient Active Problem List   Diagnosis Date Noted  . Acute frontal sinusitis 12/15/2016  . Metatarsal stress fracture, right, sequela 11/17/2016  . Neck muscle spasm 10/05/2016  . Nipple discharge in female 09/13/2016  . Depression 08/17/2016  . Anxiety 08/17/2016  . ASCUS with positive high risk HPV cervical 08/14/2016  . Hypertension 08/14/2016  . Long term (current) use of systemic steroids 08/14/2016  . Esophageal reflux 12/29/2015  . Morbid (severe) obesity due to excess calories (HCC) 12/29/2015  . Chronic pain of right knee 12/29/2015  . Chronic pain of left ankle 12/29/2015  . 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 Asthma, moderate persistent 12/09/2015    Past Surgical History:  Procedure Laterality Date  . TONSILLECTOMY      OB History    Gravida Para Term Preterm AB Living   1       1 0   SAB TAB Ectopic Multiple Live Births   1               Home Medications    Prior to Admission medications   Medication Sig Start Date End Date Taking? Authorizing Provider  acetaminophen-codeine (TYLENOL #3)  300-30 MG tablet Take 1 tablet by mouth every 6 (six) hours as needed for moderate pain. 04/11/17   Marcine Matar, MD  albuterol (PROVENTIL HFA;VENTOLIN HFA) 108 (90 Base) MCG/ACT inhaler Inhale 2 puffs into the lungs every 6 (six) hours as needed for wheezing or shortness of breath (wheezing). For shortness of breath. 04/06/17   Marcine Matar, MD  albuterol (PROVENTIL) (2.5 MG/3ML) 0.083% nebulizer solution Take 3 mLs (2.5 mg total) by nebulization every 6 (six) hours as needed for wheezing or shortness of breath. 11/17/16   Dessa Phi, MD  amoxicillin-clavulanate (AUGMENTIN) 875-125 MG tablet Take 1 tablet by mouth 2 (two) times daily. 06/22/17   Marcine Matar, MD  budesonide-formoterol (SYMBICORT) 160-4.5 MCG/ACT inhaler Inhale 2 puffs into the lungs 2 (two) times daily. 03/18/16   Funches, Gerilyn Nestle, MD  Calcium Citrate 200 MG TABS Take 2 tablets (400 mg total) by mouth every other day. 10/05/16   Newt Lukes, MD  cetirizine (ZYRTEC) 10 MG tablet Take 1 tablet (10 mg total) by mouth daily. 11/22/16   Storm Frisk, MD  Cholecalciferol (VITAMIN D3) 5000 units CAPS Take 5,000 Units by mouth once a week.    [provider]  diclofenac (VOLTAREN) 75 MG EC tablet Take  1 tablet (75 mg total) by mouth 2 (two) times daily as needed. 05/10/17   Kerrin Champagne, MD  diclofenac sodium (VOLTAREN) 1 % GEL Apply 2 g topically 4 (four) times daily. 05/04/17   Kerrin Champagne, MD  doxycycline (VIBRAMYCIN) 100 MG capsule Take 1 capsule (100 mg total) by mouth 2 (two) times daily. One po bid x 7 days 07/14/17   Lurene Shadow, PA-C  DULoxetine (CYMBALTA) 30 MG capsule Take 1 capsule (30 mg total) by mouth 2 (two) times daily. 04/16/17   Marcine Matar, MD  fluticasone (FLONASE) 50 MCG/ACT nasal spray Place 2 sprays into both nostrils 2 (two) times daily. 07/06/16   Storm Frisk, MD  furosemide (LASIX) 20 MG tablet Take 1 tablet (20 mg total) by mouth daily as needed. 04/10/17    Marcine Matar, MD  gabapentin (NEURONTIN) 300 MG capsule 1 tab PO Qa.m and noon and 2 tabs Q p.m 05/18/17   Marcine Matar, MD  hydrochlorothiazide (HYDRODIURIL) 12.5 MG tablet Take 1 tablet (12.5 mg total) by mouth daily. 06/22/17   Marcine Matar, MD  ipratropium (ATROVENT) 0.06 % nasal spray Place 2 sprays into both nostrils 4 (four) times daily. 07/14/17   Lurene Shadow, PA-C  Multiple Vitamins-Minerals (WOMENS MULTI) CAPS Take 1 capsule by mouth daily.    [provider]  omeprazole (PRILOSEC) 20 MG capsule Take 1 capsule (20 mg total) by mouth daily. 05/18/17   Marcine Matar, MD  phentermine 30 MG capsule Take 1 capsule (30 mg total) by mouth every morning. 05/18/17   Marcine Matar, MD  predniSONE (DELTASONE) 20 MG tablet 3 tabs po day one, then 2 po daily x 4 days 07/14/17   Lurene Shadow, PA-C  promethazine (PHENERGAN) 25 MG tablet Take 1 tablet (25 mg total) by mouth every 8 (eight) hours as needed for nausea or vomiting. 11/17/16   Dessa Phi, MD  Spacer/Aero-Holding Chambers (AEROCHAMBER MV) inhaler Use as instructed 01/25/17   Storm Frisk, MD  tiZANidine (ZANAFLEX) 4 MG tablet TAKE 1 TABLET BY MOUTH EVERY 6 HOURS AS NEEDED FOR MUSCLE SPASMS. 06/10/17   Marcine Matar, MD    Family History Family History  Problem Relation Age of Onset  . Hypothyroidism Mother   . Diabetes Father   . Diabetes Paternal Uncle   . Hypertension Maternal Grandmother   . Hypertension Maternal Grandfather   . Pancreatic cancer Maternal Grandfather   . Hypertension Paternal Grandmother   . Heart disease Paternal Grandfather   . Hypertension Paternal Grandfather     Social History Social History   Tobacco Use  . Smoking status: Never Smoker  . Smokeless tobacco: Never Used  Substance Use Topics  . Alcohol use: No  . Drug use: No     Allergies   Bee venom; Cinnamon; Ibuprofen; Lexapro [escitalopram oxalate]; and Tape   Review of Systems Review of  Systems  Constitutional: Negative for chills and fever.  HENT: Positive for congestion, ear pain (bilateral fullness), postnasal drip, rhinorrhea, sinus pressure and sinus pain. Negative for sore throat, trouble swallowing and voice change.   Respiratory: Negative for cough and shortness of breath.   Cardiovascular: Negative for chest pain and palpitations.  Gastrointestinal: Negative for abdominal pain, diarrhea, nausea and vomiting.  Musculoskeletal: Negative for arthralgias, back pain and myalgias.  Skin: Negative for rash.  Neurological: Positive for headaches. Negative for dizziness and light-headedness.     Physical Exam Triage Vital Signs  ED Triage Vitals  Enc Vitals Group     BP 07/14/17 1648 (!) 151/94     Pulse Rate 07/14/17 1648 99     Resp --      Temp 07/14/17 1648 (!) 97.4 F (36.3 C)     Temp Source 07/14/17 1648 Oral     SpO2 07/14/17 1648 100 %     Weight 07/14/17 1649 (!) 394 lb (178.7 kg)     Height 07/14/17 1649 5\' 4"  (1.626 m)     Head Circumference --      Peak Flow --      Pain Score 07/14/17 1649 4     Pain Loc --      Pain Edu? --      Excl. in GC? --    No data found.  Updated Vital Signs BP (!) 151/94 (BP Location: Right Arm)   Pulse 99   Temp (!) 97.4 F (36.3 C) (Oral)   Ht 5\' 4"  (1.626 m)   Wt (!) 394 lb (178.7 kg)   LMP 06/02/2017 (Exact Date)   SpO2 100%   BMI 67.63 kg/m   Visual Acuity Right Eye Distance:   Left Eye Distance:   Bilateral Distance:    Right Eye Near:   Left Eye Near:    Bilateral Near:     Physical Exam  Constitutional: She is oriented to person, place, and time. She appears well-developed and well-nourished. No distress.  HENT:  Head: Normocephalic and atraumatic.  Right Ear: Tympanic membrane normal.  Left Ear: Tympanic membrane normal.  Nose: Mucosal edema present. Right sinus exhibits maxillary sinus tenderness and frontal sinus tenderness. Left sinus exhibits maxillary sinus tenderness and frontal sinus  tenderness.  Mouth/Throat: Uvula is midline, oropharynx is clear and moist and mucous membranes are normal.  Eyes: EOM are normal.  Neck: Normal range of motion. Neck supple.  Cardiovascular: Normal rate and regular rhythm.  Pulmonary/Chest: Effort normal and breath sounds normal. No stridor. No respiratory distress. She has no wheezes. She has no rales.  Musculoskeletal: Normal range of motion.  Lymphadenopathy:    She has no cervical adenopathy.  Neurological: She is alert and oriented to person, place, and time.  Skin: Skin is warm and dry. She is not diaphoretic.  Psychiatric: She has a normal mood and affect. Her behavior is normal.  Nursing note and vitals reviewed.    UC Treatments / Results  Labs (all labs ordered are listed, but only abnormal results are displayed) Labs Reviewed - No data to display  EKG  EKG Interpretation None       Radiology No results found.  Procedures Procedures (including critical care time)  Medications Ordered in UC Medications - No data to display   Initial Impression / Assessment and Plan / UC Course  I have reviewed the triage vital signs and the nursing notes.  Pertinent labs & imaging results that were available during my care of the patient were reviewed by me and considered in my medical decision making (see chart for details).     Hx and exam c/w recurrent acute rhinoantritis Will start pt on Doxycycline as she was most recently on amoxicillin. Home care instructions provided F/u with PCP or ENT for recurrent sinusitis.   Final Clinical Impressions(s) / UC Diagnoses   Final diagnoses:  Recurrent rhinosinusitis    ED Discharge Orders        Ordered    doxycycline (VIBRAMYCIN) 100 MG capsule  2 times daily  07/14/17 1704    predniSONE (DELTASONE) 20 MG tablet     07/14/17 1704    ipratropium (ATROVENT) 0.06 % nasal spray  4 times daily     07/14/17 1704       Controlled Substance Prescriptions [place]  Controlled Substance Registry consulted? Not Applicable   Rolla Plate 07/14/17 6195

## 2017-07-14 NOTE — Discharge Instructions (Signed)
°  You may take 500mg acetaminophen every 4-6 hours or in combination with ibuprofen 400-600mg every 6-8 hours as needed for pain, inflammation, and fever. ° °Be sure to drink at least eight 8oz glasses of water to stay well hydrated and get at least 8 hours of sleep at night, preferably more while sick.  ° °Please take antibiotics as prescribed and be sure to complete entire course even if you start to feel better to ensure infection does not come back. ° °

## 2017-07-14 NOTE — ED Triage Notes (Signed)
Sinus pain and pressure x 10 days

## 2017-07-17 ENCOUNTER — Other Ambulatory Visit: Payer: Self-pay

## 2017-07-17 ENCOUNTER — Encounter: Payer: Self-pay | Admitting: *Deleted

## 2017-07-17 ENCOUNTER — Emergency Department (INDEPENDENT_AMBULATORY_CARE_PROVIDER_SITE_OTHER)
Admission: EM | Admit: 2017-07-17 | Discharge: 2017-07-17 | Disposition: A | Discharge status: Home / Self Care | Payer: No Typology Code available for payment source | Source: Home / Self Care | Attending: Family Medicine | Admitting: Family Medicine

## 2017-07-17 DIAGNOSIS — T7840XA Allergy, unspecified, initial encounter: Secondary | ICD-10-CM

## 2017-07-17 MED ORDER — PREDNISONE 20 MG PO TABS
ORAL_TABLET | ORAL | 0 refills | Status: DC
Start: 2017-07-17 — End: 2017-07-21

## 2017-07-17 MED ORDER — FAMOTIDINE 20 MG PO TABS
20.0000 mg | ORAL_TABLET | Freq: Once | ORAL | Status: AC
  Administered 2017-07-17: 20 mg via ORAL

## 2017-07-17 MED ORDER — DIPHENHYDRAMINE HCL 50 MG PO CAPS
50.0000 mg | ORAL_CAPSULE | Freq: Once | ORAL | Status: AC
  Administered 2017-07-17: 50 mg via ORAL

## 2017-07-17 MED ORDER — FAMOTIDINE 20 MG PO TABS: ORAL_TABLET | ORAL | 0 refills | Status: DC

## 2017-07-17 MED FILL — ?FUROSEMIDE 20 MG TABLET: 20 | 12 days supply | Qty: 12 | Fill #0

## 2017-07-17 MED FILL — ?HYDROCHLOROTHIAZIDE 12.5MG: 12.5 | 30 days supply | Qty: 30 | Fill #10

## 2017-07-17 NOTE — Discharge Instructions (Signed)
Take Benadryl 25mg , two caps every 4 to 6 hours until symptoms resolve.  Increase fluid intake. If symptoms become significantly worse during the night or over the weekend, proceed to the local emergency room.

## 2017-07-17 NOTE — ED Provider Notes (Signed)
[facility]    CSN: [id number] Arrival date & time: 07/17/17  0908     History   Chief Complaint Chief Complaint  Patient presents with  . Allergic Reaction    HPI Courtney Zamora is a 31 y.o. female.   Patient was started on doxycycline three days ago.  Yesterday about 1700 she developed generalized itching, sore tongue, brief diplopia, and difficulty swallowing.  No shortness of breath but notes she had some mild wheezing.  Her symptoms have improved significantly after starting Benadryl (her last dose was at 0300 today).    The history is provided by the patient and a relative.    Past Medical History:  Diagnosis Date  . Anxiety   . Asthma   . Depression   . Hypertension   . Obesity   . Obesity   . Seizures (HCC)   . Severe needle phobia     Patient Active Problem List   Diagnosis Date Noted  . Acute frontal sinusitis 12/15/2016  . Metatarsal stress fracture, right, sequela 11/17/2016  . Neck muscle spasm 10/05/2016  . Nipple discharge in female 09/13/2016  . Depression 08/17/2016  . Anxiety 08/17/2016  . ASCUS with positive high risk HPV cervical 08/14/2016  . Hypertension 08/14/2016  . Long term (current) use of systemic steroids 08/14/2016  . Esophageal reflux 12/29/2015  . Morbid (severe) obesity due to excess calories (HCC) 12/29/2015  . Chronic pain of right knee 12/29/2015  . Chronic pain of left ankle 12/29/2015  . 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 Asthma, moderate persistent 12/09/2015    Past Surgical History:  Procedure Laterality Date  . TONSILLECTOMY      OB History    Gravida Para Term Preterm AB Living   1       1 0   SAB TAB Ectopic Multiple Live Births   1               Home Medications    Prior to Admission medications   Medication Sig Start Date End Date Taking? Authorizing Provider  acetaminophen-codeine (TYLENOL #3) 300-30 MG tablet Take 1 tablet by mouth every 6 (six) hours as needed for moderate pain.  04/11/17   Marcine Matar, MD  albuterol (PROVENTIL HFA;VENTOLIN HFA) 108 (90 Base) MCG/ACT inhaler Inhale 2 puffs into the lungs every 6 (six) hours as needed for wheezing or shortness of breath (wheezing). For shortness of breath. 04/06/17   Marcine Matar, MD  albuterol (PROVENTIL) (2.5 MG/3ML) 0.083% nebulizer solution Take 3 mLs (2.5 mg total) by nebulization every 6 (six) hours as needed for wheezing or shortness of breath. 11/17/16   Funches, Gerilyn Nestle, MD  budesonide-formoterol (SYMBICORT) 160-4.5 MCG/ACT inhaler Inhale 2 puffs into the lungs 2 (two) times daily. 03/18/16   Funches, Gerilyn Nestle, MD  Calcium Citrate 200 MG TABS Take 2 tablets (400 mg total) by mouth every other day. 10/05/16   Newt Lukes, MD  cetirizine (ZYRTEC) 10 MG tablet Take 1 tablet (10 mg total) by mouth daily. 11/22/16   Storm Frisk, MD  Cholecalciferol (VITAMIN D3) 5000 units CAPS Take 5,000 Units by mouth once a week.    [provider]  diclofenac (VOLTAREN) 75 MG EC tablet Take 1 tablet (75 mg total) by mouth 2 (two) times daily as needed. 05/10/17   Kerrin Champagne, MD  diclofenac sodium (VOLTAREN) 1 % GEL Apply 2 g topically 4 (four) times daily. 05/04/17   Kerrin Champagne, MD  DULoxetine (  CYMBALTA) 30 MG capsule Take 1 capsule (30 mg total) by mouth 2 (two) times daily. 04/16/17   Marcine Matar, MD  famotidine (PEPCID) 20 MG tablet Take one tab by mouth BID 07/17/17   Lattie Haw, MD  fluticasone [facility]) 50 MCG/ACT nasal spray Place 2 sprays into both nostrils 2 (two) times daily. 07/06/16   Storm Frisk, MD  furosemide (LASIX) 20 MG tablet TAKE 1 TABLET (20 MG TOTAL) BY MOUTH DAILY AS NEEDED. 07/14/17   Marcine Matar, MD  gabapentin (NEURONTIN) 300 MG capsule 1 tab PO Qa.m and noon and 2 tabs Q p.m 05/18/17   Marcine Matar, MD  hydrochlorothiazide (HYDRODIURIL) 12.5 MG tablet Take 1 tablet (12.5 mg total) by mouth daily. 06/22/17   Marcine Matar, MD  ipratropium  (ATROVENT) 0.06 % nasal spray Place 2 sprays into both nostrils 4 (four) times daily. 07/14/17   Lurene Shadow, PA-C  Multiple Vitamins-Minerals (WOMENS MULTI) CAPS Take 1 capsule by mouth daily.    [provider]  omeprazole (PRILOSEC) 20 MG capsule Take 1 capsule (20 mg total) by mouth daily. 05/18/17   Marcine Matar, MD  phentermine 30 MG capsule Take 1 capsule (30 mg total) by mouth every morning. 05/18/17   Marcine Matar, MD  predniSONE (DELTASONE) 20 MG tablet Take one tab by mouth twice daily for 5 days, then one daily for 3 days. Take with food. 07/17/17   Lattie Haw, MD  promethazine (PHENERGAN) 25 MG tablet Take 1 tablet (25 mg total) by mouth every 8 (eight) hours as needed for nausea or vomiting. 11/17/16   Dessa Phi, MD  Spacer/Aero-Holding Chambers (AEROCHAMBER MV) inhaler Use as instructed 01/25/17   Storm Frisk, MD  tiZANidine (ZANAFLEX) 4 MG tablet TAKE 1 TABLET BY MOUTH EVERY 6 HOURS AS NEEDED FOR MUSCLE SPASMS. 06/10/17   Marcine Matar, MD    Family History Family History  Problem Relation Age of Onset  . Hypothyroidism Mother   . Diabetes Father   . Diabetes Paternal Uncle   . Hypertension Maternal Grandmother   . Hypertension Maternal Grandfather   . Pancreatic cancer Maternal Grandfather   . Hypertension Paternal Grandmother   . Heart disease Paternal Grandfather   . Hypertension Paternal Grandfather     Social History Social History   Tobacco Use  . Smoking status: Never Smoker  . Smokeless tobacco: Never Used  Substance Use Topics  . Alcohol use: No  . Drug use: No     Allergies   Bee venom; Cinnamon; Ibuprofen; Lexapro [escitalopram oxalate]; and Tape   Review of Systems Review of Systems  Constitutional: Positive for activity change. Negative for appetite change, chills, diaphoresis, fatigue and fever.  HENT: Negative for sore throat and trouble swallowing.        Tongue soreness  Eyes: Negative.     Respiratory: Positive for wheezing. Negative for cough, chest tightness, shortness of breath and stridor.   Cardiovascular: Negative.   Gastrointestinal: Negative.   Genitourinary: Negative.   Musculoskeletal: Negative.   Skin: Positive for rash.  Neurological: Negative.      Physical Exam Triage Vital Signs ED Triage Vitals [07/17/17 1018]  Enc Vitals Group     BP (!) 154/96     Pulse Rate 100     Resp 18     Temp 97.7 F (36.5 C)     Temp Source Oral     SpO2 100 %  Weight (!) 400 lb (181.4 kg)     Height 5\' 4"  (1.626 m)     Head Circumference      Peak Flow      Pain Score 0     Pain Loc      Pain Edu?      Excl. in GC?    No data found.  Updated Vital Signs BP (!) 154/96 (BP Location: Left Arm)   Pulse 100   Temp 97.7 F (36.5 C) (Oral)   Resp 18   Ht 5\' 4"  (1.626 m)   Wt (!) 400 lb (181.4 kg)   SpO2 100%   BMI 68.66 kg/m   Visual Acuity Right Eye Distance:   Left Eye Distance:   Bilateral Distance:    Right Eye Near:   Left Eye Near:    Bilateral Near:     Physical Exam Nursing notes and Vital Signs reviewed. Appearance:  Patient appears stated age, and in no acute distress Eyes:  Pupils are equal, round, and reactive to light and accomodation.  Extraocular movement is intact.  Conjunctivae are not inflamed  Ears:  Canals normal.  Tympanic membranes normal.  Nose:  Mildly congested turbinates.  No sinus tenderness.  Mouth:  No lesions.  No tongue or mucous membrane swelling. No trismus. Pharynx:  Normal Neck:  Supple.  No adenopathy Lungs:  Faint expiratory wheeze posteriorly.  Breath sounds are equal.  Moving air well. Heart:  Regular rate and rhythm without murmurs, rubs, or gallops.  Abdomen:  Nontender without masses or hepatosplenomegaly.  Bowel sounds are present.  No CVA or flank tenderness.  Extremities:  No edema.  Skin:   Minimal hive-like eruption on neck.  UC Treatments / Results  Labs (all labs ordered are listed, but only  abnormal results are displayed) Labs Reviewed - No data to display  EKG  EKG Interpretation None       Radiology No results found.  Procedures Procedures (including critical care time)  Medications Ordered in UC Medications - No data to display   Initial Impression / Assessment and Plan / UC Course  I have reviewed the triage vital signs and the nursing notes.  Pertinent labs & imaging results that were available during my care of the patient were reviewed by me and considered in my medical decision making (see chart for details).    Minimal findings on exam. Administered Benadryl 50mg  PO, and Pepcid 20mg  PO. Begin prednisone burst/taper.  Rx for Pepcid 20mg . Take Benadryl 25mg , two caps every 4 to 6 hours until symptoms resolve.  Increase fluid intake. If symptoms become significantly worse during the night or over the weekend, proceed to the local emergency room.  Followup with Family Doctor if not improved in about 4 days.    Final Clinical Impressions(s) / UC Diagnoses   Final diagnoses:  Allergic reaction, initial encounter    ED Discharge Orders        Ordered    predniSONE (DELTASONE) 20 MG tablet     07/17/17 1045    famotidine (PEPCID) 20 MG tablet     07/17/17 1045          , MD 07/18/17 1538

## 2017-07-17 NOTE — ED Triage Notes (Signed)
Pt c/o itching all over, sore tongue, double vision, RT eye pain, and trouble swallowing x 1700 last night. She was given Doxycycline on 07/14/2017. Last dose Doxy was 2100 last night and last dose Benadryl was 0300 today.

## 2017-07-19 ENCOUNTER — Ambulatory Visit (INDEPENDENT_AMBULATORY_CARE_PROVIDER_SITE_OTHER): Payer: Self-pay

## 2017-07-19 ENCOUNTER — Telehealth: Payer: Self-pay | Admitting: Pulmonary Disease

## 2017-07-19 ENCOUNTER — Encounter: Payer: Self-pay | Admitting: Pulmonary Disease

## 2017-07-19 ENCOUNTER — Encounter: Payer: Self-pay | Admitting: Internal Medicine

## 2017-07-19 ENCOUNTER — Encounter (HOSPITAL_COMMUNITY): Payer: Self-pay | Admitting: Family Medicine

## 2017-07-19 ENCOUNTER — Ambulatory Visit (HOSPITAL_COMMUNITY)
Admission: EM | Admit: 2017-07-19 | Discharge: 2017-07-19 | Disposition: A | Discharge status: Home / Self Care | Payer: No Typology Code available for payment source | Attending: Family Medicine | Admitting: Family Medicine

## 2017-07-19 DIAGNOSIS — R0602 Shortness of breath: Secondary | ICD-10-CM

## 2017-07-19 DIAGNOSIS — R05 Cough: Secondary | ICD-10-CM

## 2017-07-19 DIAGNOSIS — R059 Cough, unspecified: Secondary | ICD-10-CM

## 2017-07-19 MED ORDER — DEXAMETHASONE 4 MG PO TABS: 16.0000 mg | ORAL_TABLET | Freq: Every day | ORAL | 0 refills | Status: AC

## 2017-07-19 NOTE — Telephone Encounter (Signed)
Please see telephone encounter 07/19/17

## 2017-07-19 NOTE — Discharge Instructions (Signed)
Chest xray did not show any evidence of pneumonia.   Please continue inhaler as needed. Also may want to used nebulizers. Delsym or Robitussin OTC for cough.  Honey-based tea: Use 3 teaspoons of honey with juice squeezed from half lemon. Place shaved pieces of ginger into 1/2-1 cup of water and warm over stove top. Then mix the ingredients and repeat every 4 hours as needed.  Please take dexamethasone 16 mg for 2 days.  Please return if you develop increased shortness of breath, fever, nausea, vomiting, abdominal pain, chest pain.

## 2017-07-19 NOTE — Telephone Encounter (Signed)
She needs to be seen by urgent care or go to the ED. We do not have appointments until 1/7. With chest discomfort she needs to be seen. Thanks

## 2017-07-19 NOTE — Telephone Encounter (Signed)
Spoke with pt. She is aware of SG's recommendation. Nothing further was needed. 

## 2017-07-19 NOTE — ED Provider Notes (Signed)
[facility]    CSN: [id number] Arrival date & time: 07/19/17  1957   History   Chief Complaint Chief Complaint  Patient presents with  . Shortness of Breath  . Cough    HPI Courtney Zamora is a 32 y.o. female with history of HTN and asthma presenting with cough and shortness of breath. She has been treated for sinusitis twice in the past month with antibiotics. Her congestion has improved but cough and shortness of breath persisting and worsening. In the past 2 days she has also developed some burning in her chest and increased need for her rescue inhaler. Denies fever, sore throat, ear pain. No nausea, vomiting. No abdominal pain.   HPI  Past Medical History:  Diagnosis Date  . Anxiety   . Asthma   . Depression   . Hypertension   . Obesity   . Obesity   . Seizures (HCC)   . Severe needle phobia     Patient Active Problem List   Diagnosis Date Noted  . Acute frontal sinusitis 12/15/2016  . Metatarsal stress fracture, right, sequela 11/17/2016  . Neck muscle spasm 10/05/2016  . Nipple discharge in female 09/13/2016  . Depression 08/17/2016  . Anxiety 08/17/2016  . ASCUS with positive high risk HPV cervical 08/14/2016  . Hypertension 08/14/2016  . Long term (current) use of systemic steroids 08/14/2016  . Esophageal reflux 12/29/2015  . Morbid (severe) obesity due to excess calories (HCC) 12/29/2015  . Chronic pain of right knee 12/29/2015  . Chronic pain of left ankle 12/29/2015  . 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 Asthma, moderate persistent 12/09/2015    Past Surgical History:  Procedure Laterality Date  . TONSILLECTOMY      OB History    Gravida Para Term Preterm AB Living   1       1 0   SAB TAB Ectopic Multiple Live Births   1               Home Medications    Prior to Admission medications   Medication Sig Start Date End Date Taking? Authorizing Provider  acetaminophen-codeine (TYLENOL #3) 300-30 MG tablet Take 1 tablet by mouth  every 6 (six) hours as needed for moderate pain. 04/11/17   Marcine Matar, MD  albuterol (PROVENTIL HFA;VENTOLIN HFA) 108 (90 Base) MCG/ACT inhaler Inhale 2 puffs into the lungs every 6 (six) hours as needed for wheezing or shortness of breath (wheezing). For shortness of breath. 04/06/17   Marcine Matar, MD  albuterol (PROVENTIL) (2.5 MG/3ML) 0.083% nebulizer solution Take 3 mLs (2.5 mg total) by nebulization every 6 (six) hours as needed for wheezing or shortness of breath. 11/17/16   Funches, Gerilyn Nestle, MD  budesonide-formoterol (SYMBICORT) 160-4.5 MCG/ACT inhaler Inhale 2 puffs into the lungs 2 (two) times daily. 03/18/16   Funches, Gerilyn Nestle, MD  Calcium Citrate 200 MG TABS Take 2 tablets (400 mg total) by mouth every other day. 10/05/16   Newt Lukes, MD  cetirizine (ZYRTEC) 10 MG tablet Take 1 tablet (10 mg total) by mouth daily. 11/22/16   Storm Frisk, MD  Cholecalciferol (VITAMIN D3) 5000 units CAPS Take 5,000 Units by mouth once a week.    [provider]  dexamethasone (DECADRON) 4 MG tablet Take 4 tablets (16 mg total) by mouth daily for 2 days. 07/19/17 07/21/17  Shanautica Forker C, PA-C  diclofenac (VOLTAREN) 75 MG EC tablet Take 1 tablet (75 mg total) by mouth 2 (two) times  daily as needed. 05/10/17   Kerrin Champagne, MD  diclofenac sodium (VOLTAREN) 1 % GEL Apply 2 g topically 4 (four) times daily. 05/04/17   Kerrin Champagne, MD  DULoxetine (CYMBALTA) 30 MG capsule Take 1 capsule (30 mg total) by mouth 2 (two) times daily. 04/16/17   Marcine Matar, MD  famotidine (PEPCID) 20 MG tablet Take one tab by mouth BID 07/17/17   Lattie Haw, MD  fluticasone [facility]) 50 MCG/ACT nasal spray Place 2 sprays into both nostrils 2 (two) times daily. 07/06/16   Storm Frisk, MD  furosemide (LASIX) 20 MG tablet TAKE 1 TABLET (20 MG TOTAL) BY MOUTH DAILY AS NEEDED. 07/14/17   Marcine Matar, MD  gabapentin (NEURONTIN) 300 MG capsule 1 tab PO Qa.m and noon and 2 tabs Q p.m  05/18/17   Marcine Matar, MD  hydrochlorothiazide (HYDRODIURIL) 12.5 MG tablet Take 1 tablet (12.5 mg total) by mouth daily. 06/22/17   Marcine Matar, MD  ipratropium (ATROVENT) 0.06 % nasal spray Place 2 sprays into both nostrils 4 (four) times daily. 07/14/17   Lurene Shadow, PA-C  Multiple Vitamins-Minerals (WOMENS MULTI) CAPS Take 1 capsule by mouth daily.    [provider]  omeprazole (PRILOSEC) 20 MG capsule Take 1 capsule (20 mg total) by mouth daily. 05/18/17   Marcine Matar, MD  phentermine 30 MG capsule Take 1 capsule (30 mg total) by mouth every morning. 05/18/17   Marcine Matar, MD  predniSONE (DELTASONE) 20 MG tablet Take one tab by mouth twice daily for 5 days, then one daily for 3 days. Take with food. 07/17/17   Lattie Haw, MD  promethazine (PHENERGAN) 25 MG tablet Take 1 tablet (25 mg total) by mouth every 8 (eight) hours as needed for nausea or vomiting. 11/17/16   Dessa Phi, MD  Spacer/Aero-Holding Chambers (AEROCHAMBER MV) inhaler Use as instructed 01/25/17   Storm Frisk, MD  tiZANidine (ZANAFLEX) 4 MG tablet TAKE 1 TABLET BY MOUTH EVERY 6 HOURS AS NEEDED FOR MUSCLE SPASMS. 06/10/17   Marcine Matar, MD    Family History Family History  Problem Relation Age of Onset  . Hypothyroidism Mother   . Diabetes Father   . Diabetes Paternal Uncle   . Hypertension Maternal Grandmother   . Hypertension Maternal Grandfather   . Pancreatic cancer Maternal Grandfather   . Hypertension Paternal Grandmother   . Heart disease Paternal Grandfather   . Hypertension Paternal Grandfather     Social History Social History   Tobacco Use  . Smoking status: Never Smoker  . Smokeless tobacco: Never Used  Substance Use Topics  . Alcohol use: No  . Drug use: No     Allergies   Bee venom; Cinnamon; Ibuprofen; Doxycycline; Lexapro [escitalopram oxalate]; and Tape   Review of Systems Review of Systems  Constitutional: Negative for  chills, fatigue and fever.  HENT: Negative for congestion, ear pain, rhinorrhea, sinus pressure, sore throat and trouble swallowing.   Respiratory: Positive for cough, chest tightness and shortness of breath.   Cardiovascular: Negative for chest pain.  Gastrointestinal: Negative for abdominal pain, nausea and vomiting.  Musculoskeletal: Negative for myalgias.  Skin: Negative for rash.  Neurological: Negative for dizziness, light-headedness and headaches.     Physical Exam Triage Vital Signs ED Triage Vitals [07/19/17 2040]  Enc Vitals Group     BP (!) 174/107     Pulse Rate (!) 107     Resp 18  Temp 98.4 F (36.9 C)     Temp src      SpO2 95 %     Weight      Height      Head Circumference      Peak Flow      Pain Score      Pain Loc      Pain Edu?      Excl. in GC?    No data found.  Updated Vital Signs BP (!) 174/107   Pulse (!) 107   Temp 98.4 F (36.9 C)   Resp 18   LMP 06/02/2017 (Exact Date) Comment: Denies pregnancy  SpO2 95%    Physical Exam  Constitutional: She is oriented to person, place, and time. She appears well-developed and well-nourished. No distress.  Obese female  HENT:  Head: Normocephalic and atraumatic.  Right Ear: Tympanic membrane and ear canal normal.  Left Ear: Tympanic membrane and ear canal normal.  Nose: Rhinorrhea present.  Mouth/Throat: Uvula is midline, oropharynx is clear and moist and mucous membranes are normal. No oral lesions. No trismus in the jaw. No uvula swelling. Tonsils are 1+ on the right. Tonsils are 1+ on the left. No tonsillar exudate.  Eyes: Conjunctivae are normal.  Neck: Neck supple.  Cardiovascular: Normal rate and regular rhythm.  No murmur heard. Pulmonary/Chest: Effort normal. No respiratory distress. She has decreased breath sounds. She has no wheezes. She has no rhonchi. She has no rales.  Decreased breath sounds throughout  Abdominal: Soft. There is no tenderness.  Musculoskeletal: She exhibits no  edema.  Neurological: She is alert and oriented to person, place, and time.  Skin: Skin is warm and dry.  Psychiatric: She has a normal mood and affect.  Nursing note and vitals reviewed.    UC Treatments / Results  Labs (all labs ordered are listed, but only abnormal results are displayed) Labs Reviewed - No data to display  EKG  EKG Interpretation None       Radiology Dg Chest 2 View  Result Date: 07/19/2017 CLINICAL DATA:  Shortness of breath and cough EXAM: CHEST  2 VIEW COMPARISON:  Dec 09, 2016 FINDINGS: Lungs are clear. Heart size and pulmonary vascularity are normal. No adenopathy. No bone lesions. IMPRESSION: No edema or consolidation. Electronically Signed   By: Bretta Bang III M.D.   On: 07/19/2017 21:12    Procedures Procedures (including critical care time)  Medications Ordered in UC Medications - No data to display   Initial Impression / Assessment and Plan / UC Course  I have reviewed the triage vital signs and the nursing notes.  Pertinent labs & imaging results that were available during my care of the patient were reviewed by me and considered in my medical decision making (see chart for details).    CXR without evidence of infiltrate. Patient with asthma increased SOB and cough. No accompanying URI symptoms of congestion or sore throat. Patient presents with symptoms likely from a viral upper respiratory infection vs. asthma exacerbation vs bronchitits. Do not suspect underlying cardiopulmonary process. Symptoms seem unlikely related to ACS, CHF or COPD exacerbations, pneumonia, pneumothorax. Patient is nontoxic appearing and not in need of emergent medical intervention.  Recommended symptom control with over the counter medications: Daily oral anti-histamine, Oral decongestant or IN corticosteroid, saline irrigations, cepacol lozenges, Robitussin, Delsym, honey tea.  Continue inhalers and nebulizer. Dexamethasone 16 mg x 2 days- discussed with Dr.  Chaney Malling.   Return if symptoms fail to improve in  1-2 weeks or you develop shortness of breath, chest pain, severe headache. Patient states understanding and is agreeable.  Discharged with PCP followup.  Final Clinical Impressions(s) / UC Diagnoses   Final diagnoses:  Cough    ED Discharge Orders        Ordered    dexamethasone (DECADRON) 4 MG tablet  Daily     07/19/17 2127       Controlled Substance Prescriptions [place] Controlled Substance Registry consulted? Not Applicable   Lew Dawes, [place] 07/19/17 2138

## 2017-07-19 NOTE — Telephone Encounter (Signed)
Received the following message from the pt via MyChart.  From: [name] Sent: 07/19/2017 11:13 AM EST To: Chilton Greathouse, MD Subject: RE: RE: Non-Urgent Medical Question  Sharp pain when coughing. Chest discomfort. Shortness of breathe. Symptoms have gotten worse over night. It started with a sinis infections. Was put on antibiotics that caused a allergic reaction of hives and chest discomfort I'm losing my voice as well. My throat hurts and so does my chest.  -------------------------------------------------------------- Pt also reports of body aches.   Message was sent to RA this morning as he was DOD but message was not addressed.  SG - please advise. Thanks.

## 2017-07-19 NOTE — Telephone Encounter (Signed)
From: [name]  Sent: 07/19/2017 11:13 AM EST  To: Chilton Greathouse, MD Subject: RE: RE: Non-Urgent Medical Question  Sharp pain when coughing. Chest discomfort. Shortness of breathe. Symptoms have gotten worse over night. It started with a sinis infections. Was put on antibiotics that caused a allergic reaction of hives and chest discomfort I'm losing my voice as well. My throat hurts and so does my chest.  Pt also reports of body aches.   RA please advise, as PM is unavailable. Thanks.

## 2017-07-19 NOTE — ED Triage Notes (Signed)
Pt here for cough, SOB. Reports that she had to use her rescue inhaler more than normal.  Reports that her niece was just dx with PNA.

## 2017-07-21 ENCOUNTER — Encounter: Payer: No Typology Code available for payment source | Attending: Physical Medicine & Rehabilitation

## 2017-07-21 ENCOUNTER — Encounter: Payer: Self-pay | Admitting: Physical Medicine & Rehabilitation

## 2017-07-21 ENCOUNTER — Ambulatory Visit (HOSPITAL_BASED_OUTPATIENT_CLINIC_OR_DEPARTMENT_OTHER): Payer: No Typology Code available for payment source | Admitting: Physical Medicine & Rehabilitation

## 2017-07-21 VITALS — BP 135/84 | HR 109 | Resp 14

## 2017-07-21 DIAGNOSIS — G8929 Other chronic pain: Secondary | ICD-10-CM

## 2017-07-21 DIAGNOSIS — Z8349 Family history of other endocrine, nutritional and metabolic diseases: Secondary | ICD-10-CM | POA: Insufficient documentation

## 2017-07-21 DIAGNOSIS — I10 Essential (primary) hypertension: Secondary | ICD-10-CM | POA: Insufficient documentation

## 2017-07-21 DIAGNOSIS — F419 Anxiety disorder, unspecified: Secondary | ICD-10-CM | POA: Insufficient documentation

## 2017-07-21 DIAGNOSIS — Z833 Family history of diabetes mellitus: Secondary | ICD-10-CM | POA: Insufficient documentation

## 2017-07-21 DIAGNOSIS — Z6841 Body Mass Index (BMI) 40.0 and over, adult: Secondary | ICD-10-CM | POA: Insufficient documentation

## 2017-07-21 DIAGNOSIS — M25562 Pain in left knee: Secondary | ICD-10-CM

## 2017-07-21 DIAGNOSIS — M170 Bilateral primary osteoarthritis of knee: Secondary | ICD-10-CM | POA: Insufficient documentation

## 2017-07-21 DIAGNOSIS — J45909 Unspecified asthma, uncomplicated: Secondary | ICD-10-CM | POA: Insufficient documentation

## 2017-07-21 DIAGNOSIS — Z79899 Other long term (current) drug therapy: Secondary | ICD-10-CM | POA: Insufficient documentation

## 2017-07-21 DIAGNOSIS — M25561 Pain in right knee: Secondary | ICD-10-CM

## 2017-07-21 DIAGNOSIS — F329 Major depressive disorder, single episode, unspecified: Secondary | ICD-10-CM | POA: Insufficient documentation

## 2017-07-21 NOTE — Patient Instructions (Addendum)
Agree with current meds, please follow up with PCP for this  The name of the procedure that may help your knee pain is called genicular nerve blocks under fluoro guidance, Given your fear of needles, you may not tolerate.  We can do this with oral valium for anxiety control but not under general anesthesia Please call if you wish to schedule

## 2017-07-21 NOTE — Progress Notes (Signed)
Subjective:    Patient ID: Courtney Zamora, female    DOB: 1986-05-12, 32 y.o.   MRN: [id number]  HPI Chief complaint is left greater than right knee pain  32 year old female with history of morbid obesity who sustained a work-related injury in 2016 and was treated by Dr. Devonne Doughty.  Patient states that she broke her knee on the left side however recent x-rays 06/04/2017 did not demonstrate a fracture.  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 Patient states she has been seen by orthopedic surgery, reviewed notes from Dr. Otelia Sergeant, recommendations for bariatric surgery given her major risk factor. PT TENS H wave Pool therapy  Current meds T#3, 1 p.o. 4 times daily Voltaren, using both 1% gel as well as 75 mg oral twice daily gabapentin 300 mg twice daily and 600 mg nightly Cymbalta 30 mg twice daily  No hx of knee injections.  Pt states she does not tolerate needles Patient has occasional back pain but does not have any pain that radiates from her back to her knees.  Pain Inventory Average Pain 8 Pain Right Now 8 My pain is constant, sharp, burning, stabbing, tingling and aching  In the last 24 hours, has pain interfered with the following? General activity 6 Relation with others 4 Enjoyment of life 9 What TIME of day is your pain at its worst? morning, evening, night Sleep (in general) Fair  Pain is worse with: walking, bending, standing and some activites Pain improves with: medication and TENS Relief from Meds: 4  Mobility walk without assistance walk with assistance use a walker how many minutes can you walk? 10 ability to climb steps?  no do you drive?  yes use a wheelchair transfers alone Do you have any goals in this area?  yes  Function not employed: date last employed . I need assistance with the following:  dressing, bathing, toileting, meal prep, household duties and shopping  Neuro/Psych bladder control  problems weakness trouble walking spasms depression anxiety  Prior Studies new visit  Physicians involved in your care new visit   Family History  Problem Relation Age of Onset  . Hypothyroidism Mother   . Diabetes Father   . Diabetes Paternal Uncle   . Hypertension Maternal Grandmother   . Hypertension Maternal Grandfather   . Pancreatic cancer Maternal Grandfather   . Hypertension Paternal Grandmother   . Heart disease Paternal Grandfather   . Hypertension Paternal Grandfather    Social History   Socioeconomic History  . Marital status: Married    Spouse name: None  . Number of children: None  . Years of education: None  . Highest education level: None  Social Needs  . Financial resource strain: None  . Food insecurity - worry: None  . Food insecurity - inability: None  . Transportation needs - medical: None  . Transportation needs - non-medical: None  Occupational History  . None  Tobacco Use  . Smoking status: Never Smoker  . Smokeless tobacco: Never Used  Substance and Sexual Activity  . Alcohol use: No  . Drug use: No  . Sexual activity: Yes    Birth control/protection: None  Other Topics Concern  . None  Social History Narrative  . None   Past Surgical History:  Procedure Laterality Date  . TONSILLECTOMY     Past Medical History:  Diagnosis Date  . Anxiety   . Asthma   . Depression   . Hypertension   . Obesity   .  Obesity   . Seizures (HCC)   . Severe needle phobia    There were no vitals taken for this visit.  Opioid Risk Score:   Fall Risk Score:  `1  Depression screen PHQ 2/9  Depression screen [facility] 2/9 07/21/2017 04/10/2017 02/09/2017 01/05/2017 12/13/2016 12/07/2016 11/17/2016  Decreased Interest 1 2 2 1 2 1 2   Down, Depressed, Hopeless 1 2 2 2 1  0 1  PHQ - 2 Score 2 4 4 3 3 1 3   Altered sleeping 2 2 3 3 2 2 3   Tired, decreased energy 2 2 3 3 2 2 2   Change in appetite 2 2 2 1 2 2 2   Feeling bad or failure about yourself  1 1 1  0 0  0 0  Trouble concentrating 0 2 2 1 2  (No Data) 2  Moving slowly or fidgety/restless 0 0 2 0 1 0 2  Suicidal thoughts 0 1 0 0 0 0 0  PHQ-9 Score 9 14 17 11 12 7 14   Difficult doing work/chores Very difficult - - - - - -  Some recent data might be hidden    Review of Systems  Constitutional: Positive for appetite change and unexpected weight change.  Respiratory: Positive for cough, shortness of breath and wheezing.   Cardiovascular: Negative.   Gastrointestinal: Negative.   Endocrine:       High blood sugar  Genitourinary: Positive for difficulty urinating.  Musculoskeletal: Positive for arthralgias, back pain and gait problem.       Spasms   Skin: Negative.   Neurological: Positive for weakness.  Psychiatric/Behavioral: Positive for dysphoric mood. The patient is nervous/anxious.        Objective:   Physical Exam  Constitutional: She appears well-developed and well-nourished.  BMI 68.8  HENT:  Head: Normocephalic and atraumatic.  Eyes: Conjunctivae and EOM are normal. Pupils are equal, round, and reactive to light.  Neck: Normal range of motion.  Cardiovascular: Normal rate, regular rhythm and normal heart sounds.  No murmur heard. Pulmonary/Chest: Effort normal and breath sounds normal. No respiratory distress. She has no wheezes.  Abdominal: Soft. Bowel sounds are normal. She exhibits no distension. There is no tenderness.  Musculoskeletal:  Patient has tenderness with even light palpation over the patella suprapatellar area infrapatellar area medial joint line lateral joint line and popliteal fossa bilaterally. Knee range of motion has full extension flexion is to 110 degrees limited by body habitus There is no medial lateral instability.  No pain with knee range of motion difficult to perform Lachmann secondary to body habitus There is no evidence of erythema at the knee joints.  There is a patch of erythema on the left side over the lateral mid calf area nontender  palpation.  Neurological: No sensory deficit. Gait abnormal.  Motor strength is 5/5 bilateral deltoid, bicep, tricep, grip, hip flexor, knee extensor, ankle dorsiflexor Body habitus alters her gait she has a wider base support, bilateral valgus at the knees. Cannot obtain patellar secondary to adipose tissue Achilles reflex 2+ bilaterally Sensation to pinprick intact bilateral C5-C6-C7 C8 L2-L3-L4 L5-S1 dermatomal distribution. Negative straight leg raising  Psychiatric: She has a normal mood and affect. Her speech is normal and behavior is normal. Thought content normal.  Nursing note and vitals reviewed.         Assessment & Plan:  1.  Chronic bilateral knee pain osteoarthritis.  We discussed that weight loss would be very helpful in reducing progression In addition she has pain with  minimal palpation that is very diffuse around the knee and does not correlate with her tricompartmental osteoarthritis. We discussed interventional pain techniques that may be helpful for her such as genicular nerve blocks under fluoroscopic guidance.  This was consistent test blocks on 2 occasions and if this produces at least 50% relief proceed to radiofrequency neurotomy of the same nerves This can be done with oral Valium anxiolysis but not under anesthesia. Given her needle phobia she does not wish to proceed with this option at the current time.  I agree with her current pain medication regimen however she does not need both oral and topical Voltaren.  She will follow-up with her primary care physician and make an appointment with me if she wishes to proceed with the genicular nerve blocks

## 2017-07-23 ENCOUNTER — Ambulatory Visit (HOSPITAL_BASED_OUTPATIENT_CLINIC_OR_DEPARTMENT_OTHER): Payer: No Typology Code available for payment source | Attending: Pulmonary Disease | Admitting: Pulmonary Disease

## 2017-07-23 VITALS — Ht 64.0 in | Wt >= 6400 oz

## 2017-07-23 NOTE — Progress Notes (Signed)
Pt. Did not want to cancel due to the cost of cancellation or not showing so she came anyway even though she was very sick.  She has some sort of respiratory infection possibly .  She could not stand for me to try a mask on and she had to have a breathing treatment as soon as she arrived.  She was coughing steadily and really congested.  She really could not stay for the study.  I did thank her for trying to come in anyway but she needs to get better.                                           Yvonne Lyndon Chenoweth  RPSGT 

## 2017-07-24 ENCOUNTER — Other Ambulatory Visit: Payer: Self-pay

## 2017-07-24 ENCOUNTER — Encounter: Payer: Self-pay | Admitting: *Deleted

## 2017-07-24 ENCOUNTER — Emergency Department (INDEPENDENT_AMBULATORY_CARE_PROVIDER_SITE_OTHER)
Admission: EM | Admit: 2017-07-24 | Discharge: 2017-07-24 | Disposition: A | Discharge status: Home / Self Care | Payer: No Typology Code available for payment source | Source: Home / Self Care | Attending: Family Medicine | Admitting: Family Medicine

## 2017-07-24 DIAGNOSIS — I10 Essential (primary) hypertension: Secondary | ICD-10-CM

## 2017-07-24 DIAGNOSIS — R05 Cough: Secondary | ICD-10-CM

## 2017-07-24 DIAGNOSIS — J4541 Moderate persistent asthma with (acute) exacerbation: Secondary | ICD-10-CM

## 2017-07-24 DIAGNOSIS — R053 Chronic cough: Secondary | ICD-10-CM

## 2017-07-24 LAB — POCT URINALYSIS DIP (MANUAL ENTRY)
Blood, UA: NEGATIVE
Glucose, UA: NEGATIVE mg/dL
Ketones, POC UA: NEGATIVE mg/dL
Leukocytes, UA: NEGATIVE
Nitrite, UA: NEGATIVE
Protein Ur, POC: 30 mg/dL — AB
Spec Grav, UA: >=1.03 — AB (ref 1.010–1.025)
Urobilinogen, UA: 0.2 E.U./dL
pH, UA: 5.5 (ref 5.0–8.0)

## 2017-07-24 LAB — POCT URINE PREGNANCY: Preg Test, Ur: NEGATIVE

## 2017-07-24 MED ORDER — IPRATROPIUM-ALBUTEROL 0.5-2.5 (3) MG/3ML IN SOLN
3.0000 mL | Freq: Once | RESPIRATORY_TRACT | Status: AC
  Administered 2017-07-24: 3 mL via RESPIRATORY_TRACT

## 2017-07-24 MED ORDER — AZITHROMYCIN 250 MG PO TABS: 250.0000 mg | ORAL_TABLET | Freq: Every day | ORAL | 0 refills | Status: DC

## 2017-07-24 MED FILL — AZITHROMYCIN 250 MG TABLET: 250 | 5 days supply | Qty: 6 | Fill #0

## 2017-07-24 NOTE — ED Triage Notes (Signed)
Patient c/o cough, chest tightness, SOB x 1 week. Using inhaler and nebulizer at home. Denies fever. She was seen at [facility] urgent care on 07/19/17.

## 2017-07-24 NOTE — ED Provider Notes (Signed)
[facility]    CSN: [id number] Arrival date & time: 07/24/17  1017     History   Chief Complaint Chief Complaint  Patient presents with  . Cough  . Shortness of Breath  . Chest Pain    HPI Courtney Zamora is a 32 y.o. female.   HPI Courtney Zamora is a 32 y.o. female presenting to UC with hx of asthma, presenting to UC with c/o continued cough, congestion, chest tightness, and SOB despite being tx with steroids, albuterol inhaler, and a short course of doxycycline. Pt was taken off doxycycline after 3 days due to developing hives.  She was seen at the [facility] in [place] 5 days ago, had a CXR, which did not show any signs of pneumonia.  She has been taking her prescription medications and OTC Robitussin w/o relief.  Pt states she gets no relief from her inhaler.     BP and HR elevated in triage. Pt states both are elevated when she does not feel well.  Pt states she has not been drinking much water but has had urinary frequency.  Denies dizziness or passing out.   Past Medical History:  Diagnosis Date  . Anxiety   . Asthma   . Depression   . Hypertension   . Obesity   . Obesity   . Seizures (HCC)   . Severe needle phobia     Patient Active Problem List   Diagnosis Date Noted  . Acute frontal sinusitis 12/15/2016  . Metatarsal stress fracture, right, sequela 11/17/2016  . Neck muscle spasm 10/05/2016  . Nipple discharge in female 09/13/2016  . Depression 08/17/2016  . Anxiety 08/17/2016  . ASCUS with positive high risk HPV cervical 08/14/2016  . Hypertension 08/14/2016  . Long term (current) use of systemic steroids 08/14/2016  . Esophageal reflux 12/29/2015  . Morbid (severe) obesity due to excess calories (HCC) 12/29/2015  . Chronic pain of right knee 12/29/2015  . Chronic pain of left ankle 12/29/2015  . 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 Asthma, moderate persistent 12/09/2015    Past Surgical History:  Procedure Laterality Date  . TONSILLECTOMY        OB History    Gravida Para Term Preterm AB Living   1       1 0   SAB TAB Ectopic Multiple Live Births   1               Home Medications    Prior to Admission medications   Medication Sig Start Date End Date Taking? Authorizing Provider  acetaminophen-codeine (TYLENOL #3) 300-30 MG tablet Take 1 tablet by mouth every 6 (six) hours as needed for moderate pain. 04/11/17   Marcine Matar, MD  albuterol (PROVENTIL HFA;VENTOLIN HFA) 108 (90 Base) MCG/ACT inhaler Inhale 2 puffs into the lungs every 6 (six) hours as needed for wheezing or shortness of breath (wheezing). For shortness of breath. 04/06/17   Marcine Matar, MD  albuterol (PROVENTIL) (2.5 MG/3ML) 0.083% nebulizer solution Take 3 mLs (2.5 mg total) by nebulization every 6 (six) hours as needed for wheezing or shortness of breath. 11/17/16   Funches, Gerilyn Nestle, MD  azithromycin (ZITHROMAX) 250 MG tablet Take 1 tablet (250 mg total) by mouth daily. Take first 2 tablets together, then 1 every day until finished. 07/24/17   Lurene Shadow, PA-C  budesonide-formoterol (SYMBICORT) 160-4.5 MCG/ACT inhaler Inhale 2 puffs into the lungs 2 (two) times daily. 03/18/16   Dessa Phi, MD  Calcium Citrate 200 MG TABS Take 2 tablets (400 mg total) by mouth every other day. 10/05/16   Newt Lukes, MD  cetirizine (ZYRTEC) 10 MG tablet Take 1 tablet (10 mg total) by mouth daily. 11/22/16   Storm Frisk, MD  Cholecalciferol (VITAMIN D3) 5000 units CAPS Take 5,000 Units by mouth once a week.    [provider]  diclofenac (VOLTAREN) 75 MG EC tablet Take 1 tablet (75 mg total) by mouth 2 (two) times daily as needed. 05/10/17   Kerrin Champagne, MD  diclofenac sodium (VOLTAREN) 1 % GEL Apply 2 g topically 4 (four) times daily. 05/04/17   Kerrin Champagne, MD  DULoxetine (CYMBALTA) 30 MG capsule Take 1 capsule (30 mg total) by mouth 2 (two) times daily. 04/16/17   Marcine Matar, MD  fluticasone (FLONASE) 50 MCG/ACT nasal spray  Place 2 sprays into both nostrils 2 (two) times daily. Patient not taking: Reported on 07/21/2017 07/06/16   Storm Frisk, MD  furosemide (LASIX) 20 MG tablet TAKE 1 TABLET (20 MG TOTAL) BY MOUTH DAILY AS NEEDED. 07/14/17   Marcine Matar, MD  gabapentin (NEURONTIN) 300 MG capsule 1 tab PO Qa.m and noon and 2 tabs Q p.m 05/18/17   Marcine Matar, MD  hydrochlorothiazide (HYDRODIURIL) 12.5 MG tablet Take 1 tablet (12.5 mg total) by mouth daily. 06/22/17   Marcine Matar, MD  Multiple Vitamins-Minerals (WOMENS MULTI) CAPS Take 1 capsule by mouth daily.    [provider]  pantoprazole (PROTONIX) 40 MG tablet Take 40 mg by mouth daily.    [provider]  phentermine 30 MG capsule Take 1 capsule (30 mg total) by mouth every morning. 05/18/17   Marcine Matar, MD  promethazine (PHENERGAN) 25 MG tablet Take 1 tablet (25 mg total) by mouth every 8 (eight) hours as needed for nausea or vomiting. 11/17/16   Dessa Phi, MD  Spacer/Aero-Holding Chambers (AEROCHAMBER MV) inhaler Use as instructed 01/25/17   Storm Frisk, MD  tiZANidine (ZANAFLEX) 4 MG tablet TAKE 1 TABLET BY MOUTH EVERY 6 HOURS AS NEEDED FOR MUSCLE SPASMS. 06/10/17   Marcine Matar, MD    Family History Family History  Problem Relation Age of Onset  . Hypothyroidism Mother   . Diabetes Father   . Diabetes Paternal Uncle   . Hypertension Maternal Grandmother   . Hypertension Maternal Grandfather   . Pancreatic cancer Maternal Grandfather   . Hypertension Paternal Grandmother   . Heart disease Paternal Grandfather   . Hypertension Paternal Grandfather     Social History Social History   Tobacco Use  . Smoking status: Never Smoker  . Smokeless tobacco: Never Used  Substance Use Topics  . Alcohol use: No  . Drug use: No     Allergies   Bee venom; Cinnamon; Doxycycline; Ibuprofen; Lexapro [escitalopram oxalate]; and Tape   Review of Systems Review of Systems  Constitutional:  Negative for chills and fever.  HENT: Positive for congestion, postnasal drip and sore throat. Negative for ear pain, rhinorrhea, trouble swallowing and voice change.   Respiratory: Positive for cough, chest tightness, shortness of breath and wheezing.   Cardiovascular: Positive for chest pain (soreness in lower ribs from coughing.). Negative for palpitations.  Gastrointestinal: Negative for abdominal pain, diarrhea, nausea and vomiting.  Musculoskeletal: Negative for arthralgias, back pain and myalgias.  Skin: Negative for rash.  Neurological: Negative for dizziness, light-headedness and headaches.     Physical Exam Triage Vital Signs ED  Triage Vitals  Enc Vitals Group     BP 07/24/17 1044 (!) 176/109     Pulse Rate 07/24/17 1044 (!) 124     Resp 07/24/17 1044 20     Temp 07/24/17 1044 98 F (36.7 C)     Temp Source 07/24/17 1044 Oral     SpO2 07/24/17 1044 97 %     Weight --      Height --      Head Circumference --      Peak Flow --      Pain Score 07/24/17 1045 6     Pain Loc --      Pain Edu? --      Excl. in GC? --    No data found.  Updated Vital Signs BP (!) 176/109 (BP Location: Right Arm)   Pulse (!) 124   Temp 98 F (36.7 C) (Oral)   Resp 20   LMP 06/02/2017   SpO2 97%   Visual Acuity Right Eye Distance:   Left Eye Distance:   Bilateral Distance:    Right Eye Near:   Left Eye Near:    Bilateral Near:     Physical Exam  Constitutional: She is oriented to person, place, and time. She appears well-developed and well-nourished.  Non-toxic appearance. She does not appear ill. No distress.  Morbidly obese female sitting on exam bed. NAD.  HENT:  Head: Normocephalic and atraumatic.  Right Ear: Tympanic membrane normal.  Left Ear: Tympanic membrane normal.  Nose: Mucosal edema present.  Mouth/Throat: Uvula is midline, oropharynx is clear and moist and mucous membranes are normal.  Eyes: EOM are normal.  Neck: Normal range of motion.  Cardiovascular:  Regular rhythm. Tachycardia present.  Pulmonary/Chest: Effort normal. She has decreased breath sounds in the right lower field and the left lower field. She has wheezes in the right lower field and the left lower field. She has no rhonchi. She has no rales.  Abdominal: Soft. There is no tenderness.  Musculoskeletal: Normal range of motion.  Neurological: She is alert and oriented to person, place, and time.  Skin: Skin is warm and dry.  Psychiatric: She has a normal mood and affect. Her behavior is normal.  Nursing note and vitals reviewed.    UC Treatments / Results  Labs (all labs ordered are listed, but only abnormal results are displayed) Labs Reviewed  POCT URINALYSIS DIP (MANUAL ENTRY) - Abnormal; Notable for the following components:      Result Value   Clarity, UA cloudy (*)    Bilirubin, UA moderate (*)    Spec Grav, UA >=1.030 (*)    Protein Ur, POC =30 (*)    All other components within normal limits  POCT URINE PREGNANCY    EKG  EKG Interpretation None       Radiology No results found.  Procedures Procedures (including critical care time)  Medications Ordered in UC Medications  ipratropium-albuterol (DUONEB) 0.5-2.5 (3) MG/3ML nebulizer solution 3 mL (3 mLs Nebulization Given 07/24/17 1124)     Initial Impression / Assessment and Plan / UC Course  I have reviewed the triage vital signs and the nursing notes.  Pertinent labs & imaging results that were available during my care of the patient were reviewed by me and considered in my medical decision making (see chart for details).     Pt c/o persistent URI symptoms for about 1 month.   BP and HR elevated in triage.  Discussed concern for possible blood  clot with pt. Recommended sending D-dimer to r/o blood clot.  Pt declined due to "severe needle phobia, I pass out and have a seizure."  Duoneb given in UC. Wheezing still present.  O2 Sat 97% on RA. No accessory muscle use Pt has been tachycardic in the  past Strongly encouraged good hydration with water. BP also elevated, possibly from recent steroid use.  Will not restart steroids due to elevated BP and HR. Will start on Azithromycin to cover for atypical bacteria (has had before w/o reaction) Strongly encouraged f/u with her PCP and a pulmonologist Discussed symptoms that warrant emergent care in the ED.   Final Clinical Impressions(s) / UC Diagnoses   Final diagnoses:  Persistent cough for 3 weeks or longer  Moderate persistent asthma with acute exacerbation  Uncontrolled hypertension    ED Discharge Orders        Ordered    azithromycin (ZITHROMAX) 250 MG tablet  Daily     07/24/17 1148       Controlled Substance Prescriptions [place] Controlled Substance Registry consulted? Not Applicable   Rolla Plate 07/24/17 1206

## 2017-07-26 ENCOUNTER — Encounter: Payer: Self-pay | Admitting: Internal Medicine

## 2017-07-26 NOTE — Telephone Encounter (Signed)
Tylenol 3 my chart refill request

## 2017-07-27 MED FILL — ACETAMINOPHEN/COD #3 TABLET: 300-30 | 30 days supply | Qty: 120 | Fill #0

## 2017-07-27 NOTE — Telephone Encounter (Signed)
Contacted pt to inform her that her rx is ready for pick up

## 2017-07-27 NOTE — Telephone Encounter (Signed)
RF request on Tylenol #3. [place] reviewed and is appropriate. Last RF was on 06/13/2017.  RF given.

## 2017-07-28 ENCOUNTER — Ambulatory Visit (INDEPENDENT_AMBULATORY_CARE_PROVIDER_SITE_OTHER)
Admission: RE | Admit: 2017-07-28 | Discharge: 2017-07-28 | Disposition: A | Discharge status: Home / Self Care | Payer: No Typology Code available for payment source | Source: Ambulatory Visit | Attending: Pulmonary Disease | Admitting: Pulmonary Disease

## 2017-07-28 ENCOUNTER — Encounter: Payer: Self-pay | Admitting: Pulmonary Disease

## 2017-07-28 ENCOUNTER — Ambulatory Visit (INDEPENDENT_AMBULATORY_CARE_PROVIDER_SITE_OTHER): Payer: No Typology Code available for payment source | Admitting: Pulmonary Disease

## 2017-07-28 VITALS — BP 128/84 | HR 123 | Ht 64.0 in | Wt 396.0 lb

## 2017-07-28 DIAGNOSIS — J019 Acute sinusitis, unspecified: Secondary | ICD-10-CM

## 2017-07-28 DIAGNOSIS — R400 Somnolence: Secondary | ICD-10-CM

## 2017-07-28 DIAGNOSIS — J4540 Moderate persistent asthma, uncomplicated: Secondary | ICD-10-CM

## 2017-07-28 LAB — NITRIC OXIDE: NITRIC OXIDE: 7

## 2017-07-28 MED ORDER — IPRATROPIUM-ALBUTEROL 0.5-2.5 (3) MG/3ML IN SOLN: 3.0000 mL | Freq: Four times a day (QID) | RESPIRATORY_TRACT | 5 refills | Status: AC | PRN

## 2017-07-28 MED ORDER — FLUTICASONE PROPIONATE 50 MCG/ACT NA SUSP: 1.0000 | Freq: Every day | NASAL | 2 refills | Status: DC

## 2017-07-28 MED ORDER — FLUTTER DEVI: 0 refills | Status: AC

## 2017-07-28 MED FILL — FLUTICASONE PROP 50 MCG SPR: 50 | 60 days supply | Qty: 16 | Fill #0

## 2017-07-28 MED FILL — IPRAT-ALBUT 0.5-3(2.5) MG/3: 0.5-2.5 (3) | 30 days supply | Qty: 360 | Fill #0

## 2017-07-28 NOTE — Progress Notes (Signed)
Courtney Zamora    [id number]    1986/02/11  Primary Care Physician:Johnson, Binnie Rail, MD  Referring Physician: Marcine Matar, MD [address] [place], [place] [address]  Chief complaint: Follow-up for asthma  HPI: 32 year old with history of anxiety, asthma, depression, obesity, degenerative joint disease.  She has a diagnosis of exercise-induced asthma childhood asthma.  She has difficulties when seasons change.  Sensitive to strong perfumes, cinnamon, cleaning with liquids.  Has complains of dyspnea with activity for the past few months.  She denies dyspnea at rest or wheezing.  No cough, sputum production.  No significant problems with snoring.  Denies daytime sleepiness. She has been on Symbicort for the past 2 years but cannot tell if it is helping.  She has albuterol inhaler which she uses occasionally.  Pets: Has a menagerie at home including snakes, lizards, hedgehogs, ferrets, dogs Occupation: Currently unemployed Exposures: No relevant exposure, no mold at home Smoking history: non smoker Travel History: Not relevant  Interim History: Has been suffering from sinus infection, upper respiratory tract infection for the past 1 month.  She was treated with amoxicillin with no improvement.  At the end of December she was given doxycycline but had to be stopped after 2 days she developed a rash. Chest x-ray on 07/19/17 did not show any acute abnormality.  She was last seen in the ED on 1/7 and given azithromycin.  She has 2 more days of the antibiotic Still has sinus congestion, postnasal drip, cough with green mucus.  She has increased dyspnea with occasional wheezing.    Ordered PFT and sleep study at last visit but this cannot be completed as she was sick..  Outpatient Encounter Medications as of 07/28/2017  Medication Sig  . acetaminophen-codeine (TYLENOL #3) 300-30 MG tablet TAKE 1 TABLET BY MOUTH EVERY 6 HOURS AS NEEDED FOR MODERATE PAIN  . albuterol  (PROVENTIL HFA;VENTOLIN HFA) 108 (90 Base) MCG/ACT inhaler Inhale 2 puffs into the lungs every 6 (six) hours as needed for wheezing or shortness of breath (wheezing). For shortness of breath.  Marland Kitchen albuterol (PROVENTIL) (2.5 MG/3ML) 0.083% nebulizer solution Take 3 mLs (2.5 mg total) by nebulization every 6 (six) hours as needed for wheezing or shortness of breath.  Marland Kitchen azithromycin (ZITHROMAX) 250 MG tablet Take 1 tablet (250 mg total) by mouth daily. Take first 2 tablets together, then 1 every day until finished.  . budesonide-formoterol (SYMBICORT) 160-4.5 MCG/ACT inhaler Inhale 2 puffs into the lungs 2 (two) times daily.  . Calcium Citrate 200 MG TABS Take 2 tablets (400 mg total) by mouth every other day.  . cetirizine (ZYRTEC) 10 MG tablet Take 1 tablet (10 mg total) by mouth daily.  . Cholecalciferol (VITAMIN D3) 5000 units CAPS Take 5,000 Units by mouth once a week.  . diclofenac (VOLTAREN) 75 MG EC tablet Take 1 tablet (75 mg total) by mouth 2 (two) times daily as needed.  . diclofenac sodium (VOLTAREN) 1 % GEL Apply 2 g topically 4 (four) times daily.  . DULoxetine (CYMBALTA) 30 MG capsule Take 1 capsule (30 mg total) by mouth 2 (two) times daily.  . furosemide (LASIX) 20 MG tablet TAKE 1 TABLET (20 MG TOTAL) BY MOUTH DAILY AS NEEDED.  Marland Kitchen gabapentin (NEURONTIN) 300 MG capsule 1 tab PO Qa.m and noon and 2 tabs Q p.m  . hydrochlorothiazide (HYDRODIURIL) 12.5 MG tablet Take 1 tablet (12.5 mg total) by mouth daily.  . Multiple Vitamins-Minerals (WOMENS MULTI)  CAPS Take 1 capsule by mouth daily.  . pantoprazole (PROTONIX) 40 MG tablet Take 40 mg by mouth daily.  . phentermine 30 MG capsule Take 1 capsule (30 mg total) by mouth every morning.  . promethazine (PHENERGAN) 25 MG tablet Take 1 tablet (25 mg total) by mouth every 8 (eight) hours as needed for nausea or vomiting.  Marland Kitchen Spacer/Aero-Holding Chambers (AEROCHAMBER MV) inhaler Use as instructed  . tiZANidine (ZANAFLEX) 4 MG tablet TAKE 1 TABLET BY  MOUTH EVERY 6 HOURS AS NEEDED FOR MUSCLE SPASMS.  . fluticasone (FLONASE) 50 MCG/ACT nasal spray Place 2 sprays into both nostrils 2 (two) times daily. (Patient not taking: Reported on 07/28/2017)   No facility-administered encounter medications on file as of 07/28/2017.     Allergies as of 07/28/2017 - Review Complete 07/28/2017  Allergen Reaction Noted  . Bee venom Anaphylaxis 07/29/2013  . Cinnamon Anaphylaxis 09/16/2012  . Doxycycline Hives 07/19/2017  . Ibuprofen Nausea Only 11/12/2013  . Lexapro [escitalopram oxalate]  03/17/2017  . Tape Hives and Rash 10/02/2016    Past Medical History:  Diagnosis Date  . Anxiety   . Asthma   . Depression   . Hypertension   . Obesity   . Obesity   . Seizures (HCC)   . Severe needle phobia     Past Surgical History:  Procedure Laterality Date  . TONSILLECTOMY      Family History  Problem Relation Age of Onset  . Hypothyroidism Mother   . Diabetes Father   . Diabetes Paternal Uncle   . Hypertension Maternal Grandmother   . Hypertension Maternal Grandfather   . Pancreatic cancer Maternal Grandfather   . Hypertension Paternal Grandmother   . Heart disease Paternal Grandfather   . Hypertension Paternal Grandfather     Social History   Socioeconomic History  . Marital status: Married    Spouse name: Not on file  . Number of children: Not on file  . Years of education: Not on file  . Highest education level: Not on file  Social Needs  . Financial resource strain: Not on file  . Food insecurity - worry: Not on file  . Food insecurity - inability: Not on file  . Transportation needs - medical: Not on file  . Transportation needs - non-medical: Not on file  Occupational History  . Not on file  Tobacco Use  . Smoking status: Never Smoker  . Smokeless tobacco: Never Used  Substance and Sexual Activity  . Alcohol use: No  . Drug use: No  . Sexual activity: Yes    Birth control/protection: None  Other Topics Concern  .  Not on file  Social History Narrative  . Not on file    Review of systems: Review of Systems  Constitutional: Negative for fever and chills.  HENT: Negative.   Eyes: Negative for blurred vision.  Respiratory: as per HPI  Cardiovascular: Negative for chest pain and palpitations.  Gastrointestinal: Negative for vomiting, diarrhea, blood per rectum. Genitourinary: Negative for dysuria, urgency, frequency and hematuria.  Musculoskeletal: Negative for myalgias, back pain and joint pain.  Skin: Negative for itching and rash.  Neurological: Negative for dizziness, tremors, focal weakness, seizures and loss of consciousness.  Endo/Heme/Allergies: Negative for environmental allergies.  Psychiatric/Behavioral: Negative for depression, suicidal ideas and hallucinations.  All other systems reviewed and are negative.  Physical Exam: Blood pressure 128/84, pulse (!) 123, height 5\' 4"  (1.626 m), weight (!) 396 lb (179.6 kg), SpO2 96 %. Gen:  No acute distress HEENT:  EOMI, sclera anicteric Neck:     No masses; no thyromegaly Lungs:    Clear to auscultation bilaterally; normal respiratory effort CV:         Regular rate and rhythm; no murmurs Abd:      + bowel sounds; soft, non-tender; no palpable masses, no distension Ext:    No edema; adequate peripheral perfusion Skin:      Warm and dry; no rash Neuro: alert and oriented x 3 Psych: normal mood and affect  Data Reviewed: FENO 05/01/17-7 FENO 06/19/17- 5 FENO 07/28/17- 7  Chest x-ray 12/09/16-no acute abnormality.  Chest x-ray 07/19/17-no acute abnormality.  Spirometry 05/01/17 FVC 2.53 [67%], FEV1 2.28 [72%], F/F 90 No obstruction, restriction possible.  Assessment:  Acute sinusitis, URTI Has been treated with multiple courses of antibiotic and is currently on azithro with 2 more days left She is not wheezing and I do not believe she will need more steroids.  I have asked her to finish azithromycin We will get a chest x-ray to make  sure she is not developing any pneumonia Start chlorpheniramine and Flonase for reduction of nasal congestion. Use Mucinex and Robitussin, flutter wall Saline nasal rinses, Nettie pot  Asthma She has a diagnosis of childhood asthma and exercise-induced asthma.  However the symptoms are not really typical and FENO is low, spirometry does not show any obstruction.  Suspect that her weight and lack of exercise may be contributing to her symptoms.  However she is limited the amount of exercise she can do due to degenerative joint disease. Continue current inhalers.  Add DuoNeb's as needed for rescue therapy PFTs are pending. She will need CBC with diff and blood allergy profile. We will obtain it at return visit after she has recovered from the acute episode. Consider CT chest if symptoms continue to eval for HP as she is exposed to multiple animals at home.   Suspected sleep apnea We will need to reschedule sleep test.  Plan/Recommendations: - Continue Symbicort, Albuterol  - Chest x-ray, finish azithromycin - Chlorpheniramine antihistamine, Flonase, Mucinex, Robitussin - Flutter valve  Chilton Greathouse MD [facility] Pager [phone] 07/28/2017, 9:40 AM  CC: Marcine Matar, MD

## 2017-07-28 NOTE — Patient Instructions (Signed)
We will get a chest x-ray today Finish your antibiotic course as prescribed Continue using your inhalers.  We will order duo nebs to be used as needed with a nebulizer Start chlorpheniramine 4 mg 3 times daily and Flonase nasal spray Continue using the Nettie pot. Use Robitussin and Mucinex.  We will give you a flutter wall for clearance of secretion Follow-up in 2 weeks.

## 2017-08-01 ENCOUNTER — Encounter: Payer: Self-pay | Admitting: Pulmonary Disease

## 2017-08-01 ENCOUNTER — Encounter: Payer: Self-pay | Admitting: Internal Medicine

## 2017-08-03 ENCOUNTER — Encounter: Payer: Self-pay | Admitting: Adult Health

## 2017-08-03 ENCOUNTER — Ambulatory Visit (INDEPENDENT_AMBULATORY_CARE_PROVIDER_SITE_OTHER)
Admission: RE | Admit: 2017-08-03 | Discharge: 2017-08-03 | Disposition: A | Discharge status: Home / Self Care | Payer: No Typology Code available for payment source | Source: Ambulatory Visit | Attending: Adult Health | Admitting: Adult Health

## 2017-08-03 ENCOUNTER — Ambulatory Visit (INDEPENDENT_AMBULATORY_CARE_PROVIDER_SITE_OTHER): Payer: No Typology Code available for payment source | Admitting: Adult Health

## 2017-08-03 VITALS — BP 134/86 | HR 110 | Temp 97.7°F | Ht 64.0 in | Wt >= 6400 oz

## 2017-08-03 DIAGNOSIS — J181 Lobar pneumonia, unspecified organism: Secondary | ICD-10-CM

## 2017-08-03 DIAGNOSIS — J4541 Moderate persistent asthma with (acute) exacerbation: Secondary | ICD-10-CM

## 2017-08-03 DIAGNOSIS — J189 Pneumonia, unspecified organism: Secondary | ICD-10-CM | POA: Insufficient documentation

## 2017-08-03 MED ORDER — PREDNISONE 10 MG PO TABS: ORAL_TABLET | ORAL | 0 refills | Status: DC

## 2017-08-03 MED ORDER — LEVALBUTEROL HCL 0.63 MG/3ML IN NEBU
0.6300 mg | INHALATION_SOLUTION | Freq: Once | RESPIRATORY_TRACT | Status: AC
  Administered 2017-08-03: 0.63 mg via RESPIRATORY_TRACT

## 2017-08-03 MED ORDER — LEVOFLOXACIN 500 MG PO TABS: 500.0000 mg | ORAL_TABLET | Freq: Every day | ORAL | 0 refills | Status: AC

## 2017-08-03 MED FILL — levoFLOXacin 500 MG TABS: 500 | 7 days supply | Qty: 7 | Fill #0

## 2017-08-03 MED FILL — predniSONE 10 MG TABS: 10 | 8 days supply | Qty: 20 | Fill #0

## 2017-08-03 NOTE — Assessment & Plan Note (Signed)
RLL w/ slow to resolve sx /onging discolored mucus after zpack  Will tx w/ levaquin   Plan Patient Instructions  Levaquin 500mg  daily for 1 week , take with food .  Prednisone taper over next week .  Mucinex DM Twice daily  As needed  Cough/congestion  Saline nasal rinses As needed   Follow up with Dr. in 2 weeks with chest xray .  Please contact office for sooner follow up if symptoms do not improve or worsen or seek emergency care

## 2017-08-03 NOTE — Patient Instructions (Addendum)
Levaquin 500mg  daily for 1 week , take with food .  Prednisone taper over next week .  Mucinex DM Twice daily  As needed  Cough/congestion  Saline nasal rinses As needed   Follow up with Dr. in 2 weeks with chest xray .  Please contact office for sooner follow up if symptoms do not improve or worsen or seek emergency care

## 2017-08-03 NOTE — Assessment & Plan Note (Signed)
Flare with PNA  Pt has ongoing slow to resolve vs recurrent  Will tx PNA , if not resolving will need CT chest and labs to look at further   Plan  Patient Instructions  Levaquin 500mg  daily for 1 week , take with food .  Prednisone taper over next week .  Mucinex DM Twice daily  As needed  Cough/congestion  Saline nasal rinses As needed   Follow up with Dr. in 2 weeks with chest xray .  Please contact office for sooner follow up if symptoms do not improve or worsen or seek emergency care

## 2017-08-03 NOTE — Progress Notes (Signed)
@Patient  ID: , female    DOB: 05-06-1986, 32 y.o.   MRN: [id number]  Chief Complaint  Patient presents with  . Follow-up    Bronchitis     Referring provider: [id number], MD  HPI: 32 year old female never smoker followed for asthma and allergic rhinitis  08/03/2017 Acute OV : PNA  Patient presents for an acute office visit.  Patient was seen last week for an asthmatic bronchitic exacerbation slow to resolve.  Patient was seen at an urgent care prior to this visit and given a Z-Pak.  She had ongoing cough with thick yellow-green mucus.  Chest x-ray showed a possible right lower lobe infiltrate consistent with an acute pneumonia.  Patient says she had minimal improvement in symptoms and over the last several days cough is been getting worse with intermittent wheezing.  Patient denies any hemoptysis chest pain orthopnea PND or increased leg swelling.  Appetite is good.  No nausea vomiting diarrhea.  Menses normal , denies preg. Urine preg neg last week.   Patient has multiple pets in her home including snakes lizards hedgehogs, ferrets , dogs . Husband has multiple snakes.   Allergies  Allergen Reactions  . Bee Venom Anaphylaxis  . Cinnamon Anaphylaxis  . Doxycycline Hives  . Ibuprofen Nausea Only  . Lexapro [Escitalopram Oxalate]     Generalized body shaking. ? sz  . Tape Hives and Rash    EKG STICKERS give pt rash, hives    There is no immunization history for the selected administration types on file for this patient.  Past Medical History:  Diagnosis Date  . Anxiety   . Asthma   . Depression   . Hypertension   . Obesity   . Obesity   . Seizures (HCC)   . Severe needle phobia     Tobacco History: Social History   Tobacco Use  Smoking Status Never Smoker  Smokeless Tobacco Never Used   Counseling given: Not Answered   Outpatient Encounter Medications as of 08/03/2017  Medication Sig  . acetaminophen-codeine (TYLENOL #3) 300-30 MG tablet  TAKE 1 TABLET BY MOUTH EVERY 6 HOURS AS NEEDED FOR MODERATE PAIN  . albuterol (PROVENTIL HFA;VENTOLIN HFA) 108 (90 Base) MCG/ACT inhaler Inhale 2 puffs into the lungs every 6 (six) hours as needed for wheezing or shortness of breath (wheezing). For shortness of breath.  08/05/2017 albuterol (PROVENTIL) (2.5 MG/3ML) 0.083% nebulizer solution Take 3 mLs (2.5 mg total) by nebulization every 6 (six) hours as needed for wheezing or shortness of breath.  . budesonide-formoterol (SYMBICORT) 160-4.5 MCG/ACT inhaler Inhale 2 puffs into the lungs 2 (two) times daily.  . Calcium Citrate 200 MG TABS Take 2 tablets (400 mg total) by mouth every other day.  . cetirizine (ZYRTEC) 10 MG tablet Take 1 tablet (10 mg total) by mouth daily.  . Cholecalciferol (VITAMIN D3) 5000 units CAPS Take 5,000 Units by mouth once a week.  . diclofenac (VOLTAREN) 75 MG EC tablet Take 1 tablet (75 mg total) by mouth 2 (two) times daily as needed.  . diclofenac sodium (VOLTAREN) 1 % GEL Apply 2 g topically 4 (four) times daily.  . DULoxetine (CYMBALTA) 30 MG capsule Take 1 capsule (30 mg total) by mouth 2 (two) times daily.  . fluticasone (FLONASE) 50 MCG/ACT nasal spray Place 2 sprays into both nostrils 2 (two) times daily.  . fluticasone (FLONASE) 50 MCG/ACT nasal spray Place 1 spray into both nostrils daily.  . furosemide (LASIX) 20 MG tablet  TAKE 1 TABLET (20 MG TOTAL) BY MOUTH DAILY AS NEEDED.  Marland Kitchen gabapentin (NEURONTIN) 300 MG capsule 1 tab PO Qa.m and noon and 2 tabs Q p.m  . hydrochlorothiazide (HYDRODIURIL) 12.5 MG tablet Take 1 tablet (12.5 mg total) by mouth daily.  Marland Kitchen ipratropium-albuterol (DUONEB) 0.5-2.5 (3) MG/3ML SOLN Take 3 mLs by nebulization every 6 (six) hours as needed.  . Multiple Vitamins-Minerals (WOMENS MULTI) CAPS Take 1 capsule by mouth daily.  . pantoprazole (PROTONIX) 40 MG tablet Take 40 mg by mouth daily.  . phentermine 30 MG capsule Take 1 capsule (30 mg total) by mouth every morning.  . promethazine (PHENERGAN)  25 MG tablet Take 1 tablet (25 mg total) by mouth every 8 (eight) hours as needed for nausea or vomiting.  Marland Kitchen Respiratory Therapy Supplies (FLUTTER) DEVI As directed.  Marland Kitchen Spacer/Aero-Holding Chambers (AEROCHAMBER MV) inhaler Use as instructed  . tiZANidine (ZANAFLEX) 4 MG tablet TAKE 1 TABLET BY MOUTH EVERY 6 HOURS AS NEEDED FOR MUSCLE SPASMS.  Marland Kitchen levofloxacin (LEVAQUIN) 500 MG tablet Take 1 tablet (500 mg total) by mouth daily for 7 days.  . predniSONE (DELTASONE) 10 MG tablet 4 tabs for 2 days, then 3 tabs for 2 days, 2 tabs for 2 days, then 1 tab for 2 days, then stop  . [DISCONTINUED] azithromycin (ZITHROMAX) 250 MG tablet Take 1 tablet (250 mg total) by mouth daily. Take first 2 tablets together, then 1 every day until finished.  . [EXPIRED] levalbuterol (XOPENEX) nebulizer solution 0.63 mg    No facility-administered encounter medications on file as of 08/03/2017.      Review of Systems  Constitutional:   No  weight loss, night sweats,  Fevers, chills, +fatigue, or  lassitude.  HEENT:   No headaches,  Difficulty swallowing,  Tooth/dental problems, or  Sore throat,                No sneezing, itching, ear ache,  +nasal congestion, post nasal drip,   CV:  No chest pain,  Orthopnea, PND, swelling in lower extremities, anasarca, dizziness, palpitations, syncope.   GI  No heartburn, indigestion, abdominal pain, nausea, vomiting, diarrhea, change in bowel habits, loss of appetite, bloody stools.   Resp:   No chest wall deformity  Skin: no rash or lesions.  GU: no dysuria, change in color of urine, no urgency or frequency.  No flank pain, no hematuria   MS:  No joint pain or swelling.  No decreased range of motion.  No back pain.    Physical Exam  BP 134/86 (BP Location: Left Arm, Cuff Size: Normal)   Pulse (!) 110   Temp 97.7 F (36.5 C)   Ht 5\' 4"  (1.626 m)   Wt (!) 400 lb 12.8 oz (181.8 kg)   SpO2 97%   BMI 68.80 kg/m   GEN: A/Ox3; pleasant , NAD, obese in wc    HEENT:   [place]/AT,  EACs-clear, TMs-wnl, NOSE-clear drainage  THROAT-clear, no lesions, no postnasal drip or exudate noted.   NECK:  Supple w/ fair ROM; no JVD; normal carotid impulses w/o bruits; no thyromegaly or nodules palpated; no lymphadenopathy.    RESP Clear  P & A; w/o, wheezes/ rales/ or rhonchi. no accessory muscle use, no dullness to percussion  CARD:  RRR, no m/r/g, tr  peripheral edema, pulses intact, no cyanosis or clubbing.  GI:   Soft & nt; nml bowel sounds; no organomegaly or masses detected.   Musco: Warm bil, no deformities or joint swelling noted.  Neuro: alert, no focal deficits noted.    Skin: Warm, no lesions or rashes    Lab Results:  CBC No results found for: WBC, RBC, HGB, HCT, PLT, MCV, MCH, MCHC, RDW, LYMPHSABS, MONOABS, EOSABS, BASOSABS  BMET No results found for: NA, K, CL, CO2, GLUCOSE, BUN, CREATININE, CALCIUM, GFRNONAA, GFRAA  BNP No results found for: BNP  ProBNP No results found for: PROBNP  Imaging: Dg Chest 2 View  Result Date: 08/03/2017 CLINICAL DATA:  Follow-up of pneumonia. Cough, chest congestion, shortness of breath, and some chest discomfort persist. History of asthma, hypertension, sinusitis, and morbid obesity. EXAM: CHEST  2 VIEW COMPARISON:  Chest x-ray of July 28, 2017, July 19, 2017, and Dec 09, 2016. FINDINGS: The lungs are adequately inflated. Subtle increased density persists in the infrahilar region on the frontal view but is not clearly evident on the lateral view. The interstitial markings of both lungs are coarse though stable. The heart and pulmonary vascularity are normal. The mediastinum is normal in width. The trachea is midline. The bony thorax is unremarkable. IMPRESSION: Persistent infiltrate or atelectasis in the right infrahilar region. No new infiltrate. An additional follow-up chest x-ray in 2-3 weeks is recommended to assure complete clearing. Electronically Signed   By: David  [place] M.D.   On: 08/03/2017 10:46   Dg  Chest 2 View  Result Date: 07/28/2017 CLINICAL DATA:  Cough and congestion. EXAM: CHEST  2 VIEW COMPARISON:  07/19/2017. FINDINGS: Mediastinum hilar structures normal. Heart size normal. No pulmonary venous congestion. Mild right base infiltrate. No pleural effusion or pneumothorax. No acute bony abnormality. IMPRESSION: 1.  Mild right base infiltrate cannot be excluded. 2.  No acute abnormality otherwise noted. Electronically Signed   By: Maisie Fus  Register   On: 07/28/2017 12:07   Dg Chest 2 View  Result Date: 07/19/2017 CLINICAL DATA:  Shortness of breath and cough EXAM: CHEST  2 VIEW COMPARISON:  Dec 09, 2016 FINDINGS: Lungs are clear. Heart size and pulmonary vascularity are normal. No adenopathy. No bone lesions. IMPRESSION: No edema or consolidation. Electronically Signed   By: Bretta Bang III M.D.   On: 07/19/2017 21:12     Assessment & Plan:   Asthma, moderate persistent Flare with PNA  Pt has ongoing slow to resolve vs recurrent  Will tx PNA , if not resolving will need CT chest and labs to look at further   Plan  Patient Instructions  Levaquin 500mg  daily for 1 week , take with food .  Prednisone taper over next week .  Mucinex DM Twice daily  As needed  Cough/congestion  Saline nasal rinses As needed   Follow up with Dr. Isaiah Serge in 2 weeks with chest xray .  Please contact office for sooner follow up if symptoms do not improve or worsen or seek emergency care       CAP (community acquired pneumonia) RLL w/ slow to resolve sx /onging discolored mucus after zpack  Will tx w/ levaquin   Plan Patient Instructions  Levaquin 500mg  daily for 1 week , take with food .  Prednisone taper over next week .  Mucinex DM Twice daily  As needed  Cough/congestion  Saline nasal rinses As needed   Follow up with Dr. Isaiah Serge in 2 weeks with chest xray .  Please contact office for sooner follow up if symptoms do not improve or worsen or seek emergency care          Rubye Oaks, NP 08/03/2017

## 2017-08-04 ENCOUNTER — Encounter: Payer: Self-pay | Admitting: Pulmonary Disease

## 2017-08-04 NOTE — Telephone Encounter (Signed)
PM please advise if pt should do her PFT while she PNA. Thanks.

## 2017-08-04 NOTE — Telephone Encounter (Signed)
Reschedule PFTs

## 2017-08-08 ENCOUNTER — Encounter: Payer: Self-pay | Admitting: Pulmonary Disease

## 2017-08-08 ENCOUNTER — Ambulatory Visit (INDEPENDENT_AMBULATORY_CARE_PROVIDER_SITE_OTHER)
Admission: RE | Admit: 2017-08-08 | Discharge: 2017-08-08 | Disposition: A | Discharge status: Home / Self Care | Payer: No Typology Code available for payment source | Source: Ambulatory Visit | Attending: Pulmonary Disease | Admitting: Pulmonary Disease

## 2017-08-08 ENCOUNTER — Other Ambulatory Visit: Payer: Self-pay

## 2017-08-08 ENCOUNTER — Ambulatory Visit (INDEPENDENT_AMBULATORY_CARE_PROVIDER_SITE_OTHER): Payer: No Typology Code available for payment source | Admitting: Pulmonary Disease

## 2017-08-08 DIAGNOSIS — J189 Pneumonia, unspecified organism: Secondary | ICD-10-CM

## 2017-08-08 DIAGNOSIS — R0602 Shortness of breath: Secondary | ICD-10-CM

## 2017-08-08 DIAGNOSIS — R0982 Postnasal drip: Secondary | ICD-10-CM

## 2017-08-08 MED ORDER — PREDNISONE 10 MG PO TABS: ORAL_TABLET | ORAL | 0 refills | Status: DC

## 2017-08-08 NOTE — Patient Instructions (Addendum)
I am sorry not getting better We will get a CT of the chest  and sinuses to look at her lungs since this has been going on for a long time Finish the Levaquin.   We will give you another round of prednisone starting at 40 mg. Reduce dose by 10 mg every 3 days. Do not start the prednisone until the CT is done Based on the results of the CT you may need a bronchoscopy for further eval.

## 2017-08-08 NOTE — Progress Notes (Addendum)
Courtney Zamora    [id number]    1986-02-19  Primary Care Physician:Johnson, Binnie Rail, MD  Referring Physician: Marcine Matar, MD [address] [place], [place] [address]  Chief complaint: Follow-up for asthma  HPI: 32 year old with history of anxiety, asthma, depression, obesity, degenerative joint disease.  She has a diagnosis of exercise-induced asthma childhood asthma.  She has difficulties when seasons change.  Sensitive to strong perfumes, cinnamon, cleaning with liquids.  Has complains of dyspnea with activity for the past few months.  She denies dyspnea at rest or wheezing.  No cough, sputum production.  No significant problems with snoring.  Denies daytime sleepiness. She has been on Symbicort for the past 2 years but cannot tell if it is helping.  She has albuterol inhaler which she uses occasionally.  Pets: Has a number of animals at home including snakes, lizards, hedgehogs, ferrets, dogs Occupation: Currently unemployed Exposures: No relevant exposure, no mold at home Smoking history: non smoker Travel History: Not relevant  Interim History: Has persistent sinus infection, upper respiratory tract infection since Thanksgiving.  Treated with amoxicillin with no improvement.  Got doxycycline but had to stop after she developed a rash.  She then got Z-Pak in early January and Levaquin last week with prednisone taper.  She feels that the prednisone slightly improved her symptoms however they worsened again.  Still has sinus congestion, postnasal drip, cough with green mucus, dyspnea with wheezing.  Ordered PFT and sleep study at last visit but this cannot be completed as she was sick..  Outpatient Encounter Medications as of 08/08/2017  Medication Sig  . acetaminophen-codeine (TYLENOL #3) 300-30 MG tablet TAKE 1 TABLET BY MOUTH EVERY 6 HOURS AS NEEDED FOR MODERATE PAIN  . albuterol (PROVENTIL HFA;VENTOLIN HFA) 108 (90 Base) MCG/ACT inhaler Inhale 2 puffs into  the lungs every 6 (six) hours as needed for wheezing or shortness of breath (wheezing). For shortness of breath.  Marland Kitchen albuterol (PROVENTIL) (2.5 MG/3ML) 0.083% nebulizer solution Take 3 mLs (2.5 mg total) by nebulization every 6 (six) hours as needed for wheezing or shortness of breath.  . budesonide-formoterol (SYMBICORT) 160-4.5 MCG/ACT inhaler Inhale 2 puffs into the lungs 2 (two) times daily.  . Calcium Citrate 200 MG TABS Take 2 tablets (400 mg total) by mouth every other day.  . cetirizine (ZYRTEC) 10 MG tablet Take 1 tablet (10 mg total) by mouth daily.  . Cholecalciferol (VITAMIN D3) 5000 units CAPS Take 5,000 Units by mouth once a week.  . diclofenac (VOLTAREN) 75 MG EC tablet Take 1 tablet (75 mg total) by mouth 2 (two) times daily as needed.  . diclofenac sodium (VOLTAREN) 1 % GEL Apply 2 g topically 4 (four) times daily.  . DULoxetine (CYMBALTA) 30 MG capsule Take 1 capsule (30 mg total) by mouth 2 (two) times daily.  . fluticasone (FLONASE) 50 MCG/ACT nasal spray Place 2 sprays into both nostrils 2 (two) times daily.  . furosemide (LASIX) 20 MG tablet TAKE 1 TABLET (20 MG TOTAL) BY MOUTH DAILY AS NEEDED.  Marland Kitchen gabapentin (NEURONTIN) 300 MG capsule 1 tab PO Qa.m and noon and 2 tabs Q p.m  . hydrochlorothiazide (HYDRODIURIL) 12.5 MG tablet Take 1 tablet (12.5 mg total) by mouth daily.  Marland Kitchen ipratropium-albuterol (DUONEB) 0.5-2.5 (3) MG/3ML SOLN Take 3 mLs by nebulization every 6 (six) hours as needed.  Marland Kitchen levofloxacin (LEVAQUIN) 500 MG tablet Take 1 tablet (500 mg total) by mouth daily for 7 days.  Marland Kitchen  Multiple Vitamins-Minerals (WOMENS MULTI) CAPS Take 1 capsule by mouth daily.  . pantoprazole (PROTONIX) 40 MG tablet Take 40 mg by mouth daily.  . phentermine 30 MG capsule Take 1 capsule (30 mg total) by mouth every morning.  . promethazine (PHENERGAN) 25 MG tablet Take 1 tablet (25 mg total) by mouth every 8 (eight) hours as needed for nausea or vomiting.  Marland Kitchen Respiratory Therapy Supplies (FLUTTER)  DEVI As directed.  Marland Kitchen Spacer/Aero-Holding Chambers (AEROCHAMBER MV) inhaler Use as instructed  . tiZANidine (ZANAFLEX) 4 MG tablet TAKE 1 TABLET BY MOUTH EVERY 6 HOURS AS NEEDED FOR MUSCLE SPASMS.  . [DISCONTINUED] fluticasone (FLONASE) 50 MCG/ACT nasal spray Place 1 spray into both nostrils daily.  . [DISCONTINUED] predniSONE (DELTASONE) 10 MG tablet 4 tabs for 2 days, then 3 tabs for 2 days, 2 tabs for 2 days, then 1 tab for 2 days, then stop   No facility-administered encounter medications on file as of 08/08/2017.     Allergies as of 08/08/2017 - Review Complete 08/08/2017  Allergen Reaction Noted  . Bee venom Anaphylaxis 07/29/2013  . Cinnamon Anaphylaxis 09/16/2012  . Doxycycline Hives 07/19/2017  . Ibuprofen Nausea Only 11/12/2013  . Lexapro [escitalopram oxalate]  03/17/2017  . Tape Hives and Rash 10/02/2016    Past Medical History:  Diagnosis Date  . Anxiety   . Asthma   . Depression   . Hypertension   . Obesity   . Obesity   . Seizures (HCC)   . Severe needle phobia     Past Surgical History:  Procedure Laterality Date  . TONSILLECTOMY      Family History  Problem Relation Age of Onset  . Hypothyroidism Mother   . Diabetes Father   . Diabetes Paternal Uncle   . Hypertension Maternal Grandmother   . Hypertension Maternal Grandfather   . Pancreatic cancer Maternal Grandfather   . Hypertension Paternal Grandmother   . Heart disease Paternal Grandfather   . Hypertension Paternal Grandfather     Social History   Socioeconomic History  . Marital status: Married    Spouse name: Not on file  . Number of children: Not on file  . Years of education: Not on file  . Highest education level: Not on file  Social Needs  . Financial resource strain: Not on file  . Food insecurity - worry: Not on file  . Food insecurity - inability: Not on file  . Transportation needs - medical: Not on file  . Transportation needs - non-medical: Not on file  Occupational History    . Not on file  Tobacco Use  . Smoking status: Never Smoker  . Smokeless tobacco: Never Used  Substance and Sexual Activity  . Alcohol use: No  . Drug use: No  . Sexual activity: Yes    Birth control/protection: None  Other Topics Concern  . Not on file  Social History Narrative  . Not on file    Review of systems: Review of Systems  Constitutional: Negative for fever and chills.  HENT: Negative.   Eyes: Negative for blurred vision.  Respiratory: as per HPI  Cardiovascular: Negative for chest pain and palpitations.  Gastrointestinal: Negative for vomiting, diarrhea, blood per rectum. Genitourinary: Negative for dysuria, urgency, frequency and hematuria.  Musculoskeletal: Negative for myalgias, back pain and joint pain.  Skin: Negative for itching and rash.  Neurological: Negative for dizziness, tremors, focal weakness, seizures and loss of consciousness.  Endo/Heme/Allergies: Negative for environmental allergies.  Psychiatric/Behavioral: Negative for depression, suicidal  ideas and hallucinations.  All other systems reviewed and are negative.  Physical Exam: Blood pressure 136/78, pulse (!) 101, height 5\' 4"  (1.626 m), weight (!) 400 lb (181.4 kg), SpO2 97 %. Gen:      No acute distress HEENT:  EOMI, sclera anicteric Neck:     No masses; no thyromegaly Lungs:    Clear to auscultation bilaterally; normal respiratory effort CV:         Regular rate and rhythm; no murmurs Abd:      + bowel sounds; soft, non-tender; no palpable masses, no distension Ext:    No edema; adequate peripheral perfusion Skin:      Warm and dry; no rash Neuro: alert and oriented x 3 Psych: normal mood and affect  Data Reviewed: FENO 05/01/17-7 FENO 06/19/17- 5 FENO 07/28/17- 7  Chest x-ray 12/09/16-no acute abnormality.  Chest x-ray 07/19/17-no acute abnormality. Chest x-ray 07/28/17-possible right basilar infiltrate Chest x-ray 08/03/17-atelectasis/infiltrate in the right infrahilar region Chest  x-ray 08/08/17- infiltrate in the right infrahilar region has cleared.  No other lung abnormalities.  I have reviewed the images personally  Spirometry 05/01/17 FVC 2.53 [67%], FEV1 2.28 [72%], F/F 90 No obstruction, restriction possible.  Assessment:  Acute sinusitis, URTI Has been treated with multiple courses of antibiotic and is currently on levofloxacin with 2 more days left Although chest x-ray is clear I will get a CT scan as she has unresolving symptoms.  She will finish her antibiotic and I will give her another prednisone taper.  If her symptoms worsen then she may need to go to the emergency room as we are reaching the limits of what can be achieved on an outpatient basis.  Asthma She has a diagnosis of childhood asthma and exercise-induced asthma.  However the symptoms are not really typical and FENO is low, spirometry does not show any obstruction.  Suspect that her weight and lack of exercise may be contributing to her symptoms.  She is limited the amount of exercise she can do due to degenerative joint disease. Continue current inhalers and DuoNeb's as needed for rescue therapy. Add singulair  We are unable to get labs including CBC, allergy profile, IgE levels as she refused saying that she has seizures every time she has a blood draw.  Suggested that exposure to animals at home may be causing her symptoms but she does not want to get rid of them. States that she has the animals for several years without any problem.  Suspected sleep apnea We will need to reschedule sleep test.  Plan/Recommendations: - Continue Symbicort, Albuterol  - Finish Levaquin, Pred taper. Start singulair - CT chest - Continue mucinex, flutter valve  05/03/17 MD  Pulmonary and Critical Care Pager [phone] 08/08/2017, 4:52 PM  CC: 08/10/2017, MD

## 2017-08-09 ENCOUNTER — Telehealth: Payer: Self-pay | Admitting: Pulmonary Disease

## 2017-08-09 MED ORDER — ALPRAZOLAM 0.25 MG PO TABS: 0.2500 mg | ORAL_TABLET | ORAL | 0 refills | Status: DC

## 2017-08-09 MED ORDER — MONTELUKAST SODIUM 10 MG PO TABS: 10.0000 mg | ORAL_TABLET | Freq: Every day | ORAL | 5 refills | Status: DC

## 2017-08-09 MED FILL — ?MONTELUKAST SOD 10 MG TAB: 10 | 30 days supply | Qty: 30 | Fill #0

## 2017-08-09 MED FILL — predniSONE 10 MG TABS: 10 | 12 days supply | Qty: 30 | Fill #0

## 2017-08-09 NOTE — Telephone Encounter (Signed)
Patient returned call and was advised Xanax was sent to pharmacy.  She states she does not need a call back.

## 2017-08-09 NOTE — Telephone Encounter (Signed)
Called and spoke with pt.  Pt is requesting a medication to help with anxiety, as she is scheduled for CT tomorrow.  PM please advise. Thanks.

## 2017-08-09 NOTE — Addendum Note (Signed)
Addended by: Maxwell Marion A on: 08/09/2017 04:10 PM   Modules accepted: Orders

## 2017-08-09 NOTE — Telephone Encounter (Signed)
I left message for the patient to call our office to speak to nurse to verify this has been sent and to which pharmacy after [facility] called to advise they do not have this ready for the patient.

## 2017-08-09 NOTE — Telephone Encounter (Signed)
Pt is aware that xanax has been phoned in to [facility] on [address].  [facility] will [facility] is aware that Rx has been phoned in to [facility] as well. Nothing further is needed.

## 2017-08-09 NOTE — Telephone Encounter (Signed)
[facility] - [phone].  [facility] calling to state they do not dispense Xanax.  This should be sent to Karin Golden at [facility].

## 2017-08-09 NOTE — Telephone Encounter (Signed)
Per PM verbally- xanax 0.25 q4h piror to CT scan  #2. Rx has been phoned in to [facility] on [address].  lmtcb x1 for pt

## 2017-08-09 NOTE — Telephone Encounter (Signed)
Per PM- add Singulair 10mg  daily. Rx has been sent to [facility] per pt request. Pt is aware and voiced her understanding.  Nothing further is needed.

## 2017-08-10 ENCOUNTER — Ambulatory Visit (INDEPENDENT_AMBULATORY_CARE_PROVIDER_SITE_OTHER)
Admission: RE | Admit: 2017-08-10 | Discharge: 2017-08-10 | Disposition: A | Discharge status: Home / Self Care | Payer: No Typology Code available for payment source | Source: Ambulatory Visit | Attending: Pulmonary Disease | Admitting: Pulmonary Disease

## 2017-08-10 DIAGNOSIS — R0602 Shortness of breath: Secondary | ICD-10-CM

## 2017-08-10 DIAGNOSIS — R0982 Postnasal drip: Secondary | ICD-10-CM

## 2017-08-11 ENCOUNTER — Ambulatory Visit (INDEPENDENT_AMBULATORY_CARE_PROVIDER_SITE_OTHER): Payer: Self-pay | Admitting: Specialist

## 2017-08-11 ENCOUNTER — Encounter: Payer: Self-pay | Admitting: Internal Medicine

## 2017-08-11 ENCOUNTER — Other Ambulatory Visit: Payer: Self-pay | Admitting: Pulmonary Disease

## 2017-08-11 ENCOUNTER — Encounter (INDEPENDENT_AMBULATORY_CARE_PROVIDER_SITE_OTHER): Payer: Self-pay | Admitting: Specialist

## 2017-08-11 VITALS — BP 155/99 | HR 103 | Ht 64.0 in | Wt 399.0 lb

## 2017-08-11 DIAGNOSIS — M25541 Pain in joints of right hand: Secondary | ICD-10-CM

## 2017-08-11 DIAGNOSIS — K219 Gastro-esophageal reflux disease without esophagitis: Secondary | ICD-10-CM

## 2017-08-11 DIAGNOSIS — I10 Essential (primary) hypertension: Secondary | ICD-10-CM

## 2017-08-11 DIAGNOSIS — M174 Other bilateral secondary osteoarthritis of knee: Secondary | ICD-10-CM

## 2017-08-11 NOTE — Patient Instructions (Signed)
  Knee is suffering from osteoarthritis, only real proven treatments are Weight loss, NSIADs like diclofenac gel and exercise. Well padded shoes help. Ice the knee 2-3 times a day 15-20 mins at a time. Please stop the voltaren oral medication while taking prednisone as the combined medications increase risk of gastric irritation or ulcer. When you has stopped the prednisone, you can resume voltaren(diclofenac)  It is okay to take the tylenol #3 for pain control when not taking the diclofenac but also realize the steriod is a very potent  Anti inflamatory medication and the knee arthritis pain may be better with steriod use also.  Try forearm platforms for the walker to reduce stress on the hands when using a walker.

## 2017-08-11 NOTE — Progress Notes (Signed)
Office Visit Note   Patient: Courtney Zamora           Date of Birth: 1985/10/06           MRN: [id number] Visit Date: 08/11/2017              Requested by: Marcine Matar, MD [address] [place], [place] [address] PCP: Marcine Matar, MD   Assessment & Plan: Visit Diagnoses:  1. Other bilateral secondary osteoarthritis of knee     Plan: Knee is suffering from osteoarthritis, only real proven treatments are Weight loss, NSIADs like diclofenac gel and exercise. Well padded shoes help. Ice the knee 2-3 times a day 15-20 mins at a time. Please stop the voltaren oral medication while taking prednisone as the combined medications increase risk of gastric irritation or ulcer. When you has stopped the prednisone, you can resume voltaren(diclofenac)  It is okay to take the tylenol #3 for pain control when not taking the diclofenac but also realize the steriod is a very potent  Anti inflamatory medication and the knee arthritis pain may be better with steriod use also.  Try forearm platforms for the walker to reduce stress on the hands when using a walker.   Follow-Up Instructions: No Follow-up on file.   Orders:  No orders of the defined types were placed in this encounter.  No orders of the defined types were placed in this encounter.     Procedures: No procedures performed   Clinical Data: No additional findings.   Subjective: Chief Complaint  Patient presents with  . Right Knee - Follow-up  . Left Knee - Follow-up    32 year old female with history of URTI and has been on 3 different po antibiotics over the past 3 weeks. She is not working since about 05/07/2013 due to knee arthritis and ankle and  Bilateral wrist pain and stiffness in the hands involving the IP joint right thumb right long and ring and left index and little finger with stiffness. The fingers have swelling, AM stiffness. No  Numbness or tingling. Trouble reaching her shoes and socks, she  wears flipflops to improve her ability to don her shoes. No bowel or bladder difficulties.     Review of Systems  Constitutional: Negative.   HENT: Negative.   Eyes: Negative.   Respiratory: Negative.   Cardiovascular: Negative.   Gastrointestinal: Negative.   Endocrine: Negative.   Genitourinary: Negative.   Musculoskeletal: Negative.   Skin: Negative.   Allergic/Immunologic: Negative.   Neurological: Negative.   Hematological: Negative.   Psychiatric/Behavioral: Negative.      Objective: Vital Signs: BP (!) 155/99 (BP Location: Left Arm, Patient Position: Sitting)   Pulse (!) 103   Ht 5\' 4"  (1.626 m)   Wt (!) 399 lb (181 kg)   BMI 68.49 kg/m   Physical Exam  Constitutional: She is oriented to person, place, and time. She appears well-developed and well-nourished.  HENT:  Head: Normocephalic and atraumatic.  Eyes: EOM are normal. Pupils are equal, round, and reactive to light.  Neck: Normal range of motion. Neck supple.  Pulmonary/Chest: Effort normal and breath sounds normal.  Abdominal: Soft. Bowel sounds are normal.  Musculoskeletal: Normal range of motion.  Neurological: She is alert and oriented to person, place, and time.  Skin: Skin is warm and dry.  Psychiatric: She has a normal mood and affect. Her behavior is normal. Judgment and thought content normal.    Right Knee Exam  Right knee exam is normal.  Range of Motion  Extension: normal  Flexion: 100   Tests  McMurray:  Medial - negative Lateral - negative Varus: negative Valgus: positive Lachman:  Anterior - negative    Posterior - negative Drawer:  Anterior - negative    Posterior - negative  Other  Erythema: absent Scars: absent Sensation: normal Pulse: present Swelling: none   Left Knee Exam  Left knee exam is normal.  Range of Motion  Extension: normal  Flexion: 100   Tests  McMurray:  Medial - negative Lateral - negative Valgus: positive Lachman:  Anterior - negative     Posterior - negative Drawer:  Anterior - negative     Posterior - negative  Other  Erythema: absent Scars: absent Sensation: normal Pulse: present Swelling: none      Specialty Comments:  No specialty comments available.  Imaging: Ct Chest Wo Contrast  Result Date: 08/10/2017 CLINICAL DATA:  32 year old female with history of pneumonia. Productive cough. Recently finished antibiotics. EXAM: CT CHEST WITHOUT CONTRAST TECHNIQUE: Multidetector CT imaging of the chest was performed following the standard protocol without IV contrast. COMPARISON:  No priors. FINDINGS: Cardiovascular: Heart size is normal. There is no significant pericardial fluid, thickening or pericardial calcification. No atherosclerotic calcifications in the thoracic aorta or the coronary arteries. Dilatation of the pulmonic trunk (3.3 cm in diameter), concerning for pulmonary arterial hypertension. Mediastinum/Nodes: No pathologically enlarged mediastinal or hilar lymph nodes. Please note that accurate exclusion of hilar adenopathy is limited on noncontrast CT scans. Numerous nodular appearing areas of soft tissue density in the distal third of the esophagus, concerning for multiple small polyps. No axillary lymphadenopathy. Lungs/Pleura: There are few patchy areas of very mild ground-glass attenuation scattered throughout the lungs bilaterally. No confluent consolidative airspace disease. No pleural effusions. No suspicious appearing pulmonary nodules or masses. Upper Abdomen: Severe diffuse low attenuation throughout the hepatic parenchyma, indicative of severe hepatic steatosis. Musculoskeletal: There are no aggressive appearing lytic or blastic lesions noted in the visualized portions of the skeleton. IMPRESSION: 1. There is a pattern of very mild diffuse but patchy areas of ground-glass attenuation scattered throughout the lungs bilaterally. This is highly nonspecific, but may reflect a resolving multilobar bronchopneumonia.  2. Dilatation of the pulmonic trunk (3.3 cm in diameter), concerning for pulmonary arterial hypertension. 3. Severe hepatic steatosis. 4. Multiple nodular appearing soft tissue densities in the distal third of the esophagus, concerning for multiple small polyps. Further evaluation with nonemergent endoscopy should be considered in the near future to better evaluate these findings. Electronically Signed   By: Trudie Reed M.D.   On: 08/10/2017 15:16   Ct Maxillofacial Ltd Wo Cm  Result Date: 08/10/2017 CLINICAL DATA:  Postnasal drip. Recurrent sinusitis. Productive cough. Sinus pressure and drainage. EXAM: CT PARANASAL SINUS LIMITED WITHOUT CONTRAST TECHNIQUE: Non-contiguous multidetector CT images of the paranasal sinuses were obtained in a single plane without contrast. COMPARISON:  Head CT 10/02/2016 FINDINGS: The visualized portions of the paranasal sinuses are clear without evidence of significant mucosal thickening or fluid. Left petrous apex pneumatization extends anteriorly into the body of the sphenoid bone. The visualized mastoid air cells are clear. The regional soft tissues are grossly unremarkable. IMPRESSION: Clear sinuses. Electronically Signed   By: Sebastian Ache M.D.   On: 08/10/2017 12:56     PMFS History: Patient Active Problem List   Diagnosis Date Noted  . CAP (community acquired pneumonia) 08/03/2017  . Acute frontal sinusitis 12/15/2016  . Metatarsal stress fracture,  right, sequela 11/17/2016  . Neck muscle spasm 10/05/2016  . Nipple discharge in female 09/13/2016  . Depression 08/17/2016  . Anxiety 08/17/2016  . ASCUS with positive high risk HPV cervical 08/14/2016  . Hypertension 08/14/2016  . Long term (current) use of systemic steroids 08/14/2016  . Esophageal reflux 12/29/2015  . Morbid (severe) obesity due to excess calories (HCC) 12/29/2015  . Chronic pain of right knee 12/29/2015  . Chronic pain of left ankle 12/29/2015  . 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  Asthma, moderate persistent 12/09/2015   Past Medical History:  Diagnosis Date  . Anxiety   . Asthma   . Depression   . Hypertension   . Obesity   . Obesity   . Seizures (HCC)   . Severe needle phobia     Family History  Problem Relation Age of Onset  . Hypothyroidism Mother   . Diabetes Father   . Diabetes Paternal Uncle   . Hypertension Maternal Grandmother   . Hypertension Maternal Grandfather   . Pancreatic cancer Maternal Grandfather   . Hypertension Paternal Grandmother   . Heart disease Paternal Grandfather   . Hypertension Paternal Grandfather     Past Surgical History:  Procedure Laterality Date  . TONSILLECTOMY     Social History   Occupational History  . Not on file  Tobacco Use  . Smoking status: Never Smoker  . Smokeless tobacco: Never Used  Substance and Sexual Activity  . Alcohol use: No  . Drug use: No  . Sexual activity: Yes    Birth control/protection: None

## 2017-08-14 ENCOUNTER — Ambulatory Visit: Payer: Self-pay | Admitting: Adult Health

## 2017-08-14 ENCOUNTER — Encounter: Payer: Self-pay | Admitting: Pulmonary Disease

## 2017-08-14 ENCOUNTER — Ambulatory Visit (INDEPENDENT_AMBULATORY_CARE_PROVIDER_SITE_OTHER): Payer: No Typology Code available for payment source | Admitting: Pulmonary Disease

## 2017-08-14 VITALS — BP 130/62 | HR 113 | Ht 64.0 in | Wt >= 6400 oz

## 2017-08-14 DIAGNOSIS — J4541 Moderate persistent asthma with (acute) exacerbation: Secondary | ICD-10-CM

## 2017-08-14 LAB — NITRIC OXIDE

## 2017-08-14 MED ORDER — MONTELUKAST SODIUM 10 MG PO TABS
10.0000 mg | ORAL_TABLET | Freq: Every day | ORAL | 11 refills | Status: DC
Start: 2017-08-14 — End: 2017-09-08

## 2017-08-14 MED FILL — ?PANTOPRAZOLE SOD DR 40MG T: 40 | 30 days supply | Qty: 30 | Fill #0

## 2017-08-14 MED FILL — PROMETHAZINE 25 MG TABLET: 25 | 30 days supply | Qty: 90 | Fill #6

## 2017-08-14 MED FILL — ?HYDROCHLOROTHIAZIDE 12.5MG: 12.5 | 30 days supply | Qty: 30 | Fill #11

## 2017-08-14 MED FILL — ?DICLOFENAC SOD DR 75 MG TA: 75 | 30 days supply | Qty: 60 | Fill #1

## 2017-08-14 MED FILL — ?CETIRIZINE HCL 10 MG TABLE: 10 | 30 days supply | Qty: 30 | Fill #8

## 2017-08-14 MED FILL — DULoxetine HCL 30 MG CPEP: 30 | 30 days supply | Qty: 30 | Fill #1

## 2017-08-14 MED FILL — GABAPENTIN 300 MG CAPSULE: 300 | 30 days supply | Qty: 120 | Fill #3

## 2017-08-14 MED FILL — ?TIZANIDINE HCL 4MG TABLETS: 4 | 15 days supply | Qty: 60 | Fill #2

## 2017-08-14 NOTE — Patient Instructions (Signed)
We will reschedule your pulmonary function test and sleep study We have ordered an echocardiogram to look for pulmonary hypertension and scheduled to see GI for scan that shows abnormalities in your esophagus. Continue the symbicort. We will call in singulair Follow up in 3 monthns

## 2017-08-14 NOTE — Progress Notes (Signed)
Courtney Zamora    [id number]    22-Apr-1986  Primary Care Physician:Johnson, Binnie Rail, MD  Referring Physician: Marcine Matar, MD [address] [place], [place] [address]  Chief complaint: Follow-up for asthma  HPI: 32 year old with history of anxiety, asthma, depression, obesity, degenerative joint disease.  She has a diagnosis of exercise-induced asthma childhood asthma.  She has difficulties when seasons change.  Sensitive to strong perfumes, cinnamon, cleaning with liquids.  Has complains of dyspnea with activity for the past few months.  She denies dyspnea at rest or wheezing.  No cough, sputum production.  No significant problems with snoring.  Denies daytime sleepiness. She has been on Symbicort for the past 2 years but cannot tell if it is helping.  She has albuterol inhaler which she uses occasionally.  Has persistent sinus infection, upper respiratory tract infection since Thanksgiving 2018.  Treated with amoxicillin with no improvement.  Got doxycycline but had to stop after she developed a rash.  She then got Z-Pak and Levaquin in Jan with prednisone tapers.   Pets: Has a number of animals at home including snakes, lizards, hedgehogs, ferrets, dogs Occupation: Currently unemployed Exposures: No relevant exposure, no mold at home Smoking history: non smoker Travel History: Not relevant  Interim History: Symptoms are slightly improved.  She had a CT chest and sinuses and is here for review. Ordered PFT and sleep study at last visit but this could not be completed as she was sick.. Referred to GI for CT scan findings suggestive of esophageal polyps.  Outpatient Encounter Medications as of 08/14/2017  Medication Sig  . acetaminophen-codeine (TYLENOL #3) 300-30 MG tablet TAKE 1 TABLET BY MOUTH EVERY 6 HOURS AS NEEDED FOR MODERATE PAIN  . albuterol (PROVENTIL HFA;VENTOLIN HFA) 108 (90 Base) MCG/ACT inhaler Inhale 2 puffs into the lungs every 6 (six) hours as  needed for wheezing or shortness of breath (wheezing). For shortness of breath.  Marland Kitchen albuterol (PROVENTIL) (2.5 MG/3ML) 0.083% nebulizer solution Take 3 mLs (2.5 mg total) by nebulization every 6 (six) hours as needed for wheezing or shortness of breath.  . ALPRAZolam (XANAX) 0.25 MG tablet Take 1 tablet (0.25 mg total) by mouth every 4 (four) hours. Prior to CT  . budesonide-formoterol (SYMBICORT) 160-4.5 MCG/ACT inhaler Inhale 2 puffs into the lungs 2 (two) times daily.  . Calcium Citrate 200 MG TABS Take 2 tablets (400 mg total) by mouth every other day.  . cetirizine (ZYRTEC) 10 MG tablet Take 1 tablet (10 mg total) by mouth daily.  . Cholecalciferol (VITAMIN D3) 5000 units CAPS Take 5,000 Units by mouth once a week.  . diclofenac (VOLTAREN) 75 MG EC tablet Take 1 tablet (75 mg total) by mouth 2 (two) times daily as needed.  . diclofenac sodium (VOLTAREN) 1 % GEL Apply 2 g topically 4 (four) times daily.  . DULoxetine (CYMBALTA) 30 MG capsule Take 1 capsule (30 mg total) by mouth 2 (two) times daily.  . fluticasone (FLONASE) 50 MCG/ACT nasal spray Place 2 sprays into both nostrils 2 (two) times daily.  . furosemide (LASIX) 20 MG tablet TAKE 1 TABLET (20 MG TOTAL) BY MOUTH DAILY AS NEEDED.  Marland Kitchen gabapentin (NEURONTIN) 300 MG capsule 1 tab PO Qa.m and noon and 2 tabs Q p.m  . hydrochlorothiazide (HYDRODIURIL) 12.5 MG tablet Take 1 tablet (12.5 mg total) by mouth daily.  Marland Kitchen ipratropium-albuterol (DUONEB) 0.5-2.5 (3) MG/3ML SOLN Take 3 mLs by nebulization every 6 (six) hours  as needed.  . montelukast (SINGULAIR) 10 MG tablet Take 1 tablet (10 mg total) by mouth daily.  . Multiple Vitamins-Minerals (WOMENS MULTI) CAPS Take 1 capsule by mouth daily.  . pantoprazole (PROTONIX) 40 MG tablet Take 40 mg by mouth daily.  . phentermine 30 MG capsule Take 1 capsule (30 mg total) by mouth every morning.  . promethazine (PHENERGAN) 25 MG tablet Take 1 tablet (25 mg total) by mouth every 8 (eight) hours as needed  for nausea or vomiting.  Marland Kitchen Respiratory Therapy Supplies (FLUTTER) DEVI As directed.  Marland Kitchen Spacer/Aero-Holding Chambers (AEROCHAMBER MV) inhaler Use as instructed  . tiZANidine (ZANAFLEX) 4 MG tablet TAKE 1 TABLET BY MOUTH EVERY 6 HOURS AS NEEDED FOR MUSCLE SPASMS.  . [DISCONTINUED] predniSONE (DELTASONE) 10 MG tablet 4 tabs x 3 days, 3 tabs x 3 days, 2 tabs x 3 days, 1 tab x 3 days then stop (Patient not taking: Reported on 08/14/2017)   No facility-administered encounter medications on file as of 08/14/2017.     Allergies as of 08/14/2017 - Review Complete 08/14/2017  Allergen Reaction Noted  . Bee venom Anaphylaxis 07/29/2013  . Cinnamon Anaphylaxis 09/16/2012  . Doxycycline Hives 07/19/2017  . Ibuprofen Nausea Only 11/12/2013  . Lexapro [escitalopram oxalate]  03/17/2017  . Tape Hives and Rash 10/02/2016    Past Medical History:  Diagnosis Date  . Anxiety   . Asthma   . Depression   . Hypertension   . Obesity   . Obesity   . Seizures (HCC)   . Severe needle phobia     Past Surgical History:  Procedure Laterality Date  . TONSILLECTOMY      Family History  Problem Relation Age of Onset  . Hypothyroidism Mother   . Diabetes Father   . Diabetes Paternal Uncle   . Hypertension Maternal Grandmother   . Hypertension Maternal Grandfather   . Pancreatic cancer Maternal Grandfather   . Hypertension Paternal Grandmother   . Heart disease Paternal Grandfather   . Hypertension Paternal Grandfather     Social History   Socioeconomic History  . Marital status: Married    Spouse name: Not on file  . Number of children: Not on file  . Years of education: Not on file  . Highest education level: Not on file  Social Needs  . Financial resource strain: Not on file  . Food insecurity - worry: Not on file  . Food insecurity - inability: Not on file  . Transportation needs - medical: Not on file  . Transportation needs - non-medical: Not on file  Occupational History  . Not on  file  Tobacco Use  . Smoking status: Never Smoker  . Smokeless tobacco: Never Used  Substance and Sexual Activity  . Alcohol use: No  . Drug use: No  . Sexual activity: Yes    Birth control/protection: None  Other Topics Concern  . Not on file  Social History Narrative  . Not on file   Review of systems: Review of Systems  Constitutional: Negative for fever and chills.  HENT: Negative.   Eyes: Negative for blurred vision.  Respiratory: as per HPI  Cardiovascular: Negative for chest pain and palpitations.  Gastrointestinal: Negative for vomiting, diarrhea, blood per rectum. Genitourinary: Negative for dysuria, urgency, frequency and hematuria.  Musculoskeletal: Negative for myalgias, back pain and joint pain.  Skin: Negative for itching and rash.  Neurological: Negative for dizziness, tremors, focal weakness, seizures and loss of consciousness.  Endo/Heme/Allergies: Negative for environmental allergies.  Psychiatric/Behavioral: Negative for depression, suicidal ideas and hallucinations.  All other systems reviewed and are negative.  Physical Exam: Blood pressure 130/62, pulse (!) 113, height 5\' 4"  (1.626 m), weight (!) 405 lb (183.7 kg), SpO2 92 %. Gen:      No acute distress HEENT:  EOMI, sclera anicteric Neck:     No masses; no thyromegaly Lungs:    Clear to auscultation bilaterally; normal respiratory effort CV:         Regular rate and rhythm; no murmurs Abd:      + bowel sounds; soft, non-tender; no palpable masses, no distension Ext:    No edema; adequate peripheral perfusion Skin:      Warm and dry; no rash Neuro: alert and oriented x 3 Psych: normal mood and affect  Data Reviewed: FENO 05/01/17-7 FENO 06/19/17- 5 FENO 07/28/17- 7 FENO 08/14/17-5  Chest x-ray 12/09/16-no acute abnormality.  Chest x-ray 07/19/17-no acute abnormality. Chest x-ray 07/28/17-possible right basilar infiltrate Chest x-ray 08/03/17-atelectasis/infiltrate in the right infrahilar region Chest  x-ray 08/08/17- infiltrate in the right infrahilar region has cleared.  No other lung abnormalities.  CT chest 08/10/17-very mild nonspecific groundglass opacity.  Dilation of pulmonic trunk with possible pulmonary arterial hypertension, hepatic steatosis, nodular densities in the distal third of the esophagus concerning for small polyps.  I have reviewed the images personally  Spirometry 05/01/17 FVC 2.53 [67%], FEV1 2.28 [72%], F/F 90 No obstruction, restriction possible.  Assessment:  Acute sinusitis, URTI Has been treated with multiple courses of antibiotic CT sinuses is normal Reccomended saline nasal sprays.   Asthma She has a diagnosis of childhood asthma and exercise-induced asthma.  However the symptoms are not really typical and FENO is low, spirometry does not show any obstruction.  Suspect that her weight and lack of exercise may be contributing to her symptoms.  She is limited the amount of exercise she can do due to degenerative joint disease. Continue current inhalers and DuoNeb's as needed for rescue therapy.  Continue Singulair  CT scan reviewed with very nonspecific groundglass opacities.  This may represent mild inflammation from hypersensitivity pneumonitis but is not impressive by my review. We are unable to get labs including CBC with diff to look for eosinophils, HP panel, allergy profile, IgE levels as she refused saying that she has seizures every time she has a blood draw.  Suggested that exposure to animals at home may be causing her symptoms but she does not want to get rid of them. States that she has the animals for several years without any problem.  Avoid further prednisone treatments as she has history of asking for prednisone to treat her joint pain and not dyspnea. (reviewed Dr 05/03/17, PCP's note Sept 2018)  Suspected sleep apnea Reschedule sleep study  Suspected pulmonary HTN Echo ordered.   Plan/Recommendations: - Continue Symbicort, Albuterol,  singulair - Continue mucinex, flutter valve  - Saline nasal spray - Reschedule PFTs and sleep study.  07-25-1987 MD [facility] Pager [phone] 08/14/2017, 3:17 PM  CC: 08/16/2017, MD

## 2017-08-15 ENCOUNTER — Encounter: Payer: Self-pay | Admitting: Internal Medicine

## 2017-08-15 ENCOUNTER — Other Ambulatory Visit: Payer: Self-pay | Admitting: Internal Medicine

## 2017-08-15 DIAGNOSIS — M25561 Pain in right knee: Principal | ICD-10-CM

## 2017-08-15 DIAGNOSIS — M25532 Pain in left wrist: Secondary | ICD-10-CM

## 2017-08-15 DIAGNOSIS — M25562 Pain in left knee: Principal | ICD-10-CM

## 2017-08-15 DIAGNOSIS — G8929 Other chronic pain: Secondary | ICD-10-CM

## 2017-08-15 DIAGNOSIS — M25511 Pain in right shoulder: Secondary | ICD-10-CM

## 2017-08-17 ENCOUNTER — Ambulatory Visit (HOSPITAL_COMMUNITY): Payer: No Typology Code available for payment source | Attending: Cardiology

## 2017-08-17 ENCOUNTER — Other Ambulatory Visit: Payer: Self-pay

## 2017-08-17 DIAGNOSIS — I10 Essential (primary) hypertension: Secondary | ICD-10-CM | POA: Insufficient documentation

## 2017-08-21 ENCOUNTER — Emergency Department (INDEPENDENT_AMBULATORY_CARE_PROVIDER_SITE_OTHER)
Admission: EM | Admit: 2017-08-21 | Discharge: 2017-08-21 | Disposition: A | Discharge status: Home / Self Care | Payer: Self-pay | Source: Home / Self Care | Attending: Family Medicine | Admitting: Family Medicine

## 2017-08-21 ENCOUNTER — Encounter: Payer: Self-pay | Admitting: Emergency Medicine

## 2017-08-21 DIAGNOSIS — J069 Acute upper respiratory infection, unspecified: Secondary | ICD-10-CM

## 2017-08-21 DIAGNOSIS — R05 Cough: Secondary | ICD-10-CM

## 2017-08-21 DIAGNOSIS — R0982 Postnasal drip: Secondary | ICD-10-CM

## 2017-08-21 DIAGNOSIS — R059 Cough, unspecified: Secondary | ICD-10-CM

## 2017-08-21 MED ORDER — BENZONATATE 100 MG PO CAPS: 100.0000 mg | ORAL_CAPSULE | Freq: Three times a day (TID) | ORAL | 0 refills | Status: DC

## 2017-08-21 MED ORDER — SALINE SPRAY 0.65 % NA SOLN: 1.0000 | NASAL | 0 refills | Status: DC | PRN

## 2017-08-21 MED FILL — BENZONATATE 100 MG CAPSULE: 100 | 3 days supply | Qty: 21 | Fill #0

## 2017-08-21 NOTE — ED Provider Notes (Signed)
[facility]    CSN: [id number] Arrival date & time: 08/21/17  1130     History   Chief Complaint Chief Complaint  Patient presents with  . Cough    HPI Courtney Zamora is a 32 y.o. female.   HPI  Courtney Zamora is a 32 y.o. female presenting to UC with c/o productive cough with thick mucous and SOB that started about 3-4 days ago but worse today.  Per PMH, pt was seen by her Pulmonologist on 08/14/17. It was recommended she f/u with GI for possible esophageal polyps and cardiology for an echo.  She is waiting on the echo results and has f/u with GI in a few weeks.  Denies fever but has had chills. Denies n/v/d. Denies chest pain but has had SOB.  Pt did have a CT chest while at pulmonologist about 1 week ago. Per notes, pulmonologist was not too concerned with CT lung findings.  She was encouraged to continue her Symbicort, albuterol, Singulair, mucinex, saline nasal sprain and schedule PFTs and sleep study.  She has been using inhalers but not the nasal spray.  Denies n/v/d.     Past Medical History:  Diagnosis Date  . Anxiety   . Asthma   . Depression   . Hypertension   . Obesity   . Obesity   . Seizures (HCC)   . Severe needle phobia     Patient Active Problem List   Diagnosis Date Noted  . CAP (community acquired pneumonia) 08/03/2017  . Acute frontal sinusitis 12/15/2016  . Metatarsal stress fracture, right, sequela 11/17/2016  . Neck muscle spasm 10/05/2016  . Nipple discharge in female 09/13/2016  . Depression 08/17/2016  . Anxiety 08/17/2016  . ASCUS with positive high risk HPV cervical 08/14/2016  . Hypertension 08/14/2016  . Long term (current) use of systemic steroids 08/14/2016  . Esophageal reflux 12/29/2015  . Morbid (severe) obesity due to excess calories (HCC) 12/29/2015  . Chronic pain of right knee 12/29/2015  . Chronic pain of left ankle 12/29/2015  . 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 Asthma, moderate persistent 12/09/2015    Past  Surgical History:  Procedure Laterality Date  . TONSILLECTOMY      OB History    Gravida Para Term Preterm AB Living   1       1 0   SAB TAB Ectopic Multiple Live Births   1               Home Medications    Prior to Admission medications   Medication Sig Start Date End Date Taking? Authorizing Provider  acetaminophen-codeine (TYLENOL #3) 300-30 MG tablet TAKE 1 TABLET BY MOUTH EVERY 6 HOURS AS NEEDED FOR MODERATE PAIN 07/27/17   Marcine Matar, MD  albuterol (PROVENTIL HFA;VENTOLIN HFA) 108 (90 Base) MCG/ACT inhaler Inhale 2 puffs into the lungs every 6 (six) hours as needed for wheezing or shortness of breath (wheezing). For shortness of breath. 04/06/17   Marcine Matar, MD  albuterol (PROVENTIL) (2.5 MG/3ML) 0.083% nebulizer solution Take 3 mLs (2.5 mg total) by nebulization every 6 (six) hours as needed for wheezing or shortness of breath. 11/17/16   Funches, Gerilyn Nestle, MD  ALPRAZolam (XANAX) 0.25 MG tablet Take 1 tablet (0.25 mg total) by mouth every 4 (four) hours. Prior to CT 08/09/17   Mannam, Colbert Coyer, MD  benzonatate (TESSALON) 100 MG capsule Take 1-2 capsules (100-200 mg total) by mouth every 8 (eight) hours. 08/21/17   Doroteo Glassman,  Dontaye Hur O, PA-C  budesonide-formoterol (SYMBICORT) 160-4.5 MCG/ACT inhaler Inhale 2 puffs into the lungs 2 (two) times daily. 03/18/16   Funches, Gerilyn Nestle, MD  Calcium Citrate 200 MG TABS Take 2 tablets (400 mg total) by mouth every other day. 10/05/16   Newt Lukes, MD  cetirizine (ZYRTEC) 10 MG tablet Take 1 tablet (10 mg total) by mouth daily. 11/22/16   Storm Frisk, MD  Cholecalciferol (VITAMIN D3) 5000 units CAPS Take 5,000 Units by mouth once a week.    [provider]  diclofenac (VOLTAREN) 75 MG EC tablet Take 1 tablet (75 mg total) by mouth 2 (two) times daily as needed. 05/10/17   Kerrin Champagne, MD  diclofenac sodium (VOLTAREN) 1 % GEL Apply 2 g topically 4 (four) times daily. 05/04/17   Kerrin Champagne, MD  DULoxetine (CYMBALTA)  30 MG capsule Take 1 capsule (30 mg total) by mouth 2 (two) times daily. 08/15/17   Marcine Matar, MD  fluticasone (FLONASE) 50 MCG/ACT nasal spray Place 2 sprays into both nostrils 2 (two) times daily. 07/06/16   Storm Frisk, MD  furosemide (LASIX) 20 MG tablet TAKE 1 TABLET (20 MG TOTAL) BY MOUTH DAILY AS NEEDED. 07/14/17   Marcine Matar, MD  gabapentin (NEURONTIN) 300 MG capsule 1 tab PO Qa.m and noon and 2 tabs Q p.m 05/18/17   Marcine Matar, MD  hydrochlorothiazide (HYDRODIURIL) 12.5 MG tablet Take 1 tablet (12.5 mg total) by mouth daily. 06/22/17   Marcine Matar, MD  ipratropium-albuterol (DUONEB) 0.5-2.5 (3) MG/3ML SOLN Take 3 mLs by nebulization every 6 (six) hours as needed. 07/28/17   Mannam, Colbert Coyer, MD  montelukast (SINGULAIR) 10 MG tablet Take 1 tablet (10 mg total) by mouth daily. 08/09/17 08/09/18  Mannam, Colbert Coyer, MD  montelukast (SINGULAIR) 10 MG tablet Take 1 tablet (10 mg total) by mouth at bedtime. 08/14/17   Chilton Greathouse, MD  Multiple Vitamins-Minerals (WOMENS MULTI) CAPS Take 1 capsule by mouth daily.    [provider]  pantoprazole (PROTONIX) 40 MG tablet Take 40 mg by mouth daily.    [provider]  phentermine 30 MG capsule Take 1 capsule (30 mg total) by mouth every morning. 05/18/17   Marcine Matar, MD  promethazine (PHENERGAN) 25 MG tablet Take 1 tablet (25 mg total) by mouth every 8 (eight) hours as needed for nausea or vomiting. 11/17/16   Dessa Phi, MD  Respiratory Therapy Supplies (FLUTTER) DEVI As directed. 07/28/17   Mannam, Colbert Coyer, MD  sodium chloride (OCEAN) 0.65 % SOLN nasal spray Place 1 spray into both nostrils as needed for congestion. 08/21/17   Lurene Shadow, PA-C  Spacer/Aero-Holding Chambers (AEROCHAMBER MV) inhaler Use as instructed 01/25/17   Storm Frisk, MD  tiZANidine (ZANAFLEX) 4 MG tablet TAKE 1 TABLET BY MOUTH EVERY 6 HOURS AS NEEDED FOR MUSCLE SPASMS. 06/10/17   Marcine Matar, MD     Family History Family History  Problem Relation Age of Onset  . Hypothyroidism Mother   . Diabetes Father   . Diabetes Paternal Uncle   . Hypertension Maternal Grandmother   . Hypertension Maternal Grandfather   . Pancreatic cancer Maternal Grandfather   . Hypertension Paternal Grandmother   . Heart disease Paternal Grandfather   . Hypertension Paternal Grandfather     Social History Social History   Tobacco Use  . Smoking status: Never Smoker  . Smokeless tobacco: Never Used  Substance Use Topics  . Alcohol use: No  .  Drug use: No     Allergies   Bee venom; Cinnamon; Doxycycline; Ibuprofen; Lexapro [escitalopram oxalate]; and Tape   Review of Systems Review of Systems  Constitutional: Positive for chills. Negative for fever.  HENT: Positive for congestion, postnasal drip and rhinorrhea. Negative for ear pain, sore throat, trouble swallowing and voice change.   Respiratory: Positive for cough. Negative for shortness of breath.   Cardiovascular: Negative for chest pain and palpitations.  Gastrointestinal: Negative for abdominal pain, diarrhea, nausea and vomiting.  Musculoskeletal: Negative for arthralgias, back pain and myalgias.  Skin: Negative for rash.     Physical Exam Triage Vital Signs ED Triage Vitals  Enc Vitals Group     BP 08/21/17 1212 115/74     Pulse Rate 08/21/17 1212 100     Resp --      Temp 08/21/17 1212 98 F (36.7 C)     Temp Source 08/21/17 1212 Oral     SpO2 08/21/17 1212 98 %     Weight --      Height --      Head Circumference --      Peak Flow --      Pain Score 08/21/17 1213 0     Pain Loc --      Pain Edu? --      Excl. in GC? --    No data found.  Updated Vital Signs BP 115/74 (BP Location: Right Arm)   Pulse 100   Temp 98 F (36.7 C) (Oral)   SpO2 98%   Visual Acuity Right Eye Distance:   Left Eye Distance:   Bilateral Distance:    Right Eye Near:   Left Eye Near:    Bilateral Near:     Physical Exam   Constitutional: She appears well-developed and well-nourished. No distress.  Morbidly obese female sitting in wheelchair, NAD  HENT:  Head: Normocephalic and atraumatic.  Right Ear: Tympanic membrane normal.  Left Ear: Tympanic membrane normal.  Nose: Nose normal.  Mouth/Throat: Uvula is midline, oropharynx is clear and moist and mucous membranes are normal.  Post-nasal drip noted on exam with thick white mucous. No blood or green/brown mucous noted on exam.  Eyes: Conjunctivae are normal. No scleral icterus.  Neck: Normal range of motion. Neck supple.  Cardiovascular: Normal rate, regular rhythm and normal heart sounds.  Pulmonary/Chest: Effort normal and breath sounds normal. No stridor. No respiratory distress. She has no wheezes. She has no rales. She exhibits no tenderness.  Abdominal: Soft. Bowel sounds are normal. She exhibits no distension and no mass. There is no tenderness. There is no rebound and no guarding.  Musculoskeletal: Normal range of motion.  Neurological: She is alert.  Skin: Skin is warm and dry. She is not diaphoretic.  Nursing note and vitals reviewed.    UC Treatments / Results  Labs (all labs ordered are listed, but only abnormal results are displayed) Labs Reviewed - No data to display  EKG  EKG Interpretation None       Radiology No results found.  Procedures Procedures (including critical care time)  Medications Ordered in UC Medications - No data to display   Initial Impression / Assessment and Plan / UC Course  I have reviewed the triage vital signs and the nursing notes.  Pertinent labs & imaging results that were available during my care of the patient were reviewed by me and considered in my medical decision making (see chart for details).     Hx and  exam c/w viral URI vs allergic in nature given chronic cough and congestion Reassured pt lungs: CTAB Vitals better than they were 1 week ago and better than they were 1 month ago  when pt was seen at [facility] and tx with azithromycin Doubt pneumonia at this time. Doubt PE  Encouraged continued treatment and f/u as directed by her pulmonologist. Encouraged use of nasal saline and nasal rinses to help with post-nasal drip F/u with PCP as needed Discussed symptoms that warrant emergent care in the ED.   Final Clinical Impressions(s) / UC Diagnoses   Final diagnoses:  Upper respiratory tract infection, unspecified type  Post-nasal drip  Cough    ED Discharge Orders        Ordered    sodium chloride (OCEAN) 0.65 % SOLN nasal spray  As needed     08/21/17 1224    benzonatate (TESSALON) 100 MG capsule  Every 8 hours     08/21/17 1224       Controlled Substance Prescriptions [place] Controlled Substance Registry consulted? Not Applicable   Rolla Plate 08/21/17 1333

## 2017-08-21 NOTE — ED Triage Notes (Signed)
Pt c/o cough with mucous and SOB that started today.

## 2017-08-22 ENCOUNTER — Other Ambulatory Visit: Payer: Self-pay

## 2017-08-22 ENCOUNTER — Encounter: Payer: Self-pay | Admitting: Emergency Medicine

## 2017-08-22 ENCOUNTER — Emergency Department
Admission: EM | Admit: 2017-08-22 | Discharge: 2017-08-22 | Disposition: A | Discharge status: Home / Self Care | Payer: No Typology Code available for payment source | Attending: Emergency Medicine | Admitting: Emergency Medicine

## 2017-08-22 ENCOUNTER — Emergency Department: Payer: No Typology Code available for payment source

## 2017-08-22 DIAGNOSIS — J40 Bronchitis, not specified as acute or chronic: Secondary | ICD-10-CM | POA: Insufficient documentation

## 2017-08-22 DIAGNOSIS — I10 Essential (primary) hypertension: Secondary | ICD-10-CM | POA: Insufficient documentation

## 2017-08-22 DIAGNOSIS — Z79899 Other long term (current) drug therapy: Secondary | ICD-10-CM | POA: Insufficient documentation

## 2017-08-22 DIAGNOSIS — J4540 Moderate persistent asthma, uncomplicated: Secondary | ICD-10-CM | POA: Insufficient documentation

## 2017-08-22 LAB — INFLUENZA PANEL BY PCR (TYPE A & B)
INFLAPCR: NEGATIVE
Influenza B By PCR: NEGATIVE

## 2017-08-22 MED ORDER — SULFAMETHOXAZOLE-TRIMETHOPRIM 800-160 MG PO TABS: 1.0000 | ORAL_TABLET | Freq: Two times a day (BID) | ORAL | 0 refills | Status: DC

## 2017-08-22 NOTE — ED Provider Notes (Signed)
[facility] Emergency Department Provider Note   ____________________________________________   First MD Initiated Contact with Patient 08/22/17 1324     (approximate)  I have reviewed the triage vital signs and the nursing notes.   HISTORY  Chief Complaint Cough    HPI Courtney Zamora is a 32 y.o. female patient presents with greenish productive cough.  Patient denies fever but states she has a sore throat.  Patient recently diagnosed and treated for bronchopneumonia.  Patient states she felt better after taking antibiotic for 1 week. Complaint return 4 days ago.  Patient denies nausea, vomiting, diarrhea.  Patient has not taken a flu shot for this season. Past Medical History:  Diagnosis Date  . Anxiety   . Asthma   . Depression   . Hypertension   . Obesity   . Obesity   . Seizures (HCC)   . Severe needle phobia     Patient Active Problem List   Diagnosis Date Noted  . CAP (community acquired pneumonia) 08/03/2017  . Acute frontal sinusitis 12/15/2016  . Metatarsal stress fracture, right, sequela 11/17/2016  . Neck muscle spasm 10/05/2016  . Nipple discharge in female 09/13/2016  . Depression 08/17/2016  . Anxiety 08/17/2016  . ASCUS with positive high risk HPV cervical 08/14/2016  . Hypertension 08/14/2016  . Long term (current) use of systemic steroids 08/14/2016  . Esophageal reflux 12/29/2015  . Morbid (severe) obesity due to excess calories (HCC) 12/29/2015  . Chronic pain of right knee 12/29/2015  . Chronic pain of left ankle 12/29/2015  . 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 Asthma, moderate persistent 12/09/2015    Past Surgical History:  Procedure Laterality Date  . TONSILLECTOMY      Prior to Admission medications   Medication Sig Start Date End Date Taking? Authorizing Provider  acetaminophen-codeine (TYLENOL #3) 300-30 MG tablet TAKE 1 TABLET BY MOUTH EVERY 6 HOURS AS NEEDED FOR MODERATE PAIN 07/27/17   Marcine Matar, MD  albuterol (PROVENTIL HFA;VENTOLIN HFA) 108 (90 Base) MCG/ACT inhaler Inhale 2 puffs into the lungs every 6 (six) hours as needed for wheezing or shortness of breath (wheezing). For shortness of breath. 04/06/17   Marcine Matar, MD  albuterol (PROVENTIL) (2.5 MG/3ML) 0.083% nebulizer solution Take 3 mLs (2.5 mg total) by nebulization every 6 (six) hours as needed for wheezing or shortness of breath. 11/17/16   Funches, Gerilyn Nestle, MD  ALPRAZolam (XANAX) 0.25 MG tablet Take 1 tablet (0.25 mg total) by mouth every 4 (four) hours. Prior to CT 08/09/17   Mannam, Colbert Coyer, MD  benzonatate (TESSALON) 100 MG capsule Take 1-2 capsules (100-200 mg total) by mouth every 8 (eight) hours. 08/21/17   Lurene Shadow, PA-C  budesonide-formoterol (SYMBICORT) 160-4.5 MCG/ACT inhaler Inhale 2 puffs into the lungs 2 (two) times daily. 03/18/16   Funches, Gerilyn Nestle, MD  Calcium Citrate 200 MG TABS Take 2 tablets (400 mg total) by mouth every other day. 10/05/16   Newt Lukes, MD  cetirizine (ZYRTEC) 10 MG tablet Take 1 tablet (10 mg total) by mouth daily. 11/22/16   Storm Frisk, MD  Cholecalciferol (VITAMIN D3) 5000 units CAPS Take 5,000 Units by mouth once a week.    [provider]  diclofenac (VOLTAREN) 75 MG EC tablet Take 1 tablet (75 mg total) by mouth 2 (two) times daily as needed. 05/10/17   Kerrin Champagne, MD  diclofenac sodium (VOLTAREN) 1 % GEL Apply 2 g topically 4 (four) times daily. 05/04/17  Kerrin Champagne, MD  DULoxetine (CYMBALTA) 30 MG capsule Take 1 capsule (30 mg total) by mouth 2 (two) times daily. 08/15/17   Marcine Matar, MD  fluticasone (FLONASE) 50 MCG/ACT nasal spray Place 2 sprays into both nostrils 2 (two) times daily. 07/06/16   Storm Frisk, MD  furosemide (LASIX) 20 MG tablet TAKE 1 TABLET (20 MG TOTAL) BY MOUTH DAILY AS NEEDED. 07/14/17   Marcine Matar, MD  gabapentin (NEURONTIN) 300 MG capsule 1 tab PO Qa.m and noon and 2 tabs Q p.m 05/18/17   Marcine Matar, MD  hydrochlorothiazide (HYDRODIURIL) 12.5 MG tablet Take 1 tablet (12.5 mg total) by mouth daily. 06/22/17   Marcine Matar, MD  ipratropium-albuterol (DUONEB) 0.5-2.5 (3) MG/3ML SOLN Take 3 mLs by nebulization every 6 (six) hours as needed. 07/28/17   Mannam, Colbert Coyer, MD  montelukast (SINGULAIR) 10 MG tablet Take 1 tablet (10 mg total) by mouth daily. 08/09/17 08/09/18  Mannam, Colbert Coyer, MD  montelukast (SINGULAIR) 10 MG tablet Take 1 tablet (10 mg total) by mouth at bedtime. 08/14/17   Chilton Greathouse, MD  Multiple Vitamins-Minerals (WOMENS MULTI) CAPS Take 1 capsule by mouth daily.    [provider]  pantoprazole (PROTONIX) 40 MG tablet Take 40 mg by mouth daily.    [provider]  phentermine 30 MG capsule Take 1 capsule (30 mg total) by mouth every morning. 05/18/17   Marcine Matar, MD  promethazine (PHENERGAN) 25 MG tablet Take 1 tablet (25 mg total) by mouth every 8 (eight) hours as needed for nausea or vomiting. 11/17/16   Dessa Phi, MD  Respiratory Therapy Supplies (FLUTTER) DEVI As directed. 07/28/17   Mannam, Colbert Coyer, MD  sodium chloride (OCEAN) 0.65 % SOLN nasal spray Place 1 spray into both nostrils as needed for congestion. 08/21/17   Lurene Shadow, PA-C  Spacer/Aero-Holding Chambers (AEROCHAMBER MV) inhaler Use as instructed 01/25/17   Storm Frisk, MD  sulfamethoxazole-trimethoprim (BACTRIM DS,SEPTRA DS) 800-160 MG tablet Take 1 tablet by mouth 2 (two) times daily. 08/22/17   Joni Reining, PA-C  tiZANidine (ZANAFLEX) 4 MG tablet TAKE 1 TABLET BY MOUTH EVERY 6 HOURS AS NEEDED FOR MUSCLE SPASMS. 06/10/17   Marcine Matar, MD    Allergies Bee venom; Cinnamon; Doxycycline; Ibuprofen; Lexapro [escitalopram oxalate]; and Tape  Family History  Problem Relation Age of Onset  . Hypothyroidism Mother   . Diabetes Father   . Diabetes Paternal Uncle   . Hypertension Maternal Grandmother   . Hypertension Maternal Grandfather   . Pancreatic  cancer Maternal Grandfather   . Hypertension Paternal Grandmother   . Heart disease Paternal Grandfather   . Hypertension Paternal Grandfather     Social History Social History   Tobacco Use  . Smoking status: Never Smoker  . Smokeless tobacco: Never Used  Substance Use Topics  . Alcohol use: No  . Drug use: No    Review of Systems Constitutional: No fever/chills Eyes: No visual changes. ENT: No sore throat. Cardiovascular: Denies chest pain. Respiratory: Denies shortness of breath. Gastrointestinal: No abdominal pain.  No nausea, no vomiting.  No diarrhea.  No constipation. Genitourinary: Negative for dysuria. Musculoskeletal: Negative for back pain. Skin: Negative for rash. Neurological: Negative for headaches, focal weakness or numbness. Psychiatric:Anxiety and depression Endocrine:Hypertension Hematological/Lymphatic: Allergic/Immunilogical: See medication list ____________________________________________   PHYSICAL EXAM:  VITAL SIGNS: ED Triage Vitals  Enc Vitals Group     BP 08/22/17 1219 130/82     Pulse Rate  08/22/17 1219 96     Resp 08/22/17 1219 18     Temp 08/22/17 1219 98.8 F (37.1 C)     Temp Source 08/22/17 1219 Oral     SpO2 08/22/17 1219 96 %     Weight --      Height --      Head Circumference --      Peak Flow --      Pain Score 08/22/17 1223 5     Pain Loc --      Pain Edu? --      Excl. in GC? --    Constitutional: Alert and oriented. Well appearing and in no acute distress.  Morbid obesity Eyes: Conjunctivae are normal. PERRL. EOMI. Head: Atraumatic. Nose: Edematous nasal turbinates clear rhinorrhea Mouth/Throat: Mucous membranes are moist.  Oropharynx non-erythematous. Neck: No stridor.   Cardiovascular: Normal rate, regular rhythm. Grossly normal heart sounds.  Good peripheral circulation. Respiratory: Normal respiratory effort.  No retractions. Lungs CTAB. Neurologic:  Normal speech and language. No gross focal neurologic  deficits are appreciated. No gait instability. Skin:  Skin is warm, dry and intact. No rash noted. Psychiatric: Mood and affect are normal. Speech and behavior are normal.  ____________________________________________   LABS (all labs ordered are listed, but only abnormal results are displayed)  Labs Reviewed  INFLUENZA PANEL BY PCR (TYPE A & B)   ____________________________________________  EKG   ____________________________________________  RADIOLOGY  ED MD interpretation: No acute findings on chest x-ray Official radiology report(s): Dg Chest 2 View  Result Date: 08/22/2017 CLINICAL DATA:  32 year old female with productive cough. Subsequent encounter. EXAM: CHEST  2 VIEW COMPARISON:  08/10/2017 chest CT.  08/08/2017 chest x-ray. FINDINGS: No segmental consolidation. CT detected subtle parenchymal opacities below the threshold of plain film detection. No pulmonary edema.  No pneumothorax. Heart size within normal limits. No acute osseous abnormality. IMPRESSION: No acute pulmonary abnormality noted. Electronically Signed   By: Lacy Duverney M.D.   On: 08/22/2017 13:55    ____________________________________________   PROCEDURES  Procedure(s) performed: None  Procedures  Critical Care performed: No  ____________________________________________   INITIAL IMPRESSION / ASSESSMENT AND PLAN / ED COURSE  As part of my medical decision making, I reviewed the following data within the electronic MEDICAL RECORD NUMBER    Cough secondary to bronchitis.  Patient advised to continue taking cough medication.  Patient flu test was negative and no acute findings on chest x-ray.  Advised to follow-up PCP 1 week.     ____________________________________________   FINAL CLINICAL IMPRESSION(S) / ED DIAGNOSES  Final diagnoses:  Bronchitis     ED Discharge Orders        Ordered    sulfamethoxazole-trimethoprim (BACTRIM DS,SEPTRA DS) 800-160 MG tablet  2 times daily      08/22/17 1505       Note:  This document was prepared using Dragon voice recognition software and may include unintentional dictation errors.    Joni Reining, PA-C 08/22/17 1509    Emily Filbert, MD 08/22/17 [phone]

## 2017-08-22 NOTE — Discharge Instructions (Signed)
Continue previous cough medication and start Bactrim DS as directed.

## 2017-08-22 NOTE — ED Triage Notes (Addendum)
Presents with prod cough  Greenish phlegm  No fever also having a slight sore throat  Has had recent sinus infection and pneumonia    Felt better  Sx's returned on sunday

## 2017-08-22 NOTE — ED Notes (Signed)
Pt discharged to home.  Family member driving.  Discharge instructions reviewed.  Verbalized understanding.  No questions or concerns at this time.  Teach back verified.  Pt in NAD.  No items left in ED.   

## 2017-08-25 ENCOUNTER — Encounter: Payer: Self-pay | Admitting: *Deleted

## 2017-08-31 MED FILL — ACETAMINOPHEN/COD #3 TABLET: 300-30 | 30 days supply | Qty: 120 | Fill #1

## 2017-08-31 MED FILL — SULFAMETHOXAZOLE-TMP DS TAB: 800-160 | 10 days supply | Qty: 20 | Fill #0

## 2017-09-04 MED FILL — DULoxetine HCL 30 MG CPEP: 30 | 30 days supply | Qty: 30 | Fill #2

## 2017-09-05 ENCOUNTER — Emergency Department (HOSPITAL_BASED_OUTPATIENT_CLINIC_OR_DEPARTMENT_OTHER): Payer: Medicaid Other

## 2017-09-05 ENCOUNTER — Encounter (HOSPITAL_BASED_OUTPATIENT_CLINIC_OR_DEPARTMENT_OTHER): Payer: Self-pay | Admitting: Emergency Medicine

## 2017-09-05 ENCOUNTER — Other Ambulatory Visit: Payer: Self-pay

## 2017-09-05 ENCOUNTER — Inpatient Hospital Stay (HOSPITAL_BASED_OUTPATIENT_CLINIC_OR_DEPARTMENT_OTHER)
Admission: EM | Admit: 2017-09-05 | Discharge: 2017-09-08 | DRG: 202 | Disposition: A | Discharge status: Home / Self Care | Payer: Medicaid Other | Attending: Internal Medicine | Admitting: Internal Medicine

## 2017-09-05 DIAGNOSIS — K760 Fatty (change of) liver, not elsewhere classified: Secondary | ICD-10-CM | POA: Diagnosis present

## 2017-09-05 DIAGNOSIS — Z833 Family history of diabetes mellitus: Secondary | ICD-10-CM

## 2017-09-05 DIAGNOSIS — Z800 Family history of malignant neoplasm of digestive organs: Secondary | ICD-10-CM

## 2017-09-05 DIAGNOSIS — Z6841 Body Mass Index (BMI) 40.0 and over, adult: Secondary | ICD-10-CM

## 2017-09-05 DIAGNOSIS — Z8701 Personal history of pneumonia (recurrent): Secondary | ICD-10-CM

## 2017-09-05 DIAGNOSIS — J45901 Unspecified asthma with (acute) exacerbation: Secondary | ICD-10-CM

## 2017-09-05 DIAGNOSIS — R112 Nausea with vomiting, unspecified: Secondary | ICD-10-CM

## 2017-09-05 DIAGNOSIS — E8770 Fluid overload, unspecified: Secondary | ICD-10-CM | POA: Diagnosis present

## 2017-09-05 DIAGNOSIS — Z8249 Family history of ischemic heart disease and other diseases of the circulatory system: Secondary | ICD-10-CM

## 2017-09-05 DIAGNOSIS — Z7951 Long term (current) use of inhaled steroids: Secondary | ICD-10-CM

## 2017-09-05 DIAGNOSIS — I10 Essential (primary) hypertension: Secondary | ICD-10-CM | POA: Diagnosis present

## 2017-09-05 DIAGNOSIS — E119 Type 2 diabetes mellitus without complications: Secondary | ICD-10-CM

## 2017-09-05 DIAGNOSIS — R0602 Shortness of breath: Secondary | ICD-10-CM

## 2017-09-05 DIAGNOSIS — M1710 Unilateral primary osteoarthritis, unspecified knee: Secondary | ICD-10-CM | POA: Diagnosis present

## 2017-09-05 DIAGNOSIS — I4892 Unspecified atrial flutter: Secondary | ICD-10-CM | POA: Diagnosis present

## 2017-09-05 DIAGNOSIS — J4540 Moderate persistent asthma, uncomplicated: Secondary | ICD-10-CM | POA: Diagnosis present

## 2017-09-05 DIAGNOSIS — R000 Tachycardia, unspecified: Secondary | ICD-10-CM | POA: Diagnosis present

## 2017-09-05 DIAGNOSIS — K219 Gastro-esophageal reflux disease without esophagitis: Secondary | ICD-10-CM | POA: Diagnosis present

## 2017-09-05 DIAGNOSIS — R55 Syncope and collapse: Secondary | ICD-10-CM | POA: Diagnosis present

## 2017-09-05 DIAGNOSIS — F329 Major depressive disorder, single episode, unspecified: Secondary | ICD-10-CM | POA: Diagnosis present

## 2017-09-05 DIAGNOSIS — E1165 Type 2 diabetes mellitus with hyperglycemia: Secondary | ICD-10-CM | POA: Diagnosis present

## 2017-09-05 DIAGNOSIS — J189 Pneumonia, unspecified organism: Secondary | ICD-10-CM | POA: Diagnosis present

## 2017-09-05 DIAGNOSIS — F419 Anxiety disorder, unspecified: Secondary | ICD-10-CM | POA: Diagnosis present

## 2017-09-05 DIAGNOSIS — J4541 Moderate persistent asthma with (acute) exacerbation: Principal | ICD-10-CM | POA: Diagnosis present

## 2017-09-05 LAB — LIPID PANEL
Cholesterol: 164 mg/dL (ref 0–200)
HDL: 58 mg/dL (ref >40)
LDL Cholesterol: 84 mg/dL (ref 0–99)
Total CHOL/HDL Ratio: 2.8 RATIO
Triglycerides: 109 mg/dL (ref <150)
VLDL: 22 mg/dL (ref 0–40)

## 2017-09-05 LAB — CBC WITH DIFFERENTIAL/PLATELET
Basophils Absolute: 0 10*3/uL (ref 0.0–0.1)
Basophils Relative: 0 %
Eosinophils Absolute: 0 10*3/uL (ref 0.0–0.7)
Eosinophils Relative: 0 %
HCT: 38.7 % (ref 36.0–46.0)
Hemoglobin: 11.5 g/dL — ABNORMAL LOW (ref 12.0–15.0)
Lymphocytes Relative: 13 %
Lymphs Abs: 1.3 10*3/uL (ref 0.7–4.0)
MCH: 25.1 pg — ABNORMAL LOW (ref 26.0–34.0)
MCHC: 29.7 g/dL — ABNORMAL LOW (ref 30.0–36.0)
MCV: 84.3 fL (ref 78.0–100.0)
Monocytes Absolute: 0.2 10*3/uL (ref 0.1–1.0)
Monocytes Relative: 2 %
Neutro Abs: 8.3 10*3/uL — ABNORMAL HIGH (ref 1.7–7.7)
Neutrophils Relative %: 85 %
Platelets: 340 10*3/uL (ref 150–400)
RBC: 4.59 MIL/uL (ref 3.87–5.11)
RDW: 16.5 % — ABNORMAL HIGH (ref 11.5–15.5)
WBC: 9.9 10*3/uL (ref 4.0–10.5)

## 2017-09-05 LAB — HEMOGLOBIN A1C
Hgb A1c MFr Bld: 9.6 % — ABNORMAL HIGH (ref 4.8–5.6)
Mean Plasma Glucose: 228.82 mg/dL

## 2017-09-05 LAB — BASIC METABOLIC PANEL
Anion gap: 12 (ref 5–15)
BUN: 16 mg/dL (ref 6–20)
CO2: 26 mmol/L (ref 22–32)
Calcium: 9.1 mg/dL (ref 8.9–10.3)
Chloride: 97 mmol/L — ABNORMAL LOW (ref 101–111)
Creatinine, Ser: 0.73 mg/dL (ref 0.44–1.00)
GFR calc Af Amer: >60 mL/min (ref >60)
GFR calc non Af Amer: >60 mL/min (ref >60)
Glucose, Bld: 296 mg/dL — ABNORMAL HIGH (ref 65–99)
Potassium: 4.2 mmol/L (ref 3.5–5.1)
Sodium: 135 mmol/L (ref 135–145)

## 2017-09-05 LAB — D-DIMER, QUANTITATIVE: D-Dimer, Quant: >20 ug/mL-FEU — ABNORMAL HIGH (ref 0.00–0.50)

## 2017-09-05 LAB — TSH: TSH: 1.646 u[IU]/mL (ref 0.350–4.500)

## 2017-09-05 LAB — HEPATIC FUNCTION PANEL
ALBUMIN: 3.5 g/dL (ref 3.5–5.0)
ALT: 86 U/L — ABNORMAL HIGH (ref 14–54)
AST: 61 U/L — ABNORMAL HIGH (ref 15–41)
Alkaline Phosphatase: 79 U/L (ref 38–126)
BILIRUBIN TOTAL: 0.5 mg/dL (ref 0.3–1.2)
Bilirubin, Direct: 0.2 mg/dL (ref 0.1–0.5)
Indirect Bilirubin: 0.3 mg/dL (ref 0.3–0.9)
TOTAL PROTEIN: 8.4 g/dL — AB (ref 6.5–8.1)

## 2017-09-05 LAB — HCG, QUANTITATIVE, PREGNANCY: hCG, Beta Chain, Quant, S: 1 m[IU]/mL (ref <5)

## 2017-09-05 LAB — TROPONIN I: Troponin I: <0.03 ng/mL (ref <0.03)

## 2017-09-05 MED ORDER — PANTOPRAZOLE SODIUM 40 MG PO TBEC
40.0000 mg | DELAYED_RELEASE_TABLET | Freq: Once | ORAL | Status: AC
  Administered 2017-09-05: 40 mg via ORAL
  Filled 2017-09-05: qty 1

## 2017-09-05 MED ORDER — ALBUTEROL SULFATE (2.5 MG/3ML) 0.083% IN NEBU
2.5000 mg | INHALATION_SOLUTION | Freq: Once | RESPIRATORY_TRACT | Status: AC
  Administered 2017-09-05: 2.5 mg via RESPIRATORY_TRACT
  Filled 2017-09-05: qty 3

## 2017-09-05 MED ORDER — SODIUM CHLORIDE 0.9 % IV SOLN
500.0000 mg | Freq: Once | INTRAVENOUS | Status: AC
  Administered 2017-09-05: 500 mg via INTRAVENOUS
  Filled 2017-09-05: qty 500

## 2017-09-05 MED ORDER — SODIUM CHLORIDE 0.9 % IV SOLN
1.0000 g | Freq: Once | INTRAVENOUS | Status: AC
  Administered 2017-09-05: 1 g via INTRAVENOUS
  Filled 2017-09-05: qty 10

## 2017-09-05 MED ORDER — HYDROCODONE-ACETAMINOPHEN 5-325 MG PO TABS
1.0000 | ORAL_TABLET | Freq: Once | ORAL | Status: AC
  Administered 2017-09-05: 1 via ORAL
  Filled 2017-09-05: qty 1

## 2017-09-05 MED ORDER — SODIUM CHLORIDE 0.9 % IV BOLUS (SEPSIS)
1000.0000 mL | Freq: Once | INTRAVENOUS | Status: AC
  Administered 2017-09-05: 1000 mL via INTRAVENOUS

## 2017-09-05 MED ORDER — SODIUM CHLORIDE 0.9 % IV BOLUS (SEPSIS): 1000.0000 mL | Freq: Once | INTRAVENOUS | Status: DC

## 2017-09-05 MED ORDER — IOPAMIDOL (ISOVUE-370) INJECTION 76%
100.0000 mL | Freq: Once | INTRAVENOUS | Status: AC | PRN
  Administered 2017-09-05: 100 mL via INTRAVENOUS

## 2017-09-05 MED ORDER — IPRATROPIUM-ALBUTEROL 0.5-2.5 (3) MG/3ML IN SOLN
3.0000 mL | Freq: Once | RESPIRATORY_TRACT | Status: AC
  Administered 2017-09-05: 3 mL via RESPIRATORY_TRACT
  Filled 2017-09-05: qty 3

## 2017-09-05 MED ORDER — LORAZEPAM 1 MG PO TABS
1.0000 mg | ORAL_TABLET | Freq: Once | ORAL | Status: AC
  Administered 2017-09-05: 1 mg via ORAL
  Filled 2017-09-05: qty 1

## 2017-09-05 MED ORDER — AZITHROMYCIN 500 MG IV SOLR
INTRAVENOUS | Status: AC
  Filled 2017-09-05: qty 500

## 2017-09-05 MED ORDER — PREDNISONE 10 MG (21) PO TBPK: ORAL_TABLET | Freq: Every day | ORAL | 0 refills | Status: DC

## 2017-09-05 MED ORDER — PREDNISONE 50 MG PO TABS
60.0000 mg | ORAL_TABLET | Freq: Once | ORAL | Status: AC
  Administered 2017-09-05: 60 mg via ORAL
  Filled 2017-09-05: qty 1

## 2017-09-05 MED FILL — predniSONE 10 MG TABS: 10 | 12 days supply | Qty: 42 | Fill #0

## 2017-09-05 NOTE — ED Provider Notes (Signed)
[facility] Provider Note   CSN: [id number] Arrival date & time: 09/05/17  1246     History   Chief Complaint Chief Complaint  Patient presents with  . Shortness of Breath    HPI Courtney Zamora is a 32 y.o. female with history of asthma, anxiety, depression, hypertension, obesity, and severe needle phobia who presents with 6-month history of intermittent cough and shortness of breath.  Patient reports over the past few days she has become a lot more short of breath, especially on exertion.  She has had associated lightheadedness.  She has had a course of prednisone in January which somewhat improved her symptoms, however has not completely resolved it.  She had associated chest pain, worse with taking deep breath and coughing.  She denies any new leg pain or swelling or recent long trips or surgeries.  HPI  Past Medical History:  Diagnosis Date  . Anxiety   . Asthma   . Depression   . Essential hypertension   . GERD (gastroesophageal reflux disease)   . Obesity   . Osteoarthritis, knee   . Seizures (HCC)   . Severe needle phobia     Patient Active Problem List   Diagnosis Date Noted  . DM2 (diabetes mellitus, type 2) (HCC) 09/06/2017  . CAP (community acquired pneumonia) 08/03/2017  . Acute frontal sinusitis 12/15/2016  . Metatarsal stress fracture, right, sequela 11/17/2016  . Neck muscle spasm 10/05/2016  . Nipple discharge in female 09/13/2016  . Depression 08/17/2016  . Anxiety 08/17/2016  . ASCUS with positive high risk HPV cervical 08/14/2016  . Hypertension 08/14/2016  . Long term (current) use of systemic steroids 08/14/2016  . Esophageal reflux 12/29/2015  . Morbid (severe) obesity due to excess calories (HCC) 12/29/2015  . Chronic pain of right knee 12/29/2015  . Chronic pain of left ankle 12/29/2015  . 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 Asthma, moderate persistent 12/09/2015    Past Surgical History:    Procedure Laterality Date  . TONSILLECTOMY      OB History    Gravida Para Term Preterm AB Living   1       1 0   SAB TAB Ectopic Multiple Live Births   1               Home Medications    Prior to Admission medications   Medication Sig Start Date End Date Taking? Authorizing Provider  acetaminophen-codeine (TYLENOL #3) 300-30 MG tablet Take 1 tablet by mouth every 4 (four) hours as needed for moderate pain.   Yes [provider]  albuterol (PROVENTIL HFA;VENTOLIN HFA) 108 (90 Base) MCG/ACT inhaler Inhale 2 puffs into the lungs every 6 (six) hours as needed for wheezing or shortness of breath (wheezing). For shortness of breath. 04/06/17  Yes Marcine Matar, MD  albuterol (PROVENTIL) (2.5 MG/3ML) 0.083% nebulizer solution Take 3 mLs (2.5 mg total) by nebulization every 6 (six) hours as needed for wheezing or shortness of breath. 11/17/16  Yes Funches, Josalyn, MD  benzonatate (TESSALON) 100 MG capsule Take 1-2 capsules (100-200 mg total) by mouth every 8 (eight) hours. 08/21/17  Yes Phelps, Erin O, PA-C  budesonide-formoterol (SYMBICORT) 160-4.5 MCG/ACT inhaler Inhale 2 puffs into the lungs 2 (two) times daily. 03/18/16  Yes Funches, Josalyn, MD  Calcium Citrate 200 MG TABS Take 2 tablets (400 mg total) by mouth every other day. Patient taking differently: Take 400 mg by mouth at bedtime.  10/05/16  Yes  Newt Lukes, MD  cetirizine (ZYRTEC) 10 MG tablet Take 1 tablet (10 mg total) by mouth daily. 11/22/16  Yes Storm Frisk, MD  Cholecalciferol (VITAMIN D3) 5000 units CAPS Take 5,000 Units by mouth once a week.   Yes [provider]  diclofenac (VOLTAREN) 75 MG EC tablet Take 1 tablet (75 mg total) by mouth 2 (two) times daily as needed. Patient taking differently: Take 75 mg by mouth 2 (two) times daily as needed for mild pain.  05/10/17  Yes Kerrin Champagne, MD  diclofenac sodium (VOLTAREN) 1 % GEL Apply 2 g topically 4 (four) times daily. 05/04/17  Yes Kerrin Champagne, MD  DULoxetine (CYMBALTA) 30 MG capsule Take 1 capsule (30 mg total) by mouth 2 (two) times daily. 08/15/17  Yes Marcine Matar, MD  furosemide (LASIX) 20 MG tablet TAKE 1 TABLET (20 MG TOTAL) BY MOUTH DAILY AS NEEDED. Patient taking differently: Take 20 mg by mouth daily as needed for fluid.  07/14/17  Yes Marcine Matar, MD  gabapentin (NEURONTIN) 300 MG capsule 1 tab PO Qa.m and noon and 2 tabs Q p.m 05/18/17  Yes Marcine Matar, MD  hydrochlorothiazide (HYDRODIURIL) 12.5 MG tablet Take 1 tablet (12.5 mg total) by mouth daily. 06/22/17  Yes Marcine Matar, MD  ipratropium-albuterol (DUONEB) 0.5-2.5 (3) MG/3ML SOLN Take 3 mLs by nebulization every 6 (six) hours as needed. 07/28/17  Yes Mannam, Praveen, MD  montelukast (SINGULAIR) 10 MG tablet Take 1 tablet (10 mg total) by mouth daily. 08/09/17 08/09/18 Yes Mannam, Colbert Coyer, MD  Multiple Vitamins-Minerals (WOMENS MULTI) CAPS Take 1 capsule by mouth daily.   Yes [provider]  pantoprazole (PROTONIX) 40 MG tablet Take 40 mg by mouth daily.   Yes [provider]  promethazine (PHENERGAN) 25 MG tablet Take 1 tablet (25 mg total) by mouth every 8 (eight) hours as needed for nausea or vomiting. 11/17/16  Yes Funches, Josalyn, MD  sodium chloride (OCEAN) 0.65 % SOLN nasal spray Place 1 spray into both nostrils as needed for congestion. 08/21/17  Yes Phelps, Erin O, PA-C  tiZANidine (ZANAFLEX) 4 MG tablet TAKE 1 TABLET BY MOUTH EVERY 6 HOURS AS NEEDED FOR MUSCLE SPASMS. 06/10/17  Yes Marcine Matar, MD  fluticasone (FLONASE) 50 MCG/ACT nasal spray Place 2 sprays into both nostrils 2 (two) times daily. Patient not taking: Reported on 09/06/2017 07/06/16   Storm Frisk, MD  montelukast (SINGULAIR) 10 MG tablet Take 1 tablet (10 mg total) by mouth at bedtime. Patient not taking: Reported on 09/06/2017 08/14/17   Chilton Greathouse, MD  predniSONE (STERAPRED UNI-PAK 21 TAB) 10 MG (21) TBPK tablet Take by mouth daily. Take  6 tabs by mouth daily  for 2 days, then 5 tabs for 2 days, then 4 tabs for 2 days, then 3 tabs for 2 days, 2 tabs for 2 days, then 1 tab by mouth daily for 2 days 09/05/17   Emi Holes, PA-C  Respiratory Therapy Supplies (FLUTTER) DEVI As directed. 07/28/17   Chilton Greathouse, MD  Spacer/Aero-Holding Chambers (AEROCHAMBER MV) inhaler Use as instructed 01/25/17   Storm Frisk, MD  sulfamethoxazole-trimethoprim (BACTRIM DS,SEPTRA DS) 800-160 MG tablet Take 1 tablet by mouth 2 (two) times daily. 08/22/17   Joni Reining, PA-C    Family History Family History  Problem Relation Age of Onset  . Hypothyroidism Mother   . Diabetes Father   . Diabetes Paternal Uncle   . Hypertension Maternal Grandmother   .  Hypertension Maternal Grandfather   . Pancreatic cancer Maternal Grandfather   . Hypertension Paternal Grandmother   . Heart disease Paternal Grandfather   . Hypertension Paternal Grandfather     Social History Social History   Tobacco Use  . Smoking status: Never Smoker  . Smokeless tobacco: Never Used  Substance Use Topics  . Alcohol use: No  . Drug use: No     Allergies   Bee venom; Cinnamon; Doxycycline; Ibuprofen; Lexapro [escitalopram oxalate]; and Tape   Review of Systems Review of Systems  Constitutional: Negative for chills and fever.  HENT: Negative for facial swelling and sore throat.   Respiratory: Positive for shortness of breath.   Cardiovascular: Positive for chest pain.  Gastrointestinal: Negative for abdominal pain, nausea and vomiting.  Genitourinary: Negative for dysuria.  Musculoskeletal: Negative for back pain.  Skin: Negative for rash and wound.  Neurological: Positive for light-headedness. Negative for headaches.  Psychiatric/Behavioral: The patient is not nervous/anxious.      Physical Exam Updated Vital Signs BP (!) 119/57 (BP Location: Left Arm)   Pulse (!) 108   Temp 98.2 F (36.8 C) (Oral)   Resp 18   Ht 5\' 5"  (1.651 m)   Wt (!)  181.1 kg (399 lb 3.2 oz)   LMP 06/12/2017   SpO2 98%   BMI 66.43 kg/m   Physical Exam  Constitutional: She appears well-developed and well-nourished. No distress.  Morbid obesity  HENT:  Head: Normocephalic and atraumatic.  Mouth/Throat: Oropharynx is clear and moist. No oropharyngeal exudate.  Eyes: Conjunctivae are normal. Pupils are equal, round, and reactive to light. Right eye exhibits no discharge. Left eye exhibits no discharge. No scleral icterus.  Neck: Normal range of motion. Neck supple. No thyromegaly present.  Cardiovascular: Normal rate, regular rhythm, normal heart sounds and intact distal pulses. Exam reveals no gallop and no friction rub.  No murmur heard. Pulmonary/Chest: Effort normal and breath sounds normal. No stridor. No respiratory distress. She has no wheezes. She has no rales. She exhibits tenderness.    Abdominal: Soft. Bowel sounds are normal. She exhibits no distension. There is no tenderness. There is no rebound and no guarding.  Musculoskeletal: She exhibits no edema.  Lymphadenopathy:    She has no cervical adenopathy.  Neurological: She is alert. Coordination normal.  Skin: Skin is warm and dry. No rash noted. She is not diaphoretic. No pallor.  Psychiatric: She has a normal mood and affect.  Nursing note and vitals reviewed.    ED Treatments / Results  Labs (all labs ordered are listed, but only abnormal results are displayed) Labs Reviewed  BASIC METABOLIC PANEL - Abnormal; Notable for the following components:      Result Value   Chloride 97 (*)    Glucose, Bld 296 (*)    All other components within normal limits  CBC WITH DIFFERENTIAL/PLATELET - Abnormal; Notable for the following components:   Hemoglobin 11.5 (*)    MCH 25.1 (*)    MCHC 29.7 (*)    RDW 16.5 (*)    Neutro Abs 8.3 (*)    All other components within normal limits  D-DIMER, QUANTITATIVE (NOT AT [facility]) - Abnormal; Notable for the following components:   D-Dimer, Quant  >20.00 (*)    All other components within normal limits  HEMOGLOBIN A1C - Abnormal; Notable for the following components:   Hgb A1c MFr Bld 9.6 (*)    All other components within normal limits  HEPATIC FUNCTION PANEL - Abnormal; Notable  for the following components:   Total Protein 8.4 (*)    AST 61 (*)    ALT 86 (*)    All other components within normal limits  GLUCOSE, CAPILLARY - Abnormal; Notable for the following components:   Glucose-Capillary 227 (*)    All other components within normal limits  MRSA PCR SCREENING  RESPIRATORY PANEL BY PCR  CULTURE, EXPECTORATED SPUTUM-ASSESSMENT  GRAM STAIN  TROPONIN I  TSH  LIPID PANEL  RPR  HIV ANTIBODY (ROUTINE TESTING)  HCG, QUANTITATIVE, PREGNANCY  STREP PNEUMONIAE URINARY ANTIGEN    EKG  EKG Interpretation  Date/Time:  Tuesday September 05 2017 18:07:56 EST Ventricular Rate:  106 PR Interval:    QRS Duration: 88 QT Interval:  335 QTC Calculation: 445 R Axis:   54 Text Interpretation:  Sinus tachycardia no acute ST/T changes no significant change since May 2018 Confirmed by Pricilla Loveless [phone]) on 09/05/2017 6:47:31 PM Also confirmed by Pricilla Loveless [phone]), editor Elita Quick [phone])  on 09/06/2017 8:27:46 AM       Radiology Dg Chest 2 View  Result Date: 09/05/2017 CLINICAL DATA:  Shortness of Breath EXAM: CHEST  2 VIEW COMPARISON:  August 22, 2017 FINDINGS: There is no edema or consolidation. Heart size and pulmonary vascularity are normal. No adenopathy. No evident bone lesions. IMPRESSION: No edema or consolidation. Electronically Signed   By: Bretta Bang III M.D.   On: 09/05/2017 13:39   Ct Angio Chest Pe W/cm &/or Wo Cm  Result Date: 09/05/2017 CLINICAL DATA:  Three-month history of cough and shortness of breath. EXAM: CT ANGIOGRAPHY CHEST WITH CONTRAST TECHNIQUE: Multidetector CT imaging of the chest was performed using the standard protocol during bolus administration of intravenous contrast.  Multiplanar CT image reconstructions and MIPs were obtained to evaluate the vascular anatomy. CONTRAST:  ISOVUE-370 IOPAMIDOL (ISOVUE-370) INJECTION 76% COMPARISON:  Chest radiograph 09/05/2017, chest CT 08/10/2017 FINDINGS: Cardiovascular: Satisfactory opacification of the pulmonary arteries to the segmental level. No evidence of pulmonary embolism. Normal heart size. No pericardial effusion. Mild dilation of the main pulmonary trunk. Mediastinum/Nodes: No enlarged mediastinal, hilar, or axillary lymph nodes. Thyroid gland, trachea, and esophagus demonstrate no significant findings. Lungs/Pleura: Low lung volumes with mosaic attenuation of the lung parenchyma bilaterally. Upper Abdomen: Severe hepatic steatosis. Musculoskeletal: No chest wall abnormality. No acute or significant osseous findings. Review of the MIP images confirms the above findings. IMPRESSION: No evidence of pulmonary embolus. Mosaic attenuation with ground-glass opacities in the lung parenchyma. Such pattern may be due to presence of areas of hypoperfusion caused by pulmonary hypertension, small airway disease, infectious or inflammatory pneumonitis. Severe hepatic steatosis. Electronically Signed   By: Ted Mcalpine M.D.   On: 09/05/2017 21:03    Procedures Procedures (including critical care time)  Medications Ordered in ED Medications  azithromycin (ZITHROMAX) 500 MG injection (not administered)  ondansetron (ZOFRAN) 4 MG/2ML injection (not administered)  MEDLINE mouth rinse (15 mLs Mouth Rinse Not Given 09/06/17 0215)  predniSONE (DELTASONE) tablet 40 mg (40 mg Oral Given 09/06/17 0919)  enoxaparin (LOVENOX) injection 40 mg (40 mg Subcutaneous Not Given 09/06/17 0920)  cefTRIAXone (ROCEPHIN) 1 g in sodium chloride 0.9 % 100 mL IVPB (not administered)  azithromycin (ZITHROMAX) tablet 500 mg (not administered)  insulin aspart (novoLOG) injection 0-15 Units (5 Units Subcutaneous Given 09/06/17 0920)  LORazepam (ATIVAN)  tablet 1 mg (1 mg Oral Given 09/06/17 0737)  benzonatate (TESSALON) capsule 100-200 mg (100 mg Oral Given 09/06/17 0458)  mometasone-formoterol (DULERA) 200-5 MCG/ACT inhaler 2  puff (2 puffs Inhalation Given 09/06/17 0822)  diclofenac (VOLTAREN) EC tablet 75 mg (not administered)  gabapentin (NEURONTIN) capsule 600 mg (600 mg Oral Given 09/06/17 0457)  ipratropium-albuterol (DUONEB) 0.5-2.5 (3) MG/3ML nebulizer solution 3 mL (not administered)  montelukast (SINGULAIR) tablet 10 mg (10 mg Oral Given 09/06/17 0919)  pantoprazole (PROTONIX) EC tablet 40 mg (40 mg Oral Given 09/06/17 0919)  tiZANidine (ZANAFLEX) tablet 4 mg (4 mg Oral Given 09/06/17 0458)  ipratropium-albuterol (DUONEB) 0.5-2.5 (3) MG/3ML nebulizer solution 3 mL (3 mLs Nebulization Given 09/06/17 0822)  gabapentin (NEURONTIN) capsule 300 mg (300 mg Oral Given 09/06/17 0920)  furosemide (LASIX) injection 40 mg (not administered)  ketorolac (TORADOL) 15 MG/ML injection 7.5 mg (not administered)  ipratropium-albuterol (DUONEB) 0.5-2.5 (3) MG/3ML nebulizer solution 3 mL (3 mLs Nebulization Given 09/05/17 1430)  predniSONE (DELTASONE) tablet 60 mg (60 mg Oral Given 09/05/17 1422)  sodium chloride 0.9 % bolus 1,000 mL (0 mLs Intravenous Stopped 09/05/17 1921)  LORazepam (ATIVAN) tablet 1 mg (1 mg Oral Given 09/05/17 1711)  iopamidol (ISOVUE-370) 76 % injection 100 mL (100 mLs Intravenous Contrast Given 09/05/17 2022)  cefTRIAXone (ROCEPHIN) 1 g in sodium chloride 0.9 % 100 mL IVPB (0 g Intravenous Stopped 09/05/17 2350)  azithromycin (ZITHROMAX) 500 mg in sodium chloride 0.9 % 250 mL IVPB (500 mg Intravenous Transfusing/Transfer 09/06/17 0039)  sodium chloride 0.9 % bolus 1,000 mL (1,000 mLs Intravenous Restarted 09/05/17 2333)  albuterol (PROVENTIL) (2.5 MG/3ML) 0.083% nebulizer solution 2.5 mg (2.5 mg Nebulization Given 09/05/17 2151)  pantoprazole (PROTONIX) EC tablet 40 mg (40 mg Oral Given 09/05/17 2232)  HYDROcodone-acetaminophen (NORCO/VICODIN)  5-325 MG per tablet 1 tablet (1 tablet Oral Given 09/05/17 2234)  LORazepam (ATIVAN) tablet 1 mg (1 mg Oral Given 09/05/17 2234)  ondansetron (ZOFRAN) injection 4 mg (4 mg Intravenous Given 09/06/17 0035)     Initial Impression / Assessment and Plan / ED Course  I have reviewed the triage vital signs and the nursing notes.  Pertinent labs & imaging results that were available during my care of the patient were reviewed by me and considered in my medical decision making (see chart for details).  Clinical Course as of Sep 06 1108  Tue Sep 05, 2017  2155 Spoke with hospitalist from [facility] who will admit.   [EH]  2219 Informed that patient's IV infiltrated, she now has a large hematoma and is crying.  [EH]    Clinical Course User Index [EH] Cristina Gong, PA-C    Patient with probable asthma exacerbation, however considering heart rate is reaching over 150 bpm while ambulating and patient has severe lightheadedness, will check labs and chest x-ray.  Patient has history of severe needle phobia and having pseudoseizures.  Patient given Ativan prior.  Labs are pending.  Considering patient with severe needle phobia and labs have not been drawn since 2004, some nonemergent labs added on to panel which were requested by PCP if patient ever had labs drawn (per patient).  IV and labs drawn successfully placed without difficulty after Ativan.  At shift change, patient care transferred to Lyndel Safe, PA-C for continued evaluation, follow up of labs and determination of disposition. Anticipate discharge if labs negative.  Plan to discharge home with prednisone taper and follow-up to PCP if all negative.   Final Clinical Impressions(s) / ED Diagnoses   Final diagnoses:  Exacerbation of asthma, unspecified asthma severity, unspecified whether persistent  SOB (shortness of breath)    ED Discharge Orders  Ordered    predniSONE (STERAPRED UNI-PAK 21 TAB) 10 MG (21) TBPK tablet  Daily      09/05/17 1640         Emi Holes, PA-C 09/05/17 1808    Emi Holes, PA-C 09/06/17 1112    Pricilla Loveless, MD 09/09/17 1147

## 2017-09-05 NOTE — Discharge Instructions (Signed)
Begin taking Sterapred as prescribed until completed.  Please follow-up with your doctor for further evaluation and treatment of your symptoms.  Please return to the emergency department if you develop any new or worsening symptoms in severe chest pain or worsening shortness of breath.

## 2017-09-05 NOTE — ED Notes (Signed)
Pt began hyperventilating and had a syncopal episode during IV attempt. HR remained stable at 83 SpO2 dropped to 89%. RRT performed jaw thrust and sternal rub to maintain airway. Pt awoke and placed on 2L via N/C. Pt now A/Ox4.

## 2017-09-05 NOTE — ED Notes (Signed)
Pt ambulated on SpO2 monitoring. SpO2 maintained at 97% on R/A. Pt became moderately labored which pt states she normally get labored with walking it is slightly worse today though. HR increased to 150. Pt states she has some chronic pain in her knees which elevates her HR. Pt's HR returned to 118 within 60 seconds of rest.

## 2017-09-05 NOTE — ED Notes (Signed)
Pt's IV infiltrated during rocephin infusion and NaCl bolus. Infusions stopped and warm compress applied. Pt crying stated pain come on sudden and severe. 2+ radial pulse and distal CNS intact. Pt began panicking and hyperventilating. Pt began to feel faint and HR dropped to upper 30's briefly. Pt laid flat and encouraged pt on therapeutic breathing techniques. Pt's HR recovered and returned to 108. EDP notified. See new orders for pain medication.

## 2017-09-05 NOTE — ED Notes (Signed)
Pt refusing IV and bloodwork

## 2017-09-05 NOTE — ED Triage Notes (Signed)
Pt feels sob since yesterday.  Pt recently diagnosed with pneumonia in January.  Pt speaking complete sentences.  Productive cough.

## 2017-09-05 NOTE — ED Provider Notes (Signed)
Assumed care from Glenford Bayley PA-C at shift change briefly patient is here for evaluation of shortness of breath.  Please see note from [facility] law for full H&P.  Plan at this time is to follow-up on labs, and re-evaluate patient.  Patient continued to be tachycardic and borderline hypoxic while in the emergency room.  D-dimer came back elevated at over 20.  Labs with elevated sugar consistent with new diagnosis of diabetes.  Based on patient's severe needle phobia and reported pseudoseizures whenever she is stuck multiple extra labs were obtained, based on previous PCP notes, as she has refused labs for the past 10+ years.    CT PE performed with out obvious PE, there is ground glass opacities bilaterally consistent with atypical pneumonitis type pattern.  Patient remained tachycardic, Spaete fluid boluses.  She was started on IV azithromycin and Rocephin.    [facility] hospitalist was consulted for admission who agreed to admit patient.  Patient will be [facility].   Labs Reviewed  BASIC METABOLIC PANEL - Abnormal; Notable for the following components:      Result Value   Chloride 97 (*)    Glucose, Bld 296 (*)    All other components within normal limits  CBC WITH DIFFERENTIAL/PLATELET - Abnormal; Notable for the following components:   Hemoglobin 11.5 (*)    MCH 25.1 (*)    MCHC 29.7 (*)    RDW 16.5 (*)    Neutro Abs 8.3 (*)    All other components within normal limits  D-DIMER, QUANTITATIVE (NOT AT [facility]) - Abnormal; Notable for the following components:   D-Dimer, Quant >20.00 (*)    All other components within normal limits  HEMOGLOBIN A1C - Abnormal; Notable for the following components:   Hgb A1c MFr Bld 9.6 (*)    All other components within normal limits  HEPATIC FUNCTION PANEL - Abnormal; Notable for the following components:   Total Protein 8.4 (*)    AST 61 (*)    ALT 86 (*)    All other components within normal limits  TROPONIN I  TSH  LIPID PANEL  HCG,  QUANTITATIVE, PREGNANCY  RPR  HIV ANTIBODY (ROUTINE TESTING)   CLINICAL DATA: Three-month history of cough and shortness of  breath.    EXAM:  CT ANGIOGRAPHY CHEST WITH CONTRAST    TECHNIQUE:  Multidetector CT imaging of the chest was performed using the  standard protocol during bolus administration of intravenous  contrast. Multiplanar CT image reconstructions and MIPs were  obtained to evaluate the vascular anatomy.    CONTRAST: ISOVUE-370 IOPAMIDOL (ISOVUE-370) INJECTION 76%    COMPARISON: Chest radiograph 09/05/2017, chest CT 08/10/2017    FINDINGS:  Cardiovascular: Satisfactory opacification of the pulmonary arteries  to the segmental level. No evidence of pulmonary embolism. Normal  heart size. No pericardial effusion. Mild dilation of the main  pulmonary trunk.    Mediastinum/Nodes: No enlarged mediastinal, hilar, or axillary lymph  nodes. Thyroid gland, trachea, and esophagus demonstrate no  significant findings.    Lungs/Pleura: Low lung volumes with mosaic attenuation of the lung  parenchyma bilaterally.    Upper Abdomen: Severe hepatic steatosis.    Musculoskeletal: No chest wall abnormality. No acute or significant  osseous findings.    Review of the MIP images confirms the above findings.    IMPRESSION:  No evidence of pulmonary embolus.    Mosaic attenuation with ground-glass opacities in the lung  parenchyma. Such pattern may be due to presence of  areas of  hypoperfusion caused by pulmonary hypertension, small airway  disease, infectious or inflammatory pneumonitis.    Severe hepatic steatosis.      Electronically Signed  By: Ted Mcalpine M.D.  On: 09/05/2017 21:03      Cristina Gong, PA-C 09/06/17 0142    Pricilla Loveless, MD 09/09/17 [phone]

## 2017-09-05 NOTE — ED Notes (Signed)
Per RN Adam wait on EKG until he is done obtaining IV site.

## 2017-09-05 NOTE — ED Notes (Signed)
Call [facility] for consult  To [facility] per PA E. Jeraldine Loots.  Spoke with Clydie Braun @20 :35

## 2017-09-06 ENCOUNTER — Encounter (HOSPITAL_COMMUNITY): Payer: Self-pay

## 2017-09-06 DIAGNOSIS — J4541 Moderate persistent asthma with (acute) exacerbation: Secondary | ICD-10-CM | POA: Diagnosis present

## 2017-09-06 DIAGNOSIS — F419 Anxiety disorder, unspecified: Secondary | ICD-10-CM | POA: Diagnosis present

## 2017-09-06 DIAGNOSIS — I10 Essential (primary) hypertension: Secondary | ICD-10-CM | POA: Diagnosis present

## 2017-09-06 DIAGNOSIS — K760 Fatty (change of) liver, not elsewhere classified: Secondary | ICD-10-CM | POA: Diagnosis present

## 2017-09-06 DIAGNOSIS — Z8701 Personal history of pneumonia (recurrent): Secondary | ICD-10-CM | POA: Diagnosis not present

## 2017-09-06 DIAGNOSIS — R9389 Abnormal findings on diagnostic imaging of other specified body structures: Secondary | ICD-10-CM

## 2017-09-06 DIAGNOSIS — F329 Major depressive disorder, single episode, unspecified: Secondary | ICD-10-CM | POA: Diagnosis present

## 2017-09-06 DIAGNOSIS — Z6841 Body Mass Index (BMI) 40.0 and over, adult: Secondary | ICD-10-CM | POA: Diagnosis not present

## 2017-09-06 DIAGNOSIS — R55 Syncope and collapse: Secondary | ICD-10-CM | POA: Diagnosis present

## 2017-09-06 DIAGNOSIS — R0602 Shortness of breath: Secondary | ICD-10-CM | POA: Diagnosis present

## 2017-09-06 DIAGNOSIS — R945 Abnormal results of liver function studies: Secondary | ICD-10-CM

## 2017-09-06 DIAGNOSIS — J45901 Unspecified asthma with (acute) exacerbation: Secondary | ICD-10-CM

## 2017-09-06 DIAGNOSIS — E8770 Fluid overload, unspecified: Secondary | ICD-10-CM | POA: Diagnosis present

## 2017-09-06 DIAGNOSIS — Z8249 Family history of ischemic heart disease and other diseases of the circulatory system: Secondary | ICD-10-CM | POA: Diagnosis not present

## 2017-09-06 DIAGNOSIS — Z7951 Long term (current) use of inhaled steroids: Secondary | ICD-10-CM | POA: Diagnosis not present

## 2017-09-06 DIAGNOSIS — M1710 Unilateral primary osteoarthritis, unspecified knee: Secondary | ICD-10-CM | POA: Diagnosis present

## 2017-09-06 DIAGNOSIS — Z833 Family history of diabetes mellitus: Secondary | ICD-10-CM | POA: Diagnosis not present

## 2017-09-06 DIAGNOSIS — K219 Gastro-esophageal reflux disease without esophagitis: Secondary | ICD-10-CM | POA: Diagnosis present

## 2017-09-06 DIAGNOSIS — E119 Type 2 diabetes mellitus without complications: Secondary | ICD-10-CM

## 2017-09-06 DIAGNOSIS — E1165 Type 2 diabetes mellitus with hyperglycemia: Secondary | ICD-10-CM | POA: Diagnosis present

## 2017-09-06 DIAGNOSIS — I4892 Unspecified atrial flutter: Secondary | ICD-10-CM | POA: Diagnosis present

## 2017-09-06 DIAGNOSIS — Z800 Family history of malignant neoplasm of digestive organs: Secondary | ICD-10-CM | POA: Diagnosis not present

## 2017-09-06 DIAGNOSIS — R000 Tachycardia, unspecified: Secondary | ICD-10-CM | POA: Diagnosis present

## 2017-09-06 LAB — RESPIRATORY PANEL BY PCR
Adenovirus: NOT DETECTED
BORDETELLA PERTUSSIS-RVPCR: NOT DETECTED
CORONAVIRUS HKU1-RVPPCR: NOT DETECTED
CORONAVIRUS OC43-RVPPCR: NOT DETECTED
Chlamydophila pneumoniae: NOT DETECTED
Coronavirus 229E: NOT DETECTED
Coronavirus NL63: NOT DETECTED
Influenza A: NOT DETECTED
Influenza B: NOT DETECTED
METAPNEUMOVIRUS-RVPPCR: NOT DETECTED
Mycoplasma pneumoniae: NOT DETECTED
PARAINFLUENZA VIRUS 1-RVPPCR: NOT DETECTED
PARAINFLUENZA VIRUS 2-RVPPCR: NOT DETECTED
Parainfluenza Virus 3: NOT DETECTED
Parainfluenza Virus 4: NOT DETECTED
RHINOVIRUS / ENTEROVIRUS - RVPPCR: NOT DETECTED
Respiratory Syncytial Virus: NOT DETECTED

## 2017-09-06 LAB — GLUCOSE, CAPILLARY
Glucose-Capillary: 227 mg/dL — ABNORMAL HIGH (ref 65–99)
Glucose-Capillary: 287 mg/dL — ABNORMAL HIGH (ref 65–99)
Glucose-Capillary: 344 mg/dL — ABNORMAL HIGH (ref 65–99)
Glucose-Capillary: 356 mg/dL — ABNORMAL HIGH (ref 65–99)

## 2017-09-06 LAB — STREP PNEUMONIAE URINARY ANTIGEN: STREP PNEUMO URINARY ANTIGEN: NEGATIVE

## 2017-09-06 LAB — RPR: RPR Ser Ql: NONREACTIVE

## 2017-09-06 LAB — HIV ANTIBODY (ROUTINE TESTING W REFLEX): HIV Screen 4th Generation wRfx: NONREACTIVE

## 2017-09-06 LAB — MRSA PCR SCREENING: MRSA BY PCR: NEGATIVE

## 2017-09-06 MED ORDER — SODIUM CHLORIDE 0.9 % IV SOLN
1.0000 g | INTRAVENOUS | Status: DC
  Filled 2017-09-06: qty 10

## 2017-09-06 MED ORDER — INSULIN ASPART 100 UNIT/ML ~~LOC~~ SOLN
0.0000 [IU] | Freq: Three times a day (TID) | SUBCUTANEOUS | Status: DC
  Administered 2017-09-06: 8 [IU] via SUBCUTANEOUS
  Administered 2017-09-06: 5 [IU] via SUBCUTANEOUS
  Administered 2017-09-06: 11 [IU] via SUBCUTANEOUS
  Administered 2017-09-07: 3 [IU] via SUBCUTANEOUS
  Administered 2017-09-07: 11 [IU] via SUBCUTANEOUS
  Administered 2017-09-07: 8 [IU] via SUBCUTANEOUS
  Administered 2017-09-08: 5 [IU] via SUBCUTANEOUS
  Administered 2017-09-08: 11 [IU] via SUBCUTANEOUS

## 2017-09-06 MED ORDER — GABAPENTIN 300 MG PO CAPS
600.0000 mg | ORAL_CAPSULE | Freq: Every day | ORAL | Status: DC
  Administered 2017-09-06 – 2017-09-07 (×3): 600 mg via ORAL
  Filled 2017-09-06 (×4): qty 2

## 2017-09-06 MED ORDER — PANTOPRAZOLE SODIUM 40 MG PO TBEC
40.0000 mg | DELAYED_RELEASE_TABLET | Freq: Every day | ORAL | Status: DC
  Administered 2017-09-07: 40 mg via ORAL
  Filled 2017-09-06: qty 1

## 2017-09-06 MED ORDER — PREDNISONE 20 MG PO TABS
40.0000 mg | ORAL_TABLET | Freq: Every day | ORAL | Status: AC
  Administered 2017-09-06 – 2017-09-08 (×3): 40 mg via ORAL
  Filled 2017-09-06 (×3): qty 2

## 2017-09-06 MED ORDER — ENOXAPARIN SODIUM 40 MG/0.4ML ~~LOC~~ SOLN
40.0000 mg | Freq: Every day | SUBCUTANEOUS | Status: DC
  Administered 2017-09-07 – 2017-09-08 (×2): 40 mg via SUBCUTANEOUS
  Filled 2017-09-06 (×3): qty 0.4

## 2017-09-06 MED ORDER — MONTELUKAST SODIUM 10 MG PO TABS
10.0000 mg | ORAL_TABLET | Freq: Every day | ORAL | Status: DC
  Administered 2017-09-06 – 2017-09-08 (×3): 10 mg via ORAL
  Filled 2017-09-06 (×3): qty 1

## 2017-09-06 MED ORDER — KETOROLAC TROMETHAMINE 15 MG/ML IJ SOLN
7.5000 mg | Freq: Three times a day (TID) | INTRAMUSCULAR | Status: AC | PRN
  Administered 2017-09-06 – 2017-09-07 (×3): 7.5 mg via INTRAVENOUS
  Filled 2017-09-06 (×3): qty 1

## 2017-09-06 MED ORDER — BENZONATATE 100 MG PO CAPS
100.0000 mg | ORAL_CAPSULE | Freq: Three times a day (TID) | ORAL | Status: DC
  Administered 2017-09-06 (×3): 100 mg via ORAL
  Administered 2017-09-07: 200 mg via ORAL
  Administered 2017-09-07 (×2): 100 mg via ORAL
  Administered 2017-09-08: 200 mg via ORAL
  Filled 2017-09-06 (×2): qty 1
  Filled 2017-09-06: qty 2
  Filled 2017-09-06 (×3): qty 1
  Filled 2017-09-06: qty 2

## 2017-09-06 MED ORDER — GABAPENTIN 300 MG PO CAPS
300.0000 mg | ORAL_CAPSULE | Freq: Two times a day (BID) | ORAL | Status: DC
  Administered 2017-09-06 – 2017-09-08 (×5): 300 mg via ORAL
  Filled 2017-09-06 (×5): qty 1

## 2017-09-06 MED ORDER — DICLOFENAC SODIUM 75 MG PO TBEC
75.0000 mg | DELAYED_RELEASE_TABLET | Freq: Two times a day (BID) | ORAL | Status: DC | PRN
  Administered 2017-09-06: 75 mg via ORAL
  Filled 2017-09-06 (×2): qty 1

## 2017-09-06 MED ORDER — ORAL CARE MOUTH RINSE
15.0000 mL | Freq: Two times a day (BID) | OROMUCOSAL | Status: DC
  Administered 2017-09-07: 15 mL via OROMUCOSAL

## 2017-09-06 MED ORDER — DULOXETINE HCL 30 MG PO CPEP
30.0000 mg | ORAL_CAPSULE | Freq: Two times a day (BID) | ORAL | Status: DC
  Administered 2017-09-06 – 2017-09-08 (×4): 30 mg via ORAL
  Filled 2017-09-06 (×4): qty 1

## 2017-09-06 MED ORDER — LORAZEPAM 1 MG PO TABS
1.0000 mg | ORAL_TABLET | Freq: Four times a day (QID) | ORAL | Status: DC | PRN
  Administered 2017-09-06 – 2017-09-08 (×9): 1 mg via ORAL
  Filled 2017-09-06 (×9): qty 1

## 2017-09-06 MED ORDER — ALBUTEROL SULFATE HFA 108 (90 BASE) MCG/ACT IN AERS: 2.0000 | INHALATION_SPRAY | Freq: Four times a day (QID) | RESPIRATORY_TRACT | Status: DC | PRN

## 2017-09-06 MED ORDER — AZITHROMYCIN 250 MG PO TABS
500.0000 mg | ORAL_TABLET | ORAL | Status: DC
  Administered 2017-09-06 – 2017-09-07 (×2): 500 mg via ORAL
  Filled 2017-09-06 (×2): qty 2

## 2017-09-06 MED ORDER — IPRATROPIUM-ALBUTEROL 0.5-2.5 (3) MG/3ML IN SOLN: 3.0000 mL | Freq: Four times a day (QID) | RESPIRATORY_TRACT | Status: DC | PRN

## 2017-09-06 MED ORDER — ONDANSETRON HCL 4 MG/2ML IJ SOLN
4.0000 mg | Freq: Once | INTRAMUSCULAR | Status: AC
  Administered 2017-09-06: 4 mg via INTRAVENOUS

## 2017-09-06 MED ORDER — IPRATROPIUM-ALBUTEROL 0.5-2.5 (3) MG/3ML IN SOLN
3.0000 mL | Freq: Three times a day (TID) | RESPIRATORY_TRACT | Status: DC
  Administered 2017-09-06 – 2017-09-07 (×5): 3 mL via RESPIRATORY_TRACT
  Filled 2017-09-06 (×5): qty 3

## 2017-09-06 MED ORDER — HYDROCHLOROTHIAZIDE 25 MG PO TABS
12.5000 mg | ORAL_TABLET | Freq: Every day | ORAL | Status: DC
  Administered 2017-09-06: 12.5 mg via ORAL
  Filled 2017-09-06: qty 1

## 2017-09-06 MED ORDER — INSULIN GLARGINE 100 UNIT/ML ~~LOC~~ SOLN
10.0000 [IU] | Freq: Every day | SUBCUTANEOUS | Status: DC
  Administered 2017-09-06 – 2017-09-07 (×2): 10 [IU] via SUBCUTANEOUS
  Filled 2017-09-06 (×2): qty 0.1

## 2017-09-06 MED ORDER — PANTOPRAZOLE SODIUM 40 MG PO TBEC
40.0000 mg | DELAYED_RELEASE_TABLET | Freq: Every day | ORAL | Status: DC
  Administered 2017-09-06: 40 mg via ORAL
  Filled 2017-09-06: qty 1

## 2017-09-06 MED ORDER — ONDANSETRON HCL 4 MG/2ML IJ SOLN
INTRAMUSCULAR | Status: AC
  Filled 2017-09-06: qty 2

## 2017-09-06 MED ORDER — MOMETASONE FURO-FORMOTEROL FUM 200-5 MCG/ACT IN AERO
2.0000 | INHALATION_SPRAY | Freq: Two times a day (BID) | RESPIRATORY_TRACT | Status: DC
  Administered 2017-09-06 – 2017-09-08 (×5): 2 via RESPIRATORY_TRACT
  Filled 2017-09-06: qty 8.8

## 2017-09-06 MED ORDER — TIZANIDINE HCL 4 MG PO TABS
4.0000 mg | ORAL_TABLET | Freq: Four times a day (QID) | ORAL | Status: DC | PRN
  Administered 2017-09-06 – 2017-09-07 (×3): 4 mg via ORAL
  Filled 2017-09-06 (×3): qty 1

## 2017-09-06 MED ORDER — INSULIN ASPART 100 UNIT/ML ~~LOC~~ SOLN
3.0000 [IU] | Freq: Three times a day (TID) | SUBCUTANEOUS | Status: DC
  Administered 2017-09-07 – 2017-09-08 (×4): 3 [IU] via SUBCUTANEOUS

## 2017-09-06 MED ORDER — FUROSEMIDE 10 MG/ML IJ SOLN
40.0000 mg | Freq: Every day | INTRAMUSCULAR | Status: DC
  Administered 2017-09-06 – 2017-09-07 (×2): 40 mg via INTRAVENOUS
  Filled 2017-09-06 (×2): qty 4

## 2017-09-06 MED ORDER — CEPHALEXIN 500 MG PO CAPS
500.0000 mg | ORAL_CAPSULE | Freq: Two times a day (BID) | ORAL | Status: DC
  Administered 2017-09-06 – 2017-09-08 (×4): 500 mg via ORAL
  Filled 2017-09-06 (×4): qty 1

## 2017-09-06 NOTE — Progress Notes (Signed)
PROGRESS NOTE  Courtney Zamora UTM:[id number] DOB: 01-01-1986 DOA: 09/05/2017 PCP: Marcine Matar, MD  HPI/Recap of past 24 hours:  C/o leg pain, mild intermittent nonproductive cough, denies chest pain  Assessment/Plan: Principal Problem:   CAP (community acquired pneumonia) Active Problems:   Asthma, moderate persistent   Morbid (severe) obesity due to excess calories (HCC)   Hypertension   Anxiety   DM2 (diabetes mellitus, type 2) (HCC)   Bilateral groundglass capacity, short of breath, cough Respiratory viral panel negative, MRSa negative, sputum sample pending collection Currently treated as an asthma exacerbation Continue steroids nebs antibiotics She does appear volume overloaded, trial dose of Lasix Pulmonology consulted  paroxysmal  aflutter New diagnosis , keep on telemetry , TSH unremarkable , echo obtained a few weeks ago unremarkable Keep K above 4, mag > 2  cardiology consulted  Diabetes, new diagnosis She reports she has been on steroids for the last few months A1c9.6 Start insulin, will likely need to d/c on metformin and insulin  Elevated LFT Severe hepatic steatosis on imaging We will check hepatitis panel   Morbid obesity Body mass index is 66.43 kg/m.   Code Status: full  Family Communication: patient   Disposition Plan: not ready for discharge   Consultants:  pulmonology  cardiology  Procedures:  none  Antibiotics:  Rocephinx1, then keflex  zithromax   Objective: BP 121/90 (BP Location: Right Arm)   Pulse 90   Temp 98.1 F (36.7 C) (Oral)   Resp 20   Ht 5\' 5"  (1.651 m)   Wt (!) 181.1 kg (399 lb 3.2 oz)   LMP 06/12/2017   SpO2 98%   BMI 66.43 kg/m   Intake/Output Summary (Last 24 hours) at 09/06/2017 1618 Last data filed at 09/06/2017 1440 Gross per 24 hour  Intake 1562 ml  Output 1950 ml  Net -388 ml   Filed Weights   09/05/17 1314 09/06/17 0128  Weight: (!) 180.1 kg (397 lb) (!) 181.1 kg (399 lb 3.2  oz)    Exam: Patient is examined daily including today on 09/06/2017, exams remain the same as of yesterday except that has changed    General:  NAD, obese  Cardiovascular: RRR  Respiratory: CTABL  Abdomen: Soft/ND/NT, positive BS  Musculoskeletal: mild bilateral pitting  Edema, exam limited due to legs are sensitive to tough, legs looks swollen  Neuro: alert, oriented   Data Reviewed: Basic Metabolic Panel: Recent Labs  Lab 09/05/17 1740  NA 135  K 4.2  CL 97*  CO2 26  GLUCOSE 296*  BUN 16  CREATININE 0.73  CALCIUM 9.1   Liver Function Tests: Recent Labs  Lab 09/05/17 1800  AST 61*  ALT 86*  ALKPHOS 79  BILITOT 0.5  PROT 8.4*  ALBUMIN 3.5   No results for input(s): LIPASE, AMYLASE in the last 168 hours. No results for input(s): AMMONIA in the last 168 hours. CBC: Recent Labs  Lab 09/05/17 1740  WBC 9.9  NEUTROABS 8.3*  HGB 11.5*  HCT 38.7  MCV 84.3  PLT 340   Cardiac Enzymes:   Recent Labs  Lab 09/05/17 1740  TROPONINI <0.03   BNP (last 3 results) No results for input(s): BNP in the last 8760 hours.  ProBNP (last 3 results) No results for input(s): PROBNP in the last 8760 hours.  CBG: Recent Labs  Lab 09/06/17 0751 09/06/17 1407  GLUCAP 227* 287*    Recent Results (from the past 240 hour(s))  MRSA PCR Screening  Status: None   Collection Time: 09/06/17  1:40 AM  Result Value Ref Range Status   MRSA by PCR NEGATIVE NEGATIVE Final    Comment:        The GeneXpert MRSA Assay (FDA approved for NASAL specimens only), is one component of a comprehensive MRSA colonization surveillance program. It is not intended to diagnose MRSA infection nor to guide or monitor treatment for MRSA infections. Performed at [facility], [address], [place], [place] [address]   Respiratory Panel by PCR     Status: None   Collection Time: 09/06/17  1:40 AM  Result Value Ref Range Status   Adenovirus NOT DETECTED NOT  DETECTED Final   Coronavirus 229E NOT DETECTED NOT DETECTED Final   Coronavirus HKU1 NOT DETECTED NOT DETECTED Final   Coronavirus NL63 NOT DETECTED NOT DETECTED Final   Coronavirus OC43 NOT DETECTED NOT DETECTED Final   Metapneumovirus NOT DETECTED NOT DETECTED Final   Rhinovirus / Enterovirus NOT DETECTED NOT DETECTED Final   Influenza A NOT DETECTED NOT DETECTED Final   Influenza B NOT DETECTED NOT DETECTED Final   Parainfluenza Virus 1 NOT DETECTED NOT DETECTED Final   Parainfluenza Virus 2 NOT DETECTED NOT DETECTED Final   Parainfluenza Virus 3 NOT DETECTED NOT DETECTED Final   Parainfluenza Virus 4 NOT DETECTED NOT DETECTED Final   Respiratory Syncytial Virus NOT DETECTED NOT DETECTED Final   Bordetella pertussis NOT DETECTED NOT DETECTED Final   Chlamydophila pneumoniae NOT DETECTED NOT DETECTED Final   Mycoplasma pneumoniae NOT DETECTED NOT DETECTED Final    Comment: Performed at [facility], [address], [place], [place] [address]     Studies: Ct Angio Chest Pe W/cm &/or Wo Cm  Result Date: 09/05/2017 CLINICAL DATA:  Three-month history of cough and shortness of breath. EXAM: CT ANGIOGRAPHY CHEST WITH CONTRAST TECHNIQUE: Multidetector CT imaging of the chest was performed using the standard protocol during bolus administration of intravenous contrast. Multiplanar CT image reconstructions and MIPs were obtained to evaluate the vascular anatomy. CONTRAST:  ISOVUE-370 IOPAMIDOL (ISOVUE-370) INJECTION 76% COMPARISON:  Chest radiograph 09/05/2017, chest CT 08/10/2017 FINDINGS: Cardiovascular: Satisfactory opacification of the pulmonary arteries to the segmental level. No evidence of pulmonary embolism. Normal heart size. No pericardial effusion. Mild dilation of the main pulmonary trunk. Mediastinum/Nodes: No enlarged mediastinal, hilar, or axillary lymph nodes. Thyroid gland, trachea, and esophagus demonstrate no significant findings. Lungs/Pleura: Low lung volumes with  mosaic attenuation of the lung parenchyma bilaterally. Upper Abdomen: Severe hepatic steatosis. Musculoskeletal: No chest wall abnormality. No acute or significant osseous findings. Review of the MIP images confirms the above findings. IMPRESSION: No evidence of pulmonary embolus. Mosaic attenuation with ground-glass opacities in the lung parenchyma. Such pattern may be due to presence of areas of hypoperfusion caused by pulmonary hypertension, small airway disease, infectious or inflammatory pneumonitis. Severe hepatic steatosis. Electronically Signed   By: Ted Mcalpine M.D.   On: 09/05/2017 21:03    Scheduled Meds: . azithromycin  500 mg Oral Q24H  . benzonatate  100-200 mg Oral Q8H  . enoxaparin (LOVENOX) injection  40 mg Subcutaneous Daily  . furosemide  40 mg Intravenous Daily  . gabapentin  300 mg Oral BID WC  . gabapentin  600 mg Oral QHS  . insulin aspart  0-15 Units Subcutaneous TID WC  . ipratropium-albuterol  3 mL Nebulization TID  . mouth rinse  15 mL Mouth Rinse BID  . mometasone-formoterol  2 puff Inhalation BID  .  montelukast  10 mg Oral Daily  . pantoprazole  40 mg Oral Daily  . predniSONE  40 mg Oral Q breakfast    Continuous Infusions: . cefTRIAXone (ROCEPHIN)  IV       Time spent: 35  mins from 10:25 am to 11am I have personally reviewed and interpreted on  09/06/2017 daily labs, tele strips, imagings as discussed above under date review session and assessment and plans.  I reviewed all nursing notes, pharmacy notes, consultant notes,  vitals, pertinent old records  I have discussed plan of care as described above with RN , patient  on 09/06/2017   Albertine Grates MD, PhD  [facility] Pager [phone]. If 7PM-7AM, please contact night-coverage at [email or web address], password [facility] 09/06/2017, 4:18 PM  LOS: 0 days

## 2017-09-06 NOTE — Plan of Care (Signed)
  Clinical Measurements: Respiratory complications will improve 09/06/2017 2138 - Progressing by Herbert Pun, RN

## 2017-09-06 NOTE — Consult Note (Addendum)
Name: Courtney Zamora MRN: [id number] DOB: [birth year]    ADMISSION DATE:  09/05/2017 CONSULTATION DATE:  09/06/17  REFERRING MD :  Dr. [name] / [facility]   CHIEF COMPLAINT:  SOB   HISTORY OF PRESENT ILLNESS:  32 y/o F who presented to [facility] [place] [facility] on 2/19 with a 3 month history of shortness of breath, acutely worse 48 hours before presentation.   The patient has a history of possible restriction on 2018 PFT's, no obstruction (FVC 2.53 [67%], FEV1 2.28 [72%], F/F 90).  She has been worked up by Dr. Isaiah Serge for asthma & CT with very non-specific ground glass opacities.  Per chart review, he felt her symptoms were not typical of asthma - FENO low, no obstruction on PFT.  He felt her symptoms (specifically SOB) were likely related to obesity, lack of exercise and limited mobility with degenerative joint disease.   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  The patient previously refused labs (CBC with diff for eosinophils, HP panel, allergy profile, IgE) due to "seizures every time she has a lab draw".  The patient has multiple animals at home (snakes, lizards, hedgehogs, ferrets, dogs)  and it was felt this may be contributing to her symptoms. She had an ECHO in 07/2017 which had a normal LV function and normal right heart.    On admit, the patient reports she began having right sided chest pain and shortness of breath.  She was evaluated with a D-Dimer which was elevated (>20).  CTA of the chest was negative for PE but again demonstrated a mosaic attenuation with ground-glass opacities in the lung parenchyma and severe hepatic steatosis.    PCCM consulted for evaluation of dyspnea & abnormal CT chest.   PAST MEDICAL HISTORY :   has a past medical history of Anxiety, Asthma, Depression, Essential hypertension, GERD (gastroesophageal reflux disease), Obesity, Osteoarthritis, knee, Seizures (HCC), and Severe needle phobia.   has a past surgical history that includes  Tonsillectomy.  Prior to Admission medications   Medication Sig Start Date End Date Taking? Authorizing Provider  acetaminophen-codeine (TYLENOL #3) 300-30 MG tablet Take 1 tablet by mouth every 4 (four) hours as needed for moderate pain.   Yes [provider]  albuterol (PROVENTIL HFA;VENTOLIN HFA) 108 (90 Base) MCG/ACT inhaler Inhale 2 puffs into the lungs every 6 (six) hours as needed for wheezing or shortness of breath (wheezing). For shortness of breath. 04/06/17  Yes Marcine Matar, MD  albuterol (PROVENTIL) (2.5 MG/3ML) 0.083% nebulizer solution Take 3 mLs (2.5 mg total) by nebulization every 6 (six) hours as needed for wheezing or shortness of breath. 11/17/16  Yes Funches, Josalyn, MD  benzonatate (TESSALON) 100 MG capsule Take 1-2 capsules (100-200 mg total) by mouth every 8 (eight) hours. 08/21/17  Yes Phelps, Erin O, PA-C  budesonide-formoterol (SYMBICORT) 160-4.5 MCG/ACT inhaler Inhale 2 puffs into the lungs 2 (two) times daily. 03/18/16  Yes Funches, Josalyn, MD  Calcium Citrate 200 MG TABS Take 2 tablets (400 mg total) by mouth every other day. Patient taking differently: Take 400 mg by mouth at bedtime.  10/05/16  Yes Newt Lukes, MD  cetirizine (ZYRTEC) 10 MG tablet Take 1 tablet (10 mg total) by mouth daily. 11/22/16  Yes Storm Frisk, MD  Cholecalciferol (VITAMIN D3) 5000 units CAPS Take 5,000 Units by mouth once a week.   Yes [provider]  diclofenac (VOLTAREN) 75 MG EC tablet Take 1 tablet (75 mg total)  by mouth 2 (two) times daily as needed. Patient taking differently: Take 75 mg by mouth 2 (two) times daily as needed for mild pain.  05/10/17  Yes Kerrin Champagne, MD  diclofenac sodium (VOLTAREN) 1 % GEL Apply 2 g topically 4 (four) times daily. 05/04/17  Yes Kerrin Champagne, MD  DULoxetine (CYMBALTA) 30 MG capsule Take 1 capsule (30 mg total) by mouth 2 (two) times daily. 08/15/17  Yes Marcine Matar, MD  furosemide (LASIX) 20 MG tablet TAKE 1  TABLET (20 MG TOTAL) BY MOUTH DAILY AS NEEDED. Patient taking differently: Take 20 mg by mouth daily as needed for fluid.  07/14/17  Yes Marcine Matar, MD  gabapentin (NEURONTIN) 300 MG capsule 1 tab PO Qa.m and noon and 2 tabs Q p.m 05/18/17  Yes Marcine Matar, MD  hydrochlorothiazide (HYDRODIURIL) 12.5 MG tablet Take 1 tablet (12.5 mg total) by mouth daily. 06/22/17  Yes Marcine Matar, MD  ipratropium-albuterol (DUONEB) 0.5-2.5 (3) MG/3ML SOLN Take 3 mLs by nebulization every 6 (six) hours as needed. 07/28/17  Yes Mannam, Praveen, MD  montelukast (SINGULAIR) 10 MG tablet Take 1 tablet (10 mg total) by mouth daily. 08/09/17 08/09/18 Yes Mannam, Colbert Coyer, MD  Multiple Vitamins-Minerals (WOMENS MULTI) CAPS Take 1 capsule by mouth daily.   Yes [provider]  pantoprazole (PROTONIX) 40 MG tablet Take 40 mg by mouth daily.   Yes [provider]  promethazine (PHENERGAN) 25 MG tablet Take 1 tablet (25 mg total) by mouth every 8 (eight) hours as needed for nausea or vomiting. 11/17/16  Yes Funches, Josalyn, MD  sodium chloride (OCEAN) 0.65 % SOLN nasal spray Place 1 spray into both nostrils as needed for congestion. 08/21/17  Yes Phelps, Erin O, PA-C  tiZANidine (ZANAFLEX) 4 MG tablet TAKE 1 TABLET BY MOUTH EVERY 6 HOURS AS NEEDED FOR MUSCLE SPASMS. 06/10/17  Yes Marcine Matar, MD  fluticasone (FLONASE) 50 MCG/ACT nasal spray Place 2 sprays into both nostrils 2 (two) times daily. Patient not taking: Reported on 09/06/2017 07/06/16   Storm Frisk, MD  montelukast (SINGULAIR) 10 MG tablet Take 1 tablet (10 mg total) by mouth at bedtime. Patient not taking: Reported on 09/06/2017 08/14/17   Chilton Greathouse, MD  predniSONE (STERAPRED UNI-PAK 21 TAB) 10 MG (21) TBPK tablet Take by mouth daily. Take 6 tabs by mouth daily  for 2 days, then 5 tabs for 2 days, then 4 tabs for 2 days, then 3 tabs for 2 days, 2 tabs for 2 days, then 1 tab by mouth daily for 2 days 09/05/17   Emi Holes, PA-C  Respiratory Therapy Supplies (FLUTTER) DEVI As directed. 07/28/17   Chilton Greathouse, MD  Spacer/Aero-Holding Chambers (AEROCHAMBER MV) inhaler Use as instructed 01/25/17   Storm Frisk, MD  sulfamethoxazole-trimethoprim (BACTRIM DS,SEPTRA DS) 800-160 MG tablet Take 1 tablet by mouth 2 (two) times daily. 08/22/17   Joni Reining, PA-C    Allergies  Allergen Reactions  . Bee Venom Anaphylaxis  . Cinnamon Anaphylaxis  . Doxycycline Hives  . Ibuprofen Other (See Comments)    Abdominal pain  . Lexapro [Escitalopram Oxalate]     Generalized body shaking. ? sz  . Tape Hives and Rash    EKG STICKERS give pt rash, hives    FAMILY HISTORY:  family history includes Diabetes in her father and paternal uncle; Heart disease in her paternal grandfather; Hypertension in her maternal grandfather, maternal grandmother, paternal grandfather, and paternal grandmother; Hypothyroidism  in her mother; Pancreatic cancer in her maternal grandfather.  SOCIAL HISTORY:  reports that  has never smoked. she has never used smokeless tobacco. She reports that she does not drink alcohol or use drugs.  REVIEW OF SYSTEMS:  POSITIVES IN BOLD Constitutional: Negative for fever, chills, weight loss, malaise/fatigue and diaphoresis.  HENT: Negative for hearing loss, ear pain, nosebleeds, congestion, sore throat, neck pain, tinnitus and ear discharge.   Eyes: Negative for blurred vision, double vision, photophobia, pain, discharge and redness.  Respiratory: Negative for cough, hemoptysis, sputum production, shortness of breath, wheezing and stridor.   Cardiovascular: Negative for chest pain, palpitations, orthopnea, claudication, leg swelling and PND.  Gastrointestinal: Negative for heartburn, nausea, vomiting, abdominal pain, diarrhea, constipation, blood in stool and melena.  Genitourinary: Negative for dysuria, urgency, frequency, hematuria and flank pain.  Musculoskeletal: Negative for myalgias,  back pain, joint pain and falls.  Skin: Negative for itching and rash.  Neurological: Negative for dizziness, tingling, tremors, sensory change, speech change, focal weakness, seizures, loss of consciousness, weakness and headaches.  Endo/Heme/Allergies: Negative for environmental allergies and polydipsia. Does not bruise/bleed easily.   SUBJECTIVE:   VITAL SIGNS: Temp:  [98.2 F (36.8 C)-99.3 F (37.4 C)] 98.2 F (36.8 C) (02/20 0435) Pulse Rate:  [96-166] 108 (02/20 0435) Resp:  [18-37] 18 (02/20 0435) BP: (119-157)/(57-116) 119/57 (02/20 0435) SpO2:  [91 %-98 %] 98 % (02/20 0741) Weight:  [399 lb 3.2 oz (181.1 kg)] 399 lb 3.2 oz (181.1 kg) (02/20 0128)  PHYSICAL EXAMINATION: General: morbidly obese female in NAD lying in bed  HEENT: MM pink/moist, unable to assess for JVD due to body habitus  PSY: anxious Neuro: AAOx4 CV: s1s2 rrr, no m/r/g PULM: even/non-labored, lungs bilaterally distant but clear  DE:YCXK, non-tender, bsx4 active  Extremities: warm/dry, difficult to assess for edema due to body habitus  Skin: no rashes or lesions   Recent Labs  Lab 09/05/17 1740  NA 135  K 4.2  CL 97*  CO2 26  BUN 16  CREATININE 0.73  GLUCOSE 296*    Recent Labs  Lab 09/05/17 1740  HGB 11.5*  HCT 38.7  WBC 9.9  PLT 340    Dg Chest 2 View  Result Date: 09/05/2017 CLINICAL DATA:  Shortness of Breath EXAM: CHEST  2 VIEW COMPARISON:  August 22, 2017 FINDINGS: There is no edema or consolidation. Heart size and pulmonary vascularity are normal. No adenopathy. No evident bone lesions. IMPRESSION: No edema or consolidation. Electronically Signed   By: Bretta Bang III M.D.   On: 09/05/2017 13:39   Ct Angio Chest Pe W/cm &/or Wo Cm  Result Date: 09/05/2017 CLINICAL DATA:  Three-month history of cough and shortness of breath. EXAM: CT ANGIOGRAPHY CHEST WITH CONTRAST TECHNIQUE: Multidetector CT imaging of the chest was performed using the standard protocol during bolus  administration of intravenous contrast. Multiplanar CT image reconstructions and MIPs were obtained to evaluate the vascular anatomy. CONTRAST:  ISOVUE-370 IOPAMIDOL (ISOVUE-370) INJECTION 76% COMPARISON:  Chest radiograph 09/05/2017, chest CT 08/10/2017 FINDINGS: Cardiovascular: Satisfactory opacification of the pulmonary arteries to the segmental level. No evidence of pulmonary embolism. Normal heart size. No pericardial effusion. Mild dilation of the main pulmonary trunk. Mediastinum/Nodes: No enlarged mediastinal, hilar, or axillary lymph nodes. Thyroid gland, trachea, and esophagus demonstrate no significant findings. Lungs/Pleura: Low lung volumes with mosaic attenuation of the lung parenchyma bilaterally. Upper Abdomen: Severe hepatic steatosis. Musculoskeletal: No chest wall abnormality. No acute or significant osseous findings. Review of the MIP images  confirms the above findings. IMPRESSION: No evidence of pulmonary embolus. Mosaic attenuation with ground-glass opacities in the lung parenchyma. Such pattern may be due to presence of areas of hypoperfusion caused by pulmonary hypertension, small airway disease, infectious or inflammatory pneumonitis. Severe hepatic steatosis. Electronically Signed   By: Ted Mcalpine M.D.   On: 09/05/2017 21:03     SIGNIFICANT EVENTS  2/19  Admit with dyspnea, right sided chest pain   STUDIES CTA Chest 2/19 >> No evidence of pulmonary embolus.  Mosaic attenuation with ground-glass opacities in the lung parenchyma. Such pattern may be due to presence of areas of hypoperfusion caused by pulmonary hypertension, small airway disease, infectious or inflammatory pneumonitis.  Severe hepatic steatosis.  CULTURES   ANTIBIOTICS  Azithromycin 2/19 >>   ASSESSMENT / PLAN:  Discussion:  32 y/o F with super morbid obesity (BMI 66), baseline dyspnea with little supporting evidence for asthma.  She does have background ground-glass opacities with prior  concern for hypersensitivity pneumonitis.  This has been difficult to work up due to her severe needle fear & chronic intermittent prednisone use for DJD.  ECHO from 07/2017 is within normal limits / no right heart changes or evidence to suggest pulmonary HTN.  She will need further work up as an outpatient for HSP when she can be off steroids.  For the time being, it is reasonable to treat as an acute asthma exacerbation.    Dyspnea  Ground Glass Opacities on CT  Plan: Continue Dulera Duoneb TID  Prednisone taper to off over 5-7 days Follow up in pulmonary office with Dr. Isaiah Serge after discharge  Will need HRCT as outpatient  Consider PT consult for deconditioning   Severe Hepatic Steatosis  Elevated LFT's   Plan: Will need GI follow up as outpatient     Canary Brim, NP-C [facility] Pgr: [phone] or if no answer [phone] 09/06/2017, 1:53 PM

## 2017-09-06 NOTE — H&P (Signed)
History and Physical    Courtney Zamora KYH:[id number] DOB: [birth year] DOA: 09/05/2017  PCP: Marcine Matar, MD  Patient coming from: Home  I have personally briefly reviewed patient's old medical records in [facility]  Chief Complaint: SOB  HPI: Courtney Zamora is a 31 y.o. female with medical history significant of obesity, HTN, anxiety, asthma.  Patient presents to the ED at [facility] with c/o SOB.  Intermittent for past 3 months, worse over last couple of days.  Had been better in Jan with steroids that pulm put her on for possible asthma, but got worse in last couple of days.  Associated cough, CP worse with taking deep breath.   ED Course: D.Dimer positive, CTA neg for PE but did find ground glass appearance.  Patient put on rocephin, azithromycin, prednisone, and admitted.   Review of Systems: As per HPI otherwise 10 point review of systems negative.   Past Medical History:  Diagnosis Date  . Anxiety   . Asthma   . Depression   . Essential hypertension   . GERD (gastroesophageal reflux disease)   . Obesity   . Osteoarthritis, knee   . Seizures (HCC)   . Severe needle phobia     Past Surgical History:  Procedure Laterality Date  . TONSILLECTOMY       reports that  has never smoked. she has never used smokeless tobacco. She reports that she does not drink alcohol or use drugs.  Allergies  Allergen Reactions  . Bee Venom Anaphylaxis  . Cinnamon Anaphylaxis  . Doxycycline Hives  . Ibuprofen Other (See Comments)    Abdominal pain  . Lexapro [Escitalopram Oxalate]     Generalized body shaking. ? sz  . Tape Hives and Rash    EKG STICKERS give pt rash, hives    Family History  Problem Relation Age of Onset  . Hypothyroidism Mother   . Diabetes Father   . Diabetes Paternal Uncle   . Hypertension Maternal Grandmother   . Hypertension Maternal Grandfather   . Pancreatic cancer Maternal Grandfather   . Hypertension Paternal Grandmother   . Heart disease  Paternal Grandfather   . Hypertension Paternal Grandfather      Prior to Admission medications   Medication Sig Start Date End Date Taking? Authorizing Provider  benzonatate (TESSALON) 100 MG capsule Take 1-2 capsules (100-200 mg total) by mouth every 8 (eight) hours. 08/21/17  Yes Phelps, Erin O, PA-C  albuterol (PROVENTIL HFA;VENTOLIN HFA) 108 (90 Base) MCG/ACT inhaler Inhale 2 puffs into the lungs every 6 (six) hours as needed for wheezing or shortness of breath (wheezing). For shortness of breath. 04/06/17   Marcine Matar, MD  albuterol (PROVENTIL) (2.5 MG/3ML) 0.083% nebulizer solution Take 3 mLs (2.5 mg total) by nebulization every 6 (six) hours as needed for wheezing or shortness of breath. 11/17/16   Funches, Gerilyn Nestle, MD  budesonide-formoterol (SYMBICORT) 160-4.5 MCG/ACT inhaler Inhale 2 puffs into the lungs 2 (two) times daily. 03/18/16   Funches, Gerilyn Nestle, MD  Calcium Citrate 200 MG TABS Take 2 tablets (400 mg total) by mouth every other day. 10/05/16   Newt Lukes, MD  cetirizine (ZYRTEC) 10 MG tablet Take 1 tablet (10 mg total) by mouth daily. 11/22/16   Storm Frisk, MD  Cholecalciferol (VITAMIN D3) 5000 units CAPS Take 5,000 Units by mouth once a week.    [provider]  diclofenac (VOLTAREN) 75 MG EC tablet Take 1 tablet (75 mg total) by mouth 2 (two)  times daily as needed. 05/10/17   Kerrin Champagne, MD  diclofenac sodium (VOLTAREN) 1 % GEL Apply 2 g topically 4 (four) times daily. 05/04/17   Kerrin Champagne, MD  DULoxetine (CYMBALTA) 30 MG capsule Take 1 capsule (30 mg total) by mouth 2 (two) times daily. 08/15/17   Marcine Matar, MD  fluticasone (FLONASE) 50 MCG/ACT nasal spray Place 2 sprays into both nostrils 2 (two) times daily. 07/06/16   Storm Frisk, MD  furosemide (LASIX) 20 MG tablet TAKE 1 TABLET (20 MG TOTAL) BY MOUTH DAILY AS NEEDED. 07/14/17   Marcine Matar, MD  gabapentin (NEURONTIN) 300 MG capsule 1 tab PO Qa.m and noon and 2 tabs Q p.m  05/18/17   Marcine Matar, MD  hydrochlorothiazide (HYDRODIURIL) 12.5 MG tablet Take 1 tablet (12.5 mg total) by mouth daily. 06/22/17   Marcine Matar, MD  ipratropium-albuterol (DUONEB) 0.5-2.5 (3) MG/3ML SOLN Take 3 mLs by nebulization every 6 (six) hours as needed. 07/28/17   Mannam, Colbert Coyer, MD  montelukast (SINGULAIR) 10 MG tablet Take 1 tablet (10 mg total) by mouth daily. 08/09/17 08/09/18  Mannam, Colbert Coyer, MD  montelukast (SINGULAIR) 10 MG tablet Take 1 tablet (10 mg total) by mouth at bedtime. 08/14/17   Chilton Greathouse, MD  Multiple Vitamins-Minerals (WOMENS MULTI) CAPS Take 1 capsule by mouth daily.    [provider]  pantoprazole (PROTONIX) 40 MG tablet Take 40 mg by mouth daily.    [provider]  predniSONE (STERAPRED UNI-PAK 21 TAB) 10 MG (21) TBPK tablet Take by mouth daily. Take 6 tabs by mouth daily  for 2 days, then 5 tabs for 2 days, then 4 tabs for 2 days, then 3 tabs for 2 days, 2 tabs for 2 days, then 1 tab by mouth daily for 2 days 09/05/17   Emi Holes, PA-C  promethazine (PHENERGAN) 25 MG tablet Take 1 tablet (25 mg total) by mouth every 8 (eight) hours as needed for nausea or vomiting. 11/17/16   Dessa Phi, MD  Respiratory Therapy Supplies (FLUTTER) DEVI As directed. 07/28/17   Mannam, Colbert Coyer, MD  sodium chloride (OCEAN) 0.65 % SOLN nasal spray Place 1 spray into both nostrils as needed for congestion. 08/21/17   Lurene Shadow, PA-C  Spacer/Aero-Holding Chambers (AEROCHAMBER MV) inhaler Use as instructed 01/25/17   Storm Frisk, MD  sulfamethoxazole-trimethoprim (BACTRIM DS,SEPTRA DS) 800-160 MG tablet Take 1 tablet by mouth 2 (two) times daily. 08/22/17   Joni Reining, PA-C  tiZANidine (ZANAFLEX) 4 MG tablet TAKE 1 TABLET BY MOUTH EVERY 6 HOURS AS NEEDED FOR MUSCLE SPASMS. 06/10/17   Marcine Matar, MD    Physical Exam: Vitals:   09/06/17 0000 09/06/17 0030 09/06/17 0128 09/06/17 0150  BP: (!) 141/87 129/75  (!) 143/79  Pulse:  (!) 115 (!) 117  96  Resp: (!) 33 (!) 30  18  Temp:    99.3 F (37.4 C)  TempSrc:    Oral  SpO2: 95% 94%  97%  Weight:   (!) 181.1 kg (399 lb 3.2 oz)   Height:   5\' 5"  (1.651 m)     Constitutional: NAD, calm, comfortable Eyes: PERRL, lids and conjunctivae normal ENMT: Mucous membranes are moist. Posterior pharynx clear of any exudate or lesions.Normal dentition.  Neck: normal, supple, no masses, no thyromegaly Respiratory: clear to auscultation bilaterally, no wheezing, no crackles. Normal respiratory effort. No accessory muscle use.  Cardiovascular: Regular rate and rhythm, no murmurs / rubs /  gallops. No extremity edema. 2+ pedal pulses. No carotid bruits.  Abdomen: no tenderness, no masses palpated. No hepatosplenomegaly. Bowel sounds positive.  Musculoskeletal: no clubbing / cyanosis. No joint deformity upper and lower extremities. Good ROM, no contractures. Normal muscle tone.  Skin: no rashes, lesions, ulcers. No induration Neurologic: CN 2-12 grossly intact. Sensation intact, DTR normal. Strength 5/5 in all 4.  Psychiatric: Normal judgment and insight. Alert and oriented x 3. Normal mood.    Labs on Admission: I have personally reviewed following labs and imaging studies  CBC: Recent Labs  Lab 09/05/17 1740  WBC 9.9  NEUTROABS 8.3*  HGB 11.5*  HCT 38.7  MCV 84.3  PLT 340   Basic Metabolic Panel: Recent Labs  Lab 09/05/17 1740  NA 135  K 4.2  CL 97*  CO2 26  GLUCOSE 296*  BUN 16  CREATININE 0.73  CALCIUM 9.1   GFR: Estimated Creatinine Clearance: 169.9 mL/min (by C-G formula based on SCr of 0.73 mg/dL). Liver Function Tests: Recent Labs  Lab 09/05/17 1800  AST 61*  ALT 86*  ALKPHOS 79  BILITOT 0.5  PROT 8.4*  ALBUMIN 3.5   No results for input(s): LIPASE, AMYLASE in the last 168 hours. No results for input(s): AMMONIA in the last 168 hours. Coagulation Profile: No results for input(s): INR, PROTIME in the last 168 hours. Cardiac Enzymes: Recent  Labs  Lab 09/05/17 1740  TROPONINI <0.03   BNP (last 3 results) No results for input(s): PROBNP in the last 8760 hours. HbA1C: Recent Labs    09/05/17 1800  HGBA1C 9.6*   CBG: No results for input(s): GLUCAP in the last 168 hours. Lipid Profile: Recent Labs    09/05/17 1800  CHOL 164  HDL 58  LDLCALC 84  TRIG 109  CHOLHDL 2.8   Thyroid Function Tests: Recent Labs    09/05/17 1800  TSH 1.646   Anemia Panel: No results for input(s): VITAMINB12, FOLATE, FERRITIN, TIBC, IRON, RETICCTPCT in the last 72 hours. Urine analysis:    Component Value Date/Time   COLORURINE YELLOW 09/10/2014 1849   APPEARANCEUR CLOUDY (A) 09/10/2014 1849   LABSPEC 1.027 09/10/2014 1849   PHURINE 7.0 09/10/2014 1849   GLUCOSEU NEGATIVE 09/10/2014 1849   HGBUR SMALL (A) 09/10/2014 1849   BILIRUBINUR moderate (A) 07/24/2017 1125   BILIRUBINUR moderate 09/13/2016 1137   KETONESUR negative 07/24/2017 1125   KETONESUR NEGATIVE 09/10/2014 1849   PROTEINUR =30 (A) 07/24/2017 1125   PROTEINUR 100 09/13/2016 1137   PROTEINUR NEGATIVE 09/10/2014 1849   UROBILINOGEN 0.2 07/24/2017 1125   UROBILINOGEN 1.0 09/10/2014 1849   NITRITE Negative 07/24/2017 1125   NITRITE positive 09/13/2016 1137   NITRITE NEGATIVE 09/10/2014 1849   LEUKOCYTESUR Negative 07/24/2017 1125    Radiological Exams on Admission: Dg Chest 2 View  Result Date: 09/05/2017 CLINICAL DATA:  Shortness of Breath EXAM: CHEST  2 VIEW COMPARISON:  August 22, 2017 FINDINGS: There is no edema or consolidation. Heart size and pulmonary vascularity are normal. No adenopathy. No evident bone lesions. IMPRESSION: No edema or consolidation. Electronically Signed   By: Bretta Bang III M.D.   On: 09/05/2017 13:39   Ct Angio Chest Pe W/cm &/or Wo Cm  Result Date: 09/05/2017 CLINICAL DATA:  Three-month history of cough and shortness of breath. EXAM: CT ANGIOGRAPHY CHEST WITH CONTRAST TECHNIQUE: Multidetector CT imaging of the chest was  performed using the standard protocol during bolus administration of intravenous contrast. Multiplanar CT image reconstructions and MIPs were obtained to  evaluate the vascular anatomy. CONTRAST:  ISOVUE-370 IOPAMIDOL (ISOVUE-370) INJECTION 76% COMPARISON:  Chest radiograph 09/05/2017, chest CT 08/10/2017 FINDINGS: Cardiovascular: Satisfactory opacification of the pulmonary arteries to the segmental level. No evidence of pulmonary embolism. Normal heart size. No pericardial effusion. Mild dilation of the main pulmonary trunk. Mediastinum/Nodes: No enlarged mediastinal, hilar, or axillary lymph nodes. Thyroid gland, trachea, and esophagus demonstrate no significant findings. Lungs/Pleura: Low lung volumes with mosaic attenuation of the lung parenchyma bilaterally. Upper Abdomen: Severe hepatic steatosis. Musculoskeletal: No chest wall abnormality. No acute or significant osseous findings. Review of the MIP images confirms the above findings. IMPRESSION: No evidence of pulmonary embolus. Mosaic attenuation with ground-glass opacities in the lung parenchyma. Such pattern may be due to presence of areas of hypoperfusion caused by pulmonary hypertension, small airway disease, infectious or inflammatory pneumonitis. Severe hepatic steatosis. Electronically Signed   By: Ted Mcalpine M.D.   On: 09/05/2017 21:03    EKG: Independently reviewed.  Assessment/Plan Principal Problem:   CAP (community acquired pneumonia) Active Problems:   Asthma, moderate persistent   Morbid (severe) obesity due to excess calories (HCC)   Hypertension   Anxiety   DM2 (diabetes mellitus, type 2) (HCC)    1. CAP vs asthma / bronchitis - 1. PNA pathway 2. Rocephin / azithromycin 3. Prednisone 40mg  PO daily 4. HIV pending 5. Adult wheeze protocol 2. HTN - continue home BP meds 3. New onset DM2 - 1. Mod scale SSI AC 4. Obesity - needs sleep study (per pulm office note, strongly suspect OSA). 5. Anxiety - PRN  ativan before needle sticks  DVT prophylaxis: Lovenox Code Status: Full Family Communication: Family at bedside Disposition Plan: Home after admit Consults called: None Admission status: Place in [place], [place]. DO [facility] Pager [phone]  If 7AM-7PM, please contact day team taking care of patient [email or web address] Password [email or web address]  09/06/2017, 2:09 AM

## 2017-09-07 ENCOUNTER — Encounter: Payer: Self-pay | Admitting: Internal Medicine

## 2017-09-07 ENCOUNTER — Inpatient Hospital Stay (HOSPITAL_COMMUNITY): Payer: Medicaid Other

## 2017-09-07 ENCOUNTER — Telehealth: Payer: Self-pay | Admitting: Internal Medicine

## 2017-09-07 ENCOUNTER — Ambulatory Visit: Payer: Self-pay | Admitting: Internal Medicine

## 2017-09-07 DIAGNOSIS — J189 Pneumonia, unspecified organism: Secondary | ICD-10-CM

## 2017-09-07 DIAGNOSIS — I10 Essential (primary) hypertension: Secondary | ICD-10-CM

## 2017-09-07 DIAGNOSIS — I4892 Unspecified atrial flutter: Secondary | ICD-10-CM

## 2017-09-07 DIAGNOSIS — R0602 Shortness of breath: Secondary | ICD-10-CM

## 2017-09-07 LAB — GLUCOSE, CAPILLARY
GLUCOSE-CAPILLARY: 185 mg/dL — AB (ref 65–99)
GLUCOSE-CAPILLARY: 315 mg/dL — AB (ref 65–99)
GLUCOSE-CAPILLARY: 214 mg/dL — AB (ref 65–99)
Glucose-Capillary: 251 mg/dL — ABNORMAL HIGH (ref 65–99)
Glucose-Capillary: 264 mg/dL — ABNORMAL HIGH (ref 65–99)

## 2017-09-07 LAB — EXPECTORATED SPUTUM ASSESSMENT W GRAM STAIN, RFLX TO RESP C

## 2017-09-07 LAB — COMPREHENSIVE METABOLIC PANEL
ALT: 65 U/L — ABNORMAL HIGH (ref 14–54)
ANION GAP: 13 (ref 5–15)
AST: 37 U/L (ref 15–41)
Albumin: 3.1 g/dL — ABNORMAL LOW (ref 3.5–5.0)
Alkaline Phosphatase: 63 U/L (ref 38–126)
BUN: 20 mg/dL (ref 6–20)
CHLORIDE: 102 mmol/L (ref 101–111)
CO2: 27 mmol/L (ref 22–32)
Calcium: 8.8 mg/dL — ABNORMAL LOW (ref 8.9–10.3)
Creatinine, Ser: 0.85 mg/dL (ref 0.44–1.00)
Glucose, Bld: 210 mg/dL — ABNORMAL HIGH (ref 65–99)
POTASSIUM: 3.7 mmol/L (ref 3.5–5.1)
SODIUM: 142 mmol/L (ref 135–145)
Total Bilirubin: 0.3 mg/dL (ref 0.3–1.2)
Total Protein: 7.4 g/dL (ref 6.5–8.1)

## 2017-09-07 LAB — EXPECTORATED SPUTUM ASSESSMENT W REFEX TO RESP CULTURE

## 2017-09-07 LAB — MAGNESIUM: MAGNESIUM: 2.1 mg/dL (ref 1.7–2.4)

## 2017-09-07 MED ORDER — NITROGLYCERIN 0.4 MG SL SUBL: 0.4000 mg | SUBLINGUAL_TABLET | SUBLINGUAL | Status: DC | PRN

## 2017-09-07 MED ORDER — FLUTICASONE PROPIONATE 50 MCG/ACT NA SUSP
1.0000 | Freq: Every day | NASAL | Status: DC
  Administered 2017-09-08: 1 via NASAL
  Filled 2017-09-07: qty 16

## 2017-09-07 MED ORDER — MORPHINE SULFATE (PF) 4 MG/ML IV SOLN
2.0000 mg | Freq: Once | INTRAVENOUS | Status: AC
  Administered 2017-09-08: 2 mg via INTRAVENOUS
  Filled 2017-09-07: qty 1

## 2017-09-07 MED ORDER — IPRATROPIUM BROMIDE 0.02 % IN SOLN
0.5000 mg | Freq: Three times a day (TID) | RESPIRATORY_TRACT | Status: DC
  Administered 2017-09-08: 0.5 mg via RESPIRATORY_TRACT
  Filled 2017-09-07: qty 2.5

## 2017-09-07 MED ORDER — LEVALBUTEROL HCL 1.25 MG/0.5ML IN NEBU
1.2500 mg | INHALATION_SOLUTION | Freq: Three times a day (TID) | RESPIRATORY_TRACT | Status: DC
  Administered 2017-09-08: 1.25 mg via RESPIRATORY_TRACT
  Filled 2017-09-07: qty 0.5

## 2017-09-07 MED ORDER — GUAIFENESIN ER 600 MG PO TB12
600.0000 mg | ORAL_TABLET | Freq: Two times a day (BID) | ORAL | Status: DC
  Administered 2017-09-07 – 2017-09-08 (×3): 600 mg via ORAL
  Filled 2017-09-07 (×3): qty 1

## 2017-09-07 MED ORDER — CETIRIZINE HCL 10 MG PO TABS
10.0000 mg | ORAL_TABLET | Freq: Every day | ORAL | Status: DC
  Administered 2017-09-07: 10 mg via ORAL
  Filled 2017-09-07 (×2): qty 1

## 2017-09-07 MED ORDER — IPRATROPIUM BROMIDE 0.02 % IN SOLN
0.5000 mg | Freq: Three times a day (TID) | RESPIRATORY_TRACT | Status: DC
  Administered 2017-09-07: 0.5 mg via RESPIRATORY_TRACT
  Filled 2017-09-07: qty 2.5

## 2017-09-07 MED ORDER — LEVALBUTEROL HCL 1.25 MG/0.5ML IN NEBU
1.2500 mg | INHALATION_SOLUTION | Freq: Three times a day (TID) | RESPIRATORY_TRACT | Status: DC
  Administered 2017-09-07: 1.25 mg via RESPIRATORY_TRACT
  Filled 2017-09-07: qty 0.5

## 2017-09-07 MED ORDER — PREDNISONE 20 MG PO TABS: 20.0000 mg | ORAL_TABLET | Freq: Every day | ORAL | Status: DC

## 2017-09-07 MED ORDER — PREDNISONE 5 MG PO TABS: 10.0000 mg | ORAL_TABLET | Freq: Every day | ORAL | Status: DC

## 2017-09-07 MED ORDER — SENNOSIDES-DOCUSATE SODIUM 8.6-50 MG PO TABS
1.0000 | ORAL_TABLET | Freq: Two times a day (BID) | ORAL | Status: DC
  Administered 2017-09-07 – 2017-09-08 (×2): 1 via ORAL
  Filled 2017-09-07 (×2): qty 1

## 2017-09-07 MED ORDER — ONDANSETRON HCL 4 MG/2ML IJ SOLN
4.0000 mg | Freq: Four times a day (QID) | INTRAMUSCULAR | Status: DC | PRN
  Administered 2017-09-07: 4 mg via INTRAVENOUS
  Filled 2017-09-07: qty 2

## 2017-09-07 MED ORDER — PREDNISONE 5 MG PO TABS: 30.0000 mg | ORAL_TABLET | Freq: Every day | ORAL | Status: DC

## 2017-09-07 NOTE — Care Management Note (Signed)
Case Management Note  Patient Details  Name: Courtney Zamora MRN: [id number] Date of Birth: Oct 17, 1985  Subjective/Objective:Spoke to [facility] pharmacy-Zamora they already have patient in the system-they have novolog, & lantus per DM ed recc. Patient follows @ [facility] for pcp, & pharmacy. Confirmed w/admit-No health insurance.  PT-no f/u.                  Action/Plan:d/c plan home.   Expected Discharge Date:                  Expected Discharge Plan:  Home/Self Care  In-House Referral:     Discharge planning Services  CM Consult, Indigent Health Clinic  Post Acute Care Choice:    Choice offered to:     DME Arranged:    DME Agency:     HH Arranged:    HH Agency:     Status of Service:  In process, will continue to follow  If discussed at Long Length of Stay Meetings, dates discussed:    Additional Comments:  Lanier Clam, RN 09/07/2017, 3:35 PM

## 2017-09-07 NOTE — Telephone Encounter (Signed)
no

## 2017-09-07 NOTE — Progress Notes (Signed)
At 8am administration on lovenox and insulin pt tolerated well without premedicating with ativan. Before lunch fingerstick, pt anxious and asking for ativan before finger stick.

## 2017-09-07 NOTE — Progress Notes (Signed)
Inpatient Diabetes Program Recommendations  AACE/ADA: New Consensus Statement on Inpatient Glycemic Control (2015)  Target Ranges:  Prepandial:   less than 140 mg/dL      Peak postprandial:   less than 180 mg/dL (1-2 hours)      Critically ill patients:  140 - 180 mg/dL   Lab Results  Component Value Date   GLUCAP 185 (H) 09/07/2017   HGBA1C 9.6 (H) 09/05/2017    Review of Glycemic Control  Diabetes history: Newly-diagnosed (last 2 mo) Outpatient Diabetes medications:  Current orders for Inpatient glycemic control: Lantus 10 units QHS, Novolog 0-15 units tidwc + 3 units tidwc  On Prednisone 40 mg QD New onset DM with HgbA1C of 9.6% Will need to go home on insulin. Spoke with pt at length regarding importance of controlling blood sugars. Discussed HgbA1C of 9.6% and goal of 7%. Focused on controlling blood sugars with lifestyle modifications such as diet, exercise and stress management. Pt willing to go home on insulin. Will be able to get medications at [facility] at discharge.   Inpatient Diabetes Program Recommendations:      Increase Novolog to 6 units tidwc  Increase Lantus to 12 units QHS OP Diabetes Education consult Living Well book RN - please let pt do fingersticks and give her own insulin. Will teach insulin pen administration in am.  Discussed above with MD.  Thank you. Ailene Ards, RD, LDN, CDE Inpatient Diabetes Coordinator [phone]

## 2017-09-07 NOTE — Progress Notes (Signed)
PROGRESS NOTE  Courtney Zamora RCV:[id number] DOB: 10/21/1985 DOA: 09/05/2017 PCP: Marcine Matar, MD  HPI/Recap of past 24 hours:  Sinus tachycardia with activity C/o leg pain,  mild intermittent nonproductive cough, denies chest pain No fever  Assessment/Plan: Principal Problem:   CAP (community acquired pneumonia) Active Problems:   Asthma, moderate persistent   Morbid (severe) obesity due to excess calories (HCC)   Hypertension   Anxiety   DM2 (diabetes mellitus, type 2) (HCC)   SOB (shortness of breath)   Bilateral groundglass capacity, short of breath, cough -Respiratory viral panel negative, MRSa negative, sputum culture in process -Currently treated as an asthma exacerbation -Continue steroids nebs antibiotics -She does appear volume overloaded, good urine output on Lasix, will continue 1 more day. -Improving she ambulated on room air O2 sat remain above 90%, she has sinus tachycardia, will change nebs to Xopenex -Pulmonology consulted, will need outpatient follow-up  There is a concern of paroxysmal  Aflutter on tele , per cardiology is artifact  TSH unremarkable , echo obtained a few weeks ago unremarkable Keep K above 4, mag > 2  cardiology input appreciated  Diabetes, new diagnosis -She reports she has been on steroids for the last few months -A1c9.6 -Start insulin, will likely need to d/c on metformin and insulin.  She requested Ativan with injection. She is observed did not need Ativan for Lovenox injection in the hospital.  I do not plan to discharge her on Ativan -Encouraged weight loss   Elevated LFT Severe hepatic steatosis on imaging, [place] GI follow-up hepatitis panel in process  Incidental finding on CT scan from 1/24 "Multiple nodular appearing soft tissue densities in the distal third of the esophagus, concerning for multiple small polyps. Further evaluation with nonemergent endoscopy should be considered in the near future to better  evaluate these findings." She is scheduled to see GI today, canceled her appointment. I have discussed case with GI who recommend reschedule outpatient follow-up.  Morbid obesity Body mass index is 66.43 kg/m. She is followed with pulmonology who plan to get her outpatient sleep study  Code Status: full  Family Communication: patient   Disposition Plan: DC home tomorrow, need outpatient primary care doctor follow-up, pulmonology, outpatient sleep study ,outpatient weight loss ,outpatient GI follow-up   Consultants:  pulmonology  Cardiology  Phone conversation with GI on 2/21  Procedures:  none  Antibiotics:  Rocephinx1, then keflex  zithromax   Objective: BP (!) 145/75 (BP Location: Right Arm)   Pulse 100   Temp 98.7 F (37.1 C) (Oral)   Resp 20   Ht 5\' 5"  (1.651 m)   Wt (!) 181.1 kg (399 lb 3.2 oz)   LMP 06/12/2017   SpO2 97%   BMI 66.43 kg/m   Intake/Output Summary (Last 24 hours) at 09/07/2017 1054 Last data filed at 09/06/2017 1840 Gross per 24 hour  Intake 582 ml  Output 3450 ml  Net -2868 ml   Filed Weights   09/05/17 1314 09/06/17 0128  Weight: (!) 180.1 kg (397 lb) (!) 181.1 kg (399 lb 3.2 oz)    Exam: Patient is examined daily including today on 09/07/2017, exams remain the same as of yesterday except that has changed    General:  NAD, obese  Cardiovascular: RRR  Respiratory: CTABL  Abdomen: Soft/ND/NT, positive BS  Musculoskeletal: mild bilateral pitting  Edema, exam limited due to legs are less sensitive to tough today, legs looks less tight, less swollen  Neuro: alert, oriented   Data  Reviewed: Basic Metabolic Panel: Recent Labs  Lab 09/05/17 1740 09/07/17 0613  NA 135 142  K 4.2 3.7  CL 97* 102  CO2 26 27  GLUCOSE 296* 210*  BUN 16 20  CREATININE 0.73 0.85  CALCIUM 9.1 8.8*  MG  --  2.1   Liver Function Tests: Recent Labs  Lab 09/05/17 1800 09/07/17 0613  AST 61* 37  ALT 86* 65*  ALKPHOS 79 63  BILITOT 0.5  0.3  PROT 8.4* 7.4  ALBUMIN 3.5 3.1*   No results for input(s): LIPASE, AMYLASE in the last 168 hours. No results for input(s): AMMONIA in the last 168 hours. CBC: Recent Labs  Lab 09/05/17 1740  WBC 9.9  NEUTROABS 8.3*  HGB 11.5*  HCT 38.7  MCV 84.3  PLT 340   Cardiac Enzymes:   Recent Labs  Lab 09/05/17 1740  TROPONINI <0.03   BNP (last 3 results) No results for input(s): BNP in the last 8760 hours.  ProBNP (last 3 results) No results for input(s): PROBNP in the last 8760 hours.  CBG: Recent Labs  Lab 09/06/17 0751 09/06/17 1407 09/06/17 1735 09/06/17 2154 09/07/17 0755  GLUCAP 227* 287* 344* 356* 185*    Recent Results (from the past 240 hour(s))  MRSA PCR Screening     Status: None   Collection Time: 09/06/17  1:40 AM  Result Value Ref Range Status   MRSA by PCR NEGATIVE NEGATIVE Final    Comment:        The GeneXpert MRSA Assay (FDA approved for NASAL specimens only), is one component of a comprehensive MRSA colonization surveillance program. It is not intended to diagnose MRSA infection nor to guide or monitor treatment for MRSA infections. Performed at [facility], [address], [place], [place] [address]   Respiratory Panel by PCR     Status: None   Collection Time: 09/06/17  1:40 AM  Result Value Ref Range Status   Adenovirus NOT DETECTED NOT DETECTED Final   Coronavirus 229E NOT DETECTED NOT DETECTED Final   Coronavirus HKU1 NOT DETECTED NOT DETECTED Final   Coronavirus NL63 NOT DETECTED NOT DETECTED Final   Coronavirus OC43 NOT DETECTED NOT DETECTED Final   Metapneumovirus NOT DETECTED NOT DETECTED Final   Rhinovirus / Enterovirus NOT DETECTED NOT DETECTED Final   Influenza A NOT DETECTED NOT DETECTED Final   Influenza B NOT DETECTED NOT DETECTED Final   Parainfluenza Virus 1 NOT DETECTED NOT DETECTED Final   Parainfluenza Virus 2 NOT DETECTED NOT DETECTED Final   Parainfluenza Virus 3 NOT DETECTED NOT DETECTED  Final   Parainfluenza Virus 4 NOT DETECTED NOT DETECTED Final   Respiratory Syncytial Virus NOT DETECTED NOT DETECTED Final   Bordetella pertussis NOT DETECTED NOT DETECTED Final   Chlamydophila pneumoniae NOT DETECTED NOT DETECTED Final   Mycoplasma pneumoniae NOT DETECTED NOT DETECTED Final    Comment: Performed at [facility], [address] [place], [place] [address]     Studies: No results found.  Scheduled Meds: . azithromycin  500 mg Oral Q24H  . benzonatate  100-200 mg Oral Q8H  . cephALEXin  500 mg Oral Q12H  . cetirizine  10 mg Oral Daily  . DULoxetine  30 mg Oral BID  . enoxaparin (LOVENOX) injection  40 mg Subcutaneous Daily  . fluticasone  1 spray Each Nare Daily  . furosemide  40 mg Intravenous Daily  . gabapentin  300 mg Oral BID WC  . gabapentin  600  mg Oral QHS  . guaiFENesin  600 mg Oral BID  . insulin aspart  0-15 Units Subcutaneous TID WC  . insulin aspart  3 Units Subcutaneous TID WC  . insulin glargine  10 Units Subcutaneous QHS  . ipratropium-albuterol  3 mL Nebulization TID  . mouth rinse  15 mL Mouth Rinse BID  . mometasone-formoterol  2 puff Inhalation BID  . montelukast  10 mg Oral Daily  . pantoprazole  40 mg Oral QHS  . predniSONE  40 mg Oral Q breakfast    Continuous Infusions:    Time spent: 35  mins , case discussed with cardiology and GI. I have personally reviewed and interpreted on  09/07/2017 daily labs, tele strips, imagings as discussed above under date review session and assessment and plans.  I reviewed all nursing notes, pharmacy notes, consultant notes,  vitals, pertinent old records  I have discussed plan of care as described above with RN , patient  on 09/07/2017   Albertine Grates MD, PhD  [facility] Pager [phone]. If 7PM-7AM, please contact night-coverage at [email or web address], password [email or web address] 09/07/2017, 10:54 AM  LOS: 1 day

## 2017-09-07 NOTE — Progress Notes (Signed)
Report receive from RN. Agreed with RN assessment of patient and will cont to monitor.

## 2017-09-07 NOTE — Plan of Care (Signed)
Will cont to mon 

## 2017-09-07 NOTE — Telephone Encounter (Signed)
Do you want to charge? 

## 2017-09-07 NOTE — Care Management Note (Signed)
Case Management Note  Patient Details  Name: MADALEE ALTMANN MRN: [id number] Date of Birth: [birth year]  Subjective/Objective: patient states she does not have medicaid,has family planning medicaid.Referral to financial counselor-medicaid process;will contact admit to confirm health insurance.                    Action/Plan:d/c plan home.   Expected Discharge Date:                  Expected Discharge Plan:  Home/Self Care  In-House Referral:     Discharge planning Services  CM Consult, Indigent Health Clinic  Post Acute Care Choice:    Choice offered to:     DME Arranged:    DME Agency:     HH Arranged:    HH Agency:     Status of Service:  In process, will continue to follow  If discussed at Long Length of Stay Meetings, dates discussed:    Additional Comments:  Lanier Clam, RN 09/07/2017, 10:45 AM
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
## 2017-09-07 NOTE — Consult Note (Addendum)
Cardiology Consultation:   Patient ID: Courtney Zamora; [id number]; Aug 14, 1985   Admit date: 09/05/2017 Date of Consult: 09/07/2017  Primary Care Provider: Marcine Matar, MD Primary Cardiologist: New to [facility], Dr. Eden Emms  Primary Electrophysiologist:  None   Patient Profile:   Courtney Zamora is a 32 y.o. female with PMH of HTN, asthma, depression, anxiety, and morbid obesity who is being seen today for the evaluation of possible atrial flutter on telemetry at the request of Dr. [name].  History of Present Illness:   Ms. Courtney Zamora is a 32 y.o. female with PMH of HTN, asthma, depression, anxiety, and morbid obesity, who presented to [facility] ED 09/05/17 with complaints of SOB. Patient has had numerous illnesses since Thanksgiving 2018, including multiple rounds of antibiotics for sinus infections and pneumonia. She states she has had chest discomfort/ tightness since being diagnosed with PNA 07/2017 associated with SOB. Over the past few days, she has noticed increasing SOB, particularly with exertion prompting her to present to the ED.   At this time, she has not noticed any improvement in her symptoms. She has noticed palpitations for the past several months, more common with activity. She states her heart rate has been elevated at her last several outpatient appointments which she contributed to deconditioning and increased albuterol use. She denies crushing chest pain, orthopnea, PND. She does report intermittent LE edema for which she takes prn lasix, however has not been taking it recently as she has difficulty getting to the bathroom unassisted. On ROS, patient notes syncope after showering which has been going on for several months. She states she will shower, becomes lightheaded, goes straight to her bed afterwards to lay down where she proceeds to lose consciousness for 1-5 minutes. She denies any incontinence, tongue biting, or shaking during these episodes.   Hospital course:  afebrile, persistently tachycardic, intermittently hypertensive, with occasional supplemental O2 via [place]. Admission labs notable for elevated glucose, elevated AST/ALT, troponin negative x1, Ddimer elevated, LDL 84, Hgb 11.5, TSH wnl, HgbA1C 9.6, HIV neg. Labs today with normal electrolytes, Cr 0.85. CXR negative for pulm vascular congestion. CT chest neg for PE, however with ground-glass opacities c/f pulmHTN, small airway disease, infection, or inflammatory pneumonitis; also severe hepatic steatosis noted. Patient was started on IV abx and admitted to medicine. Cardiology consulted for possible paroxysmal atrial flutter on telemetry.  Past Medical History:  Diagnosis Date  . Anxiety   . Asthma   . Depression   . Essential hypertension   . GERD (gastroesophageal reflux disease)   . Obesity   . Osteoarthritis, knee   . Seizures (HCC)   . Severe needle phobia     Past Surgical History:  Procedure Laterality Date  . TONSILLECTOMY       Home Medications:  Prior to Admission medications   Medication Sig Start Date End Date Taking? Authorizing Provider  acetaminophen-codeine (TYLENOL #3) 300-30 MG tablet Take 1 tablet by mouth every 4 (four) hours as needed for moderate pain.   Yes [provider]  albuterol (PROVENTIL HFA;VENTOLIN HFA) 108 (90 Base) MCG/ACT inhaler Inhale 2 puffs into the lungs every 6 (six) hours as needed for wheezing or shortness of breath (wheezing). For shortness of breath. 04/06/17  Yes Marcine Matar, MD  albuterol (PROVENTIL) (2.5 MG/3ML) 0.083% nebulizer solution Take 3 mLs (2.5 mg total) by nebulization every 6 (six) hours as needed for wheezing or shortness of breath. 11/17/16  Yes Funches, Josalyn, MD  benzonatate (TESSALON) 100 MG capsule  Take 1-2 capsules (100-200 mg total) by mouth every 8 (eight) hours. 08/21/17  Yes Phelps, Erin O, PA-C  budesonide-formoterol (SYMBICORT) 160-4.5 MCG/ACT inhaler Inhale 2 puffs into the lungs 2 (two) times daily. 03/18/16   Yes Funches, Josalyn, MD  Calcium Citrate 200 MG TABS Take 2 tablets (400 mg total) by mouth every other day. Patient taking differently: Take 400 mg by mouth at bedtime.  10/05/16  Yes Newt Lukes, MD  cetirizine (ZYRTEC) 10 MG tablet Take 1 tablet (10 mg total) by mouth daily. 11/22/16  Yes Storm Frisk, MD  Cholecalciferol (VITAMIN D3) 5000 units CAPS Take 5,000 Units by mouth once a week.   Yes [provider]  diclofenac (VOLTAREN) 75 MG EC tablet Take 1 tablet (75 mg total) by mouth 2 (two) times daily as needed. Patient taking differently: Take 75 mg by mouth 2 (two) times daily as needed for mild pain.  05/10/17  Yes Kerrin Champagne, MD  diclofenac sodium (VOLTAREN) 1 % GEL Apply 2 g topically 4 (four) times daily. 05/04/17  Yes Kerrin Champagne, MD  DULoxetine (CYMBALTA) 30 MG capsule Take 1 capsule (30 mg total) by mouth 2 (two) times daily. 08/15/17  Yes Marcine Matar, MD  furosemide (LASIX) 20 MG tablet TAKE 1 TABLET (20 MG TOTAL) BY MOUTH DAILY AS NEEDED. Patient taking differently: Take 20 mg by mouth daily as needed for fluid.  07/14/17  Yes Marcine Matar, MD  gabapentin (NEURONTIN) 300 MG capsule 1 tab PO Qa.m and noon and 2 tabs Q p.m 05/18/17  Yes Marcine Matar, MD  hydrochlorothiazide (HYDRODIURIL) 12.5 MG tablet Take 1 tablet (12.5 mg total) by mouth daily. 06/22/17  Yes Marcine Matar, MD  ipratropium-albuterol (DUONEB) 0.5-2.5 (3) MG/3ML SOLN Take 3 mLs by nebulization every 6 (six) hours as needed. 07/28/17  Yes Mannam, Praveen, MD  montelukast (SINGULAIR) 10 MG tablet Take 1 tablet (10 mg total) by mouth daily. 08/09/17 08/09/18 Yes Mannam, Colbert Coyer, MD  Multiple Vitamins-Minerals (WOMENS MULTI) CAPS Take 1 capsule by mouth daily.   Yes [provider]  pantoprazole (PROTONIX) 40 MG tablet Take 40 mg by mouth daily.   Yes [provider]  promethazine (PHENERGAN) 25 MG tablet Take 1 tablet (25 mg total) by mouth every 8 (eight)  hours as needed for nausea or vomiting. 11/17/16  Yes Funches, Josalyn, MD  sodium chloride (OCEAN) 0.65 % SOLN nasal spray Place 1 spray into both nostrils as needed for congestion. 08/21/17  Yes Phelps, Erin O, PA-C  tiZANidine (ZANAFLEX) 4 MG tablet TAKE 1 TABLET BY MOUTH EVERY 6 HOURS AS NEEDED FOR MUSCLE SPASMS. 06/10/17  Yes Marcine Matar, MD  fluticasone (FLONASE) 50 MCG/ACT nasal spray Place 2 sprays into both nostrils 2 (two) times daily. Patient not taking: Reported on 09/06/2017 07/06/16   Storm Frisk, MD  montelukast (SINGULAIR) 10 MG tablet Take 1 tablet (10 mg total) by mouth at bedtime. Patient not taking: Reported on 09/06/2017 08/14/17   Chilton Greathouse, MD  predniSONE (STERAPRED UNI-PAK 21 TAB) 10 MG (21) TBPK tablet Take by mouth daily. Take 6 tabs by mouth daily  for 2 days, then 5 tabs for 2 days, then 4 tabs for 2 days, then 3 tabs for 2 days, 2 tabs for 2 days, then 1 tab by mouth daily for 2 days 09/05/17   Emi Holes, PA-C  Respiratory Therapy Supplies (FLUTTER) DEVI As directed. 07/28/17   Chilton Greathouse, MD  Spacer/Aero-Holding Deretha Emory (  AEROCHAMBER MV) inhaler Use as instructed 01/25/17   Storm Frisk, MD  sulfamethoxazole-trimethoprim (BACTRIM DS,SEPTRA DS) 800-160 MG tablet Take 1 tablet by mouth 2 (two) times daily. 08/22/17   Joni Reining, PA-C    Inpatient Medications: Scheduled Meds: . azithromycin  500 mg Oral Q24H  . benzonatate  100-200 mg Oral Q8H  . cephALEXin  500 mg Oral Q12H  . DULoxetine  30 mg Oral BID  . enoxaparin (LOVENOX) injection  40 mg Subcutaneous Daily  . furosemide  40 mg Intravenous Daily  . gabapentin  300 mg Oral BID WC  . gabapentin  600 mg Oral QHS  . insulin aspart  0-15 Units Subcutaneous TID WC  . insulin aspart  3 Units Subcutaneous TID WC  . insulin glargine  10 Units Subcutaneous QHS  . ipratropium-albuterol  3 mL Nebulization TID  . mouth rinse  15 mL Mouth Rinse BID  . mometasone-formoterol  2 puff Inhalation  BID  . montelukast  10 mg Oral Daily  . pantoprazole  40 mg Oral QHS  . predniSONE  40 mg Oral Q breakfast   Continuous Infusions:  PRN Meds: diclofenac, ipratropium-albuterol, ketorolac, LORazepam, tiZANidine  Allergies:    Allergies  Allergen Reactions  . Bee Venom Anaphylaxis  . Cinnamon Anaphylaxis  . Doxycycline Hives  . Ibuprofen Other (See Comments)    Abdominal pain  . Lexapro [Escitalopram Oxalate]     Generalized body shaking. ? sz  . Tape Hives and Rash    EKG STICKERS give pt rash, hives    Social History:   Social History   Socioeconomic History  . Marital status: Married    Spouse name: Not on file  . Number of children: Not on file  . Years of education: Not on file  . Highest education level: Not on file  Social Needs  . Financial resource strain: Not on file  . Food insecurity - worry: Not on file  . Food insecurity - inability: Not on file  . Transportation needs - medical: Not on file  . Transportation needs - non-medical: Not on file  Occupational History  . Not on file  Tobacco Use  . Smoking status: Never Smoker  . Smokeless tobacco: Never Used  Substance and Sexual Activity  . Alcohol use: No  . Drug use: No  . Sexual activity: Yes    Birth control/protection: None  Other Topics Concern  . Not on file  Social History Narrative  . Not on file    Family History:    Family History  Problem Relation Age of Onset  . Hypothyroidism Mother   . Diabetes Father   . Diabetes Paternal Uncle   . Hypertension Maternal Grandmother   . Hypertension Maternal Grandfather   . Pancreatic cancer Maternal Grandfather   . Hypertension Paternal Grandmother   . Heart disease Paternal Grandfather   . Hypertension Paternal Grandfather      ROS:  Please see the history of present illness.   All other ROS reviewed and negative.     Physical Exam/Data:   Vitals:   09/06/17 2134 09/06/17 2138 09/06/17 2158 09/07/17 0606  BP:   (!) 155/71 (!)  145/75  Pulse:   (!) 113 100  Resp:   20 20  Temp:   98.3 F (36.8 C) 98.7 F (37.1 C)  TempSrc:   Oral Oral  SpO2: 94% 94% 94% 94%  Weight:      Height:  Intake/Output Summary (Last 24 hours) at 09/07/2017 0844 Last data filed at 09/06/2017 1840 Gross per 24 hour  Intake 582 ml  Output 3450 ml  Net -2868 ml   Filed Weights   09/05/17 1314 09/06/17 0128  Weight: (!) 397 lb (180.1 kg) (!) 399 lb 3.2 oz (181.1 kg)   Body mass index is 66.43 kg/m.  General:  Morbidly obese caucasian female laying in bed sleeping; arouses easily HEENT: sclera anicteric  Neck: unable to assess JVD given body habitus Vascular: No carotid bruits; distal pulses 2+ bilaterally Cardiac:  normal S1, S2; tachycardic, regular rhythm; no murmur, gallops, or rubs Lungs:  clear to auscultation bilaterally, no wheezing, rhonchi or rales; normal work of breathing Abd: NABS, obese, soft, nontender, no hepatomegaly Ext: no edema Musculoskeletal:  No deformities, BUE and BLE strength normal and equal Skin: warm and dry  Neuro:  CNs 2-12 intact, no focal abnormalities noted Psych:  Normal affect   EKG:  The EKG was personally reviewed and demonstrates:  Sinus tachycardia, no STE/D Telemetry:  Telemetry was personally reviewed and demonstrates:  Sinus tachycardia; suspect episode of ?a flutter 2/20 was 2/2 patient movement/artifact as there is no significant change in rate.   Relevant CV Studies: Echo 08/17/2017: Study Conclusions  - Left ventricle: The cavity size was normal. Systolic function was   normal. The estimated ejection fraction was in the range of 60%   to 65%. Wall motion was normal; there were no regional wall   motion abnormalities. Left ventricular diastolic function   parameters were normal. - Aortic valve: There was no regurgitation. - Aortic root: The aortic root was normal in size. - Mitral valve: There was no regurgitation. - Left atrium: The atrium was normal in size. - Right  ventricle: The cavity size was normal. Wall thickness was   normal. Systolic function was normal. - Right atrium: The atrium was normal in size. - Tricuspid valve: There was no regurgitation. - Pulmonary arteries: Systolic pressure was within the normal   range. - Inferior vena cava: The vessel was normal in size. - Pericardium, extracardiac: There was no pericardial effusion.  Impressions:  - Normal study.  Laboratory Data:  Chemistry Recent Labs  Lab 09/05/17 1740 09/07/17 0613  NA 135 142  K 4.2 3.7  CL 97* 102  CO2 26 27  GLUCOSE 296* 210*  BUN 16 20  CREATININE 0.73 0.85  CALCIUM 9.1 8.8*  GFRNONAA >60 >60  GFRAA >60 >60  ANIONGAP 12 13    Recent Labs  Lab 09/05/17 1800 09/07/17 0613  PROT 8.4* 7.4  ALBUMIN 3.5 3.1*  AST 61* 37  ALT 86* 65*  ALKPHOS 79 63  BILITOT 0.5 0.3   Hematology Recent Labs  Lab 09/05/17 1740  WBC 9.9  RBC 4.59  HGB 11.5*  HCT 38.7  MCV 84.3  MCH 25.1*  MCHC 29.7*  RDW 16.5*  PLT 340   Cardiac Enzymes Recent Labs  Lab 09/05/17 1740  TROPONINI <0.03   No results for input(s): TROPIPOC in the last 168 hours.  BNPNo results for input(s): BNP, PROBNP in the last 168 hours.  DDimer  Recent Labs  Lab 09/05/17 1740  DDIMER >20.00*    Radiology/Studies:  Dg Chest 2 View  Result Date: 09/05/2017 CLINICAL DATA:  Shortness of Breath EXAM: CHEST  2 VIEW COMPARISON:  August 22, 2017 FINDINGS: There is no edema or consolidation. Heart size and pulmonary vascularity are normal. No adenopathy. No evident bone lesions. IMPRESSION: No edema  or consolidation. Electronically Signed   By: Bretta Bang III M.D.   On: 09/05/2017 13:39   Ct Angio Chest Pe W/cm &/or Wo Cm  Result Date: 09/05/2017 CLINICAL DATA:  Three-month history of cough and shortness of breath. EXAM: CT ANGIOGRAPHY CHEST WITH CONTRAST TECHNIQUE: Multidetector CT imaging of the chest was performed using the standard protocol during bolus administration of  intravenous contrast. Multiplanar CT image reconstructions and MIPs were obtained to evaluate the vascular anatomy. CONTRAST:  ISOVUE-370 IOPAMIDOL (ISOVUE-370) INJECTION 76% COMPARISON:  Chest radiograph 09/05/2017, chest CT 08/10/2017 FINDINGS: Cardiovascular: Satisfactory opacification of the pulmonary arteries to the segmental level. No evidence of pulmonary embolism. Normal heart size. No pericardial effusion. Mild dilation of the main pulmonary trunk. Mediastinum/Nodes: No enlarged mediastinal, hilar, or axillary lymph nodes. Thyroid gland, trachea, and esophagus demonstrate no significant findings. Lungs/Pleura: Low lung volumes with mosaic attenuation of the lung parenchyma bilaterally. Upper Abdomen: Severe hepatic steatosis. Musculoskeletal: No chest wall abnormality. No acute or significant osseous findings. Review of the MIP images confirms the above findings. IMPRESSION: No evidence of pulmonary embolus. Mosaic attenuation with ground-glass opacities in the lung parenchyma. Such pattern may be due to presence of areas of hypoperfusion caused by pulmonary hypertension, small airway disease, infectious or inflammatory pneumonitis. Severe hepatic steatosis. Electronically Signed   By: Ted Mcalpine M.D.   On: 09/05/2017 21:03    Assessment and Plan:   1. ?paroxysmal atrial flutter on telemetry: upon further review, suspect patient had continued sinus tachycardia 09/06/17 and the pattern is more consistent with artifact/patient movement. Would anticipate a significant change in rate if true atrial flutter. Sinus tachycardia likely being contributed to by round-the-clock nebulizers.  - No need for further cardiac work-up - No indication for rate control or anticoagulation - Can continue to monitor on telemetry for duration of hospitalization  2. History of syncope: patient describes episodes of syncope after showering. Likely vasovagal in nature. Patient is able to anticipate syncope and  get to a safe supine position prior to passing out.  - Could consider an event monitor, however given episodes only occur after showering and device is removed for showers it is unlikely to capture events.  - Encourage safe showering habits   3. HTN: BP stable - Continue home HCTZ at discharge  4. SOB: likely related to asthma. Recent echo 08/17/2017 with normal EF, normal valvular function, and normal PA pressures. Patient received IV lasix 20mg  yesterday without significant improvement in symptoms. UOP with -1.7L since admission. Also on IV abx and steroids for possible PNA.  - No evidence of cardiac cause of SOB - Continue management per primary team  5. Suspected OSA: patient with morbid obesity. Outpatient sleep study ordered, however not scheduled - Would encourage patient to schedule sleep study   For questions or updates, please contact [facility] Please consult [email or web address] for contact info under Cardiology/STEMI.   Signed, Beatriz Stallion, PA-C  09/07/2017 8:44 AM [phone]  Patient examined chart reviewed. Exam with obese white female mild expiratory wheezing No murmur trace LE edema. Review of her telemetry shows SR/ST and one episode of course fluttering that is artifact from arm motion likely. She has a normal Echo and no cardiac symptoms HR on high side due to obesity and Rx asthma  Ok to d/c home no further cardiac w/u needed  [employer]

## 2017-09-07 NOTE — Evaluation (Signed)
Physical Therapy Evaluation Patient Details Name: Courtney Zamora MRN: [id number] DOB: July 23, 1985 Today's Date: 09/07/2017   History of Present Illness  32 yo female admitted with Pna, asthma exacerbation. Hx of morbid obesity, DM, Sz, HTN, chronic pain, anxiety  Clinical Impression  On eval, pt is supervision level for mobility. She walked ~50 feet with a RW. O2 sat >90% on RA, HR up to 140 bpm, dyspnea 3/4 with activity. Will follow and progress activity as tolerated. Do not anticipate any f/u PT needs at discharge.     Follow Up Recommendations No PT follow up    Equipment Recommendations  None recommended by PT    Recommendations for Other Services       Precautions / Restrictions Precautions Precautions: Fall Precaution Comments: monitor HR, O2 Restrictions Weight Bearing Restrictions: No      Mobility  Bed Mobility Overal bed mobility: Modified Independent                Transfers Overall transfer level: Modified independent                  Ambulation/Gait Ambulation/Gait assistance: Supervision Ambulation Distance (Feet): 50 Feet Assistive device: Rolling walker (2 wheeled)       General Gait Details: used walker for safety. No LOB. HR up to 140 bpm, O2 >90% on RA, dyspnea 3/4  Stairs            Wheelchair Mobility    Modified Rankin (Stroke Patients Only)       Balance Overall balance assessment: Mild deficits observed, not formally tested                                           Pertinent Vitals/Pain Pain Assessment: Faces Faces Pain Scale: Hurts even more Pain Location: L knee Pain Descriptors / Indicators: Aching Pain Intervention(s): Limited activity within patient's tolerance    Home Living Family/patient expects to be discharged to:: Private residence Living Arrangements: Spouse/significant other   Type of Home: House Home Access: Stairs to enter   Secretary/administrator of Steps: 2 Home  Layout: One level Home Equipment: Environmental consultant - 4 wheels      Prior Function Level of Independence: Independent with assistive device(s)         Comments: uses walker PRN     Hand Dominance        Extremity/Trunk Assessment   Upper Extremity Assessment Upper Extremity Assessment: Overall WFL for tasks assessed    Lower Extremity Assessment Lower Extremity Assessment: Generalized weakness    Cervical / Trunk Assessment Cervical / Trunk Assessment: Normal  Communication   Communication: No difficulties  Cognition Arousal/Alertness: Awake/alert Behavior During Therapy: WFL for tasks assessed/performed Overall Cognitive Status: Within Functional Limits for tasks assessed                                        General Comments      Exercises     Assessment/Plan    PT Assessment Patient needs continued PT services  PT Problem List Decreased activity tolerance;Decreased mobility;Pain;Cardiopulmonary status limiting activity       PT Treatment Interventions Gait training;Functional mobility training;Therapeutic activities;Therapeutic exercise;DME instruction;Patient/family education    PT Goals (Current goals can be found in the Care Plan section)  Acute Rehab PT  Goals Patient Stated Goal: to feel better PT Goal Formulation: With patient Time For Goal Achievement: 09/21/17 Potential to Achieve Goals: Good    Frequency Min 3X/week   Barriers to discharge        Co-evaluation               AM-PAC PT "6 Clicks" Daily Activity  Outcome Measure Difficulty turning over in bed (including adjusting bedclothes, sheets and blankets)?: A Little Difficulty moving from lying on back to sitting on the side of the bed? : A Little Difficulty sitting down on and standing up from a chair with arms (e.g., wheelchair, bedside commode, etc,.)?: A Little Help needed moving to and from a bed to chair (including a wheelchair)?: A Little Help needed walking in  hospital room?: A Little Help needed climbing 3-5 steps with a railing? : A Little 6 Click Score: 18    End of Session Equipment Utilized During Treatment: Oxygen(at end of session to allow recovery) Activity Tolerance: Patient limited by fatigue(limited by dyspnea) Patient left: in bed;with call bell/phone within reach   PT Visit Diagnosis: Difficulty in walking, not elsewhere classified (R26.2)    Time: 1100-1117 PT Time Calculation (min) (ACUTE ONLY): 17 min   Charges:   PT Evaluation $PT Eval Moderate Complexity: 1 Mod     PT G Codes:         Rebeca Alert, MPT Pager: [phone]

## 2017-09-08 ENCOUNTER — Telehealth: Payer: Self-pay | Admitting: Internal Medicine

## 2017-09-08 DIAGNOSIS — Z794 Long term (current) use of insulin: Secondary | ICD-10-CM

## 2017-09-08 DIAGNOSIS — E119 Type 2 diabetes mellitus without complications: Secondary | ICD-10-CM

## 2017-09-08 LAB — COMPREHENSIVE METABOLIC PANEL
ALT: 67 U/L — ABNORMAL HIGH (ref 14–54)
ANION GAP: 12 (ref 5–15)
AST: 46 U/L — AB (ref 15–41)
Albumin: 3 g/dL — ABNORMAL LOW (ref 3.5–5.0)
Alkaline Phosphatase: 63 U/L (ref 38–126)
BILIRUBIN TOTAL: 0.5 mg/dL (ref 0.3–1.2)
BUN: 26 mg/dL — ABNORMAL HIGH (ref 6–20)
CALCIUM: 8.6 mg/dL — AB (ref 8.9–10.3)
CHLORIDE: 102 mmol/L (ref 101–111)
CO2: 26 mmol/L (ref 22–32)
CREATININE: 0.7 mg/dL (ref 0.44–1.00)
GFR calc Af Amer: >60 mL/min (ref >60)
GFR calc non Af Amer: >60 mL/min (ref >60)
Glucose, Bld: 267 mg/dL — ABNORMAL HIGH (ref 65–99)
Potassium: 3.7 mmol/L (ref 3.5–5.1)
SODIUM: 140 mmol/L (ref 135–145)
Total Protein: 7.1 g/dL (ref 6.5–8.1)

## 2017-09-08 LAB — GLUCOSE, CAPILLARY
GLUCOSE-CAPILLARY: 321 mg/dL — AB (ref 65–99)
Glucose-Capillary: 220 mg/dL — ABNORMAL HIGH (ref 65–99)

## 2017-09-08 LAB — CBC WITH DIFFERENTIAL/PLATELET
BASOS ABS: 0 10*3/uL (ref 0.0–0.1)
BASOS PCT: 0 %
EOS ABS: 0.1 10*3/uL (ref 0.0–0.7)
Eosinophils Relative: 1 %
HEMATOCRIT: 35.9 % — AB (ref 36.0–46.0)
HEMOGLOBIN: 10.2 g/dL — AB (ref 12.0–15.0)
Lymphocytes Relative: 31 %
Lymphs Abs: 3.2 10*3/uL (ref 0.7–4.0)
MCH: 25.1 pg — ABNORMAL LOW (ref 26.0–34.0)
MCHC: 28.4 g/dL — ABNORMAL LOW (ref 30.0–36.0)
MCV: 88.4 fL (ref 78.0–100.0)
MONO ABS: 0.7 10*3/uL (ref 0.1–1.0)
Monocytes Relative: 7 %
NEUTROS ABS: 6.3 10*3/uL (ref 1.7–7.7)
NEUTROS PCT: 61 %
Platelets: 326 10*3/uL (ref 150–400)
RBC: 4.06 MIL/uL (ref 3.87–5.11)
RDW: 16.9 % — AB (ref 11.5–15.5)
WBC: 10.2 10*3/uL (ref 4.0–10.5)

## 2017-09-08 LAB — HEPATITIS PANEL, ACUTE
HCV Ab: 0.1 s/co ratio (ref 0.0–0.9)
HEP A IGM: NEGATIVE
HEP B C IGM: POSITIVE — AB
Hepatitis B Surface Ag: NEGATIVE

## 2017-09-08 LAB — TROPONIN I: Troponin I: <0.03 ng/mL (ref <0.03)

## 2017-09-08 LAB — MAGNESIUM: MAGNESIUM: 2.1 mg/dL (ref 1.7–2.4)

## 2017-09-08 MED ORDER — LEVALBUTEROL HCL 1.25 MG/0.5ML IN NEBU: 1.2500 mg | INHALATION_SOLUTION | Freq: Two times a day (BID) | RESPIRATORY_TRACT | Status: DC

## 2017-09-08 MED ORDER — HYDROXYZINE HCL 10 MG PO TABS: 10.0000 mg | ORAL_TABLET | Freq: Three times a day (TID) | ORAL | 0 refills | Status: DC | PRN

## 2017-09-08 MED ORDER — SENNOSIDES-DOCUSATE SODIUM 8.6-50 MG PO TABS: 1.0000 | ORAL_TABLET | Freq: Every day | ORAL | 0 refills | Status: DC

## 2017-09-08 MED ORDER — AMOXICILLIN-POT CLAVULANATE 875-125 MG PO TABS: 1.0000 | ORAL_TABLET | Freq: Two times a day (BID) | ORAL | 0 refills | Status: AC

## 2017-09-08 MED ORDER — HYDROXYZINE HCL 10 MG PO TABS
10.0000 mg | ORAL_TABLET | Freq: Three times a day (TID) | ORAL | Status: DC | PRN
  Filled 2017-09-08: qty 1

## 2017-09-08 MED ORDER — METFORMIN HCL 500 MG PO TABS: 500.0000 mg | ORAL_TABLET | Freq: Two times a day (BID) | ORAL | 0 refills | Status: DC

## 2017-09-08 MED ORDER — GUAIFENESIN ER 600 MG PO TB12: 600.0000 mg | ORAL_TABLET | Freq: Two times a day (BID) | ORAL | 0 refills | Status: DC

## 2017-09-08 MED ORDER — IPRATROPIUM BROMIDE 0.02 % IN SOLN: 0.5000 mg | Freq: Two times a day (BID) | RESPIRATORY_TRACT | Status: DC

## 2017-09-08 MED ORDER — INSULIN ASPART 100 UNIT/ML ~~LOC~~ SOLN: SUBCUTANEOUS | 0 refills | Status: DC

## 2017-09-08 MED ORDER — PREDNISONE 10 MG PO TABS: ORAL_TABLET | ORAL | 0 refills | Status: DC

## 2017-09-08 MED ORDER — BLOOD GLUCOSE MONITOR KIT: PACK | 0 refills | Status: DC

## 2017-09-08 MED ORDER — INSULIN GLARGINE 100 UNIT/ML ~~LOC~~ SOLN: 10.0000 [IU] | Freq: Every day | SUBCUTANEOUS | 0 refills | Status: DC

## 2017-09-08 MED FILL — predniSONE 10 MG TABS: 10 | 7 days supply | Qty: 15 | Fill #0

## 2017-09-08 MED FILL — !LANTUS 100 UNITS/ML VIAL: 100 | 28 days supply | Qty: 10 | Fill #0

## 2017-09-08 MED FILL — TRUE METRIX TEST STRIP: 25 days supply | Qty: 100 | Fill #0

## 2017-09-08 MED FILL — hydrOXYzine HCL 10 MG TABS: 10 | 10 days supply | Qty: 30 | Fill #0

## 2017-09-08 MED FILL — !TRUE METRIX BLOOD GLUCOSE: 30 days supply | Qty: 1 | Fill #0

## 2017-09-08 MED FILL — AMOX-CLAV 875-125 MG TABLET: 875-125 | 4 days supply | Qty: 8 | Fill #0

## 2017-09-08 MED FILL — TRUEplus LANCETS 28G MISC: 25 days supply | Qty: 100 | Fill #0

## 2017-09-08 MED FILL — !NOVOLOG 100UNITS/ML VIAL: 100/ML | 28 days supply | Qty: 10 | Fill #0

## 2017-09-08 MED FILL — ?METFORMIN HCL 500MG TABLET: 500 | 30 days supply | Qty: 60 | Fill #0

## 2017-09-08 NOTE — Discharge Summary (Signed)
Discharge Summary  KYLIN GENNA ZOX:[id number] DOB: 25-Jul-1985  PCP: Marcine Matar, MD  Admit date: 09/05/2017 Discharge date: 09/08/2017  Time spent: >52mins, more than 50% time spent on coordination of care.  Recommendations for Outpatient Follow-up:  1. F/u with PMD within a week  for hospital discharge follow up, repeat cbc/bmp at follow up. pmd to follow up on hepatitis panel result. pmd to monitor blood glucose . 2. F/u with pulmonology 3. F/u with [facility] for severe cirrhosis and possible esophageal polyps   Discharge Diagnoses:  Active Hospital Problems   Diagnosis Date Noted  . CAP (community acquired pneumonia) 08/03/2017  . DM2 (diabetes mellitus, type 2) (HCC) 09/06/2017  . SOB (shortness of breath) 09/06/2017  . Anxiety 08/17/2016  . Hypertension 08/14/2016  . Morbid (severe) obesity due to excess calories (HCC) 12/29/2015  . Asthma, moderate persistent 12/09/2015    Resolved Hospital Problems   Diagnosis Date Noted Date Resolved  . SOB (shortness of breath) 09/05/2017 09/06/2017    Discharge Condition: stable  Diet recommendation: heart healthy/carb modified  Filed Weights   09/05/17 1314 09/06/17 0128  Weight: (!) 180.1 kg (397 lb) (!) 181.1 kg (399 lb 3.2 oz)    History of present illness:  PCP: Marcine Matar, MD  Patient coming from: Home  I have personally briefly reviewed patient's old medical records in [facility]  Chief Complaint: SOB  HPI: FRANSHESKA WILLINGHAM is a 32 y.o. female with medical history significant of obesity, HTN, anxiety, asthma.  Patient presents to the ED at [facility] with c/o SOB.  Intermittent for past 3 months, worse over last couple of days.  Had been better in Jan with steroids that pulm put her on for possible asthma, but got worse in last couple of days.  Associated cough, CP worse with taking deep breath.   ED Course: D.Dimer positive, CTA neg for PE but did find ground glass appearance.  Patient put on  rocephin, azithromycin, prednisone, and admitted.    Hospital Course:  Principal Problem:   CAP (community acquired pneumonia) Active Problems:   Asthma, moderate persistent   Morbid (severe) obesity due to excess calories (HCC)   Hypertension   Anxiety   DM2 (diabetes mellitus, type 2) (HCC)   SOB (shortness of breath)  Bilateral groundglass capacity, short of breath, cough -Respiratory viral panel negative, MRSa negative, sputum culture normal flora ( per personal communication of lab at 2 [phone])  -Pulmonology consulted who recommend treat as an asthma exacerbation with  steroids /nebs antibiotics -She does appear volume overloaded, good urine output on iv  Lasix 40mg  daily. -Improving she ambulated on room air O2 sat remain above 90%,  -she is cleared to discharge by pulmonology, she needs pulmonology outpatient follow-up, detail please refer to pulmonology consult note. -she is discharged on prednisone taper and augmentin.     sinus tachycardia,  from frequent nebs? Improved.  There is a concern of paroxysmal  Aflutter on tele , per cardiology is artifact  TSH unremarkable , echo obtained a few weeks ago unremarkable Keep K above 4, mag > 2  cardiology input appreciated  Diabetes, new diagnosis -She reports she has been on steroids for the last few months -A1c9.6 -Start insulin,  d/c on metformin and insulin.  She requested Ativan with injection. She is observed did not need Ativan for Lovenox injection in the hospital.  I do not plan to discharge her on Ativan -Encouraged weight loss pmd follow up   Elevated  LFT Severe hepatic steatosis on imaging,  GI follow-up hepatitis panel in process  Incidental finding on CT scan from 1/24 "Multiple nodular appearing soft tissue densities in the distal third of the esophagus, concerning for multiple small polyps. Further evaluation with nonemergent endoscopy should be considered in the near future to better evaluate  these findings." She is scheduled to see GI today, canceled her appointment. I have discussed case with GI who recommend reschedule outpatient follow-up.  Morbid obesity Body mass index is 66.43 kg/m. Encourage weight loss She is followed with pulmonology who plan to get her outpatient sleep study  Code Status: full  Family Communication: patient   Disposition Plan: DC home, need outpatient primary care doctor follow-up, pulmonology, outpatient sleep study ,outpatient weight loss ,outpatient GI follow-up   Consultants:  pulmonology  Cardiology  Phone conversation with GI on 2/21  Procedures:  none  Antibiotics:  Rocephinx1, then keflex  zithromax   Discharge Exam: BP (!) 155/101 (BP Location: Right Arm)   Pulse (!) 103   Temp 98 F (36.7 C) (Oral)   Resp (!) 22   Ht 5\' 5"  (1.651 m)   Wt (!) 181.1 kg (399 lb 3.2 oz)   LMP 06/12/2017   SpO2 95%   BMI 66.43 kg/m   General: NAD, obese Cardiovascular: RRR Respiratory: CTABL  Discharge Instructions You were cared for by a hospitalist during your hospital stay. If you have any questions about your discharge medications or the care you received while you were in the hospital after you are discharged, you can call the unit and asked to speak with the hospitalist on call if the hospitalist that took care of you is not available. Once you are discharged, your primary care physician will handle any further medical issues. Please note that NO REFILLS for any discharge medications will be authorized once you are discharged, as it is imperative that you return to your primary care physician (or establish a relationship with a primary care physician if you do not have one) for your aftercare needs so that they can reassess your need for medications and monitor your lab values.  Discharge Instructions    Diet - low sodium heart healthy   Complete by:  As directed    Carb modified   Increase activity slowly    Complete by:  As directed      Allergies as of 09/08/2017      Reactions   Bee Venom Anaphylaxis   Cinnamon Anaphylaxis   Doxycycline Hives   Ibuprofen Other (See Comments)   Abdominal pain   Lexapro [escitalopram Oxalate]    Generalized body shaking. ? sz   Tape Hives, Rash   EKG STICKERS give pt rash, hives      Medication List    STOP taking these medications   hydrochlorothiazide 12.5 MG tablet Commonly known as:  HYDRODIURIL   sulfamethoxazole-trimethoprim 800-160 MG tablet Commonly known as:  BACTRIM DS,SEPTRA DS     TAKE these medications   acetaminophen-codeine 300-30 MG tablet Commonly known as:  TYLENOL #3 Take 1 tablet by mouth every 4 (four) hours as needed for moderate pain.   AEROCHAMBER MV inhaler Use as instructed   albuterol (2.5 MG/3ML) 0.083% nebulizer solution Commonly known as:  PROVENTIL Take 3 mLs (2.5 mg total) by nebulization every 6 (six) hours as needed for wheezing or shortness of breath.   albuterol 108 (90 Base) MCG/ACT inhaler Commonly known as:  PROVENTIL HFA;VENTOLIN HFA Inhale 2 puffs into the lungs every  6 (six) hours as needed for wheezing or shortness of breath (wheezing). For shortness of breath.   amoxicillin-clavulanate 875-125 MG tablet Commonly known as:  AUGMENTIN Take 1 tablet by mouth 2 (two) times daily for 4 days.   benzonatate 100 MG capsule Commonly known as:  TESSALON Take 1-2 capsules (100-200 mg total) by mouth every 8 (eight) hours.   budesonide-formoterol 160-4.5 MCG/ACT inhaler Commonly known as:  SYMBICORT Inhale 2 puffs into the lungs 2 (two) times daily.   Calcium Citrate 200 MG Tabs Take 2 tablets (400 mg total) by mouth every other day. What changed:  when to take this   cetirizine 10 MG tablet Commonly known as:  ZYRTEC Take 1 tablet (10 mg total) by mouth daily.   diclofenac 75 MG EC tablet Commonly known as:  VOLTAREN Take 1 tablet (75 mg total) by mouth 2 (two) times daily as needed. What  changed:  reasons to take this   diclofenac sodium 1 % Gel Commonly known as:  VOLTAREN Apply 2 g topically 4 (four) times daily.   DULoxetine 30 MG capsule Commonly known as:  CYMBALTA Take 1 capsule (30 mg total) by mouth 2 (two) times daily.   fluticasone 50 MCG/ACT nasal spray Commonly known as:  FLONASE Place 2 sprays into both nostrils 2 (two) times daily.   FLUTTER Devi As directed.   furosemide 20 MG tablet Commonly known as:  LASIX TAKE 1 TABLET (20 MG TOTAL) BY MOUTH DAILY AS NEEDED. What changed:  reasons to take this   gabapentin 300 MG capsule Commonly known as:  NEURONTIN 1 tab PO Qa.m and noon and 2 tabs Q p.m   guaiFENesin 600 MG 12 hr tablet Commonly known as:  MUCINEX Take 1 tablet (600 mg total) by mouth 2 (two) times daily.   hydrOXYzine 10 MG tablet Commonly known as:  ATARAX/VISTARIL Take 1 tablet (10 mg total) by mouth 3 (three) times daily as needed for anxiety.   insulin aspart 100 UNIT/ML injection Commonly known as:  novoLOG Insulin sliding scale: Blood sugar  120-150   3units                       151-200   4units                       201-250   7units                       251- 300  11units                       301-350   15uints                       351-400   20units                       >400         call MD immediately   insulin glargine 100 UNIT/ML injection Commonly known as:  LANTUS Inject 0.1 mLs (10 Units total) into the skin at bedtime.   ipratropium-albuterol 0.5-2.5 (3) MG/3ML Soln Commonly known as:  DUONEB Take 3 mLs by nebulization every 6 (six) hours as needed.   metFORMIN 500 MG tablet Commonly known as:  GLUCOPHAGE Take 1 tablet (500 mg total) by mouth 2 (two) times daily with a meal.   montelukast 10 MG tablet  Commonly known as:  SINGULAIR Take 1 tablet (10 mg total) by mouth daily. What changed:  Another medication with the same name was removed. Continue taking this medication, and follow the directions you  see here.   pantoprazole 40 MG tablet Commonly known as:  PROTONIX Take 40 mg by mouth daily.   predniSONE 10 MG tablet Commonly known as:  DELTASONE Take 30mg s (3tabs) daily for three days, then 20mg  (2tabs) daily for two days, then 10mg  daily for two days , then stop.   promethazine 25 MG tablet Commonly known as:  PHENERGAN Take 1 tablet (25 mg total) by mouth every 8 (eight) hours as needed for nausea or vomiting.   senna-docusate 8.6-50 MG tablet Commonly known as:  Senokot-S Take 1 tablet by mouth at bedtime.   sodium chloride 0.65 % Soln nasal spray Commonly known as:  OCEAN Place 1 spray into both nostrils as needed for congestion.   tiZANidine 4 MG tablet Commonly known as:  ZANAFLEX TAKE 1 TABLET BY MOUTH EVERY 6 HOURS AS NEEDED FOR MUSCLE SPASMS.   Vitamin D3 5000 units Caps Take 5,000 Units by mouth once a week.   WOMENS MULTI Caps Take 1 capsule by mouth daily.      Allergies  Allergen Reactions  . Bee Venom Anaphylaxis  . Cinnamon Anaphylaxis  . Doxycycline Hives  . Ibuprofen Other (See Comments)    Abdominal pain  . Lexapro [Escitalopram Oxalate]     Generalized body shaking. ? sz  . Tape Hives and Rash    EKG STICKERS give pt rash, hives   Follow-up Information    Zehr, Princella Pellegrini, PA-C Follow up on 09/19/2017.   Specialty:  Gastroenterology Why:  9:30 am Contact information: [address] [place] [address] [phone]        [facility]. Schedule an appointment as soon as possible for a visit.   Why:  within 1 week for pcp; pcp to monitor blood glucose control. Go to their pharmacy @ d/c to have meds filled. Contact information: [address] [place] [address] [phone]       Chilton Greathouse, MD Follow up.   Specialty:  Pulmonary Disease Contact information: [address] [place] [address] [phone]            The results of significant  diagnostics from this hospitalization (including imaging, microbiology, ancillary and laboratory) are listed below for reference.    Significant Diagnostic Studies: Dg Chest 2 View  Result Date: 09/05/2017 CLINICAL DATA:  Shortness of Breath EXAM: CHEST  2 VIEW COMPARISON:  August 22, 2017 FINDINGS: There is no edema or consolidation. Heart size and pulmonary vascularity are normal. No adenopathy. No evident bone lesions. IMPRESSION: No edema or consolidation. Electronically Signed   By: Bretta Bang III M.D.   On: 09/05/2017 13:39   Dg Chest 2 View  Result Date: 08/22/2017 CLINICAL DATA:  32 year old female with productive cough. Subsequent encounter. EXAM: CHEST  2 VIEW COMPARISON:  08/10/2017 chest CT.  08/08/2017 chest x-ray. FINDINGS: No segmental consolidation. CT detected subtle parenchymal opacities below the threshold of plain film detection. No pulmonary edema.  No pneumothorax. Heart size within normal limits. No acute osseous abnormality. IMPRESSION: No acute pulmonary abnormality noted. Electronically Signed   By: Lacy Duverney M.D.   On: 08/22/2017 13:55   Dg Abd 1 View  Result Date: 09/07/2017 CLINICAL DATA:  Nausea and vomiting EXAM: ABDOMEN - 1 VIEW  COMPARISON:  None. FINDINGS: Nonobstructed bowel-gas pattern with moderate stool in the colon. No abnormal calcification IMPRESSION: Nonobstructed bowel-gas pattern Electronically Signed   By: Jasmine Pang M.D.   On: 09/07/2017 18:38   Ct Chest Wo Contrast  Result Date: 08/10/2017 CLINICAL DATA:  32 year old female with history of pneumonia. Productive cough. Recently finished antibiotics. EXAM: CT CHEST WITHOUT CONTRAST TECHNIQUE: Multidetector CT imaging of the chest was performed following the standard protocol without IV contrast. COMPARISON:  No priors. FINDINGS: Cardiovascular: Heart size is normal. There is no significant pericardial fluid, thickening or pericardial calcification. No atherosclerotic calcifications in the  thoracic aorta or the coronary arteries. Dilatation of the pulmonic trunk (3.3 cm in diameter), concerning for pulmonary arterial hypertension. Mediastinum/Nodes: No pathologically enlarged mediastinal or hilar lymph nodes. Please note that accurate exclusion of hilar adenopathy is limited on noncontrast CT scans. Numerous nodular appearing areas of soft tissue density in the distal third of the esophagus, concerning for multiple small polyps. No axillary lymphadenopathy. Lungs/Pleura: There are few patchy areas of very mild ground-glass attenuation scattered throughout the lungs bilaterally. No confluent consolidative airspace disease. No pleural effusions. No suspicious appearing pulmonary nodules or masses. Upper Abdomen: Severe diffuse low attenuation throughout the hepatic parenchyma, indicative of severe hepatic steatosis. Musculoskeletal: There are no aggressive appearing lytic or blastic lesions noted in the visualized portions of the skeleton. IMPRESSION: 1. There is a pattern of very mild diffuse but patchy areas of ground-glass attenuation scattered throughout the lungs bilaterally. This is highly nonspecific, but may reflect a resolving multilobar bronchopneumonia. 2. Dilatation of the pulmonic trunk (3.3 cm in diameter), concerning for pulmonary arterial hypertension. 3. Severe hepatic steatosis. 4. Multiple nodular appearing soft tissue densities in the distal third of the esophagus, concerning for multiple small polyps. Further evaluation with nonemergent endoscopy should be considered in the near future to better evaluate these findings. Electronically Signed   By: Trudie Reed M.D.   On: 08/10/2017 15:16   Ct Angio Chest Pe W/cm &/or Wo Cm  Result Date: 09/05/2017 CLINICAL DATA:  Three-month history of cough and shortness of breath. EXAM: CT ANGIOGRAPHY CHEST WITH CONTRAST TECHNIQUE: Multidetector CT imaging of the chest was performed using the standard protocol during bolus administration  of intravenous contrast. Multiplanar CT image reconstructions and MIPs were obtained to evaluate the vascular anatomy. CONTRAST:  ISOVUE-370 IOPAMIDOL (ISOVUE-370) INJECTION 76% COMPARISON:  Chest radiograph 09/05/2017, chest CT 08/10/2017 FINDINGS: Cardiovascular: Satisfactory opacification of the pulmonary arteries to the segmental level. No evidence of pulmonary embolism. Normal heart size. No pericardial effusion. Mild dilation of the main pulmonary trunk. Mediastinum/Nodes: No enlarged mediastinal, hilar, or axillary lymph nodes. Thyroid gland, trachea, and esophagus demonstrate no significant findings. Lungs/Pleura: Low lung volumes with mosaic attenuation of the lung parenchyma bilaterally. Upper Abdomen: Severe hepatic steatosis. Musculoskeletal: No chest wall abnormality. No acute or significant osseous findings. Review of the MIP images confirms the above findings. IMPRESSION: No evidence of pulmonary embolus. Mosaic attenuation with ground-glass opacities in the lung parenchyma. Such pattern may be due to presence of areas of hypoperfusion caused by pulmonary hypertension, small airway disease, infectious or inflammatory pneumonitis. Severe hepatic steatosis. Electronically Signed   By: Ted Mcalpine M.D.   On: 09/05/2017 21:03   Ct Maxillofacial Ltd Wo Cm  Result Date: 08/10/2017 CLINICAL DATA:  Postnasal drip. Recurrent sinusitis. Productive cough. Sinus pressure and drainage. EXAM: CT PARANASAL SINUS LIMITED WITHOUT CONTRAST TECHNIQUE: Non-contiguous multidetector CT images of the paranasal sinuses were obtained in a single  plane without contrast. COMPARISON:  Head CT 10/02/2016 FINDINGS: The visualized portions of the paranasal sinuses are clear without evidence of significant mucosal thickening or fluid. Left petrous apex pneumatization extends anteriorly into the body of the sphenoid bone. The visualized mastoid air cells are clear. The regional soft tissues are grossly unremarkable.  IMPRESSION: Clear sinuses. Electronically Signed   By: Sebastian Ache M.D.   On: 08/10/2017 12:56    Microbiology: Recent Results (from the past 240 hour(s))  MRSA PCR Screening     Status: None   Collection Time: 09/06/17  1:40 AM  Result Value Ref Range Status   MRSA by PCR NEGATIVE NEGATIVE Final    Comment:        The GeneXpert MRSA Assay (FDA approved for NASAL specimens only), is one component of a comprehensive MRSA colonization surveillance program. It is not intended to diagnose MRSA infection nor to guide or monitor treatment for MRSA infections. Performed at [facility], [address] [place], [place] [address]   Respiratory Panel by PCR     Status: None   Collection Time: 09/06/17  1:40 AM  Result Value Ref Range Status   Adenovirus NOT DETECTED NOT DETECTED Final   Coronavirus 229E NOT DETECTED NOT DETECTED Final   Coronavirus HKU1 NOT DETECTED NOT DETECTED Final   Coronavirus NL63 NOT DETECTED NOT DETECTED Final   Coronavirus OC43 NOT DETECTED NOT DETECTED Final   Metapneumovirus NOT DETECTED NOT DETECTED Final   Rhinovirus / Enterovirus NOT DETECTED NOT DETECTED Final   Influenza A NOT DETECTED NOT DETECTED Final   Influenza B NOT DETECTED NOT DETECTED Final   Parainfluenza Virus 1 NOT DETECTED NOT DETECTED Final   Parainfluenza Virus 2 NOT DETECTED NOT DETECTED Final   Parainfluenza Virus 3 NOT DETECTED NOT DETECTED Final   Parainfluenza Virus 4 NOT DETECTED NOT DETECTED Final   Respiratory Syncytial Virus NOT DETECTED NOT DETECTED Final   Bordetella pertussis NOT DETECTED NOT DETECTED Final   Chlamydophila pneumoniae NOT DETECTED NOT DETECTED Final   Mycoplasma pneumoniae NOT DETECTED NOT DETECTED Final    Comment: Performed at [facility], [address] [place], [place] [address]  Culture, sputum-assessment     Status: None   Collection Time: 09/06/17  3:50 AM  Result Value Ref Range Status   Specimen Description SPUTUM   Final   Special Requests NONE  Final   Sputum evaluation   Final    THIS SPECIMEN IS ACCEPTABLE FOR SPUTUM CULTURE Performed at [facility], [address] [place], [place] [address]    Report Status 09/07/2017 FINAL  Final  Culture, respiratory (NON-Expectorated)     Status: None (Preliminary result)   Collection Time: 09/06/17  3:50 AM  Result Value Ref Range Status   Specimen Description   Final    SPUTUM Performed at [facility], [address] [place], [place] [address]    Special Requests   Final    NONE Reflexed from [id number] Performed at [facility], [address], [place], [place] [address]    Gram Stain   Final    MODERATE WBC PRESENT, PREDOMINANTLY PMN MODERATE GRAM NEGATIVE RODS MODERATE GRAM POSITIVE COCCI IN CHAINS FEW GRAM POSITIVE RODS FEW GRAM NEGATIVE COCCOBACILLI    Culture   Final    CULTURE REINCUBATED FOR BETTER GROWTH Performed at [facility], [address], [place], [place] [address]    Report Status PENDING  Incomplete  Labs: Basic Metabolic Panel: Recent Labs  Lab 09/05/17 1740 09/07/17 0613 09/08/17 0635  NA 135 142 140  K 4.2 3.7 3.7  CL 97* 102 102  CO2 26 27 26   GLUCOSE 296* 210* 267*  BUN 16 20 26*  CREATININE 0.73 0.85 0.70  CALCIUM 9.1 8.8* 8.6*  MG  --  2.1 2.1   Liver Function Tests: Recent Labs  Lab 09/05/17 1800 09/07/17 0613 09/08/17 0635  AST 61* 37 46*  ALT 86* 65* 67*  ALKPHOS 79 63 63  BILITOT 0.5 0.3 0.5  PROT 8.4* 7.4 7.1  ALBUMIN 3.5 3.1* 3.0*   No results for input(s): LIPASE, AMYLASE in the last 168 hours. No results for input(s): AMMONIA in the last 168 hours. CBC: Recent Labs  Lab 09/05/17 1740 09/08/17 0635  WBC 9.9 10.2  NEUTROABS 8.3* 6.3  HGB 11.5* 10.2*  HCT 38.7 35.9*  MCV 84.3 88.4  PLT 340 326   Cardiac Enzymes: Recent Labs  Lab 09/05/17 1740 09/08/17 0635  TROPONINI <0.03 <0.03   BNP: BNP (last 3  results) No results for input(s): BNP in the last 8760 hours.  ProBNP (last 3 results) No results for input(s): PROBNP in the last 8760 hours.  CBG: Recent Labs  Lab 09/07/17 1237 09/07/17 1420 09/07/17 1913 09/07/17 2232 09/08/17 0800  GLUCAP 315* 214* 251* 264* 220*       Signed:  Albertine Grates MD, PhD  [facility] 09/08/2017, 11:00 AM

## 2017-09-08 NOTE — Progress Notes (Signed)
Pt given discharge teaching/instructions including medications and schedules of medications. Pt given Diabetes teaching including Pt demonstrating how to prick finger to check blood sugar and apply to the meter. Sliding Scale Insulin teaching gone over with Pt. Pt able to return demonstration on drawing correct amount of Insulin into syringe and then cleaning site in the abdomen and Pt able to give herself Insulin Shot. Diabetes Coordinator also in to see Pt prior to discharge. Registered Dietician has also seen Pt prior to discharge. Pt verbalized understanding off all discharge teaching and instructions. Discharged to home via wheelchair discharge packet with Prescriptions with Pt at time of discharge.

## 2017-09-08 NOTE — Progress Notes (Signed)
Pt sats are 95% on room air but  Pt wants 2L nasal cannula.  MD is aware.

## 2017-09-08 NOTE — Care Management Note (Signed)
Case Management Note  Patient Details  Name: Courtney Zamora MRN: [id number] Date of Birth: [birth year]  Subjective/Objective: No further CM needs.                   Action/Plan:d/c home.   Expected Discharge Date:  09/08/17               Expected Discharge Plan:  Home/Self Care  In-House Referral:     Discharge planning Services  CM Consult, [facility]  Post Acute Care Choice:    Choice offered to:     DME Arranged:    DME Agency:     HH Arranged:    HH Agency:     Status of Service:  Completed, signed off  If discussed at [employer], dates discussed:    Additional Comments:  Lanier Clam, RN 09/08/2017, 10:31 AM

## 2017-09-08 NOTE — Plan of Care (Signed)
Nutrition Education Note  RD consulted for nutrition education regarding diabetes.  Pt is newly diagnosed with diabetes and unfamiliar with dietary changes necessary. Pt states that she usually eats 1-2 meals daily. Pt wakes up at 9:00 am and may not eat until noon. For lunch, pt may have a bowl of cereal or a chicken salad wrap from [employer]. Pt usually does not eat again until anytime between 5:00-8:00 pm. Dinner may include a meat (usually chicken) with 1-2 vegetables and a starch.  Lab Results  Component Value Date   HGBA1C 9.6 (H) 09/05/2017   RD provided "Carbohydrate Counting for People with Diabetes" handout from the Academy of Nutrition and Dietetics. Discussed different food groups and their effects on blood sugar, emphasizing carbohydrate-containing foods. Provided list of carbohydrates and recommended serving sizes of common foods. Discussed importance of pairing a carbohydrate food with a protein and a fat for blood sugar control.  Discussed importance of controlled and consistent carbohydrate intake throughout the day. Provided examples of ways to balance meals/snacks and encouraged intake of high-fiber, whole grain complex carbohydrates. Teach back method used.  Expect fair compliance.  Body mass index is 66.43 kg/m. Pt meets criteria for Obesity, Class III based on current BMI.  Current diet order is Carb Modified, patient is consuming approximately 100% of meals at this time. Labs and medications reviewed. No further nutrition interventions warranted at this time. RD contact information provided. If additional nutrition issues arise, please re-consult RD.  Earma Reading, MS, RD, LDN Pager: [phone] Weekend/After Hours: [phone]

## 2017-09-08 NOTE — Telephone Encounter (Signed)
Pt called a refill for LORazepam (ATIVAN) tablet 1 mg   Since she was release from  the ED and was not given any more medication, if you auth please call the pt  Please sent it [facility] - [place], [place] - [place] [address] [employer]

## 2017-09-09 ENCOUNTER — Emergency Department (HOSPITAL_COMMUNITY): Payer: No Typology Code available for payment source

## 2017-09-09 ENCOUNTER — Emergency Department (HOSPITAL_COMMUNITY)
Admission: EM | Admit: 2017-09-09 | Discharge: 2017-09-10 | Disposition: A | Discharge status: Home / Self Care | Payer: No Typology Code available for payment source | Attending: Emergency Medicine | Admitting: Emergency Medicine

## 2017-09-09 ENCOUNTER — Encounter (HOSPITAL_COMMUNITY): Payer: Self-pay | Admitting: Nurse Practitioner

## 2017-09-09 DIAGNOSIS — E119 Type 2 diabetes mellitus without complications: Secondary | ICD-10-CM | POA: Insufficient documentation

## 2017-09-09 DIAGNOSIS — R079 Chest pain, unspecified: Secondary | ICD-10-CM

## 2017-09-09 DIAGNOSIS — R519 Headache, unspecified: Secondary | ICD-10-CM

## 2017-09-09 DIAGNOSIS — J45909 Unspecified asthma, uncomplicated: Secondary | ICD-10-CM | POA: Insufficient documentation

## 2017-09-09 DIAGNOSIS — R51 Headache: Secondary | ICD-10-CM | POA: Insufficient documentation

## 2017-09-09 DIAGNOSIS — I10 Essential (primary) hypertension: Secondary | ICD-10-CM | POA: Insufficient documentation

## 2017-09-09 DIAGNOSIS — Z79899 Other long term (current) drug therapy: Secondary | ICD-10-CM | POA: Insufficient documentation

## 2017-09-09 LAB — CBG MONITORING, ED: Glucose-Capillary: 216 mg/dL — ABNORMAL HIGH (ref 65–99)

## 2017-09-09 MED ORDER — LORAZEPAM 1 MG PO TABS
1.0000 mg | ORAL_TABLET | Freq: Once | ORAL | Status: AC
  Administered 2017-09-09: 1 mg via ORAL
  Filled 2017-09-09: qty 1

## 2017-09-09 NOTE — ED Provider Notes (Signed)
[place] DEPT Provider Note   CSN: [id number] Arrival date & time: 09/09/17  2201     History   Chief Complaint No chief complaint on file.   HPI Courtney Zamora is a 32 y.o. female who presents the emergency department with complaint of chest pain and headache.  Patient was discharged from the hospital yesterday after admission for shortness of breath.  Her workup revealed groundglass opacities, pulmonology as if she had an asthma exacerbation.  She is also found to have severe steatohepatitis and cirrhosis of the liver along with a new diagnosis of diabetes is likely secondary to her morbid obesity and steroid use for breathing.  Patient states that this evening she began to have left-sided she denies visual disturbances, nausea, vomiting, unilateral weakness, neck stiffness, difficulty with speech or swallowing, gait abnormalities or other neurologic issues.  She states that she has had migraine-like headaches in the past which are similar however she is concerned by the chest pain.  She denies hemoptysis, pleuritic chest pain.  She had a CT angios during her workup within the past 5 days and was negative for pulmonary embolus.  HPI  Past Medical History:  Diagnosis Date  . Anxiety   . Asthma   . Depression   . Essential hypertension   . GERD (gastroesophageal reflux disease)   . Obesity   . Osteoarthritis, knee   . Seizures ([place])   . Severe needle phobia     Patient Active Problem List   Diagnosis Date Noted  . DM2 (diabetes mellitus, type 2) ([place]) 09/06/2017  . SOB (shortness of breath) 09/06/2017  . CAP (community acquired pneumonia) 08/03/2017  . Acute frontal sinusitis 12/15/2016  . Metatarsal stress fracture, right, sequela 11/17/2016  . Neck muscle spasm 10/05/2016  . Nipple discharge in female 09/13/2016  . Depression 08/17/2016  . Anxiety 08/17/2016  . ASCUS with positive high risk HPV cervical 08/14/2016  . Hypertension  08/14/2016  . Long term (current) use of systemic steroids 08/14/2016  . Esophageal reflux 12/29/2015  . Morbid (severe) obesity due to excess calories ([place]) 12/29/2015  . Chronic pain of right knee 12/29/2015  . Chronic pain of left ankle 12/29/2015  . 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 Asthma, moderate persistent 12/09/2015    Past Surgical History:  Procedure Laterality Date  . TONSILLECTOMY      OB History    Gravida Para Term Preterm AB Living   1       1 0   SAB TAB Ectopic Multiple Live Births   1               Home Medications    Prior to Admission medications   Medication Sig Start Date End Date Taking? Authorizing Provider  acetaminophen-codeine (TYLENOL #3) 300-30 MG tablet Take 1 tablet by mouth every 4 (four) hours as needed for moderate pain.    [provider]  albuterol (PROVENTIL HFA;VENTOLIN HFA) 108 (90 Base) MCG/ACT inhaler Inhale 2 puffs into the lungs every 6 (six) hours as needed for wheezing or shortness of breath (wheezing). For shortness of breath. 04/06/17   Ladell Pier, MD  albuterol (PROVENTIL) (2.5 MG/3ML) 0.083% nebulizer solution Take 3 mLs (2.5 mg total) by nebulization every 6 (six) hours as needed for wheezing or shortness of breath. 11/17/16   Boykin Nearing, MD  amoxicillin-clavulanate (AUGMENTIN) 875-125 MG tablet Take 1 tablet by mouth 2 (two) times daily for 4 days. 09/08/17 09/12/17  Florencia Reasons, MD  benzonatate (TESSALON) 100 MG capsule Take 1-2 capsules (100-200 mg total) by mouth every 8 (eight) hours. 08/21/17   Noe Gens, PA-C  blood glucose meter kit and supplies KIT Dispense based on patient and insurance preference. Use up to four times daily as directed. (FOR ICD-9 250.00, 250.01). 09/08/17   Florencia Reasons, MD  budesonide-formoterol [facility]) 160-4.5 MCG/ACT inhaler Inhale 2 puffs into the lungs 2 (two) times daily. 03/18/16   Funches, Adriana Mccallum, MD  Calcium Citrate 200 MG TABS Take 2 tablets (400 mg total) by mouth every other  day. Patient taking differently: Take 400 mg by mouth at bedtime.  10/05/16   Rowe Clack, MD  cetirizine (ZYRTEC) 10 MG tablet Take 1 tablet (10 mg total) by mouth daily. 11/22/16   Elsie Stain, MD  Cholecalciferol (VITAMIN D3) 5000 units CAPS Take 5,000 Units by mouth once a week.    [provider]  diclofenac (VOLTAREN) 75 MG EC tablet Take 1 tablet (75 mg total) by mouth 2 (two) times daily as needed. Patient taking differently: Take 75 mg by mouth 2 (two) times daily as needed for mild pain.  05/10/17   Jessy Oto, MD  diclofenac sodium (VOLTAREN) 1 % GEL Apply 2 g topically 4 (four) times daily. 05/04/17   Jessy Oto, MD  DULoxetine (CYMBALTA) 30 MG capsule Take 1 capsule (30 mg total) by mouth 2 (two) times daily. 08/15/17   Ladell Pier, MD  fluticasone (FLONASE) 50 MCG/ACT nasal spray Place 2 sprays into both nostrils 2 (two) times daily. Patient not taking: Reported on 09/06/2017 07/06/16   Elsie Stain, MD  furosemide (LASIX) 20 MG tablet TAKE 1 TABLET (20 MG TOTAL) BY MOUTH DAILY AS NEEDED. Patient taking differently: Take 20 mg by mouth daily as needed for fluid.  07/14/17   Ladell Pier, MD  gabapentin (NEURONTIN) 300 MG capsule 1 tab PO Qa.m and noon and 2 tabs Q p.m 05/18/17   Ladell Pier, MD  guaiFENesin (MUCINEX) 600 MG 12 hr tablet Take 1 tablet (600 mg total) by mouth 2 (two) times daily. 09/08/17   Florencia Reasons, MD  hydrOXYzine (ATARAX/VISTARIL) 10 MG tablet Take 1 tablet (10 mg total) by mouth 3 (three) times daily as needed for anxiety. 09/08/17   Florencia Reasons, MD  insulin aspart (NOVOLOG) 100 UNIT/ML injection Insulin sliding scale: Blood sugar  120-150   3units                       151-200   4units                       201-250   7units                       251- 300  11units                       301-350   15uints                       351-400   20units                       >400         call MD immediately 09/08/17   Florencia Reasons,  MD  insulin glargine (LANTUS) 100 UNIT/ML injection Inject 0.1 mLs (10 Units total) into the  skin at bedtime. 09/08/17   Florencia Reasons, MD  ipratropium-albuterol (DUONEB) 0.5-2.5 (3) MG/3ML SOLN Take 3 mLs by nebulization every 6 (six) hours as needed. 07/28/17   Mannam, Hart Robinsons, MD  metFORMIN (GLUCOPHAGE) 500 MG tablet Take 1 tablet (500 mg total) by mouth 2 (two) times daily with a meal. 09/08/17 09/08/18  Florencia Reasons, MD  montelukast (SINGULAIR) 10 MG tablet Take 1 tablet (10 mg total) by mouth daily. 08/09/17 08/09/18  Marshell Garfinkel, MD  Multiple Vitamins-Minerals (WOMENS MULTI) CAPS Take 1 capsule by mouth daily.    [provider]  pantoprazole (PROTONIX) 40 MG tablet Take 40 mg by mouth daily.    [provider]  predniSONE (DELTASONE) 10 MG tablet Take 79ms (3tabs) daily for three days, then 276m(2tabs) daily for two days, then 1047maily for two days , then stop. 09/08/17   Xu,Florencia ReasonsD  promethazine (PHENERGAN) 25 MG tablet Take 1 tablet (25 mg total) by mouth every 8 (eight) hours as needed for nausea or vomiting. 11/17/16   FunBoykin NearingD  Respiratory Therapy Supplies (FLUTTER) DEVI As directed. 07/28/17   Mannam, PraHart RobinsonsD  senna-docusate (SENOKOT-S) 8.6-50 MG tablet Take 1 tablet by mouth at bedtime. 09/08/17   Xu,Florencia ReasonsD  sodium chloride (OCEAN) 0.65 % SOLN nasal spray Place 1 spray into both nostrils as needed for congestion. 08/21/17   PheNoe GensA-C  Spacer/Aero-Holding Chambers (AEROCHAMBER MV) inhaler Use as instructed 01/25/17   WriElsie StainD  tiZANidine (ZANAFLEX) 4 MG tablet TAKE 1 TABLET BY MOUTH EVERY 6 HOURS AS NEEDED FOR MUSCLE SPASMS. 06/10/17   JohLadell PierD    Family History Family History  Problem Relation Age of Onset  . Hypothyroidism Mother   . Diabetes Father   . Diabetes Paternal Uncle   . Hypertension Maternal Grandmother   . Hypertension Maternal Grandfather   . Pancreatic cancer Maternal Grandfather   . Hypertension  Paternal Grandmother   . Heart disease Paternal Grandfather   . Hypertension Paternal Grandfather     Social History Social History   Tobacco Use  . Smoking status: Never Smoker  . Smokeless tobacco: Never Used  Substance Use Topics  . Alcohol use: No  . Drug use: No     Allergies   Bee venom; Cinnamon; Doxycycline; Ibuprofen; Lexapro [escitalopram oxalate]; and Tape   Review of Systems Review of Systems  Ten systems reviewed and are negative for acute change, except as noted in the HPI.   Physical Exam Updated Vital Signs BP (!) 141/92   Pulse (!) 101   Temp 98.1 F (36.7 C) (Oral)   Resp 15   LMP 06/13/2017   SpO2 99%   Physical Exam  Constitutional: She is oriented to person, place, and time. She appears well-developed and well-nourished. No distress.  HENT:  Head: Normocephalic and atraumatic.  Eyes: Conjunctivae are normal. No scleral icterus.  Neck: Normal range of motion.  Cardiovascular: Normal rate, regular rhythm and normal heart sounds. Exam reveals no gallop and no friction rub.  No murmur heard. Pulmonary/Chest: Effort normal and breath sounds normal. No respiratory distress.  Abdominal: Soft. Bowel sounds are normal. She exhibits no distension and no mass. There is no tenderness. There is no guarding.  Neurological: She is alert and oriented to person, place, and time.  Skin: Skin is warm and dry. She is not diaphoretic.  Psychiatric: Her behavior is normal.  Nursing note and vitals reviewed.    ED Treatments /  Results  Labs (all labs ordered are listed, but only abnormal results are displayed) Labs Reviewed  CBC WITH DIFFERENTIAL/PLATELET  COMPREHENSIVE METABOLIC PANEL  I-STAT TROPONIN, ED  CBG MONITORING, ED    EKG  EKG Interpretation  Date/Time:  Saturday September 09 2017 22:08:49 EST Ventricular Rate:  98 PR Interval:    QRS Duration: 88 QT Interval:  348 QTC Calculation: 445 R Axis:   45 Text Interpretation:  Sinus rhythm no  acute st/ts similar pattern to prior 2/19 Confirmed by Aletta Edouard [id number]) on 09/09/2017 10:36:12 PM       Radiology Dg Chest 2 View  Result Date: 09/09/2017 CLINICAL DATA:  Right-sided chest pain EXAM: CHEST  2 VIEW COMPARISON:  09/05/2017, CT chest 09/05/2017 FINDINGS: Minimal atelectasis at the right base. No focal consolidation or effusion. Stable cardiomediastinal silhouette. No pneumothorax. IMPRESSION: No active cardiopulmonary disease. Electronically Signed   By: Donavan Foil M.D.   On: 09/09/2017 23:40    Procedures Procedures (including critical care time)  Medications Ordered in ED Medications  LORazepam (ATIVAN) tablet 1 mg (1 mg Oral Given 09/09/17 2338)     Initial Impression / Assessment and Plan / ED Course  I have reviewed the triage vital signs and the nursing notes.  Pertinent labs & imaging results that were available during my care of the patient were reviewed by me and considered in my medical decision making (see chart for details).    Pt HA treated and improved while in ED.not concerning for [facility], ICH, Meningitis, or temporal arteritis. Pt is afebrile with no focal neuro deficits, nuchal rigidity, or change in vision.CP is resolved. EKG/ labs and imaging reviewed. + hyperglycemia labs otherwise at baseline. Pt is to follow up with PCP to discuss prophylactic medication. Pt verbalizes understanding and is agreeable with plan to dc.     Final Clinical Impressions(s) / ED Diagnoses   Final diagnoses:  None    ED Discharge Orders    None       Margarita Mail, PA-C 09/10/17 0315    Hayden Rasmussen, MD 09/11/17 [phone]

## 2017-09-09 NOTE — ED Notes (Signed)
Pt requested to not have to wear a gown.

## 2017-09-09 NOTE — ED Triage Notes (Signed)
Pt is c/o chest pain that radiates to the head. Adds that she was recently hospitalized for an asthma excerebration.

## 2017-09-09 NOTE — ED Notes (Signed)
Pt refuses IV stick without 1mg  of ativan and waiting 20 mins. She also demands ultrasound.

## 2017-09-10 LAB — I-STAT TROPONIN, ED: TROPONIN I, POC: 0 ng/mL (ref 0.00–0.08)

## 2017-09-10 LAB — CBC WITH DIFFERENTIAL/PLATELET
BASOS ABS: 0 10*3/uL (ref 0.0–0.1)
BASOS PCT: 0 %
EOS ABS: 0 10*3/uL (ref 0.0–0.7)
Eosinophils Relative: 0 %
HEMATOCRIT: 37.2 % (ref 36.0–46.0)
Hemoglobin: 10.7 g/dL — ABNORMAL LOW (ref 12.0–15.0)
Lymphocytes Relative: 28 %
Lymphs Abs: 2.9 10*3/uL (ref 0.7–4.0)
MCH: 24.8 pg — ABNORMAL LOW (ref 26.0–34.0)
MCHC: 28.8 g/dL — AB (ref 30.0–36.0)
MCV: 86.3 fL (ref 78.0–100.0)
MONO ABS: 0.6 10*3/uL (ref 0.1–1.0)
MONOS PCT: 5 %
NEUTROS ABS: 7.1 10*3/uL (ref 1.7–7.7)
NEUTROS PCT: 67 %
Platelets: 353 10*3/uL (ref 150–400)
RBC: 4.31 MIL/uL (ref 3.87–5.11)
RDW: 16.2 % — AB (ref 11.5–15.5)
WBC: 10.6 10*3/uL — ABNORMAL HIGH (ref 4.0–10.5)

## 2017-09-10 LAB — COMPREHENSIVE METABOLIC PANEL
ALBUMIN: 3.2 g/dL — AB (ref 3.5–5.0)
ALT: 78 U/L — ABNORMAL HIGH (ref 14–54)
ANION GAP: 11 (ref 5–15)
AST: 50 U/L — AB (ref 15–41)
Alkaline Phosphatase: 62 U/L (ref 38–126)
BILIRUBIN TOTAL: 0.6 mg/dL (ref 0.3–1.2)
BUN: 23 mg/dL — AB (ref 6–20)
CO2: 27 mmol/L (ref 22–32)
Calcium: 8.9 mg/dL (ref 8.9–10.3)
Chloride: 101 mmol/L (ref 101–111)
Creatinine, Ser: 0.96 mg/dL (ref 0.44–1.00)
GFR calc Af Amer: >60 mL/min (ref >60)
GFR calc non Af Amer: >60 mL/min (ref >60)
GLUCOSE: 243 mg/dL — AB (ref 65–99)
POTASSIUM: 4 mmol/L (ref 3.5–5.1)
SODIUM: 139 mmol/L (ref 135–145)
Total Protein: 7.4 g/dL (ref 6.5–8.1)

## 2017-09-10 LAB — CULTURE, RESPIRATORY W GRAM STAIN

## 2017-09-10 LAB — CULTURE, RESPIRATORY: CULTURE: NORMAL

## 2017-09-10 MED ORDER — PROCHLORPERAZINE EDISYLATE 5 MG/ML IJ SOLN
10.0000 mg | Freq: Once | INTRAMUSCULAR | Status: AC
  Administered 2017-09-10: 10 mg via INTRAVENOUS
  Filled 2017-09-10: qty 2

## 2017-09-10 MED ORDER — DIPHENHYDRAMINE HCL 50 MG/ML IJ SOLN
25.0000 mg | Freq: Once | INTRAMUSCULAR | Status: AC
  Administered 2017-09-10: 25 mg via INTRAVENOUS
  Filled 2017-09-10: qty 1

## 2017-09-10 MED ORDER — KETOROLAC TROMETHAMINE 30 MG/ML IJ SOLN
30.0000 mg | Freq: Once | INTRAMUSCULAR | Status: AC
  Administered 2017-09-10: 30 mg via INTRAVENOUS
  Filled 2017-09-10: qty 1

## 2017-09-10 MED ORDER — SODIUM CHLORIDE 0.9 % IV BOLUS (SEPSIS)
500.0000 mL | Freq: Once | INTRAVENOUS | Status: AC
  Administered 2017-09-10: 500 mL via INTRAVENOUS

## 2017-09-10 NOTE — Discharge Instructions (Signed)
You are having a headache. No specific cause was found today for your headache. It may have been a migraine or other cause of headache. Stress, anxiety, fatigue, and depression are common triggers for headaches. Your headache today does not appear to be life-threatening or require hospitalization, but often the exact cause of headaches is not determined in the emergency department. Therefore, follow-up with your doctor is very important to find out what may have caused your headache, and whether or not you need any further diagnostic testing or treatment. Sometimes headaches can appear benign (not harmful), but then more serious symptoms can develop which should prompt an immediate re-evaluation by your doctor or the emergency department. °SEEK MEDICAL ATTENTION IF: °You develop possible problems with medications prescribed.  °The medications don't resolve your headache, if it recurs , or if you have multiple episodes of vomiting or can't take fluids. °You have a change from the usual headache. °RETURN IMMEDIATELY IF you develop a sudden, severe headache or confusion, become poorly responsive or faint, develop a fever above 100.4F or problem breathing, have a change in speech, vision, swallowing, or understanding, or develop new weakness, numbness, tingling, incoordination, or have a seizure. °Your caregiver has diagnosed you as having chest pain that is not specific for one problem, but does not require admission.  You are at low risk for an acute heart condition or other serious illness. Chest pain comes from many different causes.  °SEEK IMMEDIATE MEDICAL ATTENTION IF: °You have severe chest pain, especially if the pain is crushing or pressure-like and spreads to the arms, back, neck, or jaw, or if you have sweating, nausea (feeling sick to your stomach), or shortness of breath. THIS IS AN EMERGENCY. Don't wait to see if the pain will go away. Get medical help at once. Call 911 or 0 (operator). DO NOT drive  yourself to the hospital.  °Your chest pain gets worse and does not go away with rest.  °You have an attack of chest pain lasting longer than usual, despite rest and treatment with the medications your caregiver has prescribed.  °You wake from sleep with chest pain or shortness of breath.  °You feel dizzy or faint.  °You have chest pain not typical of your usual pain for which you originally saw your caregiver. ° °

## 2017-09-11 NOTE — Telephone Encounter (Signed)
Tried contacting pt to inform her that Dr. Laural Benes will not write rx was unable to lvm

## 2017-09-12 ENCOUNTER — Ambulatory Visit: Payer: Self-pay | Admitting: Internal Medicine

## 2017-09-12 ENCOUNTER — Other Ambulatory Visit: Payer: Self-pay | Admitting: Internal Medicine

## 2017-09-12 DIAGNOSIS — M25561 Pain in right knee: Principal | ICD-10-CM

## 2017-09-12 DIAGNOSIS — G8929 Other chronic pain: Secondary | ICD-10-CM

## 2017-09-12 DIAGNOSIS — M25572 Pain in left ankle and joints of left foot: Secondary | ICD-10-CM

## 2017-09-15 ENCOUNTER — Ambulatory Visit: Payer: Self-pay | Attending: Internal Medicine | Admitting: Internal Medicine

## 2017-09-15 ENCOUNTER — Encounter: Payer: Self-pay | Admitting: Internal Medicine

## 2017-09-15 VITALS — BP 142/93 | HR 106 | Temp 98.1°F | Resp 16 | Ht 64.0 in | Wt >= 6400 oz

## 2017-09-15 DIAGNOSIS — Z886 Allergy status to analgesic agent status: Secondary | ICD-10-CM | POA: Insufficient documentation

## 2017-09-15 DIAGNOSIS — Z91030 Bee allergy status: Secondary | ICD-10-CM | POA: Insufficient documentation

## 2017-09-15 DIAGNOSIS — F329 Major depressive disorder, single episode, unspecified: Secondary | ICD-10-CM | POA: Insufficient documentation

## 2017-09-15 DIAGNOSIS — F419 Anxiety disorder, unspecified: Secondary | ICD-10-CM | POA: Insufficient documentation

## 2017-09-15 DIAGNOSIS — Z79891 Long term (current) use of opiate analgesic: Secondary | ICD-10-CM | POA: Insufficient documentation

## 2017-09-15 DIAGNOSIS — R197 Diarrhea, unspecified: Secondary | ICD-10-CM | POA: Insufficient documentation

## 2017-09-15 DIAGNOSIS — Z91048 Other nonmedicinal substance allergy status: Secondary | ICD-10-CM | POA: Insufficient documentation

## 2017-09-15 DIAGNOSIS — Z7952 Long term (current) use of systemic steroids: Secondary | ICD-10-CM | POA: Insufficient documentation

## 2017-09-15 DIAGNOSIS — K2281 Esophageal polyp: Secondary | ICD-10-CM

## 2017-09-15 DIAGNOSIS — Z8249 Family history of ischemic heart disease and other diseases of the circulatory system: Secondary | ICD-10-CM | POA: Insufficient documentation

## 2017-09-15 DIAGNOSIS — Z79899 Other long term (current) drug therapy: Secondary | ICD-10-CM | POA: Insufficient documentation

## 2017-09-15 DIAGNOSIS — Z794 Long term (current) use of insulin: Secondary | ICD-10-CM | POA: Insufficient documentation

## 2017-09-15 DIAGNOSIS — Z09 Encounter for follow-up examination after completed treatment for conditions other than malignant neoplasm: Secondary | ICD-10-CM | POA: Insufficient documentation

## 2017-09-15 DIAGNOSIS — Z888 Allergy status to other drugs, medicaments and biological substances status: Secondary | ICD-10-CM | POA: Insufficient documentation

## 2017-09-15 DIAGNOSIS — I10 Essential (primary) hypertension: Secondary | ICD-10-CM

## 2017-09-15 DIAGNOSIS — G8929 Other chronic pain: Secondary | ICD-10-CM | POA: Insufficient documentation

## 2017-09-15 DIAGNOSIS — K219 Gastro-esophageal reflux disease without esophagitis: Secondary | ICD-10-CM | POA: Insufficient documentation

## 2017-09-15 DIAGNOSIS — Z9889 Other specified postprocedural states: Secondary | ICD-10-CM | POA: Insufficient documentation

## 2017-09-15 DIAGNOSIS — M25572 Pain in left ankle and joints of left foot: Secondary | ICD-10-CM | POA: Insufficient documentation

## 2017-09-15 DIAGNOSIS — K228 Other specified diseases of esophagus: Secondary | ICD-10-CM

## 2017-09-15 DIAGNOSIS — E119 Type 2 diabetes mellitus without complications: Secondary | ICD-10-CM

## 2017-09-15 DIAGNOSIS — Z881 Allergy status to other antibiotic agents status: Secondary | ICD-10-CM | POA: Insufficient documentation

## 2017-09-15 DIAGNOSIS — Z800 Family history of malignant neoplasm of digestive organs: Secondary | ICD-10-CM | POA: Insufficient documentation

## 2017-09-15 DIAGNOSIS — K760 Fatty (change of) liver, not elsewhere classified: Secondary | ICD-10-CM

## 2017-09-15 DIAGNOSIS — Z791 Long term (current) use of non-steroidal anti-inflammatories (NSAID): Secondary | ICD-10-CM | POA: Insufficient documentation

## 2017-09-15 DIAGNOSIS — Z833 Family history of diabetes mellitus: Secondary | ICD-10-CM | POA: Insufficient documentation

## 2017-09-15 LAB — GLUCOSE, POCT (MANUAL RESULT ENTRY): POC GLUCOSE: 315 mg/dL — AB (ref 70–99)

## 2017-09-15 MED ORDER — INSULIN GLARGINE 100 UNIT/ML ~~LOC~~ SOLN: 12.0000 [IU] | Freq: Every day | SUBCUTANEOUS | 0 refills | Status: DC

## 2017-09-15 MED ORDER — METFORMIN HCL ER 500 MG PO TB24: 500.0000 mg | ORAL_TABLET | Freq: Every day | ORAL | 6 refills | Status: DC

## 2017-09-15 MED ORDER — HYDROCHLOROTHIAZIDE 12.5 MG PO CAPS: 12.5000 mg | ORAL_CAPSULE | Freq: Every day | ORAL | 1 refills | Status: DC

## 2017-09-15 MED FILL — GABAPENTIN 300 MG CAPSULE: 300 | 30 days supply | Qty: 120 | Fill #4

## 2017-09-15 MED FILL — ?CETIRIZINE HCL 10 MG TABLE: 10 | 30 days supply | Qty: 30 | Fill #9

## 2017-09-15 MED FILL — ?DULOXETINE HCL 30 MG CPEP: 30 | 30 days supply | Qty: 60 | Fill #0

## 2017-09-15 MED FILL — PROMETHAZINE 25 MG TABLET: 25 | 30 days supply | Qty: 90 | Fill #7

## 2017-09-15 MED FILL — ?DICLOFENAC SOD DR 75 MG TA: 75 | 30 days supply | Qty: 60 | Fill #2

## 2017-09-15 MED FILL — ?MONTELUKAST SOD 10 MG TAB: 10 | 30 days supply | Qty: 30 | Fill #1

## 2017-09-15 MED FILL — ?TIZANIDINE HCL 4MG TABLETS: 4 | 15 days supply | Qty: 60 | Fill #0

## 2017-09-15 MED FILL — ?HYDROCHLOROTHIAZIDE 12.5 M: 12.5 | 30 days supply | Qty: 30 | Fill #0

## 2017-09-15 MED FILL — METFORMIN HCL ER 500 MG TAB: 500 | 30 days supply | Qty: 30 | Fill #0

## 2017-09-15 NOTE — Progress Notes (Signed)
Patient ID: Courtney Zamora, female    DOB: 09-03-1985  MRN: [id number]  CC: Hospitalization Follow-up   Subjective: Courtney Zamora is a 32 y.o. female who presents for hospital follow-up.  Husband is with her. Her concerns today include:  Patient with history of moderate persistent asthma, morbid obesity, hypertension, depression/anxiety, chronic knee pain due to on meniscus tearand osteoarthritis based on MRI done 04/2016, on pain management agreement for Tylenol #3.  Since last visit patient hospitalized 219-22/2019 with CAP, asthma exacerbation and new diagnosis of diabetes.  She was noted to have bilateral groundglass opacities in the lung parenchyma.  Plan is to have her follow-up with pulmonary in outpatient as she has been doing.  Appointment scheduled for early April.  Diabetes: Continued steroid use over the past 6 months likely contributed.  A1c was in the 9.  Patient on metformin, Lantus 10 units and NovoLog sliding scale.  On average she has been taking 7-10 units of NovoLog.  Reports some stomach cramps, nausea and diarrhea with metformin.  Checking blood sugars 2-3 times a day.  Morning range has been 141-220.  She met with nutritionist briefly while in the hospital.  She has done a lot of reading online about diabetic diet. -Her blood pressure medication HCTZ was discontinued while in the hospital.  She is currently not on anything for blood pressure.  Patient also found to have esophageal polyps (CT done 08/10/2017) and severe hepatic steatosis noted on CT angios of the chest.  Liver enzymes elevated.  Hepatitis B core IgM positive but hepatitis B surface antigen negative.  Patient Active Problem List   Diagnosis Date Noted  . DM2 (diabetes mellitus, type 2) ([place]) 09/06/2017  . SOB (shortness of breath) 09/06/2017  . CAP (community acquired pneumonia) 08/03/2017  . Acute frontal sinusitis 12/15/2016  . Metatarsal stress fracture, right, sequela 11/17/2016  . Neck muscle  spasm 10/05/2016  . Nipple discharge in female 09/13/2016  . Depression 08/17/2016  . Anxiety 08/17/2016  . ASCUS with positive high risk HPV cervical 08/14/2016  . Hypertension 08/14/2016  . Long term (current) use of systemic steroids 08/14/2016  . Esophageal reflux 12/29/2015  . Morbid (severe) obesity due to excess calories ([place]) 12/29/2015  . Chronic pain of right knee 12/29/2015  . Chronic pain of left ankle 12/29/2015  . 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 Asthma, moderate persistent 12/09/2015     Current Outpatient Medications on File Prior to Visit  Medication Sig Dispense Refill  . acetaminophen-codeine (TYLENOL #3) 300-30 MG tablet Take 1 tablet by mouth every 4 (four) hours as needed for moderate pain.    Marland Kitchen albuterol (PROVENTIL HFA;VENTOLIN HFA) 108 (90 Base) MCG/ACT inhaler Inhale 2 puffs into the lungs every 6 (six) hours as needed for wheezing or shortness of breath (wheezing). For shortness of breath. 54 Inhaler 0  . albuterol (PROVENTIL) (2.5 MG/3ML) 0.083% nebulizer solution Take 3 mLs (2.5 mg total) by nebulization every 6 (six) hours as needed for wheezing or shortness of breath. 150 mL 1  . blood glucose meter kit and supplies KIT Dispense based on patient and insurance preference. Use up to four times daily as directed. (FOR ICD-9 250.00, 250.01). 1 each 0  . budesonide-formoterol (SYMBICORT) 160-4.5 MCG/ACT inhaler Inhale 2 puffs into the lungs 2 (two) times daily. 3 Inhaler 3  . Calcium Citrate 200 MG TABS Take 2 tablets (400 mg total) by mouth every other day. (Patient taking differently: Take 400 mg by mouth at bedtime. )    .  cetirizine (ZYRTEC) 10 MG tablet Take 1 tablet (10 mg total) by mouth daily. 30 tablet 11  . Cholecalciferol (VITAMIN D3) 5000 units CAPS Take 5,000 Units by mouth daily.     . diclofenac (VOLTAREN) 75 MG EC tablet Take 1 tablet (75 mg total) by mouth 2 (two) times daily as needed. (Patient taking differently: Take 75 mg by mouth 2 (two) times  daily as needed for mild pain. ) 60 tablet 2  . diclofenac sodium (VOLTAREN) 1 % GEL Apply 2 g topically 4 (four) times daily. (Patient taking differently: Apply 2 g topically 4 (four) times daily as needed (pain). ) 3 Tube 3  . DULoxetine (CYMBALTA) 30 MG capsule Take 1 capsule (30 mg total) by mouth 2 (two) times daily. 60 capsule 6  . fluticasone (FLONASE) 50 MCG/ACT nasal spray Place 2 sprays into both nostrils 2 (two) times daily. (Patient not taking: Reported on 09/06/2017) 16 g 6  . furosemide (LASIX) 20 MG tablet TAKE 1 TABLET (20 MG TOTAL) BY MOUTH DAILY AS NEEDED. (Patient taking differently: Take 20 mg by mouth daily as needed for fluid. ) 12 tablet 2  . gabapentin (NEURONTIN) 300 MG capsule 1 tab PO Qa.m and noon and 2 tabs Q p.m (Patient taking differently: Take 300-600 mg by mouth 2 (two) times daily. 1 tab PO Qa.m and noon and 2 tabs Q p.m) 120 capsule 4  . guaiFENesin (MUCINEX) 600 MG 12 hr tablet Take 1 tablet (600 mg total) by mouth 2 (two) times daily. (Patient not taking: Reported on 09/15/2017) 60 tablet 0  . hydrOXYzine (ATARAX/VISTARIL) 10 MG tablet Take 1 tablet (10 mg total) by mouth 3 (three) times daily as needed for anxiety. 30 tablet 0  . insulin aspart (NOVOLOG) 100 UNIT/ML injection Insulin sliding scale: Blood sugar  120-150   3units                       151-200   4units                       201-250   7units                       251- 300  11units                       301-350   15uints                       351-400   20units                       >400         call MD immediately 10 mL 0  . ipratropium-albuterol (DUONEB) 0.5-2.5 (3) MG/3ML SOLN Take 3 mLs by nebulization every 6 (six) hours as needed. (Patient taking differently: Take 3 mLs by nebulization every 6 (six) hours as needed. Wheezing) 360 mL 5  . montelukast (SINGULAIR) 10 MG tablet Take 1 tablet (10 mg total) by mouth daily. 30 tablet 5  . Multiple Vitamins-Minerals (WOMENS MULTI) CAPS Take 1 capsule by  mouth daily.    . pantoprazole (PROTONIX) 40 MG tablet Take 40 mg by mouth daily.    . predniSONE (DELTASONE) 10 MG tablet Take 41ms (3tabs) daily for three days, then 260m(2tabs) daily for two days, then 104maily for two days , then stop. (Patient not  taking: Reported on 09/15/2017) 15 tablet 0  . promethazine (PHENERGAN) 25 MG tablet Take 1 tablet (25 mg total) by mouth every 8 (eight) hours as needed for nausea or vomiting. (Patient not taking: Reported on 09/10/2017) 2060 tablet 2  . Respiratory Therapy Supplies (FLUTTER) DEVI As directed. 1 each 0  . Spacer/Aero-Holding Chambers (AEROCHAMBER MV) inhaler Use as instructed 1 each 0  . tiZANidine (ZANAFLEX) 4 MG tablet TAKE 1 TABLET BY MOUTH EVERY 6 HOURS AS NEEDED FOR MUSCLE SPASMS. 60 tablet 2   No current facility-administered medications on file prior to visit.     Allergies  Allergen Reactions  . Bee Venom Anaphylaxis  . Cinnamon Anaphylaxis  . Doxycycline Hives  . Ibuprofen Other (See Comments)    Abdominal pain  . Lexapro [Escitalopram Oxalate]     Generalized body shaking. ? sz  . Tape Hives and Rash    EKG STICKERS give pt rash, hives    Social History   Socioeconomic History  . Marital status: Married    Spouse name: Not on file  . Number of children: Not on file  . Years of education: Not on file  . Highest education level: Not on file  Social Needs  . Financial resource strain: Not on file  . Food insecurity - worry: Not on file  . Food insecurity - inability: Not on file  . Transportation needs - medical: Not on file  . Transportation needs - non-medical: Not on file  Occupational History  . Not on file  Tobacco Use  . Smoking status: Never Smoker  . Smokeless tobacco: Never Used  Substance and Sexual Activity  . Alcohol use: No  . Drug use: No  . Sexual activity: Yes    Birth control/protection: None  Other Topics Concern  . Not on file  Social History Narrative  . Not on file    Family History    Problem Relation Age of Onset  . Hypothyroidism Mother   . Diabetes Father   . Diabetes Paternal Uncle   . Hypertension Maternal Grandmother   . Hypertension Maternal Grandfather   . Pancreatic cancer Maternal Grandfather   . Hypertension Paternal Grandmother   . Heart disease Paternal Grandfather   . Hypertension Paternal Grandfather     Past Surgical History:  Procedure Laterality Date  . TONSILLECTOMY      ROS: Review of Systems Negative except as stated above PHYSICAL EXAM: BP (!) 142/93   Pulse (!) 106   Temp 98.1 F (36.7 C) (Oral)   Resp 16   Ht _0  (1.626 m)   Wt (!) 400 lb (181.4 kg)   SpO2 100%   BMI 68.66 kg/m   Physical Exam  General appearance - alert, well appearing, and in no distress Mental status - alert, oriented to person, place, and time, normal mood, behavior, speech, dress, motor activity, and thought processes Chest - clear to auscultation, no wheezes, rales or rhonchi, symmetric air entry Heart - normal rate, regular rhythm, normal S1, S2, no murmurs, rubs, clicks or gallops Extremities - peripheral pulses normal, no pedal edema, no clubbing or cyanosis Diabetic Foot Exam - Simple   Simple Foot Form Visual Inspection No deformities, no ulcerations, no other skin breakdown bilaterally:  Yes Sensation Testing Intact to touch and monofilament testing bilaterally:  Yes Pulse Check Posterior Tibialis and Dorsalis pulse intact bilaterally:  Yes Comments       Chemistry      Component Value Date/Time   NA  139 09/10/2017 0042   K 4.0 09/10/2017 0042   CL 101 09/10/2017 0042   CO2 27 09/10/2017 0042   BUN 23 (H) 09/10/2017 0042   CREATININE 0.96 09/10/2017 0042      Component Value Date/Time   CALCIUM 8.9 09/10/2017 0042   ALKPHOS 62 09/10/2017 0042   AST 50 (H) 09/10/2017 0042   ALT 78 (H) 09/10/2017 0042   BILITOT 0.6 09/10/2017 0042     Lab Results  Component Value Date   WBC 10.6 (H) 09/10/2017   HGB 10.7 (L) 09/10/2017    HCT 37.2 09/10/2017   MCV 86.3 09/10/2017   PLT 353 09/10/2017     ASSESSMENT AND PLAN: 1. Newly diagnosed diabetes ([place]) Continue Lantus.  Increase to 12 units daily.  Continue to check blood sugars.  If morning blood sugars remain greater than 130 after 3 days patient advised to increase Lantus to 14 units.  Change metformin to Extended release form to see if she tolerates it better. Encourage good eating habits.  We will see her back in 6 weeks Encouraged to stay off of prednisone unless absolutely needed for her lungs. - POCT glucose (manual entry) - Microalbumin / creatinine urine ratio - metFORMIN (GLUCOPHAGE-XR) 500 MG 24 hr tablet; Take 1 tablet (500 mg total) by mouth daily with breakfast.  Dispense: 60 tablet; Refill: 6 - insulin glargine (LANTUS) 100 UNIT/ML injection; Inject 0.12 mLs (12 Units total) into the skin at bedtime.  Dispense: 10 mL; Refill: 0 - Ambulatory referral to Ophthalmology  2. Hypertension, unspecified type Restart HCTZ - hydrochlorothiazide (MICROZIDE) 12.5 MG capsule; Take 1 capsule (12.5 mg total) by mouth daily.  Dispense: 90 capsule; Refill: 1  3.  Severe hepatic steatosis -Encourage weight loss and good diabetes control -She will keep appointment with GI next week. -Results of hepatitis B serology was a bit confusing.  Hep B antigen was negative but hepatitis C core IgM positive.  Patient declines doing further blood work today.  She will speak with GI about it first  4. Esophageal polyp Keep appointment with GI  Patient was given the opportunity to ask questions.  Patient verbalized understanding of the plan and was able to repeat key elements of the plan.   Orders Placed This Encounter  Procedures  . Microalbumin / creatinine urine ratio  . Ambulatory referral to Ophthalmology  . POCT glucose (manual entry)     Requested Prescriptions   Signed Prescriptions Disp Refills  . hydrochlorothiazide (MICROZIDE) 12.5 MG capsule 90 capsule 1     Sig: Take 1 capsule (12.5 mg total) by mouth daily.  . metFORMIN (GLUCOPHAGE-XR) 500 MG 24 hr tablet 60 tablet 6    Sig: Take 1 tablet (500 mg total) by mouth daily with breakfast.  . insulin glargine (LANTUS) 100 UNIT/ML injection 10 mL 0    Sig: Inject 0.12 mLs (12 Units total) into the skin at bedtime.    Return in about 6 weeks (around 10/27/2017).  Karle Plumber, MD, FACP

## 2017-09-15 NOTE — Patient Instructions (Addendum)
Increase Lantus to 12 units daily.  Continue to check Blood sugars.  If after 3 days, your morning blood sugars are still greater than 130, increase lantus to 14 units.  Change Metformin to extended release form.    Ask gastroenterologist about the hepatitis B.  Follow a Healthy Eating Plan - You can do it! Limit sugary drinks.  Avoid sodas, sweet tea, sport or energy drinks, or fruit drinks.  Drink water, lo-fat milk, or diet drinks. Limit snack foods.   Cut back on candy, cake, cookies, chips, ice cream.  These are a special treat, only in small amounts. Eat plenty of vegetables.  Especially dark green, red, and orange vegetables. Aim for at least 3 servings a day. More is better! Include fruit in your daily diet.  Whole fruit is much healthier than fruit juice! Limit "white" bread, "white" pasta, "white" rice.   Choose "100% whole grain" products, brown or wild rice. Avoid fatty meats. Try "Meatless Monday" and choose eggs or beans one day a week.  When eating meat, choose lean meats like chicken, [place], and fish.  Grill, broil, or bake meats instead of frying, and eat poultry without the skin. Eat less salt.  Avoid frozen pizzas, frozen dinners and salty foods.  Use seasonings other than salt in cooking.  This can help blood pressure and keep you from swelling Beer, wine and liquor have calories.  If you can safely drink alcohol, limit to 1 drink per day for women, 2 drinks for men

## 2017-09-15 NOTE — Progress Notes (Signed)
Pt is having pain in her left ankle

## 2017-09-19 ENCOUNTER — Ambulatory Visit (INDEPENDENT_AMBULATORY_CARE_PROVIDER_SITE_OTHER): Payer: No Typology Code available for payment source | Admitting: Gastroenterology

## 2017-09-19 ENCOUNTER — Encounter: Payer: Self-pay | Admitting: Gastroenterology

## 2017-09-19 VITALS — BP 122/84 | HR 95 | Ht 64.0 in | Wt >= 6400 oz

## 2017-09-19 DIAGNOSIS — R7989 Other specified abnormal findings of blood chemistry: Secondary | ICD-10-CM

## 2017-09-19 DIAGNOSIS — R945 Abnormal results of liver function studies: Secondary | ICD-10-CM

## 2017-09-19 DIAGNOSIS — R933 Abnormal findings on diagnostic imaging of other parts of digestive tract: Secondary | ICD-10-CM

## 2017-09-19 DIAGNOSIS — R1310 Dysphagia, unspecified: Secondary | ICD-10-CM

## 2017-09-19 MED ORDER — LORAZEPAM 1 MG PO TABS: 1.0000 mg | ORAL_TABLET | ORAL | 0 refills | Status: DC | PRN

## 2017-09-19 MED ORDER — PANTOPRAZOLE SODIUM 40 MG PO TBEC: 40.0000 mg | DELAYED_RELEASE_TABLET | Freq: Two times a day (BID) | ORAL | 3 refills | Status: DC

## 2017-09-19 MED FILL — ?PANTOPRAZOLE SOD DR 40MG: 40 MG | 30 days supply | Qty: 60 | Fill #0

## 2017-09-19 NOTE — Progress Notes (Signed)
09/19/2017 Courtney Zamora [id number] Dec 06, 1985   HISTORY OF PRESENT ILLNESS:  This is a 32 year old female who is new to our office.  Referred here by her PCP, Dr. Wynetta Emery.  She presents to our office today with a couple of different issues.  First, she complains of heartburn/reflux related issues.  She tells me that she's had long-standing issues with acid reflux and has been on omeprazole, pepcid, and zantac in the past without any improvement in her symptoms.  Most recently she has been on pantoprazole 40 mg daily and the seems to help to some degree but she still has symptoms.  She also complains of some dysphagia about every other day for the past year.  Medication also gets stuck at times and sometimes hurts when food goes down.  She had a CT scan of the chest without contrast for complaints of cough back in January and that showed multiple nodular appearing soft tissue densities in the distal esophagus concerning for multiple small polyps.  She also had slightly elevated transaminases with AST 50 and ALT 78.  Has severe hepatic steatosis by imaging as well but acute hepatitis panel was checked and she was found to have a positive Hep B C IgM but surface Ag was negative.  Denies tattoos, drug use, high risk sexual behavior, blood transfusion.  Says that she was vaccinated twice in the past.  Is newly diagnosed diabetic on insulin with blood sugars ranging between 152 and 315.  Seizures are well controlled.  Has severe needle phobia.   Past Medical History:  Diagnosis Date  . Anxiety   . Asthma   . Depression   . Essential hypertension   . GERD (gastroesophageal reflux disease)   . Obesity   . Osteoarthritis, knee   . Seizures ([place])   . Severe needle phobia    Past Surgical History:  Procedure Laterality Date  . TONSILLECTOMY      reports that  has never smoked. she has never used smokeless tobacco. She reports that she does not drink alcohol or use drugs. family history  includes Diabetes in her father and paternal uncle; Heart disease in her paternal grandfather; Hypertension in her maternal grandfather, maternal grandmother, paternal grandfather, and paternal grandmother; Hypothyroidism in her mother; Pancreatic cancer in her maternal grandfather. Allergies  Allergen Reactions  . Bee Venom Anaphylaxis  . Cinnamon Anaphylaxis  . Doxycycline Hives  . Ibuprofen Other (See Comments)    Abdominal pain  . Lexapro [Escitalopram Oxalate]     Generalized body shaking. ? sz  . Tape Hives and Rash    EKG STICKERS give pt rash, hives      Outpatient Encounter Medications as of 09/19/2017  Medication Sig  . acetaminophen-codeine (TYLENOL #3) 300-30 MG tablet Take 1 tablet by mouth every 4 (four) hours as needed for moderate pain.  Marland Kitchen albuterol (PROVENTIL HFA;VENTOLIN HFA) 108 (90 Base) MCG/ACT inhaler Inhale 2 puffs into the lungs every 6 (six) hours as needed for wheezing or shortness of breath (wheezing). For shortness of breath.  Marland Kitchen albuterol (PROVENTIL) (2.5 MG/3ML) 0.083% nebulizer solution Take 3 mLs (2.5 mg total) by nebulization every 6 (six) hours as needed for wheezing or shortness of breath.  . blood glucose meter kit and supplies KIT Dispense based on patient and insurance preference. Use up to four times daily as directed. (FOR ICD-9 250.00, 250.01).  . budesonide-formoterol (SYMBICORT) 160-4.5 MCG/ACT inhaler Inhale 2 puffs into the lungs 2 (two) times daily.  Marland Kitchen  Calcium Citrate 200 MG TABS Take 2 tablets (400 mg total) by mouth every other day. (Patient taking differently: Take 400 mg by mouth at bedtime. )  . cetirizine (ZYRTEC) 10 MG tablet Take 1 tablet (10 mg total) by mouth daily.  . Cholecalciferol (VITAMIN D3) 5000 units CAPS Take 5,000 Units by mouth daily.   . diclofenac (VOLTAREN) 75 MG EC tablet Take 1 tablet (75 mg total) by mouth 2 (two) times daily as needed. (Patient taking differently: Take 75 mg by mouth 2 (two) times daily as needed for mild  pain. )  . diclofenac sodium (VOLTAREN) 1 % GEL Apply 2 g topically 4 (four) times daily. (Patient taking differently: Apply 2 g topically 4 (four) times daily as needed (pain). )  . DULoxetine (CYMBALTA) 30 MG capsule Take 1 capsule (30 mg total) by mouth 2 (two) times daily.  . fluticasone (FLONASE) 50 MCG/ACT nasal spray Place 2 sprays into both nostrils 2 (two) times daily.  . furosemide (LASIX) 20 MG tablet TAKE 1 TABLET (20 MG TOTAL) BY MOUTH DAILY AS NEEDED. (Patient taking differently: Take 20 mg by mouth daily as needed for fluid. )  . gabapentin (NEURONTIN) 300 MG capsule 1 tab PO Qa.m and noon and 2 tabs Q p.m (Patient taking differently: Take 300-600 mg by mouth 2 (two) times daily. 1 tab PO Qa.m and noon and 2 tabs Q p.m)  . guaiFENesin (MUCINEX) 600 MG 12 hr tablet Take 1 tablet (600 mg total) by mouth 2 (two) times daily.  . hydrochlorothiazide (MICROZIDE) 12.5 MG capsule Take 1 capsule (12.5 mg total) by mouth daily.  . hydrOXYzine (ATARAX/VISTARIL) 10 MG tablet Take 1 tablet (10 mg total) by mouth 3 (three) times daily as needed for anxiety.  . insulin aspart (NOVOLOG) 100 UNIT/ML injection Insulin sliding scale: Blood sugar  120-150   3units                       151-200   4units                       201-250   7units                       251- 300  11units                       301-350   15uints                       351-400   20units                       >400         call MD immediately  . insulin glargine (LANTUS) 100 UNIT/ML injection Inject 0.12 mLs (12 Units total) into the skin at bedtime.  Marland Kitchen ipratropium-albuterol (DUONEB) 0.5-2.5 (3) MG/3ML SOLN Take 3 mLs by nebulization every 6 (six) hours as needed. (Patient taking differently: Take 3 mLs by nebulization every 6 (six) hours as needed. Wheezing)  . metFORMIN (GLUCOPHAGE-XR) 500 MG 24 hr tablet Take 1 tablet (500 mg total) by mouth daily with breakfast.  . montelukast (SINGULAIR) 10 MG tablet Take 1 tablet (10 mg  total) by mouth daily.  . Multiple Vitamins-Minerals (WOMENS MULTI) CAPS Take 1 capsule by mouth daily.  . pantoprazole (PROTONIX) 40 MG tablet Take 40 mg by mouth daily.  Marland Kitchen  predniSONE (DELTASONE) 10 MG tablet Take '30mg'$ s (3tabs) daily for three days, then '20mg'$  (2tabs) daily for two days, then '10mg'$  daily for two days , then stop.  . promethazine (PHENERGAN) 25 MG tablet Take 1 tablet (25 mg total) by mouth every 8 (eight) hours as needed for nausea or vomiting.  Marland Kitchen Respiratory Therapy Supplies (FLUTTER) DEVI As directed.  Marland Kitchen Spacer/Aero-Holding Chambers (AEROCHAMBER MV) inhaler Use as instructed  . tiZANidine (ZANAFLEX) 4 MG tablet TAKE 1 TABLET BY MOUTH EVERY 6 HOURS AS NEEDED FOR MUSCLE SPASMS.   No facility-administered encounter medications on file as of 09/19/2017.      REVIEW OF SYSTEMS  : All other systems reviewed and negative except where noted in the History of Present Illness.   PHYSICAL EXAM: BP 122/84   Pulse 95   Ht '5\' 4"'$  (1.626 m)   Wt (!) 404 lb 8 oz (183.5 kg)   BMI 69.43 kg/m  General: Well developed white female in no acute distress; morbidly obese, uses a walker Head: Normocephalic and atraumatic Eyes:  Sclerae anicteric, conjunctiva pink. Ears: Normal auditory acuity Lungs: Some wheezing noted on the right.  No increased WOB. Heart: Regular rate and rhythm; no M/R/G. Abdomen: Soft, obese, non-distended.  BS present.  Non-tender. Musculoskeletal: Symmetrical with no gross deformities  Skin: No lesions on visible extremities Extremities: No edema  Neurological: Alert oriented x 4, grossly non-focal Psychological:  Alert and cooperative. Normal mood and affect  ASSESSMENT AND PLAN: *Abnormal CT scan of the esophagus in a patient with complaints of dysphagia and GERD:  Will schedule for EGD with possible dilation at [place] with Dr. Loletha Carrow due to her weight/BMI. *IDDM:  Insulin will be adjusted prior to endoscopic procedure per protocol. Will resume normal dosing  after procedure. *Abnormal labs with positive Hep B C IgM but negative surface Ag:  LFT's are elevated slightly but has severe hepatic steatosis as well.  Will recheck Hep B surface Ag now that it has been a few weeks and will also check a Hep B DNA.  **The risks, benefits, and alternatives to EGD with possible dilation were discussed with the patient and she consents to proceed.   CC:  Ladell Pier, MD

## 2017-09-19 NOTE — Progress Notes (Signed)
Thank you for sending this case to me. I have reviewed the entire note, and the outlined plan seems appropriate.   Imagene Boss Danis, MD  

## 2017-09-19 NOTE — Patient Instructions (Signed)
You have been scheduled for an endoscopy. Please follow written instructions given to you at your visit today. If you use inhalers (even only as needed), please bring them with you on the day of your procedure. Your physician has requested that you go to www.startemmi.com and enter the access code given to you at your visit today. This web site gives a general overview about your procedure. However, you should still follow specific instructions given to you by our office regarding your preparation for the procedure.  Your physician has requested that you go to the basement for lab work before leaving today.   

## 2017-09-20 DIAGNOSIS — G4733 Obstructive sleep apnea (adult) (pediatric): Secondary | ICD-10-CM

## 2017-09-21 ENCOUNTER — Telehealth: Payer: Self-pay | Admitting: Internal Medicine

## 2017-09-21 ENCOUNTER — Other Ambulatory Visit: Payer: Self-pay

## 2017-09-21 ENCOUNTER — Ambulatory Visit: Payer: Self-pay | Admitting: Internal Medicine

## 2017-09-21 ENCOUNTER — Other Ambulatory Visit: Payer: Self-pay | Admitting: *Deleted

## 2017-09-21 DIAGNOSIS — E119 Type 2 diabetes mellitus without complications: Secondary | ICD-10-CM

## 2017-09-21 DIAGNOSIS — J4541 Moderate persistent asthma with (acute) exacerbation: Secondary | ICD-10-CM

## 2017-09-21 MED ORDER — INSULIN GLARGINE 100 UNIT/ML ~~LOC~~ SOLN
16.0000 [IU] | Freq: Every day | SUBCUTANEOUS | 2 refills | Status: DC
Start: 2017-09-21 — End: 2017-10-02

## 2017-09-21 MED ORDER — INSULIN ASPART 100 UNIT/ML ~~LOC~~ SOLN: SUBCUTANEOUS | 2 refills | Status: DC

## 2017-09-21 MED ORDER — METFORMIN HCL ER 500 MG PO TB24
500.0000 mg | ORAL_TABLET | Freq: Two times a day (BID) | ORAL | 6 refills | Status: DC
Start: 2017-09-21 — End: 2017-10-02

## 2017-09-21 MED FILL — !LANTUS 100 UNITS/ML VIAL: 100 | 28 days supply | Qty: 10 | Fill #0

## 2017-09-21 MED FILL — !NOVOLOG 100UNITS/ML VIAL: 100/ML | 16 days supply | Qty: 10 | Fill #0

## 2017-09-21 NOTE — Telephone Encounter (Signed)
Spoke with Pt and pt stated she took novolog @ 215pm and she took her morning dose of metformin and her blood sugar is 360. Spoke with Dr. Laural Benes and she wanted to know what is pt taking for dm. Informed Dr. Laural Benes that she is on metformin once a day, novolog sliding scale and lantus 12 units at bedtime(but last appointment Dr. Laural Benes would like her to increase lantus if blood sugars remain greater than 130 after 3 days increase to 14 units) pt states today is her 3rd day doing 14 units.  Per Dr. Laural Benes she can take 5 additional units of novolog, increase to 16 units of lantus at bedtime and increase metformin bid. Informed pt of Dr. Laural Benes changes to medication and sent new rx's to [facility] pharmacy.

## 2017-09-21 NOTE — Telephone Encounter (Signed)
Patient called requesting to speak with pcp nurse. Patient stated her blood sugar has been high all day. Patient stated she had taken her insulin 20 mins ago due to her blood sugar being 360. Please fu.

## 2017-09-21 NOTE — Telephone Encounter (Signed)
I confirm that I gave the following orders: pt was to take an additional 5 units of Novolog now.  Then increase Lantus to 16 units daily and Metformin to 500 mg BID.

## 2017-09-22 ENCOUNTER — Encounter: Payer: Self-pay | Admitting: Internal Medicine

## 2017-09-26 DIAGNOSIS — G4733 Obstructive sleep apnea (adult) (pediatric): Secondary | ICD-10-CM

## 2017-09-27 NOTE — ED Provider Notes (Signed)
[facility]    CSN: [id number] Arrival date & time: 04/18/17  1921     History   Chief Complaint Chief Complaint  Patient presents with  . Wound Check    HPI Courtney Zamora is a 32 y.o. female.   Pt reports that wound on leg had been healing but reopened and is now oozing. Denies fevers, N/V/D.       Past Medical History:  Diagnosis Date  . Anxiety   . Asthma   . Depression   . Essential hypertension   . GERD (gastroesophageal reflux disease)   . Obesity   . Osteoarthritis, knee   . Seizures ([place])   . Severe needle phobia     Patient Active Problem List   Diagnosis Date Noted  . Abnormal CT scan, esophagus 09/19/2017  . Dysphagia 09/19/2017  . Elevated LFTs 09/19/2017  . Newly diagnosed diabetes ([place]) 09/15/2017  . Hepatic steatosis 09/15/2017  . DM2 (diabetes mellitus, type 2) ([place]) 09/06/2017  . SOB (shortness of breath) 09/06/2017  . CAP (community acquired pneumonia) 08/03/2017  . Acute frontal sinusitis 12/15/2016  . Metatarsal stress fracture, right, sequela 11/17/2016  . Neck muscle spasm 10/05/2016  . Nipple discharge in female 09/13/2016  . Depression 08/17/2016  . Anxiety 08/17/2016  . ASCUS with positive high risk HPV cervical 08/14/2016  . Hypertension 08/14/2016  . Long term (current) use of systemic steroids 08/14/2016  . Esophageal reflux 12/29/2015  . Morbid (severe) obesity due to excess calories ([place]) 12/29/2015  . Chronic pain of right knee 12/29/2015  . Chronic pain of left ankle 12/29/2015  . 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 Asthma, moderate persistent 12/09/2015    Past Surgical History:  Procedure Laterality Date  . TONSILLECTOMY      OB History    Gravida Para Term Preterm AB Living   1       1 0   SAB TAB Ectopic Multiple Live Births   1               Home Medications    Prior to Admission medications   Medication Sig Start Date End Date Taking? Authorizing Provider  acetaminophen-codeine  (TYLENOL #3) 300-30 MG tablet Take 1 tablet by mouth every 4 (four) hours as needed for moderate pain.    [provider]  albuterol (PROVENTIL HFA;VENTOLIN HFA) 108 (90 Base) MCG/ACT inhaler Inhale 2 puffs into the lungs every 6 (six) hours as needed for wheezing or shortness of breath (wheezing). For shortness of breath. 04/06/17   Ladell Pier, MD  albuterol (PROVENTIL) (2.5 MG/3ML) 0.083% nebulizer solution Take 3 mLs (2.5 mg total) by nebulization every 6 (six) hours as needed for wheezing or shortness of breath. 11/17/16   Boykin Nearing, MD  blood glucose meter kit and supplies KIT Dispense based on patient and insurance preference. Use up to four times daily as directed. (FOR ICD-9 250.00, 250.01). 09/08/17   Florencia Reasons, MD  budesonide-formoterol [facility]) 160-4.5 MCG/ACT inhaler Inhale 2 puffs into the lungs 2 (two) times daily. 03/18/16   Funches, Adriana Mccallum, MD  Calcium Citrate 200 MG TABS Take 2 tablets (400 mg total) by mouth every other day. Patient taking differently: Take 400 mg by mouth at bedtime.  10/05/16   Rowe Clack, MD  cetirizine (ZYRTEC) 10 MG tablet Take 1 tablet (10 mg total) by mouth daily. 11/22/16   Elsie Stain, MD  Cholecalciferol (VITAMIN D3) 5000 units CAPS Take 5,000 Units by mouth daily.  [provider]  diclofenac (VOLTAREN) 75 MG EC tablet Take 1 tablet (75 mg total) by mouth 2 (two) times daily as needed. Patient taking differently: Take 75 mg by mouth 2 (two) times daily as needed for mild pain.  05/10/17   Jessy Oto, MD  diclofenac sodium (VOLTAREN) 1 % GEL Apply 2 g topically 4 (four) times daily. Patient taking differently: Apply 2 g topically 4 (four) times daily as needed (pain).  05/04/17   Jessy Oto, MD  DULoxetine (CYMBALTA) 30 MG capsule Take 1 capsule (30 mg total) by mouth 2 (two) times daily. 08/15/17   Ladell Pier, MD  fluticasone (FLONASE) 50 MCG/ACT nasal spray Place 2 sprays into both nostrils 2  (two) times daily. 07/06/16   Elsie Stain, MD  furosemide (LASIX) 20 MG tablet TAKE 1 TABLET (20 MG TOTAL) BY MOUTH DAILY AS NEEDED. Patient taking differently: Take 20 mg by mouth daily as needed for fluid.  07/14/17   Ladell Pier, MD  gabapentin (NEURONTIN) 300 MG capsule 1 tab PO Qa.m and noon and 2 tabs Q p.m Patient taking differently: Take 300-600 mg by mouth 2 (two) times daily. 1 tab PO Qa.m and noon and 2 tabs Q p.m 05/18/17   Ladell Pier, MD  guaiFENesin (MUCINEX) 600 MG 12 hr tablet Take 1 tablet (600 mg total) by mouth 2 (two) times daily. 09/08/17   Florencia Reasons, MD  hydrochlorothiazide (MICROZIDE) 12.5 MG capsule Take 1 capsule (12.5 mg total) by mouth daily. 09/15/17   Ladell Pier, MD  hydrOXYzine (ATARAX/VISTARIL) 10 MG tablet Take 1 tablet (10 mg total) by mouth 3 (three) times daily as needed for anxiety. 09/08/17   Florencia Reasons, MD  insulin aspart (NOVOLOG) 100 UNIT/ML injection Insulin sliding scale: Blood sugar  120-150   3units                       151-200   4units                       201-250   7units                       251- 300  11units                       301-350   15uints                       351-400   20units                       >400         call MD immediately 09/21/17   Ladell Pier, MD  insulin glargine (LANTUS) 100 UNIT/ML injection Inject 0.16 mLs (16 Units total) into the skin at bedtime. 09/21/17   Ladell Pier, MD  ipratropium-albuterol (DUONEB) 0.5-2.5 (3) MG/3ML SOLN Take 3 mLs by nebulization every 6 (six) hours as needed. Patient taking differently: Take 3 mLs by nebulization every 6 (six) hours as needed. Wheezing 07/28/17   Mannam, Praveen, MD  LORazepam (ATIVAN) 1 MG tablet Take 1 tablet (1 mg total) by mouth as needed for anxiety (30 minutes before procedure). 09/19/17   Zehr, Laban Emperor, PA-C  metFORMIN (GLUCOPHAGE-XR) 500 MG 24 hr tablet Take 1 tablet (500 mg total) by mouth 2 (two) times daily. 09/21/17  Ladell Pier,  MD  montelukast (SINGULAIR) 10 MG tablet Take 1 tablet (10 mg total) by mouth daily. 08/09/17 08/09/18  Marshell Garfinkel, MD  Multiple Vitamins-Minerals (WOMENS MULTI) CAPS Take 1 capsule by mouth daily.    [provider]  pantoprazole (PROTONIX) 40 MG tablet Take 1 tablet (40 mg total) by mouth 2 (two) times daily before a meal. 09/19/17   Zehr, Janett Billow D, PA-C  predniSONE (DELTASONE) 10 MG tablet Take 62ms (3tabs) daily for three days, then 243m(2tabs) daily for two days, then 1061maily for two days , then stop. 09/08/17   Xu,Florencia ReasonsD  promethazine (PHENERGAN) 25 MG tablet Take 1 tablet (25 mg total) by mouth every 8 (eight) hours as needed for nausea or vomiting. 11/17/16   FunBoykin NearingD  Respiratory Therapy Supplies (FLUTTER) DEVI As directed. 07/28/17   ManMarshell GarfinkelD  Spacer/Aero-Holding Chambers (AEROCHAMBER MV) inhaler Use as instructed 01/25/17   WriElsie StainD  tiZANidine (ZANAFLEX) 4 MG tablet TAKE 1 TABLET BY MOUTH EVERY 6 HOURS AS NEEDED FOR MUSCLE SPASMS. 09/13/17   JohLadell PierD    Family History Family History  Problem Relation Age of Onset  . Hypothyroidism Mother   . Diabetes Father   . Diabetes Paternal Uncle   . Hypertension Maternal Grandmother   . Hypertension Maternal Grandfather   . Pancreatic cancer Maternal Grandfather   . Hypertension Paternal Grandmother   . Heart disease Paternal Grandfather   . Hypertension Paternal Grandfather     Social History Social History   Tobacco Use  . Smoking status: Never Smoker  . Smokeless tobacco: Never Used  Substance Use Topics  . Alcohol use: No  . Drug use: No     Allergies   Bee venom; Cinnamon; Doxycycline; Ibuprofen; Lexapro [escitalopram oxalate]; and Tape   Review of Systems Review of Systems  Constitutional: Negative for chills and fever.  HENT: Negative for sore throat and tinnitus.   Eyes: Negative for redness.  Respiratory: Negative for cough and shortness of breath.    Cardiovascular: Negative for chest pain and palpitations.  Gastrointestinal: Negative for abdominal pain, diarrhea, nausea and vomiting.  Genitourinary: Negative for dysuria, frequency and urgency.  Musculoskeletal: Negative for myalgias.  Skin: Positive for wound. Negative for rash.       No lesions  Neurological: Negative for weakness.  Hematological: Does not bruise/bleed easily.  Psychiatric/Behavioral: Negative for suicidal ideas.     Physical Exam Triage Vital Signs ED Triage Vitals [04/18/17 1946]  Enc Vitals Group     BP (!) 173/103     Pulse Rate (!) 109     Resp 18     Temp 98.6 F (37 C)     Temp Source Oral     SpO2 99 %     Weight      Height      Head Circumference      Peak Flow      Pain Score 8     Pain Loc      Pain Edu?      Excl. in GC?[place]  No data found.  Updated Vital Signs BP (!) 173/103 (BP Location: Right Arm)   Pulse (!) 109   Temp 98.6 F (37 C) (Oral)   Resp 18   SpO2 99%   Visual Acuity Right Eye Distance:   Left Eye Distance:   Bilateral Distance:    Right Eye Near:   Left Eye Near:  Bilateral Near:     Physical Exam  Constitutional: She is oriented to person, place, and time. She appears well-developed and well-nourished. No distress.  HENT:  Head: Normocephalic and atraumatic.  Mouth/Throat: Oropharynx is clear and moist.  Eyes: Conjunctivae and EOM are normal. Pupils are equal, round, and reactive to light. No scleral icterus.  Neck: Normal range of motion. Neck supple. No JVD present. No tracheal deviation present. No thyromegaly present.  Cardiovascular: Normal rate, regular rhythm and normal heart sounds. Exam reveals no gallop and no friction rub.  No murmur heard. Pulmonary/Chest: Effort normal and breath sounds normal.  Abdominal: Soft. Bowel sounds are normal. She exhibits no distension. There is no tenderness.  Musculoskeletal: Normal range of motion. She exhibits no edema.  Lymphadenopathy:    She has no  cervical adenopathy.  Neurological: She is alert and oriented to person, place, and time. No cranial nerve deficit.  Skin: Skin is warm and dry.  Psychiatric: She has a normal mood and affect. Her behavior is normal. Judgment and thought content normal.  Nursing note and vitals reviewed.    UC Treatments / Results  Labs (all labs ordered are listed, but only abnormal results are displayed) Labs Reviewed - No data to display  EKG  EKG Interpretation None       Radiology No results found.  Procedures Procedures (including critical care time)  Medications Ordered in UC Medications - No data to display   Initial Impression / Assessment and Plan / UC Course  I have reviewed the triage vital signs and the nursing notes.  Pertinent labs & imaging results that were available during my care of the patient were reviewed by me and considered in my medical decision making (see chart for details).     Cover for possible infection.   Final Clinical Impressions(s) / UC Diagnoses   Final diagnoses:  Wound disruption, initial encounter  Right foot pain    ED Discharge Orders        Ordered    sulfamethoxazole-trimethoprim (BACTRIM DS,SEPTRA DS) 800-160 MG tablet  2 times daily     04/18/17 2013       Controlled Substance Prescriptions [place] Controlled Substance Registry consulted? N/A   Harrie Foreman, MD 09/27/17 [phone]

## 2017-09-28 ENCOUNTER — Telehealth: Payer: Self-pay

## 2017-09-28 ENCOUNTER — Encounter: Payer: Self-pay | Admitting: Internal Medicine

## 2017-09-28 ENCOUNTER — Other Ambulatory Visit: Payer: Self-pay

## 2017-09-28 DIAGNOSIS — G4733 Obstructive sleep apnea (adult) (pediatric): Secondary | ICD-10-CM

## 2017-09-28 MED ORDER — TRUEPLUS LANCETS 28G MISC: 12 refills | Status: DC

## 2017-09-28 MED ORDER — GLUCOSE BLOOD VI STRP: ORAL_STRIP | 12 refills | Status: DC

## 2017-09-28 MED FILL — TRUEplus LANCETS 28G MISC: 25 days supply | Qty: 100 | Fill #0

## 2017-09-28 MED FILL — TRUE METRIX TEST STRIP: 100 days supply | Qty: 100 | Fill #0

## 2017-09-28 NOTE — Telephone Encounter (Signed)
-----   Message from Chilton Greathouse, MD sent at 09/28/2017  1:22 AM EDT ----- Sleep study shoiws moderate sleep apnea with low O2 levels. Please order CPAP titration study  ----- Message ----- From: Coralyn Helling, MD Sent: 09/26/2017   8:27 AM To: Chilton Greathouse, MD  Praveen,  Home sleep study from 09/20/17 moderate sleep apnea with an AHI of 20 and SaO2 low of 56%.  Given severity of oxygen desaturation also concerned about OHS.  She should have in lab titration study.  Thanks.  [place]

## 2017-09-28 NOTE — Telephone Encounter (Signed)
Pt is aware of results and voiced her understanding.  Cpap titration has ben ordered. Nothing further is neeed.

## 2017-09-29 ENCOUNTER — Encounter: Payer: Self-pay | Admitting: Internal Medicine

## 2017-09-29 ENCOUNTER — Encounter (INDEPENDENT_AMBULATORY_CARE_PROVIDER_SITE_OTHER): Payer: Self-pay | Admitting: Specialist

## 2017-09-29 ENCOUNTER — Telehealth: Payer: Self-pay | Admitting: Internal Medicine

## 2017-09-29 NOTE — Telephone Encounter (Signed)
Returned pt call and she stated she has messaged Dr. Laural Benes and she has not gotten a response I informed pt that she gets emails and phone calls thorugh out the whole day and she does be in clinic [phone] and 130-530 I informed pt that once Dr. Laural Benes gets time she will get to her emails and phone calls  Pt states now her nose is bleeding clots. I informed pt that I will send this message to Dr. Laural Benes

## 2017-09-29 NOTE — Telephone Encounter (Signed)
Pt called to speak with the nurse about her med, please call her back

## 2017-09-29 NOTE — Telephone Encounter (Signed)
Message sent to pt via My chart

## 2017-10-02 ENCOUNTER — Encounter: Payer: Self-pay | Admitting: Internal Medicine

## 2017-10-02 ENCOUNTER — Telehealth: Payer: Self-pay

## 2017-10-02 ENCOUNTER — Ambulatory Visit (HOSPITAL_BASED_OUTPATIENT_CLINIC_OR_DEPARTMENT_OTHER): Payer: No Typology Code available for payment source | Attending: Pulmonary Disease | Admitting: Pulmonary Disease

## 2017-10-02 ENCOUNTER — Ambulatory Visit: Payer: Self-pay | Attending: Internal Medicine | Admitting: Internal Medicine

## 2017-10-02 VITALS — Ht 64.0 in | Wt >= 6400 oz

## 2017-10-02 VITALS — BP 154/98 | HR 108 | Temp 98.6°F | Resp 16 | Wt >= 6400 oz

## 2017-10-02 DIAGNOSIS — IMO0001 Reserved for inherently not codable concepts without codable children: Secondary | ICD-10-CM

## 2017-10-02 DIAGNOSIS — Z881 Allergy status to other antibiotic agents status: Secondary | ICD-10-CM | POA: Insufficient documentation

## 2017-10-02 DIAGNOSIS — Z833 Family history of diabetes mellitus: Secondary | ICD-10-CM | POA: Insufficient documentation

## 2017-10-02 DIAGNOSIS — M130 Polyarthritis, unspecified: Secondary | ICD-10-CM

## 2017-10-02 DIAGNOSIS — R040 Epistaxis: Secondary | ICD-10-CM

## 2017-10-02 DIAGNOSIS — Z888 Allergy status to other drugs, medicaments and biological substances status: Secondary | ICD-10-CM | POA: Insufficient documentation

## 2017-10-02 DIAGNOSIS — G4733 Obstructive sleep apnea (adult) (pediatric): Secondary | ICD-10-CM | POA: Insufficient documentation

## 2017-10-02 DIAGNOSIS — F329 Major depressive disorder, single episode, unspecified: Secondary | ICD-10-CM | POA: Insufficient documentation

## 2017-10-02 DIAGNOSIS — E1165 Type 2 diabetes mellitus with hyperglycemia: Secondary | ICD-10-CM | POA: Insufficient documentation

## 2017-10-02 DIAGNOSIS — I10 Essential (primary) hypertension: Secondary | ICD-10-CM

## 2017-10-02 DIAGNOSIS — Z79899 Other long term (current) drug therapy: Secondary | ICD-10-CM | POA: Insufficient documentation

## 2017-10-02 DIAGNOSIS — Z794 Long term (current) use of insulin: Secondary | ICD-10-CM | POA: Insufficient documentation

## 2017-10-02 DIAGNOSIS — Z8249 Family history of ischemic heart disease and other diseases of the circulatory system: Secondary | ICD-10-CM | POA: Insufficient documentation

## 2017-10-02 LAB — GLUCOSE, POCT (MANUAL RESULT ENTRY): POC GLUCOSE: 280 mg/dL — AB (ref 70–99)

## 2017-10-02 MED ORDER — HYDROCHLOROTHIAZIDE 25 MG PO TABS: 25.0000 mg | ORAL_TABLET | Freq: Every day | ORAL | 3 refills | Status: AC

## 2017-10-02 MED ORDER — INSULIN GLARGINE 100 UNIT/ML ~~LOC~~ SOLN: 27.0000 [IU] | Freq: Every day | SUBCUTANEOUS | 2 refills | Status: DC

## 2017-10-02 MED ORDER — METFORMIN HCL ER 500 MG PO TB24: ORAL_TABLET | ORAL | 6 refills | Status: DC

## 2017-10-02 MED FILL — HYDROCHLOROTHIAZIDE 25 MG T: 25 | 30 days supply | Qty: 30 | Fill #0

## 2017-10-02 MED FILL — ACETAMINOPHEN/COD #3 TABLET: 300-30 | 30 days supply | Qty: 120 | Fill #2

## 2017-10-02 MED FILL — METFORMIN HCL ER 500 MG TAB: 500 | 30 days supply | Qty: 90 | Fill #0

## 2017-10-02 NOTE — Progress Notes (Signed)
Patient ID: Courtney Zamora, female    DOB: [birth year]  MRN: [id number]  CC: No chief complaint on file.   Subjective: Courtney Zamora is a 32 y.o. female who presents for UC visit Her concerns today include:   1.  Will be having blood work done as ordered by GI and wanted to know if I still wanted to rheumatoid panel on her due to polyarthritis symptoms  2.  DM:  Checking BS 4 x a day.  Did not have log with her today. BS this a.m was 202; yesterday a.m was in 200s. She has titrated Lantus.  Currently on Lantus 22 and Metformin 500 mg BID.  On average she takes Novolog 4-11 units with meals; most have been 7 units. -she is off of Prednisone  3.  C/o LT sided nose bleeds since sinus infection in Nov 2018 Episodes occur mainly in a.m if she blows her nose.  + clots.  Last 10-15 mins. Using Flonase spray for allergies.   Patient Active Problem List   Diagnosis Date Noted  . Abnormal CT scan, esophagus 09/19/2017  . Dysphagia 09/19/2017  . Elevated LFTs 09/19/2017  . Newly diagnosed diabetes ([place]) 09/15/2017  . Hepatic steatosis 09/15/2017  . DM2 (diabetes mellitus, type 2) ([place]) 09/06/2017  . SOB (shortness of breath) 09/06/2017  . CAP (community acquired pneumonia) 08/03/2017  . Acute frontal sinusitis 12/15/2016  . Metatarsal stress fracture, right, sequela 11/17/2016  . Neck muscle spasm 10/05/2016  . Nipple discharge in female 09/13/2016  . Depression 08/17/2016  . Anxiety 08/17/2016  . ASCUS with positive high risk HPV cervical 08/14/2016  . Hypertension 08/14/2016  . Long term (current) use of systemic steroids 08/14/2016  . Esophageal reflux 12/29/2015  . Morbid (severe) obesity due to excess calories ([place]) 12/29/2015  . Chronic pain of right knee 12/29/2015  . Chronic pain of left ankle 12/29/2015  . 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 Asthma, moderate persistent 12/09/2015     Current Outpatient Medications on File Prior to Visit  Medication Sig Dispense  Refill  . acetaminophen-codeine (TYLENOL #3) 300-30 MG tablet Take 1 tablet by mouth every 4 (four) hours as needed for moderate pain.    Marland Kitchen albuterol (PROVENTIL HFA;VENTOLIN HFA) 108 (90 Base) MCG/ACT inhaler Inhale 2 puffs into the lungs every 6 (six) hours as needed for wheezing or shortness of breath (wheezing). For shortness of breath. 54 Inhaler 0  . albuterol (PROVENTIL) (2.5 MG/3ML) 0.083% nebulizer solution Take 3 mLs (2.5 mg total) by nebulization every 6 (six) hours as needed for wheezing or shortness of breath. 150 mL 1  . blood glucose meter kit and supplies KIT Dispense based on patient and insurance preference. Use up to four times daily as directed. (FOR ICD-9 250.00, 250.01). 1 each 0  . budesonide-formoterol (SYMBICORT) 160-4.5 MCG/ACT inhaler Inhale 2 puffs into the lungs 2 (two) times daily. 3 Inhaler 3  . Calcium Citrate 200 MG TABS Take 2 tablets (400 mg total) by mouth every other day. (Patient taking differently: Take 400 mg by mouth at bedtime. )    . cetirizine (ZYRTEC) 10 MG tablet Take 1 tablet (10 mg total) by mouth daily. 30 tablet 11  . Cholecalciferol (VITAMIN D3) 5000 units CAPS Take 5,000 Units by mouth daily.     . diclofenac (VOLTAREN) 75 MG EC tablet Take 1 tablet (75 mg total) by mouth 2 (two) times daily as needed. (Patient taking differently: Take 75 mg by mouth 2 (two) times daily  as needed for mild pain. ) 60 tablet 2  . diclofenac sodium (VOLTAREN) 1 % GEL Apply 2 g topically 4 (four) times daily. (Patient taking differently: Apply 2 g topically 4 (four) times daily as needed (pain). ) 3 Tube 3  . DULoxetine (CYMBALTA) 30 MG capsule Take 1 capsule (30 mg total) by mouth 2 (two) times daily. 60 capsule 6  . fluticasone (FLONASE) 50 MCG/ACT nasal spray Place 2 sprays into both nostrils 2 (two) times daily. 16 g 6  . furosemide (LASIX) 20 MG tablet TAKE 1 TABLET (20 MG TOTAL) BY MOUTH DAILY AS NEEDED. (Patient taking differently: Take 20 mg by mouth daily as needed  for fluid. ) 12 tablet 2  . gabapentin (NEURONTIN) 300 MG capsule 1 tab PO Qa.m and noon and 2 tabs Q p.m (Patient taking differently: Take 300-600 mg by mouth 2 (two) times daily. 1 tab PO Qa.m and noon and 2 tabs Q p.m) 120 capsule 4  . glucose blood (TRUE METRIX BLOOD GLUCOSE TEST) test strip Check sugar 3-4 times a day before meals 100 each 12  . guaiFENesin (MUCINEX) 600 MG 12 hr tablet Take 1 tablet (600 mg total) by mouth 2 (two) times daily. 60 tablet 0  . hydrochlorothiazide (MICROZIDE) 12.5 MG capsule Take 1 capsule (12.5 mg total) by mouth daily. 90 capsule 1  . hydrOXYzine (ATARAX/VISTARIL) 10 MG tablet Take 1 tablet (10 mg total) by mouth 3 (three) times daily as needed for anxiety. 30 tablet 0  . insulin aspart (NOVOLOG) 100 UNIT/ML injection Insulin sliding scale: Blood sugar  120-150   3units                       151-200   4units                       201-250   7units                       251- 300  11units                       301-350   15uints                       351-400   20units                       >400         call MD immediately 10 mL 2  . insulin glargine (LANTUS) 100 UNIT/ML injection Inject 0.16 mLs (16 Units total) into the skin at bedtime. 10 mL 2  . ipratropium-albuterol (DUONEB) 0.5-2.5 (3) MG/3ML SOLN Take 3 mLs by nebulization every 6 (six) hours as needed. (Patient taking differently: Take 3 mLs by nebulization every 6 (six) hours as needed. Wheezing) 360 mL 5  . LORazepam (ATIVAN) 1 MG tablet Take 1 tablet (1 mg total) by mouth as needed for anxiety (30 minutes before procedure). 2 tablet 0  . metFORMIN (GLUCOPHAGE-XR) 500 MG 24 hr tablet Take 1 tablet (500 mg total) by mouth 2 (two) times daily. 60 tablet 6  . montelukast (SINGULAIR) 10 MG tablet Take 1 tablet (10 mg total) by mouth daily. 30 tablet 5  . Multiple Vitamins-Minerals (WOMENS MULTI) CAPS Take 1 capsule by mouth daily.    . pantoprazole (PROTONIX) 40 MG tablet Take 1 tablet (40 mg total) by  mouth 2 (two) times daily before a meal. 60 tablet 3  . predniSONE (DELTASONE) 10 MG tablet Take '30mg'$ s (3tabs) daily for three days, then '20mg'$  (2tabs) daily for two days, then '10mg'$  daily for two days , then stop. 15 tablet 0  . promethazine (PHENERGAN) 25 MG tablet Take 1 tablet (25 mg total) by mouth every 8 (eight) hours as needed for nausea or vomiting. 2060 tablet 2  . Respiratory Therapy Supplies (FLUTTER) DEVI As directed. 1 each 0  . Spacer/Aero-Holding Chambers (AEROCHAMBER MV) inhaler Use as instructed 1 each 0  . tiZANidine (ZANAFLEX) 4 MG tablet TAKE 1 TABLET BY MOUTH EVERY 6 HOURS AS NEEDED FOR MUSCLE SPASMS. 60 tablet 2  . TRUEPLUS LANCETS 28G MISC Check sugar 3-4 times a day before meals 100 each 12   No current facility-administered medications on file prior to visit.     Allergies  Allergen Reactions  . Bee Venom Anaphylaxis  . Cinnamon Anaphylaxis  . Doxycycline Hives  . Ibuprofen Other (See Comments)    Abdominal pain  . Lexapro [Escitalopram Oxalate]     Generalized body shaking. ? sz  . Tape Hives and Rash    EKG STICKERS give pt rash, hives    Social History   Socioeconomic History  . Marital status: Married    Spouse name: Not on file  . Number of children: Not on file  . Years of education: Not on file  . Highest education level: Not on file  Social Needs  . Financial resource strain: Not on file  . Food insecurity - worry: Not on file  . Food insecurity - inability: Not on file  . Transportation needs - medical: Not on file  . Transportation needs - non-medical: Not on file  Occupational History  . Not on file  Tobacco Use  . Smoking status: Never Smoker  . Smokeless tobacco: Never Used  Substance and Sexual Activity  . Alcohol use: No  . Drug use: No  . Sexual activity: Yes    Birth control/protection: None  Other Topics Concern  . Not on file  Social History Narrative  . Not on file    Family History  Problem Relation Age of Onset  .  Hypothyroidism Mother   . Diabetes Father   . Diabetes Paternal Uncle   . Hypertension Maternal Grandmother   . Hypertension Maternal Grandfather   . Pancreatic cancer Maternal Grandfather   . Hypertension Paternal Grandmother   . Heart disease Paternal Grandfather   . Hypertension Paternal Grandfather     Past Surgical History:  Procedure Laterality Date  . TONSILLECTOMY      ROS: Review of Systems Neg except as above PHYSICAL EXAM: There were no vitals taken for this visit.  Physical Exam 142/90 General appearance - alert, well appearing, and in no distress Mental status - alert, oriented to person, place, and time, normal mood, behavior, speech, dress, motor activity, and thought processes Nose - dry mucosa Mouth - mucous membranes moist, pharynx normal without lesions Chest - clear to auscultation, no wheezes, rales or rhonchi, symmetric air entry Heart - normal rate, regular rhythm, normal S1, S2, no murmurs, rubs, clicks or gallops  Results for orders placed or performed in visit on 10/02/17  POCT glucose (manual entry)  Result Value Ref Range   POC Glucose 280 (A) 70 - 99 mg/dl     ASSESSMENT AND PLAN: 1. Diabetes mellitus type 2, uncontrolled, without complications ([place]) Not at goal Inc Lantus to 27  units, Change Novolog to 10 units TID with meals; inc Metformin to 1 gram a.m and 500 mg p.m See clin pharmacist next week for continue titration.  At some point, we can add Victoza - metFORMIN (GLUCOPHAGE-XR) 500 MG 24 hr tablet; 2 tabs PO Q a.m and 1 tab Q p.m  Dispense: 90 tablet; Refill: 6 - insulin glargine (LANTUS) 100 UNIT/ML injection; Inject 0.27 mLs (27 Units total) into the skin at bedtime.  Dispense: 10 mL; Refill: 2 - POCT glucose (manual entry)  2. Hypertension, unspecified type Not at goal.  Inc HCTZ to 25 mg daily.  BMP after being on this dose for 4-5 days - hydrochlorothiazide (HYDRODIURIL) 25 MG tablet; Take 1 tablet (25 mg total) by mouth daily.   Dispense: 90 tablet; Refill: 3  3. Epistaxis - Ambulatory referral to ENT  4. Polyarthritis - Sedimentation Rate; Future - ANA w/Reflex if Positive; Future - Rheumatoid factor; Future - CYCLIC CITRUL PEPTIDE ANTIBODY, IGG/IGA; Future  Patient was given the opportunity to ask questions.  Patient verbalized understanding of the plan and was able to repeat key elements of the plan.   No orders of the defined types were placed in this encounter.    Requested Prescriptions    No prescriptions requested or ordered in this encounter    No Follow-up on file.  Karle Plumber, MD, FACP

## 2017-10-02 NOTE — Telephone Encounter (Signed)
Pt states she wants to know if Dr. Laural Benes is wanting her to have any blood work done when she goes to the GI doctor. Pt states because it will be easier for her so she doesn't have to get stuck so many times. Pt states if you want her to have blood work done at the GI Dr. Laural Benes would just need to put an order in. Pt states she can get blood drawn at the GI or at the office [facility].  Pt states she has been doing the 22 units of lantus at night and blood sugars are not below 130. Pt states her sugar last night was in the 200's. Pt states her sugar this morning was 202 and she took 7 units of novolog. Pt states last night was the third night of doing 22 units of lantus and tonight pt states she will go up to 24 units of lantus.   Pt wants to know what should she do because her sugars are not in the range that you want them to be in  Pt states she has been doing the saline spray and it has not gotten any better.

## 2017-10-02 NOTE — Patient Instructions (Addendum)
Give appointment with Kennyth Arnold in 1 week for further insulin titration  Increase Lantus to 27 units daily. Change Novolog to 10 units with meals. Increase Metformin to 1000 mg in morning and 500 mg in evenings.  Stop Flonase.  You have been referred to ENT.   Increase HCTZ to 25 mg daily.   Please come to lab after you have been on this dose for 5 days.

## 2017-10-04 ENCOUNTER — Ambulatory Visit: Payer: MEDICAID | Attending: Internal Medicine

## 2017-10-05 DIAGNOSIS — G4730 Sleep apnea, unspecified: Secondary | ICD-10-CM

## 2017-10-05 NOTE — Procedures (Signed)
Patient Name: Courtney Zamora, Courtney Zamora Date: 10/02/2017 Gender: Female D.O.B: 1986/04/13 Age (years): 31 Referring Provider: Chilton Greathouse Height (inches): 64 Interpreting Physician: Cyril Mourning MD, ABSM Weight (lbs): 404 RPSGT: Ulyess Mort BMI: 69 MRN: [id number] Neck Size: 17.00 <br> <br> CLINICAL INFORMATION The patient is referred for a CPAP titration to treat sleep apnea.  Date of HST: 09/20/17 moderate sleep apnea with an AHI of 20 and SaO2 low of 56%.   SLEEP STUDY TECHNIQUE As per the AASM Manual for the Scoring of Sleep and Associated Events v2.3 (April 2016) with a hypopnea requiring 4% desaturations.  The channels recorded and monitored were frontal, central and occipital EEG, electrooculogram (EOG), submentalis EMG (chin), nasal and oral airflow, thoracic and abdominal wall motion, anterior tibialis EMG, snore microphone, electrocardiogram, and pulse oximetry. Continuous positive airway pressure (CPAP) was initiated at the beginning of the study and titrated to treat sleep-disordered breathing.  MEDICATIONS Medications self-administered by patient taken the night of the study : LANTUS  RESPIRATORY PARAMETERS Optimal PAP Pressure (cm): 21 AHI at Optimal Pressure (/hr): 4.5 Overall Minimal O2 (%): 68.0 Supine % at Optimal Pressure (%): 100 Minimal O2 at Optimal Pressure (%): 86.0   SLEEP ARCHITECTURE The study was initiated at 10:59:01 PM and ended at 5:06:15 AM.  Sleep onset time was 0.3 minutes and the sleep efficiency was 88.5%%. The total sleep time was 324.9 minutes.  The patient spent 11.9%% of the night in stage N1 sleep, 67.7%% in stage N2 sleep, 10.8%% in stage N3 and 9.70% in REM.Stage REM latency was 310.0 minutes  Wake after sleep onset was 42.0. Alpha intrusion was absent. Supine sleep was 94.58%.  CARDIAC DATA The 2 lead EKG demonstrated sinus rhythm. The mean heart rate was 100.7 beats per minute. Other EKG findings include: None.   LEG MOVEMENT  DATA The total Periodic Limb Movements of Sleep (PLMS) were 0. The PLMS index was 0.0. A PLMS index of <15 is considered normal in adults.  IMPRESSIONS - The optimal PAP pressure was 21 cm of water. - Central sleep apnea was not noted during this titration (CAI = 0.0/h). - Severe oxygen desaturations were observed during this titration (min O2 = 68.0%). - The patient snored with moderate snoring volume during this titration study. - No cardiac abnormalities were observed during this study. - Clinically significant periodic limb movements were not noted during this study. Arousals associated with PLMs were rare.   DIAGNOSIS - Obstructive Sleep Apnea (327.23 [G47.33 ICD-10])   RECOMMENDATIONS - Trial of Auto CPAP therapy 10- 21 cm H2O with a Medium size Resmed Full Face Mask AirFit F30 mask and heated humidification. - Avoid alcohol, sedatives and other CNS depressants that may worsen sleep apnea and disrupt normal sleep architecture. - Sleep hygiene should be reviewed to assess factors that may improve sleep quality. - Weight management and regular exercise should be initiated or continued. - Return to Sleep Center for re-evaluation after 4 weeks of therapy    Cyril Mourning MD Board Certified in Sleep medicine

## 2017-10-09 ENCOUNTER — Telehealth: Payer: Self-pay

## 2017-10-09 ENCOUNTER — Encounter (HOSPITAL_COMMUNITY): Payer: Self-pay | Admitting: Emergency Medicine

## 2017-10-09 ENCOUNTER — Ambulatory Visit (INDEPENDENT_AMBULATORY_CARE_PROVIDER_SITE_OTHER): Payer: Self-pay

## 2017-10-09 ENCOUNTER — Ambulatory Visit (HOSPITAL_COMMUNITY)
Admission: EM | Admit: 2017-10-09 | Discharge: 2017-10-09 | Disposition: A | Discharge status: Home / Self Care | Payer: Self-pay | Attending: Family Medicine | Admitting: Family Medicine

## 2017-10-09 ENCOUNTER — Telehealth: Payer: Self-pay | Admitting: Internal Medicine

## 2017-10-09 DIAGNOSIS — J45901 Unspecified asthma with (acute) exacerbation: Secondary | ICD-10-CM

## 2017-10-09 DIAGNOSIS — G4733 Obstructive sleep apnea (adult) (pediatric): Secondary | ICD-10-CM

## 2017-10-09 MED ORDER — AMOXICILLIN-POT CLAVULANATE 875-125 MG PO TABS: 1.0000 | ORAL_TABLET | Freq: Two times a day (BID) | ORAL | 0 refills | Status: DC

## 2017-10-09 MED ORDER — PREDNISONE 20 MG PO TABS: ORAL_TABLET | ORAL | 0 refills | Status: DC

## 2017-10-09 MED FILL — AMOX-CLAV 875-125 MG TABLET: 875-125 | 7 days supply | Qty: 14 | Fill #0

## 2017-10-09 MED FILL — ?TIZANIDINE HCL 4MG TABLETS: 4 | 15 days supply | Qty: 60 | Fill #1

## 2017-10-09 MED FILL — ?CETIRIZINE HCL 10 MG TABLE: 10 | 30 days supply | Qty: 30 | Fill #10

## 2017-10-09 MED FILL — ?MONTELUKAST SOD 10 MG TAB: 10 | 30 days supply | Qty: 30 | Fill #2

## 2017-10-09 MED FILL — PROMETHAZINE 25 MG TABLET: 25 | 30 days supply | Qty: 90 | Fill #8

## 2017-10-09 NOTE — Telephone Encounter (Signed)
Called pt to relay sleep study results.  Pt states she is experiencing same symptoms prior to admission last year for PNA. Pt reports of sob with exertion and at rest, sore throat, prod cough with green mucus & chest discomfort into middle back x1.5w. Denies fever, chills, sweats, or body aches.   Dr. Isaiah Serge please advise. Thanks

## 2017-10-09 NOTE — Telephone Encounter (Signed)
Spoke with patient. She wants to try the augmentin 875mg . Will go ahead and call in medication to the [facility].   Nothing else needed at time of call.

## 2017-10-09 NOTE — Telephone Encounter (Signed)
Offer levaquin 750 mg/day for 7 days or augmentin 875 mg bid for 7 days.

## 2017-10-09 NOTE — ED Provider Notes (Signed)
  [facility]    CSN: [id number] Arrival date & time: 10/09/17  0973  Chief Complaint  Patient presents with  . Cough    Courtney Zamora here for URI complaints.  Duration: 1.5 weeks, had PNA 1 mo ago and required IV abx, pcp stated that she should go be admitted if she has pna again.  Associated symptoms: sinus congestion, rhinorrhea, sore throat, wheezing, shortness of breath, chest tightness and cough Denies: sinus pain, itchy watery eyes, ear fullness, ear pain, ear drainage, myalgia and fevers Treatment to date: Albuterol inhaler/neb tx Sick contacts: No  ROS:  Const: Denies fevers HEENT: As noted in HPI Lungs: +SOB  Past Medical History:  Diagnosis Date  . Anxiety   . Asthma   . Depression   . Essential hypertension   . GERD (gastroesophageal reflux disease)   . Obesity   . Osteoarthritis, knee   . Seizures (HCC)   . Severe needle phobia    Family History  Problem Relation Age of Onset  . Hypothyroidism Mother   . Diabetes Father   . Diabetes Paternal Uncle   . Hypertension Maternal Grandmother   . Hypertension Maternal Grandfather   . Pancreatic cancer Maternal Grandfather   . Hypertension Paternal Grandmother   . Heart disease Paternal Grandfather   . Hypertension Paternal Grandfather     BP (!) 179/105 (BP Location: Right Arm)   Pulse (!) 119   Temp 97.9 F (36.6 C) (Oral)   Resp 18  General: Awake, alert, appears stated age, poor hygiene HEENT: AT, [place], ears patent b/l and TM's neg, nares patent w/o discharge, pharynx pink and without exudates, MMM Neck: No masses or asymmetry Heart: RRR (HR approx 84 when I listened) Lungs: CTAB, poor inspiratory effort, no accessory muscle use Psych: Age appropriate judgment and insight, normal mood and affect  Exacerbation of asthma, unspecified asthma severity, unspecified whether persistent  CXR shows inflammation but no pneumonia. Will treat with a steroid burst, 40 mg pred for 5 d. I stated this  to patient and she seemed very upset. I offered that she can go to the ED and she refused. She stated she is not about to wait several more hours. I told her that I do not have admitting privileges at any hospital which she remained skeptical of.  Continue to push fluids, practice good hand hygiene, cover mouth when coughing. F/u w pcp. Pt reluctantly agreed to above plan as opposed to going to ER.    Courtney Zamora, [place] 10/09/17 2106

## 2017-10-09 NOTE — Telephone Encounter (Signed)
Prescribe Z pack

## 2017-10-09 NOTE — Telephone Encounter (Signed)
Spoke with pt. States that Zpack does nothing for her, she wants something stronger.  Dr. Isaiah Serge - please advise. Thanks.

## 2017-10-09 NOTE — Telephone Encounter (Signed)
Returned pt call and pt stated she is having the same symptoms she did when she ended up in the hospital back in February. Pt states she is having a high pulse rate, chest pain, sob, coughing, sore throat winded and her blood sugars have been running high in the 200's. Pt states she spoke with her pulmonologist and they were going to do an antibiotics  a 7 day course of Augmentin. I informed pt to go to the ED or Urgent care because of the symptoms that she is having. Pt states she is going to go to the ED

## 2017-10-09 NOTE — Telephone Encounter (Signed)
Pt called to speak with a nurse in regards to her symptoms she has been having which are similar to what she had on 09/05/2017 when she was diagnosed Pneumonia at the ED. Please follow up

## 2017-10-09 NOTE — Telephone Encounter (Addendum)
She will need to be evaluated at the ED.

## 2017-10-09 NOTE — Discharge Instructions (Addendum)
The radiologist does not think that this is pneumonia.   Continue breathing treatments as needed.  If worsening symptoms, go to ER.

## 2017-10-09 NOTE — ED Triage Notes (Signed)
Pt here for cough with recent hx of PNA; pt sts PCP requests pt to go to hospital if has PNA for admission

## 2017-10-10 ENCOUNTER — Other Ambulatory Visit: Payer: Self-pay | Admitting: Gastroenterology

## 2017-10-10 ENCOUNTER — Other Ambulatory Visit: Payer: Self-pay | Admitting: Internal Medicine

## 2017-10-10 ENCOUNTER — Encounter: Payer: Self-pay | Admitting: Internal Medicine

## 2017-10-10 MED ORDER — ALBUTEROL SULFATE HFA 108 (90 BASE) MCG/ACT IN AERS: 2.0000 | INHALATION_SPRAY | Freq: Four times a day (QID) | RESPIRATORY_TRACT | 0 refills | Status: DC | PRN

## 2017-10-10 MED FILL — predniSONE 20 MG TABS: 20 | 21 days supply | Qty: 42 | Fill #0

## 2017-10-10 NOTE — Telephone Encounter (Signed)
May we refill her lorazepam ?

## 2017-10-11 ENCOUNTER — Encounter: Payer: Self-pay | Admitting: Pharmacist

## 2017-10-11 ENCOUNTER — Ambulatory Visit: Payer: Medicaid Other | Attending: Internal Medicine | Admitting: Pharmacist

## 2017-10-11 ENCOUNTER — Emergency Department (HOSPITAL_COMMUNITY)
Admission: EM | Admit: 2017-10-11 | Discharge: 2017-10-11 | Disposition: A | Discharge status: Home / Self Care | Payer: Medicaid Other | Attending: Emergency Medicine | Admitting: Emergency Medicine

## 2017-10-11 ENCOUNTER — Emergency Department (HOSPITAL_COMMUNITY): Payer: Medicaid Other

## 2017-10-11 ENCOUNTER — Other Ambulatory Visit: Payer: Self-pay

## 2017-10-11 ENCOUNTER — Encounter (HOSPITAL_COMMUNITY): Payer: Self-pay | Admitting: Emergency Medicine

## 2017-10-11 DIAGNOSIS — Z7689 Persons encountering health services in other specified circumstances: Secondary | ICD-10-CM | POA: Insufficient documentation

## 2017-10-11 DIAGNOSIS — I10 Essential (primary) hypertension: Secondary | ICD-10-CM | POA: Insufficient documentation

## 2017-10-11 DIAGNOSIS — E119 Type 2 diabetes mellitus without complications: Secondary | ICD-10-CM | POA: Insufficient documentation

## 2017-10-11 DIAGNOSIS — J45909 Unspecified asthma, uncomplicated: Secondary | ICD-10-CM | POA: Diagnosis not present

## 2017-10-11 DIAGNOSIS — R05 Cough: Secondary | ICD-10-CM | POA: Insufficient documentation

## 2017-10-11 DIAGNOSIS — Z794 Long term (current) use of insulin: Secondary | ICD-10-CM | POA: Insufficient documentation

## 2017-10-11 DIAGNOSIS — E1165 Type 2 diabetes mellitus with hyperglycemia: Secondary | ICD-10-CM

## 2017-10-11 DIAGNOSIS — R053 Chronic cough: Secondary | ICD-10-CM

## 2017-10-11 DIAGNOSIS — IMO0001 Reserved for inherently not codable concepts without codable children: Secondary | ICD-10-CM

## 2017-10-11 LAB — I-STAT BETA HCG BLOOD, ED (MC, WL, AP ONLY)

## 2017-10-11 LAB — BASIC METABOLIC PANEL
Anion gap: 12 (ref 5–15)
BUN: 20 mg/dL (ref 6–20)
CALCIUM: 9.2 mg/dL (ref 8.9–10.3)
CO2: 27 mmol/L (ref 22–32)
CREATININE: 0.65 mg/dL (ref 0.44–1.00)
Chloride: 100 mmol/L — ABNORMAL LOW (ref 101–111)
GFR calc Af Amer: >60 mL/min (ref >60)
GLUCOSE: 174 mg/dL — AB (ref 65–99)
Potassium: 4 mmol/L (ref 3.5–5.1)
SODIUM: 139 mmol/L (ref 135–145)

## 2017-10-11 LAB — CBC
HCT: 38.4 % (ref 36.0–46.0)
Hemoglobin: 11.3 g/dL — ABNORMAL LOW (ref 12.0–15.0)
MCH: 25.3 pg — ABNORMAL LOW (ref 26.0–34.0)
MCHC: 29.4 g/dL — AB (ref 30.0–36.0)
MCV: 86.1 fL (ref 78.0–100.0)
PLATELETS: 327 10*3/uL (ref 150–400)
RBC: 4.46 MIL/uL (ref 3.87–5.11)
RDW: 16 % — AB (ref 11.5–15.5)
WBC: 11.4 10*3/uL — AB (ref 4.0–10.5)

## 2017-10-11 LAB — I-STAT TROPONIN, ED
TROPONIN I, POC: 0 ng/mL (ref 0.00–0.08)
TROPONIN I, POC: 0 ng/mL (ref 0.00–0.08)

## 2017-10-11 LAB — URINALYSIS, ROUTINE W REFLEX MICROSCOPIC
BILIRUBIN URINE: NEGATIVE
GLUCOSE, UA: NEGATIVE mg/dL
HGB URINE DIPSTICK: NEGATIVE
Ketones, ur: 5 mg/dL — AB
Leukocytes, UA: NEGATIVE
Nitrite: NEGATIVE
PH: 6 (ref 5.0–8.0)
Protein, ur: NEGATIVE mg/dL
SPECIFIC GRAVITY, URINE: 1.018 (ref 1.005–1.030)

## 2017-10-11 LAB — CBG MONITORING, ED
Glucose-Capillary: 233 mg/dL — ABNORMAL HIGH (ref 65–99)
Glucose-Capillary: 157 mg/dL — ABNORMAL HIGH (ref 65–99)

## 2017-10-11 MED ORDER — GI COCKTAIL ~~LOC~~
30.0000 mL | Freq: Once | ORAL | Status: AC
Start: 2017-10-11 — End: 2017-10-11
  Administered 2017-10-11: 30 mL via ORAL
  Filled 2017-10-11: qty 30

## 2017-10-11 MED ORDER — SODIUM CHLORIDE 0.9 % IV BOLUS
1000.0000 mL | Freq: Once | INTRAVENOUS | Status: AC
  Administered 2017-10-11: 1000 mL via INTRAVENOUS

## 2017-10-11 MED ORDER — LORAZEPAM 1 MG PO TABS
1.0000 mg | ORAL_TABLET | Freq: Once | ORAL | Status: AC
  Administered 2017-10-11: 1 mg via ORAL
  Filled 2017-10-11: qty 1

## 2017-10-11 MED ORDER — GUAIFENESIN-CODEINE 100-10 MG/5ML PO SYRP: 5.0000 mL | ORAL_SOLUTION | Freq: Three times a day (TID) | ORAL | 0 refills | Status: DC | PRN

## 2017-10-11 MED FILL — !VENTOLIN HFA INHALER: 108 (90 BAS | 25 days supply | Qty: 18 | Fill #0

## 2017-10-11 NOTE — ED Triage Notes (Signed)
Pt complaint of continued cough since end of last week; generalized chest pain worse with cough and hyperglycemia; compliant with medication for diabetes.

## 2017-10-11 NOTE — ED Provider Notes (Signed)
[facility] DEPT Provider Note   CSN: [id number] Arrival date & time: 10/11/17  1135     History   Chief Complaint Chief Complaint  Patient presents with  . Multiple Complaints    HPI Courtney Zamora is a 32 y.o. female with a past medical history of obesity, hypertension, insulin-dependent diabetes, severe needle phobia, who presents to ED for evaluation of continued cough productive with green sputum for the past 5 months and right-sided lower chest pain worse with coughing and deep inspiration.  States this feels similar to the last time that she had pneumonia and had to be hospitalized.  She has been seen and evaluated at this ED and urgent care several times in the past several months for similar symptoms.  Since her hospitalization in November, she has not tried any other antibiotics for her symptoms.  Prior to her hospitalization in November, she was found to have new onset diabetes most likely due to her obesity and chronic steroid use.  She states that there is no improvement in her cough with Mucinex, Robitussin and Tessalon Perles.  She was seen and evaluated by her PCP this morning for follow-up with her insulin regimen and was told to come to the ED due to her high blood sugars and ongoing cough as well as bilateral lower extremity edema..  She denies any hemoptysis, fevers, abdominal pain, vomiting, prior MI, DVT or PE, recent surgeries, recent prolonged travel, OCP use.  HPI  Past Medical History:  Diagnosis Date  . Anxiety   . Asthma   . Depression   . Essential hypertension   . GERD (gastroesophageal reflux disease)   . Obesity   . Osteoarthritis, knee   . Seizures ([place])   . Severe needle phobia     Patient Active Problem List   Diagnosis Date Noted  . Abnormal CT scan, esophagus 09/19/2017  . Dysphagia 09/19/2017  . Elevated LFTs 09/19/2017  . Newly diagnosed diabetes ([place]) 09/15/2017  . Hepatic steatosis 09/15/2017  . DM2  (diabetes mellitus, type 2) ([place]) 09/06/2017  . SOB (shortness of breath) 09/06/2017  . CAP (community acquired pneumonia) 08/03/2017  . Acute frontal sinusitis 12/15/2016  . Metatarsal stress fracture, right, sequela 11/17/2016  . Neck muscle spasm 10/05/2016  . Nipple discharge in female 09/13/2016  . Depression 08/17/2016  . Anxiety 08/17/2016  . ASCUS with positive high risk HPV cervical 08/14/2016  . Hypertension 08/14/2016  . Long term (current) use of systemic steroids 08/14/2016  . Esophageal reflux 12/29/2015  . Morbid (severe) obesity due to excess calories ([place]) 12/29/2015  . Chronic pain of right knee 12/29/2015  . Chronic pain of left ankle 12/29/2015  . 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 Asthma, moderate persistent 12/09/2015    Past Surgical History:  Procedure Laterality Date  . TONSILLECTOMY       OB History    Gravida  1   Para      Term      Preterm      AB  1   Living  0     SAB  1   TAB      Ectopic      Multiple      Live Births               Home Medications    Prior to Admission medications   Medication Sig Start Date End Date Taking? Authorizing Provider  albuterol (PROVENTIL HFA;VENTOLIN HFA) 108 (90 Base) MCG/ACT inhaler  Inhale 2 puffs into the lungs every 6 (six) hours as needed for wheezing or shortness of breath (wheezing). For shortness of breath. 10/10/17  Yes Gildardo Pounds, NP  albuterol (PROVENTIL) (2.5 MG/3ML) 0.083% nebulizer solution Take 3 mLs (2.5 mg total) by nebulization every 6 (six) hours as needed for wheezing or shortness of breath. 11/17/16  Yes Funches, Josalyn, MD  budesonide-formoterol (SYMBICORT) 160-4.5 MCG/ACT inhaler Inhale 2 puffs into the lungs 2 (two) times daily. 03/18/16  Yes Funches, Josalyn, MD  Calcium Citrate 200 MG TABS Take 2 tablets (400 mg total) by mouth every other day. Patient taking differently: Take 400 mg by mouth at bedtime.  10/05/16  Yes Rowe Clack, MD  cetirizine (ZYRTEC)  10 MG tablet Take 1 tablet (10 mg total) by mouth daily. 11/22/16  Yes Elsie Stain, MD  Cholecalciferol (VITAMIN D3) 5000 units CAPS Take 5,000 Units by mouth daily.    Yes [provider]  diclofenac (VOLTAREN) 75 MG EC tablet Take 1 tablet (75 mg total) by mouth 2 (two) times daily as needed. Patient taking differently: Take 75 mg by mouth 2 (two) times daily as needed for mild pain.  05/10/17  Yes Jessy Oto, MD  diclofenac sodium (VOLTAREN) 1 % GEL Apply 2 g topically 4 (four) times daily. Patient taking differently: Apply 2 g topically 4 (four) times daily as needed (pain).  05/04/17  Yes Jessy Oto, MD  DULoxetine (CYMBALTA) 30 MG capsule Take 1 capsule (30 mg total) by mouth 2 (two) times daily. 08/15/17  Yes Ladell Pier, MD  furosemide (LASIX) 20 MG tablet TAKE 1 TABLET (20 MG TOTAL) BY MOUTH DAILY AS NEEDED. Patient taking differently: Take 20 mg by mouth daily as needed for fluid.  07/14/17  Yes Ladell Pier, MD  gabapentin (NEURONTIN) 300 MG capsule 1 tab PO Qa.m and noon and 2 tabs Q p.m Patient taking differently: Take 300-600 mg by mouth 2 (two) times daily. 1 tab PO Qa.m and noon and 2 tabs Q p.m 05/18/17  Yes Ladell Pier, MD  hydrochlorothiazide (HYDRODIURIL) 25 MG tablet Take 1 tablet (25 mg total) by mouth daily. 10/02/17  Yes Ladell Pier, MD  hydrOXYzine (ATARAX/VISTARIL) 10 MG tablet Take 1 tablet (10 mg total) by mouth 3 (three) times daily as needed for anxiety. 09/08/17  Yes Florencia Reasons, MD  insulin aspart (NOVOLOG) 100 UNIT/ML injection Insulin sliding scale: Blood sugar  120-150   3units                       151-200   4units                       201-250   7units                       251- 300  11units                       301-350   15uints                       351-400   20units                       >400         call MD immediately 09/21/17  Yes Ladell Pier, MD  insulin glargine (LANTUS) 100  UNIT/ML injection Inject 0.27  mLs (27 Units total) into the skin at bedtime. Patient taking differently: Inject 31 Units into the skin at bedtime.  10/02/17  Yes Ladell Pier, MD  ipratropium-albuterol (DUONEB) 0.5-2.5 (3) MG/3ML SOLN Take 3 mLs by nebulization every 6 (six) hours as needed. Patient taking differently: Take 3 mLs by nebulization every 6 (six) hours as needed. Wheezing 07/28/17  Yes Mannam, Praveen, MD  LORazepam (ATIVAN) 1 MG tablet Take 1 tablet (1 mg total) by mouth as needed for anxiety (30 minutes before procedure). 09/19/17  Yes Zehr, Laban Emperor, PA-C  metFORMIN (GLUCOPHAGE-XR) 500 MG 24 hr tablet 2 tabs PO Q a.m and 1 tab Q p.m 10/02/17  Yes Ladell Pier, MD  montelukast (SINGULAIR) 10 MG tablet Take 1 tablet (10 mg total) by mouth daily. 08/09/17 08/09/18 Yes Mannam, Hart Robinsons, MD  Multiple Vitamins-Minerals (WOMENS MULTI) CAPS Take 1 capsule by mouth daily.   Yes [provider]  pantoprazole (PROTONIX) 40 MG tablet Take 1 tablet (40 mg total) by mouth 2 (two) times daily before a meal. 09/19/17  Yes Zehr, Laban Emperor, PA-C  promethazine (PHENERGAN) 25 MG tablet Take 1 tablet (25 mg total) by mouth every 8 (eight) hours as needed for nausea or vomiting. 11/17/16  Yes Funches, Josalyn, MD  tiZANidine (ZANAFLEX) 4 MG tablet TAKE 1 TABLET BY MOUTH EVERY 6 HOURS AS NEEDED FOR MUSCLE SPASMS. 09/13/17  Yes Ladell Pier, MD  amoxicillin-clavulanate (AUGMENTIN) 875-125 MG tablet Take 1 tablet by mouth 2 (two) times daily. 10/09/17   Marshell Garfinkel, MD  blood glucose meter kit and supplies KIT Dispense based on patient and insurance preference. Use up to four times daily as directed. (FOR ICD-9 250.00, 250.01). 09/08/17   Florencia Reasons, MD  glucose blood (TRUE METRIX BLOOD GLUCOSE TEST) test strip Check sugar 3-4 times a day before meals 09/28/17   Ladell Pier, MD  guaiFENesin (MUCINEX) 600 MG 12 hr tablet Take 1 tablet (600 mg total) by mouth 2 (two) times daily. Patient not taking: Reported on 10/11/2017  09/08/17   Florencia Reasons, MD  guaiFENesin-codeine [facility]) 100-10 MG/5ML syrup Take 5 mLs by mouth 3 (three) times daily as needed for cough. 10/11/17   Naylani Bradner, PA-C  predniSONE (DELTASONE) 20 MG tablet 3 tabs daily for 1 week then 2 tabs daily for 1 week and then 1 tab daily. Patient not taking: Reported on 10/11/2017 10/09/17   Shelda Pal, DO  Respiratory Therapy Supplies (FLUTTER) [place] As directed. 07/28/17   Marshell Garfinkel, MD  Spacer/Aero-Holding Chambers (AEROCHAMBER MV) inhaler Use as instructed 01/25/17   Elsie Stain, MD  TRUEPLUS LANCETS 28G MISC Check sugar 3-4 times a day before meals 09/28/17   Ladell Pier, MD    Family History Family History  Problem Relation Age of Onset  . Hypothyroidism Mother   . Diabetes Father   . Diabetes Paternal Uncle   . Hypertension Maternal Grandmother   . Hypertension Maternal Grandfather   . Pancreatic cancer Maternal Grandfather   . Hypertension Paternal Grandmother   . Heart disease Paternal Grandfather   . Hypertension Paternal Grandfather     Social History Social History   Tobacco Use  . Smoking status: Never Smoker  . Smokeless tobacco: Never Used  Substance Use Topics  . Alcohol use: No  . Drug use: No     Allergies   Bee venom; Cinnamon; Doxycycline; Ibuprofen; Lexapro [escitalopram oxalate]; and Tape   Review of Systems  Review of Systems  Constitutional: Negative for appetite change, chills and fever.  HENT: Negative for ear pain, rhinorrhea, sneezing and sore throat.   Eyes: Negative for photophobia and visual disturbance.  Respiratory: Positive for cough and shortness of breath. Negative for chest tightness and wheezing.   Cardiovascular: Positive for chest pain and leg swelling. Negative for palpitations.  Gastrointestinal: Negative for abdominal pain, blood in stool, constipation, diarrhea, nausea and vomiting.  Genitourinary: Negative for dysuria, hematuria and urgency.    Musculoskeletal: Negative for myalgias.  Skin: Negative for rash.  Neurological: Negative for dizziness, weakness and light-headedness.     Physical Exam Updated Vital Signs BP 99/83 (BP Location: Right Arm)   Pulse (!) 106   Temp 98.1 F (36.7 C) (Oral)   Resp 18   Wt (!) 185.5 kg (409 lb)   LMP 06/02/2017   SpO2 94%   BMI 70.20 kg/m   Physical Exam  Constitutional: She appears well-developed and well-nourished. No distress.  Obese female. NAD. Speaking in complete sentences without difficulty.  HENT:  Head: Normocephalic and atraumatic.  Nose: Nose normal.  Eyes: Conjunctivae and EOM are normal. Right eye exhibits no discharge. Left eye exhibits no discharge. No scleral icterus.  Neck: Normal range of motion. Neck supple.  Cardiovascular: Regular rhythm, normal heart sounds and intact distal pulses. Tachycardia present. Exam reveals no gallop and no friction rub.  No murmur heard. Pulmonary/Chest: Effort normal. No respiratory distress. She has rhonchi in the right lower field. She has no rales. She exhibits bony tenderness.    Abdominal: Soft. Bowel sounds are normal. She exhibits no distension. There is no tenderness. There is no guarding.  Musculoskeletal: Normal range of motion. She exhibits edema.  1+ pitting edema symmetrically in bilateral lower extremities.  No calf tenderness noted.  Neurological: She is alert. She exhibits normal muscle tone. Coordination normal.  Skin: Skin is warm and dry. No rash noted.  Psychiatric: She has a normal mood and affect.  Nursing note and vitals reviewed.    ED Treatments / Results  Labs (all labs ordered are listed, but only abnormal results are displayed) Labs Reviewed  BASIC METABOLIC PANEL - Abnormal; Notable for the following components:      Result Value   Chloride 100 (*)    Glucose, Bld 174 (*)    All other components within normal limits  CBC - Abnormal; Notable for the following components:   WBC 11.4 (*)     Hemoglobin 11.3 (*)    MCH 25.3 (*)    MCHC 29.4 (*)    RDW 16.0 (*)    All other components within normal limits  URINALYSIS, ROUTINE W REFLEX MICROSCOPIC - Abnormal; Notable for the following components:   Ketones, ur 5 (*)    All other components within normal limits  CBG MONITORING, ED - Abnormal; Notable for the following components:   Glucose-Capillary 233 (*)    All other components within normal limits  CBG MONITORING, ED - Abnormal; Notable for the following components:   Glucose-Capillary 157 (*)    All other components within normal limits  I-STAT TROPONIN, ED  I-STAT BETA HCG BLOOD, ED ([facility], [facility], [facility] ONLY)  CBG MONITORING, ED  I-STAT TROPONIN, ED    EKG EKG Interpretation  Date/Time:  Wednesday October 11 2017 12:26:09 EDT Ventricular Rate:  110 PR Interval:    QRS Duration: 88 QT Interval:  331 QTC Calculation: 448 R Axis:   40 Text Interpretation:  Sinus tachycardia Baseline wander  in lead(s) V5 similar to prior 2/19 Confirmed by Aletta Edouard [phone]) on 10/11/2017 4:25:28 PM   Radiology Dg Chest 2 View  Result Date: 10/11/2017 CLINICAL DATA:  Chest pain and cough EXAM: CHEST - 2 VIEW COMPARISON:  October 09, 2017 FINDINGS: There is no edema or consolidation. The heart size and pulmonary vascularity are normal. No adenopathy. No pneumothorax. No bone lesions. IMPRESSION: No edema or consolidation. Electronically Signed   By: Lowella Grip III M.D.   On: 10/11/2017 13:18    Procedures Procedures (including critical care time)  Medications Ordered in ED Medications  LORazepam (ATIVAN) tablet 1 mg (1 mg Oral Given 10/11/17 1659)  sodium chloride 0.9 % bolus 1,000 mL (0 mLs Intravenous Stopped 10/11/17 2120)  gi cocktail (Maalox,Lidocaine,Donnatal) (30 mLs Oral Given 10/11/17 2120)     Initial Impression / Assessment and Plan / ED Course  I have reviewed the triage vital signs and the nursing notes.  Pertinent labs & imaging results that were available during  my care of the patient were reviewed by me and considered in my medical decision making (see chart for details).     Patient presents to ED for evaluation of continued cough productive with green sputum for the past 5 months.  She also reports right-sided lower chest pain worse with coughing.  This is reproducible with palpation.  She states that she most likely has pneumonia which she has had in the past.  She was diagnosed with new onset diabetes approximately 1 month ago and has been compliant with her insulin.  She was seen and evaluated by her PCP this morning.  She denies any hemoptysis, fevers, prior MI, DVT or PE, recent surgeries, recent prolonged travel or OCP use.  CBC with improving leukocytosis. BMP unremarkable. Troponin and delta troponin both negative.  EKG with tracing similar to prior.  His chest x-ray with no evidence of pneumonia or other abnormality.  HCG negative.  Suspect that her symptoms could possibly due to reflux.  Patient reports improvement in her symptoms with GI cocktail.  She is on 2 different reflux medications.  She did not cough at all during my examination or during my several visits to her room.  She is resting comfortably on my examination.  She has not hypoxic.  She is afebrile.  Will give patient antitussives and advised her to follow-up with her pulmonologist for further evaluation.  Patient recently underwent CTA to evaluate for PE about a month ago which was negative.  Her symptoms have been persistent and unchanged for the past 5 months so I doubt cardiac or pulmonary etiology of her symptoms. Strict return precautions given.  Portions of this note were generated with [employer]. Dictation errors may occur despite best attempts at proofreading.   Final Clinical Impressions(s) / ED Diagnoses   Final diagnoses:  Cough, persistent    ED Discharge Orders        Ordered    guaiFENesin-codeine [facility]) 100-10 MG/5ML syrup  3 times daily  PRN     10/11/17 2229       Delia Heady, PA-C 10/11/17 2237    Hayden Rasmussen, MD 10/12/17 1210

## 2017-10-11 NOTE — ED Notes (Signed)
Attempted an IV x 2 with non success.

## 2017-10-11 NOTE — ED Notes (Signed)
Patient is aware a urine sample is needed but would like to get IV fluids infused first.

## 2017-10-11 NOTE — ED Notes (Signed)
Administered Ativan for her fear of needles and will wait about 20-30 minutes for the medication to take effect to help calm her fears.

## 2017-10-11 NOTE — Progress Notes (Signed)
    S:     No chief complaint on file.   Patient arrives for diabetes evaluation, education, and management.  Patient reports adherence with medications.  Current diabetes medications include: Lantus 31 units dail (still titrating up), Novolog sliding scale, metformin XL 500 mg 2 tabs in the morning and 1 at night.  Patient denies hypoglycemic events.  Patient brings with her a log book. She has post-prandials in the 200s and fastings down to the 150s but has trended up the last few days to 180s-200s. She is still titrating the Lantus and has felt comfortable doing that.   Patient reports that she went to urgent care yesterday for what she believes is recurrent pneumonia. She was told it was an asthma exacerbation. She does not feel any better today. She believes she should be seen in the hospital. She would like [place] to directly admit her. She denies fevers but reports nasal congestion and shortness of breath which she has treated with albuterol. She did pick up her prednisone as prescribed from urgent care as well as an abx (Augmentin) that she just started 10/09/17.  Of note, patient reports that she did not want to go to the ED yesterday because she knew she had an appt with me to discuss her diabetes and she didn't want to miss it.   O:  Physical Exam   ROS   Lab Results  Component Value Date   HGBA1C 9.6 (H) 09/05/2017   There were no vitals filed for this visit.   A/P: Diabetes longstanding currently uncontrolled based on A1c of 9.6 and home CBGs. Patient denies hypoglycemic events and is able to verbalize appropriate hypoglycemia management plan. Patient reports adherence with medication. Control is suboptimal due to sedentary lifestyle.  Continue titrating Lantus as directed. Patient verbalized plan and knows to stop titrating the Lantus once she has fastings <130 per Dr. Laural Benes. The recent spike in her fastings might be from inflammation from asthma exacerbation so  patient will monitor for any hypoglycemia once inflammation is resolved.  Discussed with patient that we do not have any direct admitting privileges and that she would have to be evaluated in the ED prior to being admitted to the hospital. Also discussed the it can take time for abx and steroids to start to work so she may not feel better yet because the medications haven't been in her system long enough for her to notice an improvement. She appears stable in clinic, no SOB or cough, in clinic but does have some bilateral lower extremity edema.  Patient still adamant that it is recurrent pneumonia and she needs to be in the hospital. Offered to call ambulance for transport to hospital but patient and spouse refused and said they would just drive over for further evaluation. Discussed with patient that it is important that if she feels like she has any life threatening condition, that she needs to call 911 or go to the ED rather than waiting to be seen in clinic. Patient verbalized understanding.   At the completion of this note, I see that patient has checked in to the ED at [facility].   Next A1C anticipated May 2019.    Written patient instructions provided.  Total time in face to face counseling 15 minutes.   Follow up in Pharmacist Clinic Visit PRN, likely 1-2 weeks from now when she feels better.

## 2017-10-11 NOTE — ED Notes (Signed)
Patient request labs to be drawn when placed in a room.

## 2017-10-12 NOTE — Telephone Encounter (Signed)
No, I had only given her two tablets.  She was supposed to take one prior to her blood draw for labs and the other prior to getting her procedure for needle phobia.

## 2017-10-16 ENCOUNTER — Encounter: Payer: Self-pay | Admitting: Internal Medicine

## 2017-10-16 ENCOUNTER — Telehealth: Payer: Self-pay | Admitting: Internal Medicine

## 2017-10-16 ENCOUNTER — Other Ambulatory Visit: Payer: Self-pay | Admitting: Internal Medicine

## 2017-10-16 MED ORDER — MUPIROCIN 2 % EX OINT: TOPICAL_OINTMENT | CUTANEOUS | 0 refills | Status: DC

## 2017-10-16 NOTE — Telephone Encounter (Signed)
Patient called and stated she wanted to inform pcp nurse and pcp of her fluid retention and blood sugars being over 400 yesterday and over 300 today. Patient also stated she sent pcp  a picture of her leg, and that if pcp would look at it as well.Please fyu at your earliest convenience.

## 2017-10-16 NOTE — Telephone Encounter (Signed)
Will forward to pcp

## 2017-10-16 NOTE — Telephone Encounter (Signed)
Responded to pt via Mychart

## 2017-10-17 ENCOUNTER — Encounter (INDEPENDENT_AMBULATORY_CARE_PROVIDER_SITE_OTHER): Payer: Self-pay | Admitting: Specialist

## 2017-10-17 MED FILL — MUPIROCIN 2% OINTMENT: 2 | 10 days supply | Qty: 22 | Fill #0

## 2017-10-19 ENCOUNTER — Other Ambulatory Visit: Payer: Self-pay | Admitting: Internal Medicine

## 2017-10-19 DIAGNOSIS — G894 Chronic pain syndrome: Secondary | ICD-10-CM

## 2017-10-19 MED FILL — GABAPENTIN 300 MG CAPSULE: 300 | 30 days supply | Qty: 120 | Fill #0 | Status: TO

## 2017-10-20 ENCOUNTER — Ambulatory Visit: Payer: Medicaid Other | Attending: Internal Medicine | Admitting: Internal Medicine

## 2017-10-20 ENCOUNTER — Other Ambulatory Visit (INDEPENDENT_AMBULATORY_CARE_PROVIDER_SITE_OTHER): Payer: Self-pay | Admitting: Specialist

## 2017-10-20 ENCOUNTER — Encounter: Payer: Self-pay | Admitting: Internal Medicine

## 2017-10-20 ENCOUNTER — Other Ambulatory Visit: Payer: Self-pay

## 2017-10-20 VITALS — BP 157/91 | HR 115 | Temp 97.7°F | Resp 16 | Wt >= 6400 oz

## 2017-10-20 DIAGNOSIS — Z79899 Other long term (current) drug therapy: Secondary | ICD-10-CM | POA: Insufficient documentation

## 2017-10-20 DIAGNOSIS — Z881 Allergy status to other antibiotic agents status: Secondary | ICD-10-CM | POA: Insufficient documentation

## 2017-10-20 DIAGNOSIS — R5381 Other malaise: Secondary | ICD-10-CM | POA: Diagnosis not present

## 2017-10-20 DIAGNOSIS — J4540 Moderate persistent asthma, uncomplicated: Secondary | ICD-10-CM | POA: Diagnosis not present

## 2017-10-20 DIAGNOSIS — F419 Anxiety disorder, unspecified: Secondary | ICD-10-CM | POA: Insufficient documentation

## 2017-10-20 DIAGNOSIS — G4733 Obstructive sleep apnea (adult) (pediatric): Secondary | ICD-10-CM

## 2017-10-20 DIAGNOSIS — L97912 Non-pressure chronic ulcer of unspecified part of right lower leg with fat layer exposed: Secondary | ICD-10-CM

## 2017-10-20 DIAGNOSIS — J209 Acute bronchitis, unspecified: Secondary | ICD-10-CM | POA: Diagnosis not present

## 2017-10-20 DIAGNOSIS — R1310 Dysphagia, unspecified: Secondary | ICD-10-CM | POA: Insufficient documentation

## 2017-10-20 DIAGNOSIS — Z888 Allergy status to other drugs, medicaments and biological substances status: Secondary | ICD-10-CM | POA: Insufficient documentation

## 2017-10-20 DIAGNOSIS — I10 Essential (primary) hypertension: Secondary | ICD-10-CM

## 2017-10-20 DIAGNOSIS — Z794 Long term (current) use of insulin: Secondary | ICD-10-CM | POA: Insufficient documentation

## 2017-10-20 DIAGNOSIS — M130 Polyarthritis, unspecified: Secondary | ICD-10-CM

## 2017-10-20 DIAGNOSIS — E1165 Type 2 diabetes mellitus with hyperglycemia: Secondary | ICD-10-CM | POA: Insufficient documentation

## 2017-10-20 DIAGNOSIS — Z886 Allergy status to analgesic agent status: Secondary | ICD-10-CM | POA: Insufficient documentation

## 2017-10-20 DIAGNOSIS — K219 Gastro-esophageal reflux disease without esophagitis: Secondary | ICD-10-CM | POA: Insufficient documentation

## 2017-10-20 DIAGNOSIS — R945 Abnormal results of liver function studies: Secondary | ICD-10-CM

## 2017-10-20 DIAGNOSIS — F329 Major depressive disorder, single episode, unspecified: Secondary | ICD-10-CM | POA: Insufficient documentation

## 2017-10-20 DIAGNOSIS — R000 Tachycardia, unspecified: Secondary | ICD-10-CM | POA: Insufficient documentation

## 2017-10-20 DIAGNOSIS — M1710 Unilateral primary osteoarthritis, unspecified knee: Secondary | ICD-10-CM | POA: Insufficient documentation

## 2017-10-20 DIAGNOSIS — IMO0001 Reserved for inherently not codable concepts without codable children: Secondary | ICD-10-CM

## 2017-10-20 DIAGNOSIS — Z6841 Body Mass Index (BMI) 40.0 and over, adult: Secondary | ICD-10-CM | POA: Insufficient documentation

## 2017-10-20 DIAGNOSIS — R7989 Other specified abnormal findings of blood chemistry: Secondary | ICD-10-CM

## 2017-10-20 DIAGNOSIS — R600 Localized edema: Secondary | ICD-10-CM

## 2017-10-20 DIAGNOSIS — Z7951 Long term (current) use of inhaled steroids: Secondary | ICD-10-CM | POA: Insufficient documentation

## 2017-10-20 DIAGNOSIS — G8929 Other chronic pain: Secondary | ICD-10-CM | POA: Insufficient documentation

## 2017-10-20 LAB — GLUCOSE, POCT (MANUAL RESULT ENTRY): POC GLUCOSE: 185 mg/dL — AB (ref 70–99)

## 2017-10-20 MED ORDER — INSULIN ASPART 100 UNIT/ML ~~LOC~~ SOLN: 15.0000 [IU] | Freq: Three times a day (TID) | SUBCUTANEOUS | 11 refills | Status: DC

## 2017-10-20 MED ORDER — DICLOFENAC SODIUM 75 MG PO TBEC: 75.0000 mg | DELAYED_RELEASE_TABLET | Freq: Two times a day (BID) | ORAL | 2 refills | Status: DC | PRN

## 2017-10-20 MED ORDER — INSULIN GLARGINE 100 UNIT/ML ~~LOC~~ SOLN: 37.0000 [IU] | Freq: Every day | SUBCUTANEOUS | 4 refills | Status: DC

## 2017-10-20 MED ORDER — FUROSEMIDE 20 MG PO TABS: ORAL_TABLET | ORAL | 2 refills | Status: DC

## 2017-10-20 MED ORDER — LIRAGLUTIDE 18 MG/3ML ~~LOC~~ SOPN: PEN_INJECTOR | SUBCUTANEOUS | 2 refills | Status: DC

## 2017-10-20 MED ORDER — PEN NEEDLES 31G X 8 MM MISC: 3 refills | Status: DC

## 2017-10-20 MED FILL — ?PANTOPRAZOLE SO DR 40MG TA: 40 | 30 days supply | Qty: 60 | Fill #1

## 2017-10-20 MED FILL — DICLOFENAC SODIUM 75 MG TAB: 75 | 30 days supply | Qty: 60 | Fill #0

## 2017-10-20 MED FILL — TRUE METRIX TEST STRIP: 25 days supply | Qty: 100 | Fill #1

## 2017-10-20 MED FILL — !LANTUS 100 UNITS/ML VIAL: 100 | 27 days supply | Qty: 10 | Fill #0

## 2017-10-20 MED FILL — !NOVOLOG 100UNITS/ML VIAL: 100/ML | 22 days supply | Qty: 10 | Fill #0

## 2017-10-20 MED FILL — TRUEPLUS PEN NDL 31GX5/16": 31G X 8 MM | 30 days supply | Qty: 100 | Fill #0 | Status: TO

## 2017-10-20 MED FILL — ?DULOXETINE HCL 30 MG CPEP: 30 | 30 days supply | Qty: 60 | Fill #1

## 2017-10-20 MED FILL — TRUEplus LANCETS 28G MISC: 25 days supply | Qty: 100 | Fill #1

## 2017-10-20 MED FILL — ?TIZANIDINE HCL 4MG TABLETS: 4 | 15 days supply | Qty: 60 | Fill #2

## 2017-10-20 MED FILL — TRUEPLUS PEN NDL 31GX5/16: 31G X 8 MM | 30 days supply | Qty: 100 | Fill #0 | Status: TO

## 2017-10-20 MED FILL — ?FUROSEMIDE 20 MG TABLET: 20 | 30 days supply | Qty: 15 | Fill #0

## 2017-10-20 MED FILL — **VICTOZA 18 MG/3 ML INJECT: 18 | 18 days supply | Qty: 3 | Fill #0

## 2017-10-20 NOTE — Patient Instructions (Signed)
Increase NovoLog to 15 units with meals. Start Victoza.  Continue to monitor blood sugars.  Change Lasix to 20 mg every other day.  Keep legs elevated.  Do dressing changes on the right lower leg daily using sterile water and Bactroban ointment.

## 2017-10-20 NOTE — Addendum Note (Signed)
Addended by: Malin Sambrano, JAY'A R on: 10/20/2017 10:41 AM   Modules accepted: Orders  

## 2017-10-20 NOTE — Addendum Note (Signed)
Addended by: Carolynne Edouard R on: 10/20/2017 10:41 AM   Modules accepted: Orders

## 2017-10-20 NOTE — Progress Notes (Signed)
Patient ID: Courtney Zamora, female    DOB: 08-06-1985  MRN: [id number]  CC: SOB, cough, blisters on legs  Subjective: Courtney Zamora is a 32 y.o. female who presents for UC visit.  Husband is with her. Her concerns today include:  Patient with history of moderate persistent asthma, morbid obesity, hypertension, depression/anxiety, chronic knee pain due to on meniscus tearand osteoarthritis based on MRI done 04/2016, on pain management agreement for Tylenol #3, OSA.  1.  Since last visit with me patient has been seen at urgent care and in the emergency room for cough and shortness of breath.  She is concerned that she is developing pneumonia again.  She had  called her pulmonologist and he had given a prescription for Augmentin, she has 2 days left.  She has last dose of prednisone today.  She has had 2 CXRs done in the past week which did not reveal any acute infiltrates.  Using DuoNeb's every 6 hours.  Has a follow-up appointment with the pulmonologist on 11/13/2017. -Patient reports that she still has a cough that is sometimes productive of yellow phlegm.  No fever.  SOB very easy which she reports has been the same since about November of last year.  She can only do a limited amount of housework before having to sit down.  The same thing goes for walking.  -She saw or read something recently about pulmonary hypertension  and wondered whether she may have that condition.  However she did have an echo in January of this year that revealed normal EF and pulmonary pressure  2.  Has had some blisters on the right lower leg which she gets every year around about this time. All have ruptured and one has turned into a small ulcer.  She has been cleaning the area with peroxide and applying antibiotic cream.  She has some lower extremity edema.  She has furosemide for use PRN.  She had sent me a My Chart message recently and I advised that she take the furosemide daily for the next 3 days which she had  already done.   Some swelling in hands. Limits salt.  Taking HCTZ which was inc on last visit   3.  DM:   Med:  Up to 37 units on Lantus, Novolog 10 units and Metformin as prescribed BS: checks BS QID and has log for review range 200-300s.  Her morning blood sugars was down into the upper 100s before she started prednisone  4.  OSA:  Had sleep study ordered by pulmonary. Has OSA.  She reports that they are working with her to help get CPAP.  Never did blood test that were orderd on last visit.  She thought they drew them when she was in ER recently to save her another stick Patient Active Problem List   Diagnosis Date Noted  . Abnormal CT scan, esophagus 09/19/2017  . Dysphagia 09/19/2017  . Elevated LFTs 09/19/2017  . Newly diagnosed diabetes ([place]) 09/15/2017  . Hepatic steatosis 09/15/2017  . DM2 (diabetes mellitus, type 2) ([place]) 09/06/2017  . SOB (shortness of breath) 09/06/2017  . CAP (community acquired pneumonia) 08/03/2017  . Acute frontal sinusitis 12/15/2016  . Metatarsal stress fracture, right, sequela 11/17/2016  . Neck muscle spasm 10/05/2016  . Nipple discharge in female 09/13/2016  . Depression 08/17/2016  . Anxiety 08/17/2016  . ASCUS with positive high risk HPV cervical 08/14/2016  . Hypertension 08/14/2016  . Long term (current) use of systemic steroids 08/14/2016  .  Esophageal reflux 12/29/2015  . Morbid (severe) obesity due to excess calories ([place]) 12/29/2015  . Chronic pain of right knee 12/29/2015  . Chronic pain of left ankle 12/29/2015  . 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 Asthma, moderate persistent 12/09/2015     Current Outpatient Medications on File Prior to Visit  Medication Sig Dispense Refill  . albuterol (PROVENTIL HFA;VENTOLIN HFA) 108 (90 Base) MCG/ACT inhaler Inhale 2 puffs into the lungs every 6 (six) hours as needed for wheezing or shortness of breath (wheezing). For shortness of breath. 1 Inhaler 0  . albuterol (PROVENTIL) (2.5 MG/3ML)  0.083% nebulizer solution Take 3 mLs (2.5 mg total) by nebulization every 6 (six) hours as needed for wheezing or shortness of breath. 150 mL 1  . amoxicillin-clavulanate (AUGMENTIN) 875-125 MG tablet Take 1 tablet by mouth 2 (two) times daily. 14 tablet 0  . blood glucose meter kit and supplies KIT Dispense based on patient and insurance preference. Use up to four times daily as directed. (FOR ICD-9 250.00, 250.01). 1 each 0  . budesonide-formoterol (SYMBICORT) 160-4.5 MCG/ACT inhaler Inhale 2 puffs into the lungs 2 (two) times daily. 3 Inhaler 3  . Calcium Citrate 200 MG TABS Take 2 tablets (400 mg total) by mouth every other day. (Patient taking differently: Take 400 mg by mouth at bedtime. )    . cetirizine (ZYRTEC) 10 MG tablet Take 1 tablet (10 mg total) by mouth daily. 30 tablet 11  . Cholecalciferol (VITAMIN D3) 5000 units CAPS Take 5,000 Units by mouth daily.     . diclofenac sodium (VOLTAREN) 1 % GEL Apply 2 g topically 4 (four) times daily. (Patient taking differently: Apply 2 g topically 4 (four) times daily as needed (pain). ) 3 Tube 3  . DULoxetine (CYMBALTA) 30 MG capsule Take 1 capsule (30 mg total) by mouth 2 (two) times daily. 60 capsule 6  . gabapentin (NEURONTIN) 300 MG capsule TAKE ONE CAPSULE BY MOUTH IN THE MORNING, ONE CAPSULE AT NOON AND TWO CAPSULES AT BEDTIME. 120 capsule 4  . glucose blood (TRUE METRIX BLOOD GLUCOSE TEST) test strip Check sugar 3-4 times a day before meals 100 each 12  . guaiFENesin-codeine (ROBITUSSIN AC) 100-10 MG/5ML syrup Take 5 mLs by mouth 3 (three) times daily as needed for cough. 50 mL 0  . hydrochlorothiazide (HYDRODIURIL) 25 MG tablet Take 1 tablet (25 mg total) by mouth daily. 90 tablet 3  . hydrOXYzine (ATARAX/VISTARIL) 10 MG tablet Take 1 tablet (10 mg total) by mouth 3 (three) times daily as needed for anxiety. 30 tablet 0  . ipratropium-albuterol (DUONEB) 0.5-2.5 (3) MG/3ML SOLN Take 3 mLs by nebulization every 6 (six) hours as needed.  (Patient taking differently: Take 3 mLs by nebulization every 6 (six) hours as needed. Wheezing) 360 mL 5  . LORazepam (ATIVAN) 1 MG tablet Take 1 tablet (1 mg total) by mouth as needed for anxiety (30 minutes before procedure). 2 tablet 0  . metFORMIN (GLUCOPHAGE-XR) 500 MG 24 hr tablet 2 tabs PO Q a.m and 1 tab Q p.m 90 tablet 6  . montelukast (SINGULAIR) 10 MG tablet Take 1 tablet (10 mg total) by mouth daily. 30 tablet 5  . Multiple Vitamins-Minerals (WOMENS MULTI) CAPS Take 1 capsule by mouth daily.    . mupirocin ointment (BACTROBAN) 2 % Apply to the affected area BID 22 g 0  . pantoprazole (PROTONIX) 40 MG tablet Take 1 tablet (40 mg total) by mouth 2 (two) times daily before a meal. 60 tablet  3  . predniSONE (DELTASONE) 20 MG tablet 3 tabs daily for 1 week then 2 tabs daily for 1 week and then 1 tab daily. (Patient not taking: Reported on 10/11/2017) 42 tablet 0  . promethazine (PHENERGAN) 25 MG tablet Take 1 tablet (25 mg total) by mouth every 8 (eight) hours as needed for nausea or vomiting. 2060 tablet 2  . Respiratory Therapy Supplies (FLUTTER) DEVI As directed. 1 each 0  . Spacer/Aero-Holding Chambers (AEROCHAMBER MV) inhaler Use as instructed 1 each 0  . tiZANidine (ZANAFLEX) 4 MG tablet TAKE 1 TABLET BY MOUTH EVERY 6 HOURS AS NEEDED FOR MUSCLE SPASMS. 60 tablet 2  . TRUEPLUS LANCETS 28G MISC Check sugar 3-4 times a day before meals 100 each 12   No current facility-administered medications on file prior to visit.     Allergies  Allergen Reactions  . Bee Venom Anaphylaxis  . Cinnamon Anaphylaxis  . Doxycycline Hives  . Ibuprofen Other (See Comments)    Abdominal pain  . Lexapro [Escitalopram Oxalate]     Generalized body shaking. ? sz  . Tape Hives and Rash    EKG STICKERS give pt rash, hives    Social History   Socioeconomic History  . Marital status: Married    Spouse name: Not on file  . Number of children: Not on file  . Years of education: Not on file  . Highest  education level: Not on file  Occupational History  . Not on file  Social Needs  . Financial resource strain: Not on file  . Food insecurity:    Worry: Not on file    Inability: Not on file  . Transportation needs:    Medical: Not on file    Non-medical: Not on file  Tobacco Use  . Smoking status: Never Smoker  . Smokeless tobacco: Never Used  Substance and Sexual Activity  . Alcohol use: No  . Drug use: No  . Sexual activity: Yes    Birth control/protection: None  Lifestyle  . Physical activity:    Days per week: Not on file    Minutes per session: Not on file  . Stress: Not on file  Relationships  . Social connections:    Talks on phone: Not on file    Gets together: Not on file    Attends religious service: Not on file    Active member of club or organization: Not on file    Attends meetings of clubs or organizations: Not on file    Relationship status: Not on file  . Intimate partner violence:    Fear of current or ex partner: Not on file    Emotionally abused: Not on file    Physically abused: Not on file    Forced sexual activity: Not on file  Other Topics Concern  . Not on file  Social History Narrative  . Not on file    Family History  Problem Relation Age of Onset  . Hypothyroidism Mother   . Diabetes Father   . Diabetes Paternal Uncle   . Hypertension Maternal Grandmother   . Hypertension Maternal Grandfather   . Pancreatic cancer Maternal Grandfather   . Hypertension Paternal Grandmother   . Heart disease Paternal Grandfather   . Hypertension Paternal Grandfather     Past Surgical History:  Procedure Laterality Date  . TONSILLECTOMY      ROS: Review of Systems  PHYSICAL EXAM: BP (!) 157/91   Pulse (!) 115   Temp 97.7 F (  36.5 C) (Oral)   Resp 16   Wt (!) 407 lb 9.6 oz (184.9 kg)   SpO2 96%   BMI 69.96 kg/m   Repeat BP 120/70 with large cuff Pox 95-97% with ambulation Physical Exam  General appearance - obese young female in NAD.   Pt gets winded with ambulation Mental status - alert, oriented to person, place, and time, normal mood, behavior, speech, dress, motor activity, and thought processes Chest - clear to auscultation, no wheezes, rales or rhonchi, symmetric air entry Heart - tachycardiac, regular.  Dec once she was sitting for a while Extremities - trace to 1 + LE edema Skin: RLE: 1 cm shallow ulcer mid shin with exudative base.  Mild surrounding erythema  BS 185   ASSESSMENT AND PLAN: 1. Moderate persistent asthma, unspecified whether complicated 2. Acute bronchitis, unspecified organism Patient will complete the Augmentin and prednisone.  Continue current inhalers.  Her lungs sound quite clear today.  She gets SOB and tachycardic with exertion part of which I think due to deconditioning.    3. Diabetes mellitus type 2, uncontrolled, without complications (HCC) Increase Novolog to 15 units with meals.  Add victozia which should help with weight loss.  I went over how the medicine works and possible side effects of GI upset mainly nausea and possible vomiting from delayed stomach emptying.  Continue to encourage healthy eating habits - POCT glucose (manual entry) - Microalbumin / creatinine urine ratio - liraglutide (VICTOZA) 18 MG/3ML SOPN; 0.6 mg Subcut daily for 1 wk then increase to 1.2 mg  Dispense: 3 mL; Refill: 2 - insulin aspart (NOVOLOG) 100 UNIT/ML injection; Inject 15 Units into the skin 3 (three) times daily before meals.  Dispense: 10 mL; Refill: 11 - Insulin Pen Needle (PEN NEEDLES) 31G X 8 MM MISC; Use as directed  Dispense: 100 each; Refill: 3 - insulin glargine (LANTUS) 100 UNIT/ML injection; Inject 0.37 mLs (37 Units total) into the skin at bedtime.  Dispense: 10 mL; Refill: 4  4. Physical deconditioning -discuss referral for some P.T once she gets over this acute respiratory flare. Pt wants to hold off for now until she gets Medicaid which she hopes to have soon  5. OSA (obstructive sleep  apnea)   6. Morbid obesity ([place]) See #1 above.  Possible referral for some physical therapy soon - TSH  7. Edema of both legs Change furosemide to a standing dose of 20 mg every other day - furosemide (LASIX) 20 MG tablet; 1 tab PO every other day  Dispense: 30 tablet; Refill: 2  8. Ulcer of right lower extremity with fat layer exposed ([place]) Given sterile water and gauze.  Advised to do dressing changes daily by cleaning the also with sterile water, dabbing dry, applying some antibiotic ointment and then covering with gauze  9. Elevated LFTs Blood test as ordered by GI - Hepatitis B Surface AntiGEN - Hepatitis B DNA, ultraquantitative, PCR  10. Hypertension, unspecified type - Basic Metabolic Panel as ordered on last visit  11. Polyarthritis Labs as ordered on last visit - CYCLIC CITRUL PEPTIDE ANTIBODY, IGG/IGA - Rheumatoid factor - ANA w/Reflex if Positive - Sedimentation Rate  Patient was given the opportunity to ask questions.  Patient verbalized understanding of the plan and was able to repeat key elements of the plan.   Orders Placed This Encounter  Procedures  . Microalbumin / creatinine urine ratio  . TSH  . POCT glucose (manual entry)     Requested Prescriptions   Signed  Prescriptions Disp Refills  . liraglutide (VICTOZA) 18 MG/3ML SOPN 3 mL 2    Sig: 0.6 mg Subcut daily for 1 wk then increase to 1.2 mg  . furosemide (LASIX) 20 MG tablet 30 tablet 2    Sig: 1 tab PO every other day  . insulin aspart (NOVOLOG) 100 UNIT/ML injection 10 mL 11    Sig: Inject 15 Units into the skin 3 (three) times daily before meals.  . Insulin Pen Needle (PEN NEEDLES) 31G X 8 MM MISC 100 each 3    Sig: Use as directed  . insulin glargine (LANTUS) 100 UNIT/ML injection 10 mL 4    Sig: Inject 0.37 mLs (37 Units total) into the skin at bedtime.    F/u in 3 wks Karle Plumber, MD, Rosalita Chessman

## 2017-10-20 NOTE — Telephone Encounter (Signed)
Diclofenac refill request 

## 2017-10-21 LAB — MICROALBUMIN / CREATININE URINE RATIO
Creatinine, Urine: 186.5 mg/dL
MICROALB/CREAT RATIO: 38.8 mg/g{creat} — AB (ref 0.0–30.0)
Microalbumin, Urine: 72.3 ug/mL

## 2017-10-23 ENCOUNTER — Other Ambulatory Visit: Payer: Self-pay | Admitting: Internal Medicine

## 2017-10-23 DIAGNOSIS — M0520 Rheumatoid vasculitis with rheumatoid arthritis of unspecified site: Secondary | ICD-10-CM

## 2017-10-24 ENCOUNTER — Telehealth: Payer: Self-pay

## 2017-10-24 LAB — BASIC METABOLIC PANEL
BUN/Creatinine Ratio: 34 — ABNORMAL HIGH (ref 9–23)
BUN: 23 mg/dL — AB (ref 6–20)
CHLORIDE: 98 mmol/L (ref 96–106)
CO2: 23 mmol/L (ref 20–29)
CREATININE: 0.68 mg/dL (ref 0.57–1.00)
Calcium: 9.7 mg/dL (ref 8.7–10.2)
GFR calc Af Amer: 134 mL/min/{1.73_m2} (ref >59)
GFR calc non Af Amer: 116 mL/min/{1.73_m2} (ref >59)
GLUCOSE: 158 mg/dL — AB (ref 65–99)
Potassium: 4.1 mmol/L (ref 3.5–5.2)
Sodium: 141 mmol/L (ref 134–144)

## 2017-10-24 LAB — HEPATITIS B SURFACE ANTIGEN: HEP B S AG: NEGATIVE

## 2017-10-24 LAB — CYCLIC CITRUL PEPTIDE ANTIBODY, IGG/IGA: Cyclic Citrullin Peptide Ab: >250 units — ABNORMAL HIGH (ref 0–19)

## 2017-10-24 LAB — HEPATITIS B DNA, ULTRAQUANTITATIVE, PCR: HBV DNA SERPL PCR-ACNC: NOT DETECTED IU/mL

## 2017-10-24 LAB — RHEUMATOID FACTOR

## 2017-10-24 LAB — TSH: TSH: 4.44 u[IU]/mL (ref 0.450–4.500)

## 2017-10-24 LAB — SEDIMENTATION RATE

## 2017-10-24 LAB — ANA W/REFLEX IF POSITIVE: Anti Nuclear Antibody(ANA): NEGATIVE

## 2017-10-24 NOTE — Telephone Encounter (Signed)
The patient has been notified of this information and all questions answered.

## 2017-10-24 NOTE — Telephone Encounter (Signed)
-----   Message from Leta Baptist, PA-C sent at 10/23/2017  5:26 PM EDT ----- Please let her know that her Hep B studies are negative, no DNA detected.  She does NOT have Hep B.  Thank you,  Jess

## 2017-10-25 MED FILL — ?DICLOFENAC SOD DR 75 MG TA: 75 | 30 days supply | Qty: 60 | Fill #0 | Status: TO

## 2017-10-26 ENCOUNTER — Encounter: Payer: Self-pay | Admitting: Internal Medicine

## 2017-10-27 ENCOUNTER — Encounter: Payer: Self-pay | Admitting: Pulmonary Disease

## 2017-10-27 ENCOUNTER — Telehealth: Payer: Self-pay | Admitting: Internal Medicine

## 2017-10-27 ENCOUNTER — Ambulatory Visit (INDEPENDENT_AMBULATORY_CARE_PROVIDER_SITE_OTHER): Payer: Medicaid Other | Admitting: Pulmonary Disease

## 2017-10-27 VITALS — BP 152/88 | HR 100 | Ht 61.0 in | Wt >= 6400 oz

## 2017-10-27 DIAGNOSIS — R0602 Shortness of breath: Secondary | ICD-10-CM

## 2017-10-27 DIAGNOSIS — L97912 Non-pressure chronic ulcer of unspecified part of right lower leg with fat layer exposed: Secondary | ICD-10-CM

## 2017-10-27 DIAGNOSIS — G4733 Obstructive sleep apnea (adult) (pediatric): Secondary | ICD-10-CM

## 2017-10-27 DIAGNOSIS — J4541 Moderate persistent asthma with (acute) exacerbation: Secondary | ICD-10-CM | POA: Diagnosis not present

## 2017-10-27 LAB — PULMONARY FUNCTION TEST
DL/VA % PRED: 156 %
DL/VA: 6.87 ml/min/mmHg/L
DLCO cor % pred: 113 %
DLCO cor: 22.91 ml/min/mmHg
DLCO unc % pred: 105 %
DLCO unc: 21.28 ml/min/mmHg
FEF 25-75 Post: 3.84 L/sec
FEF 25-75 Pre: 3.58 L/sec
FEF2575-%CHANGE-POST: 7 %
FEF2575-%PRED-POST: 120 %
FEF2575-%Pred-Pre: 111 %
FEV1-%CHANGE-POST: 0 %
FEV1-%Pred-Post: 76 %
FEV1-%Pred-Pre: 76 %
FEV1-PRE: 2.18 L
FEV1-Post: 2.2 L
FEV1FVC-%Change-Post: 0 %
FEV1FVC-%PRED-PRE: 108 %
FEV6-%Change-Post: 1 %
FEV6-%Pred-Post: 72 %
FEV6-%Pred-Pre: 71 %
FEV6-POST: 2.43 L
FEV6-PRE: 2.4 L
FEV6FVC-%PRED-PRE: 101 %
FEV6FVC-%Pred-Post: 101 %
FVC-%CHANGE-POST: 1 %
FVC-%PRED-POST: 71 %
FVC-%PRED-PRE: 70 %
FVC-POST: 2.43 L
FVC-PRE: 2.4 L
POST FEV1/FVC RATIO: 90 %
PRE FEV6/FVC RATIO: 100 %
Post FEV6/FVC ratio: 100 %
Pre FEV1/FVC ratio: 91 %
RV % PRED: 135 %
RV: 1.75 L
TLC % pred: 93 %
TLC: 4.29 L

## 2017-10-27 NOTE — Progress Notes (Signed)
Patient completed full PFT today. 

## 2017-10-27 NOTE — Progress Notes (Addendum)
       Courtney Zamora    [id number]    08/25/1985  Primary Care Physician:Johnson, Deborah B, MD  Referring Physician: Johnson, Deborah B, MD [address] , [place] [address]  Chief complaint: Follow-up for dyspnea  HPI: 32-year-old with history of anxiety, asthma, depression, obesity, degenerative joint disease.  She has a diagnosis of exercise-induced asthma childhood asthma.  She has difficulties when seasons change.  Sensitive to strong perfumes, cinnamon, cleaning with liquids.  Has complains of dyspnea with activity for the past few months.  She denies dyspnea at rest or wheezing.  No cough, sputum production.  No significant problems with snoring.  Denies daytime sleepiness. She has been on Symbicort for the past 2 years but cannot tell if it is helping.  She has albuterol inhaler which she uses occasionally.  Pets: Has a number of animals at home including snakes, lizards, hedgehogs, ferrets, dogs Occupation: Currently unemployed Exposures: No relevant exposure, no mold at home Smoking history: non smoker Travel History: Not relevant  Interim History: Hospitalized in February for dyspnea with CT showing mild groundglass opacity.  Treated with antibiotics, steroids Since then she has been seen twice at urgent care and emergency room for cough, dyspnea treated with antibiotics and prednisone.  Seen by GI for esophageal abnormality showing possible polyps and severe fatty liver.  She has been scheduled for EGD Noted to have elevated CCP and rheumatoid factor.  Refered to rheumatology Sleep study shows sleep apnea with desaturation.  She is awaiting initiation of CPAP.  Outpatient Encounter Medications as of 10/27/2017  Medication Sig  . albuterol (PROVENTIL HFA;VENTOLIN HFA) 108 (90 Base) MCG/ACT inhaler Inhale 2 puffs into the lungs every 6 (six) hours as needed for wheezing or shortness of breath (wheezing). For shortness of breath.  . albuterol (PROVENTIL) (2.5  MG/3ML) 0.083% nebulizer solution Take 3 mLs (2.5 mg total) by nebulization every 6 (six) hours as needed for wheezing or shortness of breath.  . blood glucose meter kit and supplies KIT Dispense based on patient and insurance preference. Use up to four times daily as directed. (FOR ICD-9 250.00, 250.01).  . budesonide-formoterol (SYMBICORT) 160-4.5 MCG/ACT inhaler Inhale 2 puffs into the lungs 2 (two) times daily.  . Calcium Citrate 200 MG TABS Take 2 tablets (400 mg total) by mouth every other day. (Patient taking differently: Take 400 mg by mouth at bedtime. )  . cetirizine (ZYRTEC) 10 MG tablet Take 1 tablet (10 mg total) by mouth daily.  . Cholecalciferol (VITAMIN D3) 5000 units CAPS Take 5,000 Units by mouth daily.   . diclofenac (VOLTAREN) 75 MG EC tablet TAKE 1 TABLET BY MOUTH 2 TIMES DAILY AS NEEDED.  . diclofenac (VOLTAREN) 75 MG EC tablet Take 1 tablet (75 mg total) by mouth 2 (two) times daily as needed for mild pain.  . diclofenac sodium (VOLTAREN) 1 % GEL Apply 2 g topically 4 (four) times daily. (Patient taking differently: Apply 2 g topically 4 (four) times daily as needed (pain). )  . DULoxetine (CYMBALTA) 30 MG capsule Take 1 capsule (30 mg total) by mouth 2 (two) times daily.  . furosemide (LASIX) 20 MG tablet 1 tab PO every other day  . gabapentin (NEURONTIN) 300 MG capsule TAKE ONE CAPSULE BY MOUTH IN THE MORNING, ONE CAPSULE AT NOON AND TWO CAPSULES AT BEDTIME.  . glucose blood (TRUE METRIX BLOOD GLUCOSE TEST) test strip Check sugar 3-4 times a day before meals  . guaiFENesin-codeine (ROBITUSSIN AC) 100-10   MG/5ML syrup Take 5 mLs by mouth 3 (three) times daily as needed for cough.  . hydrochlorothiazide (HYDRODIURIL) 25 MG tablet Take 1 tablet (25 mg total) by mouth daily.  . hydrOXYzine (ATARAX/VISTARIL) 10 MG tablet Take 1 tablet (10 mg total) by mouth 3 (three) times daily as needed for anxiety.  . insulin aspart (NOVOLOG) 100 UNIT/ML injection Inject 15 Units into the skin 3  (three) times daily before meals.  . insulin glargine (LANTUS) 100 UNIT/ML injection Inject 0.37 mLs (37 Units total) into the skin at bedtime.  . Insulin Pen Needle (PEN NEEDLES) 31G X 8 MM MISC Use as directed  . ipratropium-albuterol (DUONEB) 0.5-2.5 (3) MG/3ML SOLN Take 3 mLs by nebulization every 6 (six) hours as needed. (Patient taking differently: Take 3 mLs by nebulization every 6 (six) hours as needed. Wheezing)  . liraglutide (VICTOZA) 18 MG/3ML SOPN 0.6 mg Subcut daily for 1 wk then increase to 1.2 mg  . LORazepam (ATIVAN) 1 MG tablet Take 1 tablet (1 mg total) by mouth as needed for anxiety (30 minutes before procedure).  . metFORMIN (GLUCOPHAGE-XR) 500 MG 24 hr tablet 2 tabs PO Q a.m and 1 tab Q p.m  . montelukast (SINGULAIR) 10 MG tablet Take 1 tablet (10 mg total) by mouth daily.  . Multiple Vitamins-Minerals (WOMENS MULTI) CAPS Take 1 capsule by mouth daily.  . mupirocin ointment (BACTROBAN) 2 % Apply to the affected area BID  . pantoprazole (PROTONIX) 40 MG tablet Take 1 tablet (40 mg total) by mouth 2 (two) times daily before a meal.  . predniSONE (DELTASONE) 20 MG tablet 3 tabs daily for 1 week then 2 tabs daily for 1 week and then 1 tab daily.  . promethazine (PHENERGAN) 25 MG tablet Take 1 tablet (25 mg total) by mouth every 8 (eight) hours as needed for nausea or vomiting.  . Respiratory Therapy Supplies (FLUTTER) DEVI As directed.  . Spacer/Aero-Holding Chambers (AEROCHAMBER MV) inhaler Use as instructed  . tiZANidine (ZANAFLEX) 4 MG tablet TAKE 1 TABLET BY MOUTH EVERY 6 HOURS AS NEEDED FOR MUSCLE SPASMS.  . TRUEPLUS LANCETS 28G MISC Check sugar 3-4 times a day before meals  . [DISCONTINUED] amoxicillin-clavulanate (AUGMENTIN) 875-125 MG tablet Take 1 tablet by mouth 2 (two) times daily.   No facility-administered encounter medications on file as of 10/27/2017.     Allergies as of 10/27/2017 - Review Complete 10/27/2017  Allergen Reaction Noted  . Bee venom Anaphylaxis  07/29/2013  . Cinnamon Anaphylaxis 09/16/2012  . Doxycycline Hives 07/19/2017  . Ibuprofen Other (See Comments) 11/12/2013  . Lexapro [escitalopram oxalate]  03/17/2017  . Tape Hives and Rash 10/02/2016    Past Medical History:  Diagnosis Date  . Anxiety   . Asthma   . Depression   . Essential hypertension   . GERD (gastroesophageal reflux disease)   . Obesity   . Osteoarthritis, knee   . Seizures (HCC)   . Severe needle phobia     Past Surgical History:  Procedure Laterality Date  . TONSILLECTOMY      Family History  Problem Relation Age of Onset  . Hypothyroidism Mother   . Diabetes Father   . Diabetes Paternal Uncle   . Hypertension Maternal Grandmother   . Hypertension Maternal Grandfather   . Pancreatic cancer Maternal Grandfather   . Hypertension Paternal Grandmother   . Heart disease Paternal Grandfather   . Hypertension Paternal Grandfather     Social History   Socioeconomic History  . Marital status: Married      Spouse name: Not on file  . Number of children: Not on file  . Years of education: Not on file  . Highest education level: Not on file  Occupational History  . Not on file  Social Needs  . Financial resource strain: Not on file  . Food insecurity:    Worry: Not on file    Inability: Not on file  . Transportation needs:    Medical: Not on file    Non-medical: Not on file  Tobacco Use  . Smoking status: Never Smoker  . Smokeless tobacco: Never Used  Substance and Sexual Activity  . Alcohol use: No  . Drug use: No  . Sexual activity: Yes    Birth control/protection: None  Lifestyle  . Physical activity:    Days per week: Not on file    Minutes per session: Not on file  . Stress: Not on file  Relationships  . Social connections:    Talks on phone: Not on file    Gets together: Not on file    Attends religious service: Not on file    Active member of club or organization: Not on file    Attends meetings of clubs or organizations:  Not on file    Relationship status: Not on file  . Intimate partner violence:    Fear of current or ex partner: Not on file    Emotionally abused: Not on file    Physically abused: Not on file    Forced sexual activity: Not on file  Other Topics Concern  . Not on file  Social History Narrative  . Not on file   Review of systems: Review of Systems  Constitutional: Negative for fever and chills.  HENT: Negative.   Eyes: Negative for blurred vision.  Respiratory: as per HPI  Cardiovascular: Negative for chest pain and palpitations.  Gastrointestinal: Negative for vomiting, diarrhea, blood per rectum. Genitourinary: Negative for dysuria, urgency, frequency and hematuria.  Musculoskeletal: Negative for myalgias, back pain and joint pain.  Skin: Negative for itching and rash.  Neurological: Negative for dizziness, tremors, focal weakness, seizures and loss of consciousness.  Endo/Heme/Allergies: Negative for environmental allergies.  Psychiatric/Behavioral: Negative for depression, suicidal ideas and hallucinations.  All other systems reviewed and are negative.  Physical Exam: Blood pressure 130/62, pulse (!) 113, height 5' 4" (1.626 m), weight (!) 405 lb (183.7 kg), SpO2 92 %. Gen:      No acute distress HEENT:  EOMI, sclera anicteric Neck:     No masses; no thyromegaly Lungs:    Clear to auscultation bilaterally; normal respiratory effort CV:         Regular rate and rhythm; no murmurs Abd:      + bowel sounds; soft, non-tender; no palpable masses, no distension Ext:    No edema; adequate peripheral perfusion Skin:      Warm and dry; no rash Neuro: alert and oriented x 3 Psych: normal mood and affect  Data Reviewed: Lung Spirometry 05/01/17 FVC 2.53 [67%], FEV1 2.28 [72%], F/F 90 No obstruction, restriction possible.  PFTs 10/19/17 FVC 2.43 [71%], FEV1 2.20 [76%], F/F 90, TLC 93% DLCO 105%, DLCO/VA 156% Normal spirometry.  No restriction.  Increased diffusion  capacity.  FENO 05/01/17-7 FENO 06/19/17- 5 FENO 07/28/17- 7 FENO 08/14/17-5  Chest x-ray 12/09/16-no acute abnormality.  Chest x-ray 07/19/17-no acute abnormality. Chest x-ray 07/28/17-possible right basilar infiltrate Chest x-ray 08/03/17-atelectasis/infiltrate in the right infrahilar region Chest x-ray 08/08/17- infiltrate in the right infrahilar region has cleared.    No other lung abnormalities.  CT chest 08/10/17-very mild nonspecific groundglass opacity.  Dilation of pulmonic trunk with possible pulmonary arterial hypertension, hepatic steatosis, nodular densities in the distal third of the esophagus concerning for small polyps. CTA 09/05/17-groundglass opacity with air trapping, severe hepatic steatosis.  I have reviewed the images personally  Sleep study 10/02/17 Sleep apnea, CPAP titration-recommended auto CPAP 10-21 cm  Echocardiogram 08/17/17 LVEF 60-65%, no diastolic dysfunction.  PA systolic pressure within normal limits.  Assessment:  Eval for asthma She has a diagnosis of childhood asthma and exercise-induced asthma.  However the symptoms are not really typical and FENO is low, PFTs does not show any obstruction.  Suspect that her weight and lack of exercise may be contributing to her symptoms.  She is limited the amount of exercise she can do due to joint disease. Continue current inhalers and DuoNeb's as needed for rescue therapy. She can stop the Symbicort.  Rheumatoid arthritis Noted to have elevated CCP and rheumatoid factor consistent with rheumatoid arthritis.  In retrospect her groundglass opacities on CT scan, responsive to steroids may be related to mild pneumonitis from connective tissue disease Referred to rheumatology for further evaluation.  Sleep apnea Complete patient assistance form to initiate CPAP.  Plan/Recommendations: - Continue albuterol, duo nebs - CPAP for sleep apnea      MD [facility] Pager [phone]  [phone] 10/27/2017, 3:12 PM  CC: Johnson, Deborah B, MD   

## 2017-10-27 NOTE — Patient Instructions (Signed)
Your lung function test look okay.  There is no evidence of asthma. I think we can stop the Symbicort.  Just use the albuterol and duo nebs as needed We will work on getting a CPAP started for your sleep apnea Follow-up in 6 months.

## 2017-10-27 NOTE — Telephone Encounter (Signed)
-----   Message from Dionne Bucy sent at 10/27/2017 10:11 AM EDT ----- I don't see the wound care referral in placed  Thank you  ----- Message ----- From: Marcine Matar, MD Sent: 10/26/2017  10:30 PM To: Dionne Bucy  Thanks.  Also, can we see if we can get her to Wound clinic ASAP for right leg ulcer.  Order placed. ----- Message ----- From: Dionne Bucy Sent: 10/26/2017  11:13 AM To: Marcine Matar, MD  The referral was placed on 10-24-17 to Dr Corliss Skains waiting for her respond  To see if she is going to accept the referral or not . ----- Message ----- From: Marcine Matar, MD Sent: 10/23/2017   3:31 PM To: Dionne Bucy  Can we try to get her in with rheumatology ASAP?

## 2017-10-31 ENCOUNTER — Telehealth (INDEPENDENT_AMBULATORY_CARE_PROVIDER_SITE_OTHER): Payer: Self-pay | Admitting: Specialist

## 2017-10-31 NOTE — Telephone Encounter (Signed)
Patient called wanting to know if Nitka received labs results from PCP

## 2017-10-31 NOTE — Telephone Encounter (Signed)
Patient called wanting to know if Nitka received labs results from PCP.  Please call patient to advise.

## 2017-11-01 ENCOUNTER — Ambulatory Visit: Payer: Medicaid Other | Attending: Internal Medicine | Admitting: Physician Assistant

## 2017-11-01 ENCOUNTER — Other Ambulatory Visit: Payer: Self-pay | Admitting: Internal Medicine

## 2017-11-01 VITALS — BP 133/79 | HR 112 | Temp 98.0°F | Resp 16 | Wt >= 6400 oz

## 2017-11-01 DIAGNOSIS — E669 Obesity, unspecified: Secondary | ICD-10-CM | POA: Insufficient documentation

## 2017-11-01 DIAGNOSIS — F329 Major depressive disorder, single episode, unspecified: Secondary | ICD-10-CM | POA: Insufficient documentation

## 2017-11-01 DIAGNOSIS — Z794 Long term (current) use of insulin: Secondary | ICD-10-CM | POA: Insufficient documentation

## 2017-11-01 DIAGNOSIS — I10 Essential (primary) hypertension: Secondary | ICD-10-CM | POA: Insufficient documentation

## 2017-11-01 DIAGNOSIS — E1165 Type 2 diabetes mellitus with hyperglycemia: Secondary | ICD-10-CM | POA: Insufficient documentation

## 2017-11-01 DIAGNOSIS — K219 Gastro-esophageal reflux disease without esophagitis: Secondary | ICD-10-CM | POA: Insufficient documentation

## 2017-11-01 DIAGNOSIS — L03115 Cellulitis of right lower limb: Secondary | ICD-10-CM

## 2017-11-01 DIAGNOSIS — Z881 Allergy status to other antibiotic agents status: Secondary | ICD-10-CM | POA: Insufficient documentation

## 2017-11-01 DIAGNOSIS — Z79899 Other long term (current) drug therapy: Secondary | ICD-10-CM | POA: Insufficient documentation

## 2017-11-01 DIAGNOSIS — F419 Anxiety disorder, unspecified: Secondary | ICD-10-CM | POA: Insufficient documentation

## 2017-11-01 DIAGNOSIS — M79661 Pain in right lower leg: Secondary | ICD-10-CM | POA: Insufficient documentation

## 2017-11-01 DIAGNOSIS — J45909 Unspecified asthma, uncomplicated: Secondary | ICD-10-CM | POA: Insufficient documentation

## 2017-11-01 LAB — GLUCOSE, POCT (MANUAL RESULT ENTRY): POC Glucose: 178 mg/dl — AB (ref 70–99)

## 2017-11-01 MED ORDER — ACETAMINOPHEN-CODEINE #3 300-30 MG PO TABS: 1.0000 | ORAL_TABLET | ORAL | 0 refills | Status: DC | PRN

## 2017-11-01 MED ORDER — SULFAMETHOXAZOLE-TRIMETHOPRIM 800-160 MG PO TABS: 1.0000 | ORAL_TABLET | Freq: Two times a day (BID) | ORAL | 0 refills | Status: DC

## 2017-11-01 MED FILL — ACETAMINOPHEN/COD #3 TABLET: 300-30 | 2 days supply | Qty: 15 | Fill #0

## 2017-11-01 MED FILL — SULFAMETHOXAZOLE-TMP DS TAB: 800-160 | 10 days supply | Qty: 20 | Fill #0

## 2017-11-01 MED FILL — HYDROCHLOROTHIAZIDE 25 MG T: 25 | 30 days supply | Qty: 30 | Fill #1

## 2017-11-01 MED FILL — METFORMIN HCL ER 500 MG TAB: 500 | 30 days supply | Qty: 90 | Fill #1

## 2017-11-01 NOTE — Patient Instructions (Signed)

## 2017-11-01 NOTE — Progress Notes (Signed)
Patient ID: Courtney Zamora, female   DOB: 1986/03/12, 32 y.o.   MRN: [id number]   Courtney Zamora, is a 32 y.o. female  BHA:[id number]  XBD:[id number]  DOB - 01-14-1986  Subjective:  Chief Complaint and HPI: Courtney Zamora is a 33 y.o. female here today with continued R lower leg pain where she has a small ulceration.  The area is burning and stinging.  Using bactroban with little relief.  No f/c.  Wound care appt has been made for end of April.  Needs something for pain.  No f/c.  Also using alcohol and peroxide on it to clean it.    ROS:   Constitutional:  No f/c, No night sweats, No unexplained weight loss. EENT:  No vision changes, No blurry vision, No hearing changes. No mouth, throat, or ear problems.  Respiratory: No cough, No SOB Cardiac: No CP, no palpitations GI:  No abd pain, No N/V/D. GU: No Urinary s/sx Musculoskeletal: + R leg pain Neuro: No headache, no dizziness, no motor weakness.  Skin: No rash Endocrine:  No polydipsia. No polyuria.  Psych: Denies SI/HI  No problems updated.  ALLERGIES: Allergies  Allergen Reactions  . Bee Venom Anaphylaxis  . Cinnamon Anaphylaxis  . Doxycycline Hives  . Ibuprofen Other (See Comments)    Abdominal pain  . Lexapro [Escitalopram Oxalate]     Generalized body shaking. ? sz  . Tape Hives and Rash    EKG STICKERS give pt rash, hives    PAST MEDICAL HISTORY: Past Medical History:  Diagnosis Date  . Anxiety   . Asthma   . Depression   . Essential hypertension   . GERD (gastroesophageal reflux disease)   . Obesity   . Osteoarthritis, knee   . Seizures ([place])   . Severe needle phobia     MEDICATIONS AT HOME: Prior to Admission medications   Medication Sig Start Date End Date Taking? Authorizing Provider  albuterol (PROVENTIL HFA;VENTOLIN HFA) 108 (90 Base) MCG/ACT inhaler Inhale 2 puffs into the lungs every 6 (six) hours as needed for wheezing or shortness of breath (wheezing). For shortness of breath. 10/10/17  Yes  Gildardo Pounds, NP  albuterol (PROVENTIL) (2.5 MG/3ML) 0.083% nebulizer solution Take 3 mLs (2.5 mg total) by nebulization every 6 (six) hours as needed for wheezing or shortness of breath. 11/17/16  Yes Funches, Adriana Mccallum, MD  blood glucose meter kit and supplies KIT Dispense based on patient and insurance preference. Use up to four times daily as directed. (FOR ICD-9 250.00, 250.01). 09/08/17  Yes Florencia Reasons, MD  Calcium Citrate 200 MG TABS Take 2 tablets (400 mg total) by mouth every other day. Patient taking differently: Take 400 mg by mouth at bedtime.  10/05/16  Yes Rowe Clack, MD  cetirizine (ZYRTEC) 10 MG tablet Take 1 tablet (10 mg total) by mouth daily. 11/22/16  Yes Elsie Stain, MD  Cholecalciferol (VITAMIN D3) 5000 units CAPS Take 5,000 Units by mouth daily.    Yes [provider]  diclofenac (VOLTAREN) 75 MG EC tablet Take 1 tablet (75 mg total) by mouth 2 (two) times daily as needed for mild pain. 10/20/17  Yes Ladell Pier, MD  diclofenac sodium (VOLTAREN) 1 % GEL Apply 2 g topically 4 (four) times daily. Patient taking differently: Apply 2 g topically 4 (four) times daily as needed (pain).  05/04/17  Yes Jessy Oto, MD  DULoxetine (CYMBALTA) 30 MG capsule Take 1 capsule (30 mg total) by mouth 2 (two) times  daily. 08/15/17  Yes Ladell Pier, MD  furosemide (LASIX) 20 MG tablet 1 tab PO every other day 10/20/17  Yes Ladell Pier, MD  gabapentin (NEURONTIN) 300 MG capsule TAKE ONE CAPSULE BY MOUTH IN THE MORNING, ONE CAPSULE AT NOON AND TWO CAPSULES AT BEDTIME. 10/19/17  Yes Ladell Pier, MD  glucose blood (TRUE METRIX BLOOD GLUCOSE TEST) test strip Check sugar 3-4 times a day before meals 09/28/17  Yes Ladell Pier, MD  hydrochlorothiazide (HYDRODIURIL) 25 MG tablet Take 1 tablet (25 mg total) by mouth daily. 10/02/17  Yes Ladell Pier, MD  hydrOXYzine (ATARAX/VISTARIL) 10 MG tablet Take 1 tablet (10 mg total) by mouth 3 (three) times daily  as needed for anxiety. 09/08/17  Yes Florencia Reasons, MD  insulin aspart (NOVOLOG) 100 UNIT/ML injection Inject 15 Units into the skin 3 (three) times daily before meals. 10/20/17  Yes Ladell Pier, MD  insulin glargine (LANTUS) 100 UNIT/ML injection Inject 0.37 mLs (37 Units total) into the skin at bedtime. 10/20/17  Yes Ladell Pier, MD  Insulin Pen Needle (PEN NEEDLES) 31G X 8 MM MISC Use as directed 10/20/17  Yes Ladell Pier, MD  liraglutide (VICTOZA) 18 MG/3ML SOPN 0.6 mg Subcut daily for 1 wk then increase to 1.2 mg 10/20/17  Yes Ladell Pier, MD  LORazepam (ATIVAN) 1 MG tablet Take 1 tablet (1 mg total) by mouth as needed for anxiety (30 minutes before procedure). 09/19/17  Yes Zehr, Laban Emperor, PA-C  metFORMIN (GLUCOPHAGE-XR) 500 MG 24 hr tablet 2 tabs PO Q a.m and 1 tab Q p.m 10/02/17  Yes Ladell Pier, MD  montelukast (SINGULAIR) 10 MG tablet Take 1 tablet (10 mg total) by mouth daily. 08/09/17 08/09/18 Yes Mannam, Hart Robinsons, MD  Multiple Vitamins-Minerals (WOMENS MULTI) CAPS Take 1 capsule by mouth daily.   Yes [provider]  mupirocin ointment (BACTROBAN) 2 % Apply to the affected area BID 10/16/17  Yes Ladell Pier, MD  pantoprazole (PROTONIX) 40 MG tablet Take 1 tablet (40 mg total) by mouth 2 (two) times daily before a meal. 09/19/17  Yes Zehr, Laban Emperor, PA-C  promethazine (PHENERGAN) 25 MG tablet Take 1 tablet (25 mg total) by mouth every 8 (eight) hours as needed for nausea or vomiting. 11/17/16  Yes Boykin Nearing, MD  Respiratory Therapy Supplies (FLUTTER) DEVI As directed. 07/28/17  Yes Marshell Garfinkel, MD  Spacer/Aero-Holding Chambers (AEROCHAMBER MV) inhaler Use as instructed 01/25/17  Yes Elsie Stain, MD  tiZANidine (ZANAFLEX) 4 MG tablet TAKE 1 TABLET BY MOUTH EVERY 6 HOURS AS NEEDED FOR MUSCLE SPASMS. 09/13/17  Yes Ladell Pier, MD  TRUEPLUS LANCETS 28G MISC Check sugar 3-4 times a day before meals 09/28/17  Yes Ladell Pier, MD    budesonide-formoterol [facility]) 160-4.5 MCG/ACT inhaler Inhale 2 puffs into the lungs 2 (two) times daily. Patient not taking: Reported on 11/01/2017 03/18/16   Boykin Nearing, MD  diclofenac (VOLTAREN) 75 MG EC tablet TAKE 1 TABLET BY MOUTH 2 TIMES DAILY AS NEEDED. 10/25/17   Jessy Oto, MD  guaiFENesin-codeine (ROBITUSSIN AC) 100-10 MG/5ML syrup Take 5 mLs by mouth 3 (three) times daily as needed for cough. Patient not taking: Reported on 11/01/2017 10/11/17   Delia Heady, PA-C  ipratropium-albuterol (DUONEB) 0.5-2.5 (3) MG/3ML SOLN Take 3 mLs by nebulization every 6 (six) hours as needed. Patient taking differently: Take 3 mLs by nebulization every 6 (six) hours as needed. Wheezing 07/28/17   Mannam, Praveen,  MD  sulfamethoxazole-trimethoprim (BACTRIM DS,SEPTRA DS) 800-160 MG tablet Take 1 tablet by mouth 2 (two) times daily. 11/01/17   Argentina Donovan, PA-C     Objective:  EXAM:   Vitals:   11/01/17 1425  BP: 133/79  Pulse: (!) 112  Resp: 16  Temp: 98 F (36.7 C)  TempSrc: Oral  SpO2: 96%  Weight: (!) 406 lb 12.8 oz (184.5 kg)    General appearance : A&OX3. NAD. Non-toxic-appearing HEENT: Atraumatic and Normocephalic.  PERRLA. EOM intact.   Neck: supple, no JVD. No cervical lymphadenopathy. No thyromegaly Chest/Lungs:  Breathing-non-labored, Good air entry bilaterally, breath sounds normal without rales, rhonchi, or wheezing  CVS: S1 S2 regular, no murmurs, gallops, rubs  Extremities: Bilateral Lower Ext shows 1+ edema B, both legs are warm to touch with = pulse throughout.  Small (<1cm superficial ulceration with no fluctuance or induration).   Neurology:  CN II-XII grossly intact, Non focal.   Psych:  TP linear. J/I WNL. Normal speech. Appropriate eye contact and affect.  Skin:  No Rash  Data Review Lab Results  Component Value Date   HGBA1C 9.6 (H) 09/05/2017     Assessment & Plan   1. Cellulitis of right lower extremity #15 tylenol #3 prescribed.  Wound  care-stop alcohol/peroxide and use warm soap and water daily.   Keep wound care clinic appt - sulfamethoxazole-trimethoprim (BACTRIM DS,SEPTRA DS) 800-160 MG tablet; Take 1 tablet by mouth 2 (two) times daily.  Dispense: 20 tablet; Refill: 0  2. Type 2 diabetes mellitus with hyperglycemia, without long-term current use of insulin (HCC) Uncontrolled.  Needs to aggressively work on lower carb diet and increasing mobility/weight loss.  No changes to current regimen.   - Glucose (CBG)     Patient have been counseled extensively about nutrition and exercise  Return for keep 11/17/2017 appt with Dr Wynetta Emery .  The patient was given clear instructions to go to ER or return to medical center if symptoms don't improve, worsen or new problems develop. The patient verbalized understanding. The patient was told to call to get lab results if they haven't heard anything in the next week.     Freeman Caldron, PA-C [facility] and [place], [place]   11/01/2017, 2:40 PM

## 2017-11-01 NOTE — Progress Notes (Signed)
Pt. Is here for her ulcer spots on her right leg.  Pt. Stated its burning and giving her pain, leaking fluid.

## 2017-11-02 ENCOUNTER — Ambulatory Visit: Payer: Self-pay | Admitting: Internal Medicine

## 2017-11-02 MED ORDER — ACETAMINOPHEN-CODEINE #3 300-30 MG PO TABS: 1.0000 | ORAL_TABLET | Freq: Four times a day (QID) | ORAL | 0 refills | Status: DC | PRN

## 2017-11-02 MED FILL — **VICTOZA 18 MG/3 ML INJECT: 18 | 30 days supply | Qty: 6 | Fill #1

## 2017-11-02 NOTE — Telephone Encounter (Signed)
TTyl#3 #120 rxn to fill 11/05/2017.  Second rxn to fill 12/04/2017.

## 2017-11-02 NOTE — Telephone Encounter (Signed)
Pt is aware.  

## 2017-11-02 NOTE — Telephone Encounter (Signed)
Pt requestting RF on Tyl#3. [facility] reviewed and is appropriate.  Dr. Alvis Lemmings sent rxn for #15 tabs yesterday in my absence.  RF given.

## 2017-11-05 ENCOUNTER — Encounter: Payer: Self-pay | Admitting: Internal Medicine

## 2017-11-05 NOTE — Progress Notes (Signed)
Letter written for pt to assist with getting Medicaid.

## 2017-11-06 MED FILL — ACETAMINOPHEN/COD #3 TABLET: 300-30 | 30 days supply | Qty: 120 | Fill #0

## 2017-11-10 MED FILL — FUROSEMIDE 20 MG TABLET: 20 | 30 days supply | Qty: 15 | Fill #1

## 2017-11-10 MED FILL — TRUEplus LANCETS 28G MISC: 25 days supply | Qty: 100 | Fill #2 | Status: TO

## 2017-11-10 MED FILL — TRUE METRIX TEST STRIP: 25 days supply | Qty: 100 | Fill #2 | Status: TO

## 2017-11-10 MED FILL — MONTELUKAST SOD 10 MG TAB: 10 | 30 days supply | Qty: 30 | Fill #3

## 2017-11-10 MED FILL — PROMETHAZINE 25 MG TABLET: 25 | 30 days supply | Qty: 90 | Fill #9

## 2017-11-13 ENCOUNTER — Ambulatory Visit: Payer: Self-pay | Admitting: Pulmonary Disease

## 2017-11-14 ENCOUNTER — Encounter (HOSPITAL_BASED_OUTPATIENT_CLINIC_OR_DEPARTMENT_OTHER): Payer: Medicaid Other | Attending: Internal Medicine

## 2017-11-14 DIAGNOSIS — E11622 Type 2 diabetes mellitus with other skin ulcer: Secondary | ICD-10-CM | POA: Insufficient documentation

## 2017-11-14 DIAGNOSIS — Z6841 Body Mass Index (BMI) 40.0 and over, adult: Secondary | ICD-10-CM | POA: Insufficient documentation

## 2017-11-14 DIAGNOSIS — L97811 Non-pressure chronic ulcer of other part of right lower leg limited to breakdown of skin: Secondary | ICD-10-CM | POA: Diagnosis not present

## 2017-11-14 DIAGNOSIS — Z794 Long term (current) use of insulin: Secondary | ICD-10-CM | POA: Insufficient documentation

## 2017-11-14 DIAGNOSIS — I10 Essential (primary) hypertension: Secondary | ICD-10-CM | POA: Diagnosis not present

## 2017-11-14 DIAGNOSIS — E669 Obesity, unspecified: Secondary | ICD-10-CM | POA: Diagnosis not present

## 2017-11-14 DIAGNOSIS — M069 Rheumatoid arthritis, unspecified: Secondary | ICD-10-CM | POA: Insufficient documentation

## 2017-11-14 DIAGNOSIS — G4730 Sleep apnea, unspecified: Secondary | ICD-10-CM | POA: Insufficient documentation

## 2017-11-16 ENCOUNTER — Telehealth: Payer: Self-pay

## 2017-11-16 NOTE — Telephone Encounter (Signed)
JA 

## 2017-11-17 ENCOUNTER — Ambulatory Visit: Payer: Medicaid Other | Attending: Internal Medicine | Admitting: Internal Medicine

## 2017-11-17 ENCOUNTER — Telehealth: Payer: Self-pay | Admitting: Gastroenterology

## 2017-11-17 ENCOUNTER — Encounter (HOSPITAL_BASED_OUTPATIENT_CLINIC_OR_DEPARTMENT_OTHER): Payer: Medicaid Other | Attending: Internal Medicine

## 2017-11-17 ENCOUNTER — Encounter: Payer: Self-pay | Admitting: Internal Medicine

## 2017-11-17 VITALS — BP 137/80 | HR 107 | Temp 98.3°F | Resp 18 | Ht 64.0 in | Wt >= 6400 oz

## 2017-11-17 DIAGNOSIS — E119 Type 2 diabetes mellitus without complications: Secondary | ICD-10-CM | POA: Insufficient documentation

## 2017-11-17 DIAGNOSIS — E669 Obesity, unspecified: Secondary | ICD-10-CM | POA: Insufficient documentation

## 2017-11-17 DIAGNOSIS — Z888 Allergy status to other drugs, medicaments and biological substances status: Secondary | ICD-10-CM | POA: Insufficient documentation

## 2017-11-17 DIAGNOSIS — L97812 Non-pressure chronic ulcer of other part of right lower leg with fat layer exposed: Secondary | ICD-10-CM | POA: Diagnosis present

## 2017-11-17 DIAGNOSIS — Z6841 Body Mass Index (BMI) 40.0 and over, adult: Secondary | ICD-10-CM | POA: Insufficient documentation

## 2017-11-17 DIAGNOSIS — Z886 Allergy status to analgesic agent status: Secondary | ICD-10-CM | POA: Insufficient documentation

## 2017-11-17 DIAGNOSIS — Z794 Long term (current) use of insulin: Secondary | ICD-10-CM | POA: Diagnosis not present

## 2017-11-17 DIAGNOSIS — Z79899 Other long term (current) drug therapy: Secondary | ICD-10-CM | POA: Insufficient documentation

## 2017-11-17 DIAGNOSIS — K760 Fatty (change of) liver, not elsewhere classified: Secondary | ICD-10-CM | POA: Insufficient documentation

## 2017-11-17 DIAGNOSIS — M0579 Rheumatoid arthritis with rheumatoid factor of multiple sites without organ or systems involvement: Secondary | ICD-10-CM | POA: Diagnosis not present

## 2017-11-17 DIAGNOSIS — I890 Lymphedema, not elsewhere classified: Secondary | ICD-10-CM | POA: Insufficient documentation

## 2017-11-17 DIAGNOSIS — L97912 Non-pressure chronic ulcer of unspecified part of right lower leg with fat layer exposed: Secondary | ICD-10-CM | POA: Diagnosis not present

## 2017-11-17 DIAGNOSIS — Z881 Allergy status to other antibiotic agents status: Secondary | ICD-10-CM | POA: Insufficient documentation

## 2017-11-17 DIAGNOSIS — F329 Major depressive disorder, single episode, unspecified: Secondary | ICD-10-CM | POA: Insufficient documentation

## 2017-11-17 DIAGNOSIS — K7460 Unspecified cirrhosis of liver: Secondary | ICD-10-CM | POA: Diagnosis not present

## 2017-11-17 DIAGNOSIS — R1310 Dysphagia, unspecified: Secondary | ICD-10-CM | POA: Insufficient documentation

## 2017-11-17 DIAGNOSIS — J45909 Unspecified asthma, uncomplicated: Secondary | ICD-10-CM | POA: Diagnosis not present

## 2017-11-17 DIAGNOSIS — J4540 Moderate persistent asthma, uncomplicated: Secondary | ICD-10-CM | POA: Insufficient documentation

## 2017-11-17 DIAGNOSIS — F419 Anxiety disorder, unspecified: Secondary | ICD-10-CM | POA: Insufficient documentation

## 2017-11-17 DIAGNOSIS — M069 Rheumatoid arthritis, unspecified: Secondary | ICD-10-CM | POA: Diagnosis not present

## 2017-11-17 DIAGNOSIS — I10 Essential (primary) hypertension: Secondary | ICD-10-CM | POA: Insufficient documentation

## 2017-11-17 DIAGNOSIS — G4730 Sleep apnea, unspecified: Secondary | ICD-10-CM | POA: Diagnosis not present

## 2017-11-17 DIAGNOSIS — G8929 Other chronic pain: Secondary | ICD-10-CM | POA: Insufficient documentation

## 2017-11-17 DIAGNOSIS — Z7952 Long term (current) use of systemic steroids: Secondary | ICD-10-CM | POA: Insufficient documentation

## 2017-11-17 LAB — POCT GLYCOSYLATED HEMOGLOBIN (HGB A1C): Hemoglobin A1C: 8.3

## 2017-11-17 LAB — GLUCOSE, POCT (MANUAL RESULT ENTRY): POC GLUCOSE: 126 mg/dL — AB (ref 70–99)

## 2017-11-17 MED ORDER — HYDROXYCHLOROQUINE SULFATE 200 MG PO TABS: ORAL_TABLET | ORAL | 1 refills | Status: DC

## 2017-11-17 MED FILL — HYDROXYCHLOROQUINE SULFATE: 200 | 30 days supply | Qty: 60 | Fill #0 | Status: TO

## 2017-11-17 NOTE — Telephone Encounter (Signed)
Just spoke to patient, unfortunately she cannot make the EGD for 5/6, due to no ride. I will reschedule her to next available date which is 01/22/18. I told patient that I would contact her on Monday.

## 2017-11-17 NOTE — Telephone Encounter (Signed)
Thank you. She would need same pre-procedure instructions regarding NPO after MN and diabetic treatment.

## 2017-11-17 NOTE — Telephone Encounter (Signed)
Courtney Zamora,  As we just discussed, I have discovered that this patient is on the schedule for an EGD in the [facility] endo lab Thurs, May 9th at 830, but I am in the [facility] that day. I do not know how this mistake was made, but the procedure must be moved.   As it happens, I have an open slot next Monday, May 6th at [facility]. Make arrangements to move her to the 6th if feasible.  If not, it must be rescheduled to my next available hospital outpatient slot.   Jess, please try to figure out who you were working with that day so we can figure out how this happened.  - HD

## 2017-11-17 NOTE — Progress Notes (Signed)
Patient ID: JENNAFER GLADUE, female    DOB: October 20, 1985  MRN: [id number]  CC: Follow-up (medication )   Subjective: Aubreyanna Dorrough is a 32 y.o. female who presents for f/u.  Spouse is with her. Her concerns today include:   1. DM: checking BS QID and has a log with her.  Blood sugars have significantly improved.  A.m. range is 110-120 a.m. most of the other blood sugars have remained below 200.  She is tolerating the Victoza well.  Eating habits:  Small portions  2.  Right leg ulcer: She has been seen at wound Clinic x 2 - seen today.  Leg is currently wrapped  3.  Approved for disability.  She is waiting to hear whether she would be approved for Medicaid to cover her until she gets Medicare.  4.  Labs on last visit revealed positive rheumatoid factor and anti-CCP.  She has had chronic joint pains since I started seeing her last year.  Joints that are most bothersome are knees, ankles and wrists, RT eldow and shoulder.  We referred her to rheumatology but it was denied.I think the rheumatologist does not take the orange card/cone discount.  She was given information to apply for charity at [facility] .   Patient Active Problem List   Diagnosis Date Noted  . Abnormal CT scan, esophagus 09/19/2017  . Dysphagia 09/19/2017  . Elevated LFTs 09/19/2017  . Newly diagnosed diabetes ([place]) 09/15/2017  . Hepatic steatosis 09/15/2017  . DM2 (diabetes mellitus, type 2) ([place]) 09/06/2017  . SOB (shortness of breath) 09/06/2017  . CAP (community acquired pneumonia) 08/03/2017  . Acute frontal sinusitis 12/15/2016  . Metatarsal stress fracture, right, sequela 11/17/2016  . Neck muscle spasm 10/05/2016  . Nipple discharge in female 09/13/2016  . Depression 08/17/2016  . Anxiety 08/17/2016  . ASCUS with positive high risk HPV cervical 08/14/2016  . Hypertension 08/14/2016  . Long term (current) use of systemic steroids 08/14/2016  . Esophageal reflux 12/29/2015  . Morbid (severe)  obesity due to excess calories ([place]) 12/29/2015  . Chronic pain of right knee 12/29/2015  . Chronic pain of left ankle 12/29/2015  . 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 Asthma, moderate persistent 12/09/2015     Current Outpatient Medications on File Prior to Visit  Medication Sig Dispense Refill  . [START ON 12/04/2017] acetaminophen-codeine (TYLENOL #3) 300-30 MG tablet Take 1 tablet by mouth every 6 (six) hours as needed for moderate pain. 120 tablet 0  . albuterol (PROVENTIL HFA;VENTOLIN HFA) 108 (90 Base) MCG/ACT inhaler Inhale 2 puffs into the lungs every 6 (six) hours as needed for wheezing or shortness of breath (wheezing). For shortness of breath. (Patient taking differently: Inhale 2 puffs into the lungs every 6 (six) hours as needed for wheezing or shortness of breath. ) 1 Inhaler 0  . albuterol (PROVENTIL) (2.5 MG/3ML) 0.083% nebulizer solution Take 3 mLs (2.5 mg total) by nebulization every 6 (six) hours as needed for wheezing or shortness of breath. 150 mL 1  . Biotin w/ Vitamins C & E (HAIR/SKIN/NAILS PO) Take 1 tablet by mouth daily.    . blood glucose meter kit and supplies KIT Dispense based on patient and insurance preference. Use up to four times daily as directed. (FOR ICD-9 250.00, 250.01). (Patient not taking: Reported on 11/16/2017) 1 each 0  . budesonide-formoterol (SYMBICORT) 160-4.5 MCG/ACT inhaler Inhale 2 puffs into the lungs 2 (two) times daily. (Patient not taking: Reported on 11/01/2017) 3 Inhaler 3  .  Calcium Citrate 200 MG TABS Take 2 tablets (400 mg total) by mouth every other day. (Patient taking differently: Take 200 mg by mouth daily. )    . cetirizine (ZYRTEC) 10 MG tablet Take 1 tablet (10 mg total) by mouth daily. (Patient taking differently: Take 10 mg by mouth at bedtime. ) 30 tablet 11  . diclofenac (VOLTAREN) 75 MG EC tablet Take 1 tablet (75 mg total) by mouth 2 (two) times daily as needed for mild pain. 60 tablet 2  . diclofenac sodium (VOLTAREN) 1 % GEL  Apply 2 g topically 4 (four) times daily. (Patient taking differently: Apply 2 g topically 4 (four) times daily as needed (pain). ) 3 Tube 3  . DULoxetine (CYMBALTA) 30 MG capsule Take 1 capsule (30 mg total) by mouth 2 (two) times daily. 60 capsule 6  . furosemide (LASIX) 20 MG tablet 1 tab PO every other day (Patient taking differently: Take 20 mg by mouth every other day. ) 30 tablet 2  . gabapentin (NEURONTIN) 300 MG capsule TAKE ONE CAPSULE BY MOUTH IN THE MORNING, ONE CAPSULE AT NOON AND TWO CAPSULES AT BEDTIME. (Patient taking differently: Take 300-600 mg by mouth See admin instructions. Take 300 mg by mouth in the morning, take 300 mg by mouth at noon and take 600 mg by mouth at bedtime) 120 capsule 4  . glucose blood (TRUE METRIX BLOOD GLUCOSE TEST) test strip Check sugar 3-4 times a day before meals (Patient not taking: Reported on 11/16/2017) 100 each 12  . guaiFENesin-codeine (ROBITUSSIN AC) 100-10 MG/5ML syrup Take 5 mLs by mouth 3 (three) times daily as needed for cough. (Patient not taking: Reported on 11/01/2017) 50 mL 0  . hydrochlorothiazide (HYDRODIURIL) 25 MG tablet Take 1 tablet (25 mg total) by mouth daily. 90 tablet 3  . hydrOXYzine (ATARAX/VISTARIL) 10 MG tablet Take 1 tablet (10 mg total) by mouth 3 (three) times daily as needed for anxiety. 30 tablet 0  . insulin aspart (NOVOLOG) 100 UNIT/ML injection Inject 15 Units into the skin 3 (three) times daily before meals. 10 mL 11  . insulin glargine (LANTUS) 100 UNIT/ML injection Inject 0.37 mLs (37 Units total) into the skin at bedtime. (Patient taking differently: Inject 52 Units into the skin at bedtime. If BS > 130 increase by 2 units for 3 days) 10 mL 4  . Insulin Pen Needle (PEN NEEDLES) 31G X 8 MM MISC Use as directed (Patient not taking: Reported on 11/16/2017) 100 each 3  . ipratropium-albuterol (DUONEB) 0.5-2.5 (3) MG/3ML SOLN Take 3 mLs by nebulization every 6 (six) hours as needed. (Patient taking differently: Take 3 mLs by  nebulization every 4 (four) hours as needed (for shortness of breath or wheezing). ) 360 mL 5  . liraglutide (VICTOZA) 18 MG/3ML SOPN 0.6 mg Subcut daily for 1 wk then increase to 1.2 mg (Patient taking differently: Inject 1.2 mg into the skin daily. ) 3 mL 2  . LORazepam (ATIVAN) 1 MG tablet Take 1 tablet (1 mg total) by mouth as needed for anxiety (30 minutes before procedure). 2 tablet 0  . metFORMIN (GLUCOPHAGE-XR) 500 MG 24 hr tablet 2 tabs PO Q a.m and 1 tab Q p.m (Patient taking differently: Take 500-1,000 mg by mouth See admin instructions. Take 1000 mg by mouth in the morning and take 500 mg by mouth at bedtime) 90 tablet 6  . montelukast (SINGULAIR) 10 MG tablet Take 1 tablet (10 mg total) by mouth daily. 30 tablet 5  . Multiple  Vitamins-Minerals (WOMENS MULTI) CAPS Take 1 capsule by mouth daily.    . mupirocin ointment (BACTROBAN) 2 % Apply to the affected area BID (Patient not taking: Reported on 11/16/2017) 22 g 0  . pantoprazole (PROTONIX) 40 MG tablet Take 1 tablet (40 mg total) by mouth 2 (two) times daily before a meal. 60 tablet 3  . promethazine (PHENERGAN) 25 MG tablet Take 1 tablet (25 mg total) by mouth every 8 (eight) hours as needed for nausea or vomiting. 2060 tablet 2  . Respiratory Therapy Supplies (FLUTTER) DEVI As directed. (Patient not taking: Reported on 11/16/2017) 1 each 0  . Spacer/Aero-Holding Chambers (AEROCHAMBER MV) inhaler Use as instructed (Patient not taking: Reported on 11/16/2017) 1 each 0  . tiZANidine (ZANAFLEX) 4 MG tablet TAKE 1 TABLET BY MOUTH EVERY 6 HOURS AS NEEDED FOR MUSCLE SPASMS. 60 tablet 2  . TRUEPLUS LANCETS 28G MISC Check sugar 3-4 times a day before meals (Patient not taking: Reported on 11/16/2017) 100 each 12   No current facility-administered medications on file prior to visit.     Allergies  Allergen Reactions  . Bee Venom Anaphylaxis  . Cinnamon Anaphylaxis  . Doxycycline Hives  . Ibuprofen Other (See Comments)    Abdominal pain  .  Lexapro [Escitalopram Oxalate] Other (See Comments)    Generalized body shaking. ? sz  . Tape Hives, Rash and Other (See Comments)    EKG STICKERS    Social History   Socioeconomic History  . Marital status: Married    Spouse name: Not on file  . Number of children: Not on file  . Years of education: Not on file  . Highest education level: Not on file  Occupational History  . Not on file  Social Needs  . Financial resource strain: Not on file  . Food insecurity:    Worry: Not on file    Inability: Not on file  . Transportation needs:    Medical: Not on file    Non-medical: Not on file  Tobacco Use  . Smoking status: Never Smoker  . Smokeless tobacco: Never Used  Substance and Sexual Activity  . Alcohol use: No  . Drug use: No  . Sexual activity: Yes    Birth control/protection: None  Lifestyle  . Physical activity:    Days per week: Not on file    Minutes per session: Not on file  . Stress: Not on file  Relationships  . Social connections:    Talks on phone: Not on file    Gets together: Not on file    Attends religious service: Not on file    Active member of club or organization: Not on file    Attends meetings of clubs or organizations: Not on file    Relationship status: Not on file  . Intimate partner violence:    Fear of current or ex partner: Not on file    Emotionally abused: Not on file    Physically abused: Not on file    Forced sexual activity: Not on file  Other Topics Concern  . Not on file  Social History Narrative  . Not on file    Family History  Problem Relation Age of Onset  . Hypothyroidism Mother   . Diabetes Father   . Diabetes Paternal Uncle   . Hypertension Maternal Grandmother   . Hypertension Maternal Grandfather   . Pancreatic cancer Maternal Grandfather   . Hypertension Paternal Grandmother   . Heart disease Paternal Grandfather   .  Hypertension Paternal Grandfather     Past Surgical History:  Procedure Laterality Date    . TONSILLECTOMY      ROS: Review of Systems Negative except as stated above PHYSICAL EXAM: BP 137/80 (BP Location: Left Arm, Patient Position: Sitting, Cuff Size: Normal)   Pulse (!) 107   Temp 98.3 F (36.8 C) (Oral)   Resp 18   Ht '5\' 4"'$  (1.626 m)   Wt (!) 413 lb (187.3 kg)   SpO2 93%   BMI 70.89 kg/m   Wt Readings from Last 3 Encounters:  11/17/17 (!) 413 lb (187.3 kg)  11/01/17 (!) 406 lb 12.8 oz (184.5 kg)  10/27/17 (!) 409 lb (185.5 kg)     Physical Exam General appearance - alert, well appearing, and in no distress Mental status - alert, oriented to person, place, and time, normal mood, behavior, speech, dress, motor activity, and thought processes Musculoskeletal -enlargement and mild edema in the right third and fourth PIPs and left second and third MCPs and PIPs.  Enlargement of the wrist joints with discomfort on passive range of motion  Results for orders placed or performed in visit on 11/17/17  Glucose (CBG)  Result Value Ref Range   POC Glucose 126 (A) 70 - 99 mg/dl  HgB A1c  Result Value Ref Range   Hemoglobin A1C 8.3      Chemistry      Component Value Date/Time   NA 141 10/20/2017 1123   K 4.1 10/20/2017 1123   CL 98 10/20/2017 1123   CO2 23 10/20/2017 1123   BUN 23 (H) 10/20/2017 1123   CREATININE 0.68 10/20/2017 1123      Component Value Date/Time   CALCIUM 9.7 10/20/2017 1123   ALKPHOS 62 09/10/2017 0042   AST 50 (H) 09/10/2017 0042   ALT 78 (H) 09/10/2017 0042   BILITOT 0.6 09/10/2017 0042     Lab Results  Component Value Date   WBC 11.4 (H) 10/11/2017   HGB 11.3 (L) 10/11/2017   HCT 38.4 10/11/2017   MCV 86.1 10/11/2017   PLT 327 10/11/2017   Lab Results  Component Value Date   ANA Negative 10/20/2017   RF >650.0 (H) 10/20/2017   CCP >250 ASSESSMENT AND PLAN: 1. Controlled type 2 diabetes mellitus without complication, with long-term current use of insulin (HCC) Blood sugars much better.  Her lunchtime blood sugars are  little higher than I would like.  We will have her increase the lunchtime NovoLog from 15 units to 17 units.  Otherwise she would continue the current dose of Lantus with Novolog being 15/17/15 and Victoza - Glucose (CBG) - HgB A1c  2. Leg ulcer, right, with fat layer exposed ([place]) Followed by wound clinic  3. Rheumatoid arthritis involving multiple sites with positive rheumatoid factor ([place]) -Discussed with her diagnosis of rheumatoid arthritis including cause and symptoms.  We also discussed management and the importance of getting the disease under control.  Ideally she needs to be under the care of her rheumatologist but until she gets Medicaid or is approved for charity with [facility] I think we should go ahead and start her on medication.  Methotrexate is the most recommended oral medicine to start with.  However patient is young and methotrexate is to try to genic should she become pregnant on the medication.  She also has mild elevation in LFTs most likely due to fatty liver so I am skeptical about starting methotrexate.  Other option would be Plaquenil which is usually well-tolerated  and should she become pregnant on the medication, it would not have to be discontinued.  Patient advised that the medicine can sometimes affect the retina and yearly eye exams are recommended.  Advised her to get an eye exam as soon as possible.  Patient is agreeable to trying the Plaquenil -I think we should start her on DMARD - hydroxychloroquine (PLAQUENIL) 200 MG tablet; 1 tab daily PO x 1 wk then 1 tab PO BID  Dispense: 60 tablet; Refill: 1   Patient was given the opportunity to ask questions.  Patient verbalized understanding of the plan and was able to repeat key elements of the plan.   Orders Placed This Encounter  Procedures  . Glucose (CBG)  . HgB A1c     Requested Prescriptions   Signed Prescriptions Disp Refills  . hydroxychloroquine (PLAQUENIL) 200 MG tablet 60 tablet 1    Sig: 1 tab daily  PO x 1 wk then 1 tab PO BID    Return in about 6 weeks (around 12/29/2017).  Karle Plumber, MD, FACP

## 2017-11-17 NOTE — Patient Instructions (Addendum)
Change Novolog to :  15/17/15. Start Plaquenil.  Please get eye exam done and let them know that you are on Plaquenil for rheumatoid arthritis.

## 2017-11-17 NOTE — Telephone Encounter (Signed)
Dr. Myrtie Neither, I have called and lvm for patient to call us back and that we will need to reschedule her procedure. I did contact [facility] scheduling and have moved procedure to Monday 5/6 at 9:15. I am still waiting on confirmation from patient. I have also sent her a MyChart message with detailed information about date/time and when to arrive, asking that she please contact our office to confirm. If we do not hear from her will need to reschedule to next available hospital day.  Thanks, Raynelle Fanning

## 2017-11-17 NOTE — Telephone Encounter (Signed)
Sandi Mariscal.  Just FYI.  I know that you are new, and I do not know who was helping you that day.

## 2017-11-20 ENCOUNTER — Other Ambulatory Visit: Payer: Self-pay | Admitting: Internal Medicine

## 2017-11-20 DIAGNOSIS — G8929 Other chronic pain: Secondary | ICD-10-CM

## 2017-11-20 DIAGNOSIS — M25561 Pain in right knee: Principal | ICD-10-CM

## 2017-11-20 DIAGNOSIS — M25572 Pain in left ankle and joints of left foot: Secondary | ICD-10-CM

## 2017-11-20 MED FILL — ?DICLOFENAC SOD DR 75 MG TA: 75 | 30 days supply | Qty: 60 | Fill #1 | Status: TO

## 2017-11-20 MED FILL — GABAPENTIN 300 MG CAPSULE: 300 | 30 days supply | Qty: 120 | Fill #1 | Status: TO

## 2017-11-20 MED FILL — !NOVOLOG 100UNITS/ML VIAL: 100/ML | 22 days supply | Qty: 10 | Fill #1

## 2017-11-20 MED FILL — ?DULOXETINE HCL 30 MG CPEP: 30 | 30 days supply | Qty: 60 | Fill #2

## 2017-11-20 MED FILL — ?PANTOPRAZOLE SO DR 40MG TA: 40 | 30 days supply | Qty: 60 | Fill #2

## 2017-11-21 DIAGNOSIS — L97812 Non-pressure chronic ulcer of other part of right lower leg with fat layer exposed: Secondary | ICD-10-CM | POA: Diagnosis not present

## 2017-11-21 MED FILL — ?TIZANIDINE HCL 4MG TABLETS: 4 | 15 days supply | Qty: 60 | Fill #0

## 2017-11-24 DIAGNOSIS — L97812 Non-pressure chronic ulcer of other part of right lower leg with fat layer exposed: Secondary | ICD-10-CM | POA: Diagnosis not present

## 2017-11-25 ENCOUNTER — Emergency Department (HOSPITAL_COMMUNITY)
Admission: EM | Admit: 2017-11-25 | Discharge: 2017-11-25 | Disposition: A | Discharge status: Home / Self Care | Payer: Medicaid Other | Attending: Emergency Medicine | Admitting: Emergency Medicine

## 2017-11-25 ENCOUNTER — Emergency Department (HOSPITAL_COMMUNITY): Payer: Medicaid Other

## 2017-11-25 ENCOUNTER — Other Ambulatory Visit: Payer: Self-pay

## 2017-11-25 ENCOUNTER — Encounter (HOSPITAL_COMMUNITY): Payer: Self-pay

## 2017-11-25 DIAGNOSIS — I10 Essential (primary) hypertension: Secondary | ICD-10-CM | POA: Insufficient documentation

## 2017-11-25 DIAGNOSIS — J45909 Unspecified asthma, uncomplicated: Secondary | ICD-10-CM | POA: Diagnosis not present

## 2017-11-25 DIAGNOSIS — I4581 Long QT syndrome: Secondary | ICD-10-CM | POA: Insufficient documentation

## 2017-11-25 DIAGNOSIS — Z79899 Other long term (current) drug therapy: Secondary | ICD-10-CM | POA: Insufficient documentation

## 2017-11-25 DIAGNOSIS — T378X5A Adverse effect of other specified systemic anti-infectives and antiparasitics, initial encounter: Secondary | ICD-10-CM | POA: Diagnosis not present

## 2017-11-25 DIAGNOSIS — R079 Chest pain, unspecified: Secondary | ICD-10-CM | POA: Diagnosis present

## 2017-11-25 DIAGNOSIS — E119 Type 2 diabetes mellitus without complications: Secondary | ICD-10-CM | POA: Diagnosis not present

## 2017-11-25 DIAGNOSIS — Z794 Long term (current) use of insulin: Secondary | ICD-10-CM | POA: Diagnosis not present

## 2017-11-25 DIAGNOSIS — R531 Weakness: Secondary | ICD-10-CM | POA: Diagnosis not present

## 2017-11-25 DIAGNOSIS — M069 Rheumatoid arthritis, unspecified: Secondary | ICD-10-CM | POA: Diagnosis not present

## 2017-11-25 DIAGNOSIS — R9431 Abnormal electrocardiogram [ECG] [EKG]: Secondary | ICD-10-CM

## 2017-11-25 LAB — COMPREHENSIVE METABOLIC PANEL
ALT: 56 U/L — ABNORMAL HIGH (ref 14–54)
AST: 30 U/L (ref 15–41)
Albumin: 3 g/dL — ABNORMAL LOW (ref 3.5–5.0)
Alkaline Phosphatase: 58 U/L (ref 38–126)
Anion gap: 14 (ref 5–15)
BILIRUBIN TOTAL: 0.6 mg/dL (ref 0.3–1.2)
BUN: 10 mg/dL (ref 6–20)
CALCIUM: 9.3 mg/dL (ref 8.9–10.3)
CO2: 29 mmol/L (ref 22–32)
Chloride: 97 mmol/L — ABNORMAL LOW (ref 101–111)
Creatinine, Ser: 0.72 mg/dL (ref 0.44–1.00)
GFR calc Af Amer: >60 mL/min (ref >60)
Glucose, Bld: 125 mg/dL — ABNORMAL HIGH (ref 65–99)
POTASSIUM: 3.2 mmol/L — AB (ref 3.5–5.1)
Sodium: 140 mmol/L (ref 135–145)
TOTAL PROTEIN: 7.4 g/dL (ref 6.5–8.1)

## 2017-11-25 LAB — PROTIME-INR
INR: 1.13
Prothrombin Time: 14.4 seconds (ref 11.4–15.2)

## 2017-11-25 LAB — CBC
HEMATOCRIT: 35.5 % — AB (ref 36.0–46.0)
Hemoglobin: 10.4 g/dL — ABNORMAL LOW (ref 12.0–15.0)
MCH: 25.2 pg — ABNORMAL LOW (ref 26.0–34.0)
MCHC: 29.3 g/dL — ABNORMAL LOW (ref 30.0–36.0)
MCV: 86 fL (ref 78.0–100.0)
Platelets: 397 10*3/uL (ref 150–400)
RBC: 4.13 MIL/uL (ref 3.87–5.11)
RDW: 16.2 % — ABNORMAL HIGH (ref 11.5–15.5)
WBC: 10 10*3/uL (ref 4.0–10.5)

## 2017-11-25 LAB — I-STAT TROPONIN, ED: Troponin i, poc: 0 ng/mL (ref 0.00–0.08)

## 2017-11-25 LAB — URINALYSIS, ROUTINE W REFLEX MICROSCOPIC
Bilirubin Urine: NEGATIVE
GLUCOSE, UA: NEGATIVE mg/dL
Hgb urine dipstick: NEGATIVE
KETONES UR: 20 mg/dL — AB
Nitrite: NEGATIVE
PH: 5 (ref 5.0–8.0)
Protein, ur: 30 mg/dL — AB
SPECIFIC GRAVITY, URINE: 1.027 (ref 1.005–1.030)

## 2017-11-25 LAB — I-STAT BETA HCG BLOOD, ED (MC, WL, AP ONLY)

## 2017-11-25 LAB — D-DIMER, QUANTITATIVE (NOT AT ARMC)

## 2017-11-25 LAB — I-STAT CG4 LACTIC ACID, ED
LACTIC ACID, VENOUS: 1.59 mmol/L (ref 0.5–1.9)
Lactic Acid, Venous: 1.55 mmol/L (ref 0.5–1.9)

## 2017-11-25 LAB — CBG MONITORING, ED: Glucose-Capillary: 117 mg/dL — ABNORMAL HIGH (ref 65–99)

## 2017-11-25 MED ORDER — ACETAMINOPHEN-CODEINE #3 300-30 MG PO TABS
1.0000 | ORAL_TABLET | Freq: Once | ORAL | Status: AC
  Administered 2017-11-25: 1 via ORAL
  Filled 2017-11-25: qty 1

## 2017-11-25 MED ORDER — IOPAMIDOL (ISOVUE-370) INJECTION 76%
INTRAVENOUS | Status: AC
  Filled 2017-11-25: qty 100

## 2017-11-25 MED ORDER — ONDANSETRON HCL 4 MG/2ML IJ SOLN
4.0000 mg | Freq: Once | INTRAMUSCULAR | Status: AC
  Administered 2017-11-25: 4 mg via INTRAVENOUS
  Filled 2017-11-25: qty 2

## 2017-11-25 MED ORDER — LORAZEPAM 2 MG/ML IJ SOLN: 1.0000 mg | Freq: Once | INTRAMUSCULAR | Status: DC

## 2017-11-25 MED ORDER — MECLIZINE HCL 25 MG PO TABS: 25.0000 mg | ORAL_TABLET | Freq: Three times a day (TID) | ORAL | 0 refills | Status: DC | PRN

## 2017-11-25 MED ORDER — IOPAMIDOL (ISOVUE-370) INJECTION 76%
100.0000 mL | Freq: Once | INTRAVENOUS | Status: AC | PRN
  Administered 2017-11-25: 100 mL via INTRAVENOUS

## 2017-11-25 MED ORDER — LORAZEPAM 2 MG/ML IJ SOLN
1.0000 mg | Freq: Once | INTRAMUSCULAR | Status: AC
  Administered 2017-11-25: 1 mg via INTRAVENOUS
  Filled 2017-11-25: qty 1

## 2017-11-25 MED ORDER — PREDNISONE 10 MG (21) PO TBPK: ORAL_TABLET | Freq: Every day | ORAL | 0 refills | Status: DC

## 2017-11-25 MED ORDER — MECLIZINE HCL 25 MG PO TABS
25.0000 mg | ORAL_TABLET | Freq: Once | ORAL | Status: AC
  Administered 2017-11-25: 25 mg via ORAL
  Filled 2017-11-25: qty 1

## 2017-11-25 MED ORDER — SODIUM CHLORIDE 0.9 % IV BOLUS
1000.0000 mL | Freq: Once | INTRAVENOUS | Status: AC
  Administered 2017-11-25: 1000 mL via INTRAVENOUS

## 2017-11-25 NOTE — ED Notes (Addendum)
Patient unable to void at this time. Patient refusing to use pure wick or bed pan at this time.

## 2017-11-25 NOTE — ED Notes (Signed)
Pt aware urine sample is needed 

## 2017-11-25 NOTE — Discharge Instructions (Signed)
Thank you for allowing me to provide your care today in the emergency department.  Please call and schedule follow-up appointment with your primary care provider at [facility].  Please have them reevaluate your QTC by repeating an EKG at your visit.  Stop taking Plaquenil until you are reevaluated by primary care.  Take 6 tabs of prednisone by mouth daily  for 2 days, then 5 tabs for 2 days, then 4 tabs for 2 days, then 3 tabs for 2 days, 2 tabs for 2 days, then 1 tab by mouth daily for 2 days.  Take 1 tablet of meclizine every 8 hours as needed for dizziness.  Please continue to follow up with wound care regarding the wound on your right lower leg.  If you develop significantly worsening symptoms including high fever, respiratory distress, or other concerning symptoms, please return to the emergency department for reevaluation.

## 2017-11-25 NOTE — ED Notes (Signed)
Bed: WA21 Expected date:  Expected time:  Means of arrival:  Comments: Hold for triage 1 

## 2017-11-25 NOTE — ED Notes (Signed)
Patient refused blood draw @ this time. 

## 2017-11-25 NOTE — ED Notes (Signed)
Pt states that she is hurting too much to ambulate.

## 2017-11-25 NOTE — ED Triage Notes (Signed)
Patient presents with chest pain, weakness, chills, dizziness, nausea/vomiting and joint pain. Patient has history of Rheumatoid Arthritis and states her medications for her RA have changed approx 7 days ago, and patient is unsure if she is reacting to the medication. Patient Tachycardic and hypertensive in triage. Patient reports she hasnt been able to keep down any medication since yesterday. Patient reports 9/10 "sharp pain" to her left chest. Patient denies SOB.

## 2017-11-25 NOTE — ED Provider Notes (Addendum)
[facility]-EMERGENCY DEPT Provider Note   CSN: [id number] Arrival date & time: 11/25/17  1300     History   Chief Complaint Chief Complaint  Patient presents with  . Chest Pain  . Weakness    HPI Courtney Zamora is a 32 y.o. female with history of morbid obesity, severe needle phobia, asthma, HTN, depression, anxiety, osteoarthritis, diabetes mellitus type 2, and rheumatoid arthritis who presents to the emergency department with multiple complaints.  The patient endorses constant chest pain, characterized as sharp, that radiates from the right to the left side of her chest.  She reports associated dyspnea and states that she has been feeling hot and cold all day.  She reports a history of similar pain when she was diagnosed with pneumonia.  She denies wheezing.  She reports she is only been using her albuterol inhaler at home maybe once or twice per week.  She also endorses one episode of syncope that occurred prior to arrival the last for less than 10 seconds.  She states that she was sitting in a chair when she suddenly felt hot and clammy all over and passed out.  She also reports an associated headache, dizziness, and blurred vision over the last few days. She had several episodes of NBNB emesis and diarrhea that began yesterday.  She states that she has been unable to keep any of her medications down since yesterday. She denies dysuria, constipation, abdominal pain, or back pain.  She also endorses generalized weakness and joint pain that is worse in the bilateral ankles, knees, wrists, and shoulders.  She states that today she has developed new pain in her neck.  She denies stiffness.  Joint pain began several weeks ago her knees and wrist, but has been gradually worsening.  She was recently diagnosed with rheumatoid arthritis and was started on Plaquenil about 1 week ago.  She reports that she has been checking her blood sugars at home have been good.  She is  followed by the wound clinic for a an ulcer on the right lower leg.  She also reports that there is an enlarging red area on her left lower leg.  She has required use of a wheelchair for ambulation at home.  The history is provided by the patient and the spouse. No language interpreter was used.    Past Medical History:  Diagnosis Date  . Anxiety   . Asthma   . Depression   . Essential hypertension   . GERD (gastroesophageal reflux disease)   . Obesity   . Osteoarthritis, knee   . Seizures (HCC)   . Severe needle phobia     Patient Active Problem List   Diagnosis Date Noted  . Abnormal CT scan, esophagus 09/19/2017  . Dysphagia 09/19/2017  . Elevated LFTs 09/19/2017  . Newly diagnosed diabetes (HCC) 09/15/2017  . Hepatic steatosis 09/15/2017  . DM2 (diabetes mellitus, type 2) (HCC) 09/06/2017  . SOB (shortness of breath) 09/06/2017  . CAP (community acquired pneumonia) 08/03/2017  . Acute frontal sinusitis 12/15/2016  . Metatarsal stress fracture, right, sequela 11/17/2016  . Neck muscle spasm 10/05/2016  . Nipple discharge in female 09/13/2016  . Depression 08/17/2016  . Anxiety 08/17/2016  . ASCUS with positive high risk HPV cervical 08/14/2016  . Hypertension 08/14/2016  . Long term (current) use of systemic steroids 08/14/2016  . Esophageal reflux 12/29/2015  . Morbid (severe) obesity due to excess calories (HCC) 12/29/2015  . Chronic pain of right knee 12/29/2015  .  Chronic pain of left ankle 12/29/2015  . 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 Asthma, moderate persistent 12/09/2015    Past Surgical History:  Procedure Laterality Date  . TONSILLECTOMY       OB History    Gravida  1   Para      Term      Preterm      AB  1   Living  0     SAB  1   TAB      Ectopic      Multiple      Live Births               Home Medications    Prior to Admission medications   Medication Sig Start Date End Date Taking? Authorizing Provider    acetaminophen-codeine (TYLENOL #3) 300-30 MG tablet Take 1 tablet by mouth every 6 (six) hours as needed for moderate pain. 12/04/17  Yes Marcine Matar, MD  albuterol (PROVENTIL HFA;VENTOLIN HFA) 108 (90 Base) MCG/ACT inhaler Inhale 2 puffs into the lungs every 6 (six) hours as needed for wheezing or shortness of breath (wheezing). For shortness of breath. Patient taking differently: Inhale 2 puffs into the lungs every 6 (six) hours as needed for wheezing or shortness of breath.  10/10/17  Yes Claiborne Rigg, NP  Biotin w/ Vitamins C & E (HAIR/SKIN/NAILS PO) Take 1 tablet by mouth daily.   Yes [provider]  Calcium Citrate 200 MG TABS Take 2 tablets (400 mg total) by mouth every other day. Patient taking differently: Take 200 mg by mouth daily.  10/05/16  Yes Newt Lukes, MD  cetirizine (ZYRTEC) 10 MG tablet Take 1 tablet (10 mg total) by mouth daily. Patient taking differently: Take 10 mg by mouth at bedtime.  11/22/16  Yes Storm Frisk, MD  diclofenac (VOLTAREN) 75 MG EC tablet Take 1 tablet (75 mg total) by mouth 2 (two) times daily as needed for mild pain. Patient taking differently: Take 75 mg by mouth 2 (two) times daily.  10/20/17  Yes Marcine Matar, MD  DULoxetine (CYMBALTA) 30 MG capsule Take 1 capsule (30 mg total) by mouth 2 (two) times daily. 08/15/17  Yes Marcine Matar, MD  furosemide (LASIX) 20 MG tablet 1 tab PO every other day Patient taking differently: Take 20 mg by mouth every other day.  10/20/17  Yes Marcine Matar, MD  gabapentin (NEURONTIN) 300 MG capsule TAKE ONE CAPSULE BY MOUTH IN THE MORNING, ONE CAPSULE AT NOON AND TWO CAPSULES AT BEDTIME. Patient taking differently: Take 300-600 mg by mouth See admin instructions. Take 300 mg by mouth in the morning, take 300 mg by mouth at noon and take 600 mg by mouth at bedtime 10/19/17  Yes Marcine Matar, MD  hydrochlorothiazide (HYDRODIURIL) 25 MG tablet Take 1 tablet (25 mg total) by mouth  daily. 10/02/17  Yes Marcine Matar, MD  hydroxychloroquine (PLAQUENIL) 200 MG tablet 1 tab daily PO x 1 wk then 1 tab PO BID 11/17/17  Yes Marcine Matar, MD  insulin aspart (NOVOLOG) 100 UNIT/ML injection Inject 15 Units into the skin 3 (three) times daily before meals. Patient taking differently: Inject 15-17 Units into the skin 3 (three) times daily before meals. Inject 15 units in the am, Inject 17 units at lunch and Inject 15 units in the evening. 10/20/17  Yes Marcine Matar, MD  insulin glargine (LANTUS) 100 UNIT/ML injection Inject 0.37 mLs (37 Units total)  into the skin at bedtime. Patient taking differently: Inject 52 Units into the skin at bedtime. If BS > 130 increase by 2 units for 3 days 10/20/17  Yes Marcine Matar, MD  liraglutide (VICTOZA) 18 MG/3ML SOPN 0.6 mg Subcut daily for 1 wk then increase to 1.2 mg Patient taking differently: Inject 1.2 mg into the skin daily.  10/20/17  Yes Marcine Matar, MD  metFORMIN (GLUCOPHAGE-XR) 500 MG 24 hr tablet 2 tabs PO Q a.m and 1 tab Q p.m Patient taking differently: Take 500-1,000 mg by mouth See admin instructions. Take 1000 mg by mouth in the morning and take 500 mg by mouth at bedtime 10/02/17  Yes Marcine Matar, MD  montelukast (SINGULAIR) 10 MG tablet Take 1 tablet (10 mg total) by mouth daily. 08/09/17 08/09/18 Yes Mannam, Colbert Coyer, MD  Multiple Vitamins-Minerals (WOMENS MULTI) CAPS Take 1 capsule by mouth daily.   Yes [provider]  pantoprazole (PROTONIX) 40 MG tablet Take 1 tablet (40 mg total) by mouth 2 (two) times daily before a meal. 09/19/17  Yes Zehr, Shanda Bumps D, PA-C  tiZANidine (ZANAFLEX) 4 MG tablet TAKE 1 TABLET BY MOUTH EVERY 6 HOURS AS NEEDED FOR MUSCLE SPASMS. 11/20/17  Yes Marcine Matar, MD  albuterol (PROVENTIL) (2.5 MG/3ML) 0.083% nebulizer solution Take 3 mLs (2.5 mg total) by nebulization every 6 (six) hours as needed for wheezing or shortness of breath. 11/17/16   Funches, Gerilyn Nestle, MD    hydrOXYzine (ATARAX/VISTARIL) 10 MG tablet Take 1 tablet (10 mg total) by mouth 3 (three) times daily as needed for anxiety. 09/08/17   Albertine Grates, MD  Insulin Pen Needle (PEN NEEDLES) 31G X 8 MM MISC Use as directed Patient not taking: Reported on 11/16/2017 10/20/17   Marcine Matar, MD  ipratropium-albuterol (DUONEB) 0.5-2.5 (3) MG/3ML SOLN Take 3 mLs by nebulization every 6 (six) hours as needed. Patient taking differently: Take 3 mLs by nebulization every 4 (four) hours as needed (for shortness of breath or wheezing).  07/28/17   Mannam, Colbert Coyer, MD  meclizine (ANTIVERT) 25 MG tablet Take 1 tablet (25 mg total) by mouth 3 (three) times daily as needed for dizziness. 11/25/17   Hadden Steig A, PA-C  predniSONE (STERAPRED UNI-PAK 21 TAB) 10 MG (21) TBPK tablet Take by mouth daily. Take 6 tabs by mouth daily  for 2 days, then 5 tabs for 2 days, then 4 tabs for 2 days, then 3 tabs for 2 days, 2 tabs for 2 days, then 1 tab by mouth daily for 2 days 11/25/17   Kaysi Ourada A, PA-C  promethazine (PHENERGAN) 25 MG tablet Take 1 tablet (25 mg total) by mouth every 8 (eight) hours as needed for nausea or vomiting. 11/17/16   Dessa Phi, MD  Respiratory Therapy Supplies (FLUTTER) DEVI As directed. Patient not taking: Reported on 11/16/2017 07/28/17   Chilton Greathouse, MD  Spacer/Aero-Holding Chambers (AEROCHAMBER MV) inhaler Use as instructed Patient not taking: Reported on 11/16/2017 01/25/17   Storm Frisk, MD  TRUEPLUS LANCETS 28G MISC Check sugar 3-4 times a day before meals Patient not taking: Reported on 11/16/2017 09/28/17   Marcine Matar, MD    Family History Family History  Problem Relation Age of Onset  . Hypothyroidism Mother   . Diabetes Father   . Diabetes Paternal Uncle   . Hypertension Maternal Grandmother   . Hypertension Maternal Grandfather   . Pancreatic cancer Maternal Grandfather   . Hypertension Paternal Grandmother   . Heart disease  Paternal Grandfather   . Hypertension  Paternal Grandfather     Social History Social History   Tobacco Use  . Smoking status: Never Smoker  . Smokeless tobacco: Never Used  Substance Use Topics  . Alcohol use: No  . Drug use: No     Allergies   Bee venom; Cinnamon; Doxycycline; Ibuprofen; Lexapro [escitalopram oxalate]; and Tape   Review of Systems Review of Systems  Constitutional: Positive for chills and fever. Negative for activity change.  HENT: Negative for congestion, rhinorrhea and sore throat.   Eyes: Positive for visual disturbance (blurred).  Respiratory: Positive for shortness of breath. Negative for wheezing.   Cardiovascular: Positive for chest pain. Negative for leg swelling.  Gastrointestinal: Positive for diarrhea, nausea and vomiting. Negative for abdominal pain.  Genitourinary: Negative for dysuria and pelvic pain.  Musculoskeletal: Positive for arthralgias, gait problem, joint swelling and neck pain. Negative for back pain and neck stiffness.  Skin: Positive for rash.  Allergic/Immunologic: Negative for immunocompromised state.  Neurological: Positive for dizziness, syncope, weakness (generalized) and headaches. Negative for light-headedness.  Psychiatric/Behavioral: Negative for confusion.     Physical Exam Updated Vital Signs BP (!) 149/67   Pulse (!) 117   Temp 99.7 F (37.6 C) (Rectal)   Resp 18   Ht 5\' 4"  (1.626 m)   Wt (!) 186.4 kg (411 lb)   LMP 11/15/2017 (Exact Date)   SpO2 93%   BMI 70.55 kg/m   Physical Exam  Constitutional: No distress. Nasal cannula in place.  Morbidly obese.  HENT:  Head: Normocephalic.  Eyes: Pupils are equal, round, and reactive to light. Conjunctivae and EOM are normal.  Neck: Normal range of motion. Neck supple. No JVD present. No thyromegaly present.  Cardiovascular: Regular rhythm, normal heart sounds and intact distal pulses. Tachycardia present. Exam reveals no gallop and no friction rub.  No murmur heard. Pulses:      Radial pulses are  2+ on the right side, and 2+ on the left side.       Dorsalis pedis pulses are 2+ on the right side, and 2+ on the left side.       Posterior tibial pulses are 2+ on the right side, and 2+ on the left side.  Pulmonary/Chest: Effort normal and breath sounds normal. No accessory muscle usage or stridor. No tachypnea. No respiratory distress. She has no decreased breath sounds. She has no wheezes. She has no rhonchi. She has no rales. She exhibits no tenderness.  Abdominal: Soft. Bowel sounds are normal. She exhibits no distension and no mass. There is no tenderness. There is no rebound and no guarding. No hernia.  Musculoskeletal:       Right lower leg: She exhibits tenderness.       Left lower leg: She exhibits tenderness.  Diffusely tender to palpation to the bilateral ankles, knees, wrists, shoulders.  All joints are minimally erythematous and warm.  No edema.  Diffusely tender to palpation to the spinous processes and bilateral paraspinal muscles of the cervical spine.  Thoracic and lumbar spinous processes and paraspinal muscles are nontender.  5 out of 5 strength against resistance of bilateral upper and lower extremity's.  Sensation is intact throughout.  Radial, DP, PT pulses are 2+ and symmetric.  Lymphadenopathy:    She has no cervical adenopathy.  Neurological: She is alert.  Cranial nerves II through XII are grossly intact.  Normal tone.  GCS 15.  Alert and oriented x3.  Skin: Skin is warm. Capillary refill  takes less than 2 seconds. No ecchymosis and no rash noted. She is not diaphoretic. There is erythema. No cyanosis.  Large erythematous lesion noted to the left anterior leg.  It is not edematous or warm.  No nodule present.  No puncture wound noted.   There is an ulceration noted to the right lower leg.  No purulent drainage is noted.  Psychiatric: Her behavior is normal.  Nursing note and vitals reviewed.    ED Treatments / Results  Labs (all labs ordered are listed, but  only abnormal results are displayed) Labs Reviewed  CBC - Abnormal; Notable for the following components:      Result Value   Hemoglobin 10.4 (*)    HCT 35.5 (*)    MCH 25.2 (*)    MCHC 29.3 (*)    RDW 16.2 (*)    All other components within normal limits  COMPREHENSIVE METABOLIC PANEL - Abnormal; Notable for the following components:   Potassium 3.2 (*)    Chloride 97 (*)    Glucose, Bld 125 (*)    Albumin 3.0 (*)    ALT 56 (*)    All other components within normal limits  URINALYSIS, ROUTINE W REFLEX MICROSCOPIC - Abnormal; Notable for the following components:   Color, Urine AMBER (*)    APPearance HAZY (*)    Ketones, ur 20 (*)    Protein, ur 30 (*)    Leukocytes, UA SMALL (*)    Bacteria, UA RARE (*)    All other components within normal limits  D-DIMER, QUANTITATIVE (NOT AT [facility]) - Abnormal; Notable for the following components:   D-Dimer, Quant >20.00 (*)    All other components within normal limits  CBG MONITORING, ED - Abnormal; Notable for the following components:   Glucose-Capillary 117 (*)    All other components within normal limits  CULTURE, BLOOD (ROUTINE X 2)  CULTURE, BLOOD (ROUTINE X 2)  PROTIME-INR  I-STAT TROPONIN, ED  I-STAT BETA HCG BLOOD, ED ([facility], [facility], [facility] ONLY)  I-STAT CG4 LACTIC ACID, ED  I-STAT CG4 LACTIC ACID, ED    EKG EKG Interpretation  Date/Time:  Saturday Nov 25 2017 13:22:15 EDT Ventricular Rate:  115 PR Interval:    QRS Duration: 92 QT Interval:  359 QTC Calculation: 497 R Axis:   59 Text Interpretation:  Sinus tachycardia Prolonged QT interval No significant change since last tracing Confirmed by Doug Sou [phone]) on 11/26/2017 5:10:05 PM   Radiology Dg Chest 2 View  Result Date: 11/25/2017 CLINICAL DATA:  Chest pain and shortness of breath for 2 days. EXAM: CHEST - 2 VIEW COMPARISON:  10/11/2017 and prior radiographs FINDINGS: The cardiomediastinal silhouette is unremarkable. Mild chronic peribronchial thickening again  noted. There is no evidence of focal airspace disease, pulmonary edema, suspicious pulmonary nodule/mass, pleural effusion, or pneumothorax. No acute bony abnormalities are identified. IMPRESSION: No active cardiopulmonary disease. Electronically Signed   By: Harmon Pier M.D.   On: 11/25/2017 14:14   Ct Head Wo Contrast  Result Date: 11/25/2017 CLINICAL DATA:  Left leg weakness EXAM: CT HEAD WITHOUT CONTRAST TECHNIQUE: Contiguous axial images were obtained from the base of the skull through the vertex without intravenous contrast. COMPARISON:  10/02/2016 FINDINGS: Brain: No evidence of acute infarction, hemorrhage, hydrocephalus, extra-axial collection or mass lesion/mass effect. Vascular: No hyperdense vessel or unexpected calcification. Skull: Bilateral posterior parietal burr holes are noted. No acute bony abnormality is seen. Sinuses/Orbits: No acute finding. Other: None. IMPRESSION: No acute intracranial abnormality noted. Electronically  Signed   By: Alcide Clever M.D.   On: 11/25/2017 19:16   Ct Angio Chest Pe W And/or Wo Contrast  Result Date: 11/25/2017 CLINICAL DATA:  Chest pain and weakness, positive D-dimer EXAM: CT ANGIOGRAPHY CHEST WITH CONTRAST TECHNIQUE: Multidetector CT imaging of the chest was performed using the standard protocol during bolus administration of intravenous contrast. Multiplanar CT image reconstructions and MIPs were obtained to evaluate the vascular anatomy. CONTRAST:  ISOVUE-370 IOPAMIDOL (ISOVUE-370) INJECTION 76% COMPARISON:  Plain film from earlier in the same day, CT of the chest from 09/05/2017 FINDINGS: Cardiovascular: Opacification of the pulmonary artery is sub optimal. No large central pulmonary embolus is seen. Thoracic aorta is within normal limits. No cardiac enlargement is seen. Mediastinum/Nodes: No enlarged mediastinal, hilar, or axillary lymph nodes. Thyroid gland, trachea, and esophagus demonstrate no significant findings. Lungs/Pleura: Lungs are well  aerated bilaterally. Mild emphysematous changes are noted. Some mosaic attenuation is again identified and unchanged. No focal parenchymal nodule or confluent infiltrate is seen. Upper Abdomen: Fatty infiltration of the liver is noted. Musculoskeletal: Degenerative change of the thoracic spine is seen. Review of the MIP images confirms the above findings. IMPRESSION: No evidence of large central pulmonary embolus. The pulmonary arterial opacification is suboptimal. No acute infiltrate.  Stable mosaic attenuation is noted. Fatty infiltration of the liver. Electronically Signed   By: Alcide Clever M.D.   On: 11/25/2017 21:02    Procedures Procedures (including critical care time)  Medications Ordered in ED Medications  LORazepam (ATIVAN) injection 1 mg (1 mg Intravenous Given 11/25/17 1513)  acetaminophen-codeine (TYLENOL #3) 300-30 MG per tablet 1 tablet (1 tablet Oral Given 11/25/17 1850)  ondansetron (ZOFRAN) injection 4 mg (4 mg Intravenous Given 11/25/17 1851)  sodium chloride 0.9 % bolus 1,000 mL (0 mLs Intravenous Stopped 11/25/17 2147)  iopamidol (ISOVUE-370) 76 % injection 100 mL (100 mLs Intravenous Contrast Given 11/25/17 2004)  meclizine (ANTIVERT) tablet 25 mg (25 mg Oral Given 11/25/17 2147)     Initial Impression / Assessment and Plan / ED Course  I have reviewed the triage vital signs and the nursing notes.  Pertinent labs & imaging results that were available during my care of the patient were reviewed by me and considered in my medical decision making (see chart for details).     32 year old female with history of morbid obesity, severe needle phobia, asthma, HTN, depression, anxiety, osteoarthritis, diabetes mellitus type 2, and rheumatoid arthritis who presents to the emergency department with multiple complaints.  EKG with QTC 495 today; previously 448 on 3/27.  Sinus tachycardia.  SaO2 with good waveform on the monitor from 93 to 96% on room air.  Patient was placed on 2 L  nasal cannula by nursing staff for comfort. Chest x-ray is negative.  Troponin is negative.  No orthostatic hypotension.  D-dimer greater than 20, CTA is negative for PE.   Afebrile on rectal temp.  Blood cultures were obtained by nursing staff due to concern for sepsis.  Labs are otherwise notable for mild hypokalemia at 3.2 and mild ketonuria and proteinuria on UA.  No leukocytosis.  IV fluid bolus given in the ED.  The patient is requesting her home Tylenol #3 for pain control.  CT head is negative.  No focal findings on neurologic exam.  Gait initially deferred on exam, nursing staff later reports that the patient was also deferred ambulation in the ED prior to discharge due to pain in her joints.  She continues to maintain oxygen saturation  between 93 to 96% on room air.  She has been successfully fluid challenged.  No emesis since arrival in the ED.  Doubt ACS, PE, cardiac tamponade, myocarditis, pneumonia, meningitis, CVA, ICH, SAH, encephalitis, orcellulitis.  Discussed with the patient that I am concerned that some of her new symptoms may be secondary to Plaquenil.  I recommended that she follow-up with her primary care provider for reevaluation.  Given prolonged QTC, recommended that she discontinue Plaquenil until she is seen by primary care.  Will discharge the patient home on a prednisone taper and meclizine for dizziness.  She was given strict return precautions to the emergency department.  At this time she is hemodynamically stable and in no distress.  Final Clinical Impressions(s) / ED Diagnoses   Final diagnoses:  Prolonged Q-T interval on ECG  Hydroxychloroquine causing adverse effect in therapeutic use, initial encounter    ED Discharge Orders        Ordered    meclizine (ANTIVERT) 25 MG tablet  3 times daily PRN     11/25/17 2159    predniSONE (STERAPRED UNI-PAK 21 TAB) 10 MG (21) TBPK tablet  Daily     11/25/17 2159        Frederik Pear A, PA-C 11/27/17 0030      Nira Conn, MD 11/27/17 2318

## 2017-11-28 DIAGNOSIS — L97812 Non-pressure chronic ulcer of other part of right lower leg with fat layer exposed: Secondary | ICD-10-CM | POA: Diagnosis not present

## 2017-11-29 MED FILL — !LANTUS 100 UNITS/ML VIAL: 100 | 27 days supply | Qty: 10 | Fill #1

## 2017-11-30 LAB — CULTURE, BLOOD (ROUTINE X 2)
CULTURE: NO GROWTH
Culture: NO GROWTH
Special Requests: ADEQUATE

## 2017-12-03 ENCOUNTER — Encounter: Payer: Self-pay | Admitting: Internal Medicine

## 2017-12-04 ENCOUNTER — Other Ambulatory Visit: Payer: Self-pay | Admitting: Critical Care Medicine

## 2017-12-04 MED FILL — HYDROCHLOROTHIAZIDE 25 MG T: 25 | 30 days supply | Qty: 30 | Fill #2

## 2017-12-04 MED FILL — METFORMIN HCL ER 500 MG TAB: 500 | 30 days supply | Qty: 90 | Fill #2 | Status: TO

## 2017-12-04 MED FILL — ?TIZANIDINE HCL 4MG TABLETS: 4 | 15 days supply | Qty: 60 | Fill #1

## 2017-12-05 ENCOUNTER — Other Ambulatory Visit: Payer: Self-pay

## 2017-12-05 ENCOUNTER — Other Ambulatory Visit: Payer: Self-pay | Admitting: *Deleted

## 2017-12-05 DIAGNOSIS — E1165 Type 2 diabetes mellitus with hyperglycemia: Principal | ICD-10-CM

## 2017-12-05 DIAGNOSIS — IMO0001 Reserved for inherently not codable concepts without codable children: Secondary | ICD-10-CM

## 2017-12-05 MED ORDER — CETIRIZINE HCL 10 MG PO TABS: 10.0000 mg | ORAL_TABLET | Freq: Every day | ORAL | 2 refills | Status: DC

## 2017-12-05 MED ORDER — LIRAGLUTIDE 18 MG/3ML ~~LOC~~ SOPN: 1.2000 mg | PEN_INJECTOR | Freq: Every day | SUBCUTANEOUS | 6 refills | Status: DC

## 2017-12-05 MED FILL — !VICTOZA 18MG/3ML INJECT: 18 | 30 days supply | Qty: 6 | Fill #0

## 2017-12-05 MED FILL — ?CETIRIZINE HCL 10 MG TABLE: 10 | 30 days supply | Qty: 30 | Fill #0

## 2017-12-05 NOTE — Telephone Encounter (Signed)
Patient verified DOB Patient was approved for refill on Victoza.

## 2017-12-06 ENCOUNTER — Ambulatory Visit: Payer: Medicaid Other | Attending: Internal Medicine | Admitting: Family Medicine

## 2017-12-06 ENCOUNTER — Encounter: Payer: Self-pay | Admitting: Internal Medicine

## 2017-12-06 ENCOUNTER — Telehealth: Payer: Self-pay | Admitting: Internal Medicine

## 2017-12-06 ENCOUNTER — Encounter: Payer: Self-pay | Admitting: Family Medicine

## 2017-12-06 VITALS — BP 163/89 | HR 120 | Temp 97.8°F | Ht 64.0 in | Wt >= 6400 oz

## 2017-12-06 DIAGNOSIS — Z794 Long term (current) use of insulin: Secondary | ICD-10-CM | POA: Diagnosis not present

## 2017-12-06 DIAGNOSIS — E785 Hyperlipidemia, unspecified: Secondary | ICD-10-CM | POA: Diagnosis not present

## 2017-12-06 DIAGNOSIS — Z881 Allergy status to other antibiotic agents status: Secondary | ICD-10-CM | POA: Insufficient documentation

## 2017-12-06 DIAGNOSIS — Z91048 Other nonmedicinal substance allergy status: Secondary | ICD-10-CM | POA: Diagnosis not present

## 2017-12-06 DIAGNOSIS — L97812 Non-pressure chronic ulcer of other part of right lower leg with fat layer exposed: Secondary | ICD-10-CM | POA: Diagnosis not present

## 2017-12-06 DIAGNOSIS — Z91030 Bee allergy status: Secondary | ICD-10-CM | POA: Insufficient documentation

## 2017-12-06 DIAGNOSIS — K1379 Other lesions of oral mucosa: Secondary | ICD-10-CM | POA: Diagnosis present

## 2017-12-06 DIAGNOSIS — K120 Recurrent oral aphthae: Secondary | ICD-10-CM | POA: Insufficient documentation

## 2017-12-06 DIAGNOSIS — E669 Obesity, unspecified: Secondary | ICD-10-CM | POA: Insufficient documentation

## 2017-12-06 DIAGNOSIS — L97509 Non-pressure chronic ulcer of other part of unspecified foot with unspecified severity: Secondary | ICD-10-CM | POA: Diagnosis not present

## 2017-12-06 DIAGNOSIS — Z886 Allergy status to analgesic agent status: Secondary | ICD-10-CM | POA: Insufficient documentation

## 2017-12-06 DIAGNOSIS — Z9889 Other specified postprocedural states: Secondary | ICD-10-CM | POA: Insufficient documentation

## 2017-12-06 DIAGNOSIS — M1710 Unilateral primary osteoarthritis, unspecified knee: Secondary | ICD-10-CM | POA: Insufficient documentation

## 2017-12-06 DIAGNOSIS — Z79899 Other long term (current) drug therapy: Secondary | ICD-10-CM | POA: Insufficient documentation

## 2017-12-06 DIAGNOSIS — E11621 Type 2 diabetes mellitus with foot ulcer: Secondary | ICD-10-CM

## 2017-12-06 DIAGNOSIS — J45909 Unspecified asthma, uncomplicated: Secondary | ICD-10-CM | POA: Diagnosis not present

## 2017-12-06 DIAGNOSIS — F419 Anxiety disorder, unspecified: Secondary | ICD-10-CM | POA: Diagnosis not present

## 2017-12-06 DIAGNOSIS — I10 Essential (primary) hypertension: Secondary | ICD-10-CM | POA: Insufficient documentation

## 2017-12-06 DIAGNOSIS — Z888 Allergy status to other drugs, medicaments and biological substances status: Secondary | ICD-10-CM | POA: Insufficient documentation

## 2017-12-06 DIAGNOSIS — F329 Major depressive disorder, single episode, unspecified: Secondary | ICD-10-CM | POA: Diagnosis not present

## 2017-12-06 DIAGNOSIS — K219 Gastro-esophageal reflux disease without esophagitis: Secondary | ICD-10-CM | POA: Insufficient documentation

## 2017-12-06 DIAGNOSIS — Z7952 Long term (current) use of systemic steroids: Secondary | ICD-10-CM | POA: Diagnosis not present

## 2017-12-06 LAB — GLUCOSE, POCT (MANUAL RESULT ENTRY): POC Glucose: 137 mg/dl — AB (ref 70–99)

## 2017-12-06 MED ORDER — CLINDAMYCIN HCL 300 MG PO CAPS: 300.0000 mg | ORAL_CAPSULE | Freq: Two times a day (BID) | ORAL | 0 refills | Status: DC

## 2017-12-06 MED ORDER — LIDOCAINE VISCOUS HCL 2 % MT SOLN: 10.0000 mL | Freq: Four times a day (QID) | OROMUCOSAL | 0 refills | Status: DC | PRN

## 2017-12-06 MED FILL — LIDOCAINE 2% VISCOUS SOLN: 2 | 2 days supply | Qty: 100 | Fill #0 | Status: TO

## 2017-12-06 MED FILL — CLINDAMYCIN HCL 300 MG CAP: 300 | 10 days supply | Qty: 20 | Fill #0

## 2017-12-06 NOTE — Patient Instructions (Signed)
Canker Sores Canker sores are small, painful sores that develop inside your mouth. They may also be called aphthous ulcers. You can get canker sores on the inside of your lips or cheeks, on your tongue, or anywhere inside your mouth. You can have just one canker sore or several of them. Canker sores cannot be passed from one person to another (noncontagious). These sores are different than the sores that you may get on the outside of your lips (cold sores or fever blisters). Canker sores usually start as painful red bumps. Then they turn into small white, yellow, or gray ulcers that have red borders. The ulcers may be quite painful. The pain may be worse when you eat or drink. What are the causes? The cause of this condition is not known. What increases the risk? This condition is more likely to develop in:  Women.  People in their teens or 20s.  Women who are having their menstrual period.  People who are under a lot of emotional stress.  People who do not get enough iron or B vitamins.  People who have poor oral hygiene.  People who have an injury inside the mouth. This can happen after having dental work or from chewing something hard.  What are the signs or symptoms? Along with the canker sore, symptoms may also include:  Fever.  Fatigue.  Swollen lymph nodes in your neck.  How is this diagnosed? This condition can be diagnosed based on your symptoms. Your health care provider will also examine your mouth. Your health care provider may also do tests if you get canker sores often or if they are very bad. Tests may include:  Blood tests to rule out other causes of canker sores.  Taking swabs from the sore to check for infection.  Taking a small piece of skin from the sore (biopsy) to test it for cancer.  How is this treated? Most canker sores clear up without treatment in about 10 days. Home care is usually the only treatment that you will need. Over-the-counter medicines  can relieve discomfort.If you have severe canker sores, your health care provider may prescribe:  Numbing ointment to relieve pain.  Vitamins.  Steroid medicines. These may be given as: ? Oral pills. ? Mouth rinses. ? Gels.  Antibiotic mouth rinse.  Follow these instructions at home:  Apply, take, or use medicines only as directed by your health care provider. These include vitamins.  If you were prescribed an antibiotic mouth rinse, finish all of it even if you start to feel better.  Until the sores are healed: ? Do not drink coffee or citrus juices. ? Do not eat spicy or salty foods.  Use a mild, over-the-counter mouth rinse as directed by your health care provider.  Practice good oral hygiene. ? Floss your teeth every day. ? Brush your teeth with a soft brush twice each day. Contact a health care provider if:  Your symptoms do not get better after two weeks.  You also have a fever or swollen glands.  You get canker sores often.  You have a canker sore that is getting larger.  You cannot eat or drink due to your canker sores. This information is not intended to replace advice given to you by your health care provider. Make sure you discuss any questions you have with your health care provider. Document Released: 10/29/2010 Document Revised: 12/10/2015 Document Reviewed: 06/04/2014 Elsevier Interactive Patient Education  2018 [employer]  

## 2017-12-06 NOTE — Telephone Encounter (Signed)
Called patient and told her that we did not have any appointments for today, but that I would call her if we had any cancellations through out the day. Patient was scheduled for 12/07/17 @ 4:10 with her PCP.

## 2017-12-06 NOTE — Progress Notes (Signed)
Subjective:  Patient ID: Courtney Zamora, female    DOB: [birth year]  Age: 32 y.o. MRN: [id number]  CC: Oral Pain   HPI Courtney Zamora is a 32 year old female with a history of type 2 diabetes mellitus (A1c 8.3 from 11/17/2017), hyperlipidemia who presents today for an acute visit complaining of a "hole in her mouth".   One week ago while eating cereal she felt it scrape her hard palate and ever since then it has felt inflamed with associated pain with anything that comes in contact with that part of her mouth. She also feels a hole when she rubs her tongue against the roof of her mouth.  Denies fever, chills, body aches.  Past Medical History:  Diagnosis Date  . Anxiety   . Asthma   . Depression   . Essential hypertension   . GERD (gastroesophageal reflux disease)   . Obesity   . Osteoarthritis, knee   . Seizures (HCC)   . Severe needle phobia     Past Surgical History:  Procedure Laterality Date  . TONSILLECTOMY      Allergies  Allergen Reactions  . Bee Venom Anaphylaxis  . Cinnamon Anaphylaxis  . Doxycycline Hives  . Ibuprofen Other (See Comments)    Abdominal pain  . Lexapro [Escitalopram Oxalate] Other (See Comments)    Generalized body shaking. ? sz  . Tape Hives, Rash and Other (See Comments)    EKG STICKERS     Outpatient Medications Prior to Visit  Medication Sig Dispense Refill  . acetaminophen-codeine (TYLENOL #3) 300-30 MG tablet Take 1 tablet by mouth every 6 (six) hours as needed for moderate pain. 120 tablet 0  . albuterol (PROVENTIL HFA;VENTOLIN HFA) 108 (90 Base) MCG/ACT inhaler Inhale 2 puffs into the lungs every 6 (six) hours as needed for wheezing or shortness of breath (wheezing). For shortness of breath. (Patient taking differently: Inhale 2 puffs into the lungs every 6 (six) hours as needed for wheezing or shortness of breath. ) 1 Inhaler 0  . albuterol (PROVENTIL) (2.5 MG/3ML) 0.083% nebulizer solution Take 3 mLs (2.5 mg total) by nebulization  every 6 (six) hours as needed for wheezing or shortness of breath. 150 mL 1  . Biotin w/ Vitamins C & E (HAIR/SKIN/NAILS PO) Take 1 tablet by mouth daily.    . Calcium Citrate 200 MG TABS Take 2 tablets (400 mg total) by mouth every other day. (Patient taking differently: Take 200 mg by mouth daily. )    . cetirizine (ZYRTEC) 10 MG tablet Take 1 tablet (10 mg total) by mouth at bedtime. 30 tablet 2  . diclofenac (VOLTAREN) 75 MG EC tablet Take 1 tablet (75 mg total) by mouth 2 (two) times daily as needed for mild pain. (Patient taking differently: Take 75 mg by mouth 2 (two) times daily. ) 60 tablet 2  . DULoxetine (CYMBALTA) 30 MG capsule Take 1 capsule (30 mg total) by mouth 2 (two) times daily. 60 capsule 6  . furosemide (LASIX) 20 MG tablet 1 tab PO every other day (Patient taking differently: Take 20 mg by mouth every other day. ) 30 tablet 2  . gabapentin (NEURONTIN) 300 MG capsule TAKE ONE CAPSULE BY MOUTH IN THE MORNING, ONE CAPSULE AT NOON AND TWO CAPSULES AT BEDTIME. (Patient taking differently: Take 300-600 mg by mouth See admin instructions. Take 300 mg by mouth in the morning, take 300 mg by mouth at noon and take 600 mg by mouth at bedtime) 120 capsule 4  .  hydrochlorothiazide (HYDRODIURIL) 25 MG tablet Take 1 tablet (25 mg total) by mouth daily. 90 tablet 3  . hydroxychloroquine (PLAQUENIL) 200 MG tablet 1 tab daily PO x 1 wk then 1 tab PO BID 60 tablet 1  . hydrOXYzine (ATARAX/VISTARIL) 10 MG tablet Take 1 tablet (10 mg total) by mouth 3 (three) times daily as needed for anxiety. 30 tablet 0  . insulin aspart (NOVOLOG) 100 UNIT/ML injection Inject 15 Units into the skin 3 (three) times daily before meals. (Patient taking differently: Inject 15-17 Units into the skin 3 (three) times daily before meals. Inject 15 units in the am, Inject 17 units at lunch and Inject 15 units in the evening.) 10 mL 11  . insulin glargine (LANTUS) 100 UNIT/ML injection Inject 0.37 mLs (37 Units total) into the  skin at bedtime. (Patient taking differently: Inject 52 Units into the skin at bedtime. If BS > 130 increase by 2 units for 3 days) 10 mL 4  . ipratropium-albuterol (DUONEB) 0.5-2.5 (3) MG/3ML SOLN Take 3 mLs by nebulization every 6 (six) hours as needed. (Patient taking differently: Take 3 mLs by nebulization every 4 (four) hours as needed (for shortness of breath or wheezing). ) 360 mL 5  . liraglutide (VICTOZA) 18 MG/3ML SOPN Inject 0.2 mLs (1.2 mg total) into the skin daily. 3 mL 6  . meclizine (ANTIVERT) 25 MG tablet Take 1 tablet (25 mg total) by mouth 3 (three) times daily as needed for dizziness. 30 tablet 0  . metFORMIN (GLUCOPHAGE-XR) 500 MG 24 hr tablet 2 tabs PO Q a.m and 1 tab Q p.m (Patient taking differently: Take 500-1,000 mg by mouth See admin instructions. Take 1000 mg by mouth in the morning and take 500 mg by mouth at bedtime) 90 tablet 6  . montelukast (SINGULAIR) 10 MG tablet Take 1 tablet (10 mg total) by mouth daily. 30 tablet 5  . Multiple Vitamins-Minerals (WOMENS MULTI) CAPS Take 1 capsule by mouth daily.    . pantoprazole (PROTONIX) 40 MG tablet Take 1 tablet (40 mg total) by mouth 2 (two) times daily before a meal. 60 tablet 3  . predniSONE (STERAPRED UNI-PAK 21 TAB) 10 MG (21) TBPK tablet Take by mouth daily. Take 6 tabs by mouth daily  for 2 days, then 5 tabs for 2 days, then 4 tabs for 2 days, then 3 tabs for 2 days, 2 tabs for 2 days, then 1 tab by mouth daily for 2 days 42 tablet 0  . promethazine (PHENERGAN) 25 MG tablet Take 1 tablet (25 mg total) by mouth every 8 (eight) hours as needed for nausea or vomiting. 2060 tablet 2  . tiZANidine (ZANAFLEX) 4 MG tablet TAKE 1 TABLET BY MOUTH EVERY 6 HOURS AS NEEDED FOR MUSCLE SPASMS. 60 tablet 2  . Insulin Pen Needle (PEN NEEDLES) 31G X 8 MM MISC Use as directed (Patient not taking: Reported on 11/16/2017) 100 each 3  . Respiratory Therapy Supplies (FLUTTER) DEVI As directed. (Patient not taking: Reported on 11/16/2017) 1 each 0    . Spacer/Aero-Holding Chambers (AEROCHAMBER MV) inhaler Use as instructed (Patient not taking: Reported on 11/16/2017) 1 each 0  . TRUEPLUS LANCETS 28G MISC Check sugar 3-4 times a day before meals (Patient not taking: Reported on 11/16/2017) 100 each 12   No facility-administered medications prior to visit.     ROS Review of Systems  Constitutional: Negative for activity change, appetite change and fatigue.  HENT: Negative for congestion, sinus pressure and sore throat.   Eyes:  Negative for visual disturbance.  Respiratory: Negative for cough, chest tightness, shortness of breath and wheezing.   Cardiovascular: Negative for chest pain and palpitations.  Gastrointestinal: Negative for abdominal distention, abdominal pain and constipation.  Endocrine: Negative for polydipsia.  Genitourinary: Negative for dysuria and frequency.  Musculoskeletal: Negative for arthralgias and back pain.  Skin: Negative for rash.  Neurological: Negative for tremors, light-headedness and numbness.  Hematological: Does not bruise/bleed easily.  Psychiatric/Behavioral: Negative for agitation and behavioral problems.    Objective:  BP (!) 163/89   Pulse (!) 120   Temp 97.8 F (36.6 C) (Oral)   Ht 5\' 4"  (1.626 m)   Wt (!) 409 lb (185.5 kg)   LMP 11/15/2017 (Exact Date)   SpO2 99%   BMI 70.20 kg/m   BP/Weight 12/06/2017 11/25/2017 11/17/2017  Systolic BP 163 149 137  Diastolic BP 89 67 80  Wt. (Lbs) 409 411 413  BMI 70.2 70.55 70.89      Physical Exam  Constitutional: She is oriented to person, place, and time. She appears well-developed and well-nourished.  Morbidly obese  HENT:  Aphthous ulcer on right posterior hard palate of mouth, no evidence of abscess formation  Cardiovascular: Normal heart sounds and intact distal pulses. Tachycardia present.  No murmur heard. Pulmonary/Chest: Effort normal and breath sounds normal. She has no wheezes. She has no rales. She exhibits no tenderness.   Abdominal: Soft. Bowel sounds are normal. She exhibits no distension and no mass. There is no tenderness.  Musculoskeletal: Normal range of motion.  Lymphadenopathy:    She has no cervical adenopathy.  Neurological: She is alert and oriented to person, place, and time.    Lab Results  Component Value Date   HGBA1C 8.3 11/17/2017    Assessment & Plan:   1. Type 2 diabetes mellitus with foot ulcer, without long-term current use of insulin (HCC) Controlled with A1c of 8.3 Regimen was adjusted by PCP at last office visit - POCT glucose (manual entry)  2. Aphthous ulcer of mouth Given history of diabetes mellitus, I will place her on antibiotic prophylactically - clindamycin (CLEOCIN) 300 MG capsule; Take 1 capsule (300 mg total) by mouth 2 (two) times daily.  Dispense: 20 capsule; Refill: 0 - lidocaine (XYLOCAINE) 2 % solution; Use as directed 10 mLs in the mouth or throat every 6 (six) hours as needed for mouth pain.  Dispense: 120 mL; Refill: 0   Meds ordered this encounter  Medications  . clindamycin (CLEOCIN) 300 MG capsule    Sig: Take 1 capsule (300 mg total) by mouth 2 (two) times daily.    Dispense:  20 capsule    Refill:  0  . lidocaine (XYLOCAINE) 2 % solution    Sig: Use as directed 10 mLs in the mouth or throat every 6 (six) hours as needed for mouth pain.    Dispense:  120 mL    Refill:  0    Follow-up: Return for follow up of chronic medical conditions, keep previously scheduled appointment with PCP.01/17/2018   Marland Kitchen MD

## 2017-12-07 ENCOUNTER — Ambulatory Visit: Payer: Self-pay | Admitting: Internal Medicine

## 2017-12-07 MED FILL — MONTELUKAST SOD 10 MG TAB: 10 | 30 days supply | Qty: 30 | Fill #4

## 2017-12-07 MED FILL — ACETAMINOPHEN/COD #3 TABLET: 300-30 | 30 days supply | Qty: 120 | Fill #0

## 2017-12-07 MED FILL — FUROSEMIDE 20 MG TABLET: 20 | 30 days supply | Qty: 15 | Fill #2

## 2017-12-08 ENCOUNTER — Other Ambulatory Visit: Payer: Self-pay

## 2017-12-08 DIAGNOSIS — F0781 Postconcussional syndrome: Secondary | ICD-10-CM

## 2017-12-08 MED ORDER — PROMETHAZINE HCL 25 MG PO TABS: 25.0000 mg | ORAL_TABLET | Freq: Three times a day (TID) | ORAL | 2 refills | Status: DC | PRN

## 2017-12-08 MED FILL — PROMETHAZINE 25 MG TABLET: 25 | 10 days supply | Qty: 30 | Fill #0 | Status: TO

## 2017-12-15 ENCOUNTER — Other Ambulatory Visit: Payer: Self-pay

## 2017-12-15 DIAGNOSIS — E1165 Type 2 diabetes mellitus with hyperglycemia: Principal | ICD-10-CM

## 2017-12-15 DIAGNOSIS — IMO0001 Reserved for inherently not codable concepts without codable children: Secondary | ICD-10-CM

## 2017-12-15 MED FILL — !NOVOLOG 100UNITS/ML VIAL: 100/ML | 22 days supply | Qty: 10 | Fill #2

## 2017-12-18 MED FILL — !LANTUS 100 UNITS/ML VIAL: 100 | 27 days supply | Qty: 10 | Fill #2

## 2017-12-19 ENCOUNTER — Encounter (HOSPITAL_BASED_OUTPATIENT_CLINIC_OR_DEPARTMENT_OTHER): Payer: Medicaid Other | Attending: Internal Medicine

## 2017-12-19 ENCOUNTER — Encounter: Payer: Self-pay | Admitting: Internal Medicine

## 2017-12-19 DIAGNOSIS — G4730 Sleep apnea, unspecified: Secondary | ICD-10-CM | POA: Insufficient documentation

## 2017-12-19 DIAGNOSIS — E11622 Type 2 diabetes mellitus with other skin ulcer: Secondary | ICD-10-CM | POA: Insufficient documentation

## 2017-12-19 DIAGNOSIS — E669 Obesity, unspecified: Secondary | ICD-10-CM | POA: Insufficient documentation

## 2017-12-19 DIAGNOSIS — I890 Lymphedema, not elsewhere classified: Secondary | ICD-10-CM | POA: Insufficient documentation

## 2017-12-19 DIAGNOSIS — L97811 Non-pressure chronic ulcer of other part of right lower leg limited to breakdown of skin: Secondary | ICD-10-CM | POA: Insufficient documentation

## 2017-12-19 DIAGNOSIS — I10 Essential (primary) hypertension: Secondary | ICD-10-CM | POA: Insufficient documentation

## 2017-12-19 DIAGNOSIS — K7460 Unspecified cirrhosis of liver: Secondary | ICD-10-CM | POA: Insufficient documentation

## 2017-12-19 DIAGNOSIS — M069 Rheumatoid arthritis, unspecified: Secondary | ICD-10-CM | POA: Insufficient documentation

## 2017-12-19 DIAGNOSIS — Z6841 Body Mass Index (BMI) 40.0 and over, adult: Secondary | ICD-10-CM | POA: Diagnosis not present

## 2017-12-20 ENCOUNTER — Encounter: Payer: Self-pay | Admitting: Internal Medicine

## 2017-12-21 ENCOUNTER — Encounter: Payer: Self-pay | Admitting: Internal Medicine

## 2017-12-21 ENCOUNTER — Ambulatory Visit: Payer: Medicaid Other | Attending: Internal Medicine | Admitting: Internal Medicine

## 2017-12-21 ENCOUNTER — Other Ambulatory Visit: Payer: Self-pay

## 2017-12-21 VITALS — BP 134/83 | HR 118 | Temp 98.4°F | Resp 16 | Wt >= 6400 oz

## 2017-12-21 DIAGNOSIS — N911 Secondary amenorrhea: Secondary | ICD-10-CM | POA: Diagnosis not present

## 2017-12-21 DIAGNOSIS — Z7952 Long term (current) use of systemic steroids: Secondary | ICD-10-CM | POA: Diagnosis not present

## 2017-12-21 DIAGNOSIS — F419 Anxiety disorder, unspecified: Secondary | ICD-10-CM | POA: Insufficient documentation

## 2017-12-21 DIAGNOSIS — R000 Tachycardia, unspecified: Secondary | ICD-10-CM

## 2017-12-21 DIAGNOSIS — F329 Major depressive disorder, single episode, unspecified: Secondary | ICD-10-CM | POA: Insufficient documentation

## 2017-12-21 DIAGNOSIS — E119 Type 2 diabetes mellitus without complications: Secondary | ICD-10-CM | POA: Insufficient documentation

## 2017-12-21 DIAGNOSIS — Z6841 Body Mass Index (BMI) 40.0 and over, adult: Secondary | ICD-10-CM | POA: Diagnosis not present

## 2017-12-21 DIAGNOSIS — Z79899 Other long term (current) drug therapy: Secondary | ICD-10-CM | POA: Diagnosis not present

## 2017-12-21 DIAGNOSIS — Z794 Long term (current) use of insulin: Secondary | ICD-10-CM | POA: Diagnosis not present

## 2017-12-21 DIAGNOSIS — M0579 Rheumatoid arthritis with rheumatoid factor of multiple sites without organ or systems involvement: Secondary | ICD-10-CM | POA: Diagnosis not present

## 2017-12-21 DIAGNOSIS — I10 Essential (primary) hypertension: Secondary | ICD-10-CM | POA: Insufficient documentation

## 2017-12-21 MED ORDER — FOLIC ACID 1 MG PO TABS: 1.0000 mg | ORAL_TABLET | Freq: Every day | ORAL | 1 refills | Status: DC

## 2017-12-21 MED ORDER — METHOTREXATE 2.5 MG PO TABS: 7.5000 mg | ORAL_TABLET | ORAL | 1 refills | Status: DC

## 2017-12-21 MED ORDER — PREDNISONE 5 MG PO TABS: ORAL_TABLET | ORAL | 1 refills | Status: DC

## 2017-12-21 MED FILL — ?PANTOPRAZOLE SO DR 40MG TA: 40 | 30 days supply | Qty: 60 | Fill #3

## 2017-12-21 MED FILL — ?DULOXETINE HCL 30 MG CPEP: 30 | 30 days supply | Qty: 60 | Fill #3

## 2017-12-21 MED FILL — FOLIC ACID 1 MG TABS: 1 | 30 days supply | Qty: 30 | Fill #0

## 2017-12-21 MED FILL — GABAPENTIN 300 MG CAPSULE: 300 | 30 days supply | Qty: 120 | Fill #2 | Status: TO

## 2017-12-21 MED FILL — predniSONE 5 MG TABS: 5 | 28 days supply | Qty: 16 | Fill #0

## 2017-12-21 MED FILL — METHOTREXATE SODIUM 2.5 MG: 2.5 | 28 days supply | Qty: 12 | Fill #0

## 2017-12-21 NOTE — Progress Notes (Signed)
Patient ID: Courtney Zamora, female    DOB: 04-20-1986  MRN: [id number]  CC: Medication Management   Subjective: Courtney Zamora is a 32 y.o. female who presents for UC visit Her concerns today include:  Patient with history of mod persistent asthma, morbid obesity, HTN, DM type 2, RA, depression/anxiety, chronic knee pain due to meniscus tearand osteoarthritis based on MRI done 04/2016, on pain management agreement for Tylenol #3.    Pt sent me a my chart message wanting to know if something can be done about the pain in her joints.  She has RA and most of the pain is in the hands, wrists knees and ankles.  On last visit I had started her on Plaquenil.  However after being on the medication for less than 2 weeks, he started to experience heart racing and rash on her face along with increased body pain.  She was seen in the emergency room and was found to have increased QT interval on EKG.  The Plaquenil was discontinued as the provider who saw her thought that the prolong QT was likely due to the Plaquenil.  Patient was given a tapering dose of prednisone from ER which she completed last week.  We are awaiting approval of her Medicaid so that we can refer her to rheumatology.  Menses irregular since Nov 2018.  Comes on about  every 2-3 mths.  Currently 3 wks late from last one.   Patient Active Problem List   Diagnosis Date Noted  . Abnormal CT scan, esophagus 09/19/2017  . Dysphagia 09/19/2017  . Elevated LFTs 09/19/2017  . Newly diagnosed diabetes (HCC) 09/15/2017  . Hepatic steatosis 09/15/2017  . DM2 (diabetes mellitus, type 2) (HCC) 09/06/2017  . SOB (shortness of breath) 09/06/2017  . CAP (community acquired pneumonia) 08/03/2017  . Acute frontal sinusitis 12/15/2016  . Metatarsal stress fracture, right, sequela 11/17/2016  . Neck muscle spasm 10/05/2016  . Nipple discharge in female 09/13/2016  . Depression 08/17/2016  . Anxiety 08/17/2016  . ASCUS with positive high risk HPV  cervical 08/14/2016  . Hypertension 08/14/2016  . Long term (current) use of systemic steroids 08/14/2016  . Esophageal reflux 12/29/2015  . Morbid (severe) obesity due to excess calories (HCC) 12/29/2015  . Chronic pain of right knee 12/29/2015  . Chronic pain of left ankle 12/29/2015  . 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 Asthma, moderate persistent 12/09/2015     Current Outpatient Medications on File Prior to Visit  Medication Sig Dispense Refill  . acetaminophen-codeine (TYLENOL #3) 300-30 MG tablet Take 1 tablet by mouth every 6 (six) hours as needed for moderate pain. 120 tablet 0  . albuterol (PROVENTIL HFA;VENTOLIN HFA) 108 (90 Base) MCG/ACT inhaler Inhale 2 puffs into the lungs every 6 (six) hours as needed for wheezing or shortness of breath (wheezing). For shortness of breath. (Patient taking differently: Inhale 2 puffs into the lungs every 6 (six) hours as needed for wheezing or shortness of breath. ) 1 Inhaler 0  . albuterol (PROVENTIL) (2.5 MG/3ML) 0.083% nebulizer solution Take 3 mLs (2.5 mg total) by nebulization every 6 (six) hours as needed for wheezing or shortness of breath. 150 mL 1  . Biotin w/ Vitamins C & E (HAIR/SKIN/NAILS PO) Take 1 tablet by mouth daily.    . Calcium Citrate 200 MG TABS Take 2 tablets (400 mg total) by mouth every other day. (Patient taking differently: Take 200 mg by mouth daily. )    . cetirizine (ZYRTEC) 10  MG tablet Take 1 tablet (10 mg total) by mouth at bedtime. 30 tablet 2  . clindamycin (CLEOCIN) 300 MG capsule Take 1 capsule (300 mg total) by mouth 2 (two) times daily. 20 capsule 0  . diclofenac (VOLTAREN) 75 MG EC tablet Take 1 tablet (75 mg total) by mouth 2 (two) times daily as needed for mild pain. (Patient taking differently: Take 75 mg by mouth 2 (two) times daily. ) 60 tablet 2  . DULoxetine (CYMBALTA) 30 MG capsule Take 1 capsule (30 mg total) by mouth 2 (two) times daily. 60 capsule 6  . furosemide (LASIX) 20 MG tablet 1 tab PO every  other day (Patient taking differently: Take 20 mg by mouth every other day. ) 30 tablet 2  . gabapentin (NEURONTIN) 300 MG capsule TAKE ONE CAPSULE BY MOUTH IN THE MORNING, ONE CAPSULE AT NOON AND TWO CAPSULES AT BEDTIME. (Patient taking differently: Take 300-600 mg by mouth See admin instructions. Take 300 mg by mouth in the morning, take 300 mg by mouth at noon and take 600 mg by mouth at bedtime) 120 capsule 4  . hydrochlorothiazide (HYDRODIURIL) 25 MG tablet Take 1 tablet (25 mg total) by mouth daily. 90 tablet 3  . hydroxychloroquine (PLAQUENIL) 200 MG tablet 1 tab daily PO x 1 wk then 1 tab PO BID 60 tablet 1  . hydrOXYzine (ATARAX/VISTARIL) 10 MG tablet Take 1 tablet (10 mg total) by mouth 3 (three) times daily as needed for anxiety. 30 tablet 0  . insulin aspart (NOVOLOG) 100 UNIT/ML injection Inject 15 Units into the skin 3 (three) times daily before meals. (Patient taking differently: Inject 15-17 Units into the skin 3 (three) times daily before meals. Inject 15 units in the am, Inject 17 units at lunch and Inject 15 units in the evening.) 10 mL 11  . insulin glargine (LANTUS) 100 UNIT/ML injection Inject 0.37 mLs (37 Units total) into the skin at bedtime. (Patient taking differently: Inject 52 Units into the skin at bedtime. If BS > 130 increase by 2 units for 3 days) 10 mL 4  . Insulin Pen Needle (PEN NEEDLES) 31G X 8 MM MISC Use as directed (Patient not taking: Reported on 11/16/2017) 100 each 3  . ipratropium-albuterol (DUONEB) 0.5-2.5 (3) MG/3ML SOLN Take 3 mLs by nebulization every 6 (six) hours as needed. (Patient taking differently: Take 3 mLs by nebulization every 4 (four) hours as needed (for shortness of breath or wheezing). ) 360 mL 5  . lidocaine (XYLOCAINE) 2 % solution Use as directed 10 mLs in the mouth or throat every 6 (six) hours as needed for mouth pain. 120 mL 0  . liraglutide (VICTOZA) 18 MG/3ML SOPN Inject 0.2 mLs (1.2 mg total) into the skin daily. 3 mL 6  . meclizine  (ANTIVERT) 25 MG tablet Take 1 tablet (25 mg total) by mouth 3 (three) times daily as needed for dizziness. 30 tablet 0  . metFORMIN (GLUCOPHAGE-XR) 500 MG 24 hr tablet 2 tabs PO Q a.m and 1 tab Q p.m (Patient taking differently: Take 500-1,000 mg by mouth See admin instructions. Take 1000 mg by mouth in the morning and take 500 mg by mouth at bedtime) 90 tablet 6  . montelukast (SINGULAIR) 10 MG tablet Take 1 tablet (10 mg total) by mouth daily. 30 tablet 5  . Multiple Vitamins-Minerals (WOMENS MULTI) CAPS Take 1 capsule by mouth daily.    . pantoprazole (PROTONIX) 40 MG tablet Take 1 tablet (40 mg total) by mouth 2 (two)  times daily before a meal. 60 tablet 3  . promethazine (PHENERGAN) 25 MG tablet Take 1 tablet (25 mg total) by mouth every 8 (eight) hours as needed for nausea or vomiting. 30 tablet 2  . Respiratory Therapy Supplies (FLUTTER) DEVI As directed. (Patient not taking: Reported on 11/16/2017) 1 each 0  . Spacer/Aero-Holding Chambers (AEROCHAMBER MV) inhaler Use as instructed (Patient not taking: Reported on 11/16/2017) 1 each 0  . tiZANidine (ZANAFLEX) 4 MG tablet TAKE 1 TABLET BY MOUTH EVERY 6 HOURS AS NEEDED FOR MUSCLE SPASMS. 60 tablet 2  . TRUEPLUS LANCETS 28G MISC Check sugar 3-4 times a day before meals (Patient not taking: Reported on 11/16/2017) 100 each 12   No current facility-administered medications on file prior to visit.     Allergies  Allergen Reactions  . Bee Venom Anaphylaxis  . Cinnamon Anaphylaxis  . Doxycycline Hives  . Ibuprofen Other (See Comments)    Abdominal pain  . Lexapro [Escitalopram Oxalate] Other (See Comments)    Generalized body shaking. ? sz  . Tape Hives, Rash and Other (See Comments)    EKG STICKERS    Social History   Socioeconomic History  . Marital status: Married    Spouse name: Not on file  . Number of children: Not on file  . Years of education: Not on file  . Highest education level: Not on file  Occupational History  . Not on  file  Social Needs  . Financial resource strain: Not on file  . Food insecurity:    Worry: Not on file    Inability: Not on file  . Transportation needs:    Medical: Not on file    Non-medical: Not on file  Tobacco Use  . Smoking status: Never Smoker  . Smokeless tobacco: Never Used  Substance and Sexual Activity  . Alcohol use: No  . Drug use: No  . Sexual activity: Yes    Birth control/protection: None  Lifestyle  . Physical activity:    Days per week: Not on file    Minutes per session: Not on file  . Stress: Not on file  Relationships  . Social connections:    Talks on phone: Not on file    Gets together: Not on file    Attends religious service: Not on file    Active member of club or organization: Not on file    Attends meetings of clubs or organizations: Not on file    Relationship status: Not on file  . Intimate partner violence:    Fear of current or ex partner: Not on file    Emotionally abused: Not on file    Physically abused: Not on file    Forced sexual activity: Not on file  Other Topics Concern  . Not on file  Social History Narrative  . Not on file    Family History  Problem Relation Age of Onset  . Hypothyroidism Mother   . Diabetes Father   . Diabetes Paternal Uncle   . Hypertension Maternal Grandmother   . Hypertension Maternal Grandfather   . Pancreatic cancer Maternal Grandfather   . Hypertension Paternal Grandmother   . Heart disease Paternal Grandfather   . Hypertension Paternal Grandfather     Past Surgical History:  Procedure Laterality Date  . TONSILLECTOMY      ROS: Review of Systems Neg except as above PHYSICAL EXAM: BP 134/83   Pulse (!) 118   Temp 98.4 F (36.9 C) (Oral)   Resp  16   Wt (!) 407 lb 9.6 oz (184.9 kg)   SpO2 97%   BMI 69.96 kg/m   Physical Exam  General appearance - alert, well appearing, and in no distress Mental status - normal mood, behavior, speech, dress, motor activity, and thought  processes   Lab Results  Component Value Date   TSH 4.440 10/20/2017   EKG:  ST.  QT interval still slightly prolong but dec back to baseline    Chemistry      Component Value Date/Time   NA 140 11/25/2017 1332   NA 141 10/20/2017 1123   K 3.2 (L) 11/25/2017 1332   CL 97 (L) 11/25/2017 1332   CO2 29 11/25/2017 1332   BUN 10 11/25/2017 1332   BUN 23 (H) 10/20/2017 1123   CREATININE 0.72 11/25/2017 1332      Component Value Date/Time   CALCIUM 9.3 11/25/2017 1332   ALKPHOS 58 11/25/2017 1332   AST 30 11/25/2017 1332   ALT 56 (H) 11/25/2017 1332   BILITOT 0.6 11/25/2017 1332     Lab Results  Component Value Date   WBC 10.0 11/25/2017   HGB 10.4 (L) 11/25/2017   HCT 35.5 (L) 11/25/2017   MCV 86.0 11/25/2017   PLT 397 11/25/2017    ASSESSMENT AND PLAN: 1. Rheumatoid arthritis involving multiple sites with positive rheumatoid factor (HCC) -Plaquenil discontinued due to increased QT interval. We are hoping for a favorable response from Medicaid soon at which point we can refer to rheumatology. -In the meantime we discussed other DMARDs options.  I discussed methotrexate with her.  Patient advised that this medication is teratogenic and has to be out of the system at least 3 mths prior to getting pregnant.  Medication can also affect the liver and bone marrow.  Discussed all of the possible side effects with the patient.  When asked where she is mentally in terms of desire for pregnancy patient states that she is not desiring that at this time and wants to work on getting her health issues better controlled.  She is currently not on any method of birth control.  I advised consistent use of condoms.  She is agreeable to being referred to gynecology for the irregular periods which I think likely due to PCOS and to discuss birth control options. Urine pregnancy test done today and was negative. She is agreeable to being started on methotrexate along with folic acid.  I will overlap  this with low dose of prednisone 4 times a week. Had recent CBC and chem.  Mild elev in one of liver enzymes.  Will recheck in about 1 mth - predniSONE (DELTASONE) 5 MG tablet; 1 tab PO 4 x a week.  Dispense: 30 tablet; Refill: 1 - folic acid (FOLVITE) 1 MG tablet; Take 1 tablet (1 mg total) by mouth daily.  Dispense: 30 tablet; Refill: 1 - methotrexate (RHEUMATREX) 2.5 MG tablet; Take 3 tablets (7.5 mg total) by mouth once a week. Caution:Chemotherapy. Protect from light.  Dispense: 12 tablet; Refill: 1  2. Tachycardia, unspecified This has been persistent.  Referral to cardiology for further evaluation  3. Secondary amenorrhea Urine pregnancy test today is negative.  Symptoms suggestive of an ovulatory cycle likely due to PCOS.  Referral to GYN for further management and to discuss better birth control options given that she is being started on methotrexate. .  Addendum:  While in office today, pt check BS because she felt it was low.  It was in the  70s.  Pt given a fruit cuptail with BS at dischg in the 80s checked on her meter.  Patient was given the opportunity to ask questions.  Patient verbalized understanding of the plan and was able to repeat key elements of the plan.   Orders Placed This Encounter  Procedures  . Ambulatory referral to Gynecology  . Ambulatory referral to Cardiology     Requested Prescriptions   Signed Prescriptions Disp Refills  . predniSONE (DELTASONE) 5 MG tablet 30 tablet 1    Sig: 1 tab PO 4 x a week.  . folic acid (FOLVITE) 1 MG tablet 30 tablet 1    Sig: Take 1 tablet (1 mg total) by mouth daily.  . methotrexate (RHEUMATREX) 2.5 MG tablet 12 tablet 1    Sig: Take 3 tablets (7.5 mg total) by mouth once a week. Caution:Chemotherapy. Protect from light.    No follow-ups on file.  Jonah Blue, MD, FACP

## 2017-12-27 ENCOUNTER — Encounter: Payer: Self-pay | Admitting: Internal Medicine

## 2017-12-27 ENCOUNTER — Telehealth: Payer: Self-pay | Admitting: Internal Medicine

## 2017-12-27 NOTE — Telephone Encounter (Signed)
Pt called yesterday to get in touch with me but I was in clinic was unable to speak with her. Pt then called again this morning and I was not in the office front desk sent a message to me. Pt then called again around 3:15pm and informed the front desk she will wait to speak to me because she has not received a call from me   Pt called and stated since she has started the Methotrexate she has been having severe diarrhea. Pt states she has tried taking pepto and imodium and nothing seems to help. Pt states she even stop taking the Diclofenac because she thought that medicine was causing diarrhea as well.   Spoke with Dr. Cato Mulligan about pt and per Dr. Cato Mulligan inform pt to hold the methotrexate next week and the diclofenac and have pt restart both medications the following week. Pt is advised of this information    Just an [place]

## 2017-12-27 NOTE — Telephone Encounter (Signed)
Patient called requesting to speak with her nurse. Please f/u

## 2017-12-28 ENCOUNTER — Encounter: Payer: Self-pay | Admitting: Internal Medicine

## 2017-12-28 NOTE — Telephone Encounter (Signed)
PC placed to pt this a.m.  No answer.  I did not leave a message.  Will send Mychart message.

## 2017-12-29 ENCOUNTER — Ambulatory Visit: Payer: Self-pay | Admitting: Internal Medicine

## 2017-12-29 MED FILL — HYDROCHLOROTHIAZIDE 25 MG T: 25 | 30 days supply | Qty: 30 | Fill #3 | Status: TO

## 2017-12-29 MED FILL — ?CETIRIZINE HCL 10 MG TABLE: 10 | 30 days supply | Qty: 30 | Fill #1 | Status: TO

## 2017-12-29 MED FILL — ?MONTELUKAST SODI 10MG TAB: 10 | 30 days supply | Qty: 30 | Fill #5

## 2017-12-29 MED FILL — ?TIZANIDINE HCL 4MG TABLETS: 4 | 15 days supply | Qty: 60 | Fill #2

## 2018-01-01 ENCOUNTER — Encounter: Payer: Self-pay | Admitting: Internal Medicine

## 2018-01-02 MED FILL — !VICTOZA 18MG/3ML INJECT: 18 | 28 days supply | Qty: 6 | Fill #1

## 2018-01-03 ENCOUNTER — Other Ambulatory Visit: Payer: Self-pay | Admitting: Internal Medicine

## 2018-01-04 ENCOUNTER — Other Ambulatory Visit: Payer: Self-pay | Admitting: Nurse Practitioner

## 2018-01-04 ENCOUNTER — Other Ambulatory Visit: Payer: Self-pay

## 2018-01-04 ENCOUNTER — Other Ambulatory Visit: Payer: Self-pay | Admitting: Critical Care Medicine

## 2018-01-04 NOTE — Telephone Encounter (Signed)
Will forward to pcp

## 2018-01-05 ENCOUNTER — Other Ambulatory Visit: Payer: Self-pay | Admitting: Internal Medicine

## 2018-01-05 ENCOUNTER — Telehealth: Payer: Self-pay | Admitting: Internal Medicine

## 2018-01-05 DIAGNOSIS — M0579 Rheumatoid arthritis with rheumatoid factor of multiple sites without organ or systems involvement: Secondary | ICD-10-CM

## 2018-01-05 DIAGNOSIS — E1165 Type 2 diabetes mellitus with hyperglycemia: Principal | ICD-10-CM

## 2018-01-05 DIAGNOSIS — IMO0001 Reserved for inherently not codable concepts without codable children: Secondary | ICD-10-CM

## 2018-01-05 MED ORDER — PREDNISONE 5 MG PO TABS: 5.0000 mg | ORAL_TABLET | Freq: Every day | ORAL | 1 refills | Status: DC

## 2018-01-05 MED ORDER — ALBUTEROL SULFATE HFA 108 (90 BASE) MCG/ACT IN AERS: 2.0000 | INHALATION_SPRAY | Freq: Four times a day (QID) | RESPIRATORY_TRACT | 11 refills | Status: AC | PRN

## 2018-01-05 MED ORDER — INSULIN GLARGINE 100 UNIT/ML ~~LOC~~ SOLN: 52.0000 [IU] | Freq: Every day | SUBCUTANEOUS | 3 refills | Status: DC

## 2018-01-05 MED ORDER — PREDNISONE 5 MG PO TABS: 5.0000 mg | ORAL_TABLET | Freq: Every day | ORAL | 0 refills | Status: DC

## 2018-01-05 MED ORDER — AEROCHAMBER MV MISC: 0 refills | Status: AC

## 2018-01-05 MED FILL — predniSONE 5 MG TABS: 5 | 30 days supply | Qty: 30 | Fill #0 | Status: TO

## 2018-01-05 MED FILL — !VENTOLIN HFA INHALER: 108 (90 BAS | 25 days supply | Qty: 18 | Fill #0 | Status: TO

## 2018-01-05 MED FILL — !LANTUS 100 UNITS/ML VIAL: 100 | 19 days supply | Qty: 10 | Fill #0

## 2018-01-05 NOTE — Telephone Encounter (Signed)
Patient called saying that her Lantuss has been uped and pharmacy needs new script and she needs prednisone filled.

## 2018-01-05 NOTE — Telephone Encounter (Signed)
Will forward to pcp

## 2018-01-08 ENCOUNTER — Encounter: Payer: Self-pay | Admitting: Internal Medicine

## 2018-01-09 ENCOUNTER — Encounter: Payer: Self-pay | Admitting: Obstetrics & Gynecology

## 2018-01-09 ENCOUNTER — Other Ambulatory Visit: Payer: Self-pay | Admitting: Internal Medicine

## 2018-01-09 MED FILL — FUROSEMIDE 20 MG TABLET: 20 | 30 days supply | Qty: 15 | Fill #3 | Status: TO

## 2018-01-11 ENCOUNTER — Telehealth: Payer: Self-pay

## 2018-01-11 NOTE — Telephone Encounter (Signed)
Contacted pt and left a detailed vm making pt aware that tylenol #3 ready for pickup

## 2018-01-12 MED FILL — ACETAMINOPHEN/COD #3 TABLET: 300-30 | 7 days supply | Qty: 30 | Fill #0

## 2018-01-15 ENCOUNTER — Other Ambulatory Visit: Payer: Self-pay | Admitting: Gastroenterology

## 2018-01-15 MED FILL — FOLIC ACID 1 MG TABS: 1 | 30 days supply | Qty: 30 | Fill #1

## 2018-01-15 MED FILL — METHOTREXATE SODIUM 2.5 MG: 2.5 | 28 days supply | Qty: 12 | Fill #1

## 2018-01-15 MED FILL — DULoxetine HCL 30 MG CPEP: 30 | 30 days supply | Qty: 60 | Fill #4 | Status: TO

## 2018-01-15 MED FILL — GABAPENTIN 300 MG CAPSULE: 300 | 30 days supply | Qty: 120 | Fill #3 | Status: TO

## 2018-01-15 MED FILL — PANTOPRAZOLE SOD DR 40 MG T: 40 | 30 days supply | Qty: 60 | Fill #0 | Status: TO

## 2018-01-17 ENCOUNTER — Encounter (HOSPITAL_COMMUNITY): Payer: Self-pay | Admitting: *Deleted

## 2018-01-17 ENCOUNTER — Telehealth: Payer: Self-pay

## 2018-01-17 NOTE — Telephone Encounter (Signed)
Spoke to patient, tried to get her to move her procedure up in time. She is having difficulty arranging a ride and wishes to cancel her procedure altogether. I have not cancelled her off of the schedule yet since unable to reach scheduling department. Patient states will call back when she feels her schedule/husbands schedule will allow.

## 2018-01-22 ENCOUNTER — Encounter: Payer: Self-pay | Admitting: Internal Medicine

## 2018-01-22 ENCOUNTER — Ambulatory Visit (HOSPITAL_COMMUNITY): Admission: RE | Admit: 2018-01-22 | Payer: Medicaid Other | Source: Ambulatory Visit | Admitting: Gastroenterology

## 2018-01-22 SURGERY — ESOPHAGOGASTRODUODENOSCOPY (EGD) WITH PROPOFOL
Anesthesia: Monitor Anesthesia Care

## 2018-01-22 MED FILL — ACETAMINOPHEN/COD #3 TABLET: 300-30 | 7 days supply | Qty: 30 | Fill #1 | Status: TO

## 2018-01-22 MED FILL — LANTUS 100 UNITS/ML VIAL: 100 | 28 days supply | Qty: 10 | Fill #1 | Status: TO

## 2018-01-23 ENCOUNTER — Encounter: Payer: Self-pay | Admitting: Internal Medicine

## 2018-01-24 ENCOUNTER — Telehealth: Payer: Self-pay

## 2018-01-24 DIAGNOSIS — M0579 Rheumatoid arthritis with rheumatoid factor of multiple sites without organ or systems involvement: Secondary | ICD-10-CM

## 2018-01-24 NOTE — Addendum Note (Signed)
Addended by: Jonah Blue B on: 01/24/2018 06:46 PM   Modules accepted: Orders

## 2018-01-24 NOTE — Telephone Encounter (Signed)
Pt contacted the office statin she Dr. Laural Benes a message 2 days ago and she has not had a response. I informed pt that she does be in clinic all day Monday-Friday but her admin day is Wednesday and tries her best to response to [employer]. Informed pt that I will send pcp a phone message

## 2018-01-25 ENCOUNTER — Other Ambulatory Visit: Payer: Self-pay | Admitting: Internal Medicine

## 2018-01-25 DIAGNOSIS — M25552 Pain in left hip: Secondary | ICD-10-CM

## 2018-01-25 NOTE — Telephone Encounter (Signed)
Contacted pt and went over Dr. Laural Benes response. Pt states it is her left hip. Pt states it is basically her entire left leg

## 2018-01-29 ENCOUNTER — Emergency Department (HOSPITAL_COMMUNITY)
Admission: EM | Admit: 2018-01-29 | Discharge: 2018-01-29 | Disposition: A | Discharge status: Home / Self Care | Payer: Medicaid Other | Attending: Emergency Medicine | Admitting: Emergency Medicine

## 2018-01-29 ENCOUNTER — Encounter: Payer: Self-pay | Admitting: Obstetrics & Gynecology

## 2018-01-29 ENCOUNTER — Encounter (HOSPITAL_COMMUNITY): Payer: Self-pay

## 2018-01-29 ENCOUNTER — Other Ambulatory Visit: Payer: Self-pay

## 2018-01-29 ENCOUNTER — Emergency Department (HOSPITAL_COMMUNITY): Payer: Medicaid Other

## 2018-01-29 DIAGNOSIS — Z794 Long term (current) use of insulin: Secondary | ICD-10-CM | POA: Diagnosis not present

## 2018-01-29 DIAGNOSIS — E119 Type 2 diabetes mellitus without complications: Secondary | ICD-10-CM | POA: Diagnosis not present

## 2018-01-29 DIAGNOSIS — M542 Cervicalgia: Secondary | ICD-10-CM

## 2018-01-29 DIAGNOSIS — J45909 Unspecified asthma, uncomplicated: Secondary | ICD-10-CM | POA: Diagnosis not present

## 2018-01-29 DIAGNOSIS — I10 Essential (primary) hypertension: Secondary | ICD-10-CM | POA: Insufficient documentation

## 2018-01-29 DIAGNOSIS — R55 Syncope and collapse: Secondary | ICD-10-CM | POA: Insufficient documentation

## 2018-01-29 DIAGNOSIS — Z79899 Other long term (current) drug therapy: Secondary | ICD-10-CM | POA: Diagnosis not present

## 2018-01-29 DIAGNOSIS — J189 Pneumonia, unspecified organism: Secondary | ICD-10-CM | POA: Insufficient documentation

## 2018-01-29 LAB — BASIC METABOLIC PANEL
ANION GAP: 12 (ref 5–15)
BUN: 17 mg/dL (ref 6–20)
CHLORIDE: 100 mmol/L (ref 98–111)
CO2: 30 mmol/L (ref 22–32)
Calcium: 9.3 mg/dL (ref 8.9–10.3)
Creatinine, Ser: 0.59 mg/dL (ref 0.44–1.00)
GFR calc non Af Amer: >60 mL/min (ref >60)
GLUCOSE: 182 mg/dL — AB (ref 70–99)
POTASSIUM: 3.5 mmol/L (ref 3.5–5.1)
SODIUM: 142 mmol/L (ref 135–145)

## 2018-01-29 LAB — CBC WITH DIFFERENTIAL/PLATELET
BASOS ABS: 0 10*3/uL (ref 0.0–0.1)
BASOS PCT: 0 %
EOS ABS: 0.2 10*3/uL (ref 0.0–0.7)
Eosinophils Relative: 2 %
HEMATOCRIT: 39.1 % (ref 36.0–46.0)
HEMOGLOBIN: 11.3 g/dL — AB (ref 12.0–15.0)
Lymphocytes Relative: 17 %
Lymphs Abs: 2.1 10*3/uL (ref 0.7–4.0)
MCH: 25.2 pg — ABNORMAL LOW (ref 26.0–34.0)
MCHC: 28.9 g/dL — ABNORMAL LOW (ref 30.0–36.0)
MCV: 87.3 fL (ref 78.0–100.0)
MONOS PCT: 6 %
Monocytes Absolute: 0.7 10*3/uL (ref 0.1–1.0)
NEUTROS ABS: 9.2 10*3/uL — AB (ref 1.7–7.7)
NEUTROS PCT: 75 %
Platelets: 417 10*3/uL — ABNORMAL HIGH (ref 150–400)
RBC: 4.48 MIL/uL (ref 3.87–5.11)
RDW: 16.8 % — ABNORMAL HIGH (ref 11.5–15.5)
WBC: 12.1 10*3/uL — AB (ref 4.0–10.5)

## 2018-01-29 LAB — I-STAT BETA HCG BLOOD, ED (MC, WL, AP ONLY)

## 2018-01-29 MED ORDER — AMOXICILLIN-POT CLAVULANATE 875-125 MG PO TABS: 1.0000 | ORAL_TABLET | Freq: Two times a day (BID) | ORAL | 0 refills | Status: DC

## 2018-01-29 MED ORDER — PREDNISONE 50 MG PO TABS: 50.0000 mg | ORAL_TABLET | Freq: Every day | ORAL | 0 refills | Status: DC

## 2018-01-29 MED ORDER — IOPAMIDOL (ISOVUE-370) INJECTION 76%
INTRAVENOUS | Status: AC
  Filled 2018-01-29: qty 100

## 2018-01-29 MED ORDER — ONDANSETRON HCL 4 MG/2ML IJ SOLN
4.0000 mg | Freq: Once | INTRAMUSCULAR | Status: AC
  Administered 2018-01-29: 4 mg via INTRAVENOUS
  Filled 2018-01-29: qty 2

## 2018-01-29 MED ORDER — IOPAMIDOL (ISOVUE-370) INJECTION 76%
100.0000 mL | Freq: Once | INTRAVENOUS | Status: AC | PRN
  Administered 2018-01-29: 100 mL via INTRAVENOUS

## 2018-01-29 MED ORDER — HYDROMORPHONE HCL 1 MG/ML IJ SOLN
1.0000 mg | Freq: Once | INTRAMUSCULAR | Status: AC
  Administered 2018-01-29: 1 mg via INTRAVENOUS
  Filled 2018-01-29: qty 1

## 2018-01-29 NOTE — ED Notes (Signed)
Pt provided with ice chips

## 2018-01-29 NOTE — ED Notes (Signed)
Pt returned from CT °

## 2018-01-29 NOTE — ED Notes (Addendum)
Upon going into to start pts IV, pt states " I am a hard stick and they usually give me ativan and use the [place] bc im a hard stick, so I will need the [place]" Matt RN to do US guided IV.

## 2018-01-29 NOTE — ED Notes (Signed)
Patient requesting pain meds.

## 2018-01-29 NOTE — ED Notes (Signed)
Patient transported to X-ray 

## 2018-01-29 NOTE — ED Provider Notes (Addendum)
[facility]-EMERGENCY DEPT Provider Note   CSN: [id number] Arrival date & time: 01/29/18  4627     History   Chief Complaint Chief Complaint  Patient presents with  . Neck Pain    HPI Courtney Zamora is a 32 y.o. female.  The history is provided by the patient. No language interpreter was used.  Neck Pain   This is a new problem. The current episode started 3 to 5 hours ago. The problem occurs constantly. The problem has been gradually improving. The pain is associated with nothing. There has been no fever. The pain is present in the generalized neck. The quality of the pain is described as shooting. The pain is moderate. The pain is the same all the time. She has tried nothing for the symptoms.  Pt reports she turned her head and her neck popped.  Pt reports severe pain in her neck up to her head.  Pt reports she has pain with lifting her arms. Pt reports difficulty lifting her arms.    Past Medical History:  Diagnosis Date  . Anxiety   . Asthma   . Depression   . Essential hypertension   . GERD (gastroesophageal reflux disease)   . Obesity   . Osteoarthritis, knee   . Seizures (HCC)   . Severe needle phobia     Patient Active Problem List   Diagnosis Date Noted  . Abnormal CT scan, esophagus 09/19/2017  . Dysphagia 09/19/2017  . Elevated LFTs 09/19/2017  . Newly diagnosed diabetes (HCC) 09/15/2017  . Hepatic steatosis 09/15/2017  . DM2 (diabetes mellitus, type 2) (HCC) 09/06/2017  . SOB (shortness of breath) 09/06/2017  . CAP (community acquired pneumonia) 08/03/2017  . Acute frontal sinusitis 12/15/2016  . Metatarsal stress fracture, right, sequela 11/17/2016  . Neck muscle spasm 10/05/2016  . Nipple discharge in female 09/13/2016  . Depression 08/17/2016  . Anxiety 08/17/2016  . ASCUS with positive high risk HPV cervical 08/14/2016  . Hypertension 08/14/2016  . Long term (current) use of systemic steroids 08/14/2016  . Esophageal reflux  12/29/2015  . Morbid (severe) obesity due to excess calories (HCC) 12/29/2015  . Chronic pain of right knee 12/29/2015  . Chronic pain of left ankle 12/29/2015  . 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 Asthma, moderate persistent 12/09/2015    Past Surgical History:  Procedure Laterality Date  . TONSILLECTOMY       OB History    Gravida  1   Para      Term      Preterm      AB  1   Living  0     SAB  1   TAB      Ectopic      Multiple      Live Births               Home Medications    Prior to Admission medications   Medication Sig Start Date End Date Taking? Authorizing Provider  acetaminophen-codeine (TYLENOL #3) 300-30 MG tablet TAKE 1 TABLET BY MOUTH EVERY 6 HOURS AS NEEDED FOR MODERATE PAIN 01/11/18  Yes Marcine Matar, MD  albuterol (PROVENTIL HFA;VENTOLIN HFA) 108 (90 Base) MCG/ACT inhaler Inhale 2 puffs into the lungs every 6 (six) hours as needed for wheezing or shortness of breath (wheezing). For shortness of breath. 01/05/18  Yes Marcine Matar, MD  albuterol (PROVENTIL) (2.5 MG/3ML) 0.083% nebulizer solution Take 3 mLs (2.5 mg total) by nebulization every 6 (  six) hours as needed for wheezing or shortness of breath. 11/17/16  Yes Funches, Josalyn, MD  Biotin w/ Vitamins C & E (HAIR/SKIN/NAILS PO) Take 1 tablet by mouth daily.   Yes [provider]  Calcium Citrate 200 MG TABS Take 2 tablets (400 mg total) by mouth every other day. Patient taking differently: Take 200 mg by mouth daily.  10/05/16  Yes Newt Lukes, MD  cetirizine (ZYRTEC) 10 MG tablet Take 1 tablet (10 mg total) by mouth at bedtime. 12/05/17  Yes Marcine Matar, MD  DULoxetine (CYMBALTA) 30 MG capsule Take 1 capsule (30 mg total) by mouth 2 (two) times daily. 08/15/17  Yes Marcine Matar, MD  folic acid (FOLVITE) 1 MG tablet Take 1 tablet (1 mg total) by mouth daily. 12/21/17  Yes Marcine Matar, MD  furosemide (LASIX) 20 MG tablet 1 tab PO every other  day Patient taking differently: Take 20 mg by mouth every other day.  10/20/17  Yes Marcine Matar, MD  gabapentin (NEURONTIN) 300 MG capsule TAKE ONE CAPSULE BY MOUTH IN THE MORNING, ONE CAPSULE AT NOON AND TWO CAPSULES AT BEDTIME. Patient taking differently: Take 300-600 mg by mouth See admin instructions. Take 300 mg by mouth in the morning, take 300 mg by mouth at noon and take 600 mg by mouth at bedtime 10/19/17  Yes Marcine Matar, MD  hydrochlorothiazide (HYDRODIURIL) 25 MG tablet Take 1 tablet (25 mg total) by mouth daily. 10/02/17  Yes Marcine Matar, MD  hydrOXYzine (ATARAX/VISTARIL) 10 MG tablet Take 1 tablet (10 mg total) by mouth 3 (three) times daily as needed for anxiety. 09/08/17  Yes Albertine Grates, MD  insulin aspart (NOVOLOG) 100 UNIT/ML injection Inject 15 Units into the skin 3 (three) times daily before meals. Patient taking differently: Inject 15-17 Units into the skin 3 (three) times daily before meals. Inject 15 units in the am, Inject 17 units at lunch and Inject 15 units in the evening. 10/20/17  Yes Marcine Matar, MD  insulin glargine (LANTUS) 100 UNIT/ML injection Inject 0.52 mLs (52 Units total) into the skin at bedtime. 01/05/18  Yes Marcine Matar, MD  ipratropium-albuterol (DUONEB) 0.5-2.5 (3) MG/3ML SOLN Take 3 mLs by nebulization every 6 (six) hours as needed. Patient taking differently: Take 3 mLs by nebulization every 4 (four) hours as needed (for shortness of breath or wheezing).  07/28/17  Yes Mannam, Praveen, MD  liraglutide (VICTOZA) 18 MG/3ML SOPN Inject 0.2 mLs (1.2 mg total) into the skin daily. 12/05/17  Yes Marcine Matar, MD  methotrexate (RHEUMATREX) 2.5 MG tablet Take 3 tablets (7.5 mg total) by mouth once a week. Caution:Chemotherapy. Protect from light. 12/21/17  Yes Marcine Matar, MD  montelukast (SINGULAIR) 10 MG tablet Take 1 tablet (10 mg total) by mouth daily. 08/09/17 08/09/18 Yes Mannam, Colbert Coyer, MD  Multiple Vitamins-Minerals (WOMENS  MULTI) CAPS Take 1 capsule by mouth daily.   Yes [provider]  pantoprazole (PROTONIX) 40 MG tablet TAKE 1 TABLET BY MOUTH 2 TIMES DAILY BEFORE A MEAL. 01/15/18  Yes Zehr, Princella Pellegrini, PA-C  predniSONE (DELTASONE) 5 MG tablet Take 1 tablet (5 mg total) by mouth daily with breakfast. 01/05/18  Yes Marcine Matar, MD  tiZANidine (ZANAFLEX) 4 MG tablet TAKE 1 TABLET BY MOUTH EVERY 6 HOURS AS NEEDED FOR MUSCLE SPASMS. 11/20/17  Yes Marcine Matar, MD  clindamycin (CLEOCIN) 300 MG capsule Take 1 capsule (300 mg total) by mouth 2 (two) times daily.  Patient not taking: Reported on 01/29/2018 12/06/17   Hoy Register, MD  diclofenac (VOLTAREN) 75 MG EC tablet Take 1 tablet (75 mg total) by mouth 2 (two) times daily as needed for mild pain. Patient taking differently: Take 75 mg by mouth 2 (two) times daily.  10/20/17   Marcine Matar, MD  Insulin Pen Needle (PEN NEEDLES) 31G X 8 MM MISC Use as directed Patient not taking: Reported on 11/16/2017 10/20/17   Marcine Matar, MD  lidocaine (XYLOCAINE) 2 % solution Use as directed 10 mLs in the mouth or throat every 6 (six) hours as needed for mouth pain. Patient not taking: Reported on 01/29/2018 12/06/17   Hoy Register, MD  meclizine (ANTIVERT) 25 MG tablet Take 1 tablet (25 mg total) by mouth 3 (three) times daily as needed for dizziness. Patient not taking: Reported on 01/29/2018 11/25/17   McDonald, Mia A, PA-C  metFORMIN (GLUCOPHAGE-XR) 500 MG 24 hr tablet 2 tabs PO Q a.m and 1 tab Q p.m Patient not taking: Reported on 01/29/2018 10/02/17   Marcine Matar, MD  promethazine (PHENERGAN) 25 MG tablet Take 1 tablet (25 mg total) by mouth every 8 (eight) hours as needed for nausea or vomiting. Patient not taking: Reported on 01/29/2018 12/08/17   Marcine Matar, MD  Respiratory Therapy Supplies (FLUTTER) DEVI As directed. Patient not taking: Reported on 11/16/2017 07/28/17   Chilton Greathouse, MD  Spacer/Aero-Holding Chambers (AEROCHAMBER MV)  inhaler Use as instructed 01/05/18   Marcine Matar, MD  TRUEPLUS LANCETS 28G MISC Check sugar 3-4 times a day before meals Patient not taking: Reported on 11/16/2017 09/28/17   Marcine Matar, MD    Family History Family History  Problem Relation Age of Onset  . Hypothyroidism Mother   . Diabetes Father   . Diabetes Paternal Uncle   . Hypertension Maternal Grandmother   . Hypertension Maternal Grandfather   . Pancreatic cancer Maternal Grandfather   . Hypertension Paternal Grandmother   . Heart disease Paternal Grandfather   . Hypertension Paternal Grandfather     Social History Social History   Tobacco Use  . Smoking status: Never Smoker  . Smokeless tobacco: Never Used  Substance Use Topics  . Alcohol use: No  . Drug use: No     Allergies   Bee venom; Cinnamon; Doxycycline; Ibuprofen; Lexapro [escitalopram oxalate]; and Tape   Review of Systems Review of Systems  Musculoskeletal: Positive for neck pain.  All other systems reviewed and are negative.    Physical Exam Updated Vital Signs BP (!) 151/91   Pulse (!) 112   Temp 98.2 F (36.8 C) (Oral)   Resp (!) 24   Ht 5\' 1"  (1.549 m)   Wt (!) 181.4 kg (400 lb)   SpO2 96%   BMI 75.58 kg/m   Physical Exam  Constitutional: She appears well-developed and well-nourished.  HENT:  Head: Normocephalic.  Right Ear: External ear normal.  Left Ear: External ear normal.  Mouth/Throat: Oropharynx is clear and moist.  Eyes: Pupils are equal, round, and reactive to light. EOM are normal.  Neck:  Tender cervical spine diffusely  Cardiovascular: Normal rate and regular rhythm.  Pulmonary/Chest: Effort normal.  Abdominal: Soft.  Musculoskeletal: Normal range of motion.  Neurological: She is alert.  Skin: Skin is warm.  Psychiatric: She has a normal mood and affect.  Nursing note and vitals reviewed.    ED Treatments / Results  Labs (all labs ordered are listed, but only abnormal results  are  displayed) Labs Reviewed  CBC WITH DIFFERENTIAL/PLATELET - Abnormal; Notable for the following components:      Result Value   WBC 12.1 (*)    Hemoglobin 11.3 (*)    MCH 25.2 (*)    MCHC 28.9 (*)    RDW 16.8 (*)    Platelets 417 (*)    Neutro Abs 9.2 (*)    All other components within normal limits  BASIC METABOLIC PANEL - Abnormal; Notable for the following components:   Glucose, Bld 182 (*)    All other components within normal limits  I-STAT BETA HCG BLOOD, ED ([facility], [facility], [facility] ONLY)    EKG None  Radiology Ct Angio Head W Or Wo Contrast  Result Date: 01/29/2018 CLINICAL DATA:  Neck pain syncope EXAM: CT ANGIOGRAPHY HEAD AND NECK TECHNIQUE: Multidetector CT imaging of the head and neck was performed using the standard protocol during bolus administration of intravenous contrast. Multiplanar CT image reconstructions and MIPs were obtained to evaluate the vascular anatomy. Carotid stenosis measurements (when applicable) are obtained utilizing NASCET criteria, using the distal internal carotid diameter as the denominator. CONTRAST:  ISOVUE-370 IOPAMIDOL (ISOVUE-370) INJECTION 76% COMPARISON:  CT head 11/25/2017 FINDINGS: CT HEAD FINDINGS Brain: No evidence of acute infarction, hemorrhage, hydrocephalus, extra-axial collection or mass lesion/mass effect. Vascular: Negative for hyperdense vessel Skull: Benign-appearing small bony defects in the parietal bone bilaterally compatible with parietal thinning. Sinuses: Negative Orbits: Negative Review of the MIP images confirms the above findings CTA NECK FINDINGS Aortic arch: Image quality degraded by large patient size causing significant artifact. Allowing for this, the aortic arch and proximal great vessels appear normal Right carotid system: Normal right carotid system without atherosclerotic disease or dissection Left carotid system: Normal left carotid system without atherosclerotic disease or dissection. Vertebral arteries: Both vertebral  arteries are normal and widely patent. No evidence of vertebral dissection, which would be difficult to see due to motion. Skeleton: Benign-appearing small bony defects in the parietal bone bilaterally compatible with parietal thinning. Other neck: Negative Upper chest: Right upper lobe infiltrate, possible pneumonia. Review of the MIP images confirms the above findings CTA HEAD FINDINGS Anterior circulation: Cavernous carotid widely patent bilaterally without stenosis. Anterior and middle cerebral arteries normal bilaterally Posterior circulation: Both vertebral arteries patent to the basilar. Basilar widely patent. Posterior cerebral arteries patent bilaterally. Cerebellar arteries difficult to evaluate due to patient size Venous sinuses: Negative Anatomic variants: None Delayed phase: Normal enhancement. No acute intracranial abnormality. Review of the MIP images confirms the above findings IMPRESSION: 1. Negative CTA head and neck. Negative for dissection or stenosis. No emergent large vessel occlusion 2. Image quality degraded by obesity. 3. Right upper lobe infiltrate, possible pneumonia. Electronically Signed   By: Marlan Palau M.D.   On: 01/29/2018 13:30   Ct Angio Neck W And/or Wo Contrast  Result Date: 01/29/2018 CLINICAL DATA:  Neck pain syncope EXAM: CT ANGIOGRAPHY HEAD AND NECK TECHNIQUE: Multidetector CT imaging of the head and neck was performed using the standard protocol during bolus administration of intravenous contrast. Multiplanar CT image reconstructions and MIPs were obtained to evaluate the vascular anatomy. Carotid stenosis measurements (when applicable) are obtained utilizing NASCET criteria, using the distal internal carotid diameter as the denominator. CONTRAST:  ISOVUE-370 IOPAMIDOL (ISOVUE-370) INJECTION 76% COMPARISON:  CT head 11/25/2017 FINDINGS: CT HEAD FINDINGS Brain: No evidence of acute infarction, hemorrhage, hydrocephalus, extra-axial collection or mass lesion/mass  effect. Vascular: Negative for hyperdense vessel Skull: Benign-appearing small bony defects in the  parietal bone bilaterally compatible with parietal thinning. Sinuses: Negative Orbits: Negative Review of the MIP images confirms the above findings CTA NECK FINDINGS Aortic arch: Image quality degraded by large patient size causing significant artifact. Allowing for this, the aortic arch and proximal great vessels appear normal Right carotid system: Normal right carotid system without atherosclerotic disease or dissection Left carotid system: Normal left carotid system without atherosclerotic disease or dissection. Vertebral arteries: Both vertebral arteries are normal and widely patent. No evidence of vertebral dissection, which would be difficult to see due to motion. Skeleton: Benign-appearing small bony defects in the parietal bone bilaterally compatible with parietal thinning. Other neck: Negative Upper chest: Right upper lobe infiltrate, possible pneumonia. Review of the MIP images confirms the above findings CTA HEAD FINDINGS Anterior circulation: Cavernous carotid widely patent bilaterally without stenosis. Anterior and middle cerebral arteries normal bilaterally Posterior circulation: Both vertebral arteries patent to the basilar. Basilar widely patent. Posterior cerebral arteries patent bilaterally. Cerebellar arteries difficult to evaluate due to patient size Venous sinuses: Negative Anatomic variants: None Delayed phase: Normal enhancement. No acute intracranial abnormality. Review of the MIP images confirms the above findings IMPRESSION: 1. Negative CTA head and neck. Negative for dissection or stenosis. No emergent large vessel occlusion 2. Image quality degraded by obesity. 3. Right upper lobe infiltrate, possible pneumonia. Electronically Signed   By: Marlan Palau M.D.   On: 01/29/2018 13:30    Procedures Procedures (including critical care time)  Medications Ordered in ED Medications   iopamidol (ISOVUE-370) 76 % injection (has no administration in time range)  HYDROmorphone (DILAUDID) injection 1 mg (1 mg Intravenous Given 01/29/18 1155)  ondansetron (ZOFRAN) injection 4 mg (4 mg Intravenous Given 01/29/18 1155)  iopamidol (ISOVUE-370) 76 % injection 100 mL (100 mLs Intravenous Contrast Given 01/29/18 1250)     Initial Impression / Assessment and Plan / ED Course  I have reviewed the triage vital signs and the nursing notes.  Pertinent labs & imaging results that were available during my care of the patient were reviewed by me and considered in my medical decision making (see chart for details).     MDM  Ct angio head and neck no acute vascular issue.  Ct c spine shows possible right upper lobe infiltrate.  Pt reports she has had a cough.  No fever,  02 sats 96 .  Pt moving arms and using phone.  Radiologist reports chest xray poor quality due to weight.  I will treat pt with Augmentin (pt is allergic to doxycycline and has a histroy of prolonged QT.   Pt request increase in her prednisone.  Pt has RA.  Pt given  Prednisone 50 x 3 days,  Pt advised to go back to her normal dosage on 4th day.    Final Clinical Impressions(s) / ED Diagnoses   Final diagnoses:  Community acquired pneumonia of right lung, unspecified part of lung  Neck pain    ED Discharge Orders        Ordered    amoxicillin-clavulanate (AUGMENTIN) 875-125 MG tablet  Every 12 hours     01/29/18 1517    An After Visit Summary was printed and given to the patient.    Elson Areas, PA-C 01/29/18 1518    Elson Areas, PA-C 01/29/18 1533    Lorre Nick, MD 01/30/18 [phone]

## 2018-01-29 NOTE — ED Triage Notes (Signed)
Pt comes from home. Pt is AOx4 and and can only stand and pivot at minimum. Pt chief complaint is middle neck pain that began at 0600 this morning. Pt stated she "Blacked out" and lost mobility and was able to get in touch with husband when pt came to which was around 0820 this morning who them brought pt to ED.

## 2018-01-29 NOTE — ED Notes (Signed)
Pt requesting pain meds

## 2018-01-29 NOTE — ED Notes (Signed)
PA at bedside.

## 2018-01-29 NOTE — ED Notes (Signed)
Pt and family updated on plan of care  

## 2018-01-29 NOTE — Discharge Instructions (Addendum)
See your Physician for recheck in 2-3 days  

## 2018-01-29 NOTE — ED Notes (Addendum)
Upon going into room pt states " I cannot believe that yall have not but a C-collar on me yet. I just don't know why you havent done it yet" Pt informed that it is not standard protocol to put c-collar on pts unless there is trauma or an order from MD. Pt denies trauma. Pt reports that she "popped" her neck this morning and then had pain. Pt reports that she then blacked out and must have fell back asleep. When she woke she still had pain.

## 2018-01-30 ENCOUNTER — Ambulatory Visit: Payer: Self-pay | Admitting: Internal Medicine

## 2018-02-09 ENCOUNTER — Ambulatory Visit: Payer: Medicaid Other | Admitting: Cardiology

## 2018-02-12 ENCOUNTER — Ambulatory Visit: Payer: Medicaid Other | Attending: Internal Medicine | Admitting: Internal Medicine

## 2018-02-12 ENCOUNTER — Encounter: Payer: Self-pay | Admitting: Internal Medicine

## 2018-02-12 ENCOUNTER — Encounter

## 2018-02-12 VITALS — BP 110/64 | HR 115 | Temp 98.6°F | Resp 16 | Wt 391.0 lb

## 2018-02-12 DIAGNOSIS — M0579 Rheumatoid arthritis with rheumatoid factor of multiple sites without organ or systems involvement: Secondary | ICD-10-CM | POA: Diagnosis not present

## 2018-02-12 DIAGNOSIS — K219 Gastro-esophageal reflux disease without esophagitis: Secondary | ICD-10-CM | POA: Diagnosis not present

## 2018-02-12 DIAGNOSIS — R197 Diarrhea, unspecified: Secondary | ICD-10-CM | POA: Diagnosis not present

## 2018-02-12 DIAGNOSIS — I10 Essential (primary) hypertension: Secondary | ICD-10-CM | POA: Insufficient documentation

## 2018-02-12 DIAGNOSIS — F419 Anxiety disorder, unspecified: Secondary | ICD-10-CM | POA: Diagnosis not present

## 2018-02-12 DIAGNOSIS — Z7952 Long term (current) use of systemic steroids: Secondary | ICD-10-CM | POA: Diagnosis not present

## 2018-02-12 DIAGNOSIS — G8929 Other chronic pain: Secondary | ICD-10-CM | POA: Diagnosis not present

## 2018-02-12 DIAGNOSIS — R9389 Abnormal findings on diagnostic imaging of other specified body structures: Secondary | ICD-10-CM

## 2018-02-12 DIAGNOSIS — R1310 Dysphagia, unspecified: Secondary | ICD-10-CM | POA: Insufficient documentation

## 2018-02-12 DIAGNOSIS — Z881 Allergy status to other antibiotic agents status: Secondary | ICD-10-CM | POA: Insufficient documentation

## 2018-02-12 DIAGNOSIS — J4540 Moderate persistent asthma, uncomplicated: Secondary | ICD-10-CM | POA: Diagnosis not present

## 2018-02-12 DIAGNOSIS — R7989 Other specified abnormal findings of blood chemistry: Secondary | ICD-10-CM | POA: Diagnosis not present

## 2018-02-12 DIAGNOSIS — Z79899 Other long term (current) drug therapy: Secondary | ICD-10-CM | POA: Insufficient documentation

## 2018-02-12 DIAGNOSIS — E1165 Type 2 diabetes mellitus with hyperglycemia: Secondary | ICD-10-CM | POA: Diagnosis present

## 2018-02-12 DIAGNOSIS — M1712 Unilateral primary osteoarthritis, left knee: Secondary | ICD-10-CM

## 2018-02-12 DIAGNOSIS — N6452 Nipple discharge: Secondary | ICD-10-CM | POA: Diagnosis not present

## 2018-02-12 DIAGNOSIS — Z794 Long term (current) use of insulin: Secondary | ICD-10-CM | POA: Insufficient documentation

## 2018-02-12 DIAGNOSIS — D649 Anemia, unspecified: Secondary | ICD-10-CM | POA: Insufficient documentation

## 2018-02-12 DIAGNOSIS — R000 Tachycardia, unspecified: Secondary | ICD-10-CM | POA: Diagnosis not present

## 2018-02-12 DIAGNOSIS — IMO0001 Reserved for inherently not codable concepts without codable children: Secondary | ICD-10-CM

## 2018-02-12 DIAGNOSIS — Z886 Allergy status to analgesic agent status: Secondary | ICD-10-CM | POA: Diagnosis not present

## 2018-02-12 DIAGNOSIS — M25551 Pain in right hip: Secondary | ICD-10-CM

## 2018-02-12 DIAGNOSIS — M25552 Pain in left hip: Secondary | ICD-10-CM

## 2018-02-12 DIAGNOSIS — F329 Major depressive disorder, single episode, unspecified: Secondary | ICD-10-CM | POA: Diagnosis not present

## 2018-02-12 LAB — GLUCOSE, POCT (MANUAL RESULT ENTRY): POC Glucose: 198 mg/dl — AB (ref 70–99)

## 2018-02-12 MED ORDER — INSULIN ASPART 100 UNIT/ML ~~LOC~~ SOLN: SUBCUTANEOUS | 5 refills | Status: DC

## 2018-02-12 MED ORDER — METHOTREXATE 2.5 MG PO TABS: 7.5000 mg | ORAL_TABLET | ORAL | 1 refills | Status: DC

## 2018-02-12 MED ORDER — ATENOLOL 25 MG PO TABS: 12.5000 mg | ORAL_TABLET | Freq: Every day | ORAL | 3 refills | Status: AC

## 2018-02-12 MED ORDER — TIZANIDINE HCL 4 MG PO TABS: 4.0000 mg | ORAL_TABLET | Freq: Four times a day (QID) | ORAL | 2 refills | Status: DC | PRN

## 2018-02-12 NOTE — Progress Notes (Signed)
Patient ID: Courtney Zamora, female    DOB: [birth year]  MRN: [id number]  CC: Medication Management and Hospitalization Follow-up (ED f/u)   Subjective: Courtney Zamora is a 32 y.o. female who presents for chronic ds management Her concerns today include:  Patient with history of mod persistent asthma, morbid obesity, HTN, DM type 2, RA, depression/anxiety, chronic knee pain due to meniscus tearand osteoarthritis based on MRI done 04/2016, on pain management agreement for Tylenol #3.    RA/OA: doing good on MTX, helps a little with decrease jt pain.  Developed significant diarrhea about 1-2 wks after being on MTX.  Diarrhea stopped after we discontinued Voltaren and metformin.  She was told to hold off on restarting them until I saw her today in follow-up.   Has appt for next mth to establish with rheumatology. -New complaint is pain in hips x 3-4 wks.  Worse when lifting the legs like to get in bed or in the car.  Not much pain with walking or sitting. -Seen in the ER earlier this month with neck pain.  CTA of head and neck was negative for dissection or stenosis.  Incidental finding of right upper lobe infiltrate possible pneumonia.  Chest x-ray was then done that revealed bilateral pulmonary opacities that appear to represent vascular congestion rather than consolidation but image quality was reported as limited due to body habitus.  Patient was discharged on increased prednisone for 3 days along with Augmentin antibiotics.  She has completed the antibiotics.   - Asthma: In discussing the incidental finding of right upper lobe infiltrate on CTA, patient denies any fever.  Does have nonproductive cough.  She feels that she is doing okay in regards to her asthma.  Currently on Singulair, albuterol and DuoNeb's.  She has used a nebulizer and inhaler about once a week.  DM/obesity:  BS in 180-200s since Metformin d/c. Checking several times a day. Taking Lantus 60 units daily, Novolog 15 TID,  Victozia Victozia has dec appetite.  Has loss 9 lbs this month   Tachycardia: Has appointment with cardiology in September.  She can sometimes feel when her heart rate is up.  TSH has been normal.  Echo done in January of this year revealed normal systolic function with no regional wall motion abnormalities, normal valve function and normal pulmonary artery pressure.  Patient Active Problem List   Diagnosis Date Noted  . Abnormal CT scan, esophagus 09/19/2017  . Dysphagia 09/19/2017  . Elevated LFTs 09/19/2017  . Newly diagnosed diabetes (HCC) 09/15/2017  . Hepatic steatosis 09/15/2017  . DM2 (diabetes mellitus, type 2) (HCC) 09/06/2017  . SOB (shortness of breath) 09/06/2017  . CAP (community acquired pneumonia) 08/03/2017  . Acute frontal sinusitis 12/15/2016  . Metatarsal stress fracture, right, sequela 11/17/2016  . Neck muscle spasm 10/05/2016  . Nipple discharge in female 09/13/2016  . Depression 08/17/2016  . Anxiety 08/17/2016  . ASCUS with positive high risk HPV cervical 08/14/2016  . Hypertension 08/14/2016  . Long term (current) use of systemic steroids 08/14/2016  . Esophageal reflux 12/29/2015  . Morbid (severe) obesity due to excess calories (HCC) 12/29/2015  . Chronic pain of right knee 12/29/2015  . Chronic pain of left ankle 12/29/2015  . 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 Asthma, moderate persistent 12/09/2015     Current Outpatient Medications on File Prior to Visit  Medication Sig Dispense Refill  . acetaminophen-codeine (TYLENOL #3) 300-30 MG tablet TAKE 1 TABLET BY MOUTH EVERY 6 HOURS AS  NEEDED FOR MODERATE PAIN 120 tablet 0  . albuterol (PROVENTIL HFA;VENTOLIN HFA) 108 (90 Base) MCG/ACT inhaler Inhale 2 puffs into the lungs every 6 (six) hours as needed for wheezing or shortness of breath (wheezing). For shortness of breath. 1 Inhaler 11  . albuterol (PROVENTIL) (2.5 MG/3ML) 0.083% nebulizer solution Take 3 mLs (2.5 mg total) by nebulization every 6 (six)  hours as needed for wheezing or shortness of breath. 150 mL 1  . amoxicillin-clavulanate (AUGMENTIN) 875-125 MG tablet Take 1 tablet by mouth every 12 (twelve) hours. 14 tablet 0  . Biotin w/ Vitamins C & E (HAIR/SKIN/NAILS PO) Take 1 tablet by mouth daily.    . Calcium Citrate 200 MG TABS Take 2 tablets (400 mg total) by mouth every other day. (Patient taking differently: Take 200 mg by mouth daily. )    . cetirizine (ZYRTEC) 10 MG tablet Take 1 tablet (10 mg total) by mouth at bedtime. 30 tablet 2  . diclofenac (VOLTAREN) 75 MG EC tablet Take 1 tablet (75 mg total) by mouth 2 (two) times daily as needed for mild pain. (Patient taking differently: Take 75 mg by mouth 2 (two) times daily. ) 60 tablet 2  . DULoxetine (CYMBALTA) 30 MG capsule Take 1 capsule (30 mg total) by mouth 2 (two) times daily. 60 capsule 6  . folic acid (FOLVITE) 1 MG tablet Take 1 tablet (1 mg total) by mouth daily. 30 tablet 1  . furosemide (LASIX) 20 MG tablet 1 tab PO every other day (Patient taking differently: Take 20 mg by mouth every other day. ) 30 tablet 2  . gabapentin (NEURONTIN) 300 MG capsule TAKE ONE CAPSULE BY MOUTH IN THE MORNING, ONE CAPSULE AT NOON AND TWO CAPSULES AT BEDTIME. (Patient taking differently: Take 300-600 mg by mouth See admin instructions. Take 300 mg by mouth in the morning, take 300 mg by mouth at noon and take 600 mg by mouth at bedtime) 120 capsule 4  . hydrochlorothiazide (HYDRODIURIL) 25 MG tablet Take 1 tablet (25 mg total) by mouth daily. 90 tablet 3  . hydrOXYzine (ATARAX/VISTARIL) 10 MG tablet Take 1 tablet (10 mg total) by mouth 3 (three) times daily as needed for anxiety. 30 tablet 0  . insulin aspart (NOVOLOG) 100 UNIT/ML injection Inject 15 Units into the skin 3 (three) times daily before meals. (Patient taking differently: Inject 15-17 Units into the skin 3 (three) times daily before meals. Inject 15 units in the am, Inject 17 units at lunch and Inject 15 units in the evening.) 10 mL  11  . insulin glargine (LANTUS) 100 UNIT/ML injection Inject 0.52 mLs (52 Units total) into the skin at bedtime. 20 mL 3  . Insulin Pen Needle (PEN NEEDLES) 31G X 8 MM MISC Use as directed (Patient not taking: Reported on 11/16/2017) 100 each 3  . ipratropium-albuterol (DUONEB) 0.5-2.5 (3) MG/3ML SOLN Take 3 mLs by nebulization every 6 (six) hours as needed. (Patient taking differently: Take 3 mLs by nebulization every 4 (four) hours as needed (for shortness of breath or wheezing). ) 360 mL 5  . liraglutide (VICTOZA) 18 MG/3ML SOPN Inject 0.2 mLs (1.2 mg total) into the skin daily. 3 mL 6  . methotrexate (RHEUMATREX) 2.5 MG tablet Take 3 tablets (7.5 mg total) by mouth once a week. Caution:Chemotherapy. Protect from light. 12 tablet 1  . montelukast (SINGULAIR) 10 MG tablet Take 1 tablet (10 mg total) by mouth daily. 30 tablet 5  . Multiple Vitamins-Minerals (WOMENS MULTI) CAPS Take 1  capsule by mouth daily.    . pantoprazole (PROTONIX) 40 MG tablet TAKE 1 TABLET BY MOUTH 2 TIMES DAILY BEFORE A MEAL. 60 tablet 3  . predniSONE (DELTASONE) 50 MG tablet Take 1 tablet (50 mg total) by mouth daily. 3 tablet 0  . promethazine (PHENERGAN) 25 MG tablet Take 1 tablet (25 mg total) by mouth every 8 (eight) hours as needed for nausea or vomiting. (Patient not taking: Reported on 01/29/2018) 30 tablet 2  . Respiratory Therapy Supplies (FLUTTER) DEVI As directed. (Patient not taking: Reported on 11/16/2017) 1 each 0  . Spacer/Aero-Holding Chambers (AEROCHAMBER MV) inhaler Use as instructed 1 each 0  . tiZANidine (ZANAFLEX) 4 MG tablet TAKE 1 TABLET BY MOUTH EVERY 6 HOURS AS NEEDED FOR MUSCLE SPASMS. 60 tablet 2  . TRUEPLUS LANCETS 28G MISC Check sugar 3-4 times a day before meals (Patient not taking: Reported on 11/16/2017) 100 each 12   No current facility-administered medications on file prior to visit.     Allergies  Allergen Reactions  . Bee Venom Anaphylaxis  . Cinnamon Anaphylaxis  . Doxycycline Hives  .  Ibuprofen Other (See Comments)    Abdominal pain  . Lexapro [Escitalopram Oxalate] Other (See Comments)    Generalized body shaking. ? sz  . Tape Hives, Rash and Other (See Comments)    EKG STICKERS    Social History   Socioeconomic History  . Marital status: Married    Spouse name: Not on file  . Number of children: Not on file  . Years of education: Not on file  . Highest education level: Not on file  Occupational History  . Not on file  Social Needs  . Financial resource strain: Not on file  . Food insecurity:    Worry: Not on file    Inability: Not on file  . Transportation needs:    Medical: Not on file    Non-medical: Not on file  Tobacco Use  . Smoking status: Never Smoker  . Smokeless tobacco: Never Used  Substance and Sexual Activity  . Alcohol use: No  . Drug use: No  . Sexual activity: Yes    Birth control/protection: None  Lifestyle  . Physical activity:    Days per week: Not on file    Minutes per session: Not on file  . Stress: Not on file  Relationships  . Social connections:    Talks on phone: Not on file    Gets together: Not on file    Attends religious service: Not on file    Active member of club or organization: Not on file    Attends meetings of clubs or organizations: Not on file    Relationship status: Not on file  . Intimate partner violence:    Fear of current or ex partner: Not on file    Emotionally abused: Not on file    Physically abused: Not on file    Forced sexual activity: Not on file  Other Topics Concern  . Not on file  Social History Narrative  . Not on file    Family History  Problem Relation Age of Onset  . Hypothyroidism Mother   . Diabetes Father   . Diabetes Paternal Uncle   . Hypertension Maternal Grandmother   . Hypertension Maternal Grandfather   . Pancreatic cancer Maternal Grandfather   . Hypertension Paternal Grandmother   . Heart disease Paternal Grandfather   . Hypertension Paternal Grandfather      Past Surgical History:  Procedure Laterality Date  . TONSILLECTOMY      ROS: Review of Systems Negative except as stated above PHYSICAL EXAM: BP 110/64   Pulse (!) 115   Temp 98.6 F (37 C) (Oral)   Resp 16   Wt (!) 391 lb (177.4 kg)   SpO2 95%   BMI 73.88 kg/m   Wt Readings from Last 3 Encounters:  02/12/18 (!) 391 lb (177.4 kg)  01/29/18 (!) 400 lb (181.4 kg)  12/21/17 (!) 407 lb 9.6 oz (184.9 kg)    Physical Exam  General appearance - alert, well appearing, morbidly obese FM and in no distress Mental status - normal mood, behavior, speech, dress, motor activity, and thought processes Neck - supple, no significant adenopathy Chest - clear to auscultation, no wheezes, rales or rhonchi, symmetric air entry Heart - Slight tachy, regular. no murmurs, rubs, clicks or gallops Musculoskeletal - ambulates unassisted. Looks like she has had dec edema/inflammation of wrists and MCPs.  Pt unable to get on exam table to allow full eval of hip jts. Extremities - no LE edema  Results for orders placed or performed in visit on 02/12/18  POCT glucose (manual entry)  Result Value Ref Range   POC Glucose 198 (A) 70 - 99 mg/dl   Lab Results  Component Value Date   WBC 12.1 (H) 01/29/2018   HGB 11.3 (L) 01/29/2018   HCT 39.1 01/29/2018   MCV 87.3 01/29/2018   PLT 417 (H) 01/29/2018     Chemistry      Component Value Date/Time   NA 142 01/29/2018 1124   NA 141 10/20/2017 1123   K 3.5 01/29/2018 1124   CL 100 01/29/2018 1124   CO2 30 01/29/2018 1124   BUN 17 01/29/2018 1124   BUN 23 (H) 10/20/2017 1123   CREATININE 0.59 01/29/2018 1124      Component Value Date/Time   CALCIUM 9.3 01/29/2018 1124   ALKPHOS 58 11/25/2017 1332   AST 30 11/25/2017 1332   ALT 56 (H) 11/25/2017 1332   BILITOT 0.6 11/25/2017 1332        ASSESSMENT AND PLAN: 1. Diabetes mellitus type 2, uncontrolled, without complications (HCC) -commended on wgh loss. Restart Metformin at 500 mg BID.  Let me know if diarrhea recurs - POCT glucose (manual entry) - insulin aspart (NOVOLOG) 100 UNIT/ML injection; Inject 15 units in a.m with BF, 17 unis with lunch and 15 unis Subcut with dinner  Dispense: 20 mL; Refill: 5  2. Rheumatoid arthritis involving multiple sites with positive rheumatoid factor (HCC) -has up coming appt with Rheumatology 02/27/2018.   Continue Prednisone 5 mg and MTX Hold off on oral Voltaren. Diarrhea that she experience may have been due to interaction b/w MTX and Voltaren vs due to inc dose Metformin vs viral gastroenteritis. Last CMP 11/2017. Hep screen neg earlier this yr.  Mild anemia that is improved from 2 mths ago - methotrexate (RHEUMATREX) 2.5 MG tablet; Take 3 tablets (7.5 mg total) by mouth once a week. Caution:Chemotherapy. Protect from light.  Dispense: 12 tablet; Refill: 1  3. Morbid obesity (HCC) See #1 above  4. Tachycardia, unspecified -will try her with low dose Atenolol. Pt advised that med can affect asthma and to let me know if any increase SOB occurs Cardiology appt scheduled for Sept - atenolol (TENORMIN) 25 MG tablet; Take 0.5 tablets (12.5 mg total) by mouth daily.  Dispense: 30 tablet; Refill: 3  5. Moderate persistent asthma, unspecified whether complicated 6. Abnormal CXR -  Ambulatory referral to Pulmonology - Ambulatory referral to Pulmonology  7. Osteoarthritis of left knee, unspecified osteoarthritis type 8. Hip pain, bilateral -will x-ray both hips to eval further DDX OA, osteonecrosis, RA - DG HIP UNILAT WITH PELVIS 2-3 VIEWS RIGHT; Future -RF Tyl#3.  [facility] reviewed and is appropriate. 2 rxns given. One to fill now and other on or after Sept 1st.  Patient was given the opportunity to ask questions.  Patient verbalized understanding of the plan and was able to repeat key elements of the plan.   Orders Placed This Encounter  Procedures  . POCT glucose (manual entry)     Requested Prescriptions    No prescriptions  requested or ordered in this encounter    No follow-ups on file.  Jonah Blue, MD, FACP

## 2018-02-12 NOTE — Progress Notes (Signed)
Pt states she has not been able to sleep

## 2018-02-12 NOTE — Patient Instructions (Signed)
Restart Metformin at 500 mg one tablet twice a day.  Start low dose Atenolol 25 mg 1/2 tablet daily.  Go o get x-rays of the hips.

## 2018-02-13 ENCOUNTER — Telehealth: Payer: Self-pay

## 2018-02-13 DIAGNOSIS — E1165 Type 2 diabetes mellitus with hyperglycemia: Principal | ICD-10-CM

## 2018-02-13 DIAGNOSIS — IMO0001 Reserved for inherently not codable concepts without codable children: Secondary | ICD-10-CM

## 2018-02-13 NOTE — Telephone Encounter (Signed)
Called pt to inform her that the PA on her tylenol #3 was approved, pt also asked for a refill on her 'anxiety med' she wasn't sure of the name but said she uses it prn, when I asked if hydroxyzine might be it she said that it sounded right.   The patient also inquired about a CBG monitor and possibly an insulin pump, I told her that we may be able to help in getting a CBG monitor if it was deemed appropriate by her provider but the insulin pump may require a referral to a specialist. Pt was open to the idea of seeing one if that's what it takes, she expressed that she is very tired of the constant daily injections and finger-sticks and wants to be able to eliminate some of those. She reports that she has been doing some research on both pumps and CBG monitors and she spoke with a representative at [facility] to see if it was covered by Medicaid she was told it is typically covered for pt's under 20y/o and older pt's with doctors note. I am not sure if that is the case. I told her we would see what the next step should be and someone would be in contact with her in the next few days.  Pt also informed me she is out of refills on her tylenol #3.

## 2018-02-14 ENCOUNTER — Encounter: Payer: Self-pay | Admitting: Internal Medicine

## 2018-02-14 DIAGNOSIS — M0579 Rheumatoid arthritis with rheumatoid factor of multiple sites without organ or systems involvement: Secondary | ICD-10-CM

## 2018-02-14 MED ORDER — HYDROXYZINE HCL 10 MG PO TABS: 10.0000 mg | ORAL_TABLET | Freq: Three times a day (TID) | ORAL | 3 refills | Status: DC | PRN

## 2018-02-14 NOTE — Telephone Encounter (Signed)
Contacted pt and made aware that tylenol #4 will be ready tomorrow and the referral for endo has been placed and they will contact her to schedule an appointment

## 2018-02-14 NOTE — Telephone Encounter (Signed)
Left vm for pt to notify that refills were approved and that Dr. Laural Benes started referral to endocrinology for her in regards to CBG monitor and insulin pump and that she would be contacted when the referral is complete. Was told to call front desk @ [phone] for referral status or myself @ [phone] for questions about what we had discussed.

## 2018-02-15 ENCOUNTER — Other Ambulatory Visit: Payer: Self-pay

## 2018-02-15 DIAGNOSIS — M0579 Rheumatoid arthritis with rheumatoid factor of multiple sites without organ or systems involvement: Secondary | ICD-10-CM

## 2018-02-15 MED ORDER — MONTELUKAST SODIUM 10 MG PO TABS: 10.0000 mg | ORAL_TABLET | Freq: Every day | ORAL | 5 refills | Status: DC

## 2018-02-15 MED ORDER — ACETAMINOPHEN-CODEINE #3 300-30 MG PO TABS: ORAL_TABLET | ORAL | 0 refills | Status: DC

## 2018-02-15 MED ORDER — FOLIC ACID 1 MG PO TABS: 1.0000 mg | ORAL_TABLET | Freq: Every day | ORAL | 6 refills | Status: DC

## 2018-02-15 MED ORDER — ACETAMINOPHEN-CODEINE #3 300-30 MG PO TABS: 1.0000 | ORAL_TABLET | Freq: Four times a day (QID) | ORAL | 0 refills | Status: DC | PRN

## 2018-02-22 ENCOUNTER — Telehealth: Payer: Self-pay | Admitting: Internal Medicine

## 2018-02-22 NOTE — Telephone Encounter (Signed)
Will forward to pcp

## 2018-02-22 NOTE — Telephone Encounter (Signed)
Patient called stating that she was prescribed metformin and it is messing with her stomach. Patient would like to know if their is another medication she can take. Please f/u.

## 2018-02-23 ENCOUNTER — Encounter: Payer: Self-pay | Admitting: Internal Medicine

## 2018-02-23 MED ORDER — SITAGLIPTIN PHOSPHATE 50 MG PO TABS: 50.0000 mg | ORAL_TABLET | Freq: Every day | ORAL | 6 refills | Status: DC

## 2018-02-23 NOTE — Telephone Encounter (Signed)
Contacted pt to go over Dr. Laural Benes response pt didn't answer left a detailed vm informing pt of the information and if she has any questions or concerns to give me a call

## 2018-02-27 DIAGNOSIS — M0579 Rheumatoid arthritis with rheumatoid factor of multiple sites without organ or systems involvement: Secondary | ICD-10-CM | POA: Diagnosis present

## 2018-03-01 ENCOUNTER — Other Ambulatory Visit: Payer: Self-pay | Admitting: Internal Medicine

## 2018-03-02 ENCOUNTER — Ambulatory Visit (INDEPENDENT_AMBULATORY_CARE_PROVIDER_SITE_OTHER): Payer: Medicaid Other | Admitting: Obstetrics and Gynecology

## 2018-03-02 ENCOUNTER — Telehealth: Payer: Self-pay

## 2018-03-02 ENCOUNTER — Other Ambulatory Visit (HOSPITAL_COMMUNITY)
Admission: RE | Admit: 2018-03-02 | Discharge: 2018-03-02 | Disposition: A | Discharge status: Home / Self Care | Payer: Medicaid Other | Source: Ambulatory Visit | Attending: Obstetrics and Gynecology | Admitting: Obstetrics and Gynecology

## 2018-03-02 ENCOUNTER — Encounter: Payer: Self-pay | Admitting: Obstetrics and Gynecology

## 2018-03-02 DIAGNOSIS — R87610 Atypical squamous cells of undetermined significance on cytologic smear of cervix (ASC-US): Secondary | ICD-10-CM | POA: Diagnosis not present

## 2018-03-02 DIAGNOSIS — R87619 Unspecified abnormal cytological findings in specimens from cervix uteri: Secondary | ICD-10-CM | POA: Insufficient documentation

## 2018-03-02 DIAGNOSIS — N938 Other specified abnormal uterine and vaginal bleeding: Secondary | ICD-10-CM | POA: Diagnosis present

## 2018-03-02 NOTE — Patient Instructions (Signed)
Diet for Polycystic Ovarian Syndrome Polycystic ovary syndrome (PCOS) is a disorder of the chemical messengers (hormones) that regulate menstruation. The condition causes important hormones to be out of balance. PCOS can:  Make your periods irregular or stop.  Cause cysts to develop on the ovaries.  Make it difficult to get pregnant.  Stop your body from responding to the effects of insulin (insulin resistance), which can lead to obesity and diabetes.  Changing what you eat can help manage PCOS and improve your health. It can help you lose weight and improve the way your body uses insulin. What is my plan?  Eat breakfast, lunch, and dinner plus two snacks every day.  Include protein in each meal and snack.  Choose whole grains instead of products made with refined flour.  Eat a variety of foods.  Exercise regularly as told by your health care provider. What do I need to know about this eating plan? If you are overweight or obese, pay attention to how many calories you eat. Cutting down on calories can help you lose weight. Work with your health care provider or dietitian to figure out how many calories you need each day. What foods can I eat? Grains Whole grains, such as whole wheat. Whole-grain breads, crackers, cereals, and pasta. Unsweetened oatmeal, bulgur, barley, quinoa, or brown rice. Corn or whole-wheat flour tortillas. Vegetables  Lettuce. Spinach. Peas. Beets. Cauliflower. Cabbage. Broccoli. Carrots. Tomatoes. Squash. Eggplant. Herbs. Peppers. Onions. Cucumbers. Brussels sprouts. Fruits Berries. Bananas. Apples. Oranges. Grapes. Papaya. Mango. Pomegranate. Kiwi. Grapefruit. Cherries. Meats and Other Protein Sources Lean proteins, such as fish, chicken, beans, eggs, and tofu. Dairy Low-fat dairy products, such as skim milk, cheese sticks, and yogurt. Beverages Low-fat or fat-free drinks, such as water, low-fat milk, sugar-free drinks, and 100% fruit  juice. Condiments Ketchup. Mustard. Barbecue sauce. Relish. Low-fat or fat-free mayonnaise. Fats and Oils Olive oil or canola oil. Walnuts and almonds. The items listed above may not be a complete list of recommended foods or beverages. Contact your dietitian for more options. What foods are not recommended? Foods high in calories or fat. Fried foods. Sweets. Products made from refined white flour, including white bread, pastries, white rice, and pasta. The items listed above may not be a complete list of foods and beverages to avoid. Contact your dietitian for more information. This information is not intended to replace advice given to you by your health care provider. Make sure you discuss any questions you have with your health care provider. Document Released: 10/26/2015 Document Revised: 12/10/2015 Document Reviewed: 07/16/2014 Elsevier Interactive Patient Education  2018 Elsevier Inc. Polycystic Ovarian Syndrome Polycystic ovarian syndrome (PCOS) is a common hormonal disorder among women of reproductive age. In most women with PCOS, many small fluid-filled sacs (cysts) grow on the ovaries, and the cysts are not part of a normal menstrual cycle. PCOS can cause problems with your menstrual periods and make it difficult to get pregnant. It can also cause an increased risk of miscarriage with pregnancy. If it is not treated, PCOS can lead to serious health problems, such as diabetes and heart disease. What are the causes? The cause of PCOS is not known, but it may be the result of a combination of certain factors, such as:  Irregular menstrual cycle.  High levels of certain hormones (androgens).  Problems with the hormone that helps to control blood sugar (insulin resistance).  Certain genes.  What increases the risk? This condition is more likely to develop in women who have   a family history of PCOS. What are the signs or symptoms? Symptoms of PCOS may include:  Multiple ovarian  cysts.  Infrequent periods or no periods.  Periods that are too frequent or too heavy.  Unpredictable periods.  Inability to get pregnant (infertility) because of not ovulating.  Increased growth of hair on the face, chest, stomach, back, thumbs, thighs, or toes.  Acne or oily skin. Acne may develop during adulthood, and it may not respond to treatment.  Pelvic pain.  Weight gain or obesity.  Patches of thickened and dark brown or black skin on the neck, arms, breasts, or thighs (acanthosis nigricans).  Excess hair growth on the face, chest, abdomen, or upper thighs (hirsutism).  How is this diagnosed? This condition is diagnosed based on:  Your medical history.  A physical exam, including a pelvic exam. Your health care provider may look for areas of increased hair growth on your skin.  Tests, such as: ? Ultrasound. This may be used to examine the ovaries and the lining of the uterus (endometrium) for cysts. ? Blood tests. These may be used to check levels of sugar (glucose), female hormone (testosterone), and female hormones (estrogen and progesterone) in your blood.  How is this treated? There is no cure for PCOS, but treatment can help to manage symptoms and prevent more health problems from developing. Treatment varies depending on:  Your symptoms.  Whether you want to have a baby or whether you need birth control (contraception).  Treatment may include nutrition and lifestyle changes along with:  Progesterone hormone to start a menstrual period.  Birth control pills to help you have regular menstrual periods.  Medicines to make you ovulate, if you want to get pregnant.  Medicine to reduce excessive hair growth.  Surgery, in severe cases. This may involve making small holes in one or both of your ovaries. This decreases the amount of testosterone that your body produces.  Follow these instructions at home:  Take over-the-counter and prescription medicines only  as told by your health care provider.  Follow a healthy meal plan. This can help you reduce the effects of PCOS. ? Eat a healthy diet that includes lean proteins, complex carbohydrates, fresh fruits and vegetables, low-fat dairy products, and healthy fats. Make sure to eat enough fiber.  If you are overweight, lose weight as told by your health care provider. ? Losing 10% of your body weight may improve symptoms. ? Your health care provider can determine how much weight loss is best for you and can help you lose weight safely.  Keep all follow-up visits as told by your health care provider. This is important. Contact a health care provider if:  Your symptoms do not get better with medicine.  You develop new symptoms. This information is not intended to replace advice given to you by your health care provider. Make sure you discuss any questions you have with your health care provider. Document Released: 10/28/2004 Document Revised: 03/01/2016 Document Reviewed: 12/20/2015 Elsevier Interactive Patient Education  2018 Elsevier Inc. Abnormal Uterine Bleeding Abnormal uterine bleeding means bleeding more than usual from your uterus. It can include:  Bleeding between periods.  Bleeding after sex.  Bleeding that is heavier than normal.  Periods that last longer than usual.  Bleeding after you have stopped having your period (menopause).  There are many problems that may cause this. You should see a doctor for any kind of bleeding that is not normal. Treatment depends on the cause of the bleeding.   Follow these instructions at home:  Watch your condition for any changes.  Do not use tampons, douche, or have sex, if your doctor tells you not to.  Change your pads often.  Get regular well-woman exams. Make sure they include a pelvic exam and cervical cancer screening.  Keep all follow-up visits as told by your doctor. This is important. Contact a doctor if:  The bleeding lasts  more than one week.  You feel dizzy at times.  You feel like you are going to throw up (nauseous).  You throw up. Get help right away if:  You pass out.  You have to change pads every hour.  You have belly (abdominal) pain.  You have a fever.  You get sweaty.  You get weak.  You passing large blood clots from your vagina. Summary  Abnormal uterine bleeding means bleeding more than usual from your uterus.  There are many problems that may cause this. You should see a doctor for any kind of bleeding that is not normal.  Treatment depends on the cause of the bleeding. This information is not intended to replace advice given to you by your health care provider. Make sure you discuss any questions you have with your health care provider. Document Released: 05/01/2009 Document Revised: 06/28/2016 Document Reviewed: 06/28/2016 Elsevier Interactive Patient Education  2017 Elsevier Inc.  

## 2018-03-02 NOTE — Telephone Encounter (Signed)
Called pt to advise of [place] appt on 03/06/18 @ 11am, asked if she could arrive at 10:45 w/ full bladder.No answer, left VM.

## 2018-03-04 ENCOUNTER — Other Ambulatory Visit: Payer: Self-pay | Admitting: Internal Medicine

## 2018-03-04 DIAGNOSIS — M25532 Pain in left wrist: Secondary | ICD-10-CM

## 2018-03-04 DIAGNOSIS — M25511 Pain in right shoulder: Secondary | ICD-10-CM

## 2018-03-04 DIAGNOSIS — M25562 Pain in left knee: Secondary | ICD-10-CM

## 2018-03-04 DIAGNOSIS — G894 Chronic pain syndrome: Secondary | ICD-10-CM

## 2018-03-04 DIAGNOSIS — G8929 Other chronic pain: Secondary | ICD-10-CM

## 2018-03-04 DIAGNOSIS — M25561 Pain in right knee: Secondary | ICD-10-CM

## 2018-03-05 ENCOUNTER — Encounter: Payer: Self-pay | Admitting: Internal Medicine

## 2018-03-05 NOTE — Progress Notes (Signed)
Courtney Zamora presents for evaluation of AUB. Pt reports a H/O irregular cycles since menarche. She will skip several months between cycles. She is sexual active without contraception. She has multiple medical problems as noted in her medical records. Had been taking MTX for RA but this has been discontinued for other medications per Rheumatology. Last pap 1/18 ASCUS, no follow up.  PE morbid obese WF in NAD Lungs clear Heart RRR Abd soft + BS obese GU nl EGBUS cervix without lesions, uterus small no adnexal mass es or tenderness exam limited by pt habitus   A/P DUB  DUB reviewed with pt. Suspect related to pt's obesity and probable PCOS.  Reviewed with pt. Pt advised not to become pregnant presently until her medical problems are under better control. Diet and exercise reviewed with pt. She has been working hard on this and has lost wt over the last several months. Pt was congratulated on this accomplishment and encouraged to continue. Will check GYN U/S. Discussed possible need for [facility] and lab work. Pt declined lab work today d/t needle phobia. Has to take Ativan/Xanax before blood draws. Pt will follow up after GYN U/S completed to discuss Tx. Discussed that she probably would be a good candidate for an IUD.

## 2018-03-06 ENCOUNTER — Other Ambulatory Visit: Payer: Self-pay

## 2018-03-06 ENCOUNTER — Ambulatory Visit (HOSPITAL_COMMUNITY): Payer: Medicaid Other

## 2018-03-06 MED ORDER — GLUCOSE BLOOD VI STRP: ORAL_STRIP | 12 refills | Status: AC

## 2018-03-06 MED ORDER — ACCU-CHEK SOFTCLIX LANCETS MISC: 12 refills | Status: DC

## 2018-03-06 MED ORDER — ACCU-CHEK AVIVA PLUS W/DEVICE KIT: PACK | 0 refills | Status: DC

## 2018-03-07 LAB — CYTOLOGY - PAP
DIAGNOSIS: NEGATIVE
HPV 16/18/45 GENOTYPING: NEGATIVE
HPV: DETECTED — AB

## 2018-03-09 ENCOUNTER — Ambulatory Visit (HOSPITAL_COMMUNITY): Payer: Medicaid Other

## 2018-03-13 ENCOUNTER — Ambulatory Visit (HOSPITAL_COMMUNITY): Payer: Medicaid Other

## 2018-03-16 ENCOUNTER — Ambulatory Visit (HOSPITAL_COMMUNITY)
Admission: RE | Admit: 2018-03-16 | Discharge: 2018-03-16 | Disposition: A | Discharge status: Home / Self Care | Payer: Medicaid Other | Source: Ambulatory Visit | Attending: Obstetrics and Gynecology | Admitting: Obstetrics and Gynecology

## 2018-03-16 DIAGNOSIS — N938 Other specified abnormal uterine and vaginal bleeding: Secondary | ICD-10-CM | POA: Diagnosis not present

## 2018-03-20 ENCOUNTER — Telehealth: Payer: Self-pay

## 2018-03-20 NOTE — Telephone Encounter (Signed)
Per Dr. Alysia Penna, pt needs to be informed that her [place] is normal and to make a f/u appt to discuss tx options.

## 2018-03-21 NOTE — Telephone Encounter (Signed)
Notified pt that her [place] results are normal and to contact the office for f/u appt to discuss tx options.

## 2018-03-23 ENCOUNTER — Ambulatory Visit (INDEPENDENT_AMBULATORY_CARE_PROVIDER_SITE_OTHER): Payer: Medicaid Other | Admitting: Cardiology

## 2018-03-23 ENCOUNTER — Encounter: Payer: Self-pay | Admitting: Cardiology

## 2018-03-23 VITALS — BP 126/82 | HR 86 | Ht 64.0 in | Wt 391.8 lb

## 2018-03-23 DIAGNOSIS — R000 Tachycardia, unspecified: Secondary | ICD-10-CM | POA: Diagnosis not present

## 2018-03-23 DIAGNOSIS — M7989 Other specified soft tissue disorders: Secondary | ICD-10-CM

## 2018-03-23 DIAGNOSIS — R002 Palpitations: Secondary | ICD-10-CM | POA: Diagnosis not present

## 2018-03-23 NOTE — Patient Instructions (Signed)
Medication Instructions: Your physician recommends that you continue on your current medications as directed.    If you need a refill on your cardiac medications before your next appointment, please call your pharmacy.   Labwork: None  Procedures/Testing: Our physician has recommended that you wear 14 DAY ZIO-PATCH monitor. The Zio patch cardiac monitor continuously records heart rhythm data for up to 14 days, this is for patients being evaluated for multiple types heart rhythms. For the first 24 hours post application, please avoid getting the Zio monitor wet in the shower or by excessive sweating during exercise. After that, feel free to carry on with regular activities. Keep soaps and lotions away from the ZIO XT Patch.   This will be placed at our [facility] location - [address].        Follow-Up: Your physician wants you to follow-up as needed with Dr. Cristal Deer. Please call our office at [phone] if you have any additional questions   Special Instructions:    Thank you for choosing [facility] at [facility]!!

## 2018-03-23 NOTE — Progress Notes (Addendum)
Cardiology Office Note:    Date:  03/23/2018   ID:  Courtney Zamora, DOB Sep 12, 1985, MRN [id number]  PCP:  Ladell Pier, MD  Cardiologist:  Buford Dresser, MD PhD  Referring MD: Ladell Pier, MD   CC: sinus tachycardia  History of Present Illness:    Courtney Zamora is a 32 y.o. female with a hx of hypertension, asthma, type II diabetes, morbid obesity with BMI 67 who is seen as a new consult at the request of Ladell Pier, MD for the evaluation and management of tachycardia.  Her concerns: was told she has a long Qt. Palpitations  Tachycardia/palpitations: -Initial onset: a year ago -Frequency: sometimes multiple times/day, sometimes days between -Duration: 5-10 minutes -Triggers: laying in bed, anxiety -Aggravating/alleviating factors: none -Syncope/near syncope: none from palpitations. Has had syncope, last time 3-4 months ago with blood draw -Prior cardiac history: none -Prior ECG: sinus tachycardia -Prior workup: no monitor -Prior treatment: on atenolol 12.5 mg daily started in July 2019. 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 -Cardiac ROS: no chest pain, no shortness of breath, no PND, no orthopnea, no LE edema. -Family history: grandfather with CHF, maternal grandmother as well.    Past Medical History:  Diagnosis Date  . Anxiety   . Asthma   . Depression   . Essential hypertension   . GERD (gastroesophageal reflux disease)   . Obesity   . Osteoarthritis, knee   . Seizures ([place])   . Severe needle phobia     Past  Surgical History:  Procedure Laterality Date  . TONSILLECTOMY      Current Medications: Current Outpatient Medications on File Prior to Visit  Medication Sig  . ACCU-CHEK SOFTCLIX LANCETS lancets UAD UP TO TID TO CHECK BLOOD SUGAR  . acetaminophen-codeine (TYLENOL #3) 300-30 MG tablet TAKE 1 TABLET BY MOUTH EVERY 6 HOURS AS NEEDED FOR MODERATE PAIN  . albuterol (PROVENTIL HFA;VENTOLIN HFA) 108 (90 Base) MCG/ACT inhaler Inhale 2 puffs into the lungs every 6 (six) hours as needed for wheezing or shortness of breath (wheezing). For shortness of breath.  Marland Kitchen albuterol (PROVENTIL) (2.5 MG/3ML) 0.083% nebulizer solution Take 3 mLs (2.5 mg total) by nebulization every 6 (six) hours as needed for wheezing or shortness of breath.  Marland Kitchen atenolol (TENORMIN) 25 MG tablet Take 0.5 tablets (12.5 mg total) by mouth daily.  Marland Kitchen BELBUCA 150 MCG FILM Place 150 mcg inside cheek 2 (two) times daily. Place inside cheek 2 times daily. Bid every 12 hours  . Biotin w/ Vitamins C & E (HAIR/SKIN/NAILS PO) Take 1 tablet by mouth daily.  . Blood Glucose Monitoring Suppl (ACCU-CHEK AVIVA PLUS) w/Device KIT UAD UP TO TID TO TEST BLOOD SUGAR  . Calcium Citrate 200 MG TABS Take 2 tablets (400 mg total) by mouth every other day. (Patient taking differently: Take 200 mg by mouth daily. )  . cetirizine (ZYRTEC) 10 MG tablet TAKE 1 TABLET BY MOUTH EVERY NIGHT AT BEDTIME  . DULoxetine (CYMBALTA) 30 MG capsule TAKE 1 CAPSULE BY MOUTH TWICE DAILY  . etanercept (ENBREL SURECLICK) 50 MG/ML injection Inject 50 mg as directed once a week. Inject 50 mg into skin onve a week  . furosemide (LASIX)  20 MG tablet Take 20 mg by mouth every other day.  . gabapentin (NEURONTIN) 300 MG capsule TAKE 1 CAPSULE BY MOUTH EVERY MORNING, 1 CAPSULE BY MOUTH AT NOON, AND 2 CAPSULES BY MOUTH AT BEDTIME  . glucose blood (ACCU-CHEK AVIVA PLUS) test strip UAD UP TO TID TO CHECK BLOOD SUGAR  . hydrochlorothiazide (HYDRODIURIL) 25 MG tablet Take 1 tablet (25 mg  total) by mouth daily.  . hydrOXYzine (ATARAX/VISTARIL) 10 MG tablet Take 1 tablet (10 mg total) by mouth 3 (three) times daily as needed.  . insulin aspart (NOVOLOG) 100 UNIT/ML injection Inject 15 units in a.m with BF, 17 unis with lunch and 15 unis Subcut with dinner  . insulin glargine (LANTUS) 100 UNIT/ML injection Inject 0.52 mLs (52 Units total) into the skin at bedtime.  . Insulin Pen Needle (PEN NEEDLES) 31G X 8 MM MISC Use as directed  . ipratropium-albuterol (DUONEB) 0.5-2.5 (3) MG/3ML SOLN Take 3 mLs by nebulization every 6 (six) hours as needed. (Patient taking differently: Take 3 mLs by nebulization every 4 (four) hours as needed (for shortness of breath or wheezing). )  . liraglutide (VICTOZA) 18 MG/3ML SOPN Inject 0.2 mLs (1.2 mg total) into the skin daily.  . montelukast (SINGULAIR) 10 MG tablet Take 1 tablet (10 mg total) by mouth daily.  . Multiple Vitamins-Minerals (WOMENS MULTI) CAPS Take 1 capsule by mouth daily.  . pantoprazole (PROTONIX) 40 MG tablet TAKE 1 TABLET BY MOUTH 2 TIMES DAILY BEFORE A MEAL.  . predniSONE (DELTASONE) 5 MG tablet TAKE 1 TABLET BY MOUTH EVERY DAY WITH BREAKFAST  . promethazine (PHENERGAN) 25 MG tablet Take 1 tablet (25 mg total) by mouth every 8 (eight) hours as needed for nausea or vomiting.  Marland Kitchen Respiratory Therapy Supplies (FLUTTER) DEVI As directed.  . sitaGLIPtin (JANUVIA) 50 MG tablet Take 1 tablet (50 mg total) by mouth daily.  Marland Kitchen Spacer/Aero-Holding Chambers (AEROCHAMBER MV) inhaler Use as instructed  . tiZANidine (ZANAFLEX) 4 MG tablet Take 1 tablet (4 mg total) by mouth every 6 (six) hours as needed for muscle spasms.  . folic acid (FOLVITE) 1 MG tablet Take 1 tablet (1 mg total) by mouth daily. (Patient not taking: Reported on 03/02/2018)  . methotrexate (RHEUMATREX) 2.5 MG tablet Take 3 tablets (7.5 mg total) by mouth once a week. Caution:Chemotherapy. Protect from light.   No current facility-administered medications on file prior to visit.       Allergies:   Bee venom; Cinnamon; Doxycycline; Ibuprofen; Lexapro [escitalopram oxalate]; and Tape   Social History   Socioeconomic History  . Marital status: Married    Spouse name: Not on file  . Number of children: Not on file  . Years of education: Not on file  . Highest education level: Not on file  Occupational History  . Not on file  Social Needs  . Financial resource strain: Not on file  . Food insecurity:    Worry: Not on file    Inability: Not on file  . Transportation needs:    Medical: Not on file    Non-medical: Not on file  Tobacco Use  . Smoking status: Never Smoker  . Smokeless tobacco: Never Used  Substance and Sexual Activity  . Alcohol use: No  . Drug use: No  . Sexual activity: Yes    Birth control/protection: None  Lifestyle  . Physical activity:    Days per week: Not on file    Minutes per session: Not on file  . Stress: Not  on file  Relationships  . Social connections:    Talks on phone: Not on file    Gets together: Not on file    Attends religious service: Not on file    Active member of club or organization: Not on file    Attends meetings of clubs or organizations: Not on file    Relationship status: Not on file  Other Topics Concern  . Not on file  Social History Narrative  . Not on file     Family History: The patient's family history includes Diabetes in her father and paternal uncle; Heart disease in her paternal grandfather; Hypertension in her maternal grandfather, maternal grandmother, paternal grandfather, and paternal grandmother; Hypothyroidism in her mother; Pancreatic cancer in her maternal grandfather.  ROS:   Please see the history of present illness.  Additional pertinent ROS: ROS   EKGs/Labs/Other Studies Reviewed:    The following studies were reviewed today: ECG 2017-12-24: sinus tach at 116 ECG 11/25/17: sinus tach at 115 ECG 10/11/17: sinus tach at 110 ECG 09/09/17: sinus rhythm at 98  Echo 08/17/17 Study  Conclusions  - Left ventricle: The cavity size was normal. Systolic function was   normal. The estimated ejection fraction was in the range of 60%   to 65%. Wall motion was normal; there were no regional wall   motion abnormalities. Left ventricular diastolic function   parameters were normal. - Aortic valve: There was no regurgitation. - Aortic root: The aortic root was normal in size. - Mitral valve: There was no regurgitation. - Left atrium: The atrium was normal in size. - Right ventricle: The cavity size was normal. Wall thickness was   normal. Systolic function was normal. - Right atrium: The atrium was normal in size. - Tricuspid valve: There was no regurgitation. - Pulmonary arteries: Systolic pressure was within the normal   range. - Inferior vena cava: The vessel was normal in size. - Pericardium, extracardiac: There was no pericardial effusion.  Impressions: - Normal study.  EKG:  EKG is ordered today.  The ekg ordered today demonstrates normal sinus rhythm, HR 86. My manual QT is 360 ms/corrected 431 ms.  Recent Labs: 09/08/2017: Magnesium 2.1 10/20/2017: TSH 4.440 11/25/2017: ALT 56 01/29/2018: BUN 17; Creatinine, Ser 0.59; Hemoglobin 11.3; Platelets 417; Potassium 3.5; Sodium 142  Recent Lipid Panel    Component Value Date/Time   CHOL 164 09/05/2017 1800   TRIG 109 09/05/2017 1800   HDL 58 09/05/2017 1800   CHOLHDL 2.8 09/05/2017 1800   VLDL 22 09/05/2017 1800   LDLCALC 84 09/05/2017 1800    Physical Exam:    VS:  BP 126/82   Pulse 86   Ht 5' 4" (1.626 m)   Wt (!) 391 lb 12.8 oz (177.7 kg)   BMI 67.25 kg/m     Wt Readings from Last 3 Encounters:  03/23/18 (!) 391 lb 12.8 oz (177.7 kg)  03/02/18 (!) 383 lb 8 oz (174 kg)  02/12/18 (!) 391 lb (177.4 kg)     GEN: Well nourished, well developed in no acute distress HEENT: Normal NECK: No JVD; No carotid bruits LYMPHATICS: No lymphadenopathy CARDIAC: regular rhythm, normal S1 and S2, no murmurs, rubs,  gallops. Radial and DP pulses 2+ bilaterally. RESPIRATORY:  Clear to auscultation without rales, wheezing or rhonchi  ABDOMEN: Soft, non-tender, non-distended MUSCULOSKELETAL:  Distended but no pitting edema; No deformity  SKIN: Warm and dry, Healing small lesions on bilateral lower  NEUROLOGIC:  Alert and oriented x 3 PSYCHIATRIC:  Normal affect   ASSESSMENT:    1. Palpitation   2. Leg swelling   3. Sinus tachycardia by electrocardiography    PLAN:    1. Palpitations, sinus tachycardia:  Improved on atenolol by ECG, but no change in symptoms. Having intermittent palpitations as well. Has never had a monitor. Would be useful to know if she is having any arrhythmia causing her palpitations or if she is just more aware of her heart beating. Would also be good to know if she has high/low rates on her monitor.  -14 day ziopatch -continue atenolol for now. Has not affected her breathing. She does have difficulty cutting the pill in half, may be easier to use full dose if she tolerates. Will re-evaluate once monitor results return. -she has not yet started CPAP, plans to use it once it is set up.  2. Leg swelling: not pitting edema. May be dependent edema. Already on furosemide.  Plan for follow up: PRN  Medication Adjustments/Labs and Tests Ordered: Current medicines are reviewed at length with the patient today.  Concerns regarding medicines are outlined above.  Orders Placed This Encounter  Procedures  . LONG TERM MONITOR (3-14 DAYS)  . EKG 12-Lead   No orders of the defined types were placed in this encounter.   Patient Instructions  Medication Instructions: Your physician recommends that you continue on your current medications as directed.    If you need a refill on your cardiac medications before your next appointment, please call your pharmacy.   Labwork: None  Procedures/Testing: Our physician has recommended that you wear 14 DAY ZIO-PATCH monitor. The Zio patch  cardiac monitor continuously records heart rhythm data for up to 14 days, this is for patients being evaluated for multiple types heart rhythms. For the first 24 hours post application, please avoid getting the Zio monitor wet in the shower or by excessive sweating during exercise. After that, feel free to carry on with regular activities. Keep soaps and lotions away from the ZIO XT Patch.   This will be placed at our [facility] location - [address], [address].        Follow-Up: Your physician wants you to follow-up as needed with Dr. Harrell Gave. Please call our office at [phone] if you have any additional questions   Special Instructions:    Thank you for choosing Heartcare at [facility]!!       Signed, Buford Dresser, MD PhD 03/23/2018 7:55 AM    [place]

## 2018-03-29 ENCOUNTER — Other Ambulatory Visit: Payer: Self-pay | Admitting: Internal Medicine

## 2018-03-29 DIAGNOSIS — I10 Essential (primary) hypertension: Secondary | ICD-10-CM

## 2018-04-02 ENCOUNTER — Other Ambulatory Visit: Payer: Self-pay | Admitting: Gastroenterology

## 2018-04-02 ENCOUNTER — Telehealth: Payer: Self-pay | Admitting: Internal Medicine

## 2018-04-02 ENCOUNTER — Ambulatory Visit: Payer: Self-pay | Admitting: Internal Medicine

## 2018-04-02 NOTE — Telephone Encounter (Signed)
Patient called and sated she no longer wants to be seen at our clinic and the patient processed to cures me out.

## 2018-04-02 NOTE — Telephone Encounter (Signed)
Patient contacted office and stated she "I no longer wanted to be see at your fucking clinic because the wait time on the phone are fucking ridicules ."

## 2018-04-03 ENCOUNTER — Other Ambulatory Visit (HOSPITAL_COMMUNITY): Payer: Self-pay | Admitting: Gastroenterology

## 2018-04-03 DIAGNOSIS — R1310 Dysphagia, unspecified: Secondary | ICD-10-CM

## 2018-04-05 NOTE — Telephone Encounter (Signed)
Our office administrator has been informed of the above phone call conversation.

## 2018-04-08 ENCOUNTER — Encounter (HOSPITAL_COMMUNITY): Payer: Self-pay | Admitting: Emergency Medicine

## 2018-04-08 ENCOUNTER — Other Ambulatory Visit: Payer: Self-pay

## 2018-04-08 ENCOUNTER — Emergency Department (HOSPITAL_COMMUNITY): Payer: Medicaid Other

## 2018-04-08 ENCOUNTER — Inpatient Hospital Stay (HOSPITAL_COMMUNITY)
Admission: EM | Admit: 2018-04-08 | Discharge: 2018-04-10 | DRG: 189 | Disposition: A | Discharge status: Home Health | Payer: Medicaid Other | Attending: Student in an Organized Health Care Education/Training Program | Admitting: Student in an Organized Health Care Education/Training Program

## 2018-04-08 DIAGNOSIS — M1710 Unilateral primary osteoarthritis, unspecified knee: Secondary | ICD-10-CM | POA: Diagnosis present

## 2018-04-08 DIAGNOSIS — M7989 Other specified soft tissue disorders: Secondary | ICD-10-CM | POA: Diagnosis present

## 2018-04-08 DIAGNOSIS — J45909 Unspecified asthma, uncomplicated: Secondary | ICD-10-CM | POA: Diagnosis present

## 2018-04-08 DIAGNOSIS — Z888 Allergy status to other drugs, medicaments and biological substances status: Secondary | ICD-10-CM

## 2018-04-08 DIAGNOSIS — J9600 Acute respiratory failure, unspecified whether with hypoxia or hypercapnia: Secondary | ICD-10-CM | POA: Diagnosis present

## 2018-04-08 DIAGNOSIS — R040 Epistaxis: Secondary | ICD-10-CM | POA: Diagnosis present

## 2018-04-08 DIAGNOSIS — Z91048 Other nonmedicinal substance allergy status: Secondary | ICD-10-CM

## 2018-04-08 DIAGNOSIS — F419 Anxiety disorder, unspecified: Secondary | ICD-10-CM | POA: Diagnosis present

## 2018-04-08 DIAGNOSIS — F4310 Post-traumatic stress disorder, unspecified: Secondary | ICD-10-CM | POA: Diagnosis present

## 2018-04-08 DIAGNOSIS — J189 Pneumonia, unspecified organism: Secondary | ICD-10-CM

## 2018-04-08 DIAGNOSIS — G4733 Obstructive sleep apnea (adult) (pediatric): Secondary | ICD-10-CM | POA: Diagnosis present

## 2018-04-08 DIAGNOSIS — K760 Fatty (change of) liver, not elsewhere classified: Secondary | ICD-10-CM | POA: Diagnosis present

## 2018-04-08 DIAGNOSIS — R9389 Abnormal findings on diagnostic imaging of other specified body structures: Secondary | ICD-10-CM | POA: Diagnosis present

## 2018-04-08 DIAGNOSIS — S93409A Sprain of unspecified ligament of unspecified ankle, initial encounter: Secondary | ICD-10-CM | POA: Diagnosis not present

## 2018-04-08 DIAGNOSIS — M25561 Pain in right knee: Secondary | ICD-10-CM | POA: Diagnosis present

## 2018-04-08 DIAGNOSIS — K219 Gastro-esophageal reflux disease without esophagitis: Secondary | ICD-10-CM | POA: Diagnosis present

## 2018-04-08 DIAGNOSIS — Z881 Allergy status to other antibiotic agents status: Secondary | ICD-10-CM

## 2018-04-08 DIAGNOSIS — R569 Unspecified convulsions: Secondary | ICD-10-CM | POA: Diagnosis present

## 2018-04-08 DIAGNOSIS — I10 Essential (primary) hypertension: Secondary | ICD-10-CM | POA: Diagnosis present

## 2018-04-08 DIAGNOSIS — Z800 Family history of malignant neoplasm of digestive organs: Secondary | ICD-10-CM

## 2018-04-08 DIAGNOSIS — Y92231 Patient bathroom in hospital as the place of occurrence of the external cause: Secondary | ICD-10-CM | POA: Diagnosis not present

## 2018-04-08 DIAGNOSIS — M25562 Pain in left knee: Secondary | ICD-10-CM | POA: Diagnosis present

## 2018-04-08 DIAGNOSIS — Z91030 Bee allergy status: Secondary | ICD-10-CM

## 2018-04-08 DIAGNOSIS — G8929 Other chronic pain: Secondary | ICD-10-CM | POA: Diagnosis present

## 2018-04-08 DIAGNOSIS — Z7951 Long term (current) use of inhaled steroids: Secondary | ICD-10-CM

## 2018-04-08 DIAGNOSIS — M0579 Rheumatoid arthritis with rheumatoid factor of multiple sites without organ or systems involvement: Secondary | ICD-10-CM | POA: Diagnosis present

## 2018-04-08 DIAGNOSIS — Z833 Family history of diabetes mellitus: Secondary | ICD-10-CM

## 2018-04-08 DIAGNOSIS — Z6841 Body Mass Index (BMI) 40.0 and over, adult: Secondary | ICD-10-CM

## 2018-04-08 DIAGNOSIS — R0902 Hypoxemia: Secondary | ICD-10-CM

## 2018-04-08 DIAGNOSIS — Z886 Allergy status to analgesic agent status: Secondary | ICD-10-CM

## 2018-04-08 DIAGNOSIS — E1165 Type 2 diabetes mellitus with hyperglycemia: Secondary | ICD-10-CM | POA: Diagnosis present

## 2018-04-08 DIAGNOSIS — F329 Major depressive disorder, single episode, unspecified: Secondary | ICD-10-CM | POA: Diagnosis present

## 2018-04-08 DIAGNOSIS — J9601 Acute respiratory failure with hypoxia: Principal | ICD-10-CM | POA: Diagnosis present

## 2018-04-08 DIAGNOSIS — Z8249 Family history of ischemic heart disease and other diseases of the circulatory system: Secondary | ICD-10-CM

## 2018-04-08 DIAGNOSIS — Z7952 Long term (current) use of systemic steroids: Secondary | ICD-10-CM

## 2018-04-08 DIAGNOSIS — Z794 Long term (current) use of insulin: Secondary | ICD-10-CM

## 2018-04-08 HISTORY — DX: Migraine, unspecified, not intractable, without status migrainosus: G43.909

## 2018-04-08 HISTORY — DX: Family history of other specified conditions: Z84.89

## 2018-04-08 HISTORY — DX: Manic episode, unspecified: F30.9

## 2018-04-08 HISTORY — DX: Headache: R51

## 2018-04-08 HISTORY — DX: Dependence on other enabling machines and devices: Z99.89

## 2018-04-08 HISTORY — DX: Obstructive sleep apnea (adult) (pediatric): G47.33

## 2018-04-08 HISTORY — DX: Pneumonia, unspecified organism: J18.9

## 2018-04-08 HISTORY — DX: Long QT syndrome: I45.81

## 2018-04-08 HISTORY — DX: Rheumatoid arthritis, unspecified: M06.9

## 2018-04-08 HISTORY — DX: Low back pain, unspecified: M54.50

## 2018-04-08 HISTORY — DX: Post-traumatic stress disorder, unspecified: F43.10

## 2018-04-08 HISTORY — DX: Other chronic pain: G89.29

## 2018-04-08 HISTORY — DX: Low back pain: M54.5

## 2018-04-08 HISTORY — DX: Headache, unspecified: R51.9

## 2018-04-08 HISTORY — DX: Type 2 diabetes mellitus without complications: E11.9

## 2018-04-08 LAB — POC URINE PREG, ED: Preg Test, Ur: NEGATIVE

## 2018-04-08 NOTE — ED Triage Notes (Signed)
Pt states she has a chicken bone stuck in throat x 2 hours.  States she has tried eating bread and cornbread to get it unstuck without relief.

## 2018-04-09 ENCOUNTER — Encounter (HOSPITAL_COMMUNITY): Payer: Self-pay | Admitting: General Practice

## 2018-04-09 ENCOUNTER — Emergency Department (HOSPITAL_COMMUNITY): Payer: Medicaid Other

## 2018-04-09 ENCOUNTER — Other Ambulatory Visit: Payer: Self-pay

## 2018-04-09 ENCOUNTER — Ambulatory Visit (HOSPITAL_COMMUNITY): Admission: RE | Admit: 2018-04-09 | Payer: Medicaid Other | Source: Ambulatory Visit

## 2018-04-09 ENCOUNTER — Inpatient Hospital Stay (HOSPITAL_COMMUNITY): Payer: Medicaid Other

## 2018-04-09 DIAGNOSIS — Z7952 Long term (current) use of systemic steroids: Secondary | ICD-10-CM | POA: Diagnosis not present

## 2018-04-09 DIAGNOSIS — G4733 Obstructive sleep apnea (adult) (pediatric): Secondary | ICD-10-CM | POA: Diagnosis not present

## 2018-04-09 DIAGNOSIS — J9600 Acute respiratory failure, unspecified whether with hypoxia or hypercapnia: Secondary | ICD-10-CM | POA: Diagnosis present

## 2018-04-09 DIAGNOSIS — J9601 Acute respiratory failure with hypoxia: Secondary | ICD-10-CM | POA: Diagnosis present

## 2018-04-09 DIAGNOSIS — E119 Type 2 diabetes mellitus without complications: Secondary | ICD-10-CM

## 2018-04-09 DIAGNOSIS — Z886 Allergy status to analgesic agent status: Secondary | ICD-10-CM | POA: Diagnosis not present

## 2018-04-09 DIAGNOSIS — J3481 Nasal mucositis (ulcerative): Secondary | ICD-10-CM | POA: Diagnosis not present

## 2018-04-09 DIAGNOSIS — F329 Major depressive disorder, single episode, unspecified: Secondary | ICD-10-CM | POA: Diagnosis present

## 2018-04-09 DIAGNOSIS — Z91030 Bee allergy status: Secondary | ICD-10-CM | POA: Diagnosis not present

## 2018-04-09 DIAGNOSIS — Z794 Long term (current) use of insulin: Secondary | ICD-10-CM | POA: Diagnosis not present

## 2018-04-09 DIAGNOSIS — L97911 Non-pressure chronic ulcer of unspecified part of right lower leg limited to breakdown of skin: Secondary | ICD-10-CM

## 2018-04-09 DIAGNOSIS — Z8249 Family history of ischemic heart disease and other diseases of the circulatory system: Secondary | ICD-10-CM | POA: Diagnosis not present

## 2018-04-09 DIAGNOSIS — K219 Gastro-esophageal reflux disease without esophagitis: Secondary | ICD-10-CM

## 2018-04-09 DIAGNOSIS — Y92231 Patient bathroom in hospital as the place of occurrence of the external cause: Secondary | ICD-10-CM | POA: Diagnosis not present

## 2018-04-09 DIAGNOSIS — I10 Essential (primary) hypertension: Secondary | ICD-10-CM | POA: Diagnosis present

## 2018-04-09 DIAGNOSIS — M0579 Rheumatoid arthritis with rheumatoid factor of multiple sites without organ or systems involvement: Secondary | ICD-10-CM | POA: Diagnosis not present

## 2018-04-09 DIAGNOSIS — F40231 Fear of injections and transfusions: Secondary | ICD-10-CM

## 2018-04-09 DIAGNOSIS — G894 Chronic pain syndrome: Secondary | ICD-10-CM

## 2018-04-09 DIAGNOSIS — L97921 Non-pressure chronic ulcer of unspecified part of left lower leg limited to breakdown of skin: Secondary | ICD-10-CM

## 2018-04-09 DIAGNOSIS — Z881 Allergy status to other antibiotic agents status: Secondary | ICD-10-CM

## 2018-04-09 DIAGNOSIS — F4310 Post-traumatic stress disorder, unspecified: Secondary | ICD-10-CM

## 2018-04-09 DIAGNOSIS — K760 Fatty (change of) liver, not elsewhere classified: Secondary | ICD-10-CM

## 2018-04-09 DIAGNOSIS — J45909 Unspecified asthma, uncomplicated: Secondary | ICD-10-CM | POA: Diagnosis present

## 2018-04-09 DIAGNOSIS — L989 Disorder of the skin and subcutaneous tissue, unspecified: Secondary | ICD-10-CM

## 2018-04-09 DIAGNOSIS — M25561 Pain in right knee: Secondary | ICD-10-CM | POA: Diagnosis present

## 2018-04-09 DIAGNOSIS — Z7951 Long term (current) use of inhaled steroids: Secondary | ICD-10-CM | POA: Diagnosis not present

## 2018-04-09 DIAGNOSIS — R0902 Hypoxemia: Secondary | ICD-10-CM | POA: Diagnosis not present

## 2018-04-09 DIAGNOSIS — Z888 Allergy status to other drugs, medicaments and biological substances status: Secondary | ICD-10-CM

## 2018-04-09 DIAGNOSIS — Z91048 Other nonmedicinal substance allergy status: Secondary | ICD-10-CM

## 2018-04-09 DIAGNOSIS — M25562 Pain in left knee: Secondary | ICD-10-CM | POA: Diagnosis present

## 2018-04-09 DIAGNOSIS — R0602 Shortness of breath: Secondary | ICD-10-CM | POA: Diagnosis not present

## 2018-04-09 DIAGNOSIS — Z800 Family history of malignant neoplasm of digestive organs: Secondary | ICD-10-CM | POA: Diagnosis not present

## 2018-04-09 DIAGNOSIS — E1165 Type 2 diabetes mellitus with hyperglycemia: Secondary | ICD-10-CM | POA: Diagnosis present

## 2018-04-09 DIAGNOSIS — Z79899 Other long term (current) drug therapy: Secondary | ICD-10-CM

## 2018-04-09 DIAGNOSIS — R0989 Other specified symptoms and signs involving the circulatory and respiratory systems: Secondary | ICD-10-CM | POA: Diagnosis not present

## 2018-04-09 DIAGNOSIS — R002 Palpitations: Secondary | ICD-10-CM

## 2018-04-09 DIAGNOSIS — R569 Unspecified convulsions: Secondary | ICD-10-CM | POA: Diagnosis present

## 2018-04-09 DIAGNOSIS — Z833 Family history of diabetes mellitus: Secondary | ICD-10-CM | POA: Diagnosis not present

## 2018-04-09 DIAGNOSIS — F419 Anxiety disorder, unspecified: Secondary | ICD-10-CM

## 2018-04-09 DIAGNOSIS — R918 Other nonspecific abnormal finding of lung field: Secondary | ICD-10-CM | POA: Diagnosis not present

## 2018-04-09 DIAGNOSIS — R040 Epistaxis: Secondary | ICD-10-CM | POA: Diagnosis not present

## 2018-04-09 DIAGNOSIS — Z6841 Body Mass Index (BMI) 40.0 and over, adult: Secondary | ICD-10-CM | POA: Diagnosis not present

## 2018-04-09 DIAGNOSIS — G8929 Other chronic pain: Secondary | ICD-10-CM | POA: Diagnosis present

## 2018-04-09 DIAGNOSIS — Z8701 Personal history of pneumonia (recurrent): Secondary | ICD-10-CM

## 2018-04-09 DIAGNOSIS — Z8742 Personal history of other diseases of the female genital tract: Secondary | ICD-10-CM | POA: Insufficient documentation

## 2018-04-09 DIAGNOSIS — Z91018 Allergy to other foods: Secondary | ICD-10-CM

## 2018-04-09 LAB — GLUCOSE, CAPILLARY
Glucose-Capillary: 187 mg/dL — ABNORMAL HIGH (ref 70–99)
Glucose-Capillary: 259 mg/dL — ABNORMAL HIGH (ref 70–99)

## 2018-04-09 LAB — CBC WITH DIFFERENTIAL/PLATELET
Abs Immature Granulocytes: 0.1 10*3/uL (ref 0.0–0.1)
Basophils Absolute: 0.1 10*3/uL (ref 0.0–0.1)
Basophils Relative: 1 %
Eosinophils Absolute: 0.2 10*3/uL (ref 0.0–0.7)
Eosinophils Relative: 2 %
HCT: 42.1 % (ref 36.0–46.0)
Hemoglobin: 12.2 g/dL (ref 12.0–15.0)
Immature Granulocytes: 1 %
Lymphocytes Relative: 25 %
Lymphs Abs: 2.9 10*3/uL (ref 0.7–4.0)
MCH: 25.6 pg — ABNORMAL LOW (ref 26.0–34.0)
MCHC: 29 g/dL — ABNORMAL LOW (ref 30.0–36.0)
MCV: 88.4 fL (ref 78.0–100.0)
Monocytes Absolute: 0.9 10*3/uL (ref 0.1–1.0)
Monocytes Relative: 8 %
Neutro Abs: 7.5 10*3/uL (ref 1.7–7.7)
Neutrophils Relative %: 63 %
Platelets: 348 10*3/uL (ref 150–400)
RBC: 4.76 MIL/uL (ref 3.87–5.11)
RDW: 16.7 % — ABNORMAL HIGH (ref 11.5–15.5)
WBC: 11.7 10*3/uL — ABNORMAL HIGH (ref 4.0–10.5)

## 2018-04-09 LAB — BASIC METABOLIC PANEL
Anion gap: 12 (ref 5–15)
BUN: 17 mg/dL (ref 6–20)
CO2: 26 mmol/L (ref 22–32)
Calcium: 8.8 mg/dL — ABNORMAL LOW (ref 8.9–10.3)
Chloride: 97 mmol/L — ABNORMAL LOW (ref 98–111)
Creatinine, Ser: 0.9 mg/dL (ref 0.44–1.00)
GFR calc Af Amer: >60 mL/min (ref >60)
GFR calc non Af Amer: >60 mL/min (ref >60)
Glucose, Bld: 171 mg/dL — ABNORMAL HIGH (ref 70–99)
Potassium: 5 mmol/L (ref 3.5–5.1)
Sodium: 135 mmol/L (ref 135–145)

## 2018-04-09 LAB — ECHOCARDIOGRAM COMPLETE

## 2018-04-09 LAB — BRAIN NATRIURETIC PEPTIDE: B Natriuretic Peptide: 23.4 pg/mL (ref 0.0–100.0)

## 2018-04-09 MED ORDER — SENNOSIDES-DOCUSATE SODIUM 8.6-50 MG PO TABS: 1.0000 | ORAL_TABLET | Freq: Every evening | ORAL | Status: DC | PRN

## 2018-04-09 MED ORDER — GABAPENTIN 300 MG PO CAPS
300.0000 mg | ORAL_CAPSULE | Freq: Two times a day (BID) | ORAL | Status: DC
  Administered 2018-04-09 – 2018-04-10 (×3): 300 mg via ORAL
  Filled 2018-04-09 (×3): qty 1

## 2018-04-09 MED ORDER — DULOXETINE HCL 30 MG PO CPEP
30.0000 mg | ORAL_CAPSULE | Freq: Two times a day (BID) | ORAL | Status: DC
  Administered 2018-04-09 – 2018-04-10 (×2): 30 mg via ORAL
  Filled 2018-04-09 (×3): qty 1

## 2018-04-09 MED ORDER — IPRATROPIUM-ALBUTEROL 0.5-2.5 (3) MG/3ML IN SOLN: 3.0000 mL | RESPIRATORY_TRACT | Status: DC | PRN

## 2018-04-09 MED ORDER — HYDROCHLOROTHIAZIDE 25 MG PO TABS
25.0000 mg | ORAL_TABLET | Freq: Every day | ORAL | Status: DC
  Administered 2018-04-10: 25 mg via ORAL
  Filled 2018-04-09 (×2): qty 1

## 2018-04-09 MED ORDER — BUDESONIDE 0.25 MG/2ML IN SUSP: 0.2500 mg | Freq: Two times a day (BID) | RESPIRATORY_TRACT | Status: DC

## 2018-04-09 MED ORDER — LEVOFLOXACIN 750 MG PO TABS: 750.0000 mg | ORAL_TABLET | Freq: Every day | ORAL | Status: DC

## 2018-04-09 MED ORDER — FUROSEMIDE 10 MG/ML IJ SOLN
40.0000 mg | Freq: Once | INTRAMUSCULAR | Status: AC
  Administered 2018-04-09: 40 mg via INTRAVENOUS
  Filled 2018-04-09: qty 4

## 2018-04-09 MED ORDER — IPRATROPIUM-ALBUTEROL 0.5-2.5 (3) MG/3ML IN SOLN: 3.0000 mL | Freq: Three times a day (TID) | RESPIRATORY_TRACT | Status: DC

## 2018-04-09 MED ORDER — BUDESONIDE 0.25 MG/2ML IN SUSP
0.2500 mg | Freq: Two times a day (BID) | RESPIRATORY_TRACT | Status: DC
  Administered 2018-04-09: 0.25 mg via RESPIRATORY_TRACT
  Filled 2018-04-09 (×3): qty 2

## 2018-04-09 MED ORDER — PREDNISONE 20 MG PO TABS
40.0000 mg | ORAL_TABLET | Freq: Every day | ORAL | Status: DC
  Administered 2018-04-09: 40 mg via ORAL
  Filled 2018-04-09 (×2): qty 2

## 2018-04-09 MED ORDER — ACETAMINOPHEN 325 MG PO TABS
650.0000 mg | ORAL_TABLET | Freq: Four times a day (QID) | ORAL | Status: DC | PRN
  Administered 2018-04-09: 650 mg via ORAL
  Filled 2018-04-09 (×2): qty 2

## 2018-04-09 MED ORDER — GI COCKTAIL ~~LOC~~
30.0000 mL | Freq: Once | ORAL | Status: AC
  Administered 2018-04-09: 30 mL via ORAL
  Filled 2018-04-09: qty 30

## 2018-04-09 MED ORDER — INSULIN ASPART 100 UNIT/ML ~~LOC~~ SOLN
12.0000 [IU] | Freq: Three times a day (TID) | SUBCUTANEOUS | Status: DC
  Administered 2018-04-09 – 2018-04-10 (×4): 12 [IU] via SUBCUTANEOUS

## 2018-04-09 MED ORDER — ENOXAPARIN SODIUM 40 MG/0.4ML ~~LOC~~ SOLN
40.0000 mg | SUBCUTANEOUS | Status: DC
  Filled 2018-04-09: qty 0.4

## 2018-04-09 MED ORDER — BUPRENORPHINE HCL 300 MCG BU FILM: 300.0000 ug | ORAL_FILM | Freq: Two times a day (BID) | BUCCAL | Status: DC

## 2018-04-09 MED ORDER — SODIUM CHLORIDE 0.9 % IV SOLN
500.0000 mg | Freq: Once | INTRAVENOUS | Status: AC
  Administered 2018-04-09: 500 mg via INTRAVENOUS
  Filled 2018-04-09: qty 500

## 2018-04-09 MED ORDER — IPRATROPIUM-ALBUTEROL 0.5-2.5 (3) MG/3ML IN SOLN
3.0000 mL | Freq: Once | RESPIRATORY_TRACT | Status: AC
  Administered 2018-04-09: 3 mL via RESPIRATORY_TRACT
  Filled 2018-04-09: qty 3

## 2018-04-09 MED ORDER — ATENOLOL 25 MG PO TABS
12.5000 mg | ORAL_TABLET | Freq: Every day | ORAL | Status: DC
  Administered 2018-04-09 – 2018-04-10 (×2): 12.5 mg via ORAL
  Filled 2018-04-09 (×3): qty 1

## 2018-04-09 MED ORDER — LORATADINE 10 MG PO TABS
10.0000 mg | ORAL_TABLET | Freq: Every day | ORAL | Status: DC
  Administered 2018-04-09: 10 mg via ORAL
  Filled 2018-04-09 (×3): qty 1

## 2018-04-09 MED ORDER — INSULIN ASPART 100 UNIT/ML ~~LOC~~ SOLN
0.0000 [IU] | Freq: Three times a day (TID) | SUBCUTANEOUS | Status: DC
  Administered 2018-04-09: 3 [IU] via SUBCUTANEOUS
  Administered 2018-04-10: 8 [IU] via SUBCUTANEOUS
  Administered 2018-04-10: 5 [IU] via SUBCUTANEOUS
  Administered 2018-04-10: 3 [IU] via SUBCUTANEOUS

## 2018-04-09 MED ORDER — ACETAMINOPHEN 650 MG RE SUPP: 650.0000 mg | Freq: Four times a day (QID) | RECTAL | Status: DC | PRN

## 2018-04-09 MED ORDER — GUAIFENESIN ER 600 MG PO TB12
600.0000 mg | ORAL_TABLET | Freq: Two times a day (BID) | ORAL | Status: DC
  Filled 2018-04-09 (×3): qty 1

## 2018-04-09 MED ORDER — KETOROLAC TROMETHAMINE 30 MG/ML IJ SOLN
30.0000 mg | Freq: Three times a day (TID) | INTRAMUSCULAR | Status: DC | PRN
  Administered 2018-04-09 – 2018-04-10 (×2): 30 mg via INTRAVENOUS
  Filled 2018-04-09 (×2): qty 1

## 2018-04-09 MED ORDER — SODIUM CHLORIDE 0.9 % IV SOLN
1.0000 g | Freq: Once | INTRAVENOUS | Status: AC
  Administered 2018-04-09: 1 g via INTRAVENOUS
  Filled 2018-04-09: qty 10

## 2018-04-09 MED ORDER — INSULIN GLARGINE 100 UNIT/ML ~~LOC~~ SOLN
50.0000 [IU] | Freq: Every day | SUBCUTANEOUS | Status: DC
  Administered 2018-04-09: 50 [IU] via SUBCUTANEOUS
  Filled 2018-04-09 (×3): qty 0.5

## 2018-04-09 MED ORDER — PANTOPRAZOLE SODIUM 40 MG PO TBEC
40.0000 mg | DELAYED_RELEASE_TABLET | Freq: Two times a day (BID) | ORAL | Status: DC
  Administered 2018-04-09 – 2018-04-10 (×3): 40 mg via ORAL
  Filled 2018-04-09 (×3): qty 1

## 2018-04-09 MED ORDER — HYDROXYZINE HCL 10 MG PO TABS
10.0000 mg | ORAL_TABLET | Freq: Three times a day (TID) | ORAL | Status: DC | PRN
  Filled 2018-04-09: qty 1

## 2018-04-09 MED ORDER — PREDNISONE 5 MG PO TABS: 5.0000 mg | ORAL_TABLET | Freq: Every day | ORAL | Status: DC

## 2018-04-09 MED ORDER — KETOROLAC TROMETHAMINE 30 MG/ML IJ SOLN
30.0000 mg | Freq: Once | INTRAMUSCULAR | Status: AC
  Administered 2018-04-09: 30 mg via INTRAVENOUS
  Filled 2018-04-09: qty 1

## 2018-04-09 MED ORDER — INSULIN ASPART 100 UNIT/ML ~~LOC~~ SOLN
0.0000 [IU] | Freq: Every day | SUBCUTANEOUS | Status: DC
  Administered 2018-04-09: 3 [IU] via SUBCUTANEOUS

## 2018-04-09 MED ORDER — ENSURE ENLIVE PO LIQD
237.0000 mL | Freq: Two times a day (BID) | ORAL | Status: DC
  Administered 2018-04-10: 237 mL via ORAL

## 2018-04-09 MED ORDER — GABAPENTIN 300 MG PO CAPS
600.0000 mg | ORAL_CAPSULE | Freq: Every day | ORAL | Status: DC
  Administered 2018-04-09: 600 mg via ORAL
  Filled 2018-04-09: qty 2

## 2018-04-09 MED ORDER — MONTELUKAST SODIUM 10 MG PO TABS
10.0000 mg | ORAL_TABLET | Freq: Every day | ORAL | Status: DC
  Administered 2018-04-09 – 2018-04-10 (×2): 10 mg via ORAL
  Filled 2018-04-09 (×2): qty 1

## 2018-04-09 MED ORDER — ALBUTEROL SULFATE (2.5 MG/3ML) 0.083% IN NEBU: 2.5000 mg | INHALATION_SOLUTION | Freq: Four times a day (QID) | RESPIRATORY_TRACT | Status: DC | PRN

## 2018-04-09 MED ORDER — LEVOFLOXACIN IN D5W 750 MG/150ML IV SOLN: 750.0000 mg | INTRAVENOUS | Status: DC

## 2018-04-09 NOTE — ED Provider Notes (Signed)
Received patient at signout from PA lawyer.  Refer to provider note for full history and physical examination.  Briefly, patient is a 32 year old female with history of morbid obesity, osteoarthritis, diabetes mellitus, PTSD, hypertension, anxiety, and asthma.  She presented with complaint of possible chicken bone stuck in her throat.  On CT soft tissue neck there was evidence of multifocal pneumonia and the patient was hypoxic in the ED with saturations of 88% on room air with improvement on nasal cannula.  She is apparently been experiencing hypoxia for the past month or so with a nonproductive cough.  Awaiting lab work. Plan to admit d/t hypoxia and multifocal PNE.  Physical Exam  BP (!) 159/93   Pulse 93   Temp 98.3 F (36.8 C) (Oral)   Resp 16   SpO2 93%   Physical Exam  Constitutional: She appears well-developed and well-nourished. No distress.  HENT:  Head: Normocephalic and atraumatic.  Eyes: Conjunctivae are normal. Right eye exhibits no discharge. Left eye exhibits no discharge.  Neck: No JVD present. No tracheal deviation present.  Cardiovascular: Normal rate, regular rhythm and normal heart sounds.  2+ pitting edema of the bilateral lower extremities.  Homans sign absent bilaterally.  2+ radial and DP/PT pulses bilaterally  Pulmonary/Chest: Effort normal.  Globally diminished breath sounds.  Equal rise and fall of chest.  On 2 L via nasal cannula   Abdominal: Soft. Bowel sounds are normal. She exhibits no distension. There is no tenderness.  Musculoskeletal: She exhibits no edema.  Neurological: She is alert.  Skin: Skin is warm and dry. No erythema.  Psychiatric: She has a normal mood and affect. Her behavior is normal.  Nursing note and vitals reviewed.   MDM  Lab work reviewed by me shows leukocytosis, hyperglycemia.  BNP within normal limits.  Spoke with Dr.Molt with internal medicine teaching service who agrees to assume care of patient and bring her into the hospital for  further evaluation and management.       Jeanie Sewer, PA-C 04/09/18 1107    Palumbo, April, MD 04/12/18 0008

## 2018-04-09 NOTE — Progress Notes (Signed)
Pt declined a lab draw due to her fear of needles. No MD contact number on file to contact at present.

## 2018-04-09 NOTE — ED Notes (Signed)
Pt c/o pain above iv site. Fluids decraesed to 125/h

## 2018-04-09 NOTE — ED Notes (Signed)
Pt returns from ct  

## 2018-04-09 NOTE — ED Notes (Signed)
Pt to u/s

## 2018-04-09 NOTE — ED Notes (Signed)
Attempted to call report

## 2018-04-09 NOTE — Progress Notes (Signed)
Several pages sent to MD but no calls received

## 2018-04-09 NOTE — ED Notes (Signed)
O2 sat noted to drop to 88%.  Placed on 2L [place].  At this time.  Reports having sleep apnea.

## 2018-04-09 NOTE — H&P (Signed)
Date: 04/09/2018               Patient Name:  Courtney Zamora MRN: [id number]  DOB: 1986/03/20 Age / Sex: 32 y.o., female   PCP: [facility]         Medical Service: Internal Medicine Teaching Service         Attending Physician: Dr. Oswaldo Done, Marquita Palms, *    First Contact: Dr. Chesley Mires Pager: [phone]  Second Contact: Dr. Crista Elliot Pager: [phone]       After Hours (After 5p/  First Contact Pager: [phone]  weekends / holidays): Second Contact Pager: [phone]   Chief Complaint: chicken bone in throat, increased dyspnea for several months  History of Present Illness:   32 year-old female with rheumatoid arthritis (diagnosed 10/2017: Enbrel Q-weekly, Prednisone 5 daily), morbid obesity, OSA not on CPAP, chronic GERD with ?esophageal polyps, history of childhood asthma, chronic BL LE edema, chronic pain syndrome, anxiety/PTSD and history of recurrent "URI's, sinus infections & PNA" who presented to the ED for evaluation sensation that a chicken bone is stuck in her throat. Symptoms started yesterday evening while having dinner and describes a sharp pain and feels it's lodged diagonally midway down her throat. Her father suggested trying water, bread, soda, milk and inducing emesis without change in symptoms. She is able to tolerate PO, denies any hemoptysis and hasn't noticed any acute change in breathing overnight but decided to come in to see if it needed to be removed.   She also complains of increased dyspnea and leg swelling over the past month despite increasing her home dose of lasix and also feels her UOP has declined as well. Admits to several medication changes including starting Embrel approximately 78-month ago. She also reports persistent and progressive dyspnea, cough that's occasionally productive of thick-green sputum and recurrent sinus infections and/or pneumonias since November 2018 after experiencing a presumed viral URI. She was treated with Amoxicillin then  doxycycline without improvement. In early January, she was treated with Azithromycin followed by Levaquin with a prednisone taper for pneumonia with slight improvement. Since that time, shes completed multiple courses of antibiotics and steroids without overall improvement and feels 'debilitated' by her dyspnea. Overall, she feels her breathing improves most when she receives steroids. In addition to the above, she admits to recurrent epistaxis since January, persistent chest congestion, recurrent LE ulcerations, and intermittent ear-aches. She is currently on Bactrim (day 6 of 7) to 'prevent infection' of her lower extremity wounds prescribed by her pain management MD.   In the ED, temp 98.7*, pulse 102, BP 152/112, respirations 18 and she was saturating 96% (although O2 sat noted 88% on RA in ED triage note). Plain film and CT soft-tissue of neck without radiopaque foreign body but central patchy consolidations were appreciated BL, felt to represent pulmonary edema or multifocal pneumonia. CMET grossly wnl. BNP 23. CBC with WBC 11.7. She was given duonebs, IV CTX and Azithromycin and IMTS was contacted for admission for multifocal pneumonia.   Meds:  Current Meds  Medication Sig  . albuterol (PROVENTIL HFA;VENTOLIN HFA) 108 (90 Base) MCG/ACT inhaler Inhale 2 puffs into the lungs every 6 (six) hours as needed for wheezing or shortness of breath (wheezing). For shortness of breath.  Marland Kitchen albuterol (PROVENTIL) (2.5 MG/3ML) 0.083% nebulizer solution Take 3 mLs (2.5 mg total) by nebulization every 6 (six) hours as needed for wheezing or shortness of breath.  Marland Kitchen atenolol (TENORMIN) 25 MG tablet Take 0.5 tablets (12.5 mg total)  by mouth daily.  Marland Kitchen BELBUCA 300 MCG FILM Place 300 mcg under the tongue 2 (two) times daily.  . Biotin w/ Vitamins C & E (HAIR/SKIN/NAILS PO) Take 1 tablet by mouth daily.  . Calcium Citrate 200 MG TABS Take 2 tablets (400 mg total) by mouth every other day. (Patient taking differently:  Take 200 mg by mouth daily. )  . cetirizine (ZYRTEC) 10 MG tablet TAKE 1 TABLET BY MOUTH EVERY NIGHT AT BEDTIME (Patient taking differently: Take 10 mg by mouth at bedtime. )  . DULoxetine (CYMBALTA) 30 MG capsule TAKE 1 CAPSULE BY MOUTH TWICE DAILY (Patient taking differently: Take 30 mg by mouth 2 (two) times daily. )  . etanercept (ENBREL SURECLICK) 50 MG/ML injection Inject 50 mg as directed once a week.   . furosemide (LASIX) 20 MG tablet TAKE 1 TABLET BY MOUTH EVERY OTHER DAY (Patient taking differently: Take 20 mg by mouth every other day. )  . gabapentin (NEURONTIN) 300 MG capsule TAKE 1 CAPSULE BY MOUTH EVERY MORNING, 1 CAPSULE BY MOUTH AT NOON, AND 2 CAPSULES BY MOUTH AT BEDTIME (Patient taking differently: Take 300-600 mg by mouth See admin instructions. TAKE 1 CAPSULE BY MOUTH EVERY MORNING, 1 CAPSULE BY MOUTH AT NOON, AND 2 CAPSULES BY MOUTH AT BEDTIME)  . hydrochlorothiazide (HYDRODIURIL) 25 MG tablet Take 1 tablet (25 mg total) by mouth daily.  . hydrOXYzine (ATARAX/VISTARIL) 10 MG tablet Take 1 tablet (10 mg total) by mouth 3 (three) times daily as needed.  . insulin aspart (NOVOLOG) 100 UNIT/ML injection Inject 15 units in a.m with BF, 17 unis with lunch and 15 unis Subcut with dinner (Patient taking differently: Inject 15-17 Units into the skin See admin instructions. Use 15 units with breakfast then use 17 units at lunch then use 15 units with dinner)  . insulin glargine (LANTUS) 100 UNIT/ML injection Inject 0.52 mLs (52 Units total) into the skin at bedtime.  Marland Kitchen ipratropium-albuterol (DUONEB) 0.5-2.5 (3) MG/3ML SOLN Take 3 mLs by nebulization every 6 (six) hours as needed. (Patient taking differently: Take 3 mLs by nebulization every 4 (four) hours as needed (for shortness of breath or wheezing). )  . liraglutide (VICTOZA) 18 MG/3ML SOPN Inject 0.2 mLs (1.2 mg total) into the skin daily.  . methotrexate (RHEUMATREX) 2.5 MG tablet Take 3 tablets (7.5 mg total) by mouth once a week.  Caution:Chemotherapy. Protect from light.  . montelukast (SINGULAIR) 10 MG tablet Take 1 tablet (10 mg total) by mouth daily.  . Multiple Vitamins-Minerals (WOMENS MULTI) CAPS Take 1 capsule by mouth daily.  . pantoprazole (PROTONIX) 40 MG tablet Take 1 tablet (40 mg total) by mouth 2 (two) times daily before a meal.  . predniSONE (DELTASONE) 5 MG tablet TAKE 1 TABLET BY MOUTH EVERY DAY WITH BREAKFAST (Patient taking differently: Take 5 mg by mouth daily with breakfast. )  . promethazine (PHENERGAN) 25 MG tablet Take 1 tablet (25 mg total) by mouth every 8 (eight) hours as needed for nausea or vomiting.  . sitaGLIPtin (JANUVIA) 50 MG tablet Take 1 tablet (50 mg total) by mouth daily.  Marland Kitchen sulfamethoxazole-trimethoprim (BACTRIM DS,SEPTRA DS) 800-160 MG tablet Take 1 tablet by mouth 2 (two) times daily.  Marland Kitchen tiZANidine (ZANAFLEX) 4 MG tablet Take 1 tablet (4 mg total) by mouth every 6 (six) hours as needed for muscle spasms.  . Vitamin D, Ergocalciferol, (DRISDOL) 50000 units CAPS capsule Take 50,000 Units by mouth once a week.   Allergies: Allergies as of 04/08/2018 - Review Complete  03/23/2018  Allergen Reaction Noted  . Bee venom Anaphylaxis 07/29/2013  . Cinnamon Anaphylaxis 09/16/2012  . Doxycycline Hives 07/19/2017  . Ibuprofen Other (See Comments) 11/12/2013  . Lexapro [escitalopram oxalate] Other (See Comments) 03/17/2017  . Tape Hives, Rash, and Other (See Comments) 10/02/2016   Past Medical History:  Diagnosis Date  . Anxiety   . Asthma   . Depression   . Diabetes mellitus without complication (HCC)   . Essential hypertension   . GERD (gastroesophageal reflux disease)   . Manic state (HCC)   . Obesity   . Osteoarthritis, knee   . PTSD (post-traumatic stress disorder)   . Seizures (HCC)   . Severe needle phobia    Family History: Mom with history of hypothyroidism. Dad with history of diabetes. MGF with HTN, pancreatic cancer.   Social History: She denies any prior tobacco  use or second-hand exposure. She is disabled; Former EMT/Firewoman for 10 years, quit 2014. Denied significant smoke inhalation. Menagerie of pets at home including snakes, lizards, dogs, ferrets and hedgehogs.  Review of Systems: A complete ROS was negative except as per HPI.   Physical Exam: Blood pressure 133/72, pulse 95, temperature 98.3 F (36.8 C), temperature source Oral, resp. rate 16, SpO2 96 %. General: Morbidly obese female in no acute distress. Dyspnea when ambulating back to bed from rest room. Anxious. Husband at bedside.  HENT: PERRL. EOMI. No conjunctival injection, icterus or ptosis. No erythema or edema of posterior oropharynx. No stridor. Several scattered scabs on face without purulence or significant surrounding erythema.  Cardiovascular: Tachycardic. No murmur or rub but limited by body habitus. JVD difficult to appreciate given body habitus.  Pulmonary: Grossly CTA. Mildly dyspneic when ambulating from bathroom. Speaks in complete sentences. Saturating well on RA.  Abdomen: Obese abdomen. Soft. Non-tender. No guarding or rigidity.   Extremities: 1+ pitting edema BL LE. Extremities are warm, perfused. Several scattered scabs and hyperpigmented areas without purulent discharge or significant surrounding erythema.  Skin: Warm, dry. No cyanosis.  Neuro: Sensation intact BL UE and LE. Strength grossly intact.  Psych: Mood anxious and affect was mood congruent. Responds to questions appropriately.   EKG: pending (patient reports history of prolonged QT but this was felt to be medication-related)  CT chest wo contrast 07/2017: Mild diffuse patchy areas of ground-glass attenuation scattered throughout lungs bilaterally. Non-specific. Dilation of pulmonary trunk (3.3 cm in diameter) concerning for pulmonary arterial hypertension. Severe hepatic steatosis. Multiple nodular appearing soft-tissue densities in distal 1/3rd of esophagus concerning for multiple small polyps.    CTA  08/2017: No PE. Mosaic attentuations with ground-glass opacities of lung parenchyma. Differential includes areas of hypoperfusion caused by pulmonary hypertension, small airway disease, infectious or inflammatory pneumonitis. Also showed severe hepatic steatosis.   CTA head/neck 01/2018: RUL infiltrate with stable diffuse mosaic pattern. Treated based on incidental finding with Augmentin and increased dose of Prednisone.    CRP 02/27/18 55 Quantiferon negative 02/27/18   Ref. Range 10/20/2017 11:23  Anti Nuclear Antibody(ANA) Latest Ref Range: Negative  Negative  Cyclic Citrullin Peptide Ab Latest Ref Range: 0 - 19 units >250 (H)  RA Latex Turbid. Latest Ref Range: 0.0 - 13.9 IU/mL >650.0 (H)   Assessment & Plan by Problem: Active Problems:   Acute respiratory failure with hypoxia (HCC)  Acute Respiratory Failure with Hypoxia Patient here with ongoing dyspnea on exertion and non-productive cough and frequent upper-respiratory infections/pneumonia since November, 2018. Work-up thus far significant for diffuse patchy ground-glass attenuations and pulmonary trunk dilation  suggesting pulmonary arterial hypertension. She was also diagnosed with rheumatoid arthritis during this time as well. PE felt unlikely given multiple negative CTA's during course of her work-up. She also has BL LE edema for which she takes lasix at home and noted a recent increase over the past month.   Taking into consideration her underlying rheumatologic disease, differential also includes ILD vs pneumonitis vs medication adverse effect. She was briefly on MTX although this was started after symptom onset and no change in respiratory status since discontinuing. She was started on Embrel approximately 108-month ago, which coincides with worsening LE edema and DOE despite increased lasix at home. Embrel can induced and exacerbate HF, although her most recent ECHO 07/2017 was wnl. She could have right-heart failure related to underlying  pulmonary disease vs pulmonary hypertension which would be supported by dilated pulmonic trunk to 3.3 cm seen on CT 07/2017 and untreated OSA. I suspect her presentation is multifactorial, but do think there is a strong rheumatologic component (RA-ILD, bronchiolitis, etc).   -S/p IV Lasix 40mg  x1 in ED; Consider PO Lasix 40mg  daily starting tomorrow -S/p IV Azithromycin and CTX in ED -In setting of severe hepatic steatosis and progressive dyspnea in this non-smoker, will order alpha-1-antitrypsin abs for further evaluation -ECHO pending; could consider RHC to evaluate for elevated pulmonary pressures -In setting of continued epistaxis with 1cm septal ulceration, have ordered ANCA titers -Levaquin 750 mg daily -Prednisone 40 mg daily  -Duonebs, ICS -Mucinex  Reported History of Asthma OSA Not on CPAP Reviewed PFTs and spirometry from outpatient pulmonology visits; patient effort was erratic but FEV1 and FVC were reduced (70%, 76% predicted respectively) with an elevated FEV1/FVC ratio. Airway resistance was normal and she had no significant response after administration of bronchodilators. Reviewed sleep study and CPAP titration from 09/2017 which indicates need for CPAP. Patient reports she only recently got insurance and can now pursue CPAP. Current outpatient regimen includes prn duonebs and albuterol which have been unhelpful for her dyspnea.  -CPAP QHS -Unsure if sleep study from 09/2017 will qualify for outpt CPAP, but this can be addressed by PCP  DM2 Home regimen includes Lantus 52 units, Novolog 15 units with breakfast, 17 units with lunch, 15 units with dinner, Sitagliptin 50mg  daily, Liraglutide 1.2mg  daily.   Erosive Seropositive Rheumatoid Arthritis Diagnosed 10/2017, first visit with rheumatology 02/2018. Positive RF and AntiCCP ab's with evidence for synovitis, erosions and joint tenderness. Has tried MTX and plaquenil and recently   Severe Hepatic Steatosis  Recent lipid panel  with total cholesterol 164, LDL 84, HDL 58 and TG 109. She is not on a statin. Hepatitis C ab negative. Hepatitis B core Ab positive but Surface Ag negative and no HBV DNA was detected earlier this year.  Recurrent left-sided epistaxis Symptoms started at beginning of year with remainder of syx. Saw Dr. 03/02/18 who noted 1cm ulcerated area along left nasal septum. Conservative management with saline spray and Afrin prn nosebleeds.  -ANCA panel   BL LE wounds Patient currently on Day 6/7 of Bactrim which was reportedly prescribed by her pain management MD to 'prevent' infection of her LE wounds. She notes a long history of recurrent LE lesions. Exam shows scattered hyperpigmented lesions and several wounds with skin break but no purulent drainage or significant surrounding erythema. She also has similar lesions on her face.   Hypertension Palpitations Patient saw outpatient cardiology recently for sinus tachycardia and palpitations. Symptoms improved with initiation of Atenolol. Patient was to have 14-day  cardiac monitor but not yet completed. Denied any chest pain or palpitations today.  -Continue home HCTZ and Atenolol -Consider advancing regimen for tighter BP control but she was apprehensive about any medication changes today  ?Esophageal Polyps GERD ?Foreign body Evaluation here without evidence for foreign body. Numerous nodular-appearing areas of soft-tissue density seen in distal 1/3rd of esophagus concerning for polyps seen on CT 07/2017. Was scheduled for EGD 01/2018 but canceled due to lack of transportation. Chart review shows outpatient GI note 09/19/17 with mention of dysphagia and occasional pain/sensation that food/medications get stuck.  It is possible that the sensation she is experiencing is related to esophageal polyps. She is without stridor, is managing her secretions and able to tolerate PO.  -Continue BID PPI -No outpt GI appointment scheduled, would benefit from  continued outpatient work-up  Chronic Pain Patient follows with pain management and takes Buprenorphine 300 mcg BID and Gabapentin 300 mg QAM and 600mg  QPM. This regimen was ordered on admission however patient requests Toradol as this helped her pain in the ED.   Needle Phobia Anxiety Disorder PTSD Patient states she has "seizures" when labs are drawn. No seizure like activity this morning during lab draws. Will attempt to reduce the number of blood draws as able. Continue home Duloxetine and hydroxyzine.   Diet: HH/carb mod DVT ppx: Lovenox IVF: None Code: FULL  Dispo: Admit patient to Inpatient with expected length of stay greater than 2 midnights.  SignedNoemi Chapel, DO 04/09/2018, 10:21 AM  Pager: [phone]

## 2018-04-09 NOTE — ED Notes (Signed)
Pt to ct 

## 2018-04-09 NOTE — ED Notes (Signed)
Pt declines lasix at this time while waiting for room.

## 2018-04-09 NOTE — ED Provider Notes (Signed)
[facility] EMERGENCY DEPARTMENT Provider Note   CSN: [id number] Arrival date & time: 04/08/18  2022     History   Chief Complaint Chief Complaint  Patient presents with  . chicken bone stuck in throat    HPI Courtney Zamora is a 32 y.o. female.  HPI Patient presents to the emergency department with a possible chicken bone stuck in her throat.  The patient states that she was eating chicken at a restaurant when she felt the bone go down to her throat.  The patient states that nothing seems to make the condition better or worse.  Patient states that he was able to keep fluids down without difficulty.  The patient denies chest pain, shortness of breath, headache,blurred vision, neck pain, fever,weakness, numbness, dizziness, anorexia, edema, abdominal pain, nausea, vomiting, diarrhea, rash, back pain, dysuria, hematemesis, bloody stool, near syncope, or syncope.  Patient has noted low oxygen saturations here in the emergency department.  She then is further questioned and states that she has been having low oxygen saturations at her doctor's office over the last month.  She states she is also been having cough with this. Past Medical History:  Diagnosis Date  . Anxiety   . Asthma   . Depression   . Diabetes mellitus without complication ([place])   . Essential hypertension   . GERD (gastroesophageal reflux disease)   . Manic state ([place])   . Obesity   . Osteoarthritis, knee   . PTSD (post-traumatic stress disorder)   . Seizures ([place])   . Severe needle phobia     Patient Active Problem List   Diagnosis Date Noted  . DUB (dysfunctional uterine bleeding) 03/02/2018  . Abnormal Pap smear of cervix 03/02/2018  . Abnormal CT scan, esophagus 09/19/2017  . Dysphagia 09/19/2017  . Elevated LFTs 09/19/2017  . Newly diagnosed diabetes ([place]) 09/15/2017  . Hepatic steatosis 09/15/2017  . DM2 (diabetes mellitus, type 2) ([place]) 09/06/2017  . SOB (shortness of breath)  09/06/2017  . CAP (community acquired pneumonia) 08/03/2017  . Acute frontal sinusitis 12/15/2016  . Metatarsal stress fracture, right, sequela 11/17/2016  . Neck muscle spasm 10/05/2016  . Nipple discharge in female 09/13/2016  . Depression 08/17/2016  . Anxiety 08/17/2016  . ASCUS with positive high risk HPV cervical 08/14/2016  . Hypertension 08/14/2016  . Long term (current) use of systemic steroids 08/14/2016  . Esophageal reflux 12/29/2015  . Morbid (severe) obesity due to excess calories ([place]) 12/29/2015  . Chronic pain of right knee 12/29/2015  . Chronic pain of left ankle 12/29/2015  . 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 Asthma, moderate persistent 12/09/2015    Past Surgical History:  Procedure Laterality Date  . TONSILLECTOMY       OB History    Gravida  1   Para  0   Term  0   Preterm  0   AB  1   Living  0     SAB  1   TAB  0   Ectopic  0   Multiple  0   Live Births  0            Home Medications    Prior to Admission medications   Medication Sig Start Date End Date Taking? Authorizing Provider  albuterol (PROVENTIL HFA;VENTOLIN HFA) 108 (90 Base) MCG/ACT inhaler Inhale 2 puffs into the lungs every 6 (six) hours as needed for wheezing or shortness of breath (wheezing). For shortness of breath. 01/05/18  Yes  Ladell Pier, MD  albuterol (PROVENTIL) (2.5 MG/3ML) 0.083% nebulizer solution Take 3 mLs (2.5 mg total) by nebulization every 6 (six) hours as needed for wheezing or shortness of breath. 11/17/16  Yes Funches, Josalyn, MD  atenolol (TENORMIN) 25 MG tablet Take 0.5 tablets (12.5 mg total) by mouth daily. 02/12/18  Yes Ladell Pier, MD  BELBUCA 300 MCG FILM Place 300 mcg under the tongue 2 (two) times daily. 03/31/18  Yes [provider]  Biotin w/ Vitamins C & E (HAIR/SKIN/NAILS PO) Take 1 tablet by mouth daily.   Yes [provider]  Calcium Citrate 200 MG TABS Take 2 tablets (400 mg total) by mouth every other  day. Patient taking differently: Take 200 mg by mouth daily.  10/05/16  Yes Rowe Clack, MD  cetirizine (ZYRTEC) 10 MG tablet TAKE 1 TABLET BY MOUTH EVERY NIGHT AT BEDTIME Patient taking differently: Take 10 mg by mouth at bedtime.  03/02/18  Yes Ladell Pier, MD  DULoxetine (CYMBALTA) 30 MG capsule TAKE 1 CAPSULE BY MOUTH TWICE DAILY Patient taking differently: Take 30 mg by mouth 2 (two) times daily.  03/05/18  Yes Ladell Pier, MD  etanercept (ENBREL SURECLICK) 50 MG/ML injection Inject 50 mg as directed once a week.  03/06/18  Yes [provider]  furosemide (LASIX) 20 MG tablet TAKE 1 TABLET BY MOUTH EVERY OTHER DAY Patient taking differently: Take 20 mg by mouth every other day.  03/30/18  Yes Ladell Pier, MD  gabapentin (NEURONTIN) 300 MG capsule TAKE 1 CAPSULE BY MOUTH EVERY MORNING, 1 CAPSULE BY MOUTH AT NOON, AND 2 CAPSULES BY MOUTH AT BEDTIME Patient taking differently: Take 300-600 mg by mouth See admin instructions. TAKE 1 CAPSULE BY MOUTH EVERY MORNING, 1 CAPSULE BY MOUTH AT NOON, AND 2 CAPSULES BY MOUTH AT BEDTIME 03/05/18  Yes Ladell Pier, MD  hydrochlorothiazide (HYDRODIURIL) 25 MG tablet Take 1 tablet (25 mg total) by mouth daily. 10/02/17  Yes Ladell Pier, MD  hydrOXYzine (ATARAX/VISTARIL) 10 MG tablet Take 1 tablet (10 mg total) by mouth 3 (three) times daily as needed. 02/14/18  Yes Ladell Pier, MD  insulin aspart (NOVOLOG) 100 UNIT/ML injection Inject 15 units in a.m with BF, 17 unis with lunch and 15 unis Subcut with dinner Patient taking differently: Inject 15-17 Units into the skin See admin instructions. Use 15 units with breakfast then use 17 units at lunch then use 15 units with dinner 02/12/18  Yes Ladell Pier, MD  insulin glargine (LANTUS) 100 UNIT/ML injection Inject 0.52 mLs (52 Units total) into the skin at bedtime. 01/05/18  Yes Ladell Pier, MD  ipratropium-albuterol (DUONEB) 0.5-2.5 (3) MG/3ML SOLN Take 3  mLs by nebulization every 6 (six) hours as needed. Patient taking differently: Take 3 mLs by nebulization every 4 (four) hours as needed (for shortness of breath or wheezing).  07/28/17  Yes Mannam, Praveen, MD  liraglutide (VICTOZA) 18 MG/3ML SOPN Inject 0.2 mLs (1.2 mg total) into the skin daily. 12/05/17  Yes Ladell Pier, MD  methotrexate (RHEUMATREX) 2.5 MG tablet Take 3 tablets (7.5 mg total) by mouth once a week. Caution:Chemotherapy. Protect from light. 02/12/18  Yes Ladell Pier, MD  montelukast (SINGULAIR) 10 MG tablet Take 1 tablet (10 mg total) by mouth daily. 02/15/18 02/15/19 Yes Ladell Pier, MD  Multiple Vitamins-Minerals (WOMENS MULTI) CAPS Take 1 capsule by mouth daily.   Yes [provider]  pantoprazole (PROTONIX) 40 MG tablet Take  1 tablet (40 mg total) by mouth 2 (two) times daily before a meal. 04/03/18  Yes Zehr, Janett Billow D, PA-C  predniSONE (DELTASONE) 5 MG tablet TAKE 1 TABLET BY MOUTH EVERY DAY WITH BREAKFAST Patient taking differently: Take 5 mg by mouth daily with breakfast.  03/02/18  Yes Ladell Pier, MD  promethazine (PHENERGAN) 25 MG tablet Take 1 tablet (25 mg total) by mouth every 8 (eight) hours as needed for nausea or vomiting. 12/08/17  Yes Ladell Pier, MD  sitaGLIPtin (JANUVIA) 50 MG tablet Take 1 tablet (50 mg total) by mouth daily. 02/23/18  Yes Ladell Pier, MD  sulfamethoxazole-trimethoprim (BACTRIM DS,SEPTRA DS) 800-160 MG tablet Take 1 tablet by mouth 2 (two) times daily. 04/02/18  Yes [provider]  tiZANidine (ZANAFLEX) 4 MG tablet Take 1 tablet (4 mg total) by mouth every 6 (six) hours as needed for muscle spasms. 02/12/18  Yes Ladell Pier, MD  Vitamin D, Ergocalciferol, (DRISDOL) 50000 units CAPS capsule Take 50,000 Units by mouth once a week. 03/26/18  Yes [provider]  ACCU-CHEK SOFTCLIX LANCETS lancets UAD UP TO TID TO CHECK BLOOD SUGAR 03/06/18   Ladell Pier, MD  acetaminophen-codeine  (TYLENOL #3) 300-30 MG tablet TAKE 1 TABLET BY MOUTH EVERY 6 HOURS AS NEEDED FOR MODERATE PAIN Patient not taking: Reported on 04/09/2018 02/15/18   Ladell Pier, MD  Blood Glucose Monitoring Suppl (ACCU-CHEK AVIVA PLUS) w/Device KIT UAD UP TO TID TO TEST BLOOD SUGAR 03/06/18   Ladell Pier, MD  folic acid (FOLVITE) 1 MG tablet Take 1 tablet (1 mg total) by mouth daily. Patient not taking: Reported on 03/02/2018 02/15/18   Ladell Pier, MD  glucose blood (ACCU-CHEK AVIVA PLUS) test strip UAD UP TO TID TO CHECK BLOOD SUGAR 03/06/18   Ladell Pier, MD  Insulin Pen Needle (PEN NEEDLES) 31G X 8 MM MISC Use as directed 10/20/17   Ladell Pier, MD  Respiratory Therapy Supplies (FLUTTER) DEVI As directed. 07/28/17   Marshell Garfinkel, MD  Spacer/Aero-Holding Chambers (AEROCHAMBER MV) inhaler Use as instructed 01/05/18   Ladell Pier, MD    Family History Family History  Problem Relation Age of Onset  . Hypothyroidism Mother   . Diabetes Father   . Diabetes Paternal Uncle   . Hypertension Maternal Grandmother   . Hypertension Maternal Grandfather   . Pancreatic cancer Maternal Grandfather   . Hypertension Paternal Grandmother   . Heart disease Paternal Grandfather   . Hypertension Paternal Grandfather     Social History Social History   Tobacco Use  . Smoking status: Never Smoker  . Smokeless tobacco: Never Used  Substance Use Topics  . Alcohol use: No  . Drug use: No     Allergies   Bee venom; Cinnamon; Doxycycline; Ibuprofen; Lexapro [escitalopram oxalate]; and Tape   Review of Systems Review of Systems All other systems negative except as documented in the HPI. All pertinent positives and negatives as reviewed in the HPI.  Physical Exam Updated Vital Signs BP (!) 159/93   Pulse 93   Temp 98.3 F (36.8 C) (Oral)   Resp 16   SpO2 93%   Physical Exam  Constitutional: She is oriented to person, place, and time. She appears well-developed and  well-nourished. No distress.  HENT:  Head: Normocephalic and atraumatic.  Mouth/Throat: Oropharynx is clear and moist.  Eyes: Pupils are equal, round, and reactive to light.  Neck: Normal range of motion. Neck supple.  Cardiovascular:  Normal rate, regular rhythm and normal heart sounds. Exam reveals no gallop and no friction rub.  No murmur heard. Pulmonary/Chest: Effort normal and breath sounds normal. No respiratory distress. She has no wheezes.  Abdominal: Soft. Bowel sounds are normal. She exhibits no distension. There is no tenderness.  Neurological: She is alert and oriented to person, place, and time. She exhibits normal muscle tone. Coordination normal.  Skin: Skin is warm and dry. Capillary refill takes less than 2 seconds. No rash noted. No erythema.  Psychiatric: She has a normal mood and affect. Her behavior is normal.  Nursing note and vitals reviewed.    ED Treatments / Results  Labs (all labs ordered are listed, but only abnormal results are displayed) Labs Reviewed  CBC WITH DIFFERENTIAL/PLATELET  BASIC METABOLIC PANEL  BRAIN NATRIURETIC PEPTIDE  POC URINE PREG, ED    EKG None  Radiology Dg Neck Soft Tissue  Result Date: 04/08/2018 CLINICAL DATA:  32 year old female with chicken bone stuck in the throat. EXAM: NECK SOFT TISSUES - 1+ VIEW COMPARISON:  None. FINDINGS: There is no evidence of retropharyngeal soft tissue swelling or epiglottic enlargement. The cervical airway is unremarkable and no radio-opaque foreign body identified. IMPRESSION: No radiopaque foreign object. Electronically Signed   By: Anner Crete M.D.   On: 04/08/2018 21:42   Ct Soft Tissue Neck Wo Contrast  Result Date: 04/09/2018 CLINICAL DATA:  32 y/o F; swallowed a bone and feels it in her throat. EXAM: CT NECK WITHOUT CONTRAST TECHNIQUE: Multidetector CT imaging of the neck was performed following the standard protocol without intravenous contrast. COMPARISON:  01/29/2018 CT angiogram of  the head and neck. FINDINGS: Pharynx and larynx: Normal. No mass or swelling. No radiopaque foreign body identified. Salivary glands: No inflammation, mass, or stone. Thyroid: Normal. Lymph nodes: None enlarged or abnormal density. Vascular: Negative. Limited intracranial: Negative. Visualized orbits: Negative. Mastoids and visualized paranasal sinuses: Clear. Skeleton: No acute or aggressive process. Upper chest: Central patchy consolidations within the lungs may reflect pneumonia or pulmonary edema. Other: None. IMPRESSION: 1. No radiopaque foreign body identified. 2. Central patchy consolidations of the lungs may represent pulmonary edema or multifocal pneumonia. Electronically Signed   By: Kristine Garbe M.D.   On: 04/09/2018 05:26    Procedures Procedures (including critical care time)  Medications Ordered in ED Medications  azithromycin (ZITHROMAX) 500 mg in sodium chloride 0.9 % 250 mL IVPB (has no administration in time range)  cefTRIAXone (ROCEPHIN) 1 g in sodium chloride 0.9 % 100 mL IVPB (1 g Intravenous New Bag/Given 04/09/18 0612)  gi cocktail (Maalox,Lidocaine,Donnatal) (30 mLs Oral Given 04/09/18 0418)     Initial Impression / Assessment and Plan / ED Course  I have reviewed the triage vital signs and the nursing notes.  Pertinent labs & imaging results that were available during my care of the patient were reviewed by me and considered in my medical decision making (see chart for details).    The patient will most likely need admission for oxygen requirement due to the fact that she is on 2 L nasal cannula to maintain her oxygen saturations above 88%.  Patient is diabetic and taking Embrel.  Patient has no known foreign body on the CT scan but they do note bilateral patchy infiltrate which could be consistent with edema versus infection.  Final Clinical Impressions(s) / ED Diagnoses   Final diagnoses:  None    ED Discharge Orders    None       Dione Mccombie,  Harrell Gave, PA-C 04/09/18 [place], April, MD 04/09/18 [phone]

## 2018-04-09 NOTE — Progress Notes (Addendum)
Echocardiogram 2D Echocardiogram with Definity has been performed.  04/09/2018 2:23 PM Gertie Fey, MHA, RVT, RDCS, RDMS

## 2018-04-09 NOTE — Progress Notes (Signed)
Pt does not seem to have an assigned admitting MD with a pager number at this time. Number on file not getting any responses

## 2018-04-10 DIAGNOSIS — Y9301 Activity, walking, marching and hiking: Secondary | ICD-10-CM

## 2018-04-10 DIAGNOSIS — R918 Other nonspecific abnormal finding of lung field: Secondary | ICD-10-CM

## 2018-04-10 DIAGNOSIS — R9389 Abnormal findings on diagnostic imaging of other specified body structures: Secondary | ICD-10-CM | POA: Diagnosis present

## 2018-04-10 DIAGNOSIS — S93401A Sprain of unspecified ligament of right ankle, initial encounter: Secondary | ICD-10-CM

## 2018-04-10 DIAGNOSIS — G8929 Other chronic pain: Secondary | ICD-10-CM

## 2018-04-10 DIAGNOSIS — R0989 Other specified symptoms and signs involving the circulatory and respiratory systems: Secondary | ICD-10-CM

## 2018-04-10 LAB — COMPREHENSIVE METABOLIC PANEL
ALT: 46 U/L — ABNORMAL HIGH (ref 0–44)
AST: 37 U/L (ref 15–41)
Albumin: 2.8 g/dL — ABNORMAL LOW (ref 3.5–5.0)
Alkaline Phosphatase: 63 U/L (ref 38–126)
Anion gap: 12 (ref 5–15)
BILIRUBIN TOTAL: 0.6 mg/dL (ref 0.3–1.2)
BUN: 14 mg/dL (ref 6–20)
CALCIUM: 8.9 mg/dL (ref 8.9–10.3)
CO2: 25 mmol/L (ref 22–32)
Chloride: 102 mmol/L (ref 98–111)
Creatinine, Ser: 0.67 mg/dL (ref 0.44–1.00)
GFR calc Af Amer: >60 mL/min (ref >60)
Glucose, Bld: 214 mg/dL — ABNORMAL HIGH (ref 70–99)
POTASSIUM: 4.3 mmol/L (ref 3.5–5.1)
Sodium: 139 mmol/L (ref 135–145)
TOTAL PROTEIN: 6.7 g/dL (ref 6.5–8.1)

## 2018-04-10 LAB — CBC
HEMATOCRIT: 40.9 % (ref 36.0–46.0)
Hemoglobin: 11.8 g/dL — ABNORMAL LOW (ref 12.0–15.0)
MCH: 25.5 pg — ABNORMAL LOW (ref 26.0–34.0)
MCHC: 28.9 g/dL — AB (ref 30.0–36.0)
MCV: 88.5 fL (ref 78.0–100.0)
Platelets: 317 10*3/uL (ref 150–400)
RBC: 4.62 MIL/uL (ref 3.87–5.11)
RDW: 16.4 % — AB (ref 11.5–15.5)
WBC: 8.5 10*3/uL (ref 4.0–10.5)

## 2018-04-10 LAB — GLUCOSE, CAPILLARY
GLUCOSE-CAPILLARY: 184 mg/dL — AB (ref 70–99)
GLUCOSE-CAPILLARY: 229 mg/dL — AB (ref 70–99)
Glucose-Capillary: 259 mg/dL — ABNORMAL HIGH (ref 70–99)

## 2018-04-10 MED ORDER — ADULT MULTIVITAMIN W/MINERALS CH
1.0000 | ORAL_TABLET | Freq: Every day | ORAL | Status: DC
  Administered 2018-04-10: 1 via ORAL
  Filled 2018-04-10: qty 1

## 2018-04-10 MED ORDER — PREDNISONE 5 MG PO TABS: 5.0000 mg | ORAL_TABLET | Freq: Every day | ORAL | Status: DC

## 2018-04-10 MED ORDER — GLUCERNA SHAKE PO LIQD
237.0000 mL | Freq: Three times a day (TID) | ORAL | Status: DC
  Administered 2018-04-10: 237 mL via ORAL

## 2018-04-10 NOTE — Progress Notes (Signed)
PT Note:  SATURATION QUALIFICATIONS: (This note is used to comply with regulatory documentation for home oxygen)  Patient Saturations on Room Air at Rest = 93%  Patient Saturations on Room Air while Ambulating = 85%  Patient Saturations on 2 Liters of oxygen while Ambulating = 90%  Please briefly explain why patient needs home oxygen: Though pt's O2 sats are remaining in the low 90's on RA within the room when she ambulates household distances, >50', she is dropping to 85% with >3 mins to recover at rest.   [name], PT  Acute Rehab Services  Pager [phone] Office [phone]

## 2018-04-10 NOTE — Evaluation (Signed)
Physical Therapy Evaluation Patient Details Name: Courtney Zamora MRN: [id number] DOB: 1985-07-26 Today's Date: 04/10/2018   History of Present Illness  32 year old woman admitted for observation of new infiltrates seen incidentally on CT of her chest. She has rheumatoid arthritis previously treated with methotrexate  Clinical Impression  Pt admitted with above diagnosis. Pt currently with functional limitations due to the deficits listed below (see PT Problem List). Pt able to ambulate within room with O2 sats stable but once she goes further than 50', O2 sats dropped to 85% and HR up to 145 bpm. At 100', pt's LE's began to shake and pt needed to sit urgently. Chair brought to pt and returned to room. On 2L O2, sats returned to 94% and HR dropped to 88 bpm after 5 mins rest with pursed lip breathing. Pt has been using her father in law's rollator but it is not bariatric and is unsafe for pt to sit on, she really needs a bari rollator to allow her to have a place to sit for safety and to work on increasing exercise tolerance safely.  Pt will benefit from skilled PT to increase their independence and safety with mobility to allow discharge to the venue listed below.       Follow Up Recommendations Home health PT    Equipment Recommendations  Other (comment);Wheelchair (measurements PT)(bariatric rollator)    Recommendations for Other Services       Precautions / Restrictions Precautions Precautions: Other (comment) Precaution Comments: watch O2 sats Restrictions Weight Bearing Restrictions: No      Mobility  Bed Mobility Overal bed mobility: Modified Independent                Transfers Overall transfer level: Needs assistance Equipment used: Rolling walker (2 wheeled) Transfers: Sit to/from [employer] Sit to Stand: Min assist Stand pivot transfers: Min guard       General transfer comment: min A for safety as pt uses momentum to stand from  bed  Ambulation/Gait Ambulation/Gait assistance: Min guard Gait Distance (Feet): 100 Feet Assistive device: Rolling walker (2 wheeled) Gait Pattern/deviations: Step-through pattern;Trunk flexed Gait velocity: decreased Gait velocity interpretation: 1.31 - 2.62 ft/sec, indicative of limited community ambulator General Gait Details: pt did well for first 50', needed a rest berak at that point, O2 sats 87% but recovered quickly and HR 145 bpm, down to 118 bpm with standing rest and pursed lip breathing. Pt ambulated another 50' and O2 sats dropped to 85% on RA with HR back up to 145 bpm and took >3 mins to recover. Pt's LE's began to shake and she felt that she would fall. Chair brought to pt and she sat, took another 2 mins for O2 sats to return to 90%. Placed on 2L O2 in room and sats returned to 94% and HR decreased to 88 bpm.    Stairs            Wheelchair Mobility    Modified Rankin (Stroke Patients Only)       Balance Overall balance assessment: Needs assistance Sitting-balance support: No upper extremity supported Sitting balance-Leahy Scale: Good     Standing balance support: Bilateral upper extremity supported Standing balance-Leahy Scale: Poor Standing balance comment: needs UE support for safety currently                             Pertinent Vitals/Pain Pain Assessment: Faces Faces Pain Scale: Hurts even more  Pain Location: hips Pain Descriptors / Indicators: Aching;Spasm Pain Intervention(s): Limited activity within patient's tolerance;Monitored during session    Home Living Family/patient expects to be discharged to:: Private residence Living Arrangements: Spouse/significant other;Parent Available Help at Discharge: Family;Available 24 hours/day Type of Home: House Home Access: Stairs to enter   [employer] of Steps: 2 Home Layout: One level Home Equipment: Walker - 4 wheels;Wheelchair - manual;Crutches;Adaptive equipment       Prior Function Level of Independence: Independent with assistive device(s)         Comments: uses RW as needed. Has been using father in law's rollator but it is not bariatric rated     Hand Dominance   Dominant Hand: Right    Extremity/Trunk Assessment   Upper Extremity Assessment Upper Extremity Assessment: Defer to OT evaluation    Lower Extremity Assessment Lower Extremity Assessment: Generalized weakness(from inactivity)    Cervical / Trunk Assessment Cervical / Trunk Assessment: Normal  Communication   Communication: No difficulties  Cognition Arousal/Alertness: Awake/alert Behavior During Therapy: Anxious Overall Cognitive Status: Within Functional Limits for tasks assessed                                        General Comments General comments (skin integrity, edema, etc.): distance ambulated simulated distance from inside pt's home to car. Pt would benefit from w/c for community distances    Exercises     Assessment/Plan    PT Assessment Patient needs continued PT services  PT Problem List Decreased strength;Decreased activity tolerance;Decreased mobility;Cardiopulmonary status limiting activity;Pain       PT Treatment Interventions DME instruction;Gait training;Stair training;Functional mobility training;Therapeutic activities;Therapeutic exercise;Balance training;Patient/family education    PT Goals (Current goals can be found in the Care Plan section)  Acute Rehab PT Goals Patient Stated Goal: be able to walk farther PT Goal Formulation: With patient Time For Goal Achievement: 04/24/18 Potential to Achieve Goals: Fair    Frequency Min 3X/week   Barriers to discharge        Co-evaluation               AM-PAC PT "6 Clicks" Daily Activity  Outcome Measure Difficulty turning over in bed (including adjusting bedclothes, sheets and blankets)?: None Difficulty moving from lying on back to sitting on the side of the bed? :  A Little Difficulty sitting down on and standing up from a chair with arms (e.g., wheelchair, bedside commode, etc,.)?: A Little Help needed moving to and from a bed to chair (including a wheelchair)?: A Little Help needed walking in hospital room?: A Little Help needed climbing 3-5 steps with a railing? : A Lot 6 Click Score: 18    End of Session   Activity Tolerance: Patient limited by fatigue Patient left: in chair;with call bell/phone within reach Nurse Communication: Mobility status PT Visit Diagnosis: Pain;Muscle weakness (generalized) (M62.81);Difficulty in walking, not elsewhere classified (R26.2) Pain - Right/Left: (B) Pain - part of body: Hip    Time: 5277-8242 PT Time Calculation (min) (ACUTE ONLY): 25 min   Charges:   PT Evaluation $PT Eval Moderate Complexity: 1 Mod PT Treatments $Gait Training: 8-22 mins        [name], PT  Acute Rehab Services  Pager [phone] Office [phone]   Lawana Chambers Edvardo Honse 04/10/2018, 2:10 PM

## 2018-04-10 NOTE — Evaluation (Signed)
Occupational Therapy Evaluation Patient Details Name: Courtney Zamora MRN: [id number] DOB: Mar 27, 1986 Today's Date: 04/10/2018    History of Present Illness 32 year old woman admitted for observation of new infiltrates seen incidentally on CT of her chest. She has rheumatoid arthritis previously treated with methotrexate   Clinical Impression   PT admitted with fatigue and decr activity tolerance. Pt currently with functional limitiations due to the deficits listed below (see OT problem list). Pt demonstrates DOE with minimal mobility and adl engagement. Pt relies on (A) from family for daily adls.  Pt will benefit from skilled OT to increase their independence and safety with adls and balance to allow discharge outpatient.     Follow Up Recommendations  Outpatient OT    Equipment Recommendations  None recommended by OT    Recommendations for Other Services       Precautions / Restrictions Precautions Precautions: Other (comment) Precaution Comments: watch O2 sats Restrictions Weight Bearing Restrictions: No      Mobility Bed Mobility Overal bed mobility: Independent                Transfers Overall transfer level: Needs assistance Equipment used: Rolling walker (2 wheeled) Transfers: Sit to/from Stand Sit to Stand: Supervision Stand pivot transfers: Min guard       General transfer comment: pt reports "i try not to use the walker unless i have to " pt reports using it in the community as support    Balance Overall balance assessment: Needs assistance Sitting-balance support: No upper extremity supported Sitting balance-Leahy Scale: Good     Standing balance support: Bilateral upper extremity supported;During functional activity Standing balance-Leahy Scale: Poor Standing balance comment: needs UE support for safety currently                           ADL either performed or assessed with clinical judgement   ADL Overall ADL's : Needs  assistance/impaired Eating/Feeding: Independent   Grooming: Wash/dry hands   Upper Body Bathing: Independent   Lower Body Bathing: Moderate assistance   Upper Body Dressing : Independent   Lower Body Dressing: Moderate assistance Lower Body Dressing Details (indicate cue type and reason): pt reports spouse dressed her daily prior to admission.  Toilet Transfer: Supervision/safety           Functional mobility during ADLs: Supervision/safety General ADL Comments: Pt fatigues quickly and noted to have DOE. pt reports "i am really having to hold my leg muscles so i dont fall right now"      Vision Baseline Vision/History: Wears glasses Wears Glasses: At all times       Perception     Praxis      Pertinent Vitals/Pain Pain Assessment: No/denies pain Faces Pain Scale: Hurts even more Pain Location: hips Pain Descriptors / Indicators: Aching;Spasm Pain Intervention(s): Limited activity within patient's tolerance;Monitored during session     Hand Dominance Right   Extremity/Trunk Assessment Upper Extremity Assessment Upper Extremity Assessment: Overall WFL for tasks assessed   Lower Extremity Assessment Lower Extremity Assessment: Generalized weakness   Cervical / Trunk Assessment Cervical / Trunk Assessment: Normal   Communication Communication Communication: No difficulties   Cognition Arousal/Alertness: Awake/alert Behavior During Therapy: WFL for tasks assessed/performed Overall Cognitive Status: Within Functional Limits for tasks assessed  General Comments  pt noted to have oxygen saturations 93-96 during session. Pt demonstrates DOE 3 out 4 but saturations remain above 90. Rn notified. PT notified so that it can be second a second time.     Exercises Exercises: Other exercises Other Exercises Other Exercises: educated on the use of cell phone to set alarms to notify patient to move and for exercise. pt  educated on x3 easy supported UB excercises during session   Shoulder Instructions      Home Living Family/patient expects to be discharged to:: Private residence Living Arrangements: Spouse/significant other;Parent(lives with in laws) Available Help at Discharge: Family;Available 24 hours/day Type of Home: House Home Access: Stairs to enter [employer] of Steps: 2   Home Layout: One level     Bathroom Shower/Tub: Walk-in shower(stairs to enter )   Firefighter: Handicapped height     Home Equipment: Environmental consultant - 4 wheels;Wheelchair - manual;Adaptive equipment;Crutches(metal folding chair to sit in shower) Adaptive Equipment: Reacher        Prior Functioning/Environment Level of Independence: Independent with assistive device(s)        Comments: uses walker PRN        OT Problem List: Decreased strength;Decreased range of motion;Impaired balance (sitting and/or standing);Decreased activity tolerance;Obesity;Cardiopulmonary status limiting activity      OT Treatment/Interventions: Self-care/ADL training;Therapeutic exercise;Energy conservation;DME and/or AE instruction;Therapeutic activities;Patient/family education;Balance training    OT Goals(Current goals can be found in the care plan section) Acute Rehab OT Goals Patient Stated Goal: to be a 31 yo again and not a 32 yo toddler OT Goal Formulation: With patient Time For Goal Achievement: 04/24/18 Potential to Achieve Goals: Good  OT Frequency: Min 2X/week   Barriers to D/C:            Co-evaluation              AM-PAC PT "6 Clicks" Daily Activity     Outcome Measure Help from another person eating meals?: None Help from another person taking care of personal grooming?: None Help from another person toileting, which includes using toliet, bedpan, or urinal?: None Help from another person bathing (including washing, rinsing, drying)?: None Help from another person to put on and taking off  regular upper body clothing?: None Help from another person to put on and taking off regular lower body clothing?: A Lot 6 Click Score: 22   End of Session Nurse Communication: Mobility status;Precautions  Activity Tolerance: Patient limited by fatigue Patient left: in chair;with call bell/phone within reach;with chair alarm set  OT Visit Diagnosis: Muscle weakness (generalized) (M62.81)                Time: 5053-9767 OT Time Calculation (min): 19 min Charges:  OT General Charges $OT Visit: 1 Visit OT Evaluation $OT Eval Moderate Complexity: 1 Mod   Mateo Flow, OTR/L  Acute Rehabilitation Services Pager: [phone] Office: [phone] .   Boone Master B 04/10/2018, 4:42 PM

## 2018-04-10 NOTE — Progress Notes (Signed)
Initial Nutrition Assessment  DOCUMENTATION CODES:   Morbid obesity  INTERVENTION:  Glucerna Shake po TID, each supplement provides 220 kcal and 10 grams of protein. Patient prefers chocolate flavor.  MVI with minerals daily    NUTRITION DIAGNOSIS:   Inadequate oral intake related to decreased appetite as evidenced by per patient/family report.  GOAL:   Patient will meet greater than or equal to 90% of their needs   MONITOR:   PO intake, Supplement acceptance, Labs, Skin, Weight trends, I & O's  REASON FOR ASSESSMENT:   Malnutrition Screening Tool    ASSESSMENT:   Courtney Zamora is a 32 yo female with PMH of asthma, type 2 diabetes, morbid obesity, hypertension, PCOS, rheumatoid arthritis, PTSD, GERD, and seizures admitted for acute respiratory failure with hypoxia.   Visited pt in room today. She reports that she has had a decreased appetite over the last few months and has had unintentional weight loss as a result. She reports eating 1.5 meals a day; she said this usually consists of a meat and two vegetable sides and a snack of mini Pop-Tarts. She occasionally drinks high-protein meal replacement shakes similar to SlimFast. Pt diagnosed with type 2 diabetes earlier this year (2/19) and says she has been compliant with insulin at home. She takes the oral med Januvia as well. She has no trouble obtaining insulin, but says she has trouble affording glucose test strips due to testing 5-6 times a day. Due to needle phobia, pt is interested in having a continuous glucose monitor. She says mother-in-law also has diabetes, so they have begun testing together and making a competition out of keeping blood glucose stable. Pt is amenable to chocolate Ensure; would recommend Glucerna shakes in chocolate flavor to help with better stabilization of blood glucose.   Although pt has had some unintentional weight loss (4.6% since 12/21/17), does not meet criteria for malnutrition at this time.    Labs reviewed: CBG 184 - 259 (9/23) Medications reviewed and include: Novolog 12 units + sliding scale, Lantus 50 units, prednisone   NUTRITION - FOCUSED PHYSICAL EXAM:    Most Recent Value  Orbital Region  No depletion  Upper Arm Region  No depletion  Thoracic and Lumbar Region  No depletion  Buccal Region  No depletion  Temple Region  No depletion  Clavicle Bone Region  No depletion  Clavicle and Acromion Bone Region  No depletion  Scapular Bone Region  No depletion  Dorsal Hand  No depletion  Patellar Region  No depletion  Anterior Thigh Region  No depletion  Posterior Calf Region  No depletion  Edema (RD Assessment)  Moderate [bilateral lower extremities ]  Hair  Reviewed  Eyes  Reviewed  Mouth  Reviewed  Skin  Reviewed [lesions on face,  wound right lower leg]  Nails  Reviewed       Diet Order:   Diet Order            Diet heart healthy/carb modified Room service appropriate? Yes; Fluid consistency: Thin  Diet effective now              EDUCATION NEEDS:   Education needs have been addressed  Skin:  Skin Assessment: Reviewed RN Assessment(wound on right lower extremity)  Last BM:   N/A per chart  Height:   Ht Readings from Last 1 Encounters:  04/10/18 5\' 4"  (1.626 m)    Weight:   Wt Readings from Last 1 Encounters:  04/10/18 (!) 176.4 kg  Ideal Body Weight:  54.5 kg  BMI:  Body mass index is 66.75 kg/m.  Estimated Nutritional Needs:   Kcal:  1700-1900  Protein:  85-95  Fluid:  1.7-1.9 L    Wil Slape, Dietetic Intern [phone]

## 2018-04-10 NOTE — Progress Notes (Signed)
Patient's equipment delivered.   Pateint called me into the room to inform me she has rolled her right ankle while walking to the bathroom. She would like for her right ankle to be assessed by MD while she is here. No significant swelling present as compared to initial assessment this morning. Patient complaining of ankle being very tender.   Paged Dr. Chesley Mires, to make aware.

## 2018-04-10 NOTE — Progress Notes (Addendum)
Subjective: Paged by RN and informed that patient sprained her ankle while ambulating to bathroom. Went to bedside to evaluate. She was able to limp back to bed on ankle. States she inverted her ankle, but did not fall. Pain is described as a throbbing ache.   Objective:   Right ankle with mild swelling, no significant bruising or deformity. Tenderness to palpation on lateral malleolus. Strength is intact. Painful passive ROM most notably with plantar flexion and inversion.   Assessment/Plan:  Inversion ankle sprain: Based on [place] ankle rules, imaging his not warranted since she is able to bare weight. Placed an ace wrap and encouraged her to rest, ice, elevate and keep compression on it at home. She already has the walker to assist her with ambulation. She is allergic to Ibuprofen but has Tylenol 3 at home that she takes as needed for pain.    Courtney Zamora D, DO 04/10/2018, 7:45 PM

## 2018-04-10 NOTE — Progress Notes (Signed)
SATURATION QUALIFICATIONS: (This note is used to comply with regulatory documentation for home oxygen)  Patient Saturations on Room Air at Rest = 95%  Patient Saturations on Room Air while Ambulating = 95%  Patient Saturations on 0 Liters of oxygen while Ambulating = 95%  Please briefly explain why patient needs home oxygen: Patient states she feels SOB and fatigued when ambulating, per PT.

## 2018-04-10 NOTE — Care Management (Signed)
    Durable Medical Equipment  (From admission, onward)         Start     Ordered   04/10/18 1435  For home use only DME 4 wheeled rolling walker with seat  Once    Comments:  Needs bariatric rollator  Question:  Patient needs a walker to treat with the following condition  Answer:  Rheumatoid arthritis (HCC)   04/10/18 1435   04/10/18 1430  For home use only DME standard manual wheelchair with seat cushion  Once    Comments:  Patient suffers from rheumatoid arthritis which impairs their ability to walk long distances.  A walker will not resolve  issue. A wheelchair will allow patient to mobilize safely for long periods of time. Patient can safely propel the wheelchair in the home or has a caregiver who can provide assistance.  Accessories: elevating leg rests (ELRs), wheel locks, extensions and anti-tippers.   04/10/18 1435

## 2018-04-10 NOTE — Progress Notes (Signed)
Subjective: Courtney Zamora was seen and evaluated at bedside on morning rounds. She had no acute events overnight. The pain and sensation that something was caught in her throat has resolved. Endorses a mild productive cough. No chest pain, hemoptysis, fevers, chills or sick contacts.  Patient endorses chronic dyspnea on exertion for several months which she states is somewhat improved since transitioning from Methotrexate to Enbrel for Rheumatoid Arthritis.  We discussed that she likely does not have an acute lung infection and can be discharged home later today after we evaluate her oxygen status with ambulation. Encouraged her to continue close follow-up with PCP, pulmonologist and rheumatologist.    Objective:  Vital signs in last 24 hours: Vitals:   04/10/18 0546 04/10/18 0924 04/10/18 1015 04/10/18 1453  BP: 137/70   126/78  Pulse: 77   79  Resp: 18   18  Temp: 98.1 F (36.7 C)   97.8 F (36.6 C)  TempSrc: Oral   Oral  SpO2: 96%   95%  Weight:   (!) 176.4 kg   Height:  5\' 4"  (1.626 m) 5\' 4"  (1.626 m)    General: awake, alert, pleasant female sitting up in bed in NAD CV: RRR; no murmurs, rubs or gallops Pulm: No increased work of breathing; saturating on room air. Lungs CTA without wheezes or crackles Ext: 1+ pitting edema of bilateral lower extremities   Assessment/Plan:  Principal Problem:   Abnormal CT of the chest Active Problems:   Morbid (severe) obesity due to excess calories (HCC)   Rheumatoid arthritis involving multiple sites with positive rheumatoid factor (HCC)  1. Abnormal chest CT: acute infection less likely given afebrile, no leukocytosis, and lack of clinical symptoms. Will therefore discontinue antibiotic therapy. RA-ILD was considered, but less likely based on normal DLCO upon review of recent PFTs, no crackles on exam, and saturating well on room air. The infiltrates on CT may still be related to RA. She is being followed by rheumatologist who recently  switched her from methotrexate to Enbrel. Patient has already noted improvement in her pulmonary symptoms and chronic joint pain. We will continue Enbrel and decrease Prednisone back to 5 mg daily, as I do not see indication for high dose steroids at this time. She should continue close follow-up with pulmonologist, PCP, and rheumatologist.   2. Sensation of foreign body: Original chief complaint of sensation of foreign body in esophagus has been ruled out with CT neck, and patient notes this symptom has resolved.   3. History of Asthma OSA Not on CPAP  PFTs in April showed mildly reduced FEV1 and FVC with normal FEV1/FVC ratio. Significantly reduced ERV due to obesity. Patient had desaturation to 88% at rest on room air, but has saturated well on room air since that time. Upon ambulation with PT, she had desaturation to 85% which improved on 2L nasal canula.  - have ordered home oxygen for her to use as needed with exertion - continued on home albuterol as needed  - in regards to OSA, patient recently got insurance and plans to pursue home CPAP with PCP.   4. Rheumatoid arthritis: Followed by rheumatology; recently transitioned from Methotrexate to Enbrel in addition to daily Prednisone 5 mg.  She endorses significant deconditioning due to lack of mobility secondary to joint pain. PT eval recommended home health PT and rollator which we have ordered. I am hopeful that therapy along with continued RA treatment she can get some improvement in daily functioning.   Dispo: Anticipated  discharge home today.   Courtney Zamora D, DO 04/10/2018, 5:18 PM Pager: [phone]

## 2018-04-10 NOTE — Progress Notes (Signed)
Pt is on CPAP at this time. Settings adjusted per patient comfort. Refer to flowsheet. Pt stated that she has had a sleep study in the past but is unaware of her home settings or sleep study settings. Pt is stable at this time

## 2018-04-10 NOTE — Care Management Note (Addendum)
Case Management Note  Patient Details  Name: Courtney Zamora MRN: [id number] Date of Birth: 11-22-1985  Subjective/Objective:                    Action/Plan:  Discussed discharge planning with patient at bedside. Confirmed face sheet information. DME ordered through [facility]. Explained to patient [facility] checking to see if insurance will cover oxygen due to no chronic lung disease. Also Insurance will only cover wheelchair or Rollator not both . Patient voices understanding. Dan with [facility] will insurance coverage to patient.    Per Jesusita Oka with [facility] Medicaid will cover wheelchair and rollator and oxygen  Expected Discharge Date:  04/10/18               Expected Discharge Plan:  Home w Home Health Services  In-House Referral:     Discharge planning Services  CM Consult  Post Acute Care Choice:  Durable Medical Equipment, Home Health Choice offered to:  Patient  DME Arranged:  Walker rolling with seat, Wheelchair manual, Oxygen DME Agency:  [facility]  HH Arranged:  PT [facility] Agency:  [facility]  Status of Service:  In process, will continue to follow  If discussed at Long Length of Stay Meetings, dates discussed:    Additional Comments:  Kingsley Plan, RN 04/10/2018, 3:30 PM

## 2018-04-10 NOTE — Progress Notes (Signed)
Went over post op instructions with patient.  Discharge papers and medical equipment given to husband.  IV removed, all questions answered.

## 2018-04-10 NOTE — Discharge Summary (Signed)
Name: Courtney Zamora MRN: [id number] DOB: 29-Jun-1986 32 y.o. PCP: Elenore Paddy, FNP  Date of Admission: 04/08/2018  8:50 PM Date of Discharge: 04/10/2018 Attending Physician: Axel Filler, MD  Discharge Diagnosis: 1. Abnormal Chest CT 2. Sensation of foreign body 3. Rheumatoid Arthritis   Discharge Medications: Allergies as of 04/10/2018      Reactions   Bee Venom Anaphylaxis   Cinnamon Anaphylaxis   Doxycycline Hives   Ibuprofen Other (See Comments)   Abdominal pain   Lexapro [escitalopram Oxalate] Other (See Comments)   Generalized body shaking. ? sz   Tape Hives, Rash, Other (See Comments)   EKG STICKERS      Medication List    STOP taking these medications   methotrexate 2.5 MG tablet Commonly known as:  RHEUMATREX     TAKE these medications   ACCU-CHEK AVIVA PLUS w/Device Kit UAD UP TO TID TO TEST BLOOD SUGAR   ACCU-CHEK SOFTCLIX LANCETS lancets UAD UP TO TID TO CHECK BLOOD SUGAR   AEROCHAMBER MV inhaler Use as instructed   albuterol (2.5 MG/3ML) 0.083% nebulizer solution Commonly known as:  PROVENTIL Take 3 mLs (2.5 mg total) by nebulization every 6 (six) hours as needed for wheezing or shortness of breath.   albuterol 108 (90 Base) MCG/ACT inhaler Commonly known as:  PROVENTIL HFA;VENTOLIN HFA Inhale 2 puffs into the lungs every 6 (six) hours as needed for wheezing or shortness of breath (wheezing). For shortness of breath.   atenolol 25 MG tablet Commonly known as:  TENORMIN Take 0.5 tablets (12.5 mg total) by mouth daily.   BELBUCA 300 MCG Film Generic drug:  Buprenorphine HCl Place 300 mcg under the tongue 2 (two) times daily.   Calcium Citrate 200 MG Tabs Take 2 tablets (400 mg total) by mouth every other day. What changed:    how much to take  when to take this   cetirizine 10 MG tablet Commonly known as:  ZYRTEC TAKE 1 TABLET BY MOUTH EVERY NIGHT AT BEDTIME   DULoxetine 30 MG capsule Commonly known as:   CYMBALTA TAKE 1 CAPSULE BY MOUTH TWICE DAILY   ENBREL SURECLICK 50 MG/ML injection Generic drug:  etanercept Inject 50 mg as directed once a week.   FLUTTER Devi As directed.   furosemide 20 MG tablet Commonly known as:  LASIX TAKE 1 TABLET BY MOUTH EVERY OTHER DAY   gabapentin 300 MG capsule Commonly known as:  NEURONTIN TAKE 1 CAPSULE BY MOUTH EVERY MORNING, 1 CAPSULE BY MOUTH AT NOON, AND 2 CAPSULES BY MOUTH AT BEDTIME What changed:    how much to take  how to take this  when to take this   glucose blood test strip UAD UP TO TID TO CHECK BLOOD SUGAR   HAIR/SKIN/NAILS PO Take 1 tablet by mouth daily.   hydrochlorothiazide 25 MG tablet Commonly known as:  HYDRODIURIL Take 1 tablet (25 mg total) by mouth daily.   hydrOXYzine 10 MG tablet Commonly known as:  ATARAX/VISTARIL Take 1 tablet (10 mg total) by mouth 3 (three) times daily as needed.   insulin aspart 100 UNIT/ML injection Commonly known as:  novoLOG Inject 15 units in a.m with BF, 17 unis with lunch and 15 unis Subcut with dinner What changed:    how much to take  how to take this  when to take this  additional instructions   insulin glargine 100 UNIT/ML injection Commonly known as:  LANTUS Inject 0.52 mLs (52 Units total) into the skin at  bedtime.   ipratropium-albuterol 0.5-2.5 (3) MG/3ML Soln Commonly known as:  DUONEB Take 3 mLs by nebulization every 6 (six) hours as needed. What changed:    when to take this  reasons to take this   liraglutide 18 MG/3ML Sopn Commonly known as:  VICTOZA Inject 0.2 mLs (1.2 mg total) into the skin daily.   montelukast 10 MG tablet Commonly known as:  SINGULAIR Take 1 tablet (10 mg total) by mouth daily.   pantoprazole 40 MG tablet Commonly known as:  PROTONIX Take 1 tablet (40 mg total) by mouth 2 (two) times daily before a meal.   Pen Needles 31G X 8 MM Misc Use as directed   predniSONE 5 MG tablet Commonly known as:  DELTASONE TAKE 1  TABLET BY MOUTH EVERY DAY WITH BREAKFAST What changed:  See the new instructions.   promethazine 25 MG tablet Commonly known as:  PHENERGAN Take 1 tablet (25 mg total) by mouth every 8 (eight) hours as needed for nausea or vomiting.   sitaGLIPtin 50 MG tablet Commonly known as:  JANUVIA Take 1 tablet (50 mg total) by mouth daily.   tiZANidine 4 MG tablet Commonly known as:  ZANAFLEX Take 1 tablet (4 mg total) by mouth every 6 (six) hours as needed for muscle spasms.   Vitamin D (Ergocalciferol) 50000 units Caps capsule Commonly known as:  DRISDOL Take 50,000 Units by mouth once a week.   WOMENS MULTI Caps Take 1 capsule by mouth daily.       Disposition and follow-up:   Ms.Ahava L Zehring was discharged from [facility] in Good condition.  At the hospital follow up visit please address:  1.  Abnormal chest CT: Patient was admitted for evaluation after incidental finding on CT. Work-up for infectious process was negative. Felt to be related to RA and was continued on Enbrel with plan to follow-up with rheumatologist. High dose steroids were not indicated, and she was continued on home dose of Prednisone 5 mg daily.  Sensation of foreign body: this was her original chief complaint. CT neck ruled out presence of foreign body, and patient's symptoms resolved by the following day.  History of asthma, OSA not on CPAP: patient had desaturation to 85% on room air when ambulating distance >50 feet. She was discharged on 2L home oxygen to use as needed with exertion.    2.  Labs / imaging needed at time of follow-up: consider repeat imaging if she were to have worsening of her chronic pulmonary symptoms.   3.  Pending labs/ test needing follow-up: none  Follow-up Appointments: [place] Follow up.   Contact information: [place] [id number] [phone]        Elenore Paddy, [place]. Schedule an  appointment as soon as possible for a visit in 1 week(s).   Specialty:  Family Medicine Contact information: [place] [phone]           Hospital Course by problem list: 1. Abnormal chest CT: Patient admitted for evaluation of incidental finding on chest CT. Originally concern for multifocal pneumonia and was started on antibiotics and high dose steroids. Antibiotics were discontinued the following day given she was afebrile, had no leukocytosis, and lack of clinical symptoms. 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 Infiltrates on CT likely related to RA. She is followed by rheumatologist who recently switched her from Methotrexate to Enbrel. Ms. Prien had already noticed significant improvement in her pulmonary symptoms and chronic joint pain since switching therapies. We continued her Enbrel and decreased Prednisone back to her 5 mg daily dose.   2. Sensation of  Foreign body: patient presented with original chief complaint of the sensation that there was a chicken bone stuck in her esophagus ever since eating dinner the night prior to presentation. CT neck ruled out presence of foreign body. Her symptoms had resolved by the next day without any intervention.   3. History of Asthma, OSA not on CPAP Patient's PFTs in April showed a mildly reduced FEV1 and FVC with normal FEV1/FVC ratio. There was significantly reduced ERV due to morbid obesity. Patient initially had desaturation to 88% at rest on room air in the ED, but saturated well on room air throughout admission. Upon ambulation with PT, she desaturated to 85% with distances greater than 50 feet. She was discharged with 2L home O2 to use as needed with exertion. Her Albuterol was continued. In regards to OSA, she tolerated CPAP overnight and planned on pursuing home CPAP with  PCP since she was recently able to obtain insurance.   4. Rheumatoid arthritis: Followed by rheumatologist. Has recently transitioned from Methotrexate to Enbrel with significant clinical improvement. Patient was significantly deconditioned due to lack of mobility secondary to chronic joint pain. PT eval recommended home therapy and rollator which were ordered at discharge.   Discharge Vitals:   BP 126/78 (BP Location: Left Wrist)   Pulse 79   Temp 97.8 F (36.6 C) (Oral)   Resp 18   Ht '5\' 4"'$  (1.626 m)   Wt (!) 176.4 kg   LMP 04/08/2018 (Exact Date)   SpO2 95%   BMI 66.75 kg/m   Pertinent Labs, Studies, and Procedures:    Discharge Instructions: Discharge Instructions    Call MD for:  difficulty breathing, headache or visual disturbances   Complete by:  As directed    Call MD for:  extreme fatigue   Complete by:  As directed    Call MD for:  persistant dizziness or light-headedness   Complete by:  As directed    Call MD for:  persistant nausea and vomiting   Complete by:  As directed    Call MD for:  severe uncontrolled pain   Complete by:  As directed    Call MD for:  temperature >100.4   Complete by:  As directed    Diet - low sodium heart healthy   Complete by:  As directed    Discharge instructions   Complete by:  As directed    Ms. Reza, it was a pleasure taking care of you. You have done a great job with pursuing your health and getting plugged in with the right specialists. I am hopeful that your lung symptoms will continue to improve as you get your rheumatoid arthritis treated. I have placed orders for you to get PT at home which I think will help you start getting some conditioning, mobility and stamina back. I have also ordered home oxygen for you to have as needed with exertion, as well as a durable rolling walker and wheelchair.  Please continue taking all your medications as prescribed and following up with your rheumatologist. I would also recommend a hospital  follow-up appointment with your PCP in 1-2 weeks.  I wish you all  the best!   Increase activity slowly   Complete by:  As directed       Signed: Delice Bison, DO 04/15/2018, 7:00 PM   Pager: [phone]

## 2018-04-11 LAB — ALPHA-1-ANTITRYPSIN: A1 ANTITRYPSIN SER: 161 mg/dL (ref 90–200)

## 2018-04-12 ENCOUNTER — Ambulatory Visit: Payer: Medicaid Other

## 2018-04-12 LAB — ANCA TITERS
Atypical P-ANCA titer: <1:20 {titer}
C-ANCA: <1:20 {titer}
P-ANCA: <1:20 {titer}

## 2018-04-15 ENCOUNTER — Other Ambulatory Visit: Payer: Self-pay | Admitting: Internal Medicine

## 2018-04-23 ENCOUNTER — Other Ambulatory Visit: Payer: Self-pay | Admitting: Internal Medicine

## 2018-04-23 ENCOUNTER — Ambulatory Visit: Payer: Self-pay | Admitting: Endocrinology

## 2018-04-23 DIAGNOSIS — F0781 Postconcussional syndrome: Secondary | ICD-10-CM

## 2018-04-24 ENCOUNTER — Ambulatory Visit: Payer: Medicaid Other | Admitting: Pulmonary Disease

## 2018-04-24 ENCOUNTER — Ambulatory Visit: Payer: Medicaid Other | Attending: Family Medicine | Admitting: Physical Therapy

## 2018-04-24 ENCOUNTER — Encounter: Payer: Self-pay | Admitting: Pulmonary Disease

## 2018-04-24 VITALS — BP 130/78 | HR 73 | Ht 64.0 in | Wt 394.0 lb

## 2018-04-24 DIAGNOSIS — G4733 Obstructive sleep apnea (adult) (pediatric): Secondary | ICD-10-CM | POA: Diagnosis not present

## 2018-04-24 DIAGNOSIS — J4540 Moderate persistent asthma, uncomplicated: Secondary | ICD-10-CM | POA: Diagnosis not present

## 2018-04-24 MED ORDER — PREDNISONE 10 MG PO TABS: ORAL_TABLET | ORAL | 0 refills | Status: DC

## 2018-04-24 NOTE — Progress Notes (Signed)
Courtney Zamora    [id number]    April 12, 1986  Primary Care Physician:Kartaoui, YPPJKD, [place]  Referring Physician: No referring provider defined for this encounter.  Chief complaint: Follow-up for RA-ILD, Dyspnea  HPI: 32 year old with history of anxiety, asthma, depression, obesity, severe rheumatoid arthritis.  She has a diagnosis of exercise-induced asthma childhood asthma.  Sensitive to strong perfumes, cinnamon, cleaning with liquids.  Has complains of dyspnea with activity for the past few months.  She denies dyspnea at rest or wheezing.  No cough, sputum production.  No significant problems with snoring.  Denies daytime sleepiness. Previously on Symbicort which was stopped as it was not helping her symptoms.  Worked up for recurrent respiratory failure with bilateral pulmonary infiltrates responsive to steroids Noted to have elevated rheumatoid arthritis and CCP.  Referred to rheumatology. Seen by GI for esophageal abnormality showing possible polyps and severe fatty liver. Sleep study shows sleep apnea with desaturation.  She is awaiting initiation of CPAP.  Pets: Has a number of animals at home including snakes, lizards, hedgehogs, ferrets, dogs Occupation: Currently unemployed Exposures: No relevant exposure, no mold at home Smoking history: non smoker Travel History: Not relevant  Interim History: Evaluated by Dr. Posey Pronto at [facility] rheumatology in Aug 2018.  Rheumatoid arthritis confirmed with positive serology, erosive changes of joints. She was initially on methotrexate but developed elevated LFTs.  Did not tolerate Plaquenil due to rash, GI upset, possible QT prolongation.  She was started on Enbrel in Aug 2019.  She continues on prednisone at 5 mg  Admitted in September 2019 for respiratory failure with bilateral pulmonary infiltrates on CT scan.  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  Per discharge summary she did not get additional steroids or antibiotics.   She was discharged on 2 L home oxygen. Since her discharge she is followed up with her primary care at [facility] and received 2 courses of antibiotic for persistent abnormality on chest x-ray.  I do not have the report of the x-ray to review. She is in the process of transferring pulmonary care to Dr. Verdie Mosher, Pulmonary at [facility].  Outpatient Encounter Medications as of 04/24/2018  Medication Sig  . ACCU-CHEK SOFTCLIX LANCETS lancets UAD UP TO TID TO CHECK BLOOD SUGAR  . albuterol (PROVENTIL HFA;VENTOLIN HFA) 108 (90 Base) MCG/ACT inhaler Inhale 2 puffs into the lungs every 6 (six) hours as needed for wheezing or shortness of breath (wheezing). For shortness of breath.  Marland Kitchen albuterol (PROVENTIL) (2.5 MG/3ML) 0.083% nebulizer solution Take 3 mLs (2.5 mg total) by nebulization every 6 (six) hours as needed for wheezing or shortness of breath.  Marland Kitchen atenolol (TENORMIN) 25 MG tablet Take 0.5 tablets (12.5 mg total) by mouth daily.  Marland Kitchen BELBUCA 300 MCG FILM Place 300 mcg under the tongue 2 (two) times daily.  . Biotin w/ Vitamins C & E (HAIR/SKIN/NAILS PO) Take 1 tablet by mouth daily.  . Blood Glucose Monitoring Suppl (ACCU-CHEK AVIVA PLUS) w/Device KIT UAD UP TO TID TO TEST BLOOD SUGAR  . Calcium Citrate 200 MG TABS Take 2 tablets (400 mg total) by mouth every other day. (Patient taking differently: Take 200 mg by mouth daily. )  . cetirizine (ZYRTEC) 10 MG tablet TAKE 1 TABLET BY MOUTH EVERY NIGHT AT BEDTIME (Patient taking differently: Take 10 mg by mouth at bedtime. )  . DULoxetine (CYMBALTA) 30 MG capsule TAKE 1 CAPSULE BY MOUTH TWICE DAILY (Patient taking differently:  Take 30 mg by mouth 2 (two) times daily. )  . etanercept (ENBREL SURECLICK) 50 MG/ML injection Inject 50 mg as directed once a week.   . furosemide (LASIX) 20 MG tablet TAKE 1 TABLET BY MOUTH EVERY OTHER DAY (Patient taking differently: Take 20 mg by mouth every other day. )  . gabapentin (NEURONTIN) 300 MG  capsule TAKE 1 CAPSULE BY MOUTH EVERY MORNING, 1 CAPSULE BY MOUTH AT NOON, AND 2 CAPSULES BY MOUTH AT BEDTIME (Patient taking differently: Take 300-600 mg by mouth See admin instructions. TAKE 1 CAPSULE BY MOUTH EVERY MORNING, 1 CAPSULE BY MOUTH AT NOON, AND 2 CAPSULES BY MOUTH AT BEDTIME)  . glucose blood (ACCU-CHEK AVIVA PLUS) test strip UAD UP TO TID TO CHECK BLOOD SUGAR  . hydrochlorothiazide (HYDRODIURIL) 25 MG tablet Take 1 tablet (25 mg total) by mouth daily.  . hydrOXYzine (ATARAX/VISTARIL) 10 MG tablet Take 1 tablet (10 mg total) by mouth 3 (three) times daily as needed.  . insulin aspart (NOVOLOG) 100 UNIT/ML injection Inject 15 units in a.m with BF, 17 unis with lunch and 15 unis Subcut with dinner (Patient taking differently: Inject 15-17 Units into the skin See admin instructions. Use 15 units with breakfast then use 17 units at lunch then use 15 units with dinner)  . insulin glargine (LANTUS) 100 UNIT/ML injection Inject 0.52 mLs (52 Units total) into the skin at bedtime.  . Insulin Pen Needle (PEN NEEDLES) 31G X 8 MM MISC Use as directed  . ipratropium-albuterol (DUONEB) 0.5-2.5 (3) MG/3ML SOLN Take 3 mLs by nebulization every 6 (six) hours as needed. (Patient taking differently: Take 3 mLs by nebulization every 4 (four) hours as needed (for shortness of breath or wheezing). )  . liraglutide (VICTOZA) 18 MG/3ML SOPN Inject 0.2 mLs (1.2 mg total) into the skin daily.  . montelukast (SINGULAIR) 10 MG tablet Take 1 tablet (10 mg total) by mouth daily.  . Multiple Vitamins-Minerals (WOMENS MULTI) CAPS Take 1 capsule by mouth daily.  . pantoprazole (PROTONIX) 40 MG tablet Take 1 tablet (40 mg total) by mouth 2 (two) times daily before a meal.  . predniSONE (DELTASONE) 5 MG tablet TAKE 1 TABLET BY MOUTH EVERY DAY WITH BREAKFAST (Patient taking differently: Take 5 mg by mouth daily with breakfast. )  . promethazine (PHENERGAN) 25 MG tablet Take 1 tablet (25 mg total) by mouth every 8 (eight)  hours as needed for nausea or vomiting.  Marland Kitchen Respiratory Therapy Supplies (FLUTTER) DEVI As directed.  . sitaGLIPtin (JANUVIA) 50 MG tablet Take 1 tablet (50 mg total) by mouth daily.  Marland Kitchen Spacer/Aero-Holding Chambers (AEROCHAMBER MV) inhaler Use as instructed  . tiZANidine (ZANAFLEX) 4 MG tablet TAKE 1 TABLET(4 MG) BY MOUTH EVERY 6 HOURS AS NEEDED FOR MUSCLE SPASMS  . Vitamin D, Ergocalciferol, (DRISDOL) 50000 units CAPS capsule Take 50,000 Units by mouth once a week.   No facility-administered encounter medications on file as of 04/24/2018.     Allergies as of 04/24/2018 - Review Complete 04/24/2018  Allergen Reaction Noted  . Bee venom Anaphylaxis 07/29/2013  . Cinnamon Anaphylaxis 09/16/2012  . Doxycycline Hives 07/19/2017  . Ibuprofen Other (See Comments) 11/12/2013  . Lexapro [escitalopram oxalate] Other (See Comments) 03/17/2017  . Tape Hives, Rash, and Other (See Comments) 10/02/2016    Past Medical History:  Diagnosis Date  . Anxiety   . Asthma   . Chronic headache    "1-2/week" (04/09/2018)  . Chronic lower back pain   . Depression   . Essential  hypertension   . Family history of adverse reaction to anesthesia    "mom would get PONV" (04/09/2018)  . GERD (gastroesophageal reflux disease)   . Manic state ([place])   . Migraine    "a few/year" (04/09/2018)  . Obesity   . OSA on CPAP   . Osteoarthritis, knee   . Pneumonia    "this is my 2nd time" (04/09/2018)  . Prolonged QT syndrome   . PTSD (post-traumatic stress disorder)   . Rheumatoid arthritis ([place])   . Seizures ([place])    "stress induced; 1 in this past year" (04/09/2018)  . Severe needle phobia   . Type II diabetes mellitus ([place])     Past Surgical History:  Procedure Laterality Date  . TONSILLECTOMY  1994    Family History  Problem Relation Age of Onset  . Hypothyroidism Mother   . Diabetes Father   . Diabetes Paternal Uncle   . Hypertension Maternal Grandmother   . Hypertension Maternal Grandfather   .  Pancreatic cancer Maternal Grandfather   . Hypertension Paternal Grandmother   . Heart disease Paternal Grandfather   . Hypertension Paternal Grandfather     Social History   Socioeconomic History  . Marital status: Married    Spouse name: Not on file  . Number of children: Not on file  . Years of education: Not on file  . Highest education level: Not on file  Occupational History  . Not on file  Social Needs  . Financial resource strain: Not on file  . Food insecurity:    Worry: Not on file    Inability: Not on file  . Transportation needs:    Medical: Not on file    Non-medical: Not on file  Tobacco Use  . Smoking status: Never Smoker  . Smokeless tobacco: Never Used  Substance and Sexual Activity  . Alcohol use: Not Currently  . Drug use: Never  . Sexual activity: Not Currently    Birth control/protection: None  Lifestyle  . Physical activity:    Days per week: Not on file    Minutes per session: Not on file  . Stress: Not on file  Relationships  . Social connections:    Talks on phone: Not on file    Gets together: Not on file    Attends religious service: Not on file    Active member of club or organization: Not on file    Attends meetings of clubs or organizations: Not on file    Relationship status: Not on file  . Intimate partner violence:    Fear of current or ex partner: Not on file    Emotionally abused: Not on file    Physically abused: Not on file    Forced sexual activity: Not on file  Other Topics Concern  . Not on file  Social History Narrative  . Not on file   Review of systems: Review of Systems  Constitutional: Negative for fever and chills.  HENT: Negative.   Eyes: Negative for blurred vision.  Respiratory: as per HPI  Cardiovascular: Negative for chest pain and palpitations.  Gastrointestinal: Negative for vomiting, diarrhea, blood per rectum. Genitourinary: Negative for dysuria, urgency, frequency and hematuria.  Musculoskeletal:  Negative for myalgias, back pain and joint pain.  Skin: Negative for itching and rash.  Neurological: Negative for dizziness, tremors, focal weakness, seizures and loss of consciousness.  Endo/Heme/Allergies: Negative for environmental allergies.  Psychiatric/Behavioral: Negative for depression, suicidal ideas and hallucinations.  All other systems  reviewed and are negative.  Physical Exam: Blood pressure 130/78, pulse 73, height 5' 4" (1.626 m), weight (!) 394 lb (178.7 kg), last menstrual period 04/08/2018, SpO2 94 %. Gen:      No acute distress HEENT:  EOMI, sclera anicteric Neck:     No masses; no thyromegaly Lungs:    Clear to auscultation bilaterally; normal respiratory effort CV:         Regular rate and rhythm; no murmurs Abd:      + bowel sounds; soft, non-tender; no palpable masses, no distension Ext:    No edema; adequate peripheral perfusion Skin:      Warm and dry; no rash Neuro: alert and oriented x 3 Psych: normal mood and affect  Data Reviewed: Imaging CT chest 08/10/17-very mild nonspecific groundglass opacity.  Dilation of pulmonic trunk with possible pulmonary arterial hypertension, hepatic steatosis, nodular densities in the distal third of the esophagus concerning for small polyps. CTA 09/05/17-groundglass opacity with air trapping, severe hepatic steatosis. CTA 11/23/2017- no pulmonary embolism, no acute infiltrate, air-trapping. CTA 04/09/2018- patchy bilateral groundglass opacities, consolidation, hepatic steatosis. I have reviewed the images personally.  PFTs Spirometry 05/01/17 FVC 2.53 [67%], FEV1 2.28 [72%], F/F 90 No obstruction, restriction possible.  PFTs 10/19/17 FVC 2.43 [71%], FEV1 2.20 [76%], F/F 90, TLC 93% DLCO 105%, DLCO/VA 156% Normal spirometry.  No restriction.  Increased diffusion capacity.  FENO 05/01/17-7 FENO 06/19/17- 5 FENO 07/28/17- 7 FENO 08/14/17-5  Sleep PSG 10/02/17 Sleep apnea, CPAP titration-recommended auto CPAP 10-21  cm  Cardiac Echocardiogram 08/17/17 LVEF 87-56%, no diastolic dysfunction.  PA systolic pressure within normal limits.  Labs 10/20/2017-ANA negative, CCP greater than 250, rheumatoid factor greater than 650, ANCA- Negative CBC 04/09/2018-WBC 11.7, eos 2%, absolute eosinophil count 234  Assessment:  Rheumatoid arthritis- ILD Currently on treatment with Enbrel, low-dose prednisone.  She has not tolerated methotrexate or Plaquenil Recent admission noted for bilateral infiltrates, respiratory failure.  Suspect this is secondary to RA ILD flare. We will give her a short prednisone taper starting at 40 mg.  Reduce dose by 10 mg every 3 days.  She will need coordination with rheumatology to optimize immunosuppressive therapy for ILD and may benefit from initiation of azathioprine or CellCept. She is in the process of transferring pulmonary care to [facility]  We will be available as needed going forward  Dyspnea  She has a diagnosis of childhood asthma and exercise-induced asthma.  However the symptoms are not really typical and FENO is low, PFTs does not show any obstruction. Continue albuterol and DuoNebs as needed.  Sleep apnea Reorder CPAP  Plan/Recommendations: - Prednisone taper - Continue albuterol, duo nebs - CPAP for sleep apnea    Marshell Garfinkel MD [facility] 04/24/2018, 9:11 AM  CC: No ref. provider found

## 2018-04-24 NOTE — Patient Instructions (Addendum)
We will reorder the CPAP since you have insurance now Give a prednisone taper starting at 40 mg.  Reduce dose by 10 mg every 3 days Follow-up with Dr. Allena Katz regarding management of rheumatoid arthritis We will get chest x-ray report from your primary care  We note that you are in the process of transferring pulmonary care to [facility] Please call us in future if needed.

## 2018-04-25 ENCOUNTER — Ambulatory Visit (INDEPENDENT_AMBULATORY_CARE_PROVIDER_SITE_OTHER): Payer: Medicaid Other | Admitting: Endocrinology

## 2018-04-25 ENCOUNTER — Telehealth: Payer: Self-pay | Admitting: Pulmonary Disease

## 2018-04-25 ENCOUNTER — Encounter: Payer: Self-pay | Admitting: Endocrinology

## 2018-04-25 VITALS — BP 118/82 | HR 100 | Ht 64.0 in | Wt 394.0 lb

## 2018-04-25 DIAGNOSIS — IMO0001 Reserved for inherently not codable concepts without codable children: Secondary | ICD-10-CM

## 2018-04-25 DIAGNOSIS — E1165 Type 2 diabetes mellitus with hyperglycemia: Secondary | ICD-10-CM | POA: Diagnosis not present

## 2018-04-25 DIAGNOSIS — G4733 Obstructive sleep apnea (adult) (pediatric): Secondary | ICD-10-CM

## 2018-04-25 LAB — POCT GLYCOSYLATED HEMOGLOBIN (HGB A1C): Hemoglobin A1C: 8 % — AB (ref 4.0–5.6)

## 2018-04-25 MED ORDER — INSULIN ASPART 100 UNIT/ML ~~LOC~~ SOLN: SUBCUTANEOUS | 5 refills | Status: DC

## 2018-04-25 MED ORDER — LIRAGLUTIDE 18 MG/3ML ~~LOC~~ SOPN: 1.8000 mg | PEN_INJECTOR | Freq: Every day | SUBCUTANEOUS | 6 refills | Status: DC

## 2018-04-25 MED ORDER — INSULIN GLARGINE 100 UNIT/ML ~~LOC~~ SOLN: 60.0000 [IU] | Freq: Every day | SUBCUTANEOUS | 3 refills | Status: DC

## 2018-04-25 NOTE — Progress Notes (Signed)
Subjective:    Patient ID: Courtney Zamora, female    DOB: 12-Dec-1985, 32 y.o.   MRN: [id number]  HPI pt is referred by Elenore Paddy, for diabetes.  Pt states DM was dx'ed in early 2019; she has mild if any neuropathy of the lower extremities; she is unaware of any associated chronic complications; she has been on insulin since dx; pt says her diet is fair, and exercise is limited by health problems; she has never had GDM, pancreatitis, pancreatic surgery, severe hypoglycemia or DKA.  She takes lantus 72/d, januvia, victoza 1.2/d, and novolog 3 times a day (just before each meal) 15-17-15 units.  Meter is downloaded today, and the printout is scanned into the record.  It varies from 68-407.  It is in general highest at lunch, and lowest fasting.  She is now back on tapering prednisone.  Past Medical History:  Diagnosis Date  . Anxiety   . Asthma   . Chronic headache    "1-2/week" (04/09/2018)  . Chronic lower back pain   . Depression   . Essential hypertension   . Family history of adverse reaction to anesthesia    "mom would get PONV" (04/09/2018)  . GERD (gastroesophageal reflux disease)   . Manic state ([place])   . Migraine    "a few/year" (04/09/2018)  . Obesity   . OSA on CPAP   . Osteoarthritis, knee   . Pneumonia    "this is my 2nd time" (04/09/2018)  . Prolonged QT syndrome   . PTSD (post-traumatic stress disorder)   . Rheumatoid arthritis ([place])   . Seizures ([place])    "stress induced; 1 in this past year" (04/09/2018)  . Severe needle phobia   . Type II diabetes mellitus ([place])     Past Surgical History:  Procedure Laterality Date  . TONSILLECTOMY  1994    Social History   Socioeconomic History  . Marital status: Married    Spouse name: Not on file  . Number of children: Not on file  . Years of education: Not on file  . Highest education level: Not on file  Occupational History  . Not on file  Social Needs  . Financial resource strain: Not on file  . Food  insecurity:    Worry: Not on file    Inability: Not on file  . Transportation needs:    Medical: Not on file    Non-medical: Not on file  Tobacco Use  . Smoking status: Never Smoker  . Smokeless tobacco: Never Used  Substance and Sexual Activity  . Alcohol use: Not Currently  . Drug use: Never  . Sexual activity: Not Currently    Birth control/protection: None  Lifestyle  . Physical activity:    Days per week: Not on file    Minutes per session: Not on file  . Stress: Not on file  Relationships  . Social connections:    Talks on phone: Not on file    Gets together: Not on file    Attends religious service: Not on file    Active member of club or organization: Not on file    Attends meetings of clubs or organizations: Not on file    Relationship status: Not on file  . Intimate partner violence:    Fear of current or ex partner: Not on file    Emotionally abused: Not on file    Physically abused: Not on file    Forced sexual activity: Not on file  Other  Topics Concern  . Not on file  Social History Narrative  . Not on file    Current Outpatient Medications on File Prior to Visit  Medication Sig Dispense Refill  . ACCU-CHEK SOFTCLIX LANCETS lancets UAD UP TO TID TO CHECK BLOOD SUGAR 100 each 12  . albuterol (PROVENTIL HFA;VENTOLIN HFA) 108 (90 Base) MCG/ACT inhaler Inhale 2 puffs into the lungs every 6 (six) hours as needed for wheezing or shortness of breath (wheezing). For shortness of breath. 1 Inhaler 11  . albuterol (PROVENTIL) (2.5 MG/3ML) 0.083% nebulizer solution Take 3 mLs (2.5 mg total) by nebulization every 6 (six) hours as needed for wheezing or shortness of breath. 150 mL 1  . atenolol (TENORMIN) 25 MG tablet Take 0.5 tablets (12.5 mg total) by mouth daily. 30 tablet 3  . BELBUCA 450 MCG FILM PLACE 1 FILM INSIDE CHEEKS EVERY 12 HOURS  0  . Biotin w/ Vitamins C & E (HAIR/SKIN/NAILS PO) Take 1 tablet by mouth daily.    . Blood Glucose Monitoring Suppl (ACCU-CHEK  AVIVA PLUS) w/Device KIT UAD UP TO TID TO TEST BLOOD SUGAR 1 kit 0  . Calcium Citrate 200 MG TABS Take 2 tablets (400 mg total) by mouth every other day. (Patient taking differently: Take 200 mg by mouth daily. )    . cetirizine (ZYRTEC) 10 MG tablet TAKE 1 TABLET BY MOUTH EVERY NIGHT AT BEDTIME (Patient taking differently: Take 10 mg by mouth at bedtime. ) 30 tablet 0  . DULoxetine (CYMBALTA) 30 MG capsule TAKE 1 CAPSULE BY MOUTH TWICE DAILY (Patient taking differently: Take 30 mg by mouth 2 (two) times daily. ) 60 capsule 2  . etanercept (ENBREL SURECLICK) 50 MG/ML injection Inject 50 mg as directed once a week.     . furosemide (LASIX) 20 MG tablet TAKE 1 TABLET BY MOUTH EVERY OTHER DAY (Patient taking differently: Take 20 mg by mouth every other day. ) 30 tablet 3  . gabapentin (NEURONTIN) 300 MG capsule TAKE 1 CAPSULE BY MOUTH EVERY MORNING, 1 CAPSULE BY MOUTH AT NOON, AND 2 CAPSULES BY MOUTH AT BEDTIME (Patient taking differently: Take 300-600 mg by mouth See admin instructions. TAKE 1 CAPSULE BY MOUTH EVERY MORNING, 1 CAPSULE BY MOUTH AT NOON, AND 2 CAPSULES BY MOUTH AT BEDTIME) 120 capsule 5  . glucose blood (ACCU-CHEK AVIVA PLUS) test strip UAD UP TO TID TO CHECK BLOOD SUGAR 100 each 12  . hydrochlorothiazide (HYDRODIURIL) 25 MG tablet Take 1 tablet (25 mg total) by mouth daily. 90 tablet 3  . hydrOXYzine (ATARAX/VISTARIL) 10 MG tablet Take 1 tablet (10 mg total) by mouth 3 (three) times daily as needed. 30 tablet 3  . Insulin Pen Needle (PEN NEEDLES) 31G X 8 MM MISC Use as directed 100 each 3  . ipratropium-albuterol (DUONEB) 0.5-2.5 (3) MG/3ML SOLN Take 3 mLs by nebulization every 6 (six) hours as needed. (Patient taking differently: Take 3 mLs by nebulization every 4 (four) hours as needed (for shortness of breath or wheezing). ) 360 mL 5  . montelukast (SINGULAIR) 10 MG tablet Take 1 tablet (10 mg total) by mouth daily. 30 tablet 5  . Multiple Vitamins-Minerals (WOMENS MULTI) CAPS Take 1  capsule by mouth daily.    . pantoprazole (PROTONIX) 40 MG tablet Take 1 tablet (40 mg total) by mouth 2 (two) times daily before a meal. 60 tablet 6  . predniSONE (DELTASONE) 10 MG tablet 4 tabs x 3 days, 3 tabs x 3 days, 2 tabs x 3 days,  1 tab x 3 days then stop 30 tablet 0  . promethazine (PHENERGAN) 25 MG tablet Take 1 tablet (25 mg total) by mouth every 8 (eight) hours as needed for nausea or vomiting. 30 tablet 2  . Respiratory Therapy Supplies (FLUTTER) DEVI As directed. 1 each 0  . sitaGLIPtin (JANUVIA) 50 MG tablet Take 1 tablet (50 mg total) by mouth daily. 30 tablet 6  . Spacer/Aero-Holding Chambers (AEROCHAMBER MV) inhaler Use as instructed 1 each 0  . tiZANidine (ZANAFLEX) 4 MG tablet TAKE 1 TABLET(4 MG) BY MOUTH EVERY 6 HOURS AS NEEDED FOR MUSCLE SPASMS 60 tablet 0  . Vitamin D, Ergocalciferol, (DRISDOL) 50000 units CAPS capsule Take 50,000 Units by mouth once a week.  3   No current facility-administered medications on file prior to visit.     Allergies  Allergen Reactions  . Bee Venom Anaphylaxis  . Cinnamon Anaphylaxis  . Doxycycline Hives  . Ibuprofen Other (See Comments)    Abdominal pain  . Lexapro [Escitalopram Oxalate] Other (See Comments)    Generalized body shaking. ? sz  . Tape Hives, Rash and Other (See Comments)    EKG STICKERS    Family History  Problem Relation Age of Onset  . Hypothyroidism Mother   . Diabetes Father   . Diabetes Paternal Uncle   . Hypertension Maternal Grandmother   . Hypertension Maternal Grandfather   . Pancreatic cancer Maternal Grandfather   . Hypertension Paternal Grandmother   . Heart disease Paternal Grandfather   . Hypertension Paternal Grandfather     BP 118/82 (BP Location: Right Wrist)   Pulse 100   Ht _0  (1.626 m)   Wt (!) 394 lb (178.7 kg)   LMP 04/08/2018 (Exact Date)   SpO2 95% Comment: on 2L  BMI 67.63 kg/m     Review of Systems denies weight loss, blurry vision, headache, chest pain, n/v, muscle  cramps, memory loss, cold intolerance, rhinorrhea, and easy bruising.  She has dry mouth.  Has chronic sob, depression, night sweats, leg cramps, frequent urination, and chronic chest pain.       Objective:   Physical Exam VS: see vs page GEN: no distress.  In wheelchair.  Has 02 on.  Morbid obesity.  HEAD: head: no deformity eyes: no periorbital swelling, no proptosis external nose and ears are normal mouth: no lesion seen NECK: supple, thyroid is not enlarged CHEST WALL: no deformity LUNGS: clear to auscultation CV: reg rate and rhythm, no murmur ABD: abdomen is soft, nontender.  no hepatosplenomegaly.  not distended.  no hernia MUSCULOSKELETAL: muscle bulk and strength are grossly normal.  no obvious joint swelling.  gait is normal and steady EXTEMITIES: no deformity.  no ulcer on the feet.  feet are of normal color and temp.  1+ bilat leg edema PULSES: dorsalis pedis intact bilat.  no carotid bruit NEURO:  cn 2-12 grossly intact.   readily moves all 4's.  sensation is intact to touch on the feet.   SKIN:  Normal texture and temperature.  No rash or suspicious lesion is visible.   NODES:  None palpable at the neck PSYCH: alert, well-oriented.  Does not appear anxious nor depressed.     Lab Results  Component Value Date   HGBA1C 8.0 (A) 04/25/2018    Lab Results  Component Value Date   CREATININE 0.67 04/10/2018   BUN 14 04/10/2018   NA 139 04/10/2018   K 4.3 04/10/2018   CL 102 04/10/2018   CO2 25  04/10/2018   I have reviewed outside records, and summarized: Pt was noted to have elevated a1c, and referred here.  She also reported fatigue and myalgias      Assessment & Plan:  Insulin-requiring type 2 DM, new to me: she needs increased rx Myalgias: prednisone is affecting a1c, so we'll have to increase meds cautiously  Patient Instructions  good diet and exercise significantly improve the control of your diabetes.  please let me know if you wish to be referred to a  dietician.  high blood sugar is very risky to your health.  you should see an eye doctor and dentist every year.  It is very important to get all recommended vaccinations.  Controlling your blood pressure and cholesterol drastically reduces the damage diabetes does to your body.  Those who smoke should quit.  Please discuss these with your doctor.  check your blood sugar 5 times a day: before the 3 meals, and at bedtime.  also check if you have symptoms of your blood sugar being too high or too low.  please keep a record of the readings and bring it to your next appointment here (or you can bring the meter itself).  You can write it on any piece of paper.  please call us sooner if your blood sugar goes below 70, or if you have a lot of readings over 200.    Please change the diabetes meds to those listed below. Please call or message [place] next week, to tell us how the blood sugar is doing.   Please come back for a follow-up appointment in 2 weeks.  Please see Mickel Baas the same day, to consider a V-G) device, and continuous glucose monitor.

## 2018-04-25 NOTE — Telephone Encounter (Signed)
Spoke with Thereasa Distance with [facility]  They received the CPAP order, but are unable to fulfill the order due to her having the home study  With her Medicaid insurance she will have to have and in lab sleep study Dr Isaiah Serge, please advise if okay to order the in lab study,  thanks

## 2018-04-25 NOTE — Patient Instructions (Addendum)
good diet and exercise significantly improve the control of your diabetes.  please let me know if you wish to be referred to a dietician.  high blood sugar is very risky to your health.  you should see an eye doctor and dentist every year.  It is very important to get all recommended vaccinations.  Controlling your blood pressure and cholesterol drastically reduces the damage diabetes does to your body.  Those who smoke should quit.  Please discuss these with your doctor.  check your blood sugar 5 times a day: before the 3 meals, and at bedtime.  also check if you have symptoms of your blood sugar being too high or too low.  please keep a record of the readings and bring it to your next appointment here (or you can bring the meter itself).  You can write it on any piece of paper.  please call us sooner if your blood sugar goes below 70, or if you have a lot of readings over 200.    Please change the diabetes meds to those listed below. Please call or message [place] next week, to tell us how the blood sugar is doing.   Please come back for a follow-up appointment in 2 weeks.  Please see Vernona Rieger the same day, to consider a V-G) device, and continuous glucose monitor.

## 2018-04-26 NOTE — Telephone Encounter (Signed)
Yes. Split night.

## 2018-04-26 NOTE — Telephone Encounter (Signed)
Split night order placed. Nothing further needed. 

## 2018-04-26 NOTE — Telephone Encounter (Signed)
Ok to order in lab study

## 2018-04-26 NOTE — Telephone Encounter (Signed)
Which type? Split night? Thanks.

## 2018-05-01 ENCOUNTER — Telehealth: Payer: Self-pay | Admitting: Endocrinology

## 2018-05-01 NOTE — Telephone Encounter (Signed)
Pt informed

## 2018-05-01 NOTE — Telephone Encounter (Signed)
Please see PCP for this.

## 2018-05-01 NOTE — Telephone Encounter (Signed)
This condition is appropriate for PCP

## 2018-05-01 NOTE — Telephone Encounter (Signed)
Diabetic ulcers on face and from legs up. Feels like they are getting infected. Please advise.

## 2018-05-01 NOTE — Telephone Encounter (Signed)
Pt stated that she saw her PCP yesterday

## 2018-05-01 NOTE — Telephone Encounter (Signed)
Please advise 

## 2018-05-04 ENCOUNTER — Encounter: Payer: Self-pay | Admitting: Cardiology

## 2018-05-09 ENCOUNTER — Ambulatory Visit (INDEPENDENT_AMBULATORY_CARE_PROVIDER_SITE_OTHER): Payer: Medicaid Other | Admitting: Endocrinology

## 2018-05-09 ENCOUNTER — Encounter: Payer: Self-pay | Admitting: Endocrinology

## 2018-05-09 DIAGNOSIS — IMO0001 Reserved for inherently not codable concepts without codable children: Secondary | ICD-10-CM

## 2018-05-09 DIAGNOSIS — E1165 Type 2 diabetes mellitus with hyperglycemia: Secondary | ICD-10-CM | POA: Diagnosis not present

## 2018-05-09 MED ORDER — INSULIN GLARGINE 100 UNIT/ML ~~LOC~~ SOLN: 50.0000 [IU] | Freq: Every day | SUBCUTANEOUS | 3 refills | Status: DC

## 2018-05-09 MED ORDER — INSULIN ASPART 100 UNIT/ML ~~LOC~~ SOLN: SUBCUTANEOUS | 5 refills | Status: DC

## 2018-05-09 NOTE — Patient Instructions (Addendum)
check your blood sugar 5 times a day: before the 3 meals, and at bedtime.  also check if you have symptoms of your blood sugar being too high or too low.  please keep a record of the readings and bring it to your next appointment here (or you can bring the meter itself).  You can write it on any piece of paper.  please call us sooner if your blood sugar goes below 70, or if you have a lot of readings over 200.    Please change the diabetes meds to those listed below. Please call or message [place] next week, to tell us how the blood sugar is doing.   Please come back for a follow-up appointment in 6 weeks.  Please see Courtney Zamora soon, to consider a V-GO device, and continuous glucose monitor.

## 2018-05-09 NOTE — Progress Notes (Signed)
Subjective:    Patient ID: Courtney Zamora, female    DOB: [birth year], 32 y.o.   MRN: [id number]  HPI Pt returns for f/u of diabetes mellitus: DM type: Insulin-requiring type 2.   Dx'ed: 9038 Complications: none Therapy: insulin since dx GDM: never DKA: never Severe hypoglycemia: never Pancreatitis: never Pancreatic imaging: never. Other: she takes multiple daily injections; she takes intermittent steroids for asthma Interval history: Meter is downloaded today, and the printout is scanned into the record.  It varies from 50-400.  It is in general higher as the day goes on, but not necessarily so.  She is off prednisone now.  She checks 1-5 times per day.  She says she has another meter, which she also uses.   Past Medical History:  Diagnosis Date  . Anxiety   . Asthma   . Chronic headache    "1-2/week" (04/09/2018)  . Chronic lower back pain   . Depression   . Essential hypertension   . Family history of adverse reaction to anesthesia    "mom would get PONV" (04/09/2018)  . GERD (gastroesophageal reflux disease)   . Manic state ([place])   . Migraine    "a few/year" (04/09/2018)  . Obesity   . OSA on CPAP   . Osteoarthritis, knee   . Pneumonia    "this is my 2nd time" (04/09/2018)  . Prolonged QT syndrome   . PTSD (post-traumatic stress disorder)   . Rheumatoid arthritis ([place])   . Seizures ([place])    "stress induced; 1 in this past year" (04/09/2018)  . Severe needle phobia   . Type II diabetes mellitus ([place])     Past Surgical History:  Procedure Laterality Date  . TONSILLECTOMY  1994    Social History   Socioeconomic History  . Marital status: Married    Spouse name: Not on file  . Number of children: Not on file  . Years of education: Not on file  . Highest education level: Not on file  Occupational History  . Not on file  Social Needs  . Financial resource strain: Not on file  . Food insecurity:    Worry: Not on file    Inability: Not on file  .  Transportation needs:    Medical: Not on file    Non-medical: Not on file  Tobacco Use  . Smoking status: Never Smoker  . Smokeless tobacco: Never Used  Substance and Sexual Activity  . Alcohol use: Not Currently  . Drug use: Never  . Sexual activity: Not Currently    Birth control/protection: None  Lifestyle  . Physical activity:    Days per week: Not on file    Minutes per session: Not on file  . Stress: Not on file  Relationships  . Social connections:    Talks on phone: Not on file    Gets together: Not on file    Attends religious service: Not on file    Active member of club or organization: Not on file    Attends meetings of clubs or organizations: Not on file    Relationship status: Not on file  . Intimate partner violence:    Fear of current or ex partner: Not on file    Emotionally abused: Not on file    Physically abused: Not on file    Forced sexual activity: Not on file  Other Topics Concern  . Not on file  Social History Narrative  . Not on file  Current Outpatient Medications on File Prior to Visit  Medication Sig Dispense Refill  . ACCU-CHEK SOFTCLIX LANCETS lancets UAD UP TO TID TO CHECK BLOOD SUGAR 100 each 12  . albuterol (PROVENTIL HFA;VENTOLIN HFA) 108 (90 Base) MCG/ACT inhaler Inhale 2 puffs into the lungs every 6 (six) hours as needed for wheezing or shortness of breath (wheezing). For shortness of breath. 1 Inhaler 11  . albuterol (PROVENTIL) (2.5 MG/3ML) 0.083% nebulizer solution Take 3 mLs (2.5 mg total) by nebulization every 6 (six) hours as needed for wheezing or shortness of breath. 150 mL 1  . atenolol (TENORMIN) 25 MG tablet Take 0.5 tablets (12.5 mg total) by mouth daily. 30 tablet 3  . BELBUCA 450 MCG FILM PLACE 1 FILM INSIDE CHEEKS EVERY 12 HOURS  0  . Biotin w/ Vitamins C & E (HAIR/SKIN/NAILS PO) Take 1 tablet by mouth daily.    . Blood Glucose Monitoring Suppl (ACCU-CHEK AVIVA PLUS) w/Device KIT UAD UP TO TID TO TEST BLOOD SUGAR 1 kit 0   . Calcium Citrate 200 MG TABS Take 2 tablets (400 mg total) by mouth every other day. (Patient taking differently: Take 200 mg by mouth daily. )    . cetirizine (ZYRTEC) 10 MG tablet TAKE 1 TABLET BY MOUTH EVERY NIGHT AT BEDTIME (Patient taking differently: Take 10 mg by mouth at bedtime. ) 30 tablet 0  . DULoxetine (CYMBALTA) 30 MG capsule TAKE 1 CAPSULE BY MOUTH TWICE DAILY (Patient taking differently: Take 30 mg by mouth 2 (two) times daily. ) 60 capsule 2  . etanercept (ENBREL SURECLICK) 50 MG/ML injection Inject 50 mg as directed once a week.     . furosemide (LASIX) 20 MG tablet TAKE 1 TABLET BY MOUTH EVERY OTHER DAY (Patient taking differently: Take 20 mg by mouth every other day. ) 30 tablet 3  . gabapentin (NEURONTIN) 300 MG capsule TAKE 1 CAPSULE BY MOUTH EVERY MORNING, 1 CAPSULE BY MOUTH AT NOON, AND 2 CAPSULES BY MOUTH AT BEDTIME (Patient taking differently: Take 300-600 mg by mouth See admin instructions. TAKE 1 CAPSULE BY MOUTH EVERY MORNING, 1 CAPSULE BY MOUTH AT NOON, AND 2 CAPSULES BY MOUTH AT BEDTIME) 120 capsule 5  . glucose blood (ACCU-CHEK AVIVA PLUS) test strip UAD UP TO TID TO CHECK BLOOD SUGAR 100 each 12  . hydrochlorothiazide (HYDRODIURIL) 25 MG tablet Take 1 tablet (25 mg total) by mouth daily. 90 tablet 3  . hydrOXYzine (ATARAX/VISTARIL) 10 MG tablet Take 1 tablet (10 mg total) by mouth 3 (three) times daily as needed. 30 tablet 3  . Insulin Pen Needle (PEN NEEDLES) 31G X 8 MM MISC Use as directed 100 each 3  . ipratropium-albuterol (DUONEB) 0.5-2.5 (3) MG/3ML SOLN Take 3 mLs by nebulization every 6 (six) hours as needed. (Patient taking differently: Take 3 mLs by nebulization every 4 (four) hours as needed (for shortness of breath or wheezing). ) 360 mL 5  . liraglutide (VICTOZA) 18 MG/3ML SOPN Inject 0.3 mLs (1.8 mg total) into the skin daily. 3 mL 6  . montelukast (SINGULAIR) 10 MG tablet Take 1 tablet (10 mg total) by mouth daily. 30 tablet 5  . Multiple  Vitamins-Minerals (WOMENS MULTI) CAPS Take 1 capsule by mouth daily.    . pantoprazole (PROTONIX) 40 MG tablet Take 1 tablet (40 mg total) by mouth 2 (two) times daily before a meal. 60 tablet 6  . promethazine (PHENERGAN) 25 MG tablet Take 1 tablet (25 mg total) by mouth every 8 (eight) hours as needed  for nausea or vomiting. 30 tablet 2  . Respiratory Therapy Supplies (FLUTTER) DEVI As directed. 1 each 0  . sitaGLIPtin (JANUVIA) 50 MG tablet Take 1 tablet (50 mg total) by mouth daily. 30 tablet 6  . Spacer/Aero-Holding Chambers (AEROCHAMBER MV) inhaler Use as instructed 1 each 0  . tiZANidine (ZANAFLEX) 4 MG tablet TAKE 1 TABLET(4 MG) BY MOUTH EVERY 6 HOURS AS NEEDED FOR MUSCLE SPASMS 60 tablet 0  . Vitamin D, Ergocalciferol, (DRISDOL) 50000 units CAPS capsule Take 50,000 Units by mouth once a week.  3   No current facility-administered medications on file prior to visit.     Allergies  Allergen Reactions  . Bee Venom Anaphylaxis  . Cinnamon Anaphylaxis  . Doxycycline Hives  . Ibuprofen Other (See Comments)    Abdominal pain  . Lexapro [Escitalopram Oxalate] Other (See Comments)    Generalized body shaking. ? sz  . Tape Hives, Rash and Other (See Comments)    EKG STICKERS    Family History  Problem Relation Age of Onset  . Hypothyroidism Mother   . Diabetes Father   . Diabetes Paternal Uncle   . Hypertension Maternal Grandmother   . Hypertension Maternal Grandfather   . Pancreatic cancer Maternal Grandfather   . Hypertension Paternal Grandmother   . Heart disease Paternal Grandfather   . Hypertension Paternal Grandfather     BP 110/60   Pulse 88   Ht '5\' 4"'$  (1.626 m)   Wt (!) 398 lb (180.5 kg)   SpO2 96%   BMI 68.32 kg/m    Review of Systems Denies LOC.     Objective:   Physical Exam VS: see vs page GEN: no distress.  In wheelchair.  Has 02 on.  Morbid obesity.  Pulses: dorsalis pedis intact bilat.   MSK: no deformity of the feet CV: 1+ bilat leg edema Skin:   no ulcer on the feet.  normal color and temp on the feet. Neuro: sensation is intact to touch on the feet Ext: There is bilateral onychomycosis of the toenails      Assessment & Plan:  Insulin-requiring type 2 DM: Based on the pattern of her cbg's, she needs some adjustment in her therapy   Patient Instructions  check your blood sugar 5 times a day: before the 3 meals, and at bedtime.  also check if you have symptoms of your blood sugar being too high or too low.  please keep a record of the readings and bring it to your next appointment here (or you can bring the meter itself).  You can write it on any piece of paper.  please call us sooner if your blood sugar goes below 70, or if you have a lot of readings over 200.    Please change the diabetes meds to those listed below. Please call or message [place] next week, to tell us how the blood sugar is doing.   Please come back for a follow-up appointment in 6 weeks.  Please see Mickel Baas soon, to consider a V-GO device, and continuous glucose monitor.

## 2018-05-14 ENCOUNTER — Ambulatory Visit: Payer: Self-pay | Admitting: Internal Medicine

## 2018-05-17 ENCOUNTER — Ambulatory Visit: Payer: Medicaid Other | Admitting: Obstetrics and Gynecology

## 2018-05-17 ENCOUNTER — Encounter: Payer: Self-pay | Admitting: Obstetrics and Gynecology

## 2018-05-17 DIAGNOSIS — Z3009 Encounter for other general counseling and advice on contraception: Secondary | ICD-10-CM

## 2018-05-17 NOTE — Patient Instructions (Signed)
Levonorgestrel intrauterine device (IUD) What is this medicine? LEVONORGESTREL IUD (LEE voe nor jes trel) is a contraceptive (birth control) device. The device is placed inside the uterus by a healthcare professional. It is used to prevent pregnancy. This device can also be used to treat heavy bleeding that occurs during your period. This medicine may be used for other purposes; ask your health care provider or pharmacist if you have questions. COMMON BRAND NAME(S): Kyleena, LILETTA, Mirena, Skyla What should I tell my health care provider before I take this medicine? They need to know if you have any of these conditions: -abnormal Pap smear -cancer of the breast, uterus, or cervix -diabetes -endometritis -genital or pelvic infection now or in the past -have more than one sexual partner or your partner has more than one partner -heart disease -history of an ectopic or tubal pregnancy -immune system problems -IUD in place -liver disease or tumor -problems with blood clots or take blood-thinners -seizures -use intravenous drugs -uterus of unusual shape -vaginal bleeding that has not been explained -an unusual or allergic reaction to levonorgestrel, other hormones, silicone, or polyethylene, medicines, foods, dyes, or preservatives -pregnant or trying to get pregnant -breast-feeding How should I use this medicine? This device is placed inside the uterus by a health care professional. Talk to your pediatrician regarding the use of this medicine in children. Special care may be needed. Overdosage: If you think you have taken too much of this medicine contact a poison control center or emergency room at once. NOTE: This medicine is only for you. Do not share this medicine with others. What if I miss a dose? This does not apply. Depending on the brand of device you have inserted, the device will need to be replaced every 3 to 5 years if you wish to continue using this type of birth  control. What may interact with this medicine? Do not take this medicine with any of the following medications: -amprenavir -bosentan -fosamprenavir This medicine may also interact with the following medications: -aprepitant -armodafinil -barbiturate medicines for inducing sleep or treating seizures -bexarotene -boceprevir -griseofulvin -medicines to treat seizures like carbamazepine, ethotoin, felbamate, oxcarbazepine, phenytoin, topiramate -modafinil -pioglitazone -rifabutin -rifampin -rifapentine -some medicines to treat HIV infection like atazanavir, efavirenz, indinavir, lopinavir, nelfinavir, tipranavir, ritonavir -St. John's wort -warfarin This list may not describe all possible interactions. Give your health care provider a list of all the medicines, herbs, non-prescription drugs, or dietary supplements you use. Also tell them if you smoke, drink alcohol, or use illegal drugs. Some items may interact with your medicine. What should I watch for while using this medicine? Visit your doctor or health care professional for regular check ups. See your doctor if you or your partner has sexual contact with others, becomes HIV positive, or gets a sexual transmitted disease. This product does not protect you against HIV infection (AIDS) or other sexually transmitted diseases. You can check the placement of the IUD yourself by reaching up to the top of your vagina with clean fingers to feel the threads. Do not pull on the threads. It is a good habit to check placement after each menstrual period. Call your doctor right away if you feel more of the IUD than just the threads or if you cannot feel the threads at all. The IUD may come out by itself. You may become pregnant if the device comes out. If you notice that the IUD has come out use a backup birth control method like condoms and call your   health care provider. Using tampons will not change the position of the IUD and are okay to use  during your period. This IUD can be safely scanned with magnetic resonance imaging (MRI) only under specific conditions. Before you have an MRI, tell your healthcare provider that you have an IUD in place, and which type of IUD you have in place. What side effects may I notice from receiving this medicine? Side effects that you should report to your doctor or health care professional as soon as possible: -allergic reactions like skin rash, itching or hives, swelling of the face, lips, or tongue -fever, flu-like symptoms -genital sores -high blood pressure -no menstrual period for 6 weeks during use -pain, swelling, warmth in the leg -pelvic pain or tenderness -severe or sudden headache -signs of pregnancy -stomach cramping -sudden shortness of breath -trouble with balance, talking, or walking -unusual vaginal bleeding, discharge -yellowing of the eyes or skin Side effects that usually do not require medical attention (report to your doctor or health care professional if they continue or are bothersome): -acne -breast pain -change in sex drive or performance -changes in weight -cramping, dizziness, or faintness while the device is being inserted -headache -irregular menstrual bleeding within first 3 to 6 months of use -nausea This list may not describe all possible side effects. Call your doctor for medical advice about side effects. You may report side effects to FDA at 1-800-FDA-1088. Where should I keep my medicine? This does not apply. NOTE: This sheet is a summary. It may not cover all possible information. If you have questions about this medicine, talk to your doctor, pharmacist, or health care provider.  2018 Elsevier/Gold Standard (2016-04-15 14:14:56) IUD PLACEMENT POST-PROCEDURE INSTRUCTIONS  1. You may take Ibuprofen, Aleve or Tylenol for pain if needed.  Cramping should resolve within in 24 hours.  2. You may have a small amount of spotting.  You should wear a mini  pad for the next few days.  3. You may have intercourse after 24 hours.  If you using this for birth control, it is effective immediately.  4. You need to call if you have any pelvic pain, fever, heavy bleeding or foul smelling vaginal discharge.  Irregular bleeding is common the first several months after having an IUD placed. You do not need to call for this reason unless you are concerned.  5. Shower or bathe as normal  6. You should have a follow-up appointment in 4-8 weeks for a re-check to make sure you are not having any problems. 

## 2018-05-17 NOTE — Progress Notes (Signed)
Courtney Zamora presents to discuss her options for contraception. She has multiple medical problems as noted in chart She has H/O irregular cycles probable related to PCOS. Normal U/S last yr  PE AF VSS morbidy obese female O2 dependent in wheelchair  Lungs clear Heart RRR Abd soft + bs obese  A/P Contraception counseling  Follow discussion pt desires progesterone IUD. I think this is a good option for Courtney Leggitt d/t to multiple medical problems R/B/insertion reviewed with pt. Information provided to pt Pt declined to have inserted today Pt instructed to reframe form IC x 2 weeks and schedule appt for IUD insertion.

## 2018-05-21 ENCOUNTER — Ambulatory Visit: Payer: Self-pay | Admitting: Dietician

## 2018-05-21 ENCOUNTER — Telehealth: Payer: Self-pay | Admitting: Endocrinology

## 2018-05-21 NOTE — Telephone Encounter (Signed)
Please increase the novolog to 27 units 3 times a day (just before each meal).  Please continue the same lantus.  Is there any reason you know of, why it has gone high, such as steroid shots or steroids by mouth?

## 2018-05-21 NOTE — Telephone Encounter (Signed)
Patient called stating that her sugars have been 374 since this morning and the lowest today it reached 256. Please Advice with patient.

## 2018-05-21 NOTE — Telephone Encounter (Signed)
Please refer to pt message below and advise how to proceed. Thanks

## 2018-05-22 NOTE — Telephone Encounter (Signed)
Please call pt

## 2018-05-22 NOTE — Telephone Encounter (Signed)
Called pt and informed of new insulin dosage. Advised to continue to monitor CBG's. Call if abnormal. Denies taking any oral or injectable steroids. Is unable to provide any reasoning behind the elevated CBG's.

## 2018-05-23 ENCOUNTER — Telehealth: Payer: Self-pay | Admitting: Endocrinology

## 2018-05-23 ENCOUNTER — Ambulatory Visit: Payer: Self-pay | Admitting: Obstetrics and Gynecology

## 2018-05-23 NOTE — Telephone Encounter (Signed)
Please advise 

## 2018-05-23 NOTE — Telephone Encounter (Signed)
Patient is stating they called on Monday stating her blood sugars where running high and they currently still are. Patient says she is taking her insulin like Dr. Everardo All says to. Ph # [phone]  Patient is stating a bubble has formed where she sticks her fingers. It hurts really bad and it is white. Patient wants to know if she needs to be seen. Please Advice, thanks

## 2018-05-23 NOTE — Telephone Encounter (Signed)
Please advise ov tomorrow 

## 2018-05-24 ENCOUNTER — Encounter: Payer: Self-pay | Admitting: Endocrinology

## 2018-05-24 ENCOUNTER — Ambulatory Visit (INDEPENDENT_AMBULATORY_CARE_PROVIDER_SITE_OTHER): Payer: Medicaid Other | Admitting: Endocrinology

## 2018-05-24 DIAGNOSIS — E1165 Type 2 diabetes mellitus with hyperglycemia: Secondary | ICD-10-CM | POA: Diagnosis not present

## 2018-05-24 DIAGNOSIS — IMO0001 Reserved for inherently not codable concepts without codable children: Secondary | ICD-10-CM

## 2018-05-24 MED ORDER — INSULIN GLARGINE 100 UNIT/ML ~~LOC~~ SOLN: 40.0000 [IU] | Freq: Every day | SUBCUTANEOUS | 3 refills | Status: DC

## 2018-05-24 MED ORDER — INSULIN ASPART 100 UNIT/ML ~~LOC~~ SOLN: 30.0000 [IU] | Freq: Three times a day (TID) | SUBCUTANEOUS | 5 refills | Status: DC

## 2018-05-24 MED ORDER — SITAGLIPTIN PHOSPHATE 100 MG PO TABS: 100.0000 mg | ORAL_TABLET | Freq: Every day | ORAL | 3 refills | Status: DC

## 2018-05-24 MED ORDER — CEPHALEXIN 500 MG PO CAPS: 500.0000 mg | ORAL_CAPSULE | Freq: Three times a day (TID) | ORAL | 0 refills | Status: DC

## 2018-05-24 NOTE — Patient Instructions (Addendum)
check your blood sugar 5 times a day: before the 3 meals, and at bedtime.  also check if you have symptoms of your blood sugar being too high or too low.  please keep a record of the readings and bring it to your next appointment here (or you can bring the meter itself).  You can write it on any piece of paper.  please call us sooner if your blood sugar goes below 70, or if you have a lot of readings over 200.    Please change the insulins to those listed below. Please call or message [place] next week, to tell us how the blood sugar is doing.   Please come back for a follow-up appointment in 2 months.   I have sent a prescription to your pharmacy, for an antibiotic pill.  Please see Vernona Rieger soon, to consider an insulin pump.

## 2018-05-24 NOTE — Telephone Encounter (Signed)
Can you please schedule this pt?

## 2018-05-24 NOTE — Progress Notes (Signed)
   Subjective:    Patient ID: Courtney Zamora, female    DOB: 08-09-1985, 32 y.o.   MRN: [id number]  HPI Pt returns for f/u of diabetes mellitus: DM type: Insulin-requiring type 2.   Dx'ed: 2019 Complications: none.   Therapy: insulin since dx.  GDM: never DKA: never Severe hypoglycemia: never Pancreatitis: never Pancreatic imaging: never. Other: she takes multiple daily injections; she takes intermittent steroids for asthma.  Interval history: continuous glucose monitor is downloaded today, and the printout is scanned into the record.  cbg varies from 40-340.  It is in general higher as the day goes on.  No recent steroids. She is off prednisone now.  She checks 1-5 times per day.  .  She takes lantus 50/d, and novolog, 27 units 3 times a day (just before each meal).  Pt reports 3 days of slight swelling at the left index finger, and assoc pain  .basi Review of Systems she denies hypoglycemia and fever.      Objective:   Physical Exam VITAL SIGNS:  See vs page GENERAL: no distress Left index finger: at the fingerpad area, there is slight erythema and swelling.  No fluctuance or break in the skin.    Lab Results  Component Value Date   CREATININE 0.67 04/10/2018   BUN 14 04/10/2018   NA 139 04/10/2018   K 4.3 04/10/2018   CL 102 04/10/2018   CO2 25 04/10/2018   Lab Results  Component Value Date   HGBA1C 8.0 (A) 04/25/2018       Assessment & Plan:  Insulin-requiring type 2 DM: she needs increased rx Cellulitis of fingerpad, new  Patient Instructions  check your blood sugar 5 times a day: before the 3 meals, and at bedtime.  also check if you have symptoms of your blood sugar being too high or too low.  please keep a record of the readings and bring it to your next appointment here (or you can bring the meter itself).  You can write it on any piece of paper.  please call us sooner if your blood sugar goes below 70, or if you have a lot of readings over 200.    Please  change the insulins to those listed below. Please call or message [place] next week, to tell us how the blood sugar is doing.   Please come back for a follow-up appointment in 2 months.   I have sent a prescription to your pharmacy, for an antibiotic pill.  Please see Vernona Rieger soon, to consider an insulin pump.

## 2018-05-24 NOTE — Telephone Encounter (Signed)
LMTCB about being seen today

## 2018-05-27 ENCOUNTER — Other Ambulatory Visit: Payer: Self-pay | Admitting: Internal Medicine

## 2018-05-28 ENCOUNTER — Other Ambulatory Visit: Payer: Self-pay | Admitting: Internal Medicine

## 2018-05-28 DIAGNOSIS — F0781 Postconcussional syndrome: Secondary | ICD-10-CM

## 2018-05-29 ENCOUNTER — Ambulatory Visit: Payer: Medicaid Other | Admitting: Internal Medicine

## 2018-05-30 ENCOUNTER — Encounter (HOSPITAL_BASED_OUTPATIENT_CLINIC_OR_DEPARTMENT_OTHER): Payer: Self-pay

## 2018-06-04 ENCOUNTER — Other Ambulatory Visit: Payer: Self-pay | Admitting: Internal Medicine

## 2018-06-04 ENCOUNTER — Other Ambulatory Visit: Payer: Self-pay | Admitting: Pulmonary Disease

## 2018-06-04 ENCOUNTER — Ambulatory Visit (INDEPENDENT_AMBULATORY_CARE_PROVIDER_SITE_OTHER): Payer: Self-pay | Admitting: Family Medicine

## 2018-06-06 ENCOUNTER — Other Ambulatory Visit: Payer: Self-pay | Admitting: Pulmonary Disease

## 2018-06-06 ENCOUNTER — Telehealth: Payer: Self-pay

## 2018-06-06 NOTE — Telephone Encounter (Signed)
Received notification from Medicaid re: need to complete CMN for pump, supplies and testing. Request has been placed on Dr. George Hugh desk for completion. Will fax completed document along with office notes once received.

## 2018-06-08 ENCOUNTER — Encounter (INDEPENDENT_AMBULATORY_CARE_PROVIDER_SITE_OTHER): Payer: Self-pay | Admitting: Family Medicine

## 2018-06-08 ENCOUNTER — Ambulatory Visit (INDEPENDENT_AMBULATORY_CARE_PROVIDER_SITE_OTHER): Payer: Medicaid Other | Admitting: Family Medicine

## 2018-06-08 ENCOUNTER — Other Ambulatory Visit: Payer: Self-pay | Admitting: Internal Medicine

## 2018-06-08 DIAGNOSIS — G8929 Other chronic pain: Secondary | ICD-10-CM | POA: Diagnosis not present

## 2018-06-08 DIAGNOSIS — J0110 Acute frontal sinusitis, unspecified: Secondary | ICD-10-CM

## 2018-06-08 DIAGNOSIS — M25561 Pain in right knee: Secondary | ICD-10-CM | POA: Diagnosis not present

## 2018-06-08 MED ORDER — NABUMETONE 500 MG PO TABS: 500.0000 mg | ORAL_TABLET | Freq: Two times a day (BID) | ORAL | 3 refills | Status: DC | PRN

## 2018-06-08 MED ORDER — ETODOLAC 400 MG PO TABS: 400.0000 mg | ORAL_TABLET | Freq: Two times a day (BID) | ORAL | 3 refills | Status: DC | PRN

## 2018-06-08 NOTE — Addendum Note (Signed)
Addended by: Lillia Carmel on: 06/08/2018 01:40 PM   Modules accepted: Orders

## 2018-06-08 NOTE — Progress Notes (Signed)
Office Visit Note   Patient: Courtney Zamora           Date of Birth: 11-13-1985           MRN: [id number] Visit Date: 06/08/2018 Requested by: Virl Axe, FNP [address] [place], [place] [address] PCP: Virl Axe, FNP  Subjective: Chief Complaint  Patient presents with  . Right Knee - Pain    Pain x 1 month, NKI. Feels like a "pinched nerve" in the center of the knee and towards the posterior knee.  She said she has a know Baker's cyst.  Has RA.    HPI: She is a 32 year old with right leg pain.  Long-standing problems with both knees.  She has a history of severe osteoarthritis in her knee as well as degenerative meniscus tears.  She also has rheumatoid arthritis managed by rheumatology with Enbrel.  In the past month or 2 her right knee has been bothering her quite a bit.  She feels like she has a nerve being pinched inside her knee.  She points to the patella area as the location of her pain, it hurts when she bends her knee.  She cannot recall a specific injury.  She is not taking anything for her pain other than her rheumatology.  She was taken off prednisone about 3 weeks ago.  In the past she has taken Celebrex which helps but eventually it stopped working.  She is using Voltaren gel with no improvement.              ROS: She has diabetes which is not fully controlled.  And super morbid obesity.  She is on oxygen chronically.  Objective: Vital Signs: There were no vitals taken for this visit.  Physical Exam:  Right knee: 1-2+ effusion with no warmth or erythema.  Pain with patella compression.  Extensor mechanism is intact.  Tender on the medial joint line, no definite click with McMurray's.  No palpable popliteal cyst.  Imaging: None today.  Assessment & Plan: 1.  Chronic right knee pain with end-stage DJD, now with sharp pain suspicious for meniscus tear or loose body. -We will try Lodine for inflammation.  If still no improvement, then ultrasound-guided  cortisone injection.   Follow-Up Instructions: No follow-ups on file.      Procedures: No procedures performed  No notes on file    PMFS History: Patient Active Problem List   Diagnosis Date Noted  . General counseling and advice for contraceptive management 05/17/2018  . Abnormal CT of the chest 04/10/2018  . History of PCOS 04/09/2018  . DUB (dysfunctional uterine bleeding) 03/02/2018  . Abnormal Pap smear of cervix 03/02/2018  . Rheumatoid arthritis involving multiple sites with positive rheumatoid factor (HCC) 02/27/2018  . Abnormal CT scan, esophagus 09/19/2017  . Dysphagia 09/19/2017  . Elevated LFTs 09/19/2017  . Hepatic steatosis 09/15/2017  . DM2 (diabetes mellitus, type 2) (HCC) 09/06/2017  . Metatarsal stress fracture, right, sequela 11/17/2016  . Neck muscle spasm 10/05/2016  . Nipple discharge in female 09/13/2016  . Depression 08/17/2016  . Anxiety 08/17/2016  . ASCUS with positive high risk HPV cervical 08/14/2016  . Hypertension 08/14/2016  . Long term (current) use of systemic steroids 08/14/2016  . Esophageal reflux 12/29/2015  . Morbid (severe) obesity due to excess calories (HCC) 12/29/2015  . Chronic pain of right knee 12/29/2015  . Chronic pain of left ankle 12/29/2015  . 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 Asthma, moderate persistent 12/09/2015  Past Medical History:  Diagnosis Date  . Anxiety   . Asthma   . Chronic headache    "1-2/week" (04/09/2018)  . Chronic lower back pain   . Depression   . Essential hypertension   . Family history of adverse reaction to anesthesia    "mom would get PONV" (04/09/2018)  . GERD (gastroesophageal reflux disease)   . Manic state (HCC)   . Migraine    "a few/year" (04/09/2018)  . Obesity   . OSA on CPAP   . Osteoarthritis, knee   . Pneumonia    "this is my 2nd time" (04/09/2018)  . Prolonged QT syndrome   . PTSD (post-traumatic stress disorder)   . Rheumatoid arthritis (HCC)   . Seizures (HCC)     "stress induced; 1 in this past year" (04/09/2018)  . Severe needle phobia   . Type II diabetes mellitus (HCC)     Family History  Problem Relation Age of Onset  . Hypothyroidism Mother   . Diabetes Father   . Diabetes Paternal Uncle   . Hypertension Maternal Grandmother   . Hypertension Maternal Grandfather   . Pancreatic cancer Maternal Grandfather   . Hypertension Paternal Grandmother   . Heart disease Paternal Grandfather   . Hypertension Paternal Grandfather     Past Surgical History:  Procedure Laterality Date  . TONSILLECTOMY  1994   Social History   Occupational History  . Not on file  Tobacco Use  . Smoking status: Never Smoker  . Smokeless tobacco: Never Used  Substance and Sexual Activity  . Alcohol use: Not Currently  . Drug use: Never  . Sexual activity: Not Currently    Birth control/protection: None

## 2018-06-12 ENCOUNTER — Other Ambulatory Visit: Payer: Self-pay

## 2018-06-13 ENCOUNTER — Other Ambulatory Visit: Payer: Self-pay | Admitting: Endocrinology

## 2018-06-13 ENCOUNTER — Telehealth: Payer: Self-pay | Admitting: Nutrition

## 2018-06-13 ENCOUNTER — Telehealth: Payer: Self-pay | Admitting: Endocrinology

## 2018-06-13 DIAGNOSIS — E119 Type 2 diabetes mellitus without complications: Secondary | ICD-10-CM

## 2018-06-13 DIAGNOSIS — Z794 Long term (current) use of insulin: Principal | ICD-10-CM

## 2018-06-13 NOTE — Telephone Encounter (Signed)
[facility] ph# [phone] called re: RX request for Inpen (request was sent yesterday). Please call the above ph# for status.

## 2018-06-13 NOTE — Telephone Encounter (Signed)
Documents have been placed on Dr. George Hugh desk for completion. Dr. Everardo All will not return until Monday. Unable to call [facility] to provide additional information until paperwork is completed. Will follow up on Monday once Dr. Everardo All returns to office.

## 2018-06-13 NOTE — Telephone Encounter (Signed)
Message left on machine to call to make a lab appointment for C-Peptide and FBS to see if insurance will pay for an insulin pump

## 2018-06-18 ENCOUNTER — Telehealth: Payer: Self-pay | Admitting: Endocrinology

## 2018-06-18 ENCOUNTER — Other Ambulatory Visit: Payer: Self-pay | Admitting: Internal Medicine

## 2018-06-18 NOTE — Telephone Encounter (Signed)
We have received the fax and it has been placed on Dr. George Hugh desk for review and completion. Will fax documents once completed by Dr. Everardo All.

## 2018-06-18 NOTE — Telephone Encounter (Signed)
[facility] called in regards to wanting to know if we received there fax for this patient. Please Advise, thanks [phone] opinion 2

## 2018-06-19 ENCOUNTER — Other Ambulatory Visit: Payer: Self-pay

## 2018-06-19 ENCOUNTER — Telehealth: Payer: Self-pay | Admitting: Endocrinology

## 2018-06-19 NOTE — Telephone Encounter (Signed)
Called pt to inquire further. States she takes Prednisone 5mg  long term. Has been taking Lantus 40 units at bedtime, Novolog 30 units qd with meals and Victoza 0.63mL's daily. Has had a migraine and dry mouth daily x2 weeks. Has also developed sinus pressure since 06/14/18. CBG's range 280-high and time of day has no relevance to her results. Pt has been advised to stay hydrated and to schedule appt with PCP to address sinus pressure and possible dehydration. Please advise re: if changes are needed for her diabetic medications.

## 2018-06-19 NOTE — Telephone Encounter (Signed)
Patient called Courtney Zamora and stated that her blood sugar level is 443 and she is taking her medication like normal. She noticed sugars have been high since Friday. Pt uses Dexom Glucose monitor. She is weak and dizzy. She is also falling asleep with she is still for any period of time.   Please advise

## 2018-06-19 NOTE — Telephone Encounter (Signed)
Attempted to call pt @ [phone] to inquire further. States phone #is not in service. Wil continue efforts to reach pt.

## 2018-06-19 NOTE — Telephone Encounter (Signed)
Please verify novolog is 30 units 3 times a day (just before each meal).  Then please increase to 40 units 3 times a day (just before each meal).  Please continue the same lantus.

## 2018-06-20 ENCOUNTER — Telehealth: Payer: Self-pay | Admitting: Endocrinology

## 2018-06-20 NOTE — Telephone Encounter (Signed)
Please call pt

## 2018-06-20 NOTE — Telephone Encounter (Signed)
Called pt and informed of Dr. George Hugh orders. Also asked that she call before the weekend to update our office about her CBG's. Verbalized acceptance and understanding.

## 2018-06-20 NOTE — Telephone Encounter (Signed)
Could you see if this is in the faxed folder, please?

## 2018-06-20 NOTE — Telephone Encounter (Signed)
No I did not see anything for her

## 2018-06-20 NOTE — Telephone Encounter (Signed)
[facility] called re: status of a RX request for the Inpen they sent.[facility] Ph# [phone], option 2.

## 2018-06-20 NOTE — Telephone Encounter (Signed)
Called pt to inform of new orders. LVM requesting returned call. 

## 2018-06-20 NOTE — Telephone Encounter (Signed)
Returned call as requested. Informed Rosana Hoes that we have not yet received this order. Verified fax # and asked he resend. Will await documents.

## 2018-06-21 ENCOUNTER — Ambulatory Visit: Payer: Self-pay | Admitting: Endocrinology

## 2018-06-25 ENCOUNTER — Other Ambulatory Visit (INDEPENDENT_AMBULATORY_CARE_PROVIDER_SITE_OTHER): Payer: Self-pay | Admitting: Radiology

## 2018-06-28 ENCOUNTER — Ambulatory Visit: Payer: Self-pay | Admitting: Obstetrics and Gynecology

## 2018-06-29 ENCOUNTER — Telehealth: Payer: Self-pay | Admitting: Endocrinology

## 2018-06-29 ENCOUNTER — Encounter: Payer: Self-pay | Admitting: Dietician

## 2018-06-29 NOTE — Telephone Encounter (Signed)
please call patient: Fasting labs are ordered.  We need results in order to complete form

## 2018-07-03 ENCOUNTER — Other Ambulatory Visit: Payer: Self-pay

## 2018-07-03 NOTE — Telephone Encounter (Signed)
Lab orders signed and returned. Mailed to pt home address with a reminder to ensure pt is fasting before completion.

## 2018-07-03 NOTE — Telephone Encounter (Signed)
Called pt to make her aware. LVM requesting returned call. Lab orders printed and placed on Dr. George Hugh desk for signature. Will mail once completed.

## 2018-07-13 ENCOUNTER — Encounter (HOSPITAL_BASED_OUTPATIENT_CLINIC_OR_DEPARTMENT_OTHER): Payer: Self-pay

## 2018-07-17 ENCOUNTER — Telehealth: Payer: Self-pay

## 2018-07-17 ENCOUNTER — Encounter: Payer: Medicaid Other | Attending: Endocrinology | Admitting: Dietician

## 2018-07-17 ENCOUNTER — Encounter: Payer: Self-pay | Admitting: Dietician

## 2018-07-17 DIAGNOSIS — E119 Type 2 diabetes mellitus without complications: Secondary | ICD-10-CM

## 2018-07-17 DIAGNOSIS — E1165 Type 2 diabetes mellitus with hyperglycemia: Secondary | ICD-10-CM | POA: Diagnosis present

## 2018-07-17 DIAGNOSIS — Z794 Long term (current) use of insulin: Secondary | ICD-10-CM

## 2018-07-17 NOTE — Patient Instructions (Signed)
Practice carbohydrate counting Aim for 30-45 grams of carbohydrate per meal  Aim for 0-15 grams of carbohydrate for a snack if you are hungry.  Treat a low blood sugar (less than 70) with 15 grams of fast acting sugar  1/2 cup of regular soda or juice or 3-4 glucose tabs or 5-6 hard candy that you chew. Recheck your blood sugar in 15 minutes and repeat with 15 grams of fast acting sugar if still low. When your blood sugar is above 70 eat a meal or snack with protein to help stabilize your blood sugar.

## 2018-07-17 NOTE — Telephone Encounter (Signed)
Pt has appt with Oran Rein and Vernona Rieger) called to inform Dr. Everardo All that pt blood suagar was 321 earlier and 307 when call was placed but she state that pt was aware that it was high due to something that she ate. Vernona Rieger stated that she would make sure pt has appt to see you soon.

## 2018-07-17 NOTE — Progress Notes (Signed)
Diabetes Self-Management Education  Visit Type: First/Initial  Appt. Start Time: 1020 Appt. End Time: 1130 Patient arrived 25 minutes late.  07/17/2018  Ms. Courtney Zamora, identified by name and date of birth, is a 32 y.o. female with a diagnosis of Diabetes: Type 2.   ASSESSMENT Patient is here today with her husband.  She would like to learn how to better manage her blood sugars.  She states that she is "deathly afraid of needles" and would prefer an insulin pump. Insurance:  Medicaid She already has the Dexcom CGM.  She went to her PCP who helped her put this on. History includes GERD, HTN, depression (score of 17 today), and RA (attacking eyes, lungs, and joints per patient).  Provided patient with the [facility] crisis line card and recommended counseling.  Number for [facility] provided.  Medications include: Prednisone (chronic dose), Novolog 30 units before breakfast, 25 units before lunch, 30 units before dinner.  She takes this even when she skips meals which is often.  She has 3-4 lows per month. She also takes Lantus 57 units q HS and Victoza q am (complains that her stomach dose not digest foods as well as decreased appetite- but also on Vvanse).  She takes 50,000 units vitamin D for a vitamin D deficiency.   She increased dose of Januvia and started Enbrel at the same time and broke out on her face with acne type spots.  She stopped both of these drugs and spots have gone away. She needs to get the C-peptide and fasting glucose and is to get this at her PCP but has not gotten this done at this time. Patient's sensor glucose was 197 this am and 313 at 10:15 this am and blood glucose of 308.  This is due to 2 bowls of sweetened cereal and 1 candy cane during this visit.   Patient lives with her husband and his parents, dogs, cats, snakes, ferrites and other animals.  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  Reviewed pumps today.  Patient would  prefer the T-slim but will check on medicaid coverage.  The Omnipod DASH is covered by Medicaid. Patient's next appointment is 07/20/18 at 9:00.  Review pump options based on insurance, complete paperwork, review insulin injection and rotation as well as carbohydrate counting, meal choices (avoid cereal) at that time.  Her Dexcom will need to be downloaded.   Diabetes Self-Management Education - 07/17/18 1044      Visit Information   Visit Type  First/Initial      Initial Visit   Diabetes Type  Type 2    Are you currently following a meal plan?  No    Are you taking your medications as prescribed?  Yes    Date Diagnosed  08/2017      Health Coping   How would you rate your overall health?  Very Poor      Psychosocial Assessment   Patient Belief/Attitude about Diabetes  Other (comment)   willing to deal with it and also fearful   Self-care barriers  Debilitated state due to current medical condition    Self-management support  Doctor's office;Family    Other persons present  Patient;Spouse/SO    Patient Concerns  Nutrition/Meal planning;Glycemic Control;Problem Solving;Healthy Lifestyle    Special Needs  None    Preferred Learning Style  No preference indicated    Learning Readiness  Ready    How often do you need to have someone help  you when you read instructions, pamphlets, or other written materials from your doctor or pharmacy?  1 - Never    What is the last grade level you completed in school?  12th grade      Pre-Education Assessment   Patient understands the diabetes disease and treatment process.  Needs Instruction    Patient understands incorporating nutritional management into lifestyle.  Needs Instruction    Patient undertands incorporating physical activity into lifestyle.  Needs Instruction    Patient understands using medications safely.  Needs Instruction    Patient understands monitoring blood glucose, interpreting and using results  Needs Instruction    Patient  understands prevention, detection, and treatment of acute complications.  Needs Instruction    Patient understands prevention, detection, and treatment of chronic complications.  Needs Instruction    Patient understands how to develop strategies to address psychosocial issues.  Needs Instruction    Patient understands how to develop strategies to promote health/change behavior.  Needs Instruction      Complications   Last HgB A1C per patient/outside source  8 %   decreased from 9.6%  08/2017   How often do you check your blood sugar?  > 4 times/day   Has a dexcom CGM   Fasting Blood glucose range (mg/dL)  90-228;406-986;148-307    Postprandial Blood glucose range (mg/dL)  >354   301 currently   Number of hypoglycemic episodes per month  3    Can you tell when your blood sugar is low?  Yes    What do you do if your blood sugar is low?  eat peanut butter balls OR PB snickers mini or poptart snack pack    Number of hyperglycemic episodes per week  1    Can you tell when your blood sugar is high?  Yes    What do you do if your blood sugar is high?  call Dr. George Hugh office when greater than 350    Have you had a dilated eye exam in the past 12 months?  Yes    Have you had a dental exam in the past 12 months?  No   appointment at the end of January   Are you checking your feet?  Yes    How many days per week are you checking your feet?  7      Dietary Intake   Breakfast  2 bowls frosted flakes with marshmallows, whole milk today OR generally SKIPS    Snack (morning)  candy cane today but generally none    Lunch  SKIPS or sandwich or potato OR BBQ today    Snack (afternoon)  occasionally snack bag of potato chips or fruit    Dinner  meat loaf, peas, potatoes    Snack (evening)  none    Beverage(s)  diet soda or water, occasional sweet tea, rare coffee, cream and "a lot of sugar"      Exercise   Exercise Type  ADL's      Patient Education   Previous Diabetes Education  No    Nutrition  management   Carbohydrate counting;Food label reading, portion sizes and measuring food.    Medications  Reviewed patients medication for diabetes, action, purpose, timing of dose and side effects.    Acute complications  Taught treatment of hypoglycemia - the 15 rule.    Psychosocial adjustment  Worked with patient to identify barriers to care and solutions;Identified and addressed patients feelings and concerns about diabetes  Individualized Goals (developed by patient)   Nutrition  Other (comment)   practice carbohydrate counting   Physical Activity  Not Applicable    Medications  take my medication as prescribed    Monitoring   test my blood glucose as discussed    Problem Solving  regular meal schedule    Reducing Risk  examine blood glucose patterns    Health Coping  discuss diabetes with (comment)      Post-Education Assessment   Patient understands the diabetes disease and treatment process.  Needs Review    Patient understands incorporating nutritional management into lifestyle.  Needs Review    Patient undertands incorporating physical activity into lifestyle.  Demonstrates understanding / competency    Patient understands using medications safely.  Demonstrates understanding / competency    Patient understands monitoring blood glucose, interpreting and using results  Demonstrates understanding / competency    Patient understands prevention, detection, and treatment of acute complications.  Needs Review    Patient understands prevention, detection, and treatment of chronic complications.  Needs Instruction    Patient understands how to develop strategies to address psychosocial issues.  Needs Review    Patient understands how to develop strategies to promote health/change behavior.  Needs Review      Outcomes   Expected Outcomes  Demonstrated interest in learning. Expect positive outcomes    Future DMSE  Other (comment)   at the end of the week   Program Status  Completed        Individualized Plan for Diabetes Self-Management Training:   Learning Objective:  Patient will have a greater understanding of diabetes self-management. Patient education plan is to attend individual and/or group sessions per assessed needs and concerns.   Plan:   Patient Instructions  Practice carbohydrate counting Aim for 30-45 grams of carbohydrate per meal  Aim for 0-15 grams of carbohydrate for a snack if you are hungry.  Treat a low blood sugar (less than 70) with 15 grams of fast acting sugar  1/2 cup of regular soda or juice or 3-4 glucose tabs or 5-6 hard candy that you chew. Recheck your blood sugar in 15 minutes and repeat with 15 grams of fast acting sugar if still low. When your blood sugar is above 70 eat a meal or snack with protein to help stabilize your blood sugar.     Expected Outcomes:  Demonstrated interest in learning. Expect positive outcomes  Education material provided: Food label handouts and Meal plan card  If problems or questions, patient to contact team via:  Phone  Future DSME appointment: Other (comment)(at the end of the week)

## 2018-07-19 ENCOUNTER — Telehealth: Payer: Self-pay | Admitting: Dietician

## 2018-07-19 NOTE — Telephone Encounter (Signed)
Brief nutrition note  Called patient to let her know that the Tandem (T-slim) is covered 100% by Medicaid.  Number provided and patient is to call the company to get this started.  Instructed her to call us to set up an appointment with Bonita Quin for training when she gets the pump.  Discussed that carbohydrate counting is very important with an insulin pump and to continue to practice this.  She is to follow up 1/17 with me for carb counting review.  She asked what kind of insulin it required.  Discussed that she will use the short acting only in the pump.  Patient to call for any questions.  Oran Rein, RD, LDN, CDE

## 2018-07-20 ENCOUNTER — Encounter: Payer: Self-pay | Admitting: Dietician

## 2018-07-25 ENCOUNTER — Telehealth: Payer: Self-pay | Admitting: Endocrinology

## 2018-07-25 ENCOUNTER — Ambulatory Visit: Payer: Self-pay | Admitting: Endocrinology

## 2018-07-25 NOTE — Telephone Encounter (Signed)
Needs ov next available appt.   

## 2018-07-25 NOTE — Telephone Encounter (Signed)
Patient no showed today's appt. Please advise on how to follow up. °A. No follow up necessary. °B. Follow up urgent. Contact patient immediately. °C. Follow up necessary. Contact patient and schedule visit in ___ days. °D. Follow up advised. Contact patient and schedule visit in ____weeks. ° °Would you like the NS fee to be applied to this visit? ° °

## 2018-07-25 NOTE — Telephone Encounter (Signed)
Patient has been rescheduled for missed appointment 08/16/18 @ 9:30 a.m.

## 2018-07-29 ENCOUNTER — Other Ambulatory Visit: Payer: Self-pay | Admitting: Internal Medicine

## 2018-08-03 ENCOUNTER — Encounter: Payer: Self-pay | Admitting: Dietician

## 2018-08-08 ENCOUNTER — Telehealth: Payer: Self-pay | Admitting: Nutrition

## 2018-08-08 ENCOUNTER — Telehealth: Payer: Self-pay

## 2018-08-08 NOTE — Telephone Encounter (Signed)
Opened in error

## 2018-08-08 NOTE — Telephone Encounter (Signed)
Attempting to complete CMN for pump. Pt in need of completing FASTING C-Peptide and FASTING glucose. Attempts have been made by Cristy Folks, RN to request pt come in for FASTING labs. Unable to reach pt. Letter mailed today explaining our need. Requested in letter, pt arrive on 08/16/18 FASTING. Failure to do so will result in delay and need for rescheduling of lab appt when FASTING.

## 2018-08-16 ENCOUNTER — Ambulatory Visit: Payer: Self-pay | Admitting: Endocrinology

## 2018-08-17 ENCOUNTER — Encounter (HOSPITAL_BASED_OUTPATIENT_CLINIC_OR_DEPARTMENT_OTHER): Payer: Self-pay

## 2018-08-20 ENCOUNTER — Other Ambulatory Visit: Payer: Self-pay | Admitting: Internal Medicine

## 2018-08-20 DIAGNOSIS — G894 Chronic pain syndrome: Secondary | ICD-10-CM

## 2018-08-29 ENCOUNTER — Encounter: Payer: Self-pay | Admitting: Endocrinology

## 2018-08-29 ENCOUNTER — Ambulatory Visit (INDEPENDENT_AMBULATORY_CARE_PROVIDER_SITE_OTHER): Payer: Medicaid Other | Admitting: Endocrinology

## 2018-08-29 VITALS — BP 134/78 | HR 85 | Ht 64.0 in

## 2018-08-29 DIAGNOSIS — E119 Type 2 diabetes mellitus without complications: Secondary | ICD-10-CM

## 2018-08-29 DIAGNOSIS — Z794 Long term (current) use of insulin: Secondary | ICD-10-CM

## 2018-08-29 DIAGNOSIS — IMO0001 Reserved for inherently not codable concepts without codable children: Secondary | ICD-10-CM

## 2018-08-29 DIAGNOSIS — E1165 Type 2 diabetes mellitus with hyperglycemia: Secondary | ICD-10-CM

## 2018-08-29 LAB — POCT GLYCOSYLATED HEMOGLOBIN (HGB A1C): Hemoglobin A1C: 9.5 % — AB (ref 4.0–5.6)

## 2018-08-29 LAB — BASIC METABOLIC PANEL
BUN: 16 mg/dL (ref 6–23)
CO2: 29 mEq/L (ref 19–32)
Calcium: 9.2 mg/dL (ref 8.4–10.5)
Chloride: 95 mEq/L — ABNORMAL LOW (ref 96–112)
Creatinine, Ser: 0.76 mg/dL (ref 0.40–1.20)
GFR: 87.23 mL/min (ref >60.00)
Glucose, Bld: 176 mg/dL — ABNORMAL HIGH (ref 70–99)
Potassium: 3.6 mEq/L (ref 3.5–5.1)
Sodium: 136 mEq/L (ref 135–145)

## 2018-08-29 MED ORDER — INSULIN ASPART 100 UNIT/ML ~~LOC~~ SOLN: 40.0000 [IU] | Freq: Three times a day (TID) | SUBCUTANEOUS | 5 refills | Status: DC

## 2018-08-29 NOTE — Progress Notes (Signed)
Subjective:    Patient ID: Courtney Zamora, female    DOB: [birth year], 33 y.o.   MRN: [id number]  HPI Pt returns for f/u of diabetes mellitus: DM type: Insulin-requiring type 2.   Dx'ed: 9622 Complications: none.   Therapy: insulin since dx.  GDM: never DKA: never Severe hypoglycemia: never Pancreatitis: never Pancreatic imaging: never. Other: she takes multiple daily injections; she takes intermittent steroids for asthma.  Interval history: continuous glucose monitor is downloaded today, and the printout is scanned into the record.  cbg varies from 68-340.  It is in general higher as the day goes on.  No recent steroids. She is off prednisone now.  She checks 1-5 times per day.   Past Medical History:  Diagnosis Date  . Anxiety   . Asthma   . Chronic headache    "1-2/week" (04/09/2018)  . Chronic lower back pain   . Depression   . Essential hypertension   . Family history of adverse reaction to anesthesia    "mom would get PONV" (04/09/2018)  . GERD (gastroesophageal reflux disease)   . Manic state ([place])   . Migraine    "a few/year" (04/09/2018)  . Obesity   . OSA on CPAP   . Osteoarthritis, knee   . Pneumonia    "this is my 2nd time" (04/09/2018)  . Prolonged QT syndrome   . PTSD (post-traumatic stress disorder)   . Rheumatoid arthritis ([place])   . Seizures ([place])    "stress induced; 1 in this past year" (04/09/2018)  . Severe needle phobia   . Type II diabetes mellitus ([place])     Past Surgical History:  Procedure Laterality Date  . TONSILLECTOMY  1994    Social History   Socioeconomic History  . Marital status: Married    Spouse name: Not on file  . Number of children: Not on file  . Years of education: Not on file  . Highest education level: Not on file  Occupational History  . Not on file  Social Needs  . Financial resource strain: Not on file  . Food insecurity:    Worry: Not on file    Inability: Not on file  . Transportation needs:    Medical: Not  on file    Non-medical: Not on file  Tobacco Use  . Smoking status: Never Smoker  . Smokeless tobacco: Never Used  Substance and Sexual Activity  . Alcohol use: Not Currently  . Drug use: Never  . Sexual activity: Not Currently    Birth control/protection: None  Lifestyle  . Physical activity:    Days per week: Not on file    Minutes per session: Not on file  . Stress: Not on file  Relationships  . Social connections:    Talks on phone: Not on file    Gets together: Not on file    Attends religious service: Not on file    Active member of club or organization: Not on file    Attends meetings of clubs or organizations: Not on file    Relationship status: Not on file  . Intimate partner violence:    Fear of current or ex partner: Not on file    Emotionally abused: Not on file    Physically abused: Not on file    Forced sexual activity: Not on file  Other Topics Concern  . Not on file  Social History Narrative  . Not on file    Current Outpatient Medications on File Prior  to Visit  Medication Sig Dispense Refill  . ACCU-CHEK SOFTCLIX LANCETS lancets UAD UP TO TID TO CHECK BLOOD SUGAR 100 each 12  . acetaminophen-codeine (TYLENOL #3) 300-30 MG tablet Take by mouth every 4 (four) hours as needed for moderate pain.    Marland Kitchen albuterol (PROVENTIL HFA;VENTOLIN HFA) 108 (90 Base) MCG/ACT inhaler Inhale 2 puffs into the lungs every 6 (six) hours as needed for wheezing or shortness of breath (wheezing). For shortness of breath. 1 Inhaler 11  . albuterol (PROVENTIL) (2.5 MG/3ML) 0.083% nebulizer solution Take 3 mLs (2.5 mg total) by nebulization every 6 (six) hours as needed for wheezing or shortness of breath. 150 mL 1  . atenolol (TENORMIN) 25 MG tablet Take 0.5 tablets (12.5 mg total) by mouth daily. 30 tablet 3  . BELBUCA 450 MCG FILM 900 mg.   0  . BELBUCA 900 MCG FILM DISSOLVE 1 FILM INSIDE OF CHEEKS Q 12 HOURS  0  . Biotin w/ Vitamins C & E (HAIR/SKIN/NAILS PO) Take 1 tablet by mouth  daily.    . Blood Glucose Monitoring Suppl (ACCU-CHEK AVIVA PLUS) w/Device KIT UAD UP TO TID TO TEST BLOOD SUGAR 1 kit 0  . Calcium Citrate 200 MG TABS Take 2 tablets (400 mg total) by mouth every other day. (Patient taking differently: Take 200 mg by mouth daily. )    . cetirizine (ZYRTEC) 10 MG tablet TAKE 1 TABLET BY MOUTH EVERY NIGHT AT BEDTIME (Patient taking differently: Take 10 mg by mouth at bedtime. ) 30 tablet 0  . Crisaborole (EUCRISA) 2 % OINT Apply topically.    . DULoxetine (CYMBALTA) 30 MG capsule TAKE 1 CAPSULE BY MOUTH TWICE DAILY (Patient taking differently: Take 30 mg by mouth 2 (two) times daily. ) 60 capsule 2  . etanercept (ENBREL SURECLICK) 50 MG/ML injection Inject 50 mg as directed once a week.     . famotidine (PEPCID) 20 MG tablet TK 1 T PO BID  0  . furosemide (LASIX) 20 MG tablet TAKE 1 TABLET BY MOUTH EVERY OTHER DAY (Patient taking differently: Take 20 mg by mouth every other day. ) 30 tablet 3  . gabapentin (NEURONTIN) 300 MG capsule TAKE 1 CAPSULE BY MOUTH EVERY MORNING, 1 CAPSULE BY MOUTH AT NOON, AND 2 CAPSULES BY MOUTH AT BEDTIME (Patient taking differently: Take 300-600 mg by mouth See admin instructions. TAKE 1 CAPSULE BY MOUTH EVERY MORNING, 1 CAPSULE BY MOUTH AT NOON, AND 2 CAPSULES BY MOUTH AT BEDTIME) 120 capsule 5  . glucose blood (ACCU-CHEK AVIVA PLUS) test strip UAD UP TO TID TO CHECK BLOOD SUGAR 100 each 12  . HUMIRA 40 MG/0.4ML PSKT INJECT 1 PEN UNDER THE SKIN Q 14 DAYS  3  . hydrochlorothiazide (HYDRODIURIL) 25 MG tablet Take 1 tablet (25 mg total) by mouth daily. 90 tablet 3  . hydrOXYzine (ATARAX/VISTARIL) 10 MG tablet TAKE 1 TABLET(10 MG) BY MOUTH THREE TIMES DAILY AS NEEDED 30 tablet 2  . insulin glargine (LANTUS) 100 UNIT/ML injection Inject 0.4 mLs (40 Units total) into the skin at bedtime. 20 mL 3  . Insulin Pen Needle (PEN NEEDLES) 31G X 8 MM MISC Use as directed 100 each 3  . ipratropium-albuterol (DUONEB) 0.5-2.5 (3) MG/3ML SOLN Take 3 mLs by  nebulization every 6 (six) hours as needed. (Patient taking differently: Take 3 mLs by nebulization every 4 (four) hours as needed (for shortness of breath or wheezing). ) 360 mL 5  . liraglutide (VICTOZA) 18 MG/3ML SOPN Inject 0.3 mLs (1.8  mg total) into the skin daily. 3 mL 6  . montelukast (SINGULAIR) 10 MG tablet TAKE 1 TABLET(10 MG) BY MOUTH DAILY 30 tablet 2  . Multiple Vitamins-Minerals (WOMENS MULTI) CAPS Take 1 capsule by mouth daily.    . mycophenolate (CELLCEPT) 500 MG tablet Take 500 mg by mouth 2 (two) times daily.  2  . nabumetone (RELAFEN) 500 MG tablet Take 1 tablet (500 mg total) by mouth 2 (two) times daily as needed. 60 tablet 3  . Omega-3 Fatty Acids (FISH OIL) 1000 MG CAPS Take 1 capsule by mouth daily.    Marland Kitchen oxyCODONE-acetaminophen (PERCOCET/ROXICET) 5-325 MG tablet TK 1 OR 2 TS PO Q  6 H PRN P  0  . pantoprazole (PROTONIX) 40 MG tablet Take 1 tablet (40 mg total) by mouth 2 (two) times daily before a meal. 60 tablet 6  . predniSONE (STERAPRED UNI-PAK 21 TAB) 5 MG (21) TBPK tablet TK AS DIRECTED  0  . promethazine (PHENERGAN) 25 MG tablet Take 1 tablet (25 mg total) by mouth every 8 (eight) hours as needed for nausea or vomiting. 30 tablet 2  . Respiratory Therapy Supplies (FLUTTER) DEVI As directed. 1 each 0  . sitaGLIPtin (JANUVIA) 100 MG tablet Take 100 mg by mouth daily. Take 1 tab po qd    . Spacer/Aero-Holding Chambers (AEROCHAMBER MV) inhaler Use as instructed 1 each 0  . tiZANidine (ZANAFLEX) 4 MG tablet TAKE 1 TABLET(4 MG) BY MOUTH EVERY 6 HOURS AS NEEDED FOR MUSCLE SPASMS 60 tablet 0  . Vitamin D, Ergocalciferol, (DRISDOL) 50000 units CAPS capsule Take 50,000 Units by mouth once a week.  3  . VYVANSE 50 MG capsule Take 50 mg by mouth daily.  0   No current facility-administered medications on file prior to visit.     Allergies  Allergen Reactions  . Bee Venom Anaphylaxis  . Cinnamon Anaphylaxis  . Doxycycline Hives  . Ibuprofen Other (See Comments)     Abdominal pain  . Lexapro [Escitalopram Oxalate] Other (See Comments)    Generalized body shaking. ? sz  . Latex Rash  . Tape Hives, Rash and Other (See Comments)    EKG STICKERS    Family History  Problem Relation Age of Onset  . Hypothyroidism Mother   . Diabetes Father   . Diabetes Paternal Uncle   . Hypertension Maternal Grandmother   . Hypertension Maternal Grandfather   . Pancreatic cancer Maternal Grandfather   . Hypertension Paternal Grandmother   . Heart disease Paternal Grandfather   . Hypertension Paternal Grandfather     BP 134/78 (BP Location: Left Arm, Patient Position: Sitting, Cuff Size: Large)   Pulse 85   Ht '5\' 4"'$  (1.626 m)   SpO2 95%   BMI 68.32 kg/m    Review of Systems She denies hypoglycemia    Objective:   Physical Exam VS: see vs page GEN: no distress.  In wheelchair.  Has 02 on.  Morbid obesity.  Pulses: dorsalis pedis intact bilat.   MSK: no deformity of the feet CV: 1+ bilat leg edema Skin:  no ulcer on the feet.  normal color and temp on the feet. Neuro: sensation is intact to touch on the feet Ext: There is bilateral onychomycosis of the toenails.     A1c=9.5%    Assessment & Plan:  Insulin-requiring type 2 DM: worse Edema: this limits rx options Hypoglycemia: mild, but this limits aggressiveness of glycemic control.   Patient Instructions  check your blood sugar 5 times  a day: before the 3 meals, and at bedtime.  also check if you have symptoms of your blood sugar being too high or too low.  please keep a record of the readings and bring it to your next appointment here (or you can bring the meter itself).  You can write it on any piece of paper.  please call us sooner if your blood sugar goes below 70, or if you have a lot of readings over 200.    Please increase the novolog to 40 units 3 times a day (just before each meal), and:  Please continue the same lantus.  Please call or message [place] next week, to tell us how the blood sugar  is doing.   Please come back for a follow-up appointment in 2 months.

## 2018-08-29 NOTE — Patient Instructions (Addendum)
check your blood sugar 5 times a day: before the 3 meals, and at bedtime.  also check if you have symptoms of your blood sugar being too high or too low.  please keep a record of the readings and bring it to your next appointment here (or you can bring the meter itself).  You can write it on any piece of paper.  please call us sooner if your blood sugar goes below 70, or if you have a lot of readings over 200.    Please increase the novolog to 40 units 3 times a day (just before each meal), and:  Please continue the same lantus.  Please call or message [place] next week, to tell us how the blood sugar is doing.   Please come back for a follow-up appointment in 2 months.

## 2018-08-31 ENCOUNTER — Encounter: Payer: Self-pay | Admitting: Dietician

## 2018-08-31 LAB — C-PEPTIDE: C-Peptide: 10.31 ng/mL — ABNORMAL HIGH (ref 0.80–3.85)

## 2018-09-04 ENCOUNTER — Ambulatory Visit: Payer: Self-pay | Admitting: Pulmonary Disease

## 2018-09-11 ENCOUNTER — Telehealth: Payer: Self-pay | Admitting: Endocrinology

## 2018-09-11 NOTE — Telephone Encounter (Signed)
Called pt and left detailed message informing her to schedule an appt ASAP. Will require evaluation of the site. Site unseen, cannot provide advice over the phone.

## 2018-09-11 NOTE — Telephone Encounter (Signed)
Patient called [facility] stating that she has this particular CGM for a week and the site is feeling hot, no redness, was feeling some pain in the area yesterday and today as well Pain level 3/10  Please advise

## 2018-09-21 ENCOUNTER — Ambulatory Visit (HOSPITAL_BASED_OUTPATIENT_CLINIC_OR_DEPARTMENT_OTHER): Payer: Medicaid Other | Attending: Pulmonary Disease

## 2018-09-24 ENCOUNTER — Encounter: Payer: Self-pay | Admitting: Dietician

## 2018-10-18 ENCOUNTER — Other Ambulatory Visit: Payer: Self-pay | Admitting: Internal Medicine

## 2018-10-30 ENCOUNTER — Telehealth: Payer: Self-pay | Admitting: Adult Health

## 2018-10-30 NOTE — Telephone Encounter (Signed)
Called pt  LVM.

## 2018-11-02 ENCOUNTER — Encounter: Payer: Self-pay | Admitting: Endocrinology

## 2018-11-05 ENCOUNTER — Other Ambulatory Visit: Payer: Self-pay

## 2018-11-05 ENCOUNTER — Other Ambulatory Visit: Payer: Self-pay | Admitting: Internal Medicine

## 2018-11-05 ENCOUNTER — Ambulatory Visit (INDEPENDENT_AMBULATORY_CARE_PROVIDER_SITE_OTHER): Payer: Medicaid Other | Admitting: Endocrinology

## 2018-11-05 DIAGNOSIS — E1165 Type 2 diabetes mellitus with hyperglycemia: Secondary | ICD-10-CM | POA: Diagnosis not present

## 2018-11-05 DIAGNOSIS — Z794 Long term (current) use of insulin: Secondary | ICD-10-CM

## 2018-11-05 DIAGNOSIS — I10 Essential (primary) hypertension: Secondary | ICD-10-CM

## 2018-11-05 DIAGNOSIS — E119 Type 2 diabetes mellitus without complications: Secondary | ICD-10-CM

## 2018-11-05 DIAGNOSIS — IMO0001 Reserved for inherently not codable concepts without codable children: Secondary | ICD-10-CM

## 2018-11-05 MED ORDER — INSULIN GLARGINE 100 UNIT/ML ~~LOC~~ SOLN: 35.0000 [IU] | Freq: Every day | SUBCUTANEOUS | 3 refills | Status: DC

## 2018-11-05 MED ORDER — INSULIN ASPART 100 UNIT/ML ~~LOC~~ SOLN: 45.0000 [IU] | Freq: Three times a day (TID) | SUBCUTANEOUS | 5 refills | Status: DC

## 2018-11-05 NOTE — Patient Instructions (Addendum)
check your blood sugar 5 times a day: before the 3 meals, and at bedtime.  also check if you have symptoms of your blood sugar being too high or too low.  please keep a record of the readings and bring it to your next appointment here (or you can bring the meter itself).  You can write it on any piece of paper.  please call us sooner if your blood sugar goes below 70, or if you have a lot of readings over 200.    Please increase the novolog to 45 units 3 times a day (just before each meal), and:  Please reduce the lantus to 35 units at bedtime.    Please come in for the a1c test.   I have asked Bonita Quin about your pump paperwork.  We'll let you know.   Please come back for a follow-up appointment in 2 months.

## 2018-11-05 NOTE — Progress Notes (Signed)
Subjective:    Patient ID: Courtney Zamora, female    DOB: November 04, 1985, 33 y.o.   MRN: [id number]  HPI  Pt is here for telehealth visit, via doxy video visit.  Alternatives to telehealth are presented to this patient, and the patient agrees.   Patient is at home, and I am at the office.   Pt returns for f/u of diabetes mellitus: DM type: Insulin-requiring type 2.   Dx'ed: 2019 Complications: none.   Therapy: insulin since dx.  GDM: never DKA: never Severe hypoglycemia: never Pancreatitis: never Pancreatic imaging: never. Other: she takes multiple daily injections; she takes chronic steroids for asthma.  Interval history: She has hypoglycemia approx twice per month.  This happens in the middle of the night.  She says cbg varies from 38-400.  It is highest after eating.  No change in chronic prednisone dosage.   Past Medical History:  Diagnosis Date  . Anxiety   . Asthma   . Chronic headache    "1-2/week" (04/09/2018)  . Chronic lower back pain   . Depression   . Essential hypertension   . Family history of adverse reaction to anesthesia    "mom would get PONV" (04/09/2018)  . GERD (gastroesophageal reflux disease)   . Manic state (HCC)   . Migraine    "a few/year" (04/09/2018)  . Obesity   . OSA on CPAP   . Osteoarthritis, knee   . Pneumonia    "this is my 2nd time" (04/09/2018)  . Prolonged QT syndrome   . PTSD (post-traumatic stress disorder)   . Rheumatoid arthritis (HCC)   . Seizures (HCC)    "stress induced; 1 in this past year" (04/09/2018)  . Severe needle phobia   . Type II diabetes mellitus (HCC)     Past Surgical History:  Procedure Laterality Date  . TONSILLECTOMY  1994    Social History   Socioeconomic History  . Marital status: Married    Spouse name: Not on file  . Number of children: Not on file  . Years of education: Not on file  . Highest education level: Not on file  Occupational History  . Not on file  Social Needs  . Financial resource  strain: Not on file  . Food insecurity:    Worry: Not on file    Inability: Not on file  . Transportation needs:    Medical: Not on file    Non-medical: Not on file  Tobacco Use  . Smoking status: Never Smoker  . Smokeless tobacco: Never Used  Substance and Sexual Activity  . Alcohol use: Not Currently  . Drug use: Never  . Sexual activity: Not Currently    Birth control/protection: None  Lifestyle  . Physical activity:    Days per week: Not on file    Minutes per session: Not on file  . Stress: Not on file  Relationships  . Social connections:    Talks on phone: Not on file    Gets together: Not on file    Attends religious service: Not on file    Active member of club or organization: Not on file    Attends meetings of clubs or organizations: Not on file    Relationship status: Not on file  . Intimate partner violence:    Fear of current or ex partner: Not on file    Emotionally abused: Not on file    Physically abused: Not on file    Forced sexual activity: Not on  file  Other Topics Concern  . Not on file  Social History Narrative  . Not on file    Current Outpatient Medications on File Prior to Visit  Medication Sig Dispense Refill  . acetaminophen-codeine (TYLENOL #3) 300-30 MG tablet Take by mouth every 4 (four) hours as needed for moderate pain.    Marland Kitchen albuterol (PROVENTIL HFA;VENTOLIN HFA) 108 (90 Base) MCG/ACT inhaler Inhale 2 puffs into the lungs every 6 (six) hours as needed for wheezing or shortness of breath (wheezing). For shortness of breath. 1 Inhaler 11  . albuterol (PROVENTIL) (2.5 MG/3ML) 0.083% nebulizer solution Take 3 mLs (2.5 mg total) by nebulization every 6 (six) hours as needed for wheezing or shortness of breath. 150 mL 1  . atenolol (TENORMIN) 25 MG tablet Take 0.5 tablets (12.5 mg total) by mouth daily. 30 tablet 3  . BELBUCA 450 MCG FILM 900 mg.   0  . BELBUCA 900 MCG FILM DISSOLVE 1 FILM INSIDE OF CHEEKS Q 12 HOURS  0  . Biotin w/ Vitamins C  & E (HAIR/SKIN/NAILS PO) Take 1 tablet by mouth daily.    . Calcium Citrate 200 MG TABS Take 2 tablets (400 mg total) by mouth every other day. (Patient taking differently: Take 200 mg by mouth daily. )    . cetirizine (ZYRTEC) 10 MG tablet TAKE 1 TABLET BY MOUTH EVERY NIGHT AT BEDTIME (Patient taking differently: Take 10 mg by mouth at bedtime. ) 30 tablet 0  . Crisaborole (EUCRISA) 2 % OINT Apply topically.    . DULoxetine (CYMBALTA) 30 MG capsule TAKE 1 CAPSULE BY MOUTH TWICE DAILY (Patient taking differently: Take 30 mg by mouth 2 (two) times daily. ) 60 capsule 2  . etanercept (ENBREL SURECLICK) 50 MG/ML injection Inject 50 mg as directed once a week.     . famotidine (PEPCID) 20 MG tablet TK 1 T PO BID  0  . furosemide (LASIX) 20 MG tablet TAKE 1 TABLET BY MOUTH EVERY OTHER DAY (Patient taking differently: Take 20 mg by mouth every other day. ) 30 tablet 3  . gabapentin (NEURONTIN) 300 MG capsule TAKE 1 CAPSULE BY MOUTH EVERY MORNING, 1 CAPSULE BY MOUTH AT NOON, AND 2 CAPSULES BY MOUTH AT BEDTIME (Patient taking differently: Take 300-600 mg by mouth See admin instructions. TAKE 1 CAPSULE BY MOUTH EVERY MORNING, 1 CAPSULE BY MOUTH AT NOON, AND 2 CAPSULES BY MOUTH AT BEDTIME) 120 capsule 5  . glucose blood (ACCU-CHEK AVIVA PLUS) test strip UAD UP TO TID TO CHECK BLOOD SUGAR 100 each 12  . HUMIRA 40 MG/0.4ML PSKT INJECT 1 PEN UNDER THE SKIN Q 14 DAYS  3  . hydrochlorothiazide (HYDRODIURIL) 25 MG tablet Take 1 tablet (25 mg total) by mouth daily. 90 tablet 3  . hydrOXYzine (ATARAX/VISTARIL) 10 MG tablet TAKE 1 TABLET(10 MG) BY MOUTH THREE TIMES DAILY AS NEEDED 30 tablet 2  . Insulin Pen Needle (PEN NEEDLES) 31G X 8 MM MISC Use as directed 100 each 3  . ipratropium-albuterol (DUONEB) 0.5-2.5 (3) MG/3ML SOLN Take 3 mLs by nebulization every 6 (six) hours as needed. (Patient taking differently: Take 3 mLs by nebulization every 4 (four) hours as needed (for shortness of breath or wheezing). ) 360 mL 5   . liraglutide (VICTOZA) 18 MG/3ML SOPN Inject 0.3 mLs (1.8 mg total) into the skin daily. 3 mL 6  . montelukast (SINGULAIR) 10 MG tablet TAKE 1 TABLET(10 MG) BY MOUTH DAILY 30 tablet 2  . Multiple Vitamins-Minerals (WOMENS MULTI) CAPS  Take 1 capsule by mouth daily.    . mycophenolate (CELLCEPT) 500 MG tablet Take 500 mg by mouth 2 (two) times daily.  2  . nabumetone (RELAFEN) 500 MG tablet Take 1 tablet (500 mg total) by mouth 2 (two) times daily as needed. 60 tablet 3  . Omega-3 Fatty Acids (FISH OIL) 1000 MG CAPS Take 1 capsule by mouth daily.    Marland Kitchen oxyCODONE-acetaminophen (PERCOCET/ROXICET) 5-325 MG tablet TK 1 OR 2 TS PO Q  6 H PRN P  0  . pantoprazole (PROTONIX) 40 MG tablet Take 1 tablet (40 mg total) by mouth 2 (two) times daily before a meal. 60 tablet 6  . predniSONE (STERAPRED UNI-PAK 21 TAB) 5 MG (21) TBPK tablet TK AS DIRECTED  0  . promethazine (PHENERGAN) 25 MG tablet Take 1 tablet (25 mg total) by mouth every 8 (eight) hours as needed for nausea or vomiting. 30 tablet 2  . Respiratory Therapy Supplies (FLUTTER) DEVI As directed. 1 each 0  . sitaGLIPtin (JANUVIA) 100 MG tablet Take 100 mg by mouth daily. Take 1 tab po qd    . Spacer/Aero-Holding Chambers (AEROCHAMBER MV) inhaler Use as instructed 1 each 0  . tiZANidine (ZANAFLEX) 4 MG tablet TAKE 1 TABLET(4 MG) BY MOUTH EVERY 6 HOURS AS NEEDED FOR MUSCLE SPASMS 60 tablet 0  . Vitamin D, Ergocalciferol, (DRISDOL) 50000 units CAPS capsule Take 50,000 Units by mouth once a week.  3  . VYVANSE 50 MG capsule Take 50 mg by mouth daily.  0   No current facility-administered medications on file prior to visit.     Allergies  Allergen Reactions  . Bee Venom Anaphylaxis  . Cinnamon Anaphylaxis  . Doxycycline Hives  . Ibuprofen Other (See Comments)    Abdominal pain  . Lexapro [Escitalopram Oxalate] Other (See Comments)    Generalized body shaking. ? sz  . Latex Rash  . Tape Hives, Rash and Other (See Comments)    EKG STICKERS     Family History  Problem Relation Age of Onset  . Hypothyroidism Mother   . Diabetes Father   . Diabetes Paternal Uncle   . Hypertension Maternal Grandmother   . Hypertension Maternal Grandfather   . Pancreatic cancer Maternal Grandfather   . Hypertension Paternal Grandmother   . Heart disease Paternal Grandfather   . Hypertension Paternal Grandfather      Review of Systems Denies LOC    Objective:   Physical Exam    Lab Results  Component Value Date   HGBA1C 9.5 (A) 08/29/2018       Assessment & Plan:  Insulin-requiring type 2 DM: Based on the pattern of her cbg's, she needs some adjustment in her therapy.  She declines a1c today.  Hypoglycemia: This limits aggressiveness of glycemic control.  Asthma: is she reduces prednisone, insulin requirement will decrease.   Patient Instructions  check your blood sugar 5 times a day: before the 3 meals, and at bedtime.  also check if you have symptoms of your blood sugar being too high or too low.  please keep a record of the readings and bring it to your next appointment here (or you can bring the meter itself).  You can write it on any piece of paper.  please call us sooner if your blood sugar goes below 70, or if you have a lot of readings over 200.    Please increase the novolog to 45 units 3 times a day (just before each meal), and:  Please reduce the lantus to 35 units at bedtime.    Please come in for the a1c test.   I have asked Bonita Quin about your pump paperwork.  We'll let you know.   Please come back for a follow-up appointment in 2 months.

## 2018-11-06 ENCOUNTER — Ambulatory Visit: Payer: Self-pay

## 2018-11-16 ENCOUNTER — Other Ambulatory Visit: Payer: Self-pay | Admitting: Gastroenterology

## 2018-11-19 ENCOUNTER — Other Ambulatory Visit: Payer: Self-pay | Admitting: Internal Medicine

## 2018-11-23 ENCOUNTER — Encounter: Payer: Self-pay | Admitting: Endocrinology

## 2018-12-03 ENCOUNTER — Ambulatory Visit: Payer: Medicaid Other | Admitting: Acute Care

## 2018-12-03 NOTE — Progress Notes (Deleted)
History of Present Illness Courtney Zamora is a 33 y.o. female never smoker with  history of anxiety, asthma, depression, obesity, degenerative joint disease.She is followed by Dr.Mannam  Synopsis 33 year old with history of anxiety, asthma, depression, obesity, degenerative joint disease.  She has a diagnosis of exercise-induced asthma childhood asthma.  She has difficulties when seasons change.  Sensitive to strong perfumes, cinnamon, cleaning with liquids.  Has complains of dyspnea with activity for the past few months.  She denies dyspnea at rest or wheezing.  No cough, sputum production.  No significant problems with snoring.  Denies daytime sleepiness. She has been on Symbicort for the past 2 years but cannot tell if it is helping.  She has albuterol inhaler which she uses occasionally.  Pets: Has a number of animals at home including snakes, lizards, hedgehogs, ferrets, dogs Occupation: Currently unemployed Exposures: No relevant exposure, no mold at home Smoking history: non smoker Travel History: Not relevant   History: Evaluated by Dr. Allena Katz at [facility] rheumatology in Aug 2018.  Rheumatoid arthritis confirmed with positive serology, erosive changes of joints. She was initially on methotrexate but developed elevated LFTs.  Did not tolerate Plaquenil due to rash, GI upset, possible QT prolongation.  She was started on Enbrel in Aug 2019.  She continues on prednisone at 5 mg  Admitted in September 2019 for respiratory failure with bilateral pulmonary infiltrates on CT scan.  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  Per discharge summary she did not get additional steroids or antibiotics.  She was discharged on 2 L home oxygen. Since her discharge she is followed up with her primary care at [facility] and received 2 courses of antibiotic for persistent abnormality on chest x-ray.  We do not have the report of the x-ray to review. She was  in the process of transferring  pulmonary care to Dr. Eulis Foster, Pulmonary at [facility]. The last interaction with patient was by phone 04/2018 when an in lab sleep study was ordered for evaluation of OSA. Attempts at PFT's were poor due to patient effort.    12/03/2018  Test Results: Imaging CT chest 08/10/17-very mild nonspecific groundglass opacity.  Dilation of pulmonic trunk with possible pulmonary arterial hypertension, hepatic steatosis, nodular densities in the distal third of the esophagus concerning for small polyps. CTA 09/05/17-groundglass opacity with air trapping, severe hepatic steatosis. CTA 11/23/2017- no pulmonary embolism, no acute infiltrate, air-trapping. CTA 04/09/2018- patchy bilateral groundglass opacities, consolidation, hepatic steatosis. I have reviewed the images personally.  PFTs Spirometry 05/01/17 FVC 2.53 [67%], FEV1 2.28 [72%], F/F 90 No obstruction, restriction possible.  PFTs 10/19/17 FVC 2.43 [71%], FEV1 2.20 [76%], F/F 90, TLC 93% DLCO 105%, DLCO/VA 156% Normal spirometry.  No restriction.  Increased diffusion capacity.  FENO 05/01/17-7 FENO 06/19/17- 5 FENO 07/28/17- 7 FENO 08/14/17-5  Sleep PSG 10/02/17 Sleep apnea, CPAP titration-recommended auto CPAP 10-21 cm  Cardiac Echocardiogram 08/17/17 LVEF 60-65%, no diastolic dysfunction.  PA systolic pressure within normal limits.  Labs 10/20/2017-ANA negative, CCP greater than 250, rheumatoid factor greater than 650, ANCA- Negative CBC 04/09/2018-WBC 11.7, eos 2%, absolute eosinophil count 234   CBC Latest Ref Rng & Units 04/10/2018 04/09/2018 01/29/2018  WBC 4.0 - 10.5 K/uL 8.5 11.7(H) 12.1(H)  Hemoglobin 12.0 - 15.0 g/dL 11.8(L) 12.2 11.3(L)  Hematocrit 36.0 - 46.0 % 40.9 42.1 39.1  Platelets 150 - 400 K/uL 317 348 417(H)    BMP Latest Ref Rng & Units 08/29/2018 04/10/2018 04/09/2018  Glucose  70 - 99 mg/dL 409(W176(H) 119(J214(H) 478(G171(H)  BUN 6 - 23 mg/dL 16 14 17   Creatinine 0.40 - 1.20 mg/dL 9.560.76 2.130.67 0.860.90  BUN/Creat  Ratio 9 - 23 - - -  Sodium 135 - 145 mEq/L 136 139 135  Potassium 3.5 - 5.1 mEq/L 3.6 4.3 5.0  Chloride 96 - 112 mEq/L 95(L) 102 97(L)  CO2 19 - 32 mEq/L 29 25 26   Calcium 8.4 - 10.5 mg/dL 9.2 8.9 5.7(Q8.8(L)    BNP    Component Value Date/Time   BNP 23.4 04/09/2018 0545    ProBNP No results found for: PROBNP  PFT    Component Value Date/Time   FEV1PRE 2.18 10/27/2017 1410   FEV1POST 2.20 10/27/2017 1410   FVCPRE 2.40 10/27/2017 1410   FVCPOST 2.43 10/27/2017 1410   TLC 4.29 10/27/2017 1410   DLCOUNC 21.28 10/27/2017 1410   PREFEV1FVCRT 91 10/27/2017 1410   PSTFEV1FVCRT 90 10/27/2017 1410    No results found.   Past medical hx Past Medical History:  Diagnosis Date  . Anxiety   . Asthma   . Chronic headache    "1-2/week" (04/09/2018)  . Chronic lower back pain   . Depression   . Essential hypertension   . Family history of adverse reaction to anesthesia    "mom would get PONV" (04/09/2018)  . GERD (gastroesophageal reflux disease)   . Manic state (HCC)   . Migraine    "a few/year" (04/09/2018)  . Obesity   . OSA on CPAP   . Osteoarthritis, knee   . Pneumonia    "this is my 2nd time" (04/09/2018)  . Prolonged QT syndrome   . PTSD (post-traumatic stress disorder)   . Rheumatoid arthritis (HCC)   . Seizures (HCC)    "stress induced; 1 in this past year" (04/09/2018)  . Severe needle phobia   . Type II diabetes mellitus (HCC)      Social History   Tobacco Use  . Smoking status: Never Smoker  . Smokeless tobacco: Never Used  Substance Use Topics  . Alcohol use: Not Currently  . Drug use: Never    Courtney Zamora reports that she has never smoked. She has never used smokeless tobacco. She reports previous alcohol use. She reports that she does not use drugs.  Tobacco Cessation: Counseling given: Not Answered   Past surgical hx, Family hx, Social hx all reviewed.  Current Outpatient Medications on File Prior to Visit  Medication Sig  . acetaminophen-codeine  (TYLENOL #3) 300-30 MG tablet Take by mouth every 4 (four) hours as needed for moderate pain.  Marland Kitchen. albuterol (PROVENTIL HFA;VENTOLIN HFA) 108 (90 Base) MCG/ACT inhaler Inhale 2 puffs into the lungs every 6 (six) hours as needed for wheezing or shortness of breath (wheezing). For shortness of breath.  Marland Kitchen. albuterol (PROVENTIL) (2.5 MG/3ML) 0.083% nebulizer solution Take 3 mLs (2.5 mg total) by nebulization every 6 (six) hours as needed for wheezing or shortness of breath.  Marland Kitchen. atenolol (TENORMIN) 25 MG tablet Take 0.5 tablets (12.5 mg total) by mouth daily.  Marland Kitchen. BELBUCA 450 MCG FILM 900 mg.   . BELBUCA 900 MCG FILM DISSOLVE 1 FILM INSIDE OF CHEEKS Q 12 HOURS  . Biotin w/ Vitamins C & E (HAIR/SKIN/NAILS PO) Take 1 tablet by mouth daily.  . Calcium Citrate 200 MG TABS Take 2 tablets (400 mg total) by mouth every other day. (Patient taking differently: Take 200 mg by mouth daily. )  . cetirizine (ZYRTEC) 10 MG tablet TAKE 1 TABLET BY  MOUTH EVERY NIGHT AT BEDTIME (Patient taking differently: Take 10 mg by mouth at bedtime. )  . Crisaborole (EUCRISA) 2 % OINT Apply topically.  . DULoxetine (CYMBALTA) 30 MG capsule TAKE 1 CAPSULE BY MOUTH TWICE DAILY (Patient taking differently: Take 30 mg by mouth 2 (two) times daily. )  . etanercept (ENBREL SURECLICK) 50 MG/ML injection Inject 50 mg as directed once a week.   . famotidine (PEPCID) 20 MG tablet TK 1 T PO BID  . furosemide (LASIX) 20 MG tablet TAKE 1 TABLET BY MOUTH EVERY OTHER DAY (Patient taking differently: Take 20 mg by mouth every other day. )  . gabapentin (NEURONTIN) 300 MG capsule TAKE 1 CAPSULE BY MOUTH EVERY MORNING, 1 CAPSULE BY MOUTH AT NOON, AND 2 CAPSULES BY MOUTH AT BEDTIME (Patient taking differently: Take 300-600 mg by mouth See admin instructions. TAKE 1 CAPSULE BY MOUTH EVERY MORNING, 1 CAPSULE BY MOUTH AT NOON, AND 2 CAPSULES BY MOUTH AT BEDTIME)  . glucose blood (ACCU-CHEK AVIVA PLUS) test strip UAD UP TO TID TO CHECK BLOOD SUGAR  . HUMIRA 40  MG/0.4ML PSKT INJECT 1 PEN UNDER THE SKIN Q 14 DAYS  . hydrochlorothiazide (HYDRODIURIL) 25 MG tablet Take 1 tablet (25 mg total) by mouth daily.  . hydrOXYzine (ATARAX/VISTARIL) 10 MG tablet TAKE 1 TABLET(10 MG) BY MOUTH THREE TIMES DAILY AS NEEDED  . insulin aspart (NOVOLOG) 100 UNIT/ML injection Inject 45 Units into the skin 3 (three) times daily with meals.  . insulin glargine (LANTUS) 100 UNIT/ML injection Inject 0.35 mLs (35 Units total) into the skin at bedtime.  . Insulin Pen Needle (PEN NEEDLES) 31G X 8 MM MISC Use as directed  . ipratropium-albuterol (DUONEB) 0.5-2.5 (3) MG/3ML SOLN Take 3 mLs by nebulization every 6 (six) hours as needed. (Patient taking differently: Take 3 mLs by nebulization every 4 (four) hours as needed (for shortness of breath or wheezing). )  . liraglutide (VICTOZA) 18 MG/3ML SOPN Inject 0.3 mLs (1.8 mg total) into the skin daily.  . montelukast (SINGULAIR) 10 MG tablet TAKE 1 TABLET(10 MG) BY MOUTH DAILY  . Multiple Vitamins-Minerals (WOMENS MULTI) CAPS Take 1 capsule by mouth daily.  . mycophenolate (CELLCEPT) 500 MG tablet Take 500 mg by mouth 2 (two) times daily.  . nabumetone (RELAFEN) 500 MG tablet Take 1 tablet (500 mg total) by mouth 2 (two) times daily as needed.  . Omega-3 Fatty Acids (FISH OIL) 1000 MG CAPS Take 1 capsule by mouth daily.  Marland Kitchen oxyCODONE-acetaminophen (PERCOCET/ROXICET) 5-325 MG tablet TK 1 OR 2 TS PO Q  6 H PRN P  . pantoprazole (PROTONIX) 40 MG tablet TAKE 1 TABLET(40 MG) BY MOUTH TWICE DAILY BEFORE A MEAL  . predniSONE (STERAPRED UNI-PAK 21 TAB) 5 MG (21) TBPK tablet TK AS DIRECTED  . promethazine (PHENERGAN) 25 MG tablet Take 1 tablet (25 mg total) by mouth every 8 (eight) hours as needed for nausea or vomiting.  Marland Kitchen Respiratory Therapy Supplies (FLUTTER) DEVI As directed.  . sitaGLIPtin (JANUVIA) 100 MG tablet Take 100 mg by mouth daily. Take 1 tab po qd  . Spacer/Aero-Holding Chambers (AEROCHAMBER MV) inhaler Use as instructed  .  tiZANidine (ZANAFLEX) 4 MG tablet TAKE 1 TABLET(4 MG) BY MOUTH EVERY 6 HOURS AS NEEDED FOR MUSCLE SPASMS  . Vitamin D, Ergocalciferol, (DRISDOL) 50000 units CAPS capsule Take 50,000 Units by mouth once a week.  Marland Kitchen VYVANSE 50 MG capsule Take 50 mg by mouth daily.   No current facility-administered medications on file prior to  visit.      Allergies  Allergen Reactions  . Bee Venom Anaphylaxis  . Cinnamon Anaphylaxis  . Doxycycline Hives  . Ibuprofen Other (See Comments)    Abdominal pain  . Lexapro [Escitalopram Oxalate] Other (See Comments)    Generalized body shaking. ? sz  . Latex Rash  . Tape Hives, Rash and Other (See Comments)    EKG STICKERS    Review Of Systems:  Constitutional:   No  weight loss, night sweats,  Fevers, chills, fatigue, or  lassitude.  HEENT:   No headaches,  Difficulty swallowing,  Tooth/dental problems, or  Sore throat,                No sneezing, itching, ear ache, nasal congestion, post nasal drip,   CV:  No chest pain,  Orthopnea, PND, swelling in lower extremities, anasarca, dizziness, palpitations, syncope.   GI  No heartburn, indigestion, abdominal pain, nausea, vomiting, diarrhea, change in bowel habits, loss of appetite, bloody stools.   Resp: No shortness of breath with exertion or at rest.  No excess mucus, no productive cough,  No non-productive cough,  No coughing up of blood.  No change in color of mucus.  No wheezing.  No chest wall deformity  Skin: no rash or lesions.  GU: no dysuria, change in color of urine, no urgency or frequency.  No flank pain, no hematuria   MS:  No joint pain or swelling.  No decreased range of motion.  No back pain.  Psych:  No change in mood or affect. No depression or anxiety.  No memory loss.   Vital Signs There were no vitals taken for this visit.   Physical Exam:  General- No distress,  A&Ox3 ENT: No sinus tenderness, TM clear, pale nasal mucosa, no oral exudate,no post nasal drip, no LAN Cardiac:  S1, S2, regular rate and rhythm, no murmur Chest: No wheeze/ rales/ dullness; no accessory muscle use, no nasal flaring, no sternal retractions Abd.: Soft Non-tender Ext: No clubbing cyanosis, edema Neuro:  normal strength Skin: No rashes, warm and dry Psych: normal mood and behavior   Assessment/Plan  No problem-specific Assessment & Plan notes found for this encounter.    Bevelyn NgoSarah F Eleyna Brugh, NP 12/03/2018  12:25 PM  Assessment:  Rheumatoid arthritis- ILD Currently on treatment with Enbrel, low-dose prednisone.  She has not tolerated methotrexate or Plaquenil Recent admission noted for bilateral infiltrates, respiratory failure.  Suspect this is secondary to RA ILD flare. We will give her a short prednisone taper starting at 40 mg.  Reduce dose by 10 mg every 3 days.  She will need coordination with rheumatology to optimize immunosuppressive therapy for ILD and may benefit from initiation of azathioprine or CellCept. [facility]  We will be available as needed going forward  Dyspnea  She has a diagnosis of childhood asthma and exercise-induced asthma.  However the symptoms are not really typical and FENO is low, PFTs does not show any obstruction. Continue albuterol and DuoNebs as needed.  Sleep apnea Reorder CPAP  Plan/Recommendations: - Prednisone taper - Continue albuterol, duo nebs - CPAP for sleep apnea

## 2018-12-10 ENCOUNTER — Other Ambulatory Visit: Payer: Self-pay | Admitting: Internal Medicine

## 2018-12-10 DIAGNOSIS — IMO0001 Reserved for inherently not codable concepts without codable children: Secondary | ICD-10-CM

## 2018-12-14 ENCOUNTER — Telehealth: Payer: Self-pay

## 2018-12-14 NOTE — Telephone Encounter (Signed)
Incoming fax request from [facility] for pantoprazole 40 mg BID. Last seen 01-2018. No current follow up. Please advise

## 2018-12-17 NOTE — Telephone Encounter (Signed)
Her only visit here was in March 2019, and she was unable to come for a planned upper endoscopy after that.  Please set her up for telemedicine before renewing this prescription.  I believe I have times available this week.

## 2018-12-18 NOTE — Telephone Encounter (Signed)
Left a message for return call.  

## 2018-12-25 NOTE — Telephone Encounter (Signed)
Called and left message for pt to call back to schedule an appt 

## 2018-12-26 ENCOUNTER — Emergency Department (HOSPITAL_COMMUNITY)
Admission: EM | Admit: 2018-12-26 | Discharge: 2018-12-26 | Disposition: A | Discharge status: Home / Self Care | Payer: Medicaid Other | Attending: Emergency Medicine | Admitting: Emergency Medicine

## 2018-12-26 ENCOUNTER — Other Ambulatory Visit: Payer: Self-pay

## 2018-12-26 ENCOUNTER — Encounter (HOSPITAL_COMMUNITY): Payer: Self-pay

## 2018-12-26 DIAGNOSIS — Z5321 Procedure and treatment not carried out due to patient leaving prior to being seen by health care provider: Secondary | ICD-10-CM | POA: Insufficient documentation

## 2018-12-26 DIAGNOSIS — R1110 Vomiting, unspecified: Secondary | ICD-10-CM | POA: Insufficient documentation

## 2018-12-26 MED ORDER — SODIUM CHLORIDE 0.9% FLUSH: 3.0000 mL | Freq: Once | INTRAVENOUS | Status: DC

## 2018-12-26 MED ORDER — ONDANSETRON 4 MG PO TBDP
4.0000 mg | ORAL_TABLET | Freq: Once | ORAL | Status: AC | PRN
  Administered 2018-12-26: 4 mg via ORAL
  Filled 2018-12-26: qty 1

## 2018-12-26 NOTE — Telephone Encounter (Signed)
You have made sufficient efforts to contact her.  The patient can call us when she feels ready to resume care.

## 2018-12-26 NOTE — ED Triage Notes (Signed)
Patient c/o lower abdominal pain and emesis x 4 days. Patient denies any diarrhea.  Patient states she is on home O2 3L/min vis [place] at home.

## 2018-12-26 NOTE — ED Notes (Signed)
Pt sitting in room off her oxygen, pt getting up and coming to doorway asking for ice chips, pt advised staff cant give anything to eat and drink at this time.

## 2018-12-26 NOTE — Telephone Encounter (Signed)
Patient has not returned any calls or made an appointment.

## 2018-12-26 NOTE — ED Notes (Signed)
Pt sts she wants to leave and that she will follow up with her PCP tomorrow bc she is unable to wait any longer

## 2019-01-16 ENCOUNTER — Ambulatory Visit: Payer: Self-pay | Admitting: Endocrinology

## 2019-01-17 ENCOUNTER — Other Ambulatory Visit: Payer: Self-pay | Admitting: Endocrinology

## 2019-01-17 DIAGNOSIS — IMO0001 Reserved for inherently not codable concepts without codable children: Secondary | ICD-10-CM

## 2019-01-22 ENCOUNTER — Telehealth: Payer: Self-pay | Admitting: Endocrinology

## 2019-01-22 NOTE — Telephone Encounter (Signed)
Per patient she does not believe her insulin is working. She is having to take "100's of units" to get her blood sugar down.  Pharmacy is unable to get her RX refilled.  Scheduled her for an appointment with provider ion 02/01/2019 at 11:00 am

## 2019-01-22 NOTE — Telephone Encounter (Signed)
Please advise how you wish to proceed. Missed last appt and is scheduled 02/01/19

## 2019-01-22 NOTE — Telephone Encounter (Signed)
Please move up appt to this week 

## 2019-01-22 NOTE — Telephone Encounter (Signed)
At Dr. Cordelia Pen request, appt has been rescheduled to 01/23/19. Will address concerns during appt

## 2019-01-23 ENCOUNTER — Ambulatory Visit (INDEPENDENT_AMBULATORY_CARE_PROVIDER_SITE_OTHER): Payer: Medicaid Other | Admitting: Endocrinology

## 2019-01-23 ENCOUNTER — Other Ambulatory Visit: Payer: Self-pay

## 2019-01-23 ENCOUNTER — Encounter: Payer: Self-pay | Admitting: Endocrinology

## 2019-01-23 ENCOUNTER — Telehealth: Payer: Self-pay | Admitting: Endocrinology

## 2019-01-23 VITALS — BP 120/72 | HR 110 | Ht 64.0 in | Wt 389.8 lb

## 2019-01-23 DIAGNOSIS — R1110 Vomiting, unspecified: Secondary | ICD-10-CM | POA: Insufficient documentation

## 2019-01-23 DIAGNOSIS — R112 Nausea with vomiting, unspecified: Secondary | ICD-10-CM | POA: Diagnosis not present

## 2019-01-23 DIAGNOSIS — E1165 Type 2 diabetes mellitus with hyperglycemia: Secondary | ICD-10-CM

## 2019-01-23 DIAGNOSIS — IMO0001 Reserved for inherently not codable concepts without codable children: Secondary | ICD-10-CM

## 2019-01-23 LAB — CBC WITH DIFFERENTIAL/PLATELET
Basophils Absolute: 0.1 10*3/uL (ref 0.0–0.1)
Basophils Relative: 0.5 % (ref 0.0–3.0)
Eosinophils Absolute: 0.1 10*3/uL (ref 0.0–0.7)
Eosinophils Relative: 1.3 % (ref 0.0–5.0)
HCT: 47.6 % — ABNORMAL HIGH (ref 36.0–46.0)
Hemoglobin: 15.2 g/dL — ABNORMAL HIGH (ref 12.0–15.0)
Lymphocytes Relative: 21.6 % (ref 12.0–46.0)
Lymphs Abs: 2.3 10*3/uL (ref 0.7–4.0)
MCHC: 31.9 g/dL (ref 30.0–36.0)
MCV: 91.2 fl (ref 78.0–100.0)
Monocytes Absolute: 0.9 10*3/uL (ref 0.1–1.0)
Monocytes Relative: 8.1 % (ref 3.0–12.0)
Neutro Abs: 7.2 10*3/uL (ref 1.4–7.7)
Neutrophils Relative %: 68.5 % (ref 43.0–77.0)
Platelets: 230 10*3/uL (ref 150.0–400.0)
RBC: 5.21 Mil/uL — ABNORMAL HIGH (ref 3.87–5.11)
RDW: 14.7 % (ref 11.5–15.5)
WBC: 10.6 10*3/uL — ABNORMAL HIGH (ref 4.0–10.5)

## 2019-01-23 LAB — HEPATIC FUNCTION PANEL
ALT: 134 U/L — ABNORMAL HIGH (ref 0–35)
AST: 76 U/L — ABNORMAL HIGH (ref 0–37)
Albumin: 4 g/dL (ref 3.5–5.2)
Alkaline Phosphatase: 143 U/L — ABNORMAL HIGH (ref 39–117)
Bilirubin, Direct: 0.2 mg/dL (ref 0.0–0.3)
Total Bilirubin: 0.8 mg/dL (ref 0.2–1.2)
Total Protein: 7.7 g/dL (ref 6.0–8.3)

## 2019-01-23 LAB — POCT GLYCOSYLATED HEMOGLOBIN (HGB A1C): Hemoglobin A1C: 13.2 % — AB (ref 4.0–5.6)

## 2019-01-23 LAB — BASIC METABOLIC PANEL
BUN: 16 mg/dL (ref 6–23)
CO2: 33 mEq/L — ABNORMAL HIGH (ref 19–32)
Calcium: 10.2 mg/dL (ref 8.4–10.5)
Chloride: 90 mEq/L — ABNORMAL LOW (ref 96–112)
Creatinine, Ser: 0.81 mg/dL (ref 0.40–1.20)
GFR: 80.85 mL/min (ref >60.00)
Glucose, Bld: 190 mg/dL — ABNORMAL HIGH (ref 70–99)
Potassium: 3.1 mEq/L — ABNORMAL LOW (ref 3.5–5.1)
Sodium: 137 mEq/L (ref 135–145)

## 2019-01-23 LAB — AMYLASE: Amylase: 14 U/L — ABNORMAL LOW (ref 27–131)

## 2019-01-23 MED ORDER — INSULIN ASPART 100 UNIT/ML ~~LOC~~ SOLN: 100.0000 [IU] | Freq: Three times a day (TID) | SUBCUTANEOUS | 11 refills | Status: DC

## 2019-01-23 MED ORDER — TOUJEO MAX SOLOSTAR 300 UNIT/ML ~~LOC~~ SOPN: 300.0000 [IU] | PEN_INJECTOR | Freq: Every day | SUBCUTANEOUS | 11 refills | Status: DC

## 2019-01-23 NOTE — Patient Instructions (Addendum)
check your blood sugar 5 times a day: before the 3 meals, and at bedtime.  also check if you have symptoms of your blood sugar being too high or too low.  please keep a record of the readings and bring it to your next appointment here (or you can bring the meter itself).  You can write it on any piece of paper.  please call us sooner if your blood sugar goes below 70, or if you have a lot of readings over 200.    Blood tests are requested for you today.  We'll let you know about the results.     I have asked Vaughan Basta about your pump paperwork.  We'll let you know.   To avoid insulin leakage, push the syringe in as far as it goes, and withdraw slowly. Please increase the lantus to 300 units daily, and: Take novolog 100 units 3 times a day (just before each meal).   Please see the [facility] gastroenterology office as scheduled today.  We'll send the lab results when we get them.  Please come back for a follow-up appointment in 1-2 weeks.

## 2019-01-23 NOTE — Telephone Encounter (Signed)
Apolonio Schneiders from [facility] called to get clarification on Novolog Rx that was sent over, Ammie already taken care of

## 2019-01-23 NOTE — Progress Notes (Signed)
Subjective:    Patient ID: Courtney Zamora, female    DOB: [birth year], 33 y.o.   MRN: [id number]  HPI Pt returns for f/u of diabetes mellitus: DM type: Insulin-requiring type 2.   Dx'ed: 2774 Complications: none.   Therapy: insulin since dx.  GDM: never DKA: never Severe hypoglycemia: never Pancreatitis: never Pancreatic imaging: never. Other: she takes multiple daily injections; she takes chronic steroids for asthma.   Interval history: Pt has a few weeks of nausea, but no abd pain.  She has intermitt vomiting.  cbg varies from 275-425.  She is unable to say why the worsening is, but it has happened over just the past few weeks.  No change in chronic prednisone dosage.  She denies probs with insulin injections.  She says just a few drops of insulin leak out.  She takes lantus, 60-90 units/day.  She says a vial lasts approx 1 week.  She takes novolog, 100-200 units per dose (3-4 doses per day).  sxs persist.  She has not recently taken victoza.  She saw PCP last week for the sxs, but pt has not yet done labs.  I reviewed continuous glucose monitor data.  Glucose varies from 190 to >400. There is no trend throughout the day,  except it is occasionally as low as 80 in the afternoon Past Medical History:  Diagnosis Date  . Anxiety   . Asthma   . Chronic headache    "1-2/week" (04/09/2018)  . Chronic lower back pain   . Depression   . Essential hypertension   . Family history of adverse reaction to anesthesia    "mom would get PONV" (04/09/2018)  . GERD (gastroesophageal reflux disease)   . Manic state ([place])   . Migraine    "a few/year" (04/09/2018)  . Obesity   . OSA on CPAP   . Osteoarthritis, knee   . Pneumonia    "this is my 2nd time" (04/09/2018)  . Prolonged QT syndrome   . PTSD (post-traumatic stress disorder)   . Rheumatoid arthritis ([place])   . Seizures ([place])    "stress induced; 1 in this past year" (04/09/2018)  . Severe needle phobia   . Type II diabetes mellitus ([place])      Past Surgical History:  Procedure Laterality Date  . TONSILLECTOMY  1994    Social History   Socioeconomic History  . Marital status: Married    Spouse name: Not on file  . Number of children: Not on file  . Years of education: Not on file  . Highest education level: Not on file  Occupational History  . Not on file  Social Needs  . Financial resource strain: Not on file  . Food insecurity    Worry: Not on file    Inability: Not on file  . Transportation needs    Medical: Not on file    Non-medical: Not on file  Tobacco Use  . Smoking status: Never Smoker  . Smokeless tobacco: Never Used  Substance and Sexual Activity  . Alcohol use: Not Currently  . Drug use: Never  . Sexual activity: Not Currently    Birth control/protection: None  Lifestyle  . Physical activity    Days per week: Not on file    Minutes per session: Not on file  . Stress: Not on file  Relationships  . Social Herbalist on phone: Not on file    Gets together: Not on file    Attends religious  service: Not on file    Active member of club or organization: Not on file    Attends meetings of clubs or organizations: Not on file    Relationship status: Not on file  . Intimate partner violence    Fear of current or ex partner: Not on file    Emotionally abused: Not on file    Physically abused: Not on file    Forced sexual activity: Not on file  Other Topics Concern  . Not on file  Social History Narrative  . Not on file    Current Outpatient Medications on File Prior to Visit  Medication Sig Dispense Refill  . acetaminophen-codeine (TYLENOL #3) 300-30 MG tablet Take by mouth every 4 (four) hours as needed for moderate pain.    Marland Kitchen albuterol (PROVENTIL HFA;VENTOLIN HFA) 108 (90 Base) MCG/ACT inhaler Inhale 2 puffs into the lungs every 6 (six) hours as needed for wheezing or shortness of breath (wheezing). For shortness of breath. 1 Inhaler 11  . albuterol (PROVENTIL) (2.5 MG/3ML) 0.083%  nebulizer solution Take 3 mLs (2.5 mg total) by nebulization every 6 (six) hours as needed for wheezing or shortness of breath. 150 mL 1  . atenolol (TENORMIN) 25 MG tablet Take 0.5 tablets (12.5 mg total) by mouth daily. 30 tablet 3  . BELBUCA 450 MCG FILM 900 mg.   0  . BELBUCA 900 MCG FILM DISSOLVE 1 FILM INSIDE OF CHEEKS Q 12 HOURS  0  . Biotin w/ Vitamins C & E (HAIR/SKIN/NAILS PO) Take 1 tablet by mouth daily.    . Calcium Citrate 200 MG TABS Take 2 tablets (400 mg total) by mouth every other day. (Patient taking differently: Take 200 mg by mouth daily. )    . cetirizine (ZYRTEC) 10 MG tablet TAKE 1 TABLET BY MOUTH EVERY NIGHT AT BEDTIME (Patient taking differently: Take 10 mg by mouth at bedtime. ) 30 tablet 0  . Crisaborole (EUCRISA) 2 % OINT Apply topically.    . DULoxetine (CYMBALTA) 30 MG capsule TAKE 1 CAPSULE BY MOUTH TWICE DAILY (Patient taking differently: Take 30 mg by mouth 2 (two) times daily. ) 60 capsule 2  . etanercept (ENBREL SURECLICK) 50 MG/ML injection Inject 50 mg as directed once a week.     . famotidine (PEPCID) 20 MG tablet TK 1 T PO BID  0  . furosemide (LASIX) 20 MG tablet TAKE 1 TABLET BY MOUTH EVERY OTHER DAY (Patient taking differently: Take 20 mg by mouth every other day. ) 30 tablet 3  . gabapentin (NEURONTIN) 300 MG capsule TAKE 1 CAPSULE BY MOUTH EVERY MORNING, 1 CAPSULE BY MOUTH AT NOON, AND 2 CAPSULES BY MOUTH AT BEDTIME (Patient taking differently: Take 300-600 mg by mouth See admin instructions. TAKE 1 CAPSULE BY MOUTH EVERY MORNING, 1 CAPSULE BY MOUTH AT NOON, AND 2 CAPSULES BY MOUTH AT BEDTIME) 120 capsule 5  . glucose blood (ACCU-CHEK AVIVA PLUS) test strip UAD UP TO TID TO CHECK BLOOD SUGAR 100 each 12  . HUMIRA 40 MG/0.4ML PSKT INJECT 1 PEN UNDER THE SKIN Q 14 DAYS  3  . hydrochlorothiazide (HYDRODIURIL) 25 MG tablet Take 1 tablet (25 mg total) by mouth daily. 90 tablet 3  . hydrOXYzine (ATARAX/VISTARIL) 10 MG tablet TAKE 1 TABLET(10 MG) BY MOUTH THREE  TIMES DAILY AS NEEDED 30 tablet 2  . Insulin Pen Needle (PEN NEEDLES) 31G X 8 MM MISC Use as directed 100 each 3  . ipratropium-albuterol (DUONEB) 0.5-2.5 (3) MG/3ML SOLN Take 3  mLs by nebulization every 6 (six) hours as needed. (Patient taking differently: Take 3 mLs by nebulization every 4 (four) hours as needed (for shortness of breath or wheezing). ) 360 mL 5  . montelukast (SINGULAIR) 10 MG tablet TAKE 1 TABLET(10 MG) BY MOUTH DAILY 30 tablet 2  . Multiple Vitamins-Minerals (WOMENS MULTI) CAPS Take 1 capsule by mouth daily.    . mycophenolate (CELLCEPT) 500 MG tablet Take 500 mg by mouth 2 (two) times daily.  2  . nabumetone (RELAFEN) 500 MG tablet Take 1 tablet (500 mg total) by mouth 2 (two) times daily as needed. 60 tablet 3  . Omega-3 Fatty Acids (FISH OIL) 1000 MG CAPS Take 1 capsule by mouth daily.    Marland Kitchen. oxyCODONE-acetaminophen (PERCOCET/ROXICET) 5-325 MG tablet TK 1 OR 2 TS PO Q  6 H PRN P  0  . pantoprazole (PROTONIX) 40 MG tablet TAKE 1 TABLET(40 MG) BY MOUTH TWICE DAILY BEFORE A MEAL 60 tablet 0  . phentermine 15 MG capsule Take 15 mg by mouth every morning.    . promethazine (PHENERGAN) 25 MG tablet Take 1 tablet (25 mg total) by mouth every 8 (eight) hours as needed for nausea or vomiting. 30 tablet 2  . Respiratory Therapy Supplies (FLUTTER) DEVI As directed. 1 each 0  . RYBELSUS 14 MG TABS Take 14 mg by mouth daily.    Marland Kitchen. Spacer/Aero-Holding Chambers (AEROCHAMBER MV) inhaler Use as instructed 1 each 0  . tiZANidine (ZANAFLEX) 4 MG tablet TAKE 1 TABLET(4 MG) BY MOUTH EVERY 6 HOURS AS NEEDED FOR MUSCLE SPASMS 60 tablet 0  . Vitamin D, Ergocalciferol, (DRISDOL) 50000 units CAPS capsule Take 50,000 Units by mouth once a week.  3  . VYVANSE 50 MG capsule Take 50 mg by mouth daily.  0   No current facility-administered medications on file prior to visit.     Allergies  Allergen Reactions  . Bee Venom Anaphylaxis  . Cinnamon Anaphylaxis  . Doxycycline Hives  . Ibuprofen Other  (See Comments)    Abdominal pain  . Lexapro [Escitalopram Oxalate] Other (See Comments)    Generalized body shaking. ? sz  . Latex Rash  . Tape Hives, Rash and Other (See Comments)    EKG STICKERS    Family History  Problem Relation Age of Onset  . Hypothyroidism Mother   . Diabetes Father   . Diabetes Paternal Uncle   . Hypertension Maternal Grandmother   . Hypertension Maternal Grandfather   . Pancreatic cancer Maternal Grandfather   . Hypertension Paternal Grandmother   . Heart disease Paternal Grandfather   . Hypertension Paternal Grandfather     BP 120/72 (BP Location: Left Arm, Patient Position: Sitting, Cuff Size: Large)   Pulse (!) 110   Ht 5\' 4"  (1.626 m)   Wt (!) 389 lb 12.8 oz (176.8 kg)   SpO2 90%   BMI 66.91 kg/m   Review of Systems Denies fever and hypoglycemia.  She has lost weight, since last ov here.      Objective:   Physical Exam VITAL SIGNS:  See vs page GENERAL: no distress.  Morbid obesity.  Has 02 on. ABDOMEN: abdomen is soft, nontender.  no hepatosplenomegaly.  not distended.  no hernia   Lab Results  Component Value Date   HGBA1C 13.2 (A) 01/23/2019       Assessment & Plan:  N/v, new, uncertain etiology Insulin-requiring type 2 DM: much worse for uncertain reason   Patient Instructions  check your blood sugar  5 times a day: before the 3 meals, and at bedtime.  also check if you have symptoms of your blood sugar being too high or too low.  please keep a record of the readings and bring it to your next appointment here (or you can bring the meter itself).  You can write it on any piece of paper.  please call us sooner if your blood sugar goes below 70, or if you have a lot of readings over 200.    Blood tests are requested for you today.  We'll let you know about the results.     I have asked Bonita Quin about your pump paperwork.  We'll let you know.   To avoid insulin leakage, push the syringe in as far as it goes, and withdraw slowly. Please  increase the lantus to 300 units daily, and: Take novolog 100 units 3 times a day (just before each meal).   Please see the [facility] gastroenterology office as scheduled today.  We'll send the lab results when we get them.  Please come back for a follow-up appointment in 1-2 weeks.

## 2019-01-24 ENCOUNTER — Telehealth: Payer: Self-pay

## 2019-01-24 ENCOUNTER — Other Ambulatory Visit: Payer: Self-pay | Admitting: Nurse Practitioner

## 2019-01-24 NOTE — Telephone Encounter (Signed)
PA initiated today with [employer] for [employer]. Ref # [id number]. LK#[id number]; APPROVED; 01/24/19 through 01/19/20.

## 2019-01-28 ENCOUNTER — Other Ambulatory Visit: Payer: Self-pay | Admitting: Endocrinology

## 2019-01-28 DIAGNOSIS — IMO0001 Reserved for inherently not codable concepts without codable children: Secondary | ICD-10-CM

## 2019-01-29 ENCOUNTER — Telehealth: Payer: Self-pay | Admitting: Nutrition

## 2019-01-29 NOTE — Telephone Encounter (Signed)
Message left on machine that I can find no pump paperwork that was filled out for her.  She was told to call me to let me know which pump she wants,and I can send that paperwork in for her

## 2019-02-01 ENCOUNTER — Ambulatory Visit: Payer: Medicaid Other | Admitting: Endocrinology

## 2019-02-04 ENCOUNTER — Telehealth: Payer: Self-pay | Admitting: Nutrition

## 2019-02-04 ENCOUNTER — Other Ambulatory Visit: Payer: Self-pay | Admitting: Nurse Practitioner

## 2019-02-04 DIAGNOSIS — R945 Abnormal results of liver function studies: Secondary | ICD-10-CM

## 2019-02-04 NOTE — Telephone Encounter (Signed)
Message left on my machine to call her.  I tried her back and left a message to let me know which pump she is wanting, so that I can begin the paperwork.

## 2019-02-05 NOTE — Telephone Encounter (Signed)
She states whatever will match her Dexcom. She believes it is [employer]andem.

## 2019-02-06 NOTE — Telephone Encounter (Signed)
Paperwork sent to [employer].

## 2019-02-08 ENCOUNTER — Other Ambulatory Visit: Payer: Self-pay

## 2019-02-08 ENCOUNTER — Encounter: Payer: Self-pay | Admitting: Endocrinology

## 2019-02-08 ENCOUNTER — Ambulatory Visit: Payer: Medicaid Other | Admitting: Endocrinology

## 2019-02-08 ENCOUNTER — Telehealth: Payer: Self-pay | Admitting: Endocrinology

## 2019-02-08 VITALS — BP 130/70 | HR 103 | Ht 64.0 in | Wt 389.0 lb

## 2019-02-08 DIAGNOSIS — IMO0001 Reserved for inherently not codable concepts without codable children: Secondary | ICD-10-CM

## 2019-02-08 DIAGNOSIS — Z794 Long term (current) use of insulin: Secondary | ICD-10-CM | POA: Diagnosis not present

## 2019-02-08 DIAGNOSIS — E119 Type 2 diabetes mellitus without complications: Secondary | ICD-10-CM | POA: Diagnosis not present

## 2019-02-08 DIAGNOSIS — E1165 Type 2 diabetes mellitus with hyperglycemia: Secondary | ICD-10-CM | POA: Diagnosis not present

## 2019-02-08 MED ORDER — INSULIN ASPART 100 UNIT/ML ~~LOC~~ SOLN: 150.0000 [IU] | Freq: Three times a day (TID) | SUBCUTANEOUS | 11 refills | Status: DC

## 2019-02-08 MED ORDER — TOUJEO MAX SOLOSTAR 300 UNIT/ML ~~LOC~~ SOPN: 250.0000 [IU] | PEN_INJECTOR | SUBCUTANEOUS | 11 refills | Status: DC

## 2019-02-08 NOTE — Patient Instructions (Addendum)
check your blood sugar 5 times a day: before the 3 meals, and at bedtime.  also check if you have symptoms of your blood sugar being too high or too low.  please keep a record of the readings and bring it to your next appointment here (or you can bring the meter itself).  You can write it on any piece of paper.  please call us sooner if your blood sugar goes below 70, or if you have a lot of readings over 200.    Please decrease the Toujeo to 250 units daily, and:  Take novolog 150 units 3 times a day (just before each meal).    Please come back for a follow-up appointment in 1 month.

## 2019-02-08 NOTE — Progress Notes (Signed)
Subjective:    Patient ID: Courtney Zamora, female    DOB: 1986-03-13, 33 y.o.   MRN: [id number]  HPI Pt returns for f/u of diabetes mellitus: DM type: Insulin-requiring type 2.   Dx'ed: 1025 Complications: none.   Therapy: insulin since dx.  GDM: never DKA: never Severe hypoglycemia: never Pancreatitis: never Pancreatic imaging: never. Other: she takes multiple daily injections; she takes chronic steroids for asthma.   Interval history: She takes insulin as rx'ed.  I reviewed continuous glucose monitor data.  Glucose varies from 90-400.  It is in general higher as the day goes on.   Past Medical History:  Diagnosis Date  . Anxiety   . Asthma   . Chronic headache    "1-2/week" (04/09/2018)  . Chronic lower back pain   . Depression   . Essential hypertension   . Family history of adverse reaction to anesthesia    "mom would get PONV" (04/09/2018)  . GERD (gastroesophageal reflux disease)   . Manic state ([place])   . Migraine    "a few/year" (04/09/2018)  . Obesity   . OSA on CPAP   . Osteoarthritis, knee   . Pneumonia    "this is my 2nd time" (04/09/2018)  . Prolonged QT syndrome   . PTSD (post-traumatic stress disorder)   . Rheumatoid arthritis ([place])   . Seizures ([place])    "stress induced; 1 in this past year" (04/09/2018)  . Severe needle phobia   . Type II diabetes mellitus ([place])     Past Surgical History:  Procedure Laterality Date  . TONSILLECTOMY  1994    Social History   Socioeconomic History  . Marital status: Married    Spouse name: Not on file  . Number of children: Not on file  . Years of education: Not on file  . Highest education level: Not on file  Occupational History  . Not on file  Social Needs  . Financial resource strain: Not on file  . Food insecurity    Worry: Not on file    Inability: Not on file  . Transportation needs    Medical: Not on file    Non-medical: Not on file  Tobacco Use  . Smoking status: Never Smoker  . Smokeless  tobacco: Never Used  Substance and Sexual Activity  . Alcohol use: Not Currently  . Drug use: Never  . Sexual activity: Not Currently    Birth control/protection: None  Lifestyle  . Physical activity    Days per week: Not on file    Minutes per session: Not on file  . Stress: Not on file  Relationships  . Social Herbalist on phone: Not on file    Gets together: Not on file    Attends religious service: Not on file    Active member of club or organization: Not on file    Attends meetings of clubs or organizations: Not on file    Relationship status: Not on file  . Intimate partner violence    Fear of current or ex partner: Not on file    Emotionally abused: Not on file    Physically abused: Not on file    Forced sexual activity: Not on file  Other Topics Concern  . Not on file  Social History Narrative  . Not on file    Current Outpatient Medications on File Prior to Visit  Medication Sig Dispense Refill  . acetaminophen-codeine (TYLENOL #3) 300-30 MG tablet Take by  mouth every 4 (four) hours as needed for moderate pain.    Marland Kitchen. albuterol (PROVENTIL HFA;VENTOLIN HFA) 108 (90 Base) MCG/ACT inhaler Inhale 2 puffs into the lungs every 6 (six) hours as needed for wheezing or shortness of breath (wheezing). For shortness of breath. 1 Inhaler 11  . albuterol (PROVENTIL) (2.5 MG/3ML) 0.083% nebulizer solution Take 3 mLs (2.5 mg total) by nebulization every 6 (six) hours as needed for wheezing or shortness of breath. 150 mL 1  . atenolol (TENORMIN) 25 MG tablet Take 0.5 tablets (12.5 mg total) by mouth daily. 30 tablet 3  . BELBUCA 450 MCG FILM 900 mg.   0  . BELBUCA 900 MCG FILM DISSOLVE 1 FILM INSIDE OF CHEEKS Q 12 HOURS  0  . Biotin w/ Vitamins C & E (HAIR/SKIN/NAILS PO) Take 1 tablet by mouth daily.    . Calcium Citrate 200 MG TABS Take 2 tablets (400 mg total) by mouth every other day. (Patient taking differently: Take 200 mg by mouth daily. )    . cetirizine (ZYRTEC) 10  MG tablet TAKE 1 TABLET BY MOUTH EVERY NIGHT AT BEDTIME (Patient taking differently: Take 10 mg by mouth at bedtime. ) 30 tablet 0  . Crisaborole (EUCRISA) 2 % OINT Apply topically.    . DULoxetine (CYMBALTA) 30 MG capsule TAKE 1 CAPSULE BY MOUTH TWICE DAILY (Patient taking differently: Take 30 mg by mouth 2 (two) times daily. ) 60 capsule 2  . famotidine (PEPCID) 20 MG tablet TK 1 T PO BID  0  . furosemide (LASIX) 20 MG tablet TAKE 1 TABLET BY MOUTH EVERY OTHER DAY (Patient taking differently: Take 20 mg by mouth every other day. ) 30 tablet 3  . gabapentin (NEURONTIN) 300 MG capsule TAKE 1 CAPSULE BY MOUTH EVERY MORNING, 1 CAPSULE BY MOUTH AT NOON, AND 2 CAPSULES BY MOUTH AT BEDTIME (Patient taking differently: Take 300-600 mg by mouth See admin instructions. TAKE 1 CAPSULE BY MOUTH EVERY MORNING, 1 CAPSULE BY MOUTH AT NOON, AND 2 CAPSULES BY MOUTH AT BEDTIME) 120 capsule 5  . glucose blood (ACCU-CHEK AVIVA PLUS) test strip UAD UP TO TID TO CHECK BLOOD SUGAR 100 each 12  . hydrochlorothiazide (HYDRODIURIL) 25 MG tablet Take 1 tablet (25 mg total) by mouth daily. 90 tablet 3  . hydrOXYzine (ATARAX/VISTARIL) 10 MG tablet TAKE 1 TABLET(10 MG) BY MOUTH THREE TIMES DAILY AS NEEDED 30 tablet 2  . Insulin Pen Needle (B-D ULTRAFINE III SHORT PEN) 31G X 8 MM MISC Use to inject insulin once daily 50 each 2  . ipratropium-albuterol (DUONEB) 0.5-2.5 (3) MG/3ML SOLN Take 3 mLs by nebulization every 6 (six) hours as needed. (Patient taking differently: Take 3 mLs by nebulization every 4 (four) hours as needed (for shortness of breath or wheezing). ) 360 mL 5  . montelukast (SINGULAIR) 10 MG tablet TAKE 1 TABLET(10 MG) BY MOUTH DAILY 30 tablet 2  . Multiple Vitamins-Minerals (WOMENS MULTI) CAPS Take 1 capsule by mouth daily.    . mycophenolate (CELLCEPT) 500 MG tablet Take 500 mg by mouth 2 (two) times daily.  2  . nabumetone (RELAFEN) 500 MG tablet Take 1 tablet (500 mg total) by mouth 2 (two) times daily as  needed. 60 tablet 3  . Omega-3 Fatty Acids (FISH OIL) 1000 MG CAPS Take 1 capsule by mouth daily.    Marland Kitchen. oxyCODONE-acetaminophen (PERCOCET/ROXICET) 5-325 MG tablet TK 1 OR 2 TS PO Q  6 H PRN P  0  . pantoprazole (PROTONIX) 40 MG  tablet TAKE 1 TABLET(40 MG) BY MOUTH TWICE DAILY BEFORE A MEAL 60 tablet 0  . phentermine 15 MG capsule Take 15 mg by mouth every morning.    . promethazine (PHENERGAN) 25 MG tablet Take 1 tablet (25 mg total) by mouth every 8 (eight) hours as needed for nausea or vomiting. 30 tablet 2  . Respiratory Therapy Supplies (FLUTTER) DEVI As directed. 1 each 0  . RYBELSUS 14 MG TABS Take 14 mg by mouth daily.    Marland Kitchen Spacer/Aero-Holding Chambers (AEROCHAMBER MV) inhaler Use as instructed 1 each 0  . tiZANidine (ZANAFLEX) 4 MG tablet TAKE 1 TABLET(4 MG) BY MOUTH EVERY 6 HOURS AS NEEDED FOR MUSCLE SPASMS 60 tablet 0  . Vitamin D, Ergocalciferol, (DRISDOL) 50000 units CAPS capsule Take 50,000 Units by mouth once a week.  3  . VYVANSE 50 MG capsule Take 50 mg by mouth daily.  0  . etanercept (ENBREL SURECLICK) 50 MG/ML injection Inject 50 mg as directed once a week.     Marland Kitchen HUMIRA 40 MG/0.4ML PSKT INJECT 1 PEN UNDER THE SKIN Q 14 DAYS  3   No current facility-administered medications on file prior to visit.     Allergies  Allergen Reactions  . Bee Venom Anaphylaxis  . Cinnamon Anaphylaxis  . Doxycycline Hives  . Ibuprofen Other (See Comments)    Abdominal pain  . Lexapro [Escitalopram Oxalate] Other (See Comments)    Generalized body shaking. ? sz  . Latex Rash  . Tape Hives, Rash and Other (See Comments)    EKG STICKERS    Family History  Problem Relation Age of Onset  . Hypothyroidism Mother   . Diabetes Father   . Diabetes Paternal Uncle   . Hypertension Maternal Grandmother   . Hypertension Maternal Grandfather   . Pancreatic cancer Maternal Grandfather   . Hypertension Paternal Grandmother   . Heart disease Paternal Grandfather   . Hypertension Paternal  Grandfather     BP 130/70 (BP Location: Left Wrist, Patient Position: Sitting, Cuff Size: Large)   Pulse (!) 103   Ht 5\' 4"  (1.626 m)   Wt (!) 389 lb (176.4 kg)   SpO2 97%   BMI 66.77 kg/m    Review of Systems She denies hypoglycemia    Objective:   Physical Exam VITAL SIGNS:  See vs page GENERAL: no distress.  Has 02 on.  Pulses: dorsalis pedis intact bilat.   MSK: no deformity of the feet CV: 2+ bilat leg edema Skin:  no ulcer on the feet.  normal color and temp on the feet. Neuro: sensation is intact to touch on the feet Ext: There is bilateral onychomycosis of the toenails.  Lab Results  Component Value Date   HGBA1C 13.2 (A) 01/23/2019       Assessment & Plan:  Insulin-requiring type 2 DM: Based on the pattern of her cbg's, she needs some adjustment in her therapy. Edema: This limits rx options.  Patient Instructions  check your blood sugar 5 times a day: before the 3 meals, and at bedtime.  also check if you have symptoms of your blood sugar being too high or too low.  please keep a record of the readings and bring it to your next appointment here (or you can bring the meter itself).  You can write it on any piece of paper.  please call 03/26/2019 sooner if your blood sugar goes below 70, or if you have a lot of readings over 200.    Please  decrease the Toujeo to 250 units daily, and:  Take novolog 150 units 3 times a day (just before each meal).    Please come back for a follow-up appointment in 1 month.

## 2019-02-08 NOTE — Telephone Encounter (Signed)
Courtney Zamora, Pt is requesting a call upon your return to office to discuss her pump

## 2019-02-11 NOTE — Telephone Encounter (Signed)
Message left that I had sent paperwork in last week for the [employer] pump (the only pump that connects to Dexcom.  Told her to call Gerald Stabs and gave her his telephone number to get update on status of her pump order.  Also gave her my number if she has any more questions

## 2019-02-15 ENCOUNTER — Ambulatory Visit
Admission: RE | Admit: 2019-02-15 | Discharge: 2019-02-15 | Disposition: A | Discharge status: Home / Self Care | Payer: Medicaid Other | Source: Ambulatory Visit | Attending: Nurse Practitioner | Admitting: Nurse Practitioner

## 2019-02-15 DIAGNOSIS — R945 Abnormal results of liver function studies: Secondary | ICD-10-CM

## 2019-02-15 MED ORDER — IOPAMIDOL (ISOVUE-300) INJECTION 61%
125.0000 mL | Freq: Once | INTRAVENOUS | Status: AC | PRN
  Administered 2019-02-15: 125 mL via INTRAVENOUS

## 2019-02-20 ENCOUNTER — Other Ambulatory Visit: Payer: Self-pay | Admitting: Endocrinology

## 2019-02-20 DIAGNOSIS — IMO0001 Reserved for inherently not codable concepts without codable children: Secondary | ICD-10-CM

## 2019-02-25 IMAGING — DX DG NECK SOFT TISSUE
3 series · 3 of 3 positions shown · non-contrast
Comparison: None.

CLINICAL DATA: 32-year-old female with chicken bone stuck in the
throat.

EXAM:
NECK SOFT TISSUES - 1+ VIEW

[neck lat]
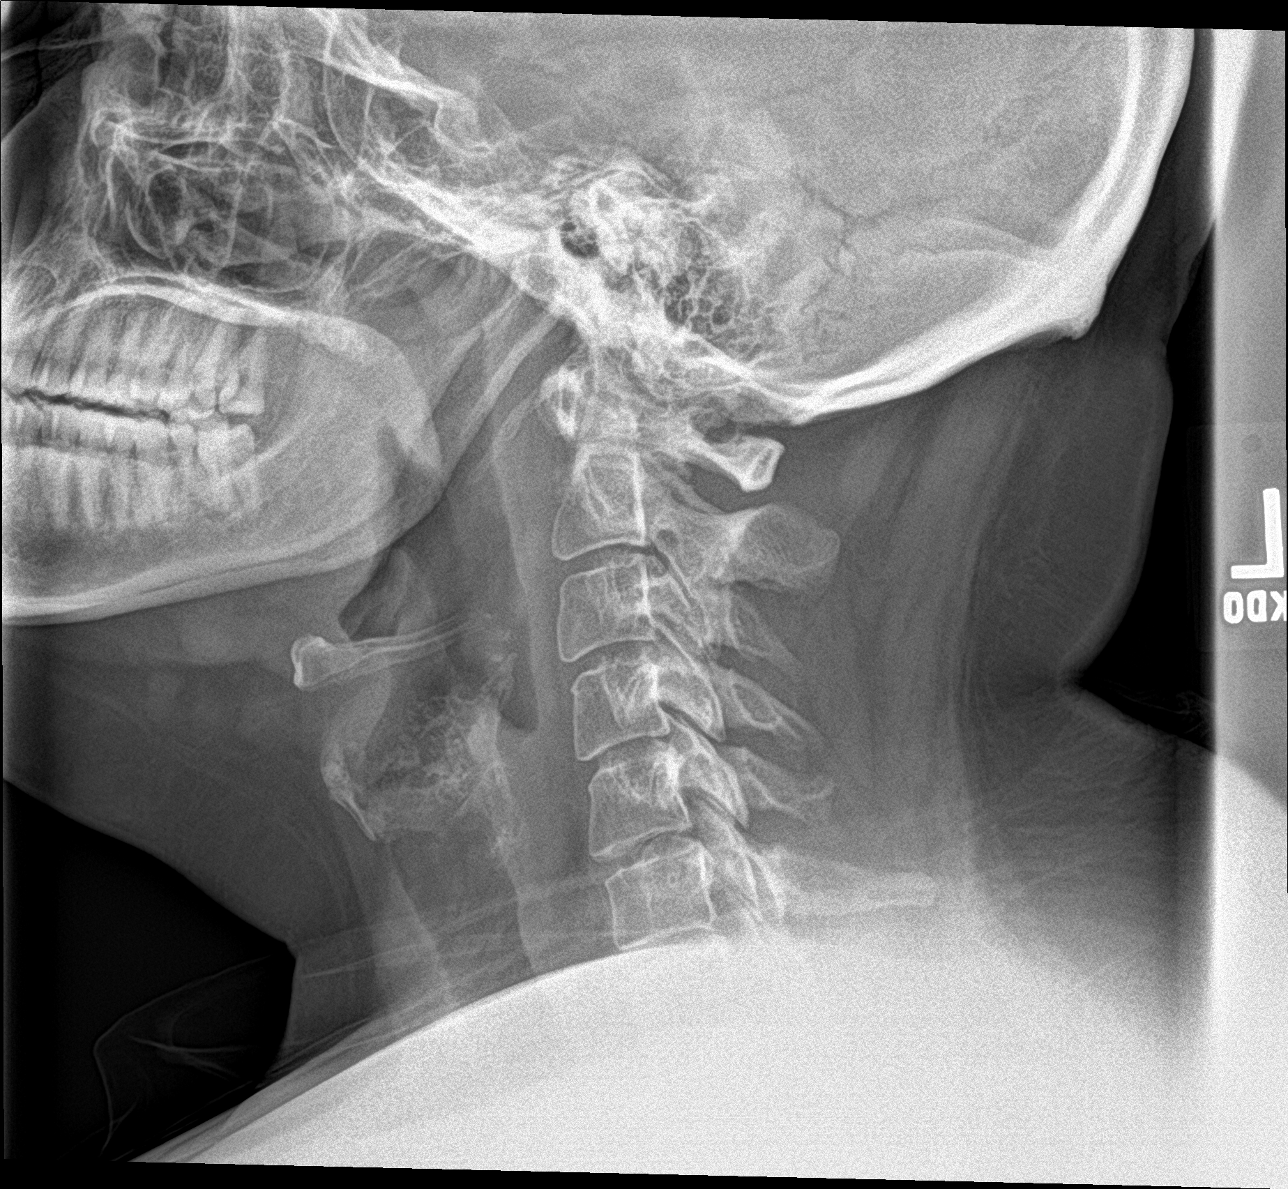

[neck ap (1 of 2)]
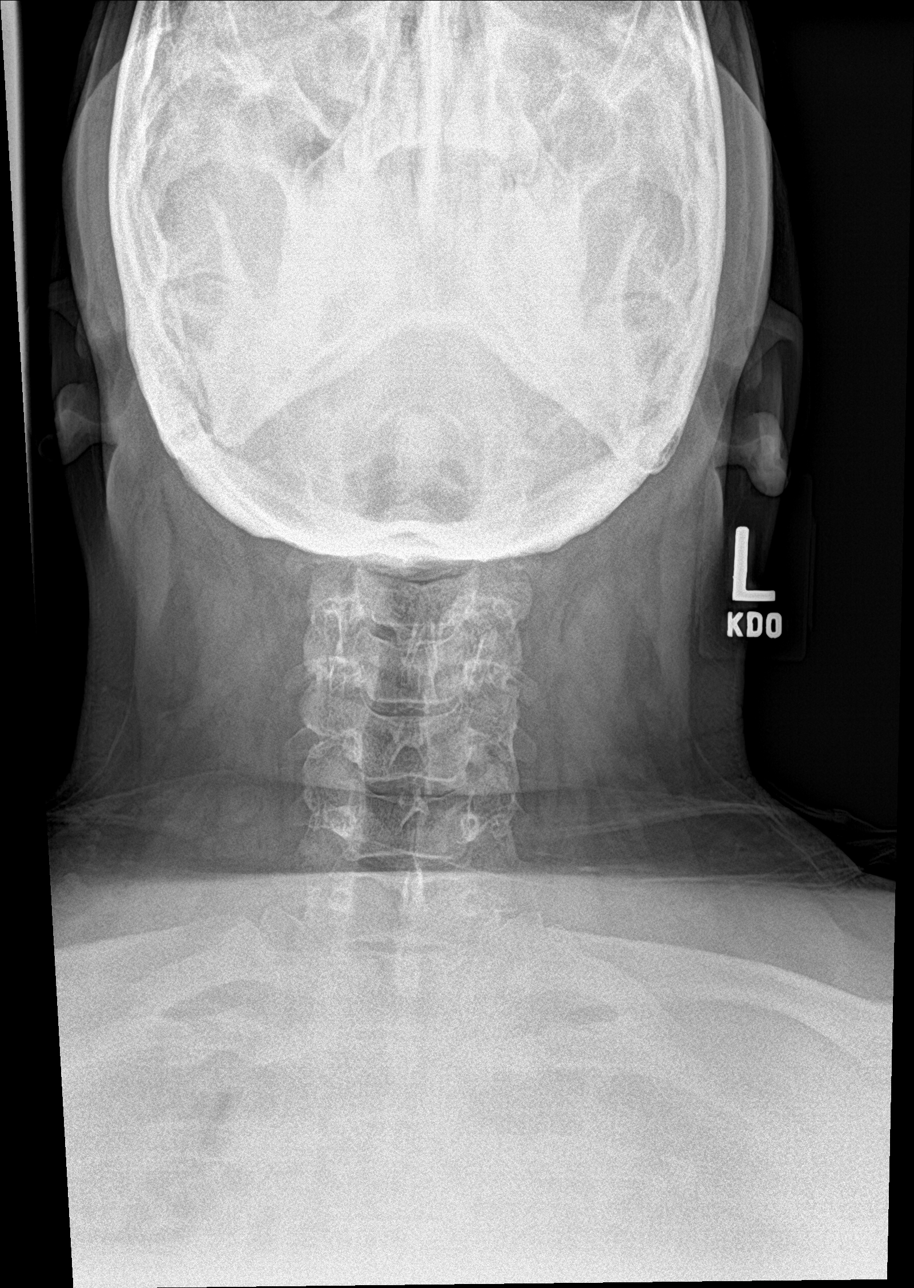

[neck ap (2 of 2)]
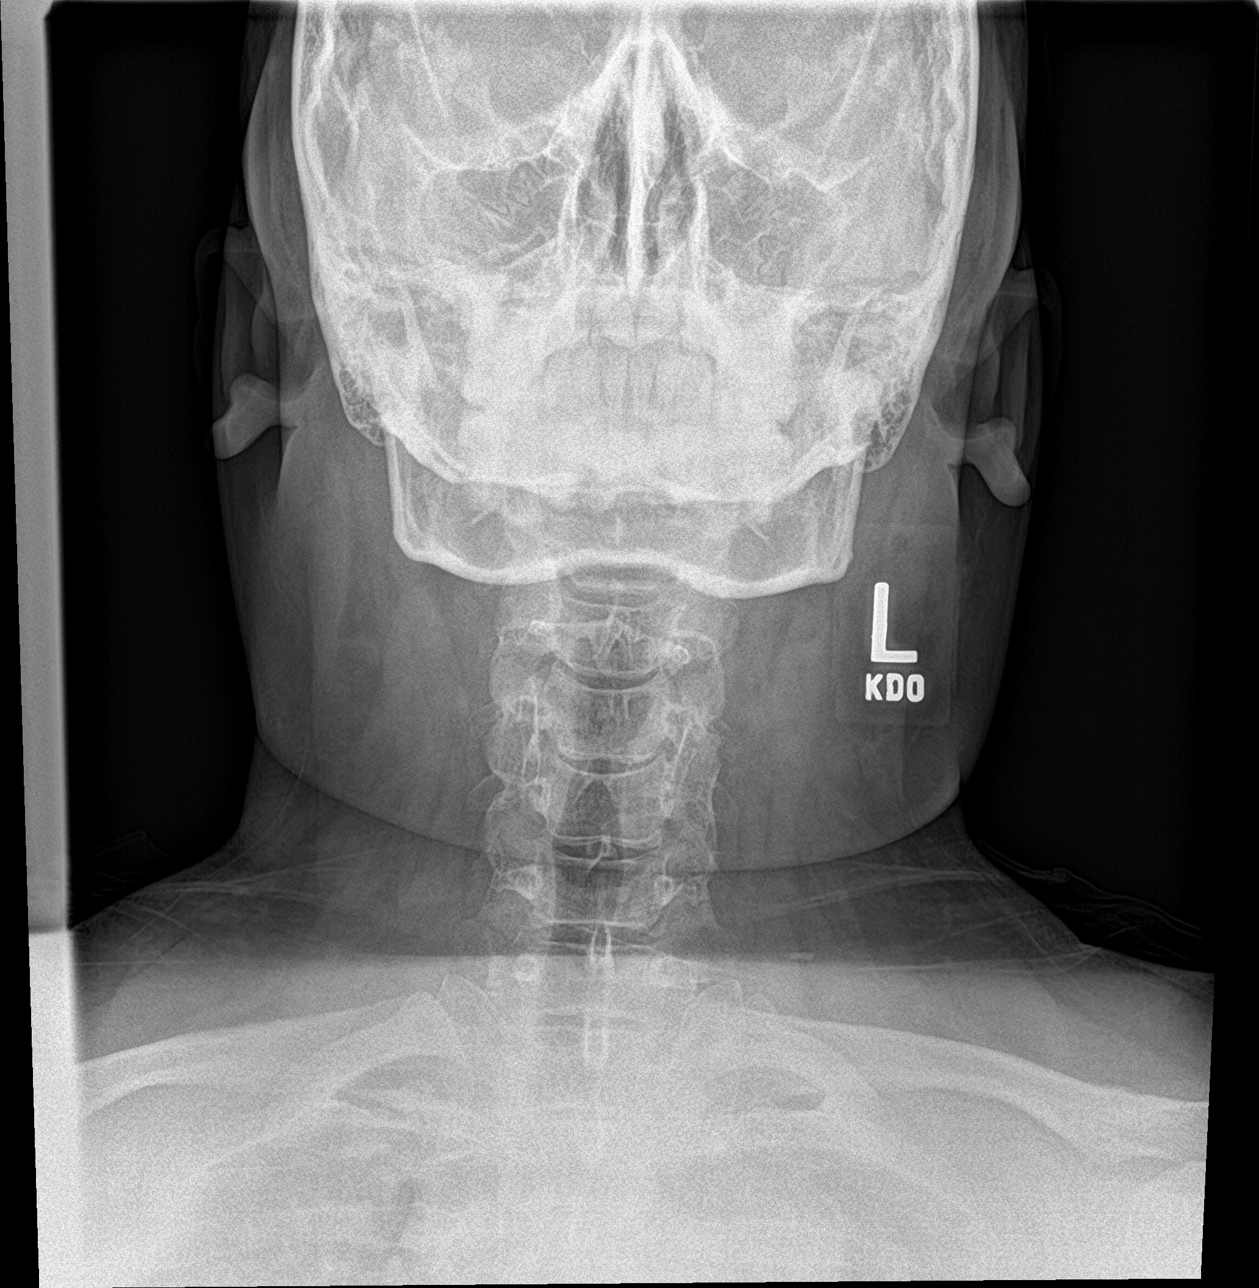

[3 of 3 positions shown; findings below may reference images not displayed]

FINDINGS: There is no evidence of retropharyngeal soft tissue swelling or
epiglottic enlargement. The cervical airway is unremarkable and no
radio-opaque foreign body identified.
IMPRESSION: No radiopaque foreign object.

## 2019-02-26 IMAGING — CT CT CHEST HIGH RESOLUTION W/O CM
2 of 5 series · 14 of 36 positions shown, 17 images · non-contrast
Comparison: CT neck 04/09/2018 and CT chest 11/25/2017.

CLINICAL DATA: Abnormal CT neck with multifocal pneumonia. Hypoxia.
Asthma.

EXAM:
CT CHEST WITHOUT CONTRAST
TECHNIQUE: Multidetector CT imaging of the chest was performed following the
standard protocol without intravenous contrast. High resolution
imaging of the lungs, as well as inspiratory and expiratory imaging,
was performed.

[Series 5: high resolution 2.0 b30f · axial · 0.88mm/px · z∈[+1086,+1314]mm · 11 of 128 slices shown, 14 images]
[im 7/128  mediastinal]
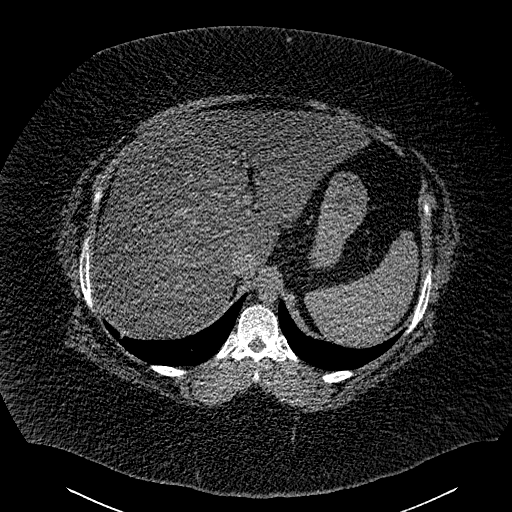
[im 7/128  lung]
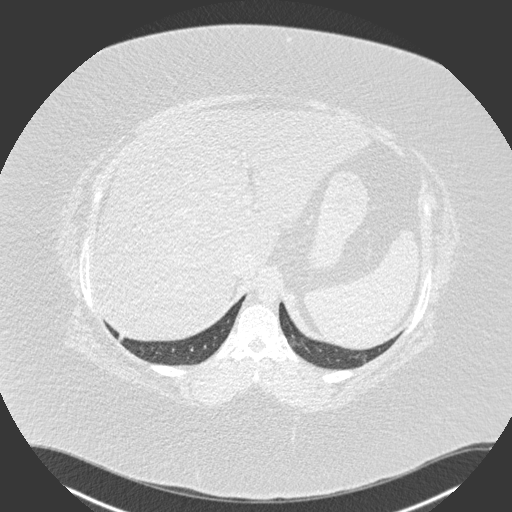
[im 19/128  lung]
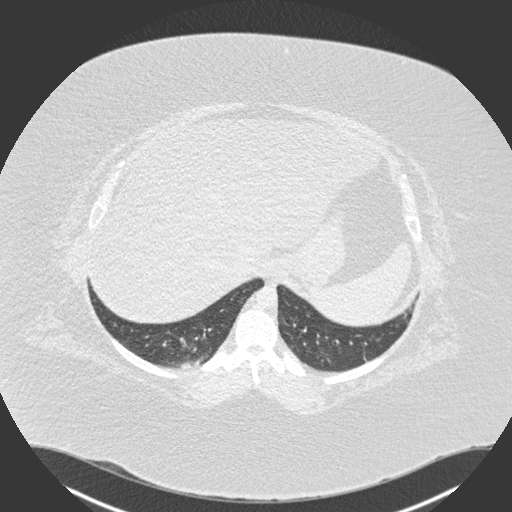
[im 31/128  lung]
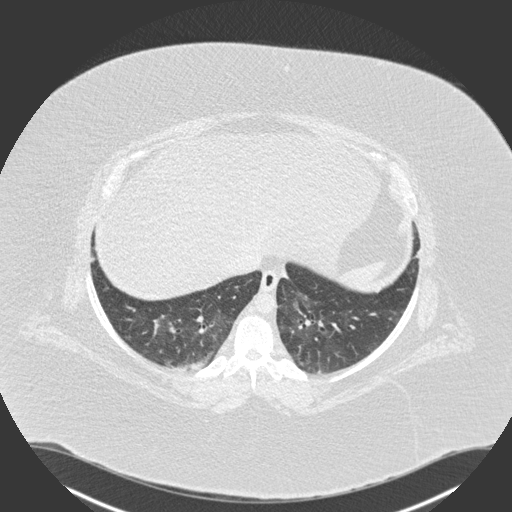
[im 43/128  lung]
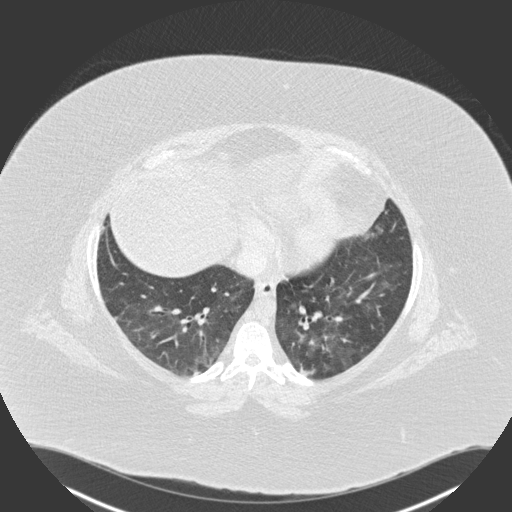
[im 55/128  mediastinal]
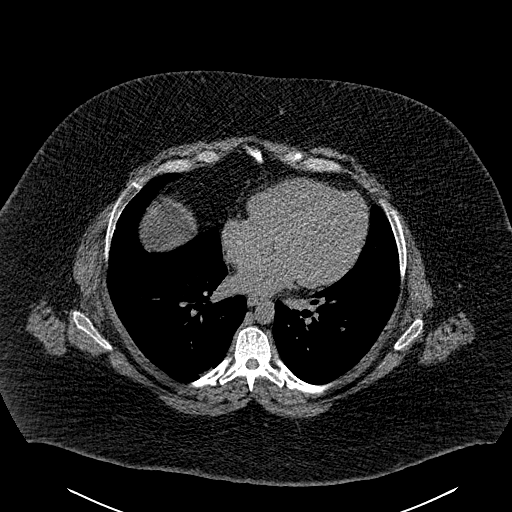
[im 55/128  lung]
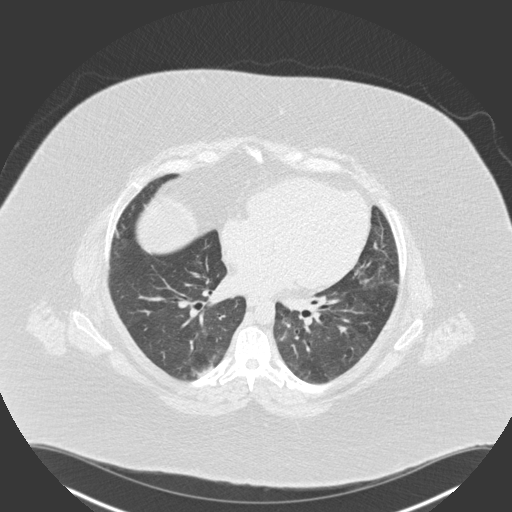
[im 67/128  lung]
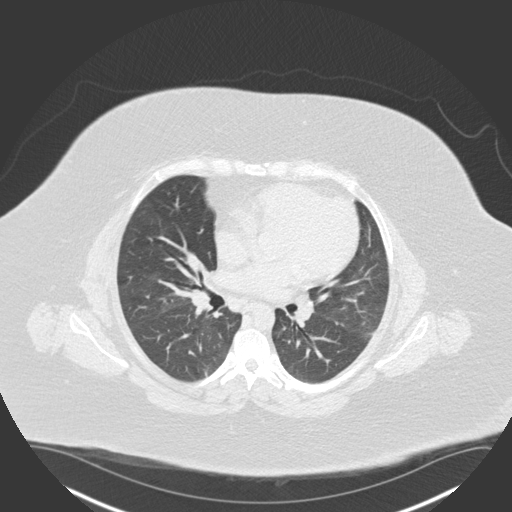
[im 73/128  lung]
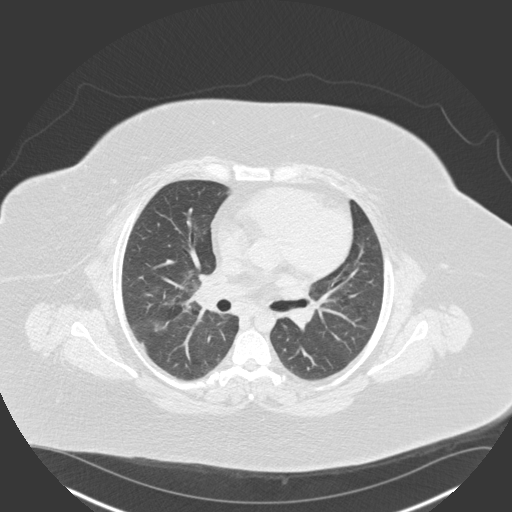
[im 85/128  lung]
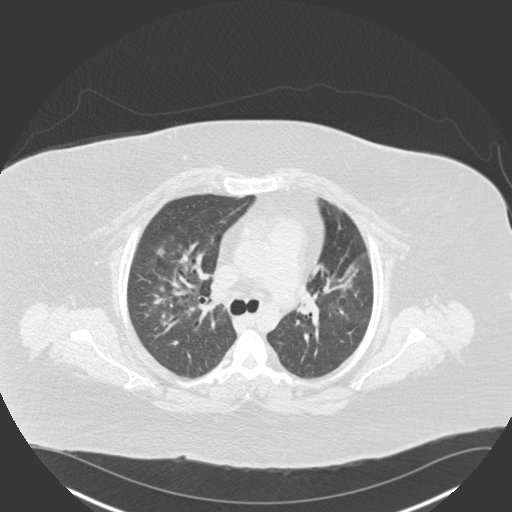
[im 97/128  mediastinal]
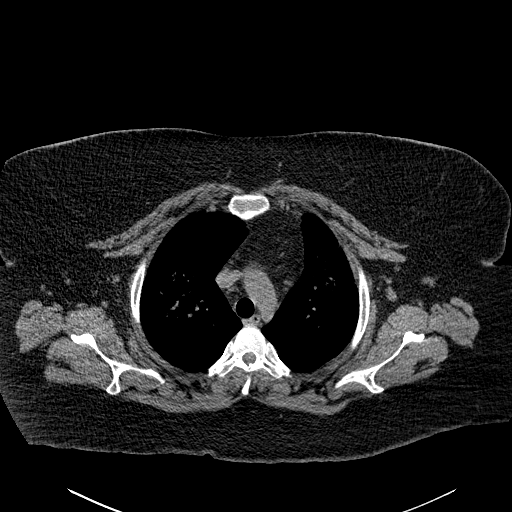
[im 97/128  lung]
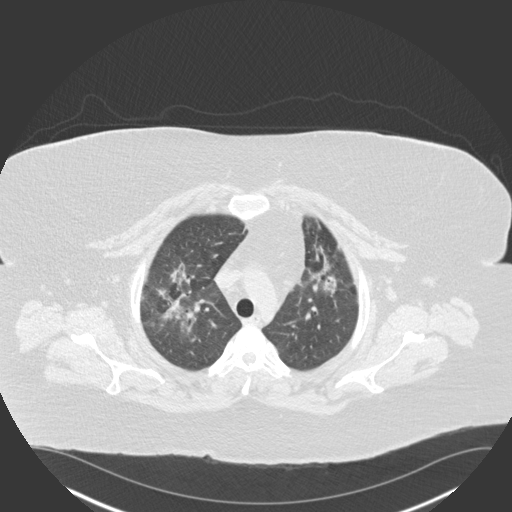
[im 109/128  lung]
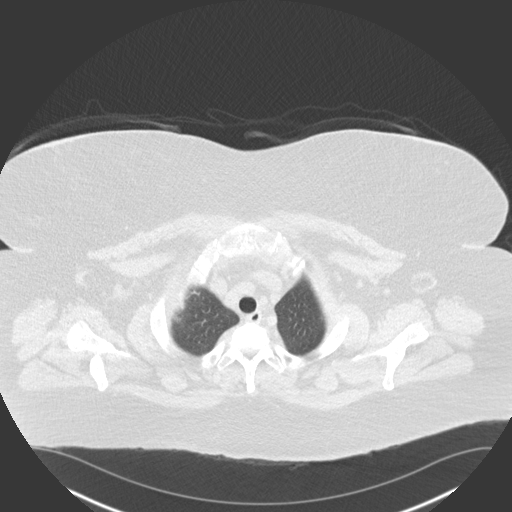
[im 121/128  lung]
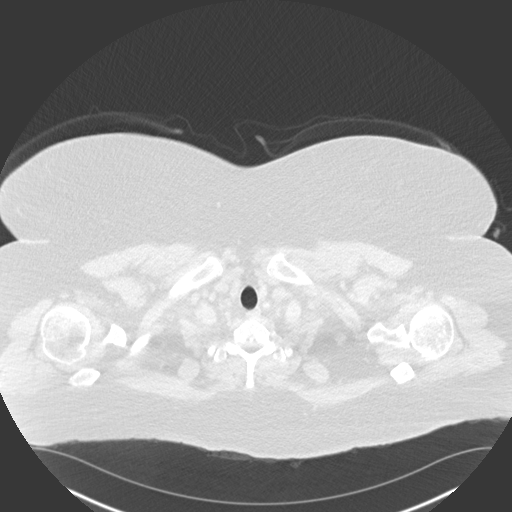

[Series 8: coronal · coronal · 0.51mm/px · 3 of 151 slices shown]
[im 31/151  lung]
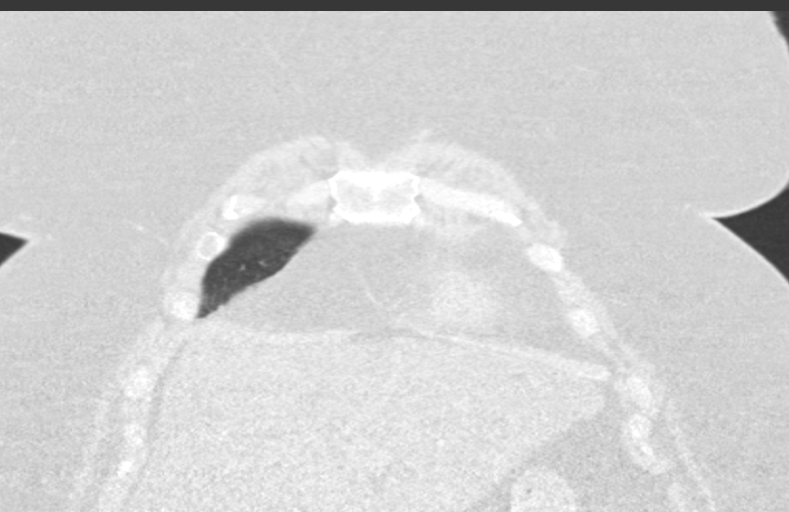
[im 61/151  lung]
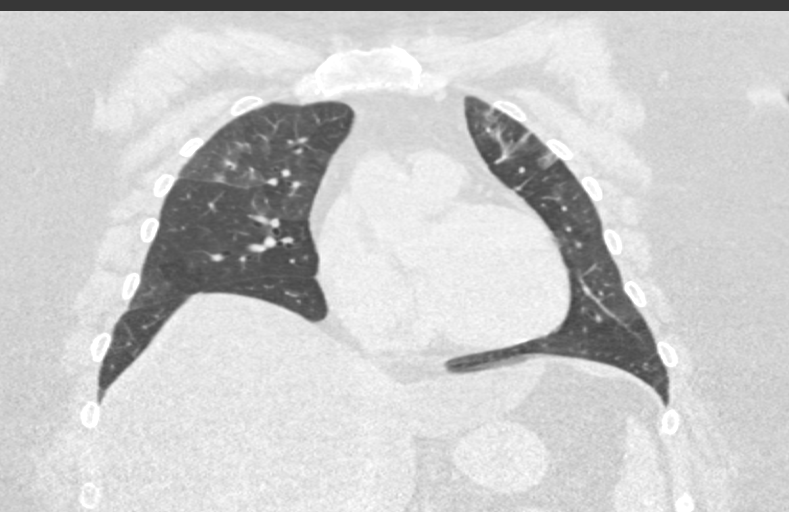
[im 91/151  lung]
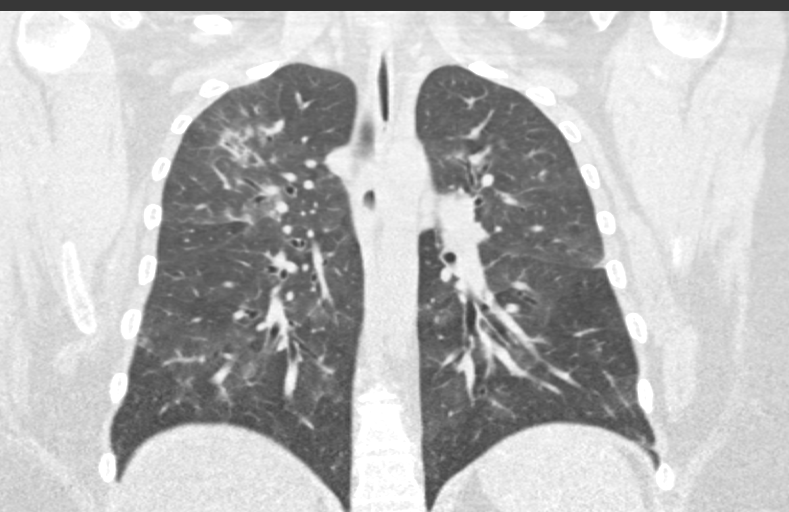

[14 of 36 positions shown; findings below may reference images not displayed]

FINDINGS: Cardiovascular: Heart is at the upper limits of normal in size to
mildly enlarged. No pericardial effusion.

Mediastinum/Nodes: Mediastinal lymph nodes measure up to 10 mm in
the low right paratracheal station, similar. Hilar regions are
difficult to definitively evaluate without IV contrast. Axillary
lymph nodes are not considered enlarged by CT size criteria.
Esophagus is grossly unremarkable.

Lungs/Pleura: Patchy bilateral ground-glass and consolidation in the
lungs bilaterally. No pleural fluid. Airway is unremarkable.

Upper Abdomen: Visualized portion of the liver is decreased in
attenuation diffusely. Visualized portions of the spleen and stomach
are grossly unremarkable.

Musculoskeletal: No worrisome lytic or sclerotic lesions.
IMPRESSION: 1. Patchy ground-glass and consolidation in the lungs bilaterally,
most indicative of pneumonia.
2. Hepatic steatosis.

## 2019-03-01 ENCOUNTER — Other Ambulatory Visit: Payer: Self-pay | Admitting: Internal Medicine

## 2019-03-01 ENCOUNTER — Ambulatory Visit: Payer: Medicaid Other | Admitting: Gastroenterology

## 2019-03-01 NOTE — Progress Notes (Deleted)
[facility] Progress Note  Chief Complaint: ***  Subjective  History: Seen March 2019 by PA for years of nausea and heartburn, intermittent dysphagia.  Barium study suggested distal esophageal abnormality.  EGD scheduled, canceled due to scheduling miscommunication, then patient canceled the next date in July 2019. ***  ROS: Cardiovascular:  no chest pain Respiratory: no dyspnea  The patient's Past Medical, Family and Social History were reviewed and are on file in the EMR.  Objective:  Med list reviewed  Current Outpatient Medications:  .  acetaminophen-codeine (TYLENOL #3) 300-30 MG tablet, Take by mouth every 4 (four) hours as needed for moderate pain., Disp: , Rfl:  .  albuterol (PROVENTIL HFA;VENTOLIN HFA) 108 (90 Base) MCG/ACT inhaler, Inhale 2 puffs into the lungs every 6 (six) hours as needed for wheezing or shortness of breath (wheezing). For shortness of breath., Disp: 1 Inhaler, Rfl: 11 .  albuterol (PROVENTIL) (2.5 MG/3ML) 0.083% nebulizer solution, Take 3 mLs (2.5 mg total) by nebulization every 6 (six) hours as needed for wheezing or shortness of breath., Disp: 150 mL, Rfl: 1 .  atenolol (TENORMIN) 25 MG tablet, Take 0.5 tablets (12.5 mg total) by mouth daily., Disp: 30 tablet, Rfl: 3 .  BELBUCA 450 MCG FILM, 900 mg. , Disp: , Rfl: 0 .  BELBUCA 900 MCG FILM, DISSOLVE 1 FILM INSIDE OF CHEEKS Q 12 HOURS, Disp: , Rfl: 0 .  Biotin w/ Vitamins C & E (HAIR/SKIN/NAILS PO), Take 1 tablet by mouth daily., Disp: , Rfl:  .  Calcium Citrate 200 MG TABS, Take 2 tablets (400 mg total) by mouth every other day. (Patient taking differently: Take 200 mg by mouth daily. ), Disp: , Rfl:  .  cetirizine (ZYRTEC) 10 MG tablet, TAKE 1 TABLET BY MOUTH EVERY NIGHT AT BEDTIME (Patient taking differently: Take 10 mg by mouth at bedtime. ), Disp: 30 tablet, Rfl: 0 .  Crisaborole (EUCRISA) 2 % OINT, Apply topically., Disp: , Rfl:  .  DULoxetine (CYMBALTA) 30 MG capsule, TAKE 1 CAPSULE BY  MOUTH TWICE DAILY (Patient taking differently: Take 30 mg by mouth 2 (two) times daily. ), Disp: 60 capsule, Rfl: 2 .  etanercept (ENBREL SURECLICK) 50 MG/ML injection, Inject 50 mg as directed once a week. , Disp: , Rfl:  .  famotidine (PEPCID) 20 MG tablet, TK 1 T PO BID, Disp: , Rfl: 0 .  furosemide (LASIX) 20 MG tablet, TAKE 1 TABLET BY MOUTH EVERY OTHER DAY (Patient taking differently: Take 20 mg by mouth every other day. ), Disp: 30 tablet, Rfl: 3 .  gabapentin (NEURONTIN) 300 MG capsule, TAKE 1 CAPSULE BY MOUTH EVERY MORNING, 1 CAPSULE BY MOUTH AT NOON, AND 2 CAPSULES BY MOUTH AT BEDTIME (Patient taking differently: Take 300-600 mg by mouth See admin instructions. TAKE 1 CAPSULE BY MOUTH EVERY MORNING, 1 CAPSULE BY MOUTH AT NOON, AND 2 CAPSULES BY MOUTH AT BEDTIME), Disp: 120 capsule, Rfl: 5 .  glucose blood (ACCU-CHEK AVIVA PLUS) test strip, UAD UP TO TID TO CHECK BLOOD SUGAR, Disp: 100 each, Rfl: 12 .  HUMIRA 40 MG/0.4ML PSKT, INJECT 1 PEN UNDER THE SKIN Q 14 DAYS, Disp: , Rfl: 3 .  hydrochlorothiazide (HYDRODIURIL) 25 MG tablet, Take 1 tablet (25 mg total) by mouth daily., Disp: 90 tablet, Rfl: 3 .  hydrOXYzine (ATARAX/VISTARIL) 10 MG tablet, TAKE 1 TABLET(10 MG) BY MOUTH THREE TIMES DAILY AS NEEDED, Disp: 30 tablet, Rfl: 2 .  insulin aspart (NOVOLOG) 100 UNIT/ML injection, Inject 150 Units  into the skin 3 (three) times daily with meals., Disp: 140 mL, Rfl: 11 .  Insulin Glargine, 2 Unit Dial, (TOUJEO MAX SOLOSTAR) 300 UNIT/ML SOPN, Inject 250 Units into the skin every morning., Disp: 10 pen, Rfl: 11 .  Insulin Pen Needle (B-D ULTRAFINE III SHORT PEN) 31G X 8 MM MISC, Use to inject insulin once daily, Disp: 50 each, Rfl: 2 .  ipratropium-albuterol (DUONEB) 0.5-2.5 (3) MG/3ML SOLN, Take 3 mLs by nebulization every 6 (six) hours as needed. (Patient taking differently: Take 3 mLs by nebulization every 4 (four) hours as needed (for shortness of breath or wheezing). ), Disp: 360 mL, Rfl: 5 .   montelukast (SINGULAIR) 10 MG tablet, TAKE 1 TABLET(10 MG) BY MOUTH DAILY, Disp: 30 tablet, Rfl: 2 .  Multiple Vitamins-Minerals (WOMENS MULTI) CAPS, Take 1 capsule by mouth daily., Disp: , Rfl:  .  mycophenolate (CELLCEPT) 500 MG tablet, Take 500 mg by mouth 2 (two) times daily., Disp: , Rfl: 2 .  nabumetone (RELAFEN) 500 MG tablet, Take 1 tablet (500 mg total) by mouth 2 (two) times daily as needed., Disp: 60 tablet, Rfl: 3 .  Omega-3 Fatty Acids (FISH OIL) 1000 MG CAPS, Take 1 capsule by mouth daily., Disp: , Rfl:  .  oxyCODONE-acetaminophen (PERCOCET/ROXICET) 5-325 MG tablet, TK 1 OR 2 TS PO Q  6 H PRN P, Disp: , Rfl: 0 .  pantoprazole (PROTONIX) 40 MG tablet, TAKE 1 TABLET(40 MG) BY MOUTH TWICE DAILY BEFORE A MEAL, Disp: 60 tablet, Rfl: 0 .  phentermine 15 MG capsule, Take 15 mg by mouth every morning., Disp: , Rfl:  .  promethazine (PHENERGAN) 25 MG tablet, Take 1 tablet (25 mg total) by mouth every 8 (eight) hours as needed for nausea or vomiting., Disp: 30 tablet, Rfl: 2 .  Respiratory Therapy Supplies (FLUTTER) DEVI, As directed., Disp: 1 each, Rfl: 0 .  RYBELSUS 14 MG TABS, Take 14 mg by mouth daily., Disp: , Rfl:  .  Spacer/Aero-Holding Chambers (AEROCHAMBER MV) inhaler, Use as instructed, Disp: 1 each, Rfl: 0 .  tiZANidine (ZANAFLEX) 4 MG tablet, TAKE 1 TABLET(4 MG) BY MOUTH EVERY 6 HOURS AS NEEDED FOR MUSCLE SPASMS, Disp: 60 tablet, Rfl: 0 .  Vitamin D, Ergocalciferol, (DRISDOL) 50000 units CAPS capsule, Take 50,000 Units by mouth once a week., Disp: , Rfl: 3 .  VYVANSE 50 MG capsule, Take 50 mg by mouth daily., Disp: , Rfl: 0   Vital signs in last 24 hrs: There were no vitals filed for this visit.  Physical Exam  ***  HEENT: sclera anicteric, oral mucosa moist without lesions  Neck: supple, no thyromegaly, JVD or lymphadenopathy  Cardiac: RRR without murmurs, S1S2 heard, no peripheral edema  Pulm: clear to auscultation bilaterally, normal RR and effort noted  Abdomen:  soft, *** tenderness, with active bowel sounds. No guarding or palpable hepatosplenomegaly.  Skin; warm and dry, no jaundice or rash  Recent Labs:  Hgb A1C = 13  Radiologic studies:    @ASSESSMENTPLANBEGIN @ Assessment: No diagnosis found.    Plan:    Total time *** minutes, over half spent face-to-face with patient in counseling and coordination of care.   Nelida Meuse III

## 2019-03-04 ENCOUNTER — Other Ambulatory Visit: Payer: Self-pay | Admitting: Internal Medicine

## 2019-03-04 DIAGNOSIS — J0110 Acute frontal sinusitis, unspecified: Secondary | ICD-10-CM

## 2019-03-11 ENCOUNTER — Ambulatory Visit: Payer: Medicaid Other | Admitting: Endocrinology

## 2019-03-12 ENCOUNTER — Other Ambulatory Visit: Payer: Self-pay

## 2019-03-12 ENCOUNTER — Encounter: Payer: Self-pay | Admitting: Adult Health

## 2019-03-12 ENCOUNTER — Other Ambulatory Visit: Payer: Self-pay | Admitting: Internal Medicine

## 2019-03-12 ENCOUNTER — Ambulatory Visit: Payer: Medicaid Other | Admitting: Adult Health

## 2019-03-12 ENCOUNTER — Telehealth: Payer: Self-pay

## 2019-03-12 DIAGNOSIS — J849 Interstitial pulmonary disease, unspecified: Secondary | ICD-10-CM | POA: Diagnosis not present

## 2019-03-12 DIAGNOSIS — J4540 Moderate persistent asthma, uncomplicated: Secondary | ICD-10-CM

## 2019-03-12 DIAGNOSIS — G4733 Obstructive sleep apnea (adult) (pediatric): Secondary | ICD-10-CM

## 2019-03-12 MED ORDER — ALBUTEROL SULFATE (2.5 MG/3ML) 0.083% IN NEBU: 2.5000 mg | INHALATION_SOLUTION | Freq: Four times a day (QID) | RESPIRATORY_TRACT | 1 refills | Status: AC | PRN

## 2019-03-12 NOTE — Assessment & Plan Note (Signed)
Mild intermittent asthma versus upper airway cough syndrome-appears to have had a recent flare but seems to be under control.  May use her rescue inhaler as needed.  If continues to have symptoms may need to consider placing on a maintenance regimen.  Previous spirometry showed no airflow obstruction.  And exhaled nitric oxide testing was low. Will check PFTs on return Control for triggers  Plan  Patient Instructions  Delsym 2 tsp Twice daily  As needed  Cough.  Mucinex Twice daily  As needed  Cough As needed   May use Duoneb every 6hr as needed for cough/wheezing .  Continue on Oxygen 2l/m  Order new CPAP machine (CPAP 10-20 cmH2O 0  Wear CPAP At bedtime  For at least 4-6 hr  Work on healthy weight .  Do not drive if sleepy .  Order for new neb machine  Follow up with Dr. Vaughan Browner in 4-6 weeks with PFT and As needed   Please contact office for sooner follow up if symptoms do not improve or worsen or seek emergency care

## 2019-03-12 NOTE — Assessment & Plan Note (Signed)
Healthy weight loss is key

## 2019-03-12 NOTE — Progress Notes (Signed)
@Patient  ID: Courtney Zamora, female    DOB: Aug 31, 1985, 33 y.o.   MRN: [id number]  Chief Complaint  Patient presents with  . Follow-up    ILD    Referring provider: Simona Huh, NP  HPI: 33 year old female never smoker followed for rheumatoid arthritis related ILD and obstructive sleep apnea  TEST/EVENTS :  CT chest 08/10/17-very mild nonspecific groundglass opacity.  Dilation of pulmonic trunk with possible pulmonary arterial hypertension, hepatic steatosis, nodular densities in the distal third of the esophagus concerning for small polyps. CTA 09/05/17-groundglass opacity with air trapping, severe hepatic steatosis. CTA 11/23/2017- no pulmonary embolism, no acute infiltrate, air-trapping. CTA 04/09/2018- patchy bilateral groundglass opacities, consolidation, hepatic steatosis. I have reviewed the images personally.  PFTs Spirometry 05/01/17 FVC 2.53 [67%], FEV1 2.28 [72%], F/F 90 No obstruction, restriction possible.  PFTs 10/19/17 FVC 2.43 [71%], FEV1 2.20 [76%], F/F 90, TLC 93% DLCO 105%, DLCO/VA 156% Normal spirometry.  No restriction.  Increased diffusion capacity.  FENO 05/01/17-7 FENO 06/19/17- 5 FENO 07/28/17- 7 FENO 08/14/17-5  Sleep PSG 10/02/17 Titration  09/20/17 moderate sleep apnea with an AHI of 20 and SaO2 low of 56%.  Sleep apnea, CPAP titration-recommended auto CPAP 10-21 cm  Cardiac Echocardiogram 08/17/17 LVEF 08-65%, no diastolic dysfunction.  PA systolic pressure within normal limits.  Labs 10/20/2017-ANA negative, CCP greater than 250, rheumatoid factor greater than 650, ANCA- Negative CBC 04/09/2018-WBC 11.7, eos 2%, absolute eosinophil count 234  03/12/2019 Follow up : RA-ILD  Patient returns for follow up. Last seen in 04/2018. Had changed to Pulmonary in [facility] but says this did not work out. Wants to reestablish with our clinic .  Patient has rheumatoid related ILD.  Currently on Orencia , Prednisone 10mg  and  CellCept 500mg  Twice daily   Previously intolerant to methotrexate and Plaquenil.Marland Kitchen Previously on Enbrel . Joint pain is better controlled on present regimen .  Says had a increased cough 2 weeks ago, started using nebs that helped. Cough is better now . Has dyspnea with minimal activity  .  Following with GI for recurrent vomiting .   She has obstructive sleep apnea . She was ordered a CPAP machine last office visit. Says she did not receive this . She needs CPAP . Now has insurance . Has never been on CPAP . has daytime sleepiness . Snores .  Previous sleep study showed moderate sleep apnea, CPAP titration study showed optimal control at 10 to 21 cm H2O.  Remains on oxygen 2l/m .   Allergies  Allergen Reactions  . Bee Venom Anaphylaxis  . Cinnamon Anaphylaxis  . Doxycycline Hives  . Ibuprofen Other (See Comments)    Abdominal pain  . Lexapro [Escitalopram Oxalate] Other (See Comments)    Generalized body shaking. ? sz  . Latex Rash  . Tape Hives, Rash and Other (See Comments)    EKG STICKERS    There is no immunization history for the selected administration types on file for this patient.  Past Medical History:  Diagnosis Date  . Anxiety   . Asthma   . Chronic headache    "1-2/week" (04/09/2018)  . Chronic lower back pain   . Depression   . Essential hypertension   . Family history of adverse reaction to anesthesia    "mom would get PONV" (04/09/2018)  . GERD (gastroesophageal reflux disease)   . Manic state ([place])   . Migraine    "a few/year" (04/09/2018)  . Obesity   . OSA on CPAP   .  Osteoarthritis, knee   . Pneumonia    "this is my 2nd time" (04/09/2018)  . Prolonged QT syndrome   . PTSD (post-traumatic stress disorder)   . Rheumatoid arthritis (HCC)   . Seizures (HCC)    "stress induced; 1 in this past year" (04/09/2018)  . Severe needle phobia   . Type II diabetes mellitus (HCC)     Tobacco History: Social History   Tobacco Use  Smoking Status Never Smoker  Smokeless Tobacco Never  Used   Counseling given: Not Answered   Outpatient Medications Prior to Visit  Medication Sig Dispense Refill  . acetaminophen-codeine (TYLENOL #3) 300-30 MG tablet Take by mouth every 4 (four) hours as needed for moderate pain.    Marland Kitchen. albuterol (PROVENTIL HFA;VENTOLIN HFA) 108 (90 Base) MCG/ACT inhaler Inhale 2 puffs into the lungs every 6 (six) hours as needed for wheezing or shortness of breath (wheezing). For shortness of breath. 1 Inhaler 11  . albuterol (PROVENTIL) (2.5 MG/3ML) 0.083% nebulizer solution Take 3 mLs (2.5 mg total) by nebulization every 6 (six) hours as needed for wheezing or shortness of breath. 150 mL 1  . atenolol (TENORMIN) 25 MG tablet Take 0.5 tablets (12.5 mg total) by mouth daily. 30 tablet 3  . BELBUCA 450 MCG FILM 900 mg.   0  . BELBUCA 900 MCG FILM DISSOLVE 1 FILM INSIDE OF CHEEKS Q 12 HOURS  0  . Biotin w/ Vitamins C & E (HAIR/SKIN/NAILS PO) Take 1 tablet by mouth daily.    . Calcium Citrate 200 MG TABS Take 2 tablets (400 mg total) by mouth every other day. (Patient taking differently: Take 200 mg by mouth daily. )    . cetirizine (ZYRTEC) 10 MG tablet TAKE 1 TABLET BY MOUTH EVERY NIGHT AT BEDTIME (Patient taking differently: Take 10 mg by mouth at bedtime. ) 30 tablet 0  . Crisaborole (EUCRISA) 2 % OINT Apply topically.    . DULoxetine (CYMBALTA) 30 MG capsule TAKE 1 CAPSULE BY MOUTH TWICE DAILY (Patient taking differently: Take 30 mg by mouth 2 (two) times daily. ) 60 capsule 2  . etanercept (ENBREL SURECLICK) 50 MG/ML injection Inject 50 mg as directed once a week.     . famotidine (PEPCID) 20 MG tablet TK 1 T PO BID  0  . furosemide (LASIX) 20 MG tablet TAKE 1 TABLET BY MOUTH EVERY OTHER DAY (Patient taking differently: Take 20 mg by mouth every other day. ) 30 tablet 3  . gabapentin (NEURONTIN) 300 MG capsule TAKE 1 CAPSULE BY MOUTH EVERY MORNING, 1 CAPSULE BY MOUTH AT NOON, AND 2 CAPSULES BY MOUTH AT BEDTIME (Patient taking differently: Take 300-600 mg by  mouth See admin instructions. TAKE 1 CAPSULE BY MOUTH EVERY MORNING, 1 CAPSULE BY MOUTH AT NOON, AND 2 CAPSULES BY MOUTH AT BEDTIME) 120 capsule 5  . glucose blood (ACCU-CHEK AVIVA PLUS) test strip UAD UP TO TID TO CHECK BLOOD SUGAR 100 each 12  . HUMIRA 40 MG/0.4ML PSKT INJECT 1 PEN UNDER THE SKIN Q 14 DAYS  3  . hydrochlorothiazide (HYDRODIURIL) 25 MG tablet Take 1 tablet (25 mg total) by mouth daily. 90 tablet 3  . hydrOXYzine (ATARAX/VISTARIL) 10 MG tablet TAKE 1 TABLET(10 MG) BY MOUTH THREE TIMES DAILY AS NEEDED 30 tablet 2  . insulin aspart (NOVOLOG) 100 UNIT/ML injection Inject 150 Units into the skin 3 (three) times daily with meals. 140 mL 11  . Insulin Glargine, 2 Unit Dial, (TOUJEO MAX SOLOSTAR) 300 UNIT/ML SOPN Inject 250  Units into the skin every morning. 10 pen 11  . Insulin Pen Needle (B-D ULTRAFINE III SHORT PEN) 31G X 8 MM MISC Use to inject insulin once daily 50 each 2  . ipratropium-albuterol (DUONEB) 0.5-2.5 (3) MG/3ML SOLN Take 3 mLs by nebulization every 6 (six) hours as needed. (Patient taking differently: Take 3 mLs by nebulization every 4 (four) hours as needed (for shortness of breath or wheezing). ) 360 mL 5  . montelukast (SINGULAIR) 10 MG tablet TAKE 1 TABLET(10 MG) BY MOUTH DAILY 30 tablet 2  . Multiple Vitamins-Minerals (WOMENS MULTI) CAPS Take 1 capsule by mouth daily.    . mycophenolate (CELLCEPT) 500 MG tablet Take 500 mg by mouth 2 (two) times daily.  2  . nabumetone (RELAFEN) 500 MG tablet Take 1 tablet (500 mg total) by mouth 2 (two) times daily as needed. 60 tablet 3  . Omega-3 Fatty Acids (FISH OIL) 1000 MG CAPS Take 1 capsule by mouth daily.    Marland Kitchen. oxyCODONE-acetaminophen (PERCOCET/ROXICET) 5-325 MG tablet TK 1 OR 2 TS PO Q  6 H PRN P  0  . pantoprazole (PROTONIX) 40 MG tablet TAKE 1 TABLET(40 MG) BY MOUTH TWICE DAILY BEFORE A MEAL 60 tablet 0  . phentermine 15 MG capsule Take 15 mg by mouth every morning.    . promethazine (PHENERGAN) 25 MG tablet Take 1 tablet  (25 mg total) by mouth every 8 (eight) hours as needed for nausea or vomiting. 30 tablet 2  . Respiratory Therapy Supplies (FLUTTER) DEVI As directed. 1 each 0  . RYBELSUS 14 MG TABS Take 14 mg by mouth daily.    Marland Kitchen. Spacer/Aero-Holding Chambers (AEROCHAMBER MV) inhaler Use as instructed 1 each 0  . tiZANidine (ZANAFLEX) 4 MG tablet TAKE 1 TABLET(4 MG) BY MOUTH EVERY 6 HOURS AS NEEDED FOR MUSCLE SPASMS 60 tablet 0  . Vitamin D, Ergocalciferol, (DRISDOL) 50000 units CAPS capsule Take 50,000 Units by mouth once a week.  3  . VYVANSE 50 MG capsule Take 50 mg by mouth daily.  0   No facility-administered medications prior to visit.      Review of Systems:   Constitutional:   No  weight loss, night sweats,  Fevers, chills,  +fatigue, or  lassitude.  HEENT:   No headaches,  Difficulty swallowing,  Tooth/dental problems, or  Sore throat,                No sneezing, itching, ear ache,  +nasal congestion, post nasal drip,   CV:  No chest pain,  Orthopnea, PND, +swelling in lower extremities,  No nasarca, dizziness, palpitations, syncope.   GI  +indigestion, abdominal pain, nausea, vomiting,  No diarrhea, change in bowel habits, loss of appetite, bloody stools.   Resp: .  No chest wall deformity  Skin: no rash or lesions.  GU: no dysuria, change in color of urine, no urgency or frequency.  No flank pain, no hematuria   MS:  No joint pain or swelling.  No decreased range of motion.  No back pain.    Physical Exam  BP 138/78 (BP Location: Left Arm, Patient Position: Sitting, Cuff Size: Normal)   Pulse 100   Temp 98.4 F (36.9 C)   Ht 5\' 4"  (1.626 m)   Wt (!) 374 lb (169.6 kg)   SpO2 94% Comment: 2L O2  BMI 64.20 kg/m   GEN: A/Ox3; pleasant , NAD, morbidly obese   HEENT:  [place]/AT, , NOSE-clear, THROAT-clear, no lesions, no postnasal drip or  exudate noted.  Class III MP airway  NECK:  Supple w/ fair ROM; no JVD; normal carotid impulses w/o bruits; no thyromegaly or nodules  palpated; no lymphadenopathy.    RESP  Clear  P & A; w/o, wheezes/ rales/ or rhonchi. no accessory muscle use, no dullness to percussion  CARD:  RRR, no m/r/g, tr peripheral edema, pulses intact, no cyanosis or clubbing.  GI:   Soft & nt; nml bowel sounds; no organomegaly or masses detected.  Large panniculus   Musco: Warm bil, no deformities or joint swelling noted.   Neuro: alert, no focal deficits noted.    Skin: Warm, no lesions or rashes    Lab Results:  CBC    Component Value Date/Time   WBC 10.6 (H) 01/23/2019 1106   RBC 5.21 (H) 01/23/2019 1106   HGB 15.2 (H) 01/23/2019 1106   HCT 47.6 (H) 01/23/2019 1106   PLT 230.0 01/23/2019 1106   MCV 91.2 01/23/2019 1106   MCH 25.5 (L) 04/10/2018 0717   MCHC 31.9 01/23/2019 1106   RDW 14.7 01/23/2019 1106   LYMPHSABS 2.3 01/23/2019 1106   MONOABS 0.9 01/23/2019 1106   EOSABS 0.1 01/23/2019 1106   BASOSABS 0.1 01/23/2019 1106    BMET    Component Value Date/Time   NA 137 01/23/2019 1106   NA 141 10/20/2017 1123   K 3.1 (L) 01/23/2019 1106   CL 90 (L) 01/23/2019 1106   CO2 33 (H) 01/23/2019 1106   GLUCOSE 190 (H) 01/23/2019 1106   BUN 16 01/23/2019 1106   BUN 23 (H) 10/20/2017 1123   CREATININE 0.81 01/23/2019 1106   CALCIUM 10.2 01/23/2019 1106   GFRNONAA >60 04/10/2018 0717   GFRAA >60 04/10/2018 0717    BNP    Component Value Date/Time   BNP 23.4 04/09/2018 0545    ProBNP No results found for: PROBNP  Imaging:     PFT Results Latest Ref Rng & Units 10/27/2017  FVC-Pre L 2.40  FVC-Predicted Pre % 70  FVC-Post L 2.43  FVC-Predicted Post % 71  Pre FEV1/FVC % % 91  Post FEV1/FCV % % 90  FEV1-Pre L 2.18  FEV1-Predicted Pre % 76  FEV1-Post L 2.20  DLCO UNC% % 105  DLCO COR %Predicted % 156  TLC L 4.29  TLC % Predicted % 93  RV % Predicted % 135    Lab Results  Component Value Date   NITRICOXIDE 7 07/28/2017        Assessment & Plan:   ILD (interstitial lung disease) (HCC)  Rheumatoid arthritis related ILD Will need follow-up PFT with DLCO.  Would like to get a 6-minute walk test but patient says she is unable to walk any amount of distance.  Currently O2 sats are adequate. Recent chest x-ray per patient was reported as stable.  Pending PFT results will determine if she needs a repeat high-resolution CT chest to look for ILD progression Continue follow-up with rheumatology and treatment aimed towards rheumatoid arthritis she is currently on Orencia, prednisone 10 mg and CellCept.  Plan  Patient Instructions  Delsym 2 tsp Twice daily  As needed  Cough.  Mucinex Twice daily  As needed  Cough As needed   May use Duoneb every 6hr as needed for cough/wheezing .  Continue on Oxygen 2l/m  Order new CPAP machine (CPAP 10-20 cmH2O 0  Wear CPAP At bedtime  For at least 4-6 hr  Work on healthy weight .  Do not drive if sleepy .  Order for new neb machine  Follow up with Dr. Isaiah Serge in 4-6 weeks with PFT and As needed   Please contact office for sooner follow up if symptoms do not improve or worsen or seek emergency care        Morbid (severe) obesity due to excess calories (HCC) Healthy weight loss is key  OSA (obstructive sleep apnea) Moderate obstructive sleep apnea-needs to begin CPAP.  CPAP titration study showed optimal pressure at 10 to 21 cm H2O.  Order for new CPAP machine has been sent today.  Will get CPAP download on return.  Plan  Patient Instructions  Delsym 2 tsp Twice daily  As needed  Cough.  Mucinex Twice daily  As needed  Cough As needed   May use Duoneb every 6hr as needed for cough/wheezing .  Continue on Oxygen 2l/m  Order new CPAP machine (CPAP 10-20 cmH2O 0  Wear CPAP At bedtime  For at least 4-6 hr  Work on healthy weight .  Do not drive if sleepy .  Order for new neb machine  Follow up with Dr. Isaiah Serge in 4-6 weeks with PFT and As needed   Please contact office for sooner follow up if symptoms do not improve or worsen or seek  emergency care        Asthma, moderate persistent Mild intermittent asthma versus upper airway cough syndrome-appears to have had a recent flare but seems to be under control.  May use her rescue inhaler as needed.  If continues to have symptoms may need to consider placing on a maintenance regimen.  Previous spirometry showed no airflow obstruction.  And exhaled nitric oxide testing was low. Will check PFTs on return Control for triggers  Plan  Patient Instructions  Delsym 2 tsp Twice daily  As needed  Cough.  Mucinex Twice daily  As needed  Cough As needed   May use Duoneb every 6hr as needed for cough/wheezing .  Continue on Oxygen 2l/m  Order new CPAP machine (CPAP 10-20 cmH2O 0  Wear CPAP At bedtime  For at least 4-6 hr  Work on healthy weight .  Do not drive if sleepy .  Order for new neb machine  Follow up with Dr. Isaiah Serge in 4-6 weeks with PFT and As needed   Please contact office for sooner follow up if symptoms do not improve or worsen or seek emergency care           Rubye Oaks, NP 03/12/2019

## 2019-03-12 NOTE — Assessment & Plan Note (Signed)
Rheumatoid arthritis related ILD Will need follow-up PFT with [facility].  Would like to get a 6-minute walk test but patient says she is unable to walk any amount of distance.  Currently O2 sats are adequate. Recent chest x-ray per patient was reported as stable.  Pending PFT results will determine if she needs a repeat high-resolution CT chest to look for ILD progression Continue follow-up with rheumatology and treatment aimed towards rheumatoid arthritis she is currently on Orencia, prednisone 10 mg and CellCept.  Plan  Patient Instructions  Delsym 2 tsp Twice daily  As needed  Cough.  Mucinex Twice daily  As needed  Cough As needed   May use Duoneb every 6hr as needed for cough/wheezing .  Continue on Oxygen 2l/m  Order new CPAP machine (CPAP 10-20 cmH2O 0  Wear CPAP At bedtime  For at least 4-6 hr  Work on healthy weight .  Do not drive if sleepy .  Order for new neb machine  Follow up with Dr. Vaughan Browner in 4-6 weeks with PFT and As needed   Please contact office for sooner follow up if symptoms do not improve or worsen or seek emergency care

## 2019-03-12 NOTE — Telephone Encounter (Signed)
Patient has order for PFT and OV with Dr. Vaughan Browner in 4-6 weeks.  Routed to [employer] for scheduling.

## 2019-03-12 NOTE — Patient Instructions (Addendum)
Delsym 2 tsp Twice daily  As needed  Cough.  Mucinex Twice daily  As needed  Cough As needed   May use Duoneb every 6hr as needed for cough/wheezing .  Continue on Oxygen 2l/m  Order new CPAP machine (CPAP 10-20 cmH2O 0  Wear CPAP At bedtime  For at least 4-6 hr  Work on healthy weight .  Do not drive if sleepy .  Order for new neb machine  Follow up with Dr. Vaughan Browner in 4-6 weeks with PFT and As needed   Please contact office for sooner follow up if symptoms do not improve or worsen or seek emergency care

## 2019-03-12 NOTE — Assessment & Plan Note (Signed)
Moderate obstructive sleep apnea-needs to begin CPAP.  CPAP titration study showed optimal pressure at 10 to 21 cm H2O.  Order for new CPAP machine has been sent today.  Will get CPAP download on return.  Plan  Patient Instructions  Delsym 2 tsp Twice daily  As needed  Cough.  Mucinex Twice daily  As needed  Cough As needed   May use Duoneb every 6hr as needed for cough/wheezing .  Continue on Oxygen 2l/m  Order new CPAP machine (CPAP 10-20 cmH2O 0  Wear CPAP At bedtime  For at least 4-6 hr  Work on healthy weight .  Do not drive if sleepy .  Order for new neb machine  Follow up with Dr. Vaughan Browner in 4-6 weeks with PFT and As needed   Please contact office for sooner follow up if symptoms do not improve or worsen or seek emergency care

## 2019-03-12 NOTE — Addendum Note (Signed)
Addended by: Joella Prince on: 03/12/2019 05:25 PM   Modules accepted: Orders

## 2019-03-13 ENCOUNTER — Telehealth: Payer: Self-pay | Admitting: Adult Health

## 2019-03-13 DIAGNOSIS — G4733 Obstructive sleep apnea (adult) (pediatric): Secondary | ICD-10-CM

## 2019-03-13 NOTE — Telephone Encounter (Signed)
Message routed to T. Parrett, NP for review and recommendations.  Tammy please see note from [employer] regarding order for CPAP please  LOV 03/12/19 T. Parrett Patient Instructions  Delsym 2 tsp Twice daily  As needed  Cough.  Mucinex Twice daily  As needed  Cough As needed   May use Duoneb every 6hr as needed for cough/wheezing .  Continue on Oxygen 2l/m  Order new CPAP machine (CPAP 10-20 cmH2O 0  Wear CPAP At bedtime  For at least 4-6 hr  Work on healthy weight .  Do not drive if sleepy .  Order for new neb machine  Follow up with Dr. Vaughan Browner in 4-6 weeks with PFT and As needed   Please contact office for sooner follow up if symptoms do not improve or worsen or seek emergency care

## 2019-03-13 NOTE — Telephone Encounter (Signed)
Courtney Zamora, Drema Halon, Wyatt Haste; [place], [place]; [place], [place], the patient will have to start the process over again. [place] [facility] will only accept a sleep study that is 1 year or less. Her sleep study was done in March 2019.  She will need an OV that speaks of her sleep issues and then a new diagnostic baseline study. [place] Medicaid does not accept home sleep studies, so this will have to be scheduled in the lab.  Can you please advise Rexene Edison, NP of this?  Thank you.

## 2019-03-14 ENCOUNTER — Other Ambulatory Visit: Payer: Self-pay

## 2019-03-14 ENCOUNTER — Telehealth: Payer: Self-pay | Admitting: Endocrinology

## 2019-03-14 NOTE — Telephone Encounter (Signed)
Per insurance will not cover new CPAP   Needs new sleep study  Home sleep study is fine with me but do not believe Medicaid will cover this so set up Split night if HST not covered

## 2019-03-14 NOTE — Telephone Encounter (Signed)
Split night ordered 

## 2019-03-14 NOTE — Telephone Encounter (Signed)
I don't have any paperwork for [facility] but CMN.Rx order for [employer] was completed as well as office notes faxed (BOTH) on 03/07/19 @ 10:45am.

## 2019-03-14 NOTE — Telephone Encounter (Signed)
Patient is calling to check status of the order from her DME provider for her [facility] products.  Have we received the request? Have we responded yet?  Please advise at [phone]

## 2019-03-15 NOTE — Telephone Encounter (Signed)
Called and left message on pt vm to call back to schedule pft -pr °

## 2019-03-18 ENCOUNTER — Ambulatory Visit: Payer: Medicaid Other | Admitting: Endocrinology

## 2019-03-19 NOTE — Telephone Encounter (Signed)
Scheduled pft 03/29/2019 -pr

## 2019-03-21 ENCOUNTER — Telehealth: Payer: Self-pay | Admitting: Endocrinology

## 2019-03-21 NOTE — Telephone Encounter (Signed)
Please call pt and provide her with a follow up

## 2019-03-21 NOTE — Telephone Encounter (Signed)
Patient ph# [phone] called re: status of updated RX for Dexcom. Please call patient at the ph# listed above to advise.

## 2019-03-26 ENCOUNTER — Encounter (HOSPITAL_BASED_OUTPATIENT_CLINIC_OR_DEPARTMENT_OTHER): Payer: Medicaid Other | Admitting: Pulmonary Disease

## 2019-03-26 ENCOUNTER — Inpatient Hospital Stay (HOSPITAL_COMMUNITY): Admission: RE | Admit: 2019-03-26 | Payer: Medicaid Other | Source: Ambulatory Visit

## 2019-03-26 NOTE — Telephone Encounter (Signed)
Per Chris's suggestion, a new script was faxed to [facility] with order for pump and Dexcom.  This was done on 03/20/19

## 2019-03-27 ENCOUNTER — Other Ambulatory Visit: Payer: Self-pay | Admitting: Internal Medicine

## 2019-03-27 DIAGNOSIS — G8929 Other chronic pain: Secondary | ICD-10-CM

## 2019-03-27 NOTE — Telephone Encounter (Signed)
[facility] called and they report that the prescription is on file for the [facility] and is good until 9/21.  They are waiting on the authorization for the pump from her insurance company.  They have sent out dexcom sensors to her previosely, and need only for her to call and tell them that she is out.  I told her this.  She had no final questions.

## 2019-03-28 ENCOUNTER — Telehealth: Payer: Self-pay | Admitting: *Deleted

## 2019-03-28 ENCOUNTER — Ambulatory Visit: Payer: Medicaid Other | Admitting: Endocrinology

## 2019-03-28 NOTE — Telephone Encounter (Signed)
Can we reschedule her please

## 2019-03-28 NOTE — Telephone Encounter (Signed)
Attempted to return call to patient.  No answer. Left message to call.  Need to have patient re-scheduled for PFT and covid testing.  Previous note says covid testing never performed.  Will route to Hyman Hopes due to unsure of process to 're-schedule' a PFT.  A recall reminder for PFT was placed for next week as follow up reminder.  Nothing further needed at this time.

## 2019-03-28 NOTE — Telephone Encounter (Signed)
FYI: Placed reminder call to patient about PFT scheduled for tomorrow after finding that she never had covid testing completed. Left message  Pt called back and cancelled PFT she says appt was conflicting another scheduled appt.

## 2019-03-29 ENCOUNTER — Encounter (HOSPITAL_BASED_OUTPATIENT_CLINIC_OR_DEPARTMENT_OTHER): Payer: Medicaid Other | Admitting: Pulmonary Disease

## 2019-03-29 NOTE — Telephone Encounter (Signed)
L/m on pt vm to call back to schedule pft - pt having covid testing on 10/14-pr

## 2019-04-01 ENCOUNTER — Telehealth: Payer: Self-pay

## 2019-04-01 NOTE — Telephone Encounter (Signed)
Company: [employer]  Document: Request for Clinical Documents Other records requested:  C-Peptide with concurrent fasting glucose of 225 or below  All above requested information has been faxed successfully to [employer] listed above. Documents and fax confirmation have been placed in the faxed file for future reference.

## 2019-04-02 ENCOUNTER — Other Ambulatory Visit: Payer: Self-pay

## 2019-04-02 NOTE — Telephone Encounter (Signed)
Patient is scheduled for appointment on 04/04/19 at 11:15 a.m.

## 2019-04-02 NOTE — Telephone Encounter (Signed)
Received notification via fax from [facility] requesting Dr. Loanne Drilling complete Medicaid Rx Auth form for insulin pump. Per Dr. Loanne Drilling, he is unable to complete without a current appt. Routing message to front desk for scheduling purposes.

## 2019-04-02 NOTE — Telephone Encounter (Signed)
Document has been placed on Dr. Cordelia Pen desk for him to complete after appt. Will fax once completed and returned.

## 2019-04-04 ENCOUNTER — Ambulatory Visit (INDEPENDENT_AMBULATORY_CARE_PROVIDER_SITE_OTHER): Payer: Medicaid Other | Admitting: Endocrinology

## 2019-04-04 ENCOUNTER — Other Ambulatory Visit: Payer: Self-pay

## 2019-04-04 ENCOUNTER — Encounter: Payer: Self-pay | Admitting: Endocrinology

## 2019-04-04 VITALS — BP 122/64 | HR 115 | Ht 64.0 in | Wt 383.2 lb

## 2019-04-04 DIAGNOSIS — E1165 Type 2 diabetes mellitus with hyperglycemia: Secondary | ICD-10-CM | POA: Diagnosis not present

## 2019-04-04 DIAGNOSIS — IMO0001 Reserved for inherently not codable concepts without codable children: Secondary | ICD-10-CM

## 2019-04-04 LAB — POCT GLYCOSYLATED HEMOGLOBIN (HGB A1C): Hemoglobin A1C: 9.8 % — AB (ref 4.0–5.6)

## 2019-04-04 MED ORDER — INSULIN ASPART 100 UNIT/ML ~~LOC~~ SOLN: 50.0000 [IU] | Freq: Two times a day (BID) | SUBCUTANEOUS | 11 refills | Status: DC

## 2019-04-04 MED ORDER — DEXCOM G6 SENSOR MISC: 1.0000 | Freq: Every day | 3 refills | Status: AC | PRN

## 2019-04-04 MED ORDER — TOUJEO MAX SOLOSTAR 300 UNIT/ML ~~LOC~~ SOPN: 300.0000 [IU] | PEN_INJECTOR | SUBCUTANEOUS | Status: DC

## 2019-04-04 NOTE — Patient Instructions (Addendum)
check your blood sugar 5 times a day: before the 3 meals, and at bedtime.  also check if you have symptoms of your blood sugar being too high or too low.  please keep a record of the readings and bring it to your next appointment here (or you can bring the meter itself).  You can write it on any piece of paper.  please call us sooner if your blood sugar goes below 70, or if you have a lot of readings over 200.    Please take the Toujeo, 300 units daily, and:  Take novolog 50 units twice a day (with lunch and supper).   Please come back for a follow-up appointment in 4-6 weeks.

## 2019-04-04 NOTE — Progress Notes (Signed)
Subjective:    Patient ID: Courtney Zamora, female    DOB: [birth year], 33 y.o.   MRN: [id number]  HPI Pt returns for f/u of diabetes mellitus: DM type: Insulin-requiring type 2.   Dx'ed: 2019 Complications: none.   Therapy: insulin since dx.  GDM: never DKA: never Severe hypoglycemia: never Pancreatitis: never Pancreatic imaging: never. Other: she takes multiple daily injections; she takes chronic steroids for asthma.   Interval history: She takes Toujeo 300 units each morning, and she says she does not miss it.  She seldom takes the Novolog, because glucose is not high enough.  I reviewed continuous glucose monitor data.  Glucose varies from 70-350.  It is in general higher from 2 PM-12 MN.  Past Medical History:  Diagnosis Date  . Anxiety   . Asthma   . Chronic headache    "1-2/week" (04/09/2018)  . Chronic lower back pain   . Depression   . Essential hypertension   . Family history of adverse reaction to anesthesia    "mom would get PONV" (04/09/2018)  . GERD (gastroesophageal reflux disease)   . Manic state (HCC)   . Migraine    "a few/year" (04/09/2018)  . Obesity   . OSA on CPAP   . Osteoarthritis, knee   . Pneumonia    "this is my 2nd time" (04/09/2018)  . Prolonged QT syndrome   . PTSD (post-traumatic stress disorder)   . Rheumatoid arthritis (HCC)   . Seizures (HCC)    "stress induced; 1 in this past year" (04/09/2018)  . Severe needle phobia   . Type II diabetes mellitus (HCC)     Past Surgical History:  Procedure Laterality Date  . TONSILLECTOMY  1994    Social History   Socioeconomic History  . Marital status: Married    Spouse name: Not on file  . Number of children: Not on file  . Years of education: Not on file  . Highest education level: Not on file  Occupational History  . Not on file  Social Needs  . Financial resource strain: Not on file  . Food insecurity    Worry: Not on file    Inability: Not on file  . Transportation needs   Medical: Not on file    Non-medical: Not on file  Tobacco Use  . Smoking status: Never Smoker  . Smokeless tobacco: Never Used  Substance and Sexual Activity  . Alcohol use: Not Currently  . Drug use: Never  . Sexual activity: Not Currently    Birth control/protection: None  Lifestyle  . Physical activity    Days per week: Not on file    Minutes per session: Not on file  . Stress: Not on file  Relationships  . Social Musicianconnections    Talks on phone: Not on file    Gets together: Not on file    Attends religious service: Not on file    Active member of club or organization: Not on file    Attends meetings of clubs or organizations: Not on file    Relationship status: Not on file  . Intimate partner violence    Fear of current or ex partner: Not on file    Emotionally abused: Not on file    Physically abused: Not on file    Forced sexual activity: Not on file  Other Topics Concern  . Not on file  Social History Narrative  . Not on file    Current Outpatient Medications on File  Prior to Visit  Medication Sig Dispense Refill  . albuterol (PROVENTIL HFA;VENTOLIN HFA) 108 (90 Base) MCG/ACT inhaler Inhale 2 puffs into the lungs every 6 (six) hours as needed for wheezing or shortness of breath (wheezing). For shortness of breath. 1 Inhaler 11  . albuterol (PROVENTIL) (2.5 MG/3ML) 0.083% nebulizer solution Take 3 mLs (2.5 mg total) by nebulization every 6 (six) hours as needed for wheezing or shortness of breath. 150 mL 1  . atenolol (TENORMIN) 25 MG tablet Take 0.5 tablets (12.5 mg total) by mouth daily. 30 tablet 3  . BELBUCA 900 MCG FILM DISSOLVE 1 FILM INSIDE OF CHEEKS Q 12 HOURS  0  . Biotin w/ Vitamins C & E (HAIR/SKIN/NAILS PO) Take 1 tablet by mouth daily.    . Calcium Citrate 200 MG TABS Take 2 tablets (400 mg total) by mouth every other day. (Patient taking differently: Take 200 mg by mouth daily. )    . cetirizine (ZYRTEC) 10 MG tablet TAKE 1 TABLET BY MOUTH EVERY NIGHT AT  BEDTIME (Patient taking differently: Take 10 mg by mouth at bedtime. ) 30 tablet 0  . Crisaborole (EUCRISA) 2 % OINT Apply topically.    . DULoxetine (CYMBALTA) 30 MG capsule TAKE 1 CAPSULE BY MOUTH TWICE DAILY (Patient taking differently: Take 30 mg by mouth 2 (two) times daily. ) 60 capsule 2  . famotidine (PEPCID) 20 MG tablet TK 1 T PO BID  0  . furosemide (LASIX) 20 MG tablet TAKE 1 TABLET BY MOUTH EVERY OTHER DAY (Patient taking differently: Take 20 mg by mouth every other day. ) 30 tablet 3  . gabapentin (NEURONTIN) 300 MG capsule TAKE 1 CAPSULE BY MOUTH EVERY MORNING, 1 CAPSULE BY MOUTH AT NOON, AND 2 CAPSULES BY MOUTH AT BEDTIME (Patient taking differently: Take 300-600 mg by mouth See admin instructions. TAKE 1 CAPSULE BY MOUTH EVERY MORNING, 1 CAPSULE BY MOUTH AT NOON, AND 2 CAPSULES BY MOUTH AT BEDTIME) 120 capsule 5  . glucose blood (ACCU-CHEK AVIVA PLUS) test strip UAD UP TO TID TO CHECK BLOOD SUGAR 100 each 12  . hydrochlorothiazide (HYDRODIURIL) 25 MG tablet Take 1 tablet (25 mg total) by mouth daily. 90 tablet 3  . hydrOXYzine (ATARAX/VISTARIL) 10 MG tablet TAKE 1 TABLET(10 MG) BY MOUTH THREE TIMES DAILY AS NEEDED 30 tablet 2  . Insulin Pen Needle (B-D ULTRAFINE III SHORT PEN) 31G X 8 MM MISC Use to inject insulin once daily 50 each 2  . ipratropium-albuterol (DUONEB) 0.5-2.5 (3) MG/3ML SOLN Take 3 mLs by nebulization every 6 (six) hours as needed. (Patient taking differently: Take 3 mLs by nebulization every 4 (four) hours as needed (for shortness of breath or wheezing). ) 360 mL 5  . montelukast (SINGULAIR) 10 MG tablet TAKE 1 TABLET(10 MG) BY MOUTH DAILY 30 tablet 2  . Multiple Vitamins-Minerals (WOMENS MULTI) CAPS Take 1 capsule by mouth daily.    . mycophenolate (CELLCEPT) 500 MG tablet Take 500 mg by mouth 2 (two) times daily.  2  . nabumetone (RELAFEN) 500 MG tablet Take 1 tablet (500 mg total) by mouth 2 (two) times daily as needed. 60 tablet 3  . Omega-3 Fatty Acids (FISH  OIL) 1000 MG CAPS Take 1 capsule by mouth daily.    Marland Kitchen oxyCODONE-acetaminophen (PERCOCET/ROXICET) 5-325 MG tablet TK 1 OR 2 TS PO Q  6 H PRN P  0  . pantoprazole (PROTONIX) 40 MG tablet TAKE 1 TABLET(40 MG) BY MOUTH TWICE DAILY BEFORE A MEAL 60 tablet 0  .  phentermine 15 MG capsule Take 15 mg by mouth every morning.    . promethazine (PHENERGAN) 25 MG tablet Take 1 tablet (25 mg total) by mouth every 8 (eight) hours as needed for nausea or vomiting. 30 tablet 2  . Respiratory Therapy Supplies (FLUTTER) DEVI As directed. 1 each 0  . RYBELSUS 14 MG TABS Take 14 mg by mouth daily.    Marland Kitchen Spacer/Aero-Holding Chambers (AEROCHAMBER MV) inhaler Use as instructed 1 each 0  . tiZANidine (ZANAFLEX) 4 MG tablet TAKE 1 TABLET(4 MG) BY MOUTH EVERY 6 HOURS AS NEEDED FOR MUSCLE SPASMS 60 tablet 0  . Vitamin D, Ergocalciferol, (DRISDOL) 50000 units CAPS capsule Take 50,000 Units by mouth once a week.  3  . VYVANSE 50 MG capsule Take 50 mg by mouth daily.  0   No current facility-administered medications on file prior to visit.     Allergies  Allergen Reactions  . Bee Venom Anaphylaxis  . Cinnamon Anaphylaxis  . Doxycycline Hives  . Ibuprofen Other (See Comments)    Abdominal pain  . Lexapro [Escitalopram Oxalate] Other (See Comments)    Generalized body shaking. ? sz  . Latex Rash  . Tape Hives, Rash and Other (See Comments)    EKG STICKERS    Family History  Problem Relation Age of Onset  . Hypothyroidism Mother   . Depression Mother   . Anxiety disorder Mother   . Diabetes Father   . Diabetes Paternal Uncle   . Hypertension Maternal Grandmother   . Hypertension Maternal Grandfather   . Pancreatic cancer Maternal Grandfather   . Hypertension Paternal Grandmother   . Heart disease Paternal Grandfather   . Hypertension Paternal Grandfather     BP 122/64 (BP Location: Right Wrist, Patient Position: Sitting, Cuff Size: Large)   Pulse (!) 115   Ht 5\' 4"  (1.626 m)   Wt (!) 383 lb 3.2 oz (173.8  kg)   SpO2 96%   BMI 65.78 kg/m    Review of Systems She denies LOC.      Objective:   Physical Exam VITAL SIGNS:  See vs page GENERAL: no distress.  Has 02 on.   Pulses: dorsalis pedis intact bilat.    MSK: no deformity of the feet.  CV: 1+ bilat leg edema.  Skin:  no ulcer on the feet.  normal color and temp on the feet. Neuro: sensation is intact to touch on the feet.   Ext: there is bilateral onychomycosis of the toenails.    Lab Results  Component Value Date   HGBA1C 9.8 (A) 04/04/2019        Assessment & Plan:  Insulin-requiring type 2 DM: she needs increased rx Hypoglycemia: this limits aggressiveness of glycemic control Tachycardia: pt signs release of info for labs from [facility]  Patient Instructions  check your blood sugar 5 times a day: before the 3 meals, and at bedtime.  also check if you have symptoms of your blood sugar being too high or too low.  please keep a record of the readings and bring it to your next appointment here (or you can bring the meter itself).  You can write it on any piece of paper.  please call us sooner if your blood sugar goes below 70, or if you have a lot of readings over 200.    Please take the Toujeo, 300 units daily, and:  Take novolog 50 units twice a day (with lunch and supper).   Please come back for  a follow-up appointment in 4-6 weeks.

## 2019-04-04 NOTE — Telephone Encounter (Signed)
Company: [employer]  Document: Medical Necessity Form (faxed 04/04/19)  Other records requested: Chart Notes (faxed OV dated 04/04/19); Fasting glucose and C-Peptide (faxed 04/01/19); Medicaid Rx Auth Form - (faxed to [place] and [place])  All above requested information has been faxed successfully to [place] Medicaid Form faxed to [place] and [place]. Documents and fax confirmation have been placed in the faxed file for future reference.

## 2019-04-05 ENCOUNTER — Ambulatory Visit: Payer: Medicaid Other | Admitting: Endocrinology

## 2019-04-05 NOTE — Telephone Encounter (Signed)
L/m on pt vm to call back to r/s pft -pr  °

## 2019-04-08 NOTE — Telephone Encounter (Signed)
L/m on pt vm to call back to r/s pft - 3rd attempt - closed encounter-pr

## 2019-04-23 ENCOUNTER — Other Ambulatory Visit: Payer: Self-pay | Admitting: Internal Medicine

## 2019-04-23 DIAGNOSIS — I10 Essential (primary) hypertension: Secondary | ICD-10-CM

## 2019-04-24 ENCOUNTER — Telehealth: Payer: Self-pay

## 2019-04-24 NOTE — Telephone Encounter (Signed)
Company: [employer]  Document: Request for Fasting glucose and C-peptide Other records requested: Copies of Fasting glucose and C-peptide signed and dated  All above requested information has been faxed successfully to [employer] listed above. Documents and fax confirmation have been placed in the faxed file for future reference.

## 2019-04-30 ENCOUNTER — Telehealth: Payer: Self-pay

## 2019-04-30 NOTE — Telephone Encounter (Signed)
Company: [employer]  Document: [place] form Other records requested: Specifically, line #30 was incomplete. Request made for Dr. Loanne Drilling to complete and return with his signature and date  All above requested information has been faxed successfully to the Company listed above. Documents and fax confirmation have been placed in the faxed file for future reference.

## 2019-05-01 ENCOUNTER — Other Ambulatory Visit (HOSPITAL_COMMUNITY)
Admission: RE | Admit: 2019-05-01 | Discharge: 2019-05-01 | Disposition: A | Discharge status: Home / Self Care | Payer: Medicaid Other | Source: Ambulatory Visit | Attending: Pulmonary Disease | Admitting: Pulmonary Disease

## 2019-05-01 NOTE — Progress Notes (Signed)
Pt contacted regarding missed covid appointment. VM left with callback number for rescheduling.   Jacqlyn Larsen, RN

## 2019-05-03 ENCOUNTER — Encounter (HOSPITAL_BASED_OUTPATIENT_CLINIC_OR_DEPARTMENT_OTHER): Payer: Medicaid Other | Admitting: Pulmonary Disease

## 2019-05-06 ENCOUNTER — Ambulatory Visit: Payer: Medicaid Other | Admitting: Obstetrics and Gynecology

## 2019-05-08 ENCOUNTER — Ambulatory Visit: Payer: Medicaid Other | Admitting: Endocrinology

## 2019-06-18 ENCOUNTER — Other Ambulatory Visit: Payer: Self-pay

## 2019-06-20 ENCOUNTER — Ambulatory Visit (INDEPENDENT_AMBULATORY_CARE_PROVIDER_SITE_OTHER): Payer: Medicaid Other | Admitting: Endocrinology

## 2019-06-20 ENCOUNTER — Encounter: Payer: Self-pay | Admitting: Endocrinology

## 2019-06-20 VITALS — BP 118/68 | HR 92 | Ht 64.0 in | Wt 391.0 lb

## 2019-06-20 DIAGNOSIS — Z794 Long term (current) use of insulin: Secondary | ICD-10-CM | POA: Diagnosis not present

## 2019-06-20 DIAGNOSIS — E119 Type 2 diabetes mellitus without complications: Secondary | ICD-10-CM | POA: Diagnosis not present

## 2019-06-20 MED ORDER — OZEMPIC (0.25 OR 0.5 MG/DOSE) 2 MG/1.5ML ~~LOC~~ SOPN: 0.2500 mg | PEN_INJECTOR | SUBCUTANEOUS | 11 refills | Status: DC

## 2019-06-20 MED ORDER — NOVOLOG FLEXPEN 100 UNIT/ML ~~LOC~~ SOPN: PEN_INJECTOR | SUBCUTANEOUS | 11 refills | Status: AC

## 2019-06-20 NOTE — Progress Notes (Signed)
Subjective:    Patient ID: Courtney Zamora, female    DOB: 29-Oct-1985, 33 y.o.   MRN: [id number]  HPI Pt returns for f/u of diabetes mellitus: DM type: Insulin-requiring type 2.   Dx'ed: 2019 Complications: none.   Therapy: insulin since dx, and Ozempic. GDM: never DKA: never Severe hypoglycemia: never Pancreatitis: never Pancreatic imaging: never. Other: she takes multiple daily injections; she takes chronic steroids for asthma; she eats meals at 7A, 12P, and 6P.   Interval history: pt says she seldom miss any of the 4 insulin doses per day.  In particular, she takes Novolog 50 units 3 times a day (just before each meal).  I reviewed continuous glucose monitor data.  Glucose varies from 65-260.  It is in general higher as the day goes on.    Past Medical History:  Diagnosis Date  . Anxiety   . Asthma   . Chronic headache    "1-2/week" (04/09/2018)  . Chronic lower back pain   . Depression   . Essential hypertension   . Family history of adverse reaction to anesthesia    "mom would get PONV" (04/09/2018)  . GERD (gastroesophageal reflux disease)   . Manic state (HCC)   . Migraine    "a few/year" (04/09/2018)  . Obesity   . OSA on CPAP   . Osteoarthritis, knee   . Pneumonia    "this is my 2nd time" (04/09/2018)  . Prolonged QT syndrome   . PTSD (post-traumatic stress disorder)   . Rheumatoid arthritis (HCC)   . Seizures (HCC)    "stress induced; 1 in this past year" (04/09/2018)  . Severe needle phobia   . Type II diabetes mellitus (HCC)     Past Surgical History:  Procedure Laterality Date  . TONSILLECTOMY  1994    Social History   Socioeconomic History  . Marital status: Married    Spouse name: Not on file  . Number of children: Not on file  . Years of education: Not on file  . Highest education level: Not on file  Occupational History  . Not on file  Social Needs  . Financial resource strain: Not on file  . Food insecurity    Worry: Not on file   Inability: Not on file  . Transportation needs    Medical: Not on file    Non-medical: Not on file  Tobacco Use  . Smoking status: Never Smoker  . Smokeless tobacco: Never Used  Substance and Sexual Activity  . Alcohol use: Not Currently  . Drug use: Never  . Sexual activity: Not Currently    Birth control/protection: None  Lifestyle  . Physical activity    Days per week: Not on file    Minutes per session: Not on file  . Stress: Not on file  Relationships  . Social Musicianconnections    Talks on phone: Not on file    Gets together: Not on file    Attends religious service: Not on file    Active member of club or organization: Not on file    Attends meetings of clubs or organizations: Not on file    Relationship status: Not on file  . Intimate partner violence    Fear of current or ex partner: Not on file    Emotionally abused: Not on file    Physically abused: Not on file    Forced sexual activity: Not on file  Other Topics Concern  . Not on file  Social History  Narrative  . Not on file    Current Outpatient Medications on File Prior to Visit  Medication Sig Dispense Refill  . albuterol (PROVENTIL HFA;VENTOLIN HFA) 108 (90 Base) MCG/ACT inhaler Inhale 2 puffs into the lungs every 6 (six) hours as needed for wheezing or shortness of breath (wheezing). For shortness of breath. 1 Inhaler 11  . albuterol (PROVENTIL) (2.5 MG/3ML) 0.083% nebulizer solution Take 3 mLs (2.5 mg total) by nebulization every 6 (six) hours as needed for wheezing or shortness of breath. 150 mL 1  . atenolol (TENORMIN) 25 MG tablet Take 0.5 tablets (12.5 mg total) by mouth daily. 30 tablet 3  . BELBUCA 900 MCG FILM DISSOLVE 1 FILM INSIDE OF CHEEKS Q 12 HOURS  0  . Biotin w/ Vitamins C & E (HAIR/SKIN/NAILS PO) Take 1 tablet by mouth daily.    . Calcium Citrate 200 MG TABS Take 2 tablets (400 mg total) by mouth every other day. (Patient taking differently: Take 200 mg by mouth daily. )    . cetirizine (ZYRTEC)  10 MG tablet TAKE 1 TABLET BY MOUTH EVERY NIGHT AT BEDTIME (Patient taking differently: Take 10 mg by mouth at bedtime. ) 30 tablet 0  . Continuous Blood Gluc Sensor (DEXCOM G6 SENSOR) MISC 1 Device by Does not apply route daily as needed. Change every 10 days 10 each 3  . Crisaborole (EUCRISA) 2 % OINT Apply topically.    . DULoxetine (CYMBALTA) 30 MG capsule TAKE 1 CAPSULE BY MOUTH TWICE DAILY (Patient taking differently: Take 30 mg by mouth 2 (two) times daily. ) 60 capsule 2  . famotidine (PEPCID) 20 MG tablet TK 1 T PO BID  0  . furosemide (LASIX) 20 MG tablet TAKE 1 TABLET BY MOUTH EVERY OTHER DAY (Patient taking differently: Take 20 mg by mouth every other day. ) 30 tablet 3  . gabapentin (NEURONTIN) 300 MG capsule TAKE 1 CAPSULE BY MOUTH EVERY MORNING, 1 CAPSULE BY MOUTH AT NOON, AND 2 CAPSULES BY MOUTH AT BEDTIME (Patient taking differently: Take 300-600 mg by mouth See admin instructions. TAKE 1 CAPSULE BY MOUTH EVERY MORNING, 1 CAPSULE BY MOUTH AT NOON, AND 2 CAPSULES BY MOUTH AT BEDTIME) 120 capsule 5  . glucose blood (ACCU-CHEK AVIVA PLUS) test strip UAD UP TO TID TO CHECK BLOOD SUGAR 100 each 12  . hydrochlorothiazide (HYDRODIURIL) 25 MG tablet Take 1 tablet (25 mg total) by mouth daily. 90 tablet 3  . hydrOXYzine (ATARAX/VISTARIL) 10 MG tablet TAKE 1 TABLET(10 MG) BY MOUTH THREE TIMES DAILY AS NEEDED 30 tablet 2  . Insulin Glargine, 2 Unit Dial, (TOUJEO MAX SOLOSTAR) 300 UNIT/ML SOPN Inject 300 Units into the skin every morning.    . Insulin Pen Needle (B-D ULTRAFINE III SHORT PEN) 31G X 8 MM MISC Use to inject insulin once daily 50 each 2  . ipratropium-albuterol (DUONEB) 0.5-2.5 (3) MG/3ML SOLN Take 3 mLs by nebulization every 6 (six) hours as needed. (Patient taking differently: Take 3 mLs by nebulization every 4 (four) hours as needed (for shortness of breath or wheezing). ) 360 mL 5  . montelukast (SINGULAIR) 10 MG tablet TAKE 1 TABLET(10 MG) BY MOUTH DAILY 30 tablet 2  . Multiple  Vitamins-Minerals (WOMENS MULTI) CAPS Take 1 capsule by mouth daily.    . mycophenolate (CELLCEPT) 500 MG tablet Take 500 mg by mouth 2 (two) times daily.  2  . nabumetone (RELAFEN) 500 MG tablet Take 1 tablet (500 mg total) by mouth 2 (two) times daily as  needed. 60 tablet 3  . Omega-3 Fatty Acids (FISH OIL) 1000 MG CAPS Take 1 capsule by mouth daily.    Marland Kitchen. oxyCODONE-acetaminophen (PERCOCET/ROXICET) 5-325 MG tablet TK 1 OR 2 TS PO Q  6 H PRN P  0  . pantoprazole (PROTONIX) 40 MG tablet TAKE 1 TABLET(40 MG) BY MOUTH TWICE DAILY BEFORE A MEAL 60 tablet 0  . phentermine 15 MG capsule Take 15 mg by mouth every morning.    . promethazine (PHENERGAN) 25 MG tablet Take 1 tablet (25 mg total) by mouth every 8 (eight) hours as needed for nausea or vomiting. 30 tablet 2  . Respiratory Therapy Supplies (FLUTTER) DEVI As directed. 1 each 0  . Spacer/Aero-Holding Chambers (AEROCHAMBER MV) inhaler Use as instructed 1 each 0  . tiZANidine (ZANAFLEX) 4 MG tablet TAKE 1 TABLET(4 MG) BY MOUTH EVERY 6 HOURS AS NEEDED FOR MUSCLE SPASMS 60 tablet 0  . Vitamin D, Ergocalciferol, (DRISDOL) 50000 units CAPS capsule Take 50,000 Units by mouth once a week.  3  . VYVANSE 50 MG capsule Take 50 mg by mouth daily.  0   No current facility-administered medications on file prior to visit.     Allergies  Allergen Reactions  . Bee Venom Anaphylaxis  . Cinnamon Anaphylaxis  . Doxycycline Hives  . Ibuprofen Other (See Comments)    Abdominal pain  . Lexapro [Escitalopram Oxalate] Other (See Comments)    Generalized body shaking. ? sz  . Latex Rash  . Tape Hives, Rash and Other (See Comments)    EKG STICKERS    Family History  Problem Relation Age of Onset  . Hypothyroidism Mother   . Depression Mother   . Anxiety disorder Mother   . Diabetes Father   . Diabetes Paternal Uncle   . Hypertension Maternal Grandmother   . Hypertension Maternal Grandfather   . Pancreatic cancer Maternal Grandfather   . Hypertension  Paternal Grandmother   . Heart disease Paternal Grandfather   . Hypertension Paternal Grandfather     BP 118/68 (BP Location: Left Wrist, Patient Position: Sitting, Cuff Size: Large)   Pulse 92   Ht 5\' 4"  (1.626 m)   Wt (!) 391 lb (177.4 kg)   SpO2 97%   BMI 67.11 kg/m    Review of Systems She denies hypoglycemia    Objective:   Physical Exam VITAL SIGNS:  See vs page GENERAL: no distress.  In wheelchair Pulses: dorsalis pedis intact bilat.   MSK: no deformity of the feet CV: 1+ bilat leg edema Skin:  no ulcer on the feet.  normal color and temp on the feet. Neuro: sensation is intact to touch on the feet.   Ext: there is bilateral onychomycosis of the toenails  outside test results are reviewed: A1c=8.8%      Assessment & Plan:  Insulin-requiring type 2 DM: she needs increased rx Hypoglycemia: this limits aggressiveness of glycemic control   Patient Instructions  check your blood sugar 5 times a day: before the 3 meals, and at bedtime.  also check if you have symptoms of your blood sugar being too high or too low.  please keep a record of the readings and bring it to your next appointment here (or you can bring the meter itself).  You can write it on any piece of paper.  please call us sooner if your blood sugar goes below 70, or if you have a lot of readings over 200.    Please take the Toujeo, 300 units  daily, and:    Increase the Novolog to 3 times a day (just before each meal) 50-70-70 units.   Please come back for a follow-up appointment in 2 months.

## 2019-06-20 NOTE — Patient Instructions (Addendum)
check your blood sugar 5 times a day: before the 3 meals, and at bedtime.  also check if you have symptoms of your blood sugar being too high or too low.  please keep a record of the readings and bring it to your next appointment here (or you can bring the meter itself).  You can write it on any piece of paper.  please call us sooner if your blood sugar goes below 70, or if you have a lot of readings over 200.    Please take the Toujeo, 300 units daily, and:    Increase the Novolog to 3 times a day (just before each meal) 50-70-70 units.   Please come back for a follow-up appointment in 2 months.

## 2019-08-27 ENCOUNTER — Ambulatory Visit: Payer: Medicaid Other | Admitting: Endocrinology

## 2019-08-28 ENCOUNTER — Telehealth: Payer: Self-pay

## 2019-08-28 NOTE — Telephone Encounter (Signed)
Company: [employer]  Document: Medical records request from [facility] 01/13/19 INCLUDING CMN (unable to locate)  Forwarded above request to HIM. Document and fax confirmation have been placed in the faxed file for future reference.

## 2019-09-03 ENCOUNTER — Telehealth: Payer: Self-pay | Admitting: Nutrition

## 2019-09-03 NOTE — Telephone Encounter (Signed)
LMN for [employer] insulin pump faxed with chart notes and lab work to [employer] on 03/17/19

## 2019-09-12 ENCOUNTER — Encounter: Payer: Self-pay | Admitting: Endocrinology

## 2019-09-13 NOTE — Telephone Encounter (Signed)
Patient called re: Patient has been requesting RX for Nano pen needles (small) but was told patient needs to schedule an appointment. Patient states she is unable to schedule an appointment due to not having any Medicaid visits left. If Dr. Everardo All requires patient to make an appointment, Dr. Everardo All would need get Prior Approval from [facility] stating the reason why patient needs to be seen. Patient requests to be called at ph# [phone] to discuss options. Patient requests RX for Nano pen needles (small) be sent to:  [id number] [place], [place] - [phone] AT [facility] [address] Phone:  [phone]  Fax:  [phone]

## 2019-09-13 NOTE — Telephone Encounter (Signed)
Please refer to response below. Also, will change order for pen needles to one covered by Medicaid.

## 2019-09-16 NOTE — Telephone Encounter (Signed)
Please review below. More specifically, please review following comment and advise:  Dr. Everardo All would need get Prior Approval from [facility] stating the reason why patient needs to be seen.

## 2019-09-19 ENCOUNTER — Other Ambulatory Visit: Payer: Self-pay

## 2019-09-19 DIAGNOSIS — E119 Type 2 diabetes mellitus without complications: Secondary | ICD-10-CM

## 2019-09-19 MED ORDER — BD PEN NEEDLE SHORT U/F 31G X 8 MM MISC: 1.0000 | Freq: Four times a day (QID) | 0 refills | Status: DC

## 2019-09-19 NOTE — Telephone Encounter (Signed)
Prior auth for visits to this office come from their referring MD. Pt is aware of such had a good conversation with the pt explaining this to her. She has an appt set for April 21-this allows her time to fix the medicaid issue I found for her and get the auth she needs to come here  Can we please call in [facility] as stated below?

## 2019-09-19 NOTE — Telephone Encounter (Signed)
Outpatient Medication Detail   Disp Refills Start End   Insulin Pen Needle (B-D ULTRAFINE III SHORT PEN) 31G X 8 MM MISC 120 each 0 09/19/2019    Sig - Route: 1 each by Other route in the morning, at noon, in the evening, and at bedtime. E11.9 - Other   Sent to pharmacy as: Insulin Pen Needle (B-D ULTRAFINE III SHORT PEN) 31G X 8 MM Misc   E-Prescribing Status: Receipt confirmed by pharmacy (09/19/2019 11:09 AM EST)

## 2019-09-23 ENCOUNTER — Telehealth: Payer: Self-pay | Admitting: Endocrinology

## 2019-09-23 ENCOUNTER — Other Ambulatory Visit: Payer: Self-pay

## 2019-09-23 MED ORDER — BD PEN NEEDLE NANO 2ND GEN 32G X 4 MM MISC: 1.0000 | Freq: Four times a day (QID) | 0 refills | Status: AC

## 2019-09-23 NOTE — Telephone Encounter (Signed)
Patient called stating Dr Everardo All was going to send in RX for the insulin pen nano, but he sent the short pen instead. Patient requests the nano to be sent to   [id number] [place], [place] - [address] AT [facility] OF [address] Phone:  [phone]  Fax:  [phone]

## 2019-09-23 NOTE — Telephone Encounter (Signed)
Outpatient Medication Detail   Disp Refills Start End   Insulin Pen Needle (BD PEN NEEDLE NANO 2ND GEN) 32G X 4 MM MISC 120 each 0 09/23/2019    Sig - Route: 1 each by Does not apply route in the morning, at noon, in the evening, and at bedtime. E11.9 - Does not apply   Sent to pharmacy as: Insulin Pen Needle (BD PEN NEEDLE NANO 2ND GEN) 32G X 4 MM Misc   E-Prescribing Status: Receipt confirmed by pharmacy (09/23/2019 12:45 PM EST)

## 2019-10-01 ENCOUNTER — Telehealth: Payer: Self-pay

## 2019-10-01 NOTE — Telephone Encounter (Signed)
FAXED DOCUMENTS  Company: [employer]  Document: Request office notes Other records requested: ONLY requested office notes. Review of pt chart indicates ONLY RECENT office notes are from [facility] 07/17/19  All above requested information has been faxed successfully to the Company listed above. Documents and fax confirmation have been placed in the faxed file for future reference.

## 2019-10-21 ENCOUNTER — Ambulatory Visit: Payer: Medicaid Other | Admitting: Endocrinology

## 2019-10-23 ENCOUNTER — Other Ambulatory Visit: Payer: Self-pay

## 2019-10-25 ENCOUNTER — Other Ambulatory Visit: Payer: Self-pay

## 2019-10-25 ENCOUNTER — Encounter: Payer: Self-pay | Admitting: Endocrinology

## 2019-10-25 ENCOUNTER — Ambulatory Visit (INDEPENDENT_AMBULATORY_CARE_PROVIDER_SITE_OTHER): Payer: Medicaid Other | Admitting: Endocrinology

## 2019-10-25 VITALS — BP 120/78 | HR 88 | Ht 64.0 in | Wt >= 6400 oz

## 2019-10-25 DIAGNOSIS — E119 Type 2 diabetes mellitus without complications: Secondary | ICD-10-CM | POA: Diagnosis not present

## 2019-10-25 DIAGNOSIS — Z794 Long term (current) use of insulin: Secondary | ICD-10-CM

## 2019-10-25 LAB — POCT GLYCOSYLATED HEMOGLOBIN (HGB A1C): Hemoglobin A1C: 9.7 % — AB (ref 4.0–5.6)

## 2019-10-25 MED ORDER — TOUJEO MAX SOLOSTAR 300 UNIT/ML ~~LOC~~ SOPN: 350.0000 [IU] | PEN_INJECTOR | SUBCUTANEOUS | Status: AC

## 2019-10-25 NOTE — Progress Notes (Signed)
Subjective:    Patient ID: Courtney Zamora, female    DOB: 11-12-1985, 34 y.o.   MRN: [id number]  HPI Pt returns for f/u of diabetes mellitus: DM type: Insulin-requiring type 2.   Dx'ed: 2019 Complications: none.   Therapy: insulin since dx, and Ozempic.  GDM: never DKA: never Severe hypoglycemia: never Pancreatitis: never Pancreatic imaging: never. Other: she takes multiple daily injections; she takes chronic steroids for asthma; she eats meals at 8-9A, 12P, and 6P.   Interval history: pt says she did not take any insulin 1/21-3/21, due to n/v.  sxs are resolved now.  I reviewed continuous glucose monitor data.  Glucose varies from 130-400.  There is no trend throughout the day.   Past Medical History:  Diagnosis Date   Anxiety    Asthma    Chronic headache    "1-2/week" (04/09/2018)   Chronic lower back pain    Depression    Essential hypertension    Family history of adverse reaction to anesthesia    "mom would get PONV" (04/09/2018)   GERD (gastroesophageal reflux disease)    Manic state (HCC)    Migraine    "a few/year" (04/09/2018)   Obesity    OSA on CPAP    Osteoarthritis, knee    Pneumonia    "this is my 2nd time" (04/09/2018)   Prolonged QT syndrome    PTSD (post-traumatic stress disorder)    Rheumatoid arthritis (HCC)    Seizures (HCC)    "stress induced; 1 in this past year" (04/09/2018)   Severe needle phobia    Type II diabetes mellitus (HCC)     Past Surgical History:  Procedure Laterality Date   TONSILLECTOMY  1994    Social History   Socioeconomic History   Marital status: Married    Spouse name: Not on file   Number of children: Not on file   Years of education: Not on file   Highest education level: Not on file  Occupational History   Not on file  Tobacco Use   Smoking status: Never Smoker   Smokeless tobacco: Never Used  Substance and Sexual Activity   Alcohol use: Not Currently   Drug use: Never    Sexual activity: Not Currently    Birth control/protection: None  Other Topics Concern   Not on file  Social History Narrative   Not on file   Social Determinants of Health   Financial Resource Strain:    Difficulty of Paying Living Expenses:   Food Insecurity:    Worried About Programme researcher, broadcasting/film/video in the Last Year:    Barista in the Last Year:   Transportation Needs:    Freight forwarder (Medical):    Lack of Transportation (Non-Medical):   Physical Activity:    Days of Exercise per Week:    Minutes of Exercise per Session:   Stress:    Feeling of Stress :   Social Connections:    Frequency of Communication with Friends and Family:    Frequency of Social Gatherings with Friends and Family:    Attends Religious Services:    Active Member of Clubs or Organizations:    Attends Banker Meetings:    Marital Status:   Intimate Partner Violence:    Fear of Current or Ex-Partner:    Emotionally Abused:    Physically Abused:    Sexually Abused:     Current Outpatient Medications on File Prior to Visit  Medication  Sig Dispense Refill   albuterol (PROVENTIL HFA;VENTOLIN HFA) 108 (90 Base) MCG/ACT inhaler Inhale 2 puffs into the lungs every 6 (six) hours as needed for wheezing or shortness of breath (wheezing). For shortness of breath. 1 Inhaler 11   albuterol (PROVENTIL) (2.5 MG/3ML) 0.083% nebulizer solution Take 3 mLs (2.5 mg total) by nebulization every 6 (six) hours as needed for wheezing or shortness of breath. 150 mL 1   atenolol (TENORMIN) 25 MG tablet Take 0.5 tablets (12.5 mg total) by mouth daily. 30 tablet 3   BELBUCA 900 MCG FILM DISSOLVE 1 FILM INSIDE OF CHEEKS Q 12 HOURS  0   Biotin w/ Vitamins C & E (HAIR/SKIN/NAILS PO) Take 1 tablet by mouth daily.     Calcium Citrate 200 MG TABS Take 2 tablets (400 mg total) by mouth every other day. (Patient taking differently: Take 200 mg by mouth daily. )     cetirizine (ZYRTEC)  10 MG tablet TAKE 1 TABLET BY MOUTH EVERY NIGHT AT BEDTIME (Patient taking differently: Take 10 mg by mouth at bedtime. ) 30 tablet 0   Continuous Blood Gluc Sensor (DEXCOM G6 SENSOR) MISC 1 Device by Does not apply route daily as needed. Change every 10 days 10 each 3   Crisaborole (EUCRISA) 2 % OINT Apply topically.     DULoxetine (CYMBALTA) 30 MG capsule TAKE 1 CAPSULE BY MOUTH TWICE DAILY (Patient taking differently: Take 30 mg by mouth 2 (two) times daily. ) 60 capsule 2   famotidine (PEPCID) 20 MG tablet TK 1 T PO BID  0   furosemide (LASIX) 20 MG tablet TAKE 1 TABLET BY MOUTH EVERY OTHER DAY (Patient taking differently: Take 20 mg by mouth every other day. ) 30 tablet 3   gabapentin (NEURONTIN) 300 MG capsule TAKE 1 CAPSULE BY MOUTH EVERY MORNING, 1 CAPSULE BY MOUTH AT NOON, AND 2 CAPSULES BY MOUTH AT BEDTIME (Patient taking differently: Take 300-600 mg by mouth See admin instructions. TAKE 1 CAPSULE BY MOUTH EVERY MORNING, 1 CAPSULE BY MOUTH AT NOON, AND 2 CAPSULES BY MOUTH AT BEDTIME) 120 capsule 5   glucose blood (ACCU-CHEK AVIVA PLUS) test strip UAD UP TO TID TO CHECK BLOOD SUGAR 100 each 12   hydrochlorothiazide (HYDRODIURIL) 25 MG tablet Take 1 tablet (25 mg total) by mouth daily. 90 tablet 3   hydrOXYzine (ATARAX/VISTARIL) 10 MG tablet TAKE 1 TABLET(10 MG) BY MOUTH THREE TIMES DAILY AS NEEDED 30 tablet 2   insulin aspart (NOVOLOG FLEXPEN) 100 UNIT/ML FlexPen 3 times a day (just before each meal) 50-70-70 units, and pen needles 5/day 20 pen 11   Insulin Pen Needle (BD PEN NEEDLE NANO 2ND GEN) 32G X 4 MM MISC 1 each by Does not apply route in the morning, at noon, in the evening, and at bedtime. E11.9 120 each 0   ipratropium-albuterol (DUONEB) 0.5-2.5 (3) MG/3ML SOLN Take 3 mLs by nebulization every 6 (six) hours as needed. (Patient taking differently: Take 3 mLs by nebulization every 4 (four) hours as needed (for shortness of breath or wheezing). ) 360 mL 5   montelukast  (SINGULAIR) 10 MG tablet TAKE 1 TABLET(10 MG) BY MOUTH DAILY 30 tablet 2   Multiple Vitamins-Minerals (WOMENS MULTI) CAPS Take 1 capsule by mouth daily.     mycophenolate (CELLCEPT) 500 MG tablet Take 500 mg by mouth 2 (two) times daily.  2   nabumetone (RELAFEN) 500 MG tablet Take 1 tablet (500 mg total) by mouth 2 (two) times daily as needed. 60  tablet 3   Omega-3 Fatty Acids (FISH OIL) 1000 MG CAPS Take 1 capsule by mouth daily.     oxyCODONE-acetaminophen (PERCOCET/ROXICET) 5-325 MG tablet TK 1 OR 2 TS PO Q  6 H PRN P  0   pantoprazole (PROTONIX) 40 MG tablet TAKE 1 TABLET(40 MG) BY MOUTH TWICE DAILY BEFORE A MEAL 60 tablet 0   phentermine 15 MG capsule Take 15 mg by mouth every morning.     promethazine (PHENERGAN) 25 MG tablet Take 1 tablet (25 mg total) by mouth every 8 (eight) hours as needed for nausea or vomiting. 30 tablet 2   Respiratory Therapy Supplies (FLUTTER) DEVI As directed. 1 each 0   Semaglutide (OZEMPIC, 1 MG/DOSE, [place]) Inject 1 mg into the skin once a week.     Spacer/Aero-Holding Chambers (AEROCHAMBER MV) inhaler Use as instructed 1 each 0   tiZANidine (ZANAFLEX) 4 MG tablet TAKE 1 TABLET(4 MG) BY MOUTH EVERY 6 HOURS AS NEEDED FOR MUSCLE SPASMS 60 tablet 0   Vitamin D, Ergocalciferol, (DRISDOL) 50000 units CAPS capsule Take 50,000 Units by mouth once a week.  3   VYVANSE 50 MG capsule Take 50 mg by mouth daily.  0   No current facility-administered medications on file prior to visit.    Allergies  Allergen Reactions   Bee Venom Anaphylaxis   Cinnamon Anaphylaxis   Doxycycline Hives   Ibuprofen Other (See Comments)    Abdominal pain   Lexapro [Escitalopram Oxalate] Other (See Comments)    Generalized body shaking. ? sz   Latex Rash   Tape Hives, Rash and Other (See Comments)    EKG STICKERS    Family History  Problem Relation Age of Onset   Hypothyroidism Mother    Depression Mother    Anxiety disorder Mother    Diabetes Father     Diabetes Paternal Uncle    Hypertension Maternal Grandmother    Hypertension Maternal Grandfather    Pancreatic cancer Maternal Grandfather    Hypertension Paternal Grandmother    Heart disease Paternal Grandfather    Hypertension Paternal Grandfather     BP 120/78    Pulse 88    Ht 5\' 4"  (1.626 m)    Wt (!) 407 lb (184.6 kg)    SpO2 96%    BMI 69.86 kg/m    Review of Systems She denies hypoglycemia.      Objective:   Physical Exam VITAL SIGNS:  See vs page GENERAL: no distress.  In wheelchair.  Has 02 on.   Pulses: dorsalis pedis intact bilat.   MSK: no deformity of the feet CV: 1+ bilat leg edema Skin:  no ulcer on the feet.  normal color and temp on the feet. Neuro: sensation is intact to touch on the feet.   Lab Results  Component Value Date   HGBA1C 9.7 (A) 10/25/2019   Lab Results  Component Value Date   CREATININE 0.81 01/23/2019   BUN 16 01/23/2019   NA 137 01/23/2019   K 3.1 (L) 01/23/2019   CL 90 (L) 01/23/2019   CO2 33 (H) 01/23/2019       Assessment & Plan:  Insulin-requiring type 2 DM: poor glycemic control.  N/v, new: we discussed Ozempic in this setting: as sxs are resolved, she declines to d/c  Patient Instructions  check your blood sugar 5 times a day: before the 3 meals, and at bedtime.  also check if you have symptoms of your blood sugar being too high or too  low.  please keep a record of the readings and bring it to your next appointment here (or you can bring the meter itself).  You can write it on any piece of paper.  please call us sooner if your blood sugar goes below 70, or if you have a lot of readings over 200.    Please increase the Toujeo to 350 units daily, and:  Please continue the same Novolog. Please come back for a follow-up appointment in 1 month.

## 2019-10-25 NOTE — Patient Instructions (Addendum)
check your blood sugar 5 times a day: before the 3 meals, and at bedtime.  also check if you have symptoms of your blood sugar being too high or too low.  please keep a record of the readings and bring it to your next appointment here (or you can bring the meter itself).  You can write it on any piece of paper.  please call us sooner if your blood sugar goes below 70, or if you have a lot of readings over 200.    Please increase the Toujeo to 350 units daily, and:  Please continue the same Novolog. Please come back for a follow-up appointment in 1 month.

## 2019-11-28 ENCOUNTER — Ambulatory Visit: Payer: Medicaid Other | Admitting: Endocrinology

## 2019-12-05 ENCOUNTER — Ambulatory Visit: Payer: Medicaid Other | Admitting: Endocrinology

## 2019-12-18 ENCOUNTER — Other Ambulatory Visit: Payer: Self-pay | Admitting: Endocrinology

## 2019-12-27 ENCOUNTER — Emergency Department (HOSPITAL_COMMUNITY): Payer: Medicaid Other

## 2019-12-27 ENCOUNTER — Inpatient Hospital Stay (HOSPITAL_COMMUNITY): Payer: Medicaid Other

## 2019-12-27 ENCOUNTER — Encounter (HOSPITAL_COMMUNITY): Payer: Medicaid Other

## 2019-12-27 ENCOUNTER — Inpatient Hospital Stay (HOSPITAL_COMMUNITY)
Admission: AD | Admit: 2019-12-27 | Discharge: 2020-01-16 | DRG: 870 | Disposition: E | Payer: Medicaid Other | Attending: Critical Care Medicine | Admitting: Critical Care Medicine

## 2019-12-27 ENCOUNTER — Encounter (HOSPITAL_COMMUNITY): Payer: Self-pay

## 2019-12-27 DIAGNOSIS — Q441 Other congenital malformations of gallbladder: Secondary | ICD-10-CM

## 2019-12-27 DIAGNOSIS — K567 Ileus, unspecified: Secondary | ICD-10-CM | POA: Diagnosis not present

## 2019-12-27 DIAGNOSIS — R0602 Shortness of breath: Secondary | ICD-10-CM

## 2019-12-27 DIAGNOSIS — A419 Sepsis, unspecified organism: Secondary | ICD-10-CM | POA: Diagnosis not present

## 2019-12-27 DIAGNOSIS — Z0189 Encounter for other specified special examinations: Secondary | ICD-10-CM

## 2019-12-27 DIAGNOSIS — R4182 Altered mental status, unspecified: Secondary | ICD-10-CM | POA: Diagnosis present

## 2019-12-27 DIAGNOSIS — M79606 Pain in leg, unspecified: Secondary | ICD-10-CM | POA: Diagnosis not present

## 2019-12-27 DIAGNOSIS — F4310 Post-traumatic stress disorder, unspecified: Secondary | ICD-10-CM | POA: Diagnosis present

## 2019-12-27 DIAGNOSIS — M545 Low back pain: Secondary | ICD-10-CM | POA: Diagnosis present

## 2019-12-27 DIAGNOSIS — K7581 Nonalcoholic steatohepatitis (NASH): Secondary | ICD-10-CM | POA: Diagnosis present

## 2019-12-27 DIAGNOSIS — Z20822 Contact with and (suspected) exposure to covid-19: Secondary | ICD-10-CM | POA: Diagnosis present

## 2019-12-27 DIAGNOSIS — E1101 Type 2 diabetes mellitus with hyperosmolarity with coma: Secondary | ICD-10-CM

## 2019-12-27 DIAGNOSIS — Z8249 Family history of ischemic heart disease and other diseases of the circulatory system: Secondary | ICD-10-CM

## 2019-12-27 DIAGNOSIS — Z800 Family history of malignant neoplasm of digestive organs: Secondary | ICD-10-CM

## 2019-12-27 DIAGNOSIS — G9340 Encephalopathy, unspecified: Secondary | ICD-10-CM | POA: Diagnosis not present

## 2019-12-27 DIAGNOSIS — J9601 Acute respiratory failure with hypoxia: Secondary | ICD-10-CM | POA: Diagnosis not present

## 2019-12-27 DIAGNOSIS — G9341 Metabolic encephalopathy: Secondary | ICD-10-CM | POA: Diagnosis present

## 2019-12-27 DIAGNOSIS — K7200 Acute and subacute hepatic failure without coma: Secondary | ICD-10-CM | POA: Diagnosis present

## 2019-12-27 DIAGNOSIS — J45909 Unspecified asthma, uncomplicated: Secondary | ICD-10-CM | POA: Diagnosis present

## 2019-12-27 DIAGNOSIS — Z91040 Latex allergy status: Secondary | ICD-10-CM

## 2019-12-27 DIAGNOSIS — E1110 Type 2 diabetes mellitus with ketoacidosis without coma: Secondary | ICD-10-CM | POA: Diagnosis present

## 2019-12-27 DIAGNOSIS — E46 Unspecified protein-calorie malnutrition: Secondary | ICD-10-CM | POA: Diagnosis not present

## 2019-12-27 DIAGNOSIS — Z794 Long term (current) use of insulin: Secondary | ICD-10-CM

## 2019-12-27 DIAGNOSIS — Z66 Do not resuscitate: Secondary | ICD-10-CM | POA: Diagnosis not present

## 2019-12-27 DIAGNOSIS — I129 Hypertensive chronic kidney disease with stage 1 through stage 4 chronic kidney disease, or unspecified chronic kidney disease: Secondary | ICD-10-CM | POA: Diagnosis present

## 2019-12-27 DIAGNOSIS — Z515 Encounter for palliative care: Secondary | ICD-10-CM | POA: Diagnosis not present

## 2019-12-27 DIAGNOSIS — R9431 Abnormal electrocardiogram [ECG] [EKG]: Secondary | ICD-10-CM | POA: Diagnosis not present

## 2019-12-27 DIAGNOSIS — G40201 Localization-related (focal) (partial) symptomatic epilepsy and epileptic syndromes with complex partial seizures, not intractable, with status epilepticus: Secondary | ICD-10-CM | POA: Diagnosis present

## 2019-12-27 DIAGNOSIS — Z8701 Personal history of pneumonia (recurrent): Secondary | ICD-10-CM

## 2019-12-27 DIAGNOSIS — K219 Gastro-esophageal reflux disease without esophagitis: Secondary | ICD-10-CM | POA: Diagnosis present

## 2019-12-27 DIAGNOSIS — R6521 Severe sepsis with septic shock: Secondary | ICD-10-CM | POA: Diagnosis present

## 2019-12-27 DIAGNOSIS — N189 Chronic kidney disease, unspecified: Secondary | ICD-10-CM | POA: Diagnosis present

## 2019-12-27 DIAGNOSIS — E876 Hypokalemia: Secondary | ICD-10-CM | POA: Diagnosis present

## 2019-12-27 DIAGNOSIS — Z9911 Dependence on respirator [ventilator] status: Secondary | ICD-10-CM | POA: Diagnosis not present

## 2019-12-27 DIAGNOSIS — Z888 Allergy status to other drugs, medicaments and biological substances status: Secondary | ICD-10-CM

## 2019-12-27 DIAGNOSIS — Z6841 Body Mass Index (BMI) 40.0 and over, adult: Secondary | ICD-10-CM

## 2019-12-27 DIAGNOSIS — Z79899 Other long term (current) drug therapy: Secondary | ICD-10-CM

## 2019-12-27 DIAGNOSIS — K5900 Constipation, unspecified: Secondary | ICD-10-CM

## 2019-12-27 DIAGNOSIS — N170 Acute kidney failure with tubular necrosis: Secondary | ICD-10-CM | POA: Diagnosis present

## 2019-12-27 DIAGNOSIS — E8770 Fluid overload, unspecified: Secondary | ICD-10-CM | POA: Diagnosis not present

## 2019-12-27 DIAGNOSIS — R34 Anuria and oliguria: Secondary | ICD-10-CM | POA: Diagnosis not present

## 2019-12-27 DIAGNOSIS — E873 Alkalosis: Secondary | ICD-10-CM | POA: Diagnosis not present

## 2019-12-27 DIAGNOSIS — Z452 Encounter for adjustment and management of vascular access device: Secondary | ICD-10-CM

## 2019-12-27 DIAGNOSIS — E162 Hypoglycemia, unspecified: Secondary | ICD-10-CM | POA: Diagnosis not present

## 2019-12-27 DIAGNOSIS — R809 Proteinuria, unspecified: Secondary | ICD-10-CM | POA: Diagnosis present

## 2019-12-27 DIAGNOSIS — F419 Anxiety disorder, unspecified: Secondary | ICD-10-CM | POA: Diagnosis present

## 2019-12-27 DIAGNOSIS — M069 Rheumatoid arthritis, unspecified: Secondary | ICD-10-CM | POA: Diagnosis present

## 2019-12-27 DIAGNOSIS — Z833 Family history of diabetes mellitus: Secondary | ICD-10-CM

## 2019-12-27 DIAGNOSIS — D696 Thrombocytopenia, unspecified: Secondary | ICD-10-CM | POA: Diagnosis not present

## 2019-12-27 DIAGNOSIS — N179 Acute kidney failure, unspecified: Secondary | ICD-10-CM | POA: Diagnosis not present

## 2019-12-27 DIAGNOSIS — F329 Major depressive disorder, single episode, unspecified: Secondary | ICD-10-CM | POA: Diagnosis present

## 2019-12-27 DIAGNOSIS — J9811 Atelectasis: Secondary | ICD-10-CM | POA: Diagnosis not present

## 2019-12-27 DIAGNOSIS — J9690 Respiratory failure, unspecified, unspecified whether with hypoxia or hypercapnia: Secondary | ICD-10-CM

## 2019-12-27 DIAGNOSIS — Z881 Allergy status to other antibiotic agents status: Secondary | ICD-10-CM

## 2019-12-27 DIAGNOSIS — R578 Other shock: Secondary | ICD-10-CM | POA: Diagnosis not present

## 2019-12-27 DIAGNOSIS — K828 Other specified diseases of gallbladder: Secondary | ICD-10-CM | POA: Diagnosis present

## 2019-12-27 DIAGNOSIS — J9600 Acute respiratory failure, unspecified whether with hypoxia or hypercapnia: Secondary | ICD-10-CM

## 2019-12-27 DIAGNOSIS — E1122 Type 2 diabetes mellitus with diabetic chronic kidney disease: Secondary | ICD-10-CM | POA: Diagnosis present

## 2019-12-27 DIAGNOSIS — K859 Acute pancreatitis without necrosis or infection, unspecified: Secondary | ICD-10-CM | POA: Diagnosis present

## 2019-12-27 DIAGNOSIS — Z9289 Personal history of other medical treatment: Secondary | ICD-10-CM

## 2019-12-27 DIAGNOSIS — Z4659 Encounter for fitting and adjustment of other gastrointestinal appliance and device: Secondary | ICD-10-CM

## 2019-12-27 DIAGNOSIS — E875 Hyperkalemia: Secondary | ICD-10-CM | POA: Diagnosis not present

## 2019-12-27 DIAGNOSIS — R579 Shock, unspecified: Secondary | ICD-10-CM | POA: Diagnosis not present

## 2019-12-27 DIAGNOSIS — E781 Pure hyperglyceridemia: Secondary | ICD-10-CM | POA: Diagnosis present

## 2019-12-27 DIAGNOSIS — E872 Acidosis: Secondary | ICD-10-CM | POA: Diagnosis not present

## 2019-12-27 DIAGNOSIS — G4733 Obstructive sleep apnea (adult) (pediatric): Secondary | ICD-10-CM | POA: Diagnosis present

## 2019-12-27 DIAGNOSIS — G8929 Other chronic pain: Secondary | ICD-10-CM | POA: Diagnosis present

## 2019-12-27 LAB — SARS CORONAVIRUS 2 BY RT PCR (HOSPITAL ORDER, PERFORMED IN ~~LOC~~ HOSPITAL LAB): SARS Coronavirus 2: NEGATIVE

## 2019-12-27 LAB — CBC WITH DIFFERENTIAL/PLATELET
Abs Immature Granulocytes: 0.05 10*3/uL (ref 0.00–0.07)
Basophils Absolute: 0.1 10*3/uL (ref 0.0–0.1)
Basophils Relative: 1 %
Eosinophils Absolute: 0 10*3/uL (ref 0.0–0.5)
Eosinophils Relative: 0 %
HCT: 67.2 % — ABNORMAL HIGH (ref 36.0–46.0)
Hemoglobin: 18.9 g/dL — ABNORMAL HIGH (ref 12.0–15.0)
Immature Granulocytes: 1 %
Lymphocytes Relative: 20 %
Lymphs Abs: 2.2 10*3/uL (ref 0.7–4.0)
MCH: 29.3 pg (ref 26.0–34.0)
MCHC: 28.1 g/dL — ABNORMAL LOW (ref 30.0–36.0)
MCV: 104 fL — ABNORMAL HIGH (ref 80.0–100.0)
Monocytes Absolute: 0.6 10*3/uL (ref 0.1–1.0)
Monocytes Relative: 6 %
Neutro Abs: 7.9 10*3/uL — ABNORMAL HIGH (ref 1.7–7.7)
Neutrophils Relative %: 72 %
Platelets: 250 10*3/uL (ref 150–400)
RBC: 6.46 MIL/uL — ABNORMAL HIGH (ref 3.87–5.11)
RDW: 15.3 % (ref 11.5–15.5)
WBC: 10.7 10*3/uL — ABNORMAL HIGH (ref 4.0–10.5)
nRBC: 0 % (ref 0.0–0.2)

## 2019-12-27 LAB — BASIC METABOLIC PANEL
Anion gap: 25 — ABNORMAL HIGH (ref 5–15)
BUN: 21 mg/dL — ABNORMAL HIGH (ref 6–20)
CO2: 34 mmol/L — ABNORMAL HIGH (ref 22–32)
Calcium: 10.2 mg/dL (ref 8.9–10.3)
Chloride: 79 mmol/L — ABNORMAL LOW (ref 98–111)
Creatinine, Ser: 2.13 mg/dL — ABNORMAL HIGH (ref 0.44–1.00)
GFR calc Af Amer: 34 mL/min — ABNORMAL LOW (ref >60)
GFR calc non Af Amer: 29 mL/min — ABNORMAL LOW (ref >60)
Glucose, Bld: 1186 mg/dL (ref 70–99)
Potassium: 2.7 mmol/L — CL (ref 3.5–5.1)
Sodium: 138 mmol/L (ref 135–145)

## 2019-12-27 LAB — I-STAT VENOUS BLOOD GAS, ED
Acid-Base Excess: 15 mmol/L — ABNORMAL HIGH (ref 0.0–2.0)
Bicarbonate: 44.3 mmol/L — ABNORMAL HIGH (ref 20.0–28.0)
Calcium, Ion: 1.01 mmol/L — ABNORMAL LOW (ref 1.15–1.40)
HCT: 64 % — ABNORMAL HIGH (ref 36.0–46.0)
Hemoglobin: 21.8 g/dL (ref 12.0–15.0)
O2 Saturation: 43 %
Potassium: 2.4 mmol/L — CL (ref 3.5–5.1)
Sodium: 139 mmol/L (ref 135–145)
TCO2: 46 mmol/L — ABNORMAL HIGH (ref 22–32)
pCO2, Ven: 65.7 mmHg — ABNORMAL HIGH (ref 44.0–60.0)
pH, Ven: 7.436 — ABNORMAL HIGH (ref 7.250–7.430)
pO2, Ven: 24 mmHg — CL (ref 32.0–45.0)

## 2019-12-27 LAB — HEPATIC FUNCTION PANEL
ALT: 101 U/L — ABNORMAL HIGH (ref 0–44)
AST: 76 U/L — ABNORMAL HIGH (ref 15–41)
Albumin: 4 g/dL (ref 3.5–5.0)
Alkaline Phosphatase: 369 U/L — ABNORMAL HIGH (ref 38–126)
Bilirubin, Direct: 0.9 mg/dL — ABNORMAL HIGH (ref 0.0–0.2)
Indirect Bilirubin: 1 mg/dL — ABNORMAL HIGH (ref 0.3–0.9)
Total Bilirubin: 1.9 mg/dL — ABNORMAL HIGH (ref 0.3–1.2)
Total Protein: 8.3 g/dL — ABNORMAL HIGH (ref 6.5–8.1)

## 2019-12-27 LAB — CBG MONITORING, ED
Glucose-Capillary: >600 mg/dL (ref 70–99)
Glucose-Capillary: >600 mg/dL (ref 70–99)

## 2019-12-27 LAB — I-STAT BETA HCG BLOOD, ED (MC, WL, AP ONLY): I-stat hCG, quantitative: <5 m[IU]/mL (ref <5)

## 2019-12-27 LAB — LACTIC ACID, PLASMA: Lactic Acid, Venous: 10.8 mmol/L (ref 0.5–1.9)

## 2019-12-27 LAB — ACETAMINOPHEN LEVEL: Acetaminophen (Tylenol), Serum: <10 ug/mL — ABNORMAL LOW (ref 10–30)

## 2019-12-27 LAB — MAGNESIUM: Magnesium: 4.3 mg/dL — ABNORMAL HIGH (ref 1.7–2.4)

## 2019-12-27 LAB — SALICYLATE LEVEL: Salicylate Lvl: <7 mg/dL — ABNORMAL LOW (ref 7.0–30.0)

## 2019-12-27 MED ORDER — LEVETIRACETAM IN NACL 1500 MG/100ML IV SOLN
1500.0000 mg | Freq: Once | INTRAVENOUS | Status: AC
  Administered 2019-12-27: 1500 mg via INTRAVENOUS
  Filled 2019-12-27: qty 100

## 2019-12-27 MED ORDER — LACTATED RINGERS IV BOLUS
1000.0000 mL | Freq: Once | INTRAVENOUS | Status: AC
  Administered 2019-12-27: 1000 mL via INTRAVENOUS

## 2019-12-27 MED ORDER — SODIUM CHLORIDE 0.9 % IV SOLN: INTRAVENOUS | Status: DC

## 2019-12-27 MED ORDER — INSULIN REGULAR(HUMAN) IN NACL 100-0.9 UT/100ML-% IV SOLN
INTRAVENOUS | Status: DC
  Administered 2019-12-28: 12 [IU]/h via INTRAVENOUS
  Administered 2019-12-28 (×2): 11.5 [IU]/h via INTRAVENOUS
  Administered 2019-12-28: 6 [IU]/h via INTRAVENOUS
  Administered 2019-12-29: 17 [IU]/h via INTRAVENOUS
  Administered 2019-12-29: 26 [IU]/h via INTRAVENOUS
  Administered 2019-12-29: 30 [IU]/h via INTRAVENOUS
  Administered 2019-12-29: 23 [IU]/h via INTRAVENOUS
  Administered 2019-12-30: 24 [IU]/h via INTRAVENOUS
  Administered 2019-12-30: 10.5 [IU]/h via INTRAVENOUS
  Administered 2019-12-31: 8 [IU]/h via INTRAVENOUS
  Administered 2019-12-31: 7 [IU]/h via INTRAVENOUS
  Administered 2020-01-01: 6.5 [IU]/h via INTRAVENOUS
  Administered 2020-01-01: 4.8 [IU]/h via INTRAVENOUS
  Administered 2020-01-01: 5.5 [IU]/h via INTRAVENOUS
  Filled 2019-12-27 (×16): qty 100

## 2019-12-27 MED ORDER — POTASSIUM CHLORIDE 10 MEQ/100ML IV SOLN
10.0000 meq | INTRAVENOUS | Status: AC
  Administered 2019-12-28 (×2): 10 meq via INTRAVENOUS
  Filled 2019-12-27 (×2): qty 100

## 2019-12-27 MED ORDER — DEXTROSE-NACL 5-0.45 % IV SOLN: INTRAVENOUS | Status: DC

## 2019-12-27 MED ORDER — CALCIUM GLUCONATE-NACL 1-0.675 GM/50ML-% IV SOLN
1.0000 g | Freq: Once | INTRAVENOUS | Status: AC
  Administered 2019-12-28: 1000 mg via INTRAVENOUS
  Filled 2019-12-27: qty 50

## 2019-12-27 MED ORDER — LORAZEPAM 2 MG/ML IJ SOLN: 0.5000 mg | Freq: Once | INTRAMUSCULAR | Status: AC

## 2019-12-27 MED ORDER — POTASSIUM CHLORIDE 10 MEQ/100ML IV SOLN
10.0000 meq | INTRAVENOUS | Status: AC
  Administered 2019-12-27 (×2): 10 meq via INTRAVENOUS
  Filled 2019-12-27 (×2): qty 100

## 2019-12-27 MED ORDER — HEPARIN SODIUM (PORCINE) 5000 UNIT/ML IJ SOLN
5000.0000 [IU] | Freq: Three times a day (TID) | INTRAMUSCULAR | Status: DC
  Administered 2019-12-28 – 2020-01-08 (×32): 5000 [IU] via SUBCUTANEOUS
  Filled 2019-12-27 (×32): qty 1

## 2019-12-27 MED ORDER — LORAZEPAM 2 MG/ML IJ SOLN
INTRAMUSCULAR | Status: AC
  Administered 2019-12-27: 0.5 mg via INTRAVENOUS
  Filled 2019-12-27: qty 1

## 2019-12-27 MED ORDER — DEXTROSE 50 % IV SOLN
0.0000 mL | INTRAVENOUS | Status: DC | PRN
  Administered 2020-01-08: 25 mL via INTRAVENOUS
  Filled 2019-12-27: qty 50

## 2019-12-27 NOTE — ED Notes (Signed)
Patient transported to CT 

## 2019-12-27 NOTE — Procedures (Signed)
Patient Name: Courtney Zamora  MRN: [id number]  Epilepsy Attending: Charlsie Quest  Referring Physician/Provider: Dr Georgiana Spinner Aroor Date: 2020/01/09 Duration: 23.30 mins  Patient history: 34 year old female with a history of morbid obesity, RA, seizures not on any medications, poorly controlled diabetes who presented with left upper extremity abnormal jerking and ams. EEG to evaluate for seizure   Level of alertness:  lethargic  AEDs during EEG study: LEV  Technical aspects: This EEG study was done with scalp electrodes positioned according to the 10-20 International system of electrode placement. Electrical activity was acquired at a sampling rate of 500Hz  and reviewed with a high frequency filter of 70Hz  and a low frequency filter of 1Hz . EEG data were recorded continuously and digitally stored.   Description: EEG showed continuous generalized 3 to 6 Hz theta-delta slowing.  Hyperventilation and photic stimulation were not performed.     Of note, eeg was technically difficult due to significant ekg artifact, movement artifact as well as electrode artifact.  ABNORMALITY -Continuous slow, generalized  IMPRESSION: This technically difficult study is suggestive of severe diffuse encephalopathy, nonspecific etiology. No seizures or epileptiform discharges were seen throughout the recording.   Severiano Utsey 

## 2019-12-27 NOTE — ED Notes (Addendum)
EEG Being applied to pt

## 2019-12-27 NOTE — ED Provider Notes (Signed)
[facility] EMERGENCY DEPARTMENT Provider Note   CSN: [id number] Arrival date & time: 01-17-20  1936     History Chief Complaint  Patient presents with  . Seizures    Courtney Zamora is a 34 y.o. female.  Patient is a 34 year old female with a history of morbid obesity, RA, seizures not on any medications, poorly controlled diabetes who is presenting today with abnormal jerking and not feeling well.  Patient is able to give only a very brief history due to confusion.  However husband arrives and gives more complete details.  Patient's blood sugar has been elevated over the last few days and she has been having polydipsia, polyuria and has had a mild cough.  Burgess Estelle has been reports patient was twitching a bit generally but today after he went to work her mother called reporting she was jerking on her left side.  Patient just continually repeats the same words over and over again stating she cannot get her sugar down.  She denies chest pain or abdominal pain.  There have been some recent changes in her diabetic medications but it is unclear when she last checked her blood sugar and how high it was.  No recent fever or known Covid contacts.  The history is provided by the patient and the spouse. The history is limited by the condition of the patient.  Seizures Seizure activity on arrival: yes   Seizure type:  Focal Preceding symptoms: numbness   Initial focality:  Left-sided Episode characteristics: abnormal movements, confusion and disorientation   Return to baseline: no   Severity:  Moderate Timing: constant since noon or earlier. Number of seizures this episode:  Constant Progression:  Unchanged Context: change in medication   Context: not alcohol withdrawal and medical compliance   Context comment:  Recently started ozempic.  still using lantus.  no seizure meds      Past Medical History:  Diagnosis Date  . Anxiety   . Asthma   . Chronic headache     "1-2/week" (04/09/2018)  . Chronic lower back pain   . Depression   . Essential hypertension   . Family history of adverse reaction to anesthesia    "mom would get PONV" (04/09/2018)  . GERD (gastroesophageal reflux disease)   . Manic state (HCC)   . Migraine    "a few/year" (04/09/2018)  . Obesity   . OSA on CPAP   . Osteoarthritis, knee   . Pneumonia    "this is my 2nd time" (04/09/2018)  . Prolonged QT syndrome   . PTSD (post-traumatic stress disorder)   . Rheumatoid arthritis (HCC)   . Seizures (HCC)    "stress induced; 1 in this past year" (04/09/2018)  . Severe needle phobia   . Type II diabetes mellitus [facility])     Patient Active Problem List   Diagnosis Date Noted  . ILD (interstitial lung disease) (HCC) 03/12/2019  . OSA (obstructive sleep apnea) 03/12/2019  . Vomiting 01/23/2019  . General counseling and advice for contraceptive management 05/17/2018  . Abnormal CT of the chest 04/10/2018  . History of PCOS 04/09/2018  . DUB (dysfunctional uterine bleeding) 03/02/2018  . Abnormal Pap smear of cervix 03/02/2018  . Rheumatoid arthritis involving multiple sites with positive rheumatoid factor (HCC) 02/27/2018  . Abnormal CT scan, esophagus 09/19/2017  . Dysphagia 09/19/2017  . Elevated LFTs 09/19/2017  . Hepatic steatosis 09/15/2017  . DM2 (diabetes mellitus, type 2) (HCC) 09/06/2017  . Metatarsal stress fracture, right, sequela  11/17/2016  . Neck muscle spasm 10/05/2016  . Nipple discharge in female 09/13/2016  . Depression 08/17/2016  . Anxiety 08/17/2016  . ASCUS with positive high risk HPV cervical 08/14/2016  . Hypertension 08/14/2016  . Long term (current) use of systemic steroids 08/14/2016  . Esophageal reflux 12/29/2015  . Morbid (severe) obesity due to excess calories (HCC) 12/29/2015  . Chronic pain of right knee 12/29/2015  . Chronic pain of left ankle 12/29/2015  . 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 Asthma, moderate persistent 12/09/2015    Past  Surgical History:  Procedure Laterality Date  . TONSILLECTOMY  1994     OB History    Gravida  1   Para  0   Term  0   Preterm  0   AB  1   Living  0     SAB  1   TAB  0   Ectopic  0   Multiple  0   Live Births  0           Family History  Problem Relation Age of Onset  . Hypothyroidism Mother   . Depression Mother   . Anxiety disorder Mother   . Diabetes Father   . Diabetes Paternal Uncle   . Hypertension Maternal Grandmother   . Hypertension Maternal Grandfather   . Pancreatic cancer Maternal Grandfather   . Hypertension Paternal Grandmother   . Heart disease Paternal Grandfather   . Hypertension Paternal Grandfather     Social History   Tobacco Use  . Smoking status: Never Smoker  . Smokeless tobacco: Never Used  Vaping Use  . Vaping Use: Never used  Substance Use Topics  . Alcohol use: Not Currently  . Drug use: Never    Home Medications Prior to Admission medications   Medication Sig Start Date End Date Taking? Authorizing Provider  albuterol (PROVENTIL HFA;VENTOLIN HFA) 108 (90 Base) MCG/ACT inhaler Inhale 2 puffs into the lungs every 6 (six) hours as needed for wheezing or shortness of breath (wheezing). For shortness of breath. 01/05/18   Marcine Matar, MD  albuterol (PROVENTIL) (2.5 MG/3ML) 0.083% nebulizer solution Take 3 mLs (2.5 mg total) by nebulization every 6 (six) hours as needed for wheezing or shortness of breath. 03/12/19   Parrett, Virgel Bouquet, NP  atenolol (TENORMIN) 25 MG tablet Take 0.5 tablets (12.5 mg total) by mouth daily. 02/12/18   Marcine Matar, MD  BELBUCA 900 MCG FILM DISSOLVE 1 FILM INSIDE OF CHEEKS Q 12 HOURS 05/30/18   [provider]  Biotin w/ Vitamins C & E (HAIR/SKIN/NAILS PO) Take 1 tablet by mouth daily.    [provider]  Calcium Citrate 200 MG TABS Take 2 tablets (400 mg total) by mouth every other day. Patient taking differently: Take 200 mg by mouth daily.  10/05/16   Newt Lukes, MD  cetirizine (ZYRTEC) 10 MG tablet TAKE 1 TABLET BY MOUTH EVERY NIGHT AT BEDTIME Patient taking differently: Take 10 mg by mouth at bedtime.  03/02/18   Marcine Matar, MD  Continuous Blood Gluc Sensor (DEXCOM G6 SENSOR) MISC 1 Device by Does not apply route daily as needed. Change every 10 days 04/04/19   Romero Belling, MD  Crisaborole (EUCRISA) 2 % OINT Apply topically.    [provider]  DULoxetine (CYMBALTA) 30 MG capsule TAKE 1 CAPSULE BY MOUTH TWICE DAILY Patient taking differently: Take 30 mg by mouth 2 (two) times daily.  03/05/18   Marcine Matar,  MD  famotidine (PEPCID) 20 MG tablet TK 1 T PO BID 05/28/18   [provider]  furosemide (LASIX) 20 MG tablet TAKE 1 TABLET BY MOUTH EVERY OTHER DAY Patient taking differently: Take 20 mg by mouth every other day.  03/30/18   Marcine Matar, MD  gabapentin (NEURONTIN) 300 MG capsule TAKE 1 CAPSULE BY MOUTH EVERY MORNING, 1 CAPSULE BY MOUTH AT NOON, AND 2 CAPSULES BY MOUTH AT BEDTIME Patient taking differently: Take 300-600 mg by mouth See admin instructions. TAKE 1 CAPSULE BY MOUTH EVERY MORNING, 1 CAPSULE BY MOUTH AT NOON, AND 2 CAPSULES BY MOUTH AT BEDTIME 03/05/18   Marcine Matar, MD  glucose blood (ACCU-CHEK AVIVA PLUS) test strip UAD UP TO TID TO CHECK BLOOD SUGAR 03/06/18   Marcine Matar, MD  hydrochlorothiazide (HYDRODIURIL) 25 MG tablet Take 1 tablet (25 mg total) by mouth daily. 10/02/17   Marcine Matar, MD  hydrOXYzine (ATARAX/VISTARIL) 10 MG tablet TAKE 1 TABLET(10 MG) BY MOUTH THREE TIMES DAILY AS NEEDED 10/19/18   Marcine Matar, MD  insulin aspart (NOVOLOG FLEXPEN) 100 UNIT/ML FlexPen 3 times a day (just before each meal) 50-70-70 units, and pen needles 5/day 06/20/19   Romero Belling, MD  insulin glargine, 2 Unit Dial, (TOUJEO MAX SOLOSTAR) 300 UNIT/ML Solostar Pen Inject 350 Units into the skin every morning. 10/25/19   Romero Belling, MD  Insulin Pen Needle (BD PEN NEEDLE NANO 2ND  GEN) 32G X 4 MM MISC 1 each by Does not apply route in the morning, at noon, in the evening, and at bedtime. E11.9 09/23/19   Romero Belling, MD  ipratropium-albuterol (DUONEB) 0.5-2.5 (3) MG/3ML SOLN Take 3 mLs by nebulization every 6 (six) hours as needed. Patient taking differently: Take 3 mLs by nebulization every 4 (four) hours as needed (for shortness of breath or wheezing).  07/28/17   Mannam, Colbert Coyer, MD  montelukast (SINGULAIR) 10 MG tablet TAKE 1 TABLET(10 MG) BY MOUTH DAILY 07/30/18   Marcine Matar, MD  Multiple Vitamins-Minerals (WOMENS MULTI) CAPS Take 1 capsule by mouth daily.    [provider]  mycophenolate (CELLCEPT) 500 MG tablet Take 500 mg by mouth 2 (two) times daily. 05/28/18   [provider]  nabumetone (RELAFEN) 500 MG tablet Take 1 tablet (500 mg total) by mouth 2 (two) times daily as needed. 06/08/18   Hilts, Casimiro Needle, MD  Omega-3 Fatty Acids (FISH OIL) 1000 MG CAPS Take 1 capsule by mouth daily.    [provider]  oxyCODONE-acetaminophen (PERCOCET/ROXICET) 5-325 MG tablet TK 1 OR 2 TS PO Q  6 H PRN P 05/29/18   [provider]  pantoprazole (PROTONIX) 40 MG tablet TAKE 1 TABLET(40 MG) BY MOUTH TWICE DAILY BEFORE A MEAL 11/16/18   Danis, Andreas Blower, MD  phentermine 15 MG capsule Take 15 mg by mouth every morning.    [provider]  promethazine (PHENERGAN) 25 MG tablet Take 1 tablet (25 mg total) by mouth every 8 (eight) hours as needed for nausea or vomiting. 12/08/17   Marcine Matar, MD  Respiratory Therapy Supplies (FLUTTER) DEVI As directed. 07/28/17   Mannam, Colbert Coyer, MD  Semaglutide (OZEMPIC, 1 MG/DOSE, [place]) Inject 1 mg into the skin once a week.    [provider]  Spacer/Aero-Holding Chambers (AEROCHAMBER MV) inhaler Use as instructed 01/05/18   Marcine Matar, MD  tiZANidine (ZANAFLEX) 4 MG tablet TAKE 1 TABLET(4 MG) BY MOUTH EVERY 6 HOURS AS NEEDED FOR MUSCLE SPASMS  04/16/18   Marcine Matar, MD    Vitamin D, Ergocalciferol, (DRISDOL) 50000 units CAPS capsule Take 50,000 Units by mouth once a week. 03/26/18   [provider]  VYVANSE 50 MG capsule Take 50 mg by mouth daily. 05/17/18   [provider]    Allergies    Bee venom, Cinnamon, Doxycycline, Ibuprofen, Lexapro [escitalopram oxalate], Latex, and Tape  Review of Systems   Review of Systems  Unable to perform ROS: Acuity of condition  Neurological: Positive for seizures.    Physical Exam Updated Vital Signs BP (!) 116/42   Pulse (!) 130   Temp 98.2 F (36.8 C) (Oral)   Resp (!) 33   SpO2 (!) 89%   Physical Exam Vitals and nursing note reviewed.  Constitutional:      General: She is in acute distress.     Appearance: She is well-developed. She is obese.  HENT:     Head: Normocephalic and atraumatic.     Nose: Nose normal.     Mouth/Throat:     Mouth: Mucous membranes are dry.  Eyes:     Pupils: Pupils are equal, round, and reactive to light.  Cardiovascular:     Rate and Rhythm: Regular rhythm. Tachycardia present.     Heart sounds: Normal heart sounds. No murmur heard.  No friction rub.  Pulmonary:     Effort: Pulmonary effort is normal. Tachypnea present.     Breath sounds: Normal breath sounds. No wheezing or rales.  Abdominal:     General: Bowel sounds are normal. There is no distension.     Palpations: Abdomen is soft.     Tenderness: There is no abdominal tenderness. There is no guarding or rebound.  Musculoskeletal:        General: No tenderness. Normal range of motion.     Cervical back: Normal range of motion and neck supple.     Right lower leg: No edema.     Left lower leg: No edema.     Comments: No edema  Skin:    General: Skin is warm and dry.     Findings: No rash.  Neurological:     Mental Status: She is alert.     Comments: Ongoing jerking present in the left upper arm with weakness in the left lower extremity.  Patient intermittently responding to questions but  unable to stay focused and intermittently going to sleep.  Psychiatric:     Comments: Lethargic and intermittently responding      ED Results / Procedures / Treatments   Labs (all labs ordered are listed, but only abnormal results are displayed) Labs Reviewed  BASIC METABOLIC PANEL - Abnormal; Notable for the following components:      Result Value   Potassium 2.7 (*)    Chloride 79 (*)    CO2 34 (*)    Glucose, Bld 1,186 (*)    BUN 21 (*)    Creatinine, Ser 2.13 (*)    GFR calc non Af Amer 29 (*)    GFR calc Af Amer 34 (*)    Anion gap 25 (*)    All other components within normal limits  CBC WITH DIFFERENTIAL/PLATELET - Abnormal; Notable for the following components:   WBC 10.7 (*)    RBC 6.46 (*)    Hemoglobin 18.9 (*)    HCT 67.2 (*)    MCV 104.0 (*)    MCHC 28.1 (*)    Neutro Abs 7.9 (*)  All other components within normal limits  HEPATIC FUNCTION PANEL - Abnormal; Notable for the following components:   Total Protein 8.3 (*)    AST 76 (*)    ALT 101 (*)    Alkaline Phosphatase 369 (*)    Total Bilirubin 1.9 (*)    Bilirubin, Direct 0.9 (*)    Indirect Bilirubin 1.0 (*)    All other components within normal limits  LACTIC ACID, PLASMA - Abnormal; Notable for the following components:   Lactic Acid, Venous 10.8 (*)    All other components within normal limits  ACETAMINOPHEN LEVEL - Abnormal; Notable for the following components:   Acetaminophen (Tylenol), Serum <10 (*)    All other components within normal limits  SALICYLATE LEVEL - Abnormal; Notable for the following components:   Salicylate Lvl <6.9 (*)    All other components within normal limits  MAGNESIUM - Abnormal; Notable for the following components:   Magnesium 4.3 (*)    All other components within normal limits  CBG MONITORING, ED - Abnormal; Notable for the following components:   Glucose-Capillary >600 (*)    All other components within normal limits  I-STAT VENOUS BLOOD GAS, ED - Abnormal;  Notable for the following components:   pH, Ven 7.436 (*)    pCO2, Ven 65.7 (*)    pO2, Ven 24.0 (*)    Bicarbonate 44.3 (*)    TCO2 46 (*)    Acid-Base Excess 15.0 (*)    Potassium 2.4 (*)    Calcium, Ion 1.01 (*)    HCT 64.0 (*)    Hemoglobin 21.8 (*)    All other components within normal limits  SARS CORONAVIRUS 2 BY RT PCR (HOSPITAL ORDER, [place] [facility])  RAPID URINE DRUG SCREEN, HOSP PERFORMED  LACTIC ACID, PLASMA  CBG MONITORING, ED  I-STAT BETA HCG BLOOD, ED ([facility], [facility], [facility] ONLY)    EKG EKG Interpretation  Date/Time:  Friday December 27 2019 19:50:42 EDT Ventricular Rate:  135 PR Interval:    QRS Duration: 93 QT Interval:  403 QTC Calculation: 605 R Axis:   25 Text Interpretation: Sinus tachycardia Left atrial enlargement Borderline ST depression, diffuse leads Prolonged QT interval No significant change since last tracing Confirmed by Blanchie Dessert [phone]) on 01/04/2020 8:30:55 PM   Radiology CT HEAD WO CONTRAST  Result Date: 01/15/2020 CLINICAL DATA:  Seizure, nontraumatic (Age 80-40y). Persistent twitching. EXAM: CT HEAD WITHOUT CONTRAST TECHNIQUE: Contiguous axial images were obtained from the base of the skull through the vertex without intravenous contrast. COMPARISON:  Most recent head CT 11/25/2017 FINDINGS: Brain: Technically limited evaluation due to patient twitching and soft tissue attenuation from habitus. Allowing for these limitations, no evidence of hemorrhage, hydrocephalus, or acute ischemia. No midline shift. Vascular: Cannot assess for hyperdense vessel given motion. Skull: No fracture or focal lesion. Sinuses/Orbits: No obvious acute finding. Other: None. IMPRESSION: Technically limited evaluation due to patient twitching and soft tissue attenuation from habitus. Allowing for these limitations, no evidence of acute abnormality. Consider follow-up exam when there is the possibility of decreased patient motion based on clinical concern.  Electronically Signed   By: Keith Rake M.D.   On: 12/17/2019 21:29   DG Chest Port 1 View  Result Date: 12/19/2019 CLINICAL DATA:  Cough EXAM: PORTABLE CHEST 1 VIEW COMPARISON:  01/29/2018 FINDINGS: Low lung volumes. Cardiomegaly with vascular congestion. No confluent opacities or effusions. No acute bony abnormality. IMPRESSION: Low lung volumes.  Cardiomegaly with vascular congestion. Electronically Signed  By: Charlett Nose M.D.   On: 12/31/2019 22:03    Procedures Procedures (including critical care time)  Medications Ordered in ED Medications  potassium chloride 10 mEq in 100 mL IVPB (10 mEq Intravenous New Bag/Given 12/18/2019 2129)  levETIRAcetam (KEPPRA) IVPB 1500 mg/ 100 mL premix (has no administration in time range)  LORazepam (ATIVAN) injection 0.5 mg (0.5 mg Intravenous Given 01/07/2020 2025)  lactated ringers bolus 1,000 mL (1,000 mLs Intravenous New Bag/Given 12/28/2019 2132)  lactated ringers bolus 1,000 mL (1,000 mLs Intravenous New Bag/Given 01/03/2020 2130)  lactated ringers bolus 1,000 mL (1,000 mLs Intravenous New Bag/Given 01/11/2020 2204)    ED Course  I have reviewed the triage vital signs and the nursing notes.  Pertinent labs & imaging results that were available during my care of the patient were reviewed by me and considered in my medical decision making (see chart for details).    MDM Rules/Calculators/A&P                          Patient is a 34 year old female presenting today with altered mental., clonic jerking in the left upper extremity and hyperglycemia.  Patient's blood sugar today is almost 1200 with known history of diabetes that is poorly controlled.  She recently changed to Ozempic and takes Lantus and insulin but is unclear when she last checked her blood sugar.  She has had polyuria and polydipsia and has been reports a mild cough but no other significant infectious symptoms.  On exam concern for partial status epilepticus.  However patient's symptoms  have been ongoing since noon or earlier.  Patient is currently still protecting her airway and is intermittently responsive.  She remains tachycardic and has a lactic acidosis of 10 and anion gap of 25.  However patient's pH is within normal limits.  She does have new AKI and hypokalemia in the setting of the hyperglycemia.  Given patient's concern for seizing, elevated blood sugar and altered mental status feel that she requires ICU care.  She received 3 L of fluid at this time is afebrile and has no concern for infection.  Chest x-ray and head CT without acute findings.  Patient was placed on 3 L of oxygen due to desaturation.  Blood gas with elevated CO2 of 65 and bicarb of 44, hypermagnesemia 4.3 and mild elevation of LFTs.  CBC without significant.  Patient was given potassium infusion and loaded with Keppra.  Neurology saw the patient will place on EEG.  Patient will be admitted to the ICU.  CRITICAL CARE Performed by: Luvina Poirier Total critical care time: 30 minutes Critical care time was exclusive of separately billable procedures and treating other patients. Critical care was necessary to treat or prevent imminent or life-threatening deterioration. Critical care was time spent personally by me on the following activities: development of treatment plan with patient and/or surrogate as well as nursing, discussions with consultants, evaluation of patient's response to treatment, examination of patient, obtaining history from patient or surrogate, ordering and performing treatments and interventions, ordering and review of laboratory studies, ordering and review of radiographic studies, pulse oximetry and re-evaluation of patient's condition.  MDM Number of Diagnoses or Management Options   Amount and/or Complexity of Data Reviewed Clinical lab tests: ordered and reviewed Tests in the radiology section of CPT: ordered and reviewed Tests in the medicine section of CPT: ordered and  reviewed Decide to obtain previous medical records or to obtain history from someone other  than the patient: yes Obtain history from someone other than the patient: yes Review and summarize past medical records: yes Discuss the patient with other providers: yes Independent visualization of images, tracings, or specimens: yes  Risk of Complications, Morbidity, and/or Mortality Presenting problems: high Diagnostic procedures: moderate Management options: moderate  Patient Progress Patient progress: stable  Final Clinical Impression(s) / ED Diagnoses Final diagnoses:  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

## 2019-12-27 NOTE — ED Triage Notes (Signed)
Pt brought in via [facility] from home for focal seizures   Pt hasn't been feeling well for the past few days, has been seen for her left arm twitching/ seizures, prescribed versed without relief. Pt is not on any other medication for seizures.   Pt given 5mg  Versed with EMS.  Pt a&ox4 on arrival, twitching sustained.   CBG on arrival reading "HI"

## 2019-12-28 ENCOUNTER — Inpatient Hospital Stay (HOSPITAL_COMMUNITY): Payer: Medicaid Other

## 2019-12-28 DIAGNOSIS — R6521 Severe sepsis with septic shock: Secondary | ICD-10-CM

## 2019-12-28 DIAGNOSIS — N179 Acute kidney failure, unspecified: Secondary | ICD-10-CM

## 2019-12-28 DIAGNOSIS — E1101 Type 2 diabetes mellitus with hyperosmolarity with coma: Secondary | ICD-10-CM

## 2019-12-28 DIAGNOSIS — R4182 Altered mental status, unspecified: Secondary | ICD-10-CM

## 2019-12-28 DIAGNOSIS — A419 Sepsis, unspecified organism: Principal | ICD-10-CM

## 2019-12-28 LAB — URINALYSIS, ROUTINE W REFLEX MICROSCOPIC
Bilirubin Urine: NEGATIVE
Glucose, UA: >=500 mg/dL — AB
Ketones, ur: NEGATIVE mg/dL
Leukocytes,Ua: NEGATIVE
Nitrite: NEGATIVE
Protein, ur: 100 mg/dL — AB
Specific Gravity, Urine: 1.025 (ref 1.005–1.030)
pH: 5 (ref 5.0–8.0)

## 2019-12-28 LAB — GLUCOSE, CAPILLARY
Glucose-Capillary: 194 mg/dL — ABNORMAL HIGH (ref 70–99)
Glucose-Capillary: 221 mg/dL — ABNORMAL HIGH (ref 70–99)
Glucose-Capillary: 225 mg/dL — ABNORMAL HIGH (ref 70–99)
Glucose-Capillary: 231 mg/dL — ABNORMAL HIGH (ref 70–99)
Glucose-Capillary: 235 mg/dL — ABNORMAL HIGH (ref 70–99)
Glucose-Capillary: 242 mg/dL — ABNORMAL HIGH (ref 70–99)
Glucose-Capillary: 245 mg/dL — ABNORMAL HIGH (ref 70–99)
Glucose-Capillary: 246 mg/dL — ABNORMAL HIGH (ref 70–99)
Glucose-Capillary: 247 mg/dL — ABNORMAL HIGH (ref 70–99)
Glucose-Capillary: 249 mg/dL — ABNORMAL HIGH (ref 70–99)
Glucose-Capillary: 249 mg/dL — ABNORMAL HIGH (ref 70–99)
Glucose-Capillary: 260 mg/dL — ABNORMAL HIGH (ref 70–99)
Glucose-Capillary: 267 mg/dL — ABNORMAL HIGH (ref 70–99)
Glucose-Capillary: 386 mg/dL — ABNORMAL HIGH (ref 70–99)
Glucose-Capillary: 412 mg/dL — ABNORMAL HIGH (ref 70–99)
Glucose-Capillary: 438 mg/dL — ABNORMAL HIGH (ref 70–99)
Glucose-Capillary: 557 mg/dL (ref 70–99)
Glucose-Capillary: >600 mg/dL (ref 70–99)
Glucose-Capillary: >600 mg/dL (ref 70–99)
Glucose-Capillary: >600 mg/dL (ref 70–99)
Glucose-Capillary: >600 mg/dL (ref 70–99)
Glucose-Capillary: >600 mg/dL (ref 70–99)

## 2019-12-28 LAB — CBC
HCT: 56.8 % — ABNORMAL HIGH (ref 36.0–46.0)
Hemoglobin: 17 g/dL — ABNORMAL HIGH (ref 12.0–15.0)
MCH: 29.5 pg (ref 26.0–34.0)
MCHC: 29.9 g/dL — ABNORMAL LOW (ref 30.0–36.0)
MCV: 98.6 fL (ref 80.0–100.0)
Platelets: 201 10*3/uL (ref 150–400)
RBC: 5.76 MIL/uL — ABNORMAL HIGH (ref 3.87–5.11)
RDW: 14.8 % (ref 11.5–15.5)
WBC: 14.3 10*3/uL — ABNORMAL HIGH (ref 4.0–10.5)
nRBC: 0.3 % — ABNORMAL HIGH (ref 0.0–0.2)

## 2019-12-28 LAB — BASIC METABOLIC PANEL
Anion gap: 26 — ABNORMAL HIGH (ref 5–15)
Anion gap: 18 — ABNORMAL HIGH (ref 5–15)
Anion gap: 19 — ABNORMAL HIGH (ref 5–15)
Anion gap: 16 — ABNORMAL HIGH (ref 5–15)
BUN: 27 mg/dL — ABNORMAL HIGH (ref 6–20)
BUN: 31 mg/dL — ABNORMAL HIGH (ref 6–20)
BUN: 35 mg/dL — ABNORMAL HIGH (ref 6–20)
BUN: 36 mg/dL — ABNORMAL HIGH (ref 6–20)
CO2: 28 mmol/L (ref 22–32)
CO2: 35 mmol/L — ABNORMAL HIGH (ref 22–32)
CO2: 30 mmol/L (ref 22–32)
CO2: 31 mmol/L (ref 22–32)
Calcium: 9.3 mg/dL (ref 8.9–10.3)
Calcium: 8.8 mg/dL — ABNORMAL LOW (ref 8.9–10.3)
Calcium: 7.6 mg/dL — ABNORMAL LOW (ref 8.9–10.3)
Calcium: 7.3 mg/dL — ABNORMAL LOW (ref 8.9–10.3)
Chloride: 91 mmol/L — ABNORMAL LOW (ref 98–111)
Chloride: 98 mmol/L (ref 98–111)
Chloride: 100 mmol/L (ref 98–111)
Chloride: 101 mmol/L (ref 98–111)
Creatinine, Ser: 2.84 mg/dL — ABNORMAL HIGH (ref 0.44–1.00)
Creatinine, Ser: 3.44 mg/dL — ABNORMAL HIGH (ref 0.44–1.00)
Creatinine, Ser: 4.69 mg/dL — ABNORMAL HIGH (ref 0.44–1.00)
Creatinine, Ser: 5.15 mg/dL — ABNORMAL HIGH (ref 0.44–1.00)
GFR calc Af Amer: 24 mL/min — ABNORMAL LOW (ref >60)
GFR calc Af Amer: 20 mL/min — ABNORMAL LOW (ref >60)
GFR calc Af Amer: 13 mL/min — ABNORMAL LOW (ref >60)
GFR calc Af Amer: 12 mL/min — ABNORMAL LOW (ref >60)
GFR calc non Af Amer: 21 mL/min — ABNORMAL LOW (ref >60)
GFR calc non Af Amer: 17 mL/min — ABNORMAL LOW (ref >60)
GFR calc non Af Amer: 11 mL/min — ABNORMAL LOW (ref >60)
GFR calc non Af Amer: 10 mL/min — ABNORMAL LOW (ref >60)
Glucose, Bld: 748 mg/dL (ref 70–99)
Glucose, Bld: 285 mg/dL — ABNORMAL HIGH (ref 70–99)
Glucose, Bld: 239 mg/dL — ABNORMAL HIGH (ref 70–99)
Glucose, Bld: 270 mg/dL — ABNORMAL HIGH (ref 70–99)
Potassium: 3.3 mmol/L — ABNORMAL LOW (ref 3.5–5.1)
Potassium: 2.6 mmol/L — CL (ref 3.5–5.1)
Potassium: 3 mmol/L — ABNORMAL LOW (ref 3.5–5.1)
Potassium: 2.8 mmol/L — ABNORMAL LOW (ref 3.5–5.1)
Sodium: 145 mmol/L (ref 135–145)
Sodium: 151 mmol/L — ABNORMAL HIGH (ref 135–145)
Sodium: 149 mmol/L — ABNORMAL HIGH (ref 135–145)
Sodium: 148 mmol/L — ABNORMAL HIGH (ref 135–145)

## 2019-12-28 LAB — PREGNANCY, URINE: Preg Test, Ur: NEGATIVE

## 2019-12-28 LAB — POCT I-STAT EG7
Acid-Base Excess: 5 mmol/L — ABNORMAL HIGH (ref 0.0–2.0)
Bicarbonate: 31.3 mmol/L — ABNORMAL HIGH (ref 20.0–28.0)
Calcium, Ion: 0.92 mmol/L — ABNORMAL LOW (ref 1.15–1.40)
HCT: 51 % — ABNORMAL HIGH (ref 36.0–46.0)
Hemoglobin: 17.3 g/dL — ABNORMAL HIGH (ref 12.0–15.0)
O2 Saturation: 50 %
Patient temperature: 103.5
Potassium: 2.8 mmol/L — ABNORMAL LOW (ref 3.5–5.1)
Sodium: 148 mmol/L — ABNORMAL HIGH (ref 135–145)
TCO2: 33 mmol/L — ABNORMAL HIGH (ref 22–32)
pCO2, Ven: 55.9 mmHg (ref 44.0–60.0)
pH, Ven: 7.367 (ref 7.250–7.430)
pO2, Ven: 33 mmHg (ref 32.0–45.0)

## 2019-12-28 LAB — LACTIC ACID, PLASMA
Lactic Acid, Venous: 10.1 mmol/L (ref 0.5–1.9)
Lactic Acid, Venous: >11 mmol/L (ref 0.5–1.9)

## 2019-12-28 LAB — RAPID URINE DRUG SCREEN, HOSP PERFORMED
Amphetamines: NOT DETECTED
Barbiturates: NOT DETECTED
Benzodiazepines: POSITIVE — AB
Cocaine: NOT DETECTED
Opiates: NOT DETECTED
Tetrahydrocannabinol: NOT DETECTED

## 2019-12-28 LAB — TRIGLYCERIDES: Triglycerides: 1475 mg/dL — ABNORMAL HIGH (ref <150)

## 2019-12-28 LAB — BRAIN NATRIURETIC PEPTIDE: B Natriuretic Peptide: 138.6 pg/mL — ABNORMAL HIGH (ref 0.0–100.0)

## 2019-12-28 LAB — HIV ANTIBODY (ROUTINE TESTING W REFLEX): HIV Screen 4th Generation wRfx: NONREACTIVE

## 2019-12-28 LAB — POCT ACTIVATED CLOTTING TIME: Activated Clotting Time: 731 seconds

## 2019-12-28 LAB — BETA-HYDROXYBUTYRIC ACID: Beta-Hydroxybutyric Acid: 0.41 mmol/L — ABNORMAL HIGH (ref 0.05–0.27)

## 2019-12-28 LAB — MRSA PCR SCREENING: MRSA by PCR: NEGATIVE

## 2019-12-28 LAB — OSMOLALITY
Osmolality: 363 mOsm/kg (ref 275–295)
Osmolality: 311 mOsm/kg — ABNORMAL HIGH (ref 275–295)

## 2019-12-28 LAB — AMMONIA: Ammonia: 77 umol/L — ABNORMAL HIGH (ref 9–35)

## 2019-12-28 LAB — TROPONIN I (HIGH SENSITIVITY): Troponin I (High Sensitivity): 151 ng/L (ref <18)

## 2019-12-28 LAB — LIPASE, BLOOD: Lipase: 1652 U/L — ABNORMAL HIGH (ref 11–51)

## 2019-12-28 MED ORDER — SODIUM CHLORIDE 0.45 % IV SOLN: Freq: Once | INTRAVENOUS | Status: AC

## 2019-12-28 MED ORDER — VANCOMYCIN VARIABLE DOSE PER UNSTABLE RENAL FUNCTION (PHARMACIST DOSING): Status: DC

## 2019-12-28 MED ORDER — SODIUM CHLORIDE 0.9% FLUSH: 10.0000 mL | INTRAVENOUS | Status: DC | PRN

## 2019-12-28 MED ORDER — FUROSEMIDE 10 MG/ML IJ SOLN
40.0000 mg | Freq: Once | INTRAMUSCULAR | Status: AC
  Administered 2019-12-28: 40 mg via INTRAVENOUS
  Filled 2019-12-28: qty 4

## 2019-12-28 MED ORDER — DOCUSATE SODIUM 50 MG/5ML PO LIQD
100.0000 mg | Freq: Two times a day (BID) | ORAL | Status: DC
  Administered 2019-12-28 – 2020-01-07 (×21): 100 mg
  Filled 2019-12-28 (×21): qty 10

## 2019-12-28 MED ORDER — HYDROCORTISONE NA SUCCINATE PF 100 MG IJ SOLR
100.0000 mg | Freq: Once | INTRAMUSCULAR | Status: AC
  Administered 2019-12-28: 100 mg via INTRAVENOUS
  Filled 2019-12-28: qty 2

## 2019-12-28 MED ORDER — POLYETHYLENE GLYCOL 3350 17 G PO PACK
17.0000 g | PACK | Freq: Every day | ORAL | Status: DC
  Administered 2019-12-30 – 2020-01-07 (×9): 17 g
  Filled 2019-12-28 (×10): qty 1

## 2019-12-28 MED ORDER — POTASSIUM CHLORIDE 10 MEQ/50ML IV SOLN
10.0000 meq | INTRAVENOUS | Status: AC
  Administered 2019-12-28 (×2): 10 meq via INTRAVENOUS
  Filled 2019-12-28 (×2): qty 50

## 2019-12-28 MED ORDER — VANCOMYCIN HCL 10 G IV SOLR
2500.0000 mg | Freq: Once | INTRAVENOUS | Status: AC
  Administered 2019-12-28: 2500 mg via INTRAVENOUS
  Filled 2019-12-28: qty 2500

## 2019-12-28 MED ORDER — ORAL CARE MOUTH RINSE
15.0000 mL | OROMUCOSAL | Status: DC
  Administered 2019-12-28 – 2020-01-08 (×107): 15 mL via OROMUCOSAL

## 2019-12-28 MED ORDER — FENTANYL CITRATE (PF) 100 MCG/2ML IJ SOLN
INTRAMUSCULAR | Status: AC
  Administered 2019-12-28: 50 ug via INTRAVENOUS
  Filled 2019-12-28: qty 2

## 2019-12-28 MED ORDER — SODIUM CHLORIDE 0.9 % IV SOLN
15.0000 mg/kg | Freq: Once | INTRAVENOUS | Status: AC
  Administered 2019-12-28: 2520 mg via INTRAVENOUS
  Filled 2019-12-28: qty 2.52

## 2019-12-28 MED ORDER — PROPOFOL 1000 MG/100ML IV EMUL
INTRAVENOUS | Status: AC
  Filled 2019-12-28: qty 100

## 2019-12-28 MED ORDER — CHLORHEXIDINE GLUCONATE CLOTH 2 % EX PADS
6.0000 | MEDICATED_PAD | Freq: Every day | CUTANEOUS | Status: DC
  Administered 2019-12-28 – 2020-01-08 (×12): 6 via TOPICAL

## 2019-12-28 MED ORDER — MIDAZOLAM HCL 2 MG/2ML IJ SOLN
INTRAMUSCULAR | Status: AC
  Administered 2019-12-28: 2 mg via INTRAVENOUS
  Filled 2019-12-28: qty 2

## 2019-12-28 MED ORDER — MIDAZOLAM HCL 2 MG/2ML IJ SOLN: 2.0000 mg | Freq: Once | INTRAMUSCULAR | Status: AC

## 2019-12-28 MED ORDER — SODIUM CHLORIDE 0.45 % IV BOLUS: 500.0000 mL | Freq: Once | INTRAVENOUS | Status: DC

## 2019-12-28 MED ORDER — ALBUMIN HUMAN 25 % IV SOLN
12.5000 g | Freq: Once | INTRAVENOUS | Status: AC
  Administered 2019-12-28: 12.5 g via INTRAVENOUS
  Filled 2019-12-28: qty 50

## 2019-12-28 MED ORDER — FENTANYL BOLUS VIA INFUSION
50.0000 ug | INTRAVENOUS | Status: DC | PRN
  Administered 2019-12-28 – 2020-01-03 (×16): 50 ug via INTRAVENOUS
  Administered 2020-01-06: 25 ug via INTRAVENOUS
  Filled 2019-12-28: qty 50

## 2019-12-28 MED ORDER — IBUPROFEN 100 MG/5ML PO SUSP
400.0000 mg | Freq: Once | ORAL | Status: AC
  Administered 2019-12-28: 400 mg via ORAL
  Filled 2019-12-28: qty 20

## 2019-12-28 MED ORDER — NOREPINEPHRINE 4 MG/250ML-% IV SOLN
0.0000 ug/min | INTRAVENOUS | Status: DC
  Administered 2019-12-28: 20 ug/min via INTRAVENOUS
  Administered 2019-12-28: 32 ug/min via INTRAVENOUS
  Administered 2019-12-28: 2 ug/min via INTRAVENOUS
  Administered 2019-12-28: 10 ug/min via INTRAVENOUS
  Filled 2019-12-28 (×4): qty 250

## 2019-12-28 MED ORDER — SODIUM CHLORIDE 0.9 % IV SOLN: 2.0000 g | Freq: Once | INTRAVENOUS | Status: DC

## 2019-12-28 MED ORDER — PIPERACILLIN-TAZOBACTAM 3.375 G IVPB 30 MIN
3.3750 g | Freq: Once | INTRAVENOUS | Status: AC
  Administered 2019-12-28: 3.375 g via INTRAVENOUS
  Filled 2019-12-28: qty 50

## 2019-12-28 MED ORDER — NOREPINEPHRINE 16 MG/250ML-% IV SOLN
0.0000 ug/min | INTRAVENOUS | Status: DC
  Administered 2019-12-28: 38 ug/min via INTRAVENOUS
  Administered 2019-12-29 (×3): 40 ug/min via INTRAVENOUS
  Administered 2019-12-30: 32 ug/min via INTRAVENOUS
  Administered 2019-12-30: 8 ug/min via INTRAVENOUS
  Administered 2019-12-31: 14 ug/min via INTRAVENOUS
  Administered 2020-01-01: 40 ug/min via INTRAVENOUS
  Administered 2020-01-01: 36 ug/min via INTRAVENOUS
  Administered 2020-01-01: 33 ug/min via INTRAVENOUS
  Administered 2020-01-02: 12 ug/min via INTRAVENOUS
  Administered 2020-01-03: 28 ug/min via INTRAVENOUS
  Administered 2020-01-03: 34 ug/min via INTRAVENOUS
  Administered 2020-01-04: 50 ug/min via INTRAVENOUS
  Administered 2020-01-04: 32 ug/min via INTRAVENOUS
  Administered 2020-01-04: 39 ug/min via INTRAVENOUS
  Administered 2020-01-05: 40 ug/min via INTRAVENOUS
  Administered 2020-01-05: 12 ug/min via INTRAVENOUS
  Administered 2020-01-05: 29 ug/min via INTRAVENOUS
  Administered 2020-01-07: 54 ug/min via INTRAVENOUS
  Administered 2020-01-07: 2 ug/min via INTRAVENOUS
  Administered 2020-01-07: 30 ug/min via INTRAVENOUS
  Administered 2020-01-08: 63 ug/min via INTRAVENOUS
  Administered 2020-01-08 (×2): 80 ug/min via INTRAVENOUS
  Administered 2020-01-08: 75 ug/min via INTRAVENOUS
  Filled 2019-12-28 (×26): qty 250

## 2019-12-28 MED ORDER — LACTATED RINGERS IV BOLUS
1000.0000 mL | Freq: Once | INTRAVENOUS | Status: AC
  Administered 2019-12-28: 1000 mL via INTRAVENOUS

## 2019-12-28 MED ORDER — LEVETIRACETAM IN NACL 500 MG/100ML IV SOLN
500.0000 mg | Freq: Two times a day (BID) | INTRAVENOUS | Status: DC
  Administered 2019-12-28 – 2019-12-30 (×5): 500 mg via INTRAVENOUS
  Filled 2019-12-28 (×5): qty 100

## 2019-12-28 MED ORDER — VASOPRESSIN 20 UNIT/ML IV SOLN
0.0100 [IU]/min | INTRAVENOUS | Status: DC
  Administered 2019-12-28 – 2019-12-29 (×3): 0.04 [IU]/min via INTRAVENOUS
  Filled 2019-12-28 (×5): qty 2

## 2019-12-28 MED ORDER — POTASSIUM CHLORIDE 10 MEQ/50ML IV SOLN
10.0000 meq | INTRAVENOUS | Status: AC
  Administered 2019-12-28 (×4): 10 meq via INTRAVENOUS
  Filled 2019-12-28 (×4): qty 50

## 2019-12-28 MED ORDER — SODIUM CHLORIDE 0.9% FLUSH
10.0000 mL | Freq: Two times a day (BID) | INTRAVENOUS | Status: DC
  Administered 2019-12-28 – 2020-01-07 (×21): 10 mL

## 2019-12-28 MED ORDER — PROPOFOL 1000 MG/100ML IV EMUL: 0.0000 ug/kg/min | INTRAVENOUS | Status: DC

## 2019-12-28 MED ORDER — MIDAZOLAM HCL 2 MG/2ML IJ SOLN
2.0000 mg | INTRAMUSCULAR | Status: DC | PRN
  Administered 2019-12-29 – 2020-01-08 (×9): 2 mg via INTRAVENOUS
  Filled 2019-12-28 (×7): qty 2

## 2019-12-28 MED ORDER — ALBUMIN HUMAN 5 % IV SOLN
25.0000 g | Freq: Once | INTRAVENOUS | Status: AC
  Administered 2019-12-28: 25 g via INTRAVENOUS
  Filled 2019-12-28: qty 500

## 2019-12-28 MED ORDER — ETOMIDATE 2 MG/ML IV SOLN
10.0000 mg | Freq: Once | INTRAVENOUS | Status: AC
  Administered 2019-12-28: 10 mg via INTRAVENOUS

## 2019-12-28 MED ORDER — FENTANYL CITRATE (PF) 100 MCG/2ML IJ SOLN: 100.0000 ug | Freq: Once | INTRAMUSCULAR | Status: AC

## 2019-12-28 MED ORDER — SODIUM CHLORIDE 0.9 % IV SOLN
2000.0000 mg | Freq: Once | INTRAVENOUS | Status: AC
  Administered 2019-12-28: 2000 mg via INTRAVENOUS
  Filled 2019-12-28: qty 20

## 2019-12-28 MED ORDER — FENTANYL CITRATE (PF) 100 MCG/2ML IJ SOLN: 50.0000 ug | Freq: Once | INTRAMUSCULAR | Status: AC

## 2019-12-28 MED ORDER — CHLORHEXIDINE GLUCONATE 0.12% ORAL RINSE (MEDLINE KIT)
15.0000 mL | Freq: Two times a day (BID) | OROMUCOSAL | Status: DC
  Administered 2019-12-28 – 2020-01-08 (×22): 15 mL via OROMUCOSAL

## 2019-12-28 MED ORDER — PIPERACILLIN-TAZOBACTAM 3.375 G IVPB
3.3750 g | Freq: Three times a day (TID) | INTRAVENOUS | Status: DC
  Administered 2019-12-28 – 2019-12-29 (×3): 3.375 g via INTRAVENOUS
  Filled 2019-12-28 (×4): qty 50

## 2019-12-28 MED ORDER — FENTANYL 2500MCG IN NS 250ML (10MCG/ML) PREMIX INFUSION
50.0000 ug/h | INTRAVENOUS | Status: DC
  Administered 2019-12-28 – 2019-12-30 (×2): 50 ug/h via INTRAVENOUS
  Administered 2019-12-31 – 2020-01-01 (×2): 100 ug/h via INTRAVENOUS
  Administered 2020-01-02: 75 ug/h via INTRAVENOUS
  Administered 2020-01-03: 100 ug/h via INTRAVENOUS
  Administered 2020-01-06: 50 ug/h via INTRAVENOUS
  Administered 2020-01-07: 200 ug/h via INTRAVENOUS
  Administered 2020-01-08: 150 ug/h via INTRAVENOUS
  Filled 2019-12-28 (×9): qty 250

## 2019-12-28 MED ORDER — SODIUM CHLORIDE 0.9 % IV SOLN
INTRAVENOUS | Status: DC | PRN
  Administered 2019-12-28: 500 mL via INTRAVENOUS
  Administered 2019-12-28: 250 mL via INTRAVENOUS

## 2019-12-28 MED ORDER — FENTANYL CITRATE (PF) 100 MCG/2ML IJ SOLN
INTRAMUSCULAR | Status: AC
  Administered 2019-12-28: 100 ug via INTRAVENOUS
  Filled 2019-12-28: qty 2

## 2019-12-28 MED ORDER — MIDAZOLAM HCL 2 MG/2ML IJ SOLN
2.0000 mg | INTRAMUSCULAR | Status: AC | PRN
  Administered 2019-12-30 – 2019-12-31 (×3): 2 mg via INTRAVENOUS
  Filled 2019-12-28 (×4): qty 2

## 2019-12-28 MED ORDER — CHLORHEXIDINE GLUCONATE 0.12 % MT SOLN
15.0000 mL | Freq: Two times a day (BID) | OROMUCOSAL | Status: DC
  Administered 2019-12-28 (×2): 15 mL via OROMUCOSAL
  Filled 2019-12-28: qty 15

## 2019-12-28 MED ORDER — ROCURONIUM BROMIDE 50 MG/5ML IV SOLN
40.0000 mg | Freq: Once | INTRAVENOUS | Status: AC
  Administered 2019-12-28: 40 mg via INTRAVENOUS
  Filled 2019-12-28: qty 4

## 2019-12-28 MED ORDER — ORAL CARE MOUTH RINSE
15.0000 mL | Freq: Two times a day (BID) | OROMUCOSAL | Status: DC
  Administered 2019-12-28 (×2): 15 mL via OROMUCOSAL

## 2019-12-28 MED ORDER — FREE WATER
200.0000 mL | Status: DC
  Administered 2019-12-28 – 2019-12-31 (×18): 200 mL

## 2019-12-28 MED ORDER — SODIUM CHLORIDE 0.9 % IV SOLN: 2.0000 g | Freq: Three times a day (TID) | INTRAVENOUS | Status: DC

## 2019-12-28 MED ORDER — PANTOPRAZOLE SODIUM 40 MG PO PACK
40.0000 mg | PACK | Freq: Every day | ORAL | Status: DC
  Administered 2019-12-28 – 2020-01-07 (×11): 40 mg
  Filled 2019-12-28 (×12): qty 20

## 2019-12-28 NOTE — Procedures (Signed)
Central Venous Catheter Insertion Procedure Note SUPRENA TRAVAGLINI [id number] March 30, 1986  Procedure: Insertion of Central Venous Catheter Indications: Drug and/or fluid administration  Procedure Details Consent: Unable to obtain consent because of emergent medical necessity. Time Out: Verified patient identification, verified procedure, site/side was marked, verified correct patient position, special equipment/implants available, medications/allergies/relevent history reviewed, required imaging and test results available.  Performed  Maximum sterile technique was used including antiseptics, cap, gloves, gown, hand hygiene, mask and sheet. Skin prep: Chlorhexidine; local anesthetic administered A antimicrobial bonded/coated triple lumen catheter was placed in the right internal jugular vein using the Seldinger technique.  Evaluation Blood flow good Complications: No apparent complications Patient did tolerate procedure well. Chest X-ray ordered to verify placement.  CXR: normal.  Jennae Hakeem A Ehren Berisha 12/28/2019, 9:12 AM

## 2019-12-28 NOTE — Progress Notes (Signed)
LTM maint complete - no skin breakdown under: Fp1  Fp2  F4 F3  F7 F8 A1.  Patient had a few leads off due to transport.

## 2019-12-28 NOTE — H&P (Addendum)
NAME:  Courtney Zamora, MRN:  [id number], DOB:  08-08-1985, LOS: 1 ADMISSION DATE:  18-Jan-2020, CONSULTATION DATE:  12/28/19 REFERRING MD:  EDP, CHIEF COMPLAINT:  seizures   Brief History   34 y.o. F with PMH of RA, Type 2 DM, possible seizures, HTN who presented with shaking of abnormal jerking movements thought to be seizures, AMS and hyperglycemia.  Pt loaded with Keppra, head CT negative, also developed fever and had a significant lactic acidosis.  Given AMS, PCCM consulted for admission.  History of present illness   Courtney Zamora is a  RA on cellcept, Type 2 DM, possible seizures (no prior EEG and no medications per husband), HTN who presented with shaking of abnormal jerking movements thought to be seizures, AMS and hyperglycemia.   He reports that about two days ago she was having trouble holding a cup because of weakness and was only eating fruit because everything tasted metallic.  He states her blood sugar was high, but it usually is.  She had some constipation, but did not complain of abdominal pain, diarrhea, nausea, vomiting or fever.  Earlier in the day, he noted her L arm shaking, he thought she was having a seizure, though this was different than her usual seizure.  These he describes as being diagnosed by a PCP and not on any medication.  She was sometimes scratch her face and pull her hair and then be fine immediately afterwards, she does not seek medical care after these episodes.  In the ED, pt was having continued twitching/jerking of the L side and was loaded with Keppra. CT head negative.  ED course significant for worsening mental status, oxygen requirement, lactic acid 10.8, K 2.4, Glucose >1100, anion gap 25, Co2 34.  Pt was able to intermittently answer questions and c/o abdominal pain.   She was given K 14meq and 3L IVF, she then spiked a fever to 101F.  Pt seen by neurology and EEG did not show evidence of seizures.   PCCM consulted for admission  Past Medical History    has a past medical history of Anxiety, Asthma, Chronic headache, Chronic lower back pain, Depression, Essential hypertension, Family history of adverse reaction to anesthesia, GERD (gastroesophageal reflux disease), Manic state ([place]), Migraine, Obesity, OSA on CPAP, Osteoarthritis, knee, Pneumonia, Prolonged QT syndrome, PTSD (post-traumatic stress disorder), Rheumatoid arthritis ([place]), Seizures ([place]), Severe needle phobia, and Type II diabetes mellitus ([place]).   Significant Hospital Events   6/11 Admit to PCCM  Consults:  neurology  Procedures:    Significant Diagnostic Tests:  6/11 CT head>> limited due to patient motion, no acute findings 6/11 CT abd/pelvis>> There is some mild fat stranding about the pancreas, which may represent acute pancreatitis. There is some mild wall thickening of the duodenum, which may be reactive or secondary to duodenitis.   Micro Data:  6/12 BCx2>> 6/12 UC>>  Antimicrobials:  Zosyn 6/12- Vancomycin 6/12-  Interim history/subjective:  Altered, but protecting her airway  Objective   Blood pressure (!) 153/93, pulse (!) 128, temperature (!) 103.3 F (39.6 C), temperature source Oral, resp. rate (!) 26, height 5\' 4"  (1.626 m), weight (!) 168.2 kg, SpO2 95 %.       No intake or output data in the 24 hours ending 12/28/19 0110 Filed Weights   01/18/20 2341 12/28/19 0100  Weight: (!) 184.6 kg (!) 168.2 kg    General:  Obese F, diffuse shaking, restless HEENT: MM pink/moist Neuro: moaning, oriented to person and place, intermittently following  commands CV: s1s2 tachycardic, no m/r/g PULM:  Course breath sounds bilaterally,  GI: soft, obese, diffuse TTP, bsx4 active  Extremities: warm/dry, 1+ edema  Skin: no rashes or lesions MSK: No skin breakdown, no obvious nuchal rigidity  Resolved Hospital Problem list     Assessment & Plan:  34 y.o. F who presented with possible seizures and AMS,  found to have hyperglycemia, possible sepsis and  pancreatitis     DKA/HHS Initial glucose >1100 with gap of 25, pH 7.4 -start insulin gtt per endotool, aggressively replete K -check Beta hydroxy butyrate  -q4hrs BMP -Hgb A1c  Septic Shock Unclear source currently -has received 30cc/kg IVF -follow lactic acid -blood cultures -broad spectrum abx -consider w/u for viral encephalitis/tick borne illness and empiric treatment   Acute Hypoxic Respiratory Failure Possibly due to AMS, CXR with vascular congestion and cardiomegaly.  Last Echo in 2019 with preserved EF -Continue [place] supplementation to maintain sats >92% -check BNP and echocardogram    Abdominal pain and possible pancreatitis Peri-pancreatic stranding on CT -check lipase, follow lactic acid -Follow UA and urine/blood cultures -start broad spectrum abx with Zosyn/Vancomycin   Acute encephalopathy and possible seizures CT head was negative, I am unsure whether patient's reported seizure activity at home represents seizure activity, she is not on medication and per husband, will shake, but also scratch her face and pull her hair and then return to her baseline -Seen by neurology, appreciate recs.  Load with Keppra 20mg /kg -EEG without epileptiform discharges, suggestive of severe diffuse encephalopathy -neck is supple,    Acute Kidney Injury, hypokalemia Creatinine 2.13, baseline <1, suspect pre-renal volume depletion -follow renal indices and electrolytes -place foley and follow UOP   History of HTN -hold home medications   Rheumatoid Arthritis  -Hold Cellcept for now         Best practice:  Diet: Npo Pain/Anxiety/Delirium protocol (if indicated): n/a VAP protocol (if indicated): n/a DVT prophylaxis: heparin GI prophylaxis: n/a Glucose control: glucose gtt Mobility: bed rest Code Status: full code Family Communication: Pt's spouse updated by phone Disposition: ICU  Labs   CBC: Recent Labs  Lab 01/18/2020 2004 Jan 18, 2020 2059  WBC 10.7*   --   NEUTROABS 7.9*  --   HGB 18.9* 21.8*  HCT 67.2* 64.0*  MCV 104.0*  --   PLT 250  --     Basic Metabolic Panel: Recent Labs  Lab Jan 18, 2020 2004 January 18, 2020 2053 01-18-20 2059  NA 138  --  139  K 2.7*  --  2.4*  CL 79*  --   --   CO2 34*  --   --   GLUCOSE 1,186*  --   --   BUN 21*  --   --   CREATININE 2.13*  --   --   CALCIUM 10.2  --   --   MG  --  4.3*  --    GFR: Estimated Creatinine Clearance: 58.3 mL/min (A) (by C-G formula based on SCr of 2.13 mg/dL (H)). Recent Labs  Lab 01/18/20 2004 Jan 18, 2020 2115 01-18-2020 2313  WBC 10.7*  --   --   LATICACIDVEN  --  10.8* 10.1*    Liver Function Tests: Recent Labs  Lab 2020/01/18 2023  AST 76*  ALT 101*  ALKPHOS 369*  BILITOT 1.9*  PROT 8.3*  ALBUMIN 4.0   No results for input(s): LIPASE, AMYLASE in the last 168 hours. No results for input(s): AMMONIA in the last 168 hours.  ABG    Component Value  Date/Time   HCO3 44.3 (H) 01/10/2020 2059   TCO2 46 (H) 12/29/2019 2059   O2SAT 43.0 01/11/2020 2059     Coagulation Profile: No results for input(s): INR, PROTIME in the last 168 hours.  Cardiac Enzymes: No results for input(s): CKTOTAL, CKMB, CKMBINDEX, TROPONINI in the last 168 hours.  HbA1C: Hemoglobin A1C  Date/Time Value Ref Range Status  10/25/2019 08:58 AM 9.7 (A) 4.0 - 5.6 % Final  04/04/2019 11:07 AM 9.8 (A) 4.0 - 5.6 % Final   Hgb A1c MFr Bld  Date/Time Value Ref Range Status  09/05/2017 06:00 PM 9.6 (H) 4.8 - 5.6 % Final    Comment:    (NOTE) Pre diabetes:          5.7%-6.4% Diabetes:              >6.4% Glycemic control for   <7.0% adults with diabetes     CBG: Recent Labs  Lab 12/26/2019 1947 12/31/2019 2311 12/28/19 0056  GLUCAP >600* >600* >600*    Review of Systems:   Unable to obtain secondary to mental status  Past Medical History  She,  has a past medical history of Anxiety, Asthma, Chronic headache, Chronic lower back pain, Depression, Essential hypertension, Family history  of adverse reaction to anesthesia, GERD (gastroesophageal reflux disease), Manic state ([facility]), Migraine, Obesity, OSA on CPAP, Osteoarthritis, knee, Pneumonia, Prolonged QT syndrome, PTSD (post-traumatic stress disorder), Rheumatoid arthritis ([facility]), Seizures ([facility]), Severe needle phobia, and Type II diabetes mellitus ([facility]).   Surgical History    Past Surgical History:  Procedure Laterality Date  . TONSILLECTOMY  1994     Social History   reports that she has never smoked. She has never used smokeless tobacco. She reports previous alcohol use. She reports that she does not use drugs.   Family History   Her family history includes Anxiety disorder in her mother; Depression in her mother; Diabetes in her father and paternal uncle; Heart disease in her paternal grandfather; Hypertension in her maternal grandfather, maternal grandmother, paternal grandfather, and paternal grandmother; Hypothyroidism in her mother; Pancreatic cancer in her maternal grandfather.   Allergies Allergies  Allergen Reactions  . Bee Venom Anaphylaxis  . Cinnamon Anaphylaxis  . Doxycycline Hives  . Ibuprofen Other (See Comments)    Abdominal pain  . Lexapro [Escitalopram Oxalate] Other (See Comments)    Generalized body shaking. ? sz  . Latex Rash  . Tape Hives, Rash and Other (See Comments)    EKG STICKERS     Home Medications  Prior to Admission medications   Medication Sig Start Date End Date Taking? Authorizing Provider  albuterol (PROVENTIL HFA;VENTOLIN HFA) 108 (90 Base) MCG/ACT inhaler Inhale 2 puffs into the lungs every 6 (six) hours as needed for wheezing or shortness of breath (wheezing). For shortness of breath. 01/05/18   Marcine Matar, MD  albuterol (PROVENTIL) (2.5 MG/3ML) 0.083% nebulizer solution Take 3 mLs (2.5 mg total) by nebulization every 6 (six) hours as needed for wheezing or shortness of breath. 03/12/19   Parrett, Virgel Bouquet, NP  atenolol (TENORMIN) 25 MG tablet Take 0.5 tablets (12.5 mg  total) by mouth daily. 02/12/18   Marcine Matar, MD  BELBUCA 900 MCG FILM DISSOLVE 1 FILM INSIDE OF CHEEKS Q 12 HOURS 05/30/18   [provider]  Biotin w/ Vitamins C & E (HAIR/SKIN/NAILS PO) Take 1 tablet by mouth daily.    [provider]  Calcium Citrate 200 MG TABS Take 2 tablets (400 mg total)  by mouth every other day. Patient taking differently: Take 200 mg by mouth daily.  10/05/16   Newt Lukes, MD  cetirizine (ZYRTEC) 10 MG tablet TAKE 1 TABLET BY MOUTH EVERY NIGHT AT BEDTIME Patient taking differently: Take 10 mg by mouth at bedtime.  03/02/18   Marcine Matar, MD  Continuous Blood Gluc Sensor (DEXCOM G6 SENSOR) MISC 1 Device by Does not apply route daily as needed. Change every 10 days 04/04/19   Romero Belling, MD  Crisaborole (EUCRISA) 2 % OINT Apply topically.    [provider]  DULoxetine (CYMBALTA) 30 MG capsule TAKE 1 CAPSULE BY MOUTH TWICE DAILY Patient taking differently: Take 30 mg by mouth 2 (two) times daily.  03/05/18   Marcine Matar, MD  famotidine (PEPCID) 20 MG tablet TK 1 T PO BID 05/28/18   [provider]  furosemide (LASIX) 20 MG tablet TAKE 1 TABLET BY MOUTH EVERY OTHER DAY Patient taking differently: Take 20 mg by mouth every other day.  03/30/18   Marcine Matar, MD  gabapentin (NEURONTIN) 300 MG capsule TAKE 1 CAPSULE BY MOUTH EVERY MORNING, 1 CAPSULE BY MOUTH AT NOON, AND 2 CAPSULES BY MOUTH AT BEDTIME Patient taking differently: Take 300-600 mg by mouth See admin instructions. TAKE 1 CAPSULE BY MOUTH EVERY MORNING, 1 CAPSULE BY MOUTH AT NOON, AND 2 CAPSULES BY MOUTH AT BEDTIME 03/05/18   Marcine Matar, MD  glucose blood (ACCU-CHEK AVIVA PLUS) test strip UAD UP TO TID TO CHECK BLOOD SUGAR 03/06/18   Marcine Matar, MD  hydrochlorothiazide (HYDRODIURIL) 25 MG tablet Take 1 tablet (25 mg total) by mouth daily. 10/02/17   Marcine Matar, MD  hydrOXYzine (ATARAX/VISTARIL) 10 MG tablet TAKE 1 TABLET(10  MG) BY MOUTH THREE TIMES DAILY AS NEEDED 10/19/18   Marcine Matar, MD  insulin aspart (NOVOLOG FLEXPEN) 100 UNIT/ML FlexPen 3 times a day (just before each meal) 50-70-70 units, and pen needles 5/day 06/20/19   Romero Belling, MD  insulin glargine, 2 Unit Dial, (TOUJEO MAX SOLOSTAR) 300 UNIT/ML Solostar Pen Inject 350 Units into the skin every morning. 10/25/19   Romero Belling, MD  Insulin Pen Needle (BD PEN NEEDLE NANO 2ND GEN) 32G X 4 MM MISC 1 each by Does not apply route in the morning, at noon, in the evening, and at bedtime. E11.9 09/23/19   Romero Belling, MD  ipratropium-albuterol (DUONEB) 0.5-2.5 (3) MG/3ML SOLN Take 3 mLs by nebulization every 6 (six) hours as needed. Patient taking differently: Take 3 mLs by nebulization every 4 (four) hours as needed (for shortness of breath or wheezing).  07/28/17   Mannam, Colbert Coyer, MD  montelukast (SINGULAIR) 10 MG tablet TAKE 1 TABLET(10 MG) BY MOUTH DAILY 07/30/18   Marcine Matar, MD  Multiple Vitamins-Minerals (WOMENS MULTI) CAPS Take 1 capsule by mouth daily.    [provider]  mycophenolate (CELLCEPT) 500 MG tablet Take 500 mg by mouth 2 (two) times daily. 05/28/18   [provider]  nabumetone (RELAFEN) 500 MG tablet Take 1 tablet (500 mg total) by mouth 2 (two) times daily as needed. 06/08/18   Hilts, Casimiro Needle, MD  Omega-3 Fatty Acids (FISH OIL) 1000 MG CAPS Take 1 capsule by mouth daily.    [provider]  oxyCODONE-acetaminophen (PERCOCET/ROXICET) 5-325 MG tablet TK 1 OR 2 TS PO Q  6 H PRN P 05/29/18   [provider]  pantoprazole (PROTONIX) 40 MG tablet TAKE 1 TABLET(40 MG) BY MOUTH TWICE DAILY BEFORE  A MEAL 11/16/18   Sherrilyn Rist, MD  phentermine 15 MG capsule Take 15 mg by mouth every morning.    [provider]  promethazine (PHENERGAN) 25 MG tablet Take 1 tablet (25 mg total) by mouth every 8 (eight) hours as needed for nausea or vomiting. 12/08/17   Marcine Matar, MD  Respiratory  Therapy Supplies (FLUTTER) DEVI As directed. 07/28/17   Mannam, Colbert Coyer, MD  Semaglutide (OZEMPIC, 1 MG/DOSE, [place]) Inject 1 mg into the skin once a week.    [provider]  Spacer/Aero-Holding Chambers (AEROCHAMBER MV) inhaler Use as instructed 01/05/18   Marcine Matar, MD  tiZANidine (ZANAFLEX) 4 MG tablet TAKE 1 TABLET(4 MG) BY MOUTH EVERY 6 HOURS AS NEEDED FOR MUSCLE SPASMS 04/16/18   Marcine Matar, MD  Vitamin D, Ergocalciferol, (DRISDOL) 50000 units CAPS capsule Take 50,000 Units by mouth once a week. 03/26/18   [provider]  VYVANSE 50 MG capsule Take 50 mg by mouth daily. 05/17/18   [provider]     Critical care time: 65 minutes       CRITICAL CARE Performed by: Darcella Gasman Rindy Kollman   Total critical care time: 65 minutes  Critical care time was exclusive of separately billable procedures and treating other patients.  Critical care was necessary to treat or prevent imminent or life-threatening deterioration.  Critical care was time spent personally by me on the following activities: development of treatment plan with patient and/or surrogate as well as nursing, discussions with consultants, evaluation of patient's response to treatment, examination of patient, obtaining history from patient or surrogate, ordering and performing treatments and interventions, ordering and review of laboratory studies, ordering and review of radiographic studies, pulse oximetry and re-evaluation of patient's condition.   Darcella Gasman Nichalos Brenton, PA-C

## 2019-12-28 NOTE — Progress Notes (Signed)
CRITICAL VALUE ALERT  Critical Value:  Serum Osmolality 363  Date & Time Notied:  12/28/19 @ 2021  Provider Notified: Pola Corn MD  Orders Received/Actions taken: Md made aware.

## 2019-12-28 NOTE — Procedures (Signed)
Arterial Catheter Insertion Procedure Note MILYN STAPLETON [id number] August 20, 1985  Procedure: Insertion of Arterial Catheter  Indications: Blood pressure monitoring  Procedure Details Consent: Risks of procedure as well as the alternatives and risks of each were explained to the (patient/caregiver).  Consent for procedure obtained. Time Out: Verified patient identification, verified procedure, site/side was marked, verified correct patient position, special equipment/implants available, medications/allergies/relevent history reviewed, required imaging and test results available.  Performed  Maximum sterile technique was used including antiseptics, cap, gloves, gown, hand hygiene, mask and sheet. Skin prep: Chlorhexidine; local anesthetic administered 22 gauge catheter was inserted into left radial artery using the Seldinger technique. ULTRASOUND GUIDANCE USED: YES Evaluation Blood flow UnSuccessful;   22-gauge needle was advanced under direct visualization with ultrasound, catheter tip was located in the arterial lumen, rapidly rising blood flow was noted in the arterial catheter and guidewire was advanced with ease.  However unable to thread the catheter over guidewire.   [name] 12/28/2019

## 2019-12-28 NOTE — Progress Notes (Signed)
Oliguric  Will challenge with albumin and lasix  CVP 13 Has enough fluid on board.

## 2019-12-28 NOTE — Progress Notes (Addendum)
Pharmacy Antibiotic Note  Courtney Zamora is a 34 y.o. female admitted on 01-26-20 with seizure, DKA, and concern for sepsis and intra-abdominal infection.  Pharmacy has been consulted for vancomycin and Zosyn dosing.  AKI noted; baseline SCr <1, now 2.13.  Plan: Vancomycin 2500mg  IV x1; monitor SCr prior to redosing.  Goal trough 15-20 mcg/mL. Zosyn 3.375g IV Q8H (extended infusion).  Height: 5\' 4"  (162.6 cm) Weight: (!) 184.6 kg (407 lb) IBW/kg (Calculated) : 54.7  Temp (24hrs), Avg:99.8 F (37.7 C), Min:98.2 F (36.8 C), Max:101.4 F (38.6 C)  Recent Labs  Lab 01-26-2020 2004 01-26-20 2115 Jan 26, 2020 2313  WBC 10.7*  --   --   CREATININE 2.13*  --   --   LATICACIDVEN  --  10.8* 10.1*    Estimated Creatinine Clearance: 62.1 mL/min (A) (by C-G formula based on SCr of 2.13 mg/dL (H)).    Allergies  Allergen Reactions  . Bee Venom Anaphylaxis  . Cinnamon Anaphylaxis  . Doxycycline Hives  . Ibuprofen Other (See Comments)    Abdominal pain  . Lexapro [Escitalopram Oxalate] Other (See Comments)    Generalized body shaking. ? sz  . Latex Rash  . Tape Hives, Rash and Other (See Comments)    EKG STICKERS    Thank you for allowing pharmacy to be a part of this patient's care.  2116, PharmD, BCPS  12/28/2019 12:55 AM

## 2019-12-28 NOTE — Progress Notes (Signed)
Check on patient  Still febrile 103  Minimal urine output  Repeat 500 cc 0.45 saline bolus CVP monitoring Cooling blanket  Lipase markedly elevated at 1600 Fever source may be from pancreatitis, pneumonia  Continue antibiotics Follow cultures

## 2019-12-28 NOTE — Progress Notes (Signed)
STAT EEG complete - results pending LTM to follow. Same leads used 

## 2019-12-28 NOTE — Progress Notes (Signed)
Inpatient Diabetes Program Recommendations  AACE/ADA: New Consensus Statement on Inpatient Glycemic Control (2015)  Target Ranges:  Prepandial:   less than 140 mg/dL      Peak postprandial:   less than 180 mg/dL (1-2 hours)      Critically ill patients:  140 - 180 mg/dL   Lab Results  Component Value Date   GLUCAP 386 (H) 12/28/2019   HGBA1C 9.7 (A) 10/25/2019    Review of Glycemic Control  Diabetes history: DM 2, Sees Romero Belling, Endocrinology (last visit 10/25/19 Outpatient Diabetes medications: Toujeo 350 units Daily, Novolog 50 units breakfast, 70 units lunch, 70 units supper Current orders for Inpatient glycemic control: IV insulin gtt/Endotool  Glucose 1,186 on presentation A1c 9.7% on 10/25/19  Inpatient Diabetes Program Recommendations:    Pt on extremely high doses of insulin at home. Pt still requires IV insulin at this time.   Insulin gtt rate between 5.5-11 units/hour  At time of transition, recommend weight based dosing of basal bolus instead of reordering home regimen.  Lantus 50 units (0.3 units/kg) Novolog 0-15 units  May add Novolog meal coverage when eating  Will follow glucose trends and follow up with pt when appropriate.  Thanks,  Christena Deem RN, MSN, BC-ADM Inpatient Diabetes Coordinator Team Pager [phone] (8a-5p)

## 2019-12-28 NOTE — Progress Notes (Signed)
RT attemped ABG but unable to obtain at this time. Will try again.

## 2019-12-28 NOTE — Progress Notes (Signed)
Subjective: Was intubated because of somnolence this morning.   Exam: Vitals:   12/28/19 0731 12/28/19 0747  BP: (!) 91/51   Pulse: (!) 106   Resp: (!) 38   Temp:  (!) 102.5 F (39.2 C)  SpO2: 91%    Gen: In bed, intubated Resp: ventilated Abd: soft, nt  Neuro: MS: does not open eyes or follow commands.  FU:QXAFH, Eyes cross midline bilaterally.  Motor: moves all extremities to noxious simtulation.  Sensory:as above.   Pertinent Labs: NA 145 K 3.3 Cr 2.84  Impression: 35 yo F with left sided twitching and weakness in the setting of severe hyperglycemia. EEG briefly reviewed, not in status, full report to follow. With getting intubated, I would favor continued EEG monitoring for now, but will need MRI once possible.   Recommendations: 1) Continue keppra for now 2) Continue LTM EEG 3) Will continue to follow.   Ritta Slot, MD [facility] [phone]  If 7pm- 7am, please page neurology on call as listed in AMION.

## 2019-12-28 NOTE — Progress Notes (Addendum)
NAME:  Courtney Zamora, MRN:  [id number], DOB:  [birth year], LOS: 1 ADMISSION DATE:  01/04/2020, CONSULTATION DATE:  12/28/19 REFERRING MD:  EDP, CHIEF COMPLAINT:  seizures   Brief History   34 y.o. F with PMH of RA, Type 2 DM, possible seizures, HTN who presented with shaking of abnormal jerking movements thought to be seizures, AMS and hyperglycemia.  Pt loaded with Keppra, head CT negative, also developed fever and had a significant lactic acidosis.  Given AMS, PCCM consulted for admission.  History of present illness   Courtney Zamora is a  RA on cellcept, Type 2 DM, possible seizures (no prior EEG and no medications per husband), HTN who presented with shaking of abnormal jerking movements thought to be seizures, AMS and hyperglycemia.   He reports that about two days ago she was having trouble holding a cup because of weakness and was only eating fruit because everything tasted metallic.  He states her blood sugar was high, but it usually is.  She had some constipation, but did not complain of abdominal pain, diarrhea, nausea, vomiting or fever.  Earlier in the day, he noted her L arm shaking, he thought she was having a seizure, though this was different than her usual seizure.  These he describes as being diagnosed by a PCP and not on any medication.  She was sometimes scratch her face and pull her hair and then be fine immediately afterwards, she does not seek medical care after these episodes.  In the ED, pt was having continued twitching/jerking of the L side and was loaded with Keppra. CT head negative.  ED course significant for worsening mental status, oxygen requirement, lactic acid 10.8, K 2.4, Glucose >1100, anion gap 25, Co2 34.  Pt was able to intermittently answer questions and c/o abdominal pain.   She was given K and 3L IVF, she then spiked a fever to 101F.  Pt seen by neurology and EEG did not show evidence of seizures.   PCCM consulted for admission  Past Medical History    has a past medical history of Anxiety, Asthma, Chronic headache, Chronic lower back pain, Depression, Essential hypertension, Family history of adverse reaction to anesthesia, GERD (gastroesophageal reflux disease), Manic state (HCC), Migraine, Obesity, OSA on CPAP, Osteoarthritis, knee, Pneumonia, Prolonged QT syndrome, PTSD (post-traumatic stress disorder), Rheumatoid arthritis (HCC), Seizures (HCC), Severe needle phobia, and Type II diabetes mellitus (HCC).   Significant Hospital Events   6/11 Admit to PCCM   Consults:  neurology  Procedures:  6/12 endotracheal intubation 6/12 central line placement in the right IJ  Significant Diagnostic Tests:  6/11 CT head>> limited due to patient motion, no acute findings 6/11 CT abd/pelvis>> There is some mild fat stranding about the pancreas, which may represent acute pancreatitis. There is some mild wall thickening of the duodenum, which may be reactive or secondary to duodenitis. 6/12 chest x-ray following IJ and endotracheal tube-line in good placement, ET tube adequately placed OG tube adequately placed  Micro Data:  6/12 BCx2>> 6/12 UC>>  Antimicrobials:  Zosyn 6/12- Vancomycin 6/12-  Interim history/subjective:  Altered Respiratory rate in the 30s and 40s Not responsive to interactions Hypotension  Objective   Blood pressure (!) 91/51, pulse (!) 119, temperature (!) 102.5 F (39.2 C), temperature source Axillary, resp. rate (!) 27, height 5\' 4"  (1.626 m), weight (!) 168.2 kg, SpO2 98 %.    Vent Mode: PRVC FiO2 (%):  [100 %] 100 % Set Rate:  [16 bmp] 16  bmp Vt Set:  [430 mL] 430 mL PEEP:  [5 cmH20] 5 cmH20 Plateau Pressure:  [28 cmH20] 28 cmH20   Intake/Output Summary (Last 24 hours) at 12/28/2019 0917 Last data filed at 12/28/2019 7829 Gross per 24 hour  Intake 3279.3 ml  Output 75 ml  Net 3204.3 ml   Filed Weights   Jan 13, 2020 2341 12/28/19 0100  Weight: (!) 184.6 kg (!) 168.2 kg    General:  Obese F, diffuse  shaking, unresponsive HEENT: MM pink/moist, endotracheal tube in place Neuro: Not responsive, not following commands CV: S1-S2 appreciated PULM:  Course breath sounds bilaterally,  GI: soft, obese, diffuse TTP, bsx4 active  Extremities: warm/dry, 1+ edema   Resolved Hospital Problem list     Assessment & Plan:  34 y.o. F who presented with possible seizures and AMS,  found to have hyperglycemia, possible sepsis and pancreatitis   DKA/HHS Initial glucose >1100 with gap of 25, pH 7.4 -start insulin gtt per endotool, aggressively replete K -check Beta hydroxy butyrate  -q4hrs BMP -Hgb A1c -Continue to follow aggressively  Septic Shock Unclear source currently -has received 30cc/kg IVF -follow lactic acid -blood cultures-drawn -broad spectrum abx-vancomycin and Zosyn -On vasopressin and Levophed  Acute Hypoxic Respiratory Failure Possibly due to AMS, CXR with vascular congestion and cardiomegaly.  Last Echo in 2019 with preserved EF -Continue [place] supplementation to maintain sats >92% -check BNP and echocardogram -Echocardiogram still pending  Abdominal pain and possible pancreatitis Peri-pancreatic stranding on CT -check lipase, follow lactic acid -Follow UA and urine/blood cultures -Continue broad spectrum abx with Zosyn/Vancomycin  Acute encephalopathy and possible seizures CT head was negative, I am unsure whether patient's reported seizure activity at home represents seizure activity, she is not on medication and per husband, will shake, but also scratch her face and pull her hair and then return to her baseline -Seen by neurology, appreciate recs.  Load with Keppra 20mg /kg -EEG without epileptiform discharges, suggestive of severe diffuse encephalopathy -neck is supple,  -Appreciate neuro follow-up  Acute Kidney Injury, hypokalemia Creatinine 2.13, baseline <1, suspect pre-renal volume depletion -follow renal indices and electrolytes -place foley and follow  UOP  History of HTN -hold home medications  Rheumatoid Arthritis  -Hold Cellcept for now   Best practice:  Diet: Npo Pain/Anxiety/Delirium protocol (if indicated): n/a VAP protocol (if indicated): In place DVT prophylaxis: heparin GI prophylaxis: Protonix Glucose control: glucose gtt Mobility: bed rest Code Status: full code Family Communication: Pt's spouse updated by phone Disposition: ICU  Labs   CBC: Recent Labs  Lab 01-13-2020 2004 Jan 13, 2020 2059 12/28/19 0249  WBC 10.7*  --  14.3*  NEUTROABS 7.9*  --   --   HGB 18.9* 21.8* 17.0*  HCT 67.2* 64.0* 56.8*  MCV 104.0*  --  98.6  PLT 250  --  201    Basic Metabolic Panel: Recent Labs  Lab 01/13/20 2004 01-13-20 2053 01-13-2020 2059 12/28/19 0249 12/28/19 0621  NA 138  --  139 145 142  K 2.7*  --  2.4* 3.3* 3.2*  CL 79*  --   --  91* 104  CO2 34*  --   --  28 19*  GLUCOSE 1,186*  --   --  748* 329*  BUN 21*  --   --  27* 18  CREATININE 2.13*  --   --  2.84* 1.60*  CALCIUM 10.2  --   --  9.3 7.1*  MG  --  4.3*  --   --   --  GFR: Estimated Creatinine Clearance: 77.6 mL/min (A) (by C-G formula based on SCr of 1.6 mg/dL (H)). Recent Labs  Lab 01/05/2020 2004 12/20/2019 2115 12/24/2019 2313 12/28/19 0249 12/28/19 0621  WBC 10.7*  --   --  14.3*  --   LATICACIDVEN  --  10.8* 10.1*  --  >11.0*    Liver Function Tests: Recent Labs  Lab 01/09/2020 2023  AST 76*  ALT 101*  ALKPHOS 369*  BILITOT 1.9*  PROT 8.3*  ALBUMIN 4.0   Recent Labs  Lab 12/28/19 0249  LIPASE 1,652*   No results for input(s): AMMONIA in the last 168 hours.  ABG    Component Value Date/Time   HCO3 44.3 (H) 01/11/2020 2059   TCO2 46 (H) 12/20/2019 2059   O2SAT 43.0 01/09/2020 2059     Coagulation Profile: No results for input(s): INR, PROTIME in the last 168 hours.  Cardiac Enzymes: No results for input(s): CKTOTAL, CKMB, CKMBINDEX, TROPONINI in the last 168 hours.  HbA1C: Hemoglobin A1C  Date/Time Value Ref Range  Status  10/25/2019 08:58 AM 9.7 (A) 4.0 - 5.6 % Final  04/04/2019 11:07 AM 9.8 (A) 4.0 - 5.6 % Final   Hgb A1c MFr Bld  Date/Time Value Ref Range Status  09/05/2017 06:00 PM 9.6 (H) 4.8 - 5.6 % Final    Comment:    (NOTE) Pre diabetes:          5.7%-6.4% Diabetes:              >6.4% Glycemic control for   <7.0% adults with diabetes     CBG: Recent Labs  Lab 12/28/19 0537 12/28/19 0610 12/28/19 0651 12/28/19 0730 12/28/19 0808  GLUCAP >600* 557* 412* 438* 386*    Review of Systems:   Unable to obtain secondary to mental status  Past Medical History  She,  has a past medical history of Anxiety, Asthma, Chronic headache, Chronic lower back pain, Depression, Essential hypertension, Family history of adverse reaction to anesthesia, GERD (gastroesophageal reflux disease), Manic state ([place]), Migraine, Obesity, OSA on CPAP, Osteoarthritis, knee, Pneumonia, Prolonged QT syndrome, PTSD (post-traumatic stress disorder), Rheumatoid arthritis ([place]), Seizures ([place]), Severe needle phobia, and Type II diabetes mellitus ([place]).   Surgical History    Past Surgical History:  Procedure Laterality Date  . TONSILLECTOMY  1994     Social History   reports that she has never smoked. She has never used smokeless tobacco. She reports previous alcohol use. She reports that she does not use drugs.   Family History   Her family history includes Anxiety disorder in her mother; Depression in her mother; Diabetes in her father and paternal uncle; Heart disease in her paternal grandfather; Hypertension in her maternal grandfather, maternal grandmother, paternal grandfather, and paternal grandmother; Hypothyroidism in her mother; Pancreatic cancer in her maternal grandfather.    The patient is critically ill with multiple organ systems failure and requires high complexity decision making for assessment and support, frequent evaluation and titration of therapies, application of advanced monitoring  technologies and extensive interpretation of multiple databases. Critical Care Time devoted to patient care services described in this note independent of APP/resident time (if applicable)  is 60 minutes.   Sherrilyn Rist MD [facility] Personal pager: [phone] If unanswered, please page CCM On-call: [phone]

## 2019-12-28 NOTE — Progress Notes (Signed)
eLink Physician-Brief Progress Note Patient Name: Courtney Zamora DOB: 15-Nov-1985 MRN: [id number]   Date of Service  12/28/2019  HPI/Events of Note  Shock, profound metabolic acidosis, hyperglycemia, hypertriglyceridemia, Osmolal gap of 20.  eICU Interventions  Pt treated with insulin infusion, aggressive volume resuscitation, arterial line and central line, vasopressors, stress dose steroids, and empiric Fomepizole for possible volatile acid (Ethylene Glycol) poisoning.        Thomasene Lot Jovany Disano 12/28/2019, 3:46 AM

## 2019-12-28 NOTE — Progress Notes (Signed)
Renal ultrasound ordered  Repeat labs in 3 hours  Venous blood gas  Made renal aware of patient, to see in a.m.

## 2019-12-28 NOTE — Procedures (Signed)
Intubation Procedure Note Courtney Zamora [id number] Apr 27, 1986  Procedure: Intubation Indications: acute hypoxemic respiratory failure  Received 100 mcg of fentanyl, 2 mg of Versed, 10 mg of etomidate, 40 mg of rocuronium  Procedure Details Consent: Unable to obtain consent because of altered level of consciousness. Time Out: Verified patient identification, verified procedure, site/side was marked, verified correct patient position, special equipment/implants available, medications/allergies/relevent history reviewed, required imaging and test results available.  Performed  Maximum sterile technique was used including antiseptics, cap, gloves, gown, hand hygiene, mask and sheet.  4  Evaluation Hemodynamic Status: BP stable throughout; O2 sats: stable throughout Patient's Current Condition: stable Complications: No apparent complications Patient did tolerate procedure well. Chest X-ray ordered to verify placement.  CXR: pending.   Courtney Zamora Courtney Zamora 12/28/2019

## 2019-12-28 NOTE — Procedures (Signed)
Echo attempted. EEG running with lead attached in parasternal imaging location. Will attempt again later once lead is removed.

## 2019-12-28 NOTE — Consult Note (Addendum)
On the requesting Physician: Dr. Maryan Zamora    Chief Complaint: Left side twitching  History obtained from: Patient and Chart    HPI:                                                                                                                                       Courtney Zamora is a 34 y.o. female with past medical history significant for rheumatoid arthritis, type 2 diabetes mellitus hypertension presents to the emergency department with altered mental status, abnormal jerking movements witnessed by her husband as well as progressive weakness over the last 2 days.  History obtained by chart review and EDP. On arrival patient was noted to have twitching jerking of the left upper and lower extremities.  ED course: Stat CT head was negative for acute findings. Glucose was greater than 1100. Patient also had a fever of 101. Neurology was consulted for concern for seizures.  On assessment, patient was lethargic but answers her name. Left side weaker than the right side, bilateral upper extremity tremors with occasional twitching movements in the left upper extremity.  Patient loaded with Keppra 1.5 g and stat EEG was obtained: Difficult to interpret due to significant muscle artifact but no obvious seizures. Patient placed on continuous EEG   Past Medical History:  Diagnosis Date  . Anxiety   . Asthma   . Chronic headache    "1-2/week" (04/09/2018)  . Chronic lower back pain   . Depression   . Essential hypertension   . Family history of adverse reaction to anesthesia    "mom would get PONV" (04/09/2018)  . GERD (gastroesophageal reflux disease)   . Manic state ([place])   . Migraine    "a few/year" (04/09/2018)  . Obesity   . OSA on CPAP   . Osteoarthritis, knee   . Pneumonia    "this is my 2nd time" (04/09/2018)  . Prolonged QT syndrome   . PTSD (post-traumatic stress disorder)   . Rheumatoid arthritis ([place])   . Seizures ([place])    "stress induced; 1 in this past year" (04/09/2018)   . Severe needle phobia   . Type II diabetes mellitus ([place])     Past Surgical History:  Procedure Laterality Date  . TONSILLECTOMY  1994    Family History  Problem Relation Age of Onset  . Hypothyroidism Mother   . Depression Mother   . Anxiety disorder Mother   . Diabetes Father   . Diabetes Paternal Uncle   . Hypertension Maternal Grandmother   . Hypertension Maternal Grandfather   . Pancreatic cancer Maternal Grandfather   . Hypertension Paternal Grandmother   . Heart disease Paternal Grandfather   . Hypertension Paternal Grandfather    Social History:  reports that she has never smoked. She has never used smokeless tobacco. She reports previous alcohol use. She reports that she does not use drugs.  Allergies:  Allergies  Allergen Reactions  . Bee Venom Anaphylaxis  . Cinnamon Anaphylaxis  . Doxycycline Hives  . Ibuprofen Other (See Comments)    Abdominal pain  . Lexapro [Escitalopram Oxalate] Other (See Comments)    Generalized body shaking. ? sz  . Latex Rash  . Tape Hives, Rash and Other (See Comments)    EKG STICKERS    Medications:                                                                                                                        I reviewed home medications   ROS:                                                                                                                                     14 systems reviewed and negative except above    Examination:                                                                                                      General: Appears well-developed and well-nourished.  Psych: Lethargic Eyes: No scleral injection HENT: No OP obstrucion Head: Normocephalic.  Cardiovascular: Normal rate and regular rhythm.  Respiratory: Effort normal and breath sounds normal to anterior ascultation GI: Soft.  No distension. There is no tenderness.  Skin: WDI    Neurological Examination Mental  Status: Lethargic, answers her name but does not answer any other questions. Intermittently follows some commands. Cranial Nerves: II: Visual fields : Difficult to assess III,IV, VI: ptosis not present, no gaze deviation, pupils equal, round, reactive to light and accommodation-bilaterally large at 7 mm V,VII: smile symmetric XII: midline tongue extension Motor: Withdraws with at least 3/5 strength in right upper and lower extremity, appears flaccid in the left upper and lower extremity Tone and bulk:normal tone throughout; no atrophy noted Sensory: Pinprick and light touch intact throughout, bilaterally Plantars: Right: downgoing   Left: downgoing Cerebellar: Unable to assess      Lab Results: Basic Metabolic Panel:  Recent Labs  Lab 01/04/2020 2004 01/03/2020 2053 01/06/2020 2059  NA 138  --  139  K 2.7*  --  2.4*  CL 79*  --   --   CO2 34*  --   --   GLUCOSE 1,186*  --   --   BUN 21*  --   --   CREATININE 2.13*  --   --   CALCIUM 10.2  --   --   MG  --  4.3*  --     CBC: Recent Labs  Lab 12/18/2019 2004 01/05/2020 2059  WBC 10.7*  --   NEUTROABS 7.9*  --   HGB 18.9* 21.8*  HCT 67.2* 64.0*  MCV 104.0*  --   PLT 250  --     Coagulation Studies: No results for input(s): LABPROT, INR in the last 72 hours.  Imaging: EEG  Result Date: 12/28/2019 Courtney Quest, MD     12/28/2019 12:16 AM Patient Name: Courtney Zamora MRN: [id number] Epilepsy Attending: Charlsie Zamora Referring Physician/Provider: Dr Courtney Zamora Courtney Zamora Date: 12/21/2019 Duration: 23.30 mins Patient history: 34 year old female with a history of morbid obesity, RA, seizures not on any medications, poorly controlled diabetes who presented with left upper extremity abnormal jerking and ams. EEG to evaluate for seizure Level of alertness:  lethargic AEDs during EEG study: LEV Technical aspects: This EEG study was done with scalp electrodes positioned according to the 10-20 International system of electrode placement.  Electrical activity was acquired at a sampling rate of 500Hz  and reviewed with a high frequency filter of 70Hz  and a low frequency filter of 1Hz . EEG data were recorded continuously and digitally stored. Description: EEG showed continuous generalized 3 to 6 Hz theta-delta slowing.  Hyperventilation and photic stimulation were not performed.   Of note, eeg was technically difficult due to significant ekg artifact, movement artifact as well as electrode artifact. ABNORMALITY -Continuous slow, generalized IMPRESSION: This technically difficult study is suggestive of severe diffuse encephalopathy, nonspecific etiology. No seizures or epileptiform discharges were seen throughout the recording. Priyanka   CT ABDOMEN PELVIS WO CONTRAST  Result Date: 12/28/2019 CLINICAL DATA:  Abdominal pain EXAM: CT ABDOMEN AND PELVIS WITHOUT CONTRAST TECHNIQUE: Multidetector CT imaging of the abdomen and pelvis was performed following the standard protocol without IV contrast. COMPARISON:  February 15, 2019 FINDINGS: Lower chest: The lung bases are clear. The heart size is normal. Hepatobiliary: There is decreased hepatic attenuation suggestive of hepatic steatosis. The gallbladder is hyperdense which may be secondary to the presence of gallbladder sludge.There is no biliary ductal dilation. Pancreas: There is some mild fat stranding about the pancreas. Spleen: Unremarkable. Adrenals/Urinary Tract: --Adrenal glands: Unremarkable. --Right kidney/ureter: No hydronephrosis or radiopaque kidney stones. --Left kidney/ureter: No hydronephrosis or radiopaque kidney stones. --Urinary bladder: Unremarkable. Stomach/Bowel: --Stomach/Duodenum: The stomach is not significantly distended. There is some mild wall thickening of the duodenum. --Small bowel: Unremarkable. --Colon: Unremarkable. --Appendix: Normal. Vascular/Lymphatic: Normal course and caliber of the major abdominal vessels. --No retroperitoneal lymphadenopathy. --No mesenteric  lymphadenopathy. --No pelvic or inguinal lymphadenopathy. Reproductive: Unremarkable Other: No ascites or free air. The abdominal wall is normal. Musculoskeletal. No acute displaced fractures. IMPRESSION: 1. There is some mild fat stranding about the pancreas, which may represent acute pancreatitis. Correlation with lipase is recommended. 2. There is some mild wall thickening of the duodenum, which may be reactive or secondary to duodenitis. 3. Hepatic steatosis. Electronically Signed   By: Annabelle Harman M.D.   On: 12/28/2019 00:58  CT HEAD WO CONTRAST  Result Date: 12/21/2019 CLINICAL DATA:  Seizure, nontraumatic (Age 26-40y). Persistent twitching. EXAM: CT HEAD WITHOUT CONTRAST TECHNIQUE: Contiguous axial images were obtained from the base of the skull through the vertex without intravenous contrast. COMPARISON:  Most recent head CT 11/25/2017 FINDINGS: Brain: Technically limited evaluation due to patient twitching and soft tissue attenuation from habitus. Allowing for these limitations, no evidence of hemorrhage, hydrocephalus, or acute ischemia. No midline shift. Vascular: Cannot assess for hyperdense vessel given motion. Skull: No fracture or focal lesion. Sinuses/Orbits: No obvious acute finding. Other: None. IMPRESSION: Technically limited evaluation due to patient twitching and soft tissue attenuation from habitus. Allowing for these limitations, no evidence of acute abnormality. Consider follow-up exam when there is the possibility of decreased patient motion based on clinical concern. Electronically Signed   By: Narda Rutherford M.D.   On: 12/26/2019 21:29   DG Chest Port 1 View  Result Date: 01/14/2020 CLINICAL DATA:  Cough EXAM: PORTABLE CHEST 1 VIEW COMPARISON:  01/29/2018 FINDINGS: Low lung volumes. Cardiomegaly with vascular congestion. No confluent opacities or effusions. No acute bony abnormality. IMPRESSION: Low lung volumes.  Cardiomegaly with vascular congestion. Electronically  Signed   By: Charlett Nose M.D.   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: EEG negative for seizures after Keppra load Toxic metabolic encephalopathy   Recommendations Hold Neurontin Continuous EEG monitoring to capture subclinical seizures Continue Keppra 750 mg twice daily Seizure precautions Frequent neurochecks MRI brain once EEG leads off Correct hyperglycemia, metabolic abnormalities and sepsis Avoid cefepime if possible No neck stiffness, will defer LP for now but may reconsider if no clear source for infection    CRITICAL CARE Performed by: Dara Lords Noelene Gang  Total critical care time: 45 minutes  Critical care time was exclusive of separately billable procedures and treating other patients.  Critical care was necessary to treat or prevent imminent or life-threatening deterioration.  Critical care was time spent personally by me on the following activities: development of treatment plan with patient and/or surrogate as well as nursing, discussions with consultants, evaluation of patient's response to treatment, examination of patient, obtaining history from patient or surrogate, ordering and performing treatments and interventions, ordering and review of laboratory studies, ordering and review of radiographic studies, pulse oximetry and re-evaluation of patient's condition.   Rubina Basinski [facility] Pager Number [phone]

## 2019-12-28 NOTE — Procedures (Addendum)
Patient Name: Courtney Zamora  MRN: [id number]  Epilepsy Attending: Charlsie Quest  Referring Physician/Provider: Dr Georgiana Spinner Aroor Duration: 12/28/2019 0019 to 12/28/2019 6580, 12/28/2019 1201 to 12/29/2019 0400  Patient history: 34 year old female with a history of morbid obesity, RA, seizures not on any medications, poorly controlled diabetes who presented with left upper extremity abnormal jerking and ams. EEG to evaluate for seizure  Level of alertness:  lethargic  AEDs during EEG study: LEV  Technical aspects: This EEG study was done with scalp electrodes positioned according to the 10-20 International system of electrode placement. Electrical activity was acquired at a sampling rate of 500Hz  and reviewed with a high frequency filter of 70Hz  and a low frequency filter of 1Hz . EEG data were recorded continuously and digitally stored.   Description: EEG showed continuous generalized 3 to 6 Hz theta-delta slowing.  Hyperventilation and photic stimulation were not performed.     Of note, eeg was technically difficult due to significant ekg artifact, movement artifact as well as electrode artifact.  ABNORMALITY -Continuous slow, generalized  IMPRESSION: This technically difficult study is suggestive of severe diffuse encephalopathy, nonspecific etiology. No seizures or epileptiform discharges were seen throughout the recording.   Naviah Belfield 

## 2019-12-28 NOTE — Progress Notes (Signed)
3 RT's attempted to place a radial A-line with no success,  MD notified.

## 2019-12-28 NOTE — Progress Notes (Signed)
LTM EEG hooked up and running - no initial skin breakdown - push button tested - neuro notified.  Same leads used.  

## 2019-12-28 NOTE — Procedures (Signed)
Arterial Catheter Insertion Procedure Note Courtney Zamora [id number] January 10, 1986  Procedure: Insertion of Arterial Catheter  Indications: Blood pressure monitoring  Procedure Details Consent: Risks of procedure as well as the alternatives and risks of each were explained to the (patient/caregiver).  Consent for procedure obtained. Time Out: Verified patient identification, verified procedure, site/side was marked, verified correct patient position, special equipment/implants available, medications/allergies/relevent history reviewed, required imaging and test results available.  Performed  Maximum sterile technique was used including antiseptics, cap, gloves, gown, hand hygiene, mask and sheet. Skin prep: Chlorhexidine; local anesthetic administered 22 gauge catheter was inserted into right radial artery using the Seldinger technique-was unable to thread guidewire. ULTRASOUND GUIDANCE USED: YES   Unsuccessful attempt at placing right radial arterial line Evaluation Blood flow Unsuccessful; BP tracing Unsuccessful. Complications: No apparent complications.   Courtney Zamora Courtney Zamora 12/28/2019

## 2019-12-29 ENCOUNTER — Inpatient Hospital Stay (HOSPITAL_COMMUNITY): Payer: Medicaid Other

## 2019-12-29 DIAGNOSIS — R9431 Abnormal electrocardiogram [ECG] [EKG]: Secondary | ICD-10-CM

## 2019-12-29 LAB — GLUCOSE, CAPILLARY
Glucose-Capillary: 154 mg/dL — ABNORMAL HIGH (ref 70–99)
Glucose-Capillary: 169 mg/dL — ABNORMAL HIGH (ref 70–99)
Glucose-Capillary: 176 mg/dL — ABNORMAL HIGH (ref 70–99)
Glucose-Capillary: 177 mg/dL — ABNORMAL HIGH (ref 70–99)
Glucose-Capillary: 187 mg/dL — ABNORMAL HIGH (ref 70–99)
Glucose-Capillary: 190 mg/dL — ABNORMAL HIGH (ref 70–99)
Glucose-Capillary: 191 mg/dL — ABNORMAL HIGH (ref 70–99)
Glucose-Capillary: 197 mg/dL — ABNORMAL HIGH (ref 70–99)
Glucose-Capillary: 200 mg/dL — ABNORMAL HIGH (ref 70–99)
Glucose-Capillary: 202 mg/dL — ABNORMAL HIGH (ref 70–99)
Glucose-Capillary: 218 mg/dL — ABNORMAL HIGH (ref 70–99)
Glucose-Capillary: 233 mg/dL — ABNORMAL HIGH (ref 70–99)
Glucose-Capillary: 236 mg/dL — ABNORMAL HIGH (ref 70–99)
Glucose-Capillary: 243 mg/dL — ABNORMAL HIGH (ref 70–99)
Glucose-Capillary: 251 mg/dL — ABNORMAL HIGH (ref 70–99)
Glucose-Capillary: 258 mg/dL — ABNORMAL HIGH (ref 70–99)
Glucose-Capillary: 277 mg/dL — ABNORMAL HIGH (ref 70–99)
Glucose-Capillary: 284 mg/dL — ABNORMAL HIGH (ref 70–99)
Glucose-Capillary: 297 mg/dL — ABNORMAL HIGH (ref 70–99)

## 2019-12-29 LAB — BASIC METABOLIC PANEL
Anion gap: 19 — ABNORMAL HIGH (ref 5–15)
Anion gap: 23 — ABNORMAL HIGH (ref 5–15)
Anion gap: 22 — ABNORMAL HIGH (ref 5–15)
BUN: 18 mg/dL (ref 6–20)
BUN: 37 mg/dL — ABNORMAL HIGH (ref 6–20)
BUN: 41 mg/dL — ABNORMAL HIGH (ref 6–20)
CO2: 19 mmol/L — ABNORMAL LOW (ref 22–32)
CO2: 24 mmol/L (ref 22–32)
CO2: 20 mmol/L — ABNORMAL LOW (ref 22–32)
Calcium: 7.1 mg/dL — ABNORMAL LOW (ref 8.9–10.3)
Calcium: 7 mg/dL — ABNORMAL LOW (ref 8.9–10.3)
Calcium: 6.4 mg/dL — CL (ref 8.9–10.3)
Chloride: 104 mmol/L (ref 98–111)
Chloride: 101 mmol/L (ref 98–111)
Chloride: 102 mmol/L (ref 98–111)
Creatinine, Ser: 1.6 mg/dL — ABNORMAL HIGH (ref 0.44–1.00)
Creatinine, Ser: 5.74 mg/dL — ABNORMAL HIGH (ref 0.44–1.00)
Creatinine, Ser: 5.82 mg/dL — ABNORMAL HIGH (ref 0.44–1.00)
GFR calc Af Amer: 48 mL/min — ABNORMAL LOW (ref >60)
GFR calc Af Amer: 10 mL/min — ABNORMAL LOW (ref >60)
GFR calc Af Amer: 10 mL/min — ABNORMAL LOW (ref >60)
GFR calc non Af Amer: 41 mL/min — ABNORMAL LOW (ref >60)
GFR calc non Af Amer: 9 mL/min — ABNORMAL LOW (ref >60)
GFR calc non Af Amer: 9 mL/min — ABNORMAL LOW (ref >60)
Glucose, Bld: 329 mg/dL — ABNORMAL HIGH (ref 70–99)
Glucose, Bld: 204 mg/dL — ABNORMAL HIGH (ref 70–99)
Glucose, Bld: 234 mg/dL — ABNORMAL HIGH (ref 70–99)
Potassium: 3.2 mmol/L — ABNORMAL LOW (ref 3.5–5.1)
Potassium: 2.8 mmol/L — ABNORMAL LOW (ref 3.5–5.1)
Potassium: 3.2 mmol/L — ABNORMAL LOW (ref 3.5–5.1)
Sodium: 142 mmol/L (ref 135–145)
Sodium: 148 mmol/L — ABNORMAL HIGH (ref 135–145)
Sodium: 144 mmol/L (ref 135–145)

## 2019-12-29 LAB — RENAL FUNCTION PANEL
Albumin: 2.2 g/dL — ABNORMAL LOW (ref 3.5–5.0)
Anion gap: 21 — ABNORMAL HIGH (ref 5–15)
BUN: 41 mg/dL — ABNORMAL HIGH (ref 6–20)
CO2: 20 mmol/L — ABNORMAL LOW (ref 22–32)
Calcium: 6.5 mg/dL — ABNORMAL LOW (ref 8.9–10.3)
Chloride: 104 mmol/L (ref 98–111)
Creatinine, Ser: 5.87 mg/dL — ABNORMAL HIGH (ref 0.44–1.00)
GFR calc Af Amer: 10 mL/min — ABNORMAL LOW (ref >60)
GFR calc non Af Amer: 9 mL/min — ABNORMAL LOW (ref >60)
Glucose, Bld: 235 mg/dL — ABNORMAL HIGH (ref 70–99)
Phosphorus: 3.3 mg/dL (ref 2.5–4.6)
Potassium: 3.2 mmol/L — ABNORMAL LOW (ref 3.5–5.1)
Sodium: 145 mmol/L (ref 135–145)

## 2019-12-29 LAB — ECHOCARDIOGRAM COMPLETE
Height: 64 in
Weight: 5933.02 [oz_av]

## 2019-12-29 LAB — CBC WITH DIFFERENTIAL/PLATELET
Abs Immature Granulocytes: 0.07 10*3/uL (ref 0.00–0.07)
Basophils Absolute: 0 10*3/uL (ref 0.0–0.1)
Basophils Relative: 0 %
Eosinophils Absolute: 0.1 10*3/uL (ref 0.0–0.5)
Eosinophils Relative: 1 %
HCT: 56.4 % — ABNORMAL HIGH (ref 36.0–46.0)
Hemoglobin: 15.9 g/dL — ABNORMAL HIGH (ref 12.0–15.0)
Immature Granulocytes: 1 %
Lymphocytes Relative: 29 %
Lymphs Abs: 2.7 10*3/uL (ref 0.7–4.0)
MCH: 29.2 pg (ref 26.0–34.0)
MCHC: 28.2 g/dL — ABNORMAL LOW (ref 30.0–36.0)
MCV: 103.5 fL — ABNORMAL HIGH (ref 80.0–100.0)
Monocytes Absolute: 0.2 10*3/uL (ref 0.1–1.0)
Monocytes Relative: 3 %
Neutro Abs: 6.2 10*3/uL (ref 1.7–7.7)
Neutrophils Relative %: 66 %
Platelets: 113 10*3/uL — ABNORMAL LOW (ref 150–400)
RBC: 5.45 MIL/uL — ABNORMAL HIGH (ref 3.87–5.11)
RDW: 15.3 % (ref 11.5–15.5)
WBC: 9.3 10*3/uL (ref 4.0–10.5)
nRBC: 4.8 % — ABNORMAL HIGH (ref 0.0–0.2)

## 2019-12-29 LAB — HEPATIC FUNCTION PANEL
ALT: 1153 U/L — ABNORMAL HIGH (ref 0–44)
AST: 6313 U/L — ABNORMAL HIGH (ref 15–41)
Albumin: 2.6 g/dL — ABNORMAL LOW (ref 3.5–5.0)
Alkaline Phosphatase: 158 U/L — ABNORMAL HIGH (ref 38–126)
Bilirubin, Direct: 1.1 mg/dL — ABNORMAL HIGH (ref 0.0–0.2)
Indirect Bilirubin: 1.3 mg/dL — ABNORMAL HIGH (ref 0.3–0.9)
Total Bilirubin: 2.4 mg/dL — ABNORMAL HIGH (ref 0.3–1.2)
Total Protein: 5.4 g/dL — ABNORMAL LOW (ref 6.5–8.1)

## 2019-12-29 LAB — POTASSIUM: Potassium: 2.8 mmol/L — ABNORMAL LOW (ref 3.5–5.1)

## 2019-12-29 LAB — AMMONIA: Ammonia: 91 umol/L — ABNORMAL HIGH (ref 9–35)

## 2019-12-29 LAB — LACTIC ACID, PLASMA: Lactic Acid, Venous: 6.9 mmol/L (ref 0.5–1.9)

## 2019-12-29 LAB — URINE CULTURE: Culture: NO GROWTH

## 2019-12-29 LAB — VANCOMYCIN, RANDOM: Vancomycin Rm: 33

## 2019-12-29 LAB — MAGNESIUM: Magnesium: 2.1 mg/dL (ref 1.7–2.4)

## 2019-12-29 MED ORDER — PERFLUTREN LIPID MICROSPHERE
1.0000 mL | INTRAVENOUS | Status: AC | PRN
  Administered 2019-12-29: 3 mL via INTRAVENOUS
  Filled 2019-12-29: qty 10

## 2019-12-29 MED ORDER — ALBUMIN HUMAN 25 % IV SOLN
25.0000 g | Freq: Four times a day (QID) | INTRAVENOUS | Status: AC
  Administered 2019-12-29 – 2019-12-31 (×8): 25 g via INTRAVENOUS
  Filled 2019-12-29 (×8): qty 100

## 2019-12-29 MED ORDER — EPINEPHRINE (ANAPHYLAXIS) 30 MG/30ML IJ SOLN
0.5000 ug/min | INTRAVENOUS | Status: DC
  Administered 2019-12-29: 8 ug/min via INTRAVENOUS
  Administered 2019-12-29: 20 ug/min via INTRAVENOUS
  Administered 2019-12-30: 18 ug/min via INTRAVENOUS
  Filled 2019-12-29 (×5): qty 8

## 2019-12-29 MED ORDER — HYDROCORTISONE NA SUCCINATE PF 100 MG IJ SOLR
50.0000 mg | Freq: Four times a day (QID) | INTRAMUSCULAR | Status: DC
  Administered 2019-12-29 – 2019-12-30 (×6): 50 mg via INTRAVENOUS
  Filled 2019-12-29 (×6): qty 2

## 2019-12-29 MED ORDER — LACTATED RINGERS IV BOLUS
1000.0000 mL | Freq: Once | INTRAVENOUS | Status: AC
  Administered 2019-12-29: 1000 mL via INTRAVENOUS

## 2019-12-29 MED ORDER — CALCIUM GLUCONATE-NACL 1-0.675 GM/50ML-% IV SOLN
1.0000 g | Freq: Once | INTRAVENOUS | Status: AC
  Administered 2019-12-29: 1000 mg via INTRAVENOUS
  Filled 2019-12-29: qty 50

## 2019-12-29 MED ORDER — CALCIUM GLUCONATE-NACL 2-0.675 GM/100ML-% IV SOLN
2.0000 g | Freq: Once | INTRAVENOUS | Status: AC
  Administered 2019-12-29: 2000 mg via INTRAVENOUS
  Filled 2019-12-29: qty 100

## 2019-12-29 MED ORDER — HEPARIN SODIUM (PORCINE) 1000 UNIT/ML DIALYSIS
1000.0000 [IU] | INTRAMUSCULAR | Status: DC | PRN
  Administered 2019-12-29: 3000 [IU] via INTRAVENOUS_CENTRAL
  Administered 2020-01-01: 2400 [IU] via INTRAVENOUS_CENTRAL
  Filled 2019-12-29 (×4): qty 6
  Filled 2019-12-29: qty 3

## 2019-12-29 MED ORDER — LACTATED RINGERS IV BOLUS
500.0000 mL | Freq: Once | INTRAVENOUS | Status: AC
  Administered 2019-12-29: 500 mL via INTRAVENOUS

## 2019-12-29 MED ORDER — METHYLPREDNISOLONE SODIUM SUCC 125 MG IJ SOLR: 50.0000 mg | Freq: Four times a day (QID) | INTRAMUSCULAR | Status: DC

## 2019-12-29 MED ORDER — EPINEPHRINE HCL 5 MG/250ML IV SOLN IN NS
INTRAVENOUS | Status: AC
  Administered 2019-12-29: 5 ug/min
  Administered 2019-12-29: 7 ug/min
  Administered 2019-12-29: 0.5 ug/min
  Administered 2019-12-29: 8 ug/min
  Administered 2019-12-29: 3 ug/min
  Administered 2019-12-29: 1.5 ug/min
  Administered 2019-12-29: 4 ug/min
  Administered 2019-12-29: 4 mg
  Administered 2019-12-29: 6 ug/min
  Filled 2019-12-29: qty 250

## 2019-12-29 MED ORDER — PIPERACILLIN-TAZOBACTAM 3.375 G IVPB 30 MIN
3.3750 g | Freq: Four times a day (QID) | INTRAVENOUS | Status: DC
  Administered 2019-12-29 – 2020-01-03 (×20): 3.375 g via INTRAVENOUS
  Filled 2019-12-29 (×21): qty 50

## 2019-12-29 MED ORDER — THIAMINE HCL 100 MG/ML IJ SOLN
500.0000 mg | Freq: Three times a day (TID) | INTRAVENOUS | Status: AC
  Administered 2019-12-29 – 2019-12-30 (×6): 500 mg via INTRAVENOUS
  Filled 2019-12-29 (×7): qty 5

## 2019-12-29 MED ORDER — POTASSIUM CHLORIDE 10 MEQ/50ML IV SOLN
10.0000 meq | INTRAVENOUS | Status: AC
  Administered 2019-12-29 (×4): 10 meq via INTRAVENOUS
  Filled 2019-12-29 (×4): qty 50

## 2019-12-29 MED ORDER — EPINEPHRINE PF 1 MG/ML IJ SOLN: 0.5000 ug/min | INTRAVENOUS | Status: DC

## 2019-12-29 MED ORDER — HEPARIN (PORCINE) IN NACL 2-0.9 UNITS/ML: INTRAMUSCULAR | Status: DC | PRN

## 2019-12-29 MED ORDER — PRISMASOL BGK 4/2.5 32-4-2.5 MEQ/L REPLACEMENT SOLN
Status: DC
  Filled 2019-12-29 (×19): qty 5000

## 2019-12-29 MED ORDER — PRISMASOL BGK 4/2.5 32-4-2.5 MEQ/L REPLACEMENT SOLN
Status: DC
  Filled 2019-12-29 (×11): qty 5000

## 2019-12-29 MED ORDER — PRISMASOL BGK 4/2.5 32-4-2.5 MEQ/L IV SOLN
INTRAVENOUS | Status: DC
  Filled 2019-12-29 (×57): qty 5000

## 2019-12-29 NOTE — Progress Notes (Signed)
eLink Physician-Brief Progress Note Patient Name: Courtney Zamora DOB: 04-14-1986 MRN: [id number]   Date of Service  12/29/2019  HPI/Events of Note  MAP improved to 64 mmHg currently. This is on levo 40 mcg/min, vaso, and epi at 8 mcg/min.  We are now getting more consistent NIBP measurements, presumably because her MAP is actually higher after starting epi as above and giving the 1L LR bolus.  Ground team unable to place A-line despite repeat attempt.   eICU Interventions  Give additional 500cc LR bolus. Will assess BP response / ability to wean pressors following this intervention.      Intervention Category Major Interventions: Hypotension - evaluation and management  Marveen Reeks Angeliah Wisdom 12/29/2019, 3:11 AM

## 2019-12-29 NOTE — Procedures (Signed)
Hemodialysis Catheter Insertion Procedure Note LASHALA LASER [id number] Oct 30, 1985  Procedure: Insertion of Hemodialysis Catheter Indications: Dialysis Access   Procedure Details Consent: Risks of procedure as well as the alternatives and risks of each were explained to the (patient/caregiver).  Consent for procedure obtained. Time Out: Verified patient identification, verified procedure, site/side was marked, verified correct patient position, special equipment/implants available, medications/allergies/relevent history reviewed, required imaging and test results available.  Performed  Maximum sterile technique was used including antiseptics, cap, gloves, gown, hand hygiene, mask and sheet. Skin prep: Chlorhexidine Double lumen hemodialysis catheter was inserted into right internal jugular vein using the Seldinger technique.  Was placed over guidewire removing the previous central venous catheter in the right IJ  Evaluation Blood flow good Complications: No apparent complications Patient did tolerate procedure well. Chest X-ray ordered to verify placement.  CXR: pending.   Haseeb Fiallos A Breelynn Bankert 12/29/2019

## 2019-12-29 NOTE — Procedures (Signed)
Arterial Catheter Insertion Procedure Note Courtney Zamora [id number] 05-11-1986  Procedure: Insertion of right femoral arterial Catheter  Indications: Blood pressure monitoring  Procedure Details Consent: Unable to obtain consent because of emergent medical necessity. Time Out: Verified patient identification, verified procedure, site/side was marked, verified correct patient position, special equipment/implants available, medications/allergies/relevent history reviewed, required imaging and test results available.  Performed  Maximum sterile technique was used including antiseptics, cap, gloves, gown, hand hygiene, mask and sheet. Skin prep: Chlorhexidine; local anesthetic administered 20 gauge catheter was inserted into right femoral artery using the Seldinger technique. ULTRASOUND GUIDANCE USED: YES Evaluation Blood flow Unsuccessful. Complications: No apparent complications.  Right femoral artery was accessed with a needle under direct ultrasound guidance, pulsatile blood flow was confirmed in guidewire was advanced.  Unable to thread the catheter over guidewire after 4/5 cm likely due to the curve off the subcutaneous tissue.  Guidewire was removed.  Courtney Zamora 12/29/2019

## 2019-12-29 NOTE — Procedures (Signed)
Central Venous Catheter Insertion Procedure Note KIYARA BOUFFARD [id number] 07/28/1985  Procedure: Insertion of Central Venous Catheter Indications: Drug and/or fluid administration  Procedure Details Consent: Risks of procedure as well as the alternatives and risks of each were explained to the (patient/caregiver).  Consent for procedure obtained. Time Out: Verified patient identification, verified procedure, site/side was marked, verified correct patient position, special equipment/implants available, medications/allergies/relevent history reviewed, required imaging and test results available.  Performed  Maximum sterile technique was used including antiseptics, cap, gloves, gown, hand hygiene, mask and sheet. Skin prep: Chlorhexidine; local anesthetic administered A antimicrobial bonded/coated triple lumen catheter was placed in the left internal jugular vein using the Seldinger technique.  Evaluation Blood flow good Complications: No apparent complications Patient did tolerate procedure well. Chest X-ray ordered to verify placement.  CXR: pending.  Jearldean Gutt A Natascha Edmonds 12/29/2019, 12:19 PM

## 2019-12-29 NOTE — Progress Notes (Signed)
eLink Physician-Brief Progress Note Patient Name: OCEAN KEARLEY DOB: 03-20-1986 MRN: [id number]   Date of Service  12/29/2019  HPI/Events of Note  Patient is in worsening shock. RN no longer able to get reliable NIBP, but pressures that are being obtained have MAPs in 40s-50s. Multiple attempts to obtain an arterial line have been made by both RT and bedside team (both radial and femoral attempted) but have been unsuccessful in part due to the patient's body habitus.  I note low ionized calcium and ongoing hypokalemia.   eICU Interventions  - 2g calcium gluconate. - 40 mEq KCl IV. - 1L LR bolus. - Add epinephrine drip to existing levo + vaso. - Add back stress dose steroids (50mg  hydrocortisone Q6H). - I have asked the bedside team to consider an axillary arterial line for better titration of vasopressors, since attempts to insert an A-line at all other sites have been exhausted.     Intervention Category Major Interventions: Shock - evaluation and management  Trajan Grove 12/29/2019, 2:03 AM

## 2019-12-29 NOTE — Procedures (Signed)
Name: Courtney Zamora MRN: [id number] DOB:   1985/08/07  DOS:  12/28/19   PROCEDURE NOTE  Procedure:  Arterial catheter placement.  Indications:  Need for invasive hemodynamic monitoring / frequent arterial blood gases measurement.  Consent:  Consent was implied due to the emergency nature of the procedure.  Procedure summary:  The patient was identified as Courtney Zamora   Sterile technique was used. The patient's R and L groin was prepped using chlorhexidine / alcohol scrub and the field was draped in usual sterile fashion with protective barrier.  Difficult habitus and small, deep arteries, unable to successfully place line.  Complications:  No immediate complications were noted.  Estimated blood loss:  Less then 5 mL  Darcella Gasman Stpehanie Montroy, PA-C 12/28/19 11:08 PM

## 2019-12-29 NOTE — Progress Notes (Signed)
MD notified that endo tool suggest to transition to SQ insulin will keep patient on endo tool for now per MD.

## 2019-12-29 NOTE — Procedures (Signed)
Arterial Catheter Insertion Procedure Note Courtney Zamora [id number] 1986/04/25  Procedure: Insertion of left femoral arterial Catheter  Indications: Blood pressure monitoring  Procedure Details Consent: Unable to obtain consent because of emergent medical necessity. Time Out: Verified patient identification, verified procedure, site/side was marked, verified correct patient position, special equipment/implants available, medications/allergies/relevent history reviewed, required imaging and test results available.  Performed  Maximum sterile technique was used including antiseptics, cap, gloves, gown, hand hygiene, mask and sheet. Skin prep: Chlorhexidine; local anesthetic administered 20 gauge catheter was inserted into left femoral artery using the Seldinger technique. ULTRASOUND GUIDANCE USED: YES Evaluation Blood flow unsuccessful. Complications: No apparent complications.  Left femoral arterial catheter placement was attempted.  Needle was advanced into the left femoral artery and pulsatile blood flow was confirmed.  Unable to advance the guidewire beyond 5/6 cm.  Guidewire was removed and procedure was abandoned.   [name] 12/29/2019

## 2019-12-29 NOTE — Progress Notes (Signed)
Pharmacy Antibiotic Note  Courtney Zamora is a 34 y.o. female admitted on 01/22/20 with seizure, DKA, and concern for sepsis and intra-abdominal infection.   Patient now in acute liver and renal failure - no urine output CRRT to start this morning  Vanc random this am also 33  Height: 5\' 4"  (162.6 cm) Weight: (!) 168.2 kg (370 lb 13 oz) IBW/kg (Calculated) : 54.7  Temp (24hrs), Avg:103 F (39.4 C), Min:100.2 F (37.9 C), Max:105.1 F (40.6 C)  Recent Labs  Lab 2020-01-22 2004 January 22, 2020 2004 Jan 22, 2020 2115 2020-01-22 2313 12/28/19 0249 12/28/19 0249 12/28/19 02/27/20 12/28/19 0934 12/28/19 1717 12/28/19 2153 12/29/19 0046 12/29/19 0103 12/29/19 0414  WBC 10.7*  --   --   --  14.3*  --   --   --   --   --  9.3  --   --   CREATININE 2.13*   < >  --   --  2.84*   < > 1.60* 3.44* 4.69* 5.15*  --  5.74*  --   LATICACIDVEN  --   --  10.8* 10.1*  --   --  >11.0*  --   --   --   --  6.9*  --   VANCORANDOM  --   --   --   --   --   --   --   --   --   --   --   --  33   < > = values in this interval not displayed.    Estimated Creatinine Clearance: 21.6 mL/min (A) (by C-G formula based on SCr of 5.74 mg/dL (H)).    Plan: Adjust zosyn to 3.375 gm iv q6 h (over 30 min) Continue to hold vanc - repeat random lvl in am and will initiate dosing when < 20  12/31/19, PharmD, BCPS, BCCCP Clinical Pharmacist [phone]  Please check AMION for all [facility] numbers  12/29/2019 8:36 AM

## 2019-12-29 NOTE — Progress Notes (Signed)
Echocardiogram 2D Echocardiogram has been performed.  Courtney Zamora 12/29/2019, 8:24 AM

## 2019-12-29 NOTE — Plan of Care (Signed)
Please see detailed procedure note for details.  Both right and left-sided femoral arterial lines attempted.  Unable to advance guidewire likely obstructing the subcutaneous tissue.  Axillary area was explored, unable to locate axillary artery with confidence.

## 2019-12-29 NOTE — Progress Notes (Signed)
eLink Physician-Brief Progress Note Patient Name: Courtney Zamora DOB: 1985-10-26 MRN: [id number]   Date of Service  12/29/2019  HPI/Events of Note  RN notifies me that Ileene Patrick is advising transition off insulin drip to [place] insulin.   eICU Interventions  We will continue DKA insulin drip at this time (with dextrose administration) since the patient is critically ill and on stress dose steroids for shock of uncertain etiology.     Intervention Category Intermediate Interventions: Hyperglycemia - evaluation and treatment  Janae Bridgeman 12/29/2019, 5:45 AM

## 2019-12-29 NOTE — Procedures (Addendum)
Patient Name:Courtney Zamora LNL:[id number] Epilepsy Attending:Ethan Clayburn Annabelle Harman Referring Physician/Provider:Dr Georgiana Spinner Aroor Duration: 12/29/2019 0400 to 12/29/2019 1351  Patient history:34 year old female with a history of morbid obesity, RA, seizures not on any medications, poorly controlled diabetes whopresentedwith left upper extremityabnormal jerking andams. EEG to evaluate for seizure  Level of alertness:lethargic  AEDs during EEG study:LEV  Technical aspects: This EEG study was done with scalp electrodes positioned according to the 10-20 International system of electrode placement. Electrical activity was acquired at a sampling rate of 500Hz  and reviewed with a high frequency filter of 70Hz  and a low frequency filter of 1Hz . EEG data were recorded continuously and digitally stored.   Description: EEG showed continuous generalized polymorphic 3 to 6 Hz theta-delta slowing. Intermittent triphasic waves, generalized were also noted. Hyperventilation and photic stimulation were not performed.   ABNORMALITY - Continuousslow, generalized - Triphasic waves, generalized  IMPRESSION: Thisstudy is suggestive of severe diffuse encephalopathy, nonspecific etiology but could be secondary to toxic-metabolic causes.No seizures or definite epileptiform discharges were seen throughout the recording.   Courtney Zamora 

## 2019-12-29 NOTE — Progress Notes (Addendum)
NAME:  Courtney Zamora, MRN:  [id number], DOB:  [birth year], LOS: 2 ADMISSION DATE:  2019-12-30, CONSULTATION DATE:  12/29/19 REFERRING MD:  EDP, CHIEF COMPLAINT:  seizures   Brief History   34 y.o. F with PMH of RA, Type 2 DM, possible seizures, HTN who presented with shaking of abnormal jerking movements thought to be seizures, AMS and hyperglycemia.  Pt loaded with Keppra, head CT negative, also developed fever and had a significant lactic acidosis.  Given AMS, PCCM consulted for admission.  History of present illness   Courtney Zamora is a  RA on cellcept, Type 2 DM, possible seizures (no prior EEG and no medications per husband), HTN who presented with shaking of abnormal jerking movements thought to be seizures, AMS and hyperglycemia.   He reports that about two days ago she was having trouble holding a cup because of weakness and was only eating fruit because everything tasted metallic.  He states her blood sugar was high, but it usually is.  She had some constipation, but did not complain of abdominal pain, diarrhea, nausea, vomiting or fever.  Earlier in the day, he noted her L arm shaking, he thought she was having a seizure, though this was different than her usual seizure.  These he describes as being diagnosed by a PCP and not on any medication.  She was sometimes scratch her face and pull her hair and then be fine immediately afterwards, she does not seek medical care after these episodes.  In the ED, pt was having continued twitching/jerking of the L side and was loaded with Keppra. CT head negative.  ED course significant for worsening mental status, oxygen requirement, lactic acid 10.8, K 2.4, Glucose >1100, anion gap 25, Co2 34.  Pt was able to intermittently answer questions and c/o abdominal pain.   She was given K and 3L IVF, she then spiked a fever to 101F.  Pt seen by neurology and EEG did not show evidence of seizures.   PCCM consulted for admission  Past Medical History    has a past medical history of Anxiety, Asthma, Chronic headache, Chronic lower back pain, Depression, Essential hypertension, Family history of adverse reaction to anesthesia, GERD (gastroesophageal reflux disease), Manic state (HCC), Migraine, Obesity, OSA on CPAP, Osteoarthritis, knee, Pneumonia, Prolonged QT syndrome, PTSD (post-traumatic stress disorder), Rheumatoid arthritis (HCC), Seizures (HCC), Severe needle phobia, and Type II diabetes mellitus (HCC).   Significant Hospital Events   6/11 Admit to [facility] 6/13 still profoundly sick, septic requiring 3 pressors   Consults:  neurology Renal: Initiate CRRT  Procedures:  6/12 endotracheal intubation 6/12 central line placement in the right IJ 6/13 left IJ central line placement 6/13 right IJ hemodialysis catheter  Significant Diagnostic Tests:  6/11 CT head>> limited due to patient motion, no acute findings 6/11 CT abd/pelvis>> There is some mild fat stranding about the pancreas, which may represent acute pancreatitis. There is some mild wall thickening of the duodenum, which may be reactive or secondary to duodenitis. 6/12 chest x-ray following IJ and endotracheal tube-line in good placement, ET tube adequately placed OG tube adequately placed  Micro Data:  6/12 BCx2-no growth so far 6/12 UC>>  Antimicrobials:  Zosyn 6/12- Vancomycin 6/12-  Interim history/subjective:  Encephalopathic Respiratory rate in the 30s and 40s Patient responds to name calling raises right hand and was able to give a thumbs up this morning  Objective   Blood pressure 104/71, pulse (!) 110, temperature 100.2 F (37.9 C), temperature source  Core, resp. rate (!) 38, height 5\' 4"  (1.626 m), weight (!) 168.2 kg, SpO2 97 %. CVP:  [8 mmHg-18 mmHg] 18 mmHg  Vent Mode: PRVC FiO2 (%):  [70 %-100 %] 70 % Set Rate:  [16 bmp] 16 bmp Vt Set:  [430 mL] 430 mL PEEP:  [5 cmH20-8 cmH20] 8 cmH20 Plateau Pressure:  [19 cmH20-34 cmH20] 26 cmH20   Intake/Output  Summary (Last 24 hours) at 12/29/2019 1435 Last data filed at 12/29/2019 1300 Gross per 24 hour  Intake 8809.7 ml  Output 1110 ml  Net 7699.7 ml   Filed Weights   01/12/2020 2341 12/28/19 0100  Weight: (!) 184.6 kg (!) 168.2 kg    General:  Obese F, does not appear to be in acute distress HEENT: MM pink/moist, endotracheal tube in place Neuro: Follows one-step commands CV: S1-S2 appreciated PULM: Coarse breath sounds bilaterally,  GI: soft, obese, diffuse TTP, bsx4 active   Resolved Hospital Problem list     Assessment & Plan:  34 y.o. F who presented with possible seizures and AMS,  found to have hyperglycemia, possible sepsis and pancreatitis   DKA/HHS Initial glucose >1100 with gap of 25, pH 7.4 -Sugars about 250 -Insulin aspart Endo tool -Beta hydroxybutyrate 0.41 -q4hrs BMP -Hgb A1c -Continue to follow aggressively  Septic Shock Unclear source currently -has received 30cc/kg IVF -follow lactic acid-trending down -blood cultures-negative so far -broad spectrum abx-vancomycin and Zosyn-continue the same -On vasopressin, epinephrine and Levophed -stress dose steroids  Multiple failed attempts at placing an arterial line Trial by multiple providers  Acute Hypoxic Respiratory Failure Possibly due to AMS, CXR with vascular congestion and cardiomegaly.  Last Echo in 2019 with preserved EF -Repeat echocardiogram performed 12/29/2019-poor window, results pending -Continue [place] supplementation to maintain sats >92%  Abdominal pain and possible pancreatitis Peri-pancreatic stranding on CT -Lipase elevated to 1600 -Follow UA and urine/blood cultures -Continue broad spectrum abx with Zosyn/Vancomycin  Acute encephalopathy and possible seizures CT head was negative, I am unsure whether patient's reported seizure activity at home represents seizure activity, she is not on medication and per husband, will shake, but also scratch her face and pull her hair and then return to  her baseline -Seen by neurology, appreciate recs.  Load with Keppra 20mg /kg -EEG without epileptiform discharges, suggestive of severe diffuse encephalopathy-EEG monitoring discontinued, MRI ordered -neck is supple,  -Appreciate neuro follow-up -Discussed with Dr. 12/31/2019 this morning  Acute Kidney Injury, hypokalemia Anuria -Appreciate renal involvement -CRRT will be initiated  History of HTN -hold home medications  Rheumatoid Arthritis  -Hold Cellcept for now  Very ill Guarded prognosis Best practice:  Diet: Npo Pain/Anxiety/Delirium protocol (if indicated): n/a VAP protocol (if indicated): In place DVT prophylaxis: heparin GI prophylaxis: Protonix Glucose control: glucose gtt Mobility: bed rest Code Status: full code Family Communication: Pt's spouse updated at bedside this morning Disposition: ICU  Labs   CBC: Recent Labs  Lab 01/02/2020 2004 12/19/2019 2059 12/28/19 0249 12/28/19 2217 12/29/19 0046  WBC 10.7*  --  14.3*  --  9.3  NEUTROABS 7.9*  --   --   --  6.2  HGB 18.9* 21.8* 17.0* 17.3* 15.9*  HCT 67.2* 64.0* 56.8* 51.0* 56.4*  MCV 104.0*  --  98.6  --  103.5*  PLT 250  --  201  --  113*    Basic Metabolic Panel: Recent Labs  Lab 12/17/2019 2053 12/29/2019 2059 12/28/19 02/26/20 12/28/19 8099 12/28/19 8338 12/28/19 2505 12/28/19 1717 12/28/19 2153 12/28/19 2217 12/29/19 0046  12/29/19 0103  NA  --    < > 142   < > 151*  --  149* 148* 148*  --  148*  K  --    < > 3.2*   < > 2.6*   < > 3.0* 2.8* 2.8* 2.8* 2.8*  CL  --    < > 104  --  98  --  100 101  --   --  101  CO2  --    < > 19*  --  35*  --  30 31  --   --  24  GLUCOSE  --    < > 329*  --  285*  --  239* 270*  --   --  204*  BUN  --    < > 18  --  31*  --  35* 36*  --   --  37*  CREATININE  --    < > 1.60*  --  3.44*  --  4.69* 5.15*  --   --  5.74*  CALCIUM  --    < > 7.1*  --  8.8*  --  7.6* 7.3*  --   --  7.0*  MG 4.3*  --   --   --   --   --   --   --   --  2.1  --    < > = values in  this interval not displayed.   GFR: Estimated Creatinine Clearance: 21.6 mL/min (A) (by C-G formula based on SCr of 5.74 mg/dL (H)). Recent Labs  Lab 12-29-2019 2004 29-Dec-2019 2115 December 29, 2019 2313 12/28/19 0249 12/28/19 0621 12/29/19 0046 12/29/19 0103  WBC 10.7*  --   --  14.3*  --  9.3  --   LATICACIDVEN  --  10.8* 10.1*  --  >11.0*  --  6.9*    Liver Function Tests: Recent Labs  Lab 2019-12-29 2023 12/29/19 0046  AST 76* 6,313*  ALT 101* 1,153*  ALKPHOS 369* 158*  BILITOT 1.9* 2.4*  PROT 8.3* 5.4*  ALBUMIN 4.0 2.6*   Recent Labs  Lab 12/28/19 0249  LIPASE 1,652*   Recent Labs  Lab 12/28/19 0934 12/29/19 0636  AMMONIA 77* 91*    ABG    Component Value Date/Time   HCO3 31.3 (H) 12/28/2019 2217   TCO2 33 (H) 12/28/2019 2217   O2SAT 50.0 12/28/2019 2217     Coagulation Profile: No results for input(s): INR, PROTIME in the last 168 hours.  Cardiac Enzymes: No results for input(s): CKTOTAL, CKMB, CKMBINDEX, TROPONINI in the last 168 hours.  HbA1C: Hemoglobin A1C  Date/Time Value Ref Range Status  10/25/2019 08:58 AM 9.7 (A) 4.0 - 5.6 % Final  04/04/2019 11:07 AM 9.8 (A) 4.0 - 5.6 % Final   Hgb A1c MFr Bld  Date/Time Value Ref Range Status  09/05/2017 06:00 PM 9.6 (H) 4.8 - 5.6 % Final    Comment:    (NOTE) Pre diabetes:          5.7%-6.4% Diabetes:              >6.4% Glycemic control for   <7.0% adults with diabetes     CBG: Recent Labs  Lab 12/29/19 0629 12/29/19 0719 12/29/19 0839 12/29/19 1057 12/29/19 1225  GLUCAP 251* 243* 258* 277* 297*    Review of Systems:   Unable to obtain secondary to mental status  Past Medical History  She,  has a past medical history of Anxiety, Asthma,  Chronic headache, Chronic lower back pain, Depression, Essential hypertension, Family history of adverse reaction to anesthesia, GERD (gastroesophageal reflux disease), Manic state ([place]), Migraine, Obesity, OSA on CPAP, Osteoarthritis, knee, Pneumonia,  Prolonged QT syndrome, PTSD (post-traumatic stress disorder), Rheumatoid arthritis ([place]), Seizures ([place]), Severe needle phobia, and Type II diabetes mellitus ([place]).   Surgical History    Past Surgical History:  Procedure Laterality Date  . TONSILLECTOMY  1994     Social History   reports that she has never smoked. She has never used smokeless tobacco. She reports previous alcohol use. She reports that she does not use drugs.   Family History   Her family history includes Anxiety disorder in her mother; Depression in her mother; Diabetes in her father and paternal uncle; Heart disease in her paternal grandfather; Hypertension in her maternal grandfather, maternal grandmother, paternal grandfather, and paternal grandmother; Hypothyroidism in her mother; Pancreatic cancer in her maternal grandfather.    The patient is critically ill with multiple organ systems failure and requires high complexity decision making for assessment and support, frequent evaluation and titration of therapies, application of advanced monitoring technologies and extensive interpretation of multiple databases. Critical Care Time devoted to patient care services described in this note independent of APP/resident time (if applicable)  is 50 minutes.   Sherrilyn Rist MD [facility] Personal pager: [phone] If unanswered, please page CCM On-call: [phone]

## 2019-12-29 NOTE — Progress Notes (Signed)
LTM EEG discontinued - no skin breakdown at unhook.   

## 2019-12-29 NOTE — Progress Notes (Signed)
Continues to do poorly Maxed out on pressors  Added albumin every 6  1 g calcium gluconate  Prognosis is not good

## 2019-12-29 NOTE — Progress Notes (Signed)
eLink Physician-Brief Progress Note Patient Name: Courtney Zamora DOB: [birth year] MRN: [id number]   Date of Service  12/29/2019  HPI/Events of Note  RN wishes to confirm that the patient should remain on insulin drip. Question prompted by [employer] recommending transition to [place] insulin.   eICU Interventions  Patient is critically ill on 3 pressors and stress dose steroids for shock. Continue insulin drip at this time.      Intervention Category Minor Interventions: Other:  Janae Bridgeman 12/29/2019, 9:49 PM

## 2019-12-29 NOTE — Progress Notes (Addendum)
Subjective: Mildly improved  Exam: Vitals:   12/29/19 0800 12/29/19 0900  BP: (!) 92/58 (!) 86/63  Pulse:  (!) 115  Resp: (!) 36 (!) 33  Temp: 100.2 F (37.9 C)   SpO2:  100%   Gen: In bed, intubated Resp: ventilated Abd: soft, nt  Neuro: MS: Opens eyes, follows commands but not really reliably and only after significant stimulation. WN:UUVOZ, Eyes cross midline bilaterally.  Motor: moves all extremities to noxious simtulation.  Sensory:as above.   Pertinent Labs: NA 145 K 3.3 Cr 2.84  Impression: 34 yo F with left sided twitching and weakness in the setting of severe hyperglycemia.  She does have frequent sharply contoured waves which are most consistent with triphasics, but I also have concern this may represent some degree of irritability.  She is on a maximum dose of Keppra for her renal function currently.  I would favor getting an MRI and therefore will discontinue EEG for now, though if she is not continuing to improve, could consider repeating this.  Recommendations: 1) Continue keppra for now 2) MRI brain 3) can discontinue EEG monitoring  4) Will continue to follow.   This patient is critically ill and at significant risk of neurological worsening, death and care requires constant monitoring of vital signs, hemodynamics,respiratory and cardiac monitoring, neurological assessment, discussion with family, other specialists and medical decision making of high complexity. I spent 35 minutes of neurocritical care time  in the care of  this patient. This was time spent independent of any time provided by nurse practitioner or PA.  Ritta Slot, MD [facility] [phone]  If 7pm- 7am, please page neurology on call as listed in AMION. 12/29/2019  10:16 AM

## 2019-12-29 NOTE — Consult Note (Addendum)
[facility] Nephrology Consultation Note  Requesting MD: Dr Mary Sella, Demetrio Lapping Reason for consult: AKI, Anuria.  HPI:  Courtney Zamora is a 34 y.o. female with history of DM, rheumatoid arthritis on chronic CellCept, possible seizure, HTN who was presented to the ER on 6/11 with altered mental status, seizure and diabetic ketoacidosis, seen as a consultation at the request of Dr. Wynona Neat for AKI on CKD and anuria. In the ER she was having twitching and jerking of left side when she was loaded with Keppra.  CT head was negative.  The labs showed elevated lactic acid level of 10.8, potassium 2.4 and blood sugar level more than 1100 with anion gap of 25.  She received fluid resuscitation and admitted to ICU and received treatment for diabetic ketoacidosis.  She required mechanical ventilation for respiratory failure. She has normal creatinine level at baseline.  On admission the creatinine level was 2.13.  She developed septic shock requiring multiple pressors and antibiotics.  The urine output very minimal and remained anuric overnight despite of challenge with IV Lasix. Currently she is intubated.  On multiple pressors including Levophed, vasopressin, epinephrine.  Grossly volume overload on exam.  The creatinine level increased to 5.74 today. Noted that liver enzymes are markedly elevated, AST 63013 and ALT 1153.  The lactic acid is 6.9 today. Unable to obtain history and review of system from the patient as she is intubated and sedated.  I spoke with her spouse over the phone.  Creatinine, Ser  Date/Time Value Ref Range Status  12/29/2019 01:03 AM 5.74 (H) 0.44 - 1.00 mg/dL Final  82/95/6213 08:65 PM 5.15 (H) 0.44 - 1.00 mg/dL Final  78/46/9629 52:84 PM 4.69 (H) 0.44 - 1.00 mg/dL Final  13/24/4010 27:25 AM 3.44 (H) 0.44 - 1.00 mg/dL Final    Comment:    REPEATED TO VERIFY  12/28/2019 06:21 AM 1.60 (H) 0.44 - 1.00 mg/dL Final    Comment:    DELTA CHECK NOTED  12/28/2019 02:49 AM  2.84 (H) 0.44 - 1.00 mg/dL Final  36/64/4034 74:25 PM 2.13 (H) 0.44 - 1.00 mg/dL Final  95/63/8756 43:32 AM 0.81 0.40 - 1.20 mg/dL Final    PMHx:   Past Medical History:  Diagnosis Date  . Anxiety   . Asthma   . Chronic headache    "1-2/week" (04/09/2018)  . Chronic lower back pain   . Depression   . Essential hypertension   . Family history of adverse reaction to anesthesia    "mom would get PONV" (04/09/2018)  . GERD (gastroesophageal reflux disease)   . Manic state (HCC)   . Migraine    "a few/year" (04/09/2018)  . Obesity   . OSA on CPAP   . Osteoarthritis, knee   . Pneumonia    "this is my 2nd time" (04/09/2018)  . Prolonged QT syndrome   . PTSD (post-traumatic stress disorder)   . Rheumatoid arthritis (HCC)   . Seizures (HCC)    "stress induced; 1 in this past year" (04/09/2018)  . Severe needle phobia   . Type II diabetes mellitus (HCC)     Past Surgical History:  Procedure Laterality Date  . TONSILLECTOMY  1994    Family Hx:  Family History  Problem Relation Age of Onset  . Hypothyroidism Mother   . Depression Mother   . Anxiety disorder Mother   . Diabetes Father   . Diabetes Paternal Uncle   . Hypertension Maternal Grandmother   . Hypertension Maternal Grandfather   .  Pancreatic cancer Maternal Grandfather   . Hypertension Paternal Grandmother   . Heart disease Paternal Grandfather   . Hypertension Paternal Grandfather     Social History:  reports that she has never smoked. She has never used smokeless tobacco. She reports previous alcohol use. She reports that she does not use drugs.  Allergies:  Allergies  Allergen Reactions  . Bee Venom Anaphylaxis  . Cinnamon Anaphylaxis  . Doxycycline Hives  . Ibuprofen Other (See Comments)    Abdominal pain  . Lexapro [Escitalopram Oxalate] Other (See Comments)    Generalized body shaking. ? sz  . Latex Rash  . Tape Hives, Rash and Other (See Comments)    EKG STICKERS    Medications: Prior to  Admission medications   Medication Sig Start Date End Date Taking? Authorizing Provider  albuterol (PROVENTIL HFA;VENTOLIN HFA) 108 (90 Base) MCG/ACT inhaler Inhale 2 puffs into the lungs every 6 (six) hours as needed for wheezing or shortness of breath (wheezing). For shortness of breath. 01/05/18   Marcine Matar, MD  albuterol (PROVENTIL) (2.5 MG/3ML) 0.083% nebulizer solution Take 3 mLs (2.5 mg total) by nebulization every 6 (six) hours as needed for wheezing or shortness of breath. 03/12/19   Parrett, Virgel Bouquet, NP  atenolol (TENORMIN) 25 MG tablet Take 0.5 tablets (12.5 mg total) by mouth daily. 02/12/18   Marcine Matar, MD  BELBUCA 900 MCG FILM DISSOLVE 1 FILM INSIDE OF CHEEKS Q 12 HOURS 05/30/18   [provider]  Biotin w/ Vitamins C & E (HAIR/SKIN/NAILS PO) Take 1 tablet by mouth daily.    [provider]  Calcium Citrate 200 MG TABS Take 2 tablets (400 mg total) by mouth every other day. Patient taking differently: Take 200 mg by mouth daily.  10/05/16   Newt Lukes, MD  cetirizine (ZYRTEC) 10 MG tablet TAKE 1 TABLET BY MOUTH EVERY NIGHT AT BEDTIME Patient taking differently: Take 10 mg by mouth at bedtime.  03/02/18   Marcine Matar, MD  Continuous Blood Gluc Sensor (DEXCOM G6 SENSOR) MISC 1 Device by Does not apply route daily as needed. Change every 10 days 04/04/19   Romero Belling, MD  Crisaborole (EUCRISA) 2 % OINT Apply topically.    [provider]  DULoxetine (CYMBALTA) 30 MG capsule TAKE 1 CAPSULE BY MOUTH TWICE DAILY Patient taking differently: Take 30 mg by mouth 2 (two) times daily.  03/05/18   Marcine Matar, MD  famotidine (PEPCID) 20 MG tablet TK 1 T PO BID 05/28/18   [provider]  furosemide (LASIX) 20 MG tablet TAKE 1 TABLET BY MOUTH EVERY OTHER DAY Patient taking differently: Take 20 mg by mouth every other day.  03/30/18   Marcine Matar, MD  gabapentin (NEURONTIN) 300 MG capsule TAKE 1 CAPSULE BY MOUTH EVERY  MORNING, 1 CAPSULE BY MOUTH AT NOON, AND 2 CAPSULES BY MOUTH AT BEDTIME Patient taking differently: Take 300-600 mg by mouth See admin instructions. TAKE 1 CAPSULE BY MOUTH EVERY MORNING, 1 CAPSULE BY MOUTH AT NOON, AND 2 CAPSULES BY MOUTH AT BEDTIME 03/05/18   Marcine Matar, MD  glucose blood (ACCU-CHEK AVIVA PLUS) test strip UAD UP TO TID TO CHECK BLOOD SUGAR 03/06/18   Marcine Matar, MD  hydrochlorothiazide (HYDRODIURIL) 25 MG tablet Take 1 tablet (25 mg total) by mouth daily. 10/02/17   Marcine Matar, MD  hydrOXYzine (ATARAX/VISTARIL) 10 MG tablet TAKE 1 TABLET(10 MG) BY MOUTH THREE TIMES DAILY AS NEEDED 10/19/18  Marcine Matar, MD  insulin aspart (NOVOLOG FLEXPEN) 100 UNIT/ML FlexPen 3 times a day (just before each meal) 50-70-70 units, and pen needles 5/day 06/20/19   Romero Belling, MD  insulin glargine, 2 Unit Dial, (TOUJEO MAX SOLOSTAR) 300 UNIT/ML Solostar Pen Inject 350 Units into the skin every morning. 10/25/19   Romero Belling, MD  Insulin Pen Needle (BD PEN NEEDLE NANO 2ND GEN) 32G X 4 MM MISC 1 each by Does not apply route in the morning, at noon, in the evening, and at bedtime. E11.9 09/23/19   Romero Belling, MD  ipratropium-albuterol (DUONEB) 0.5-2.5 (3) MG/3ML SOLN Take 3 mLs by nebulization every 6 (six) hours as needed. Patient taking differently: Take 3 mLs by nebulization every 4 (four) hours as needed (for shortness of breath or wheezing).  07/28/17   Mannam, Colbert Coyer, MD  montelukast (SINGULAIR) 10 MG tablet TAKE 1 TABLET(10 MG) BY MOUTH DAILY 07/30/18   Marcine Matar, MD  Multiple Vitamins-Minerals (WOMENS MULTI) CAPS Take 1 capsule by mouth daily.    [provider]  mycophenolate (CELLCEPT) 500 MG tablet Take 500 mg by mouth 2 (two) times daily. 05/28/18   [provider]  nabumetone (RELAFEN) 500 MG tablet Take 1 tablet (500 mg total) by mouth 2 (two) times daily as needed. 06/08/18   Hilts, Casimiro Needle, MD  Omega-3 Fatty Acids (FISH OIL) 1000 MG  CAPS Take 1 capsule by mouth daily.    [provider]  oxyCODONE-acetaminophen (PERCOCET/ROXICET) 5-325 MG tablet TK 1 OR 2 TS PO Q  6 H PRN P 05/29/18   [provider]  pantoprazole (PROTONIX) 40 MG tablet TAKE 1 TABLET(40 MG) BY MOUTH TWICE DAILY BEFORE A MEAL 11/16/18   Danis, Andreas Blower, MD  phentermine 15 MG capsule Take 15 mg by mouth every morning.    [provider]  promethazine (PHENERGAN) 25 MG tablet Take 1 tablet (25 mg total) by mouth every 8 (eight) hours as needed for nausea or vomiting. 12/08/17   Marcine Matar, MD  Respiratory Therapy Supplies (FLUTTER) DEVI As directed. 07/28/17   Mannam, Colbert Coyer, MD  Semaglutide (OZEMPIC, 1 MG/DOSE, [place]) Inject 1 mg into the skin once a week.    [provider]  Spacer/Aero-Holding Chambers (AEROCHAMBER MV) inhaler Use as instructed 01/05/18   Marcine Matar, MD  tiZANidine (ZANAFLEX) 4 MG tablet TAKE 1 TABLET(4 MG) BY MOUTH EVERY 6 HOURS AS NEEDED FOR MUSCLE SPASMS 04/16/18   Marcine Matar, MD  Vitamin D, Ergocalciferol, (DRISDOL) 50000 units CAPS capsule Take 50,000 Units by mouth once a week. 03/26/18   [provider]  VYVANSE 50 MG capsule Take 50 mg by mouth daily. 05/17/18   [provider]    I have reviewed the patient's current medications.  Labs:  Results for orders placed or performed during the hospital encounter of 12/23/2019 (from the past 48 hour(s))  CBG monitoring, ED     Status: Abnormal   Collection Time: 12/31/2019  7:47 PM  Result Value Ref Range   Glucose-Capillary >600 (HH) 70 - 99 mg/dL    Comment: Glucose reference range applies only to samples taken after fasting for at least 8 hours.  Basic metabolic panel     Status: Abnormal   Collection Time: 01/05/2020  8:04 PM  Result Value Ref Range   Sodium 138 135 - 145 mmol/L   Potassium 2.7 (LL) 3.5 - 5.1 mmol/L    Comment: CRITICAL RESULT CALLED TO, READ BACK BY AND  VERIFIED WITH: RN K GIBSON @2045  01/12/2020 BY  S GEZAHEGN    Chloride 79 (L) 98 - 111 mmol/L   CO2 34 (H) 22 - 32 mmol/L   Glucose, Bld 1,186 (HH) 70 - 99 mg/dL    Comment: Glucose reference range applies only to samples taken after fasting for at least 8 hours. RESULTS CONFIRMED BY MANUAL DILUTION CRITICAL RESULT CALLED TO, READ BACK BY AND VERIFIED WITH: RN W WALLACE @2108  01/02/2020 BY S GEZAHEGN    BUN 21 (H) 6 - 20 mg/dL   Creatinine, Ser 1.61 (H) 0.44 - 1.00 mg/dL   Calcium 09.6 8.9 - 04.5 mg/dL   GFR calc non Af Amer 29 (L) >60 mL/min   GFR calc Af Amer 34 (L) >60 mL/min   Anion gap 25 (H) 5 - 15    Comment: Performed at [facility] Lab, [address] [place], [place] [address]  CBC WITH DIFFERENTIAL     Status: Abnormal   Collection Time: 12/24/2019  8:04 PM  Result Value Ref Range   WBC 10.7 (H) 4.0 - 10.5 K/uL   RBC 6.46 (H) 3.87 - 5.11 MIL/uL   Hemoglobin 18.9 (H) 12.0 - 15.0 g/dL   HCT 19.1 (H) 36 - 46 %   MCV 104.0 (H) 80.0 - 100.0 fL   MCH 29.3 26.0 - 34.0 pg   MCHC 28.1 (L) 30.0 - 36.0 g/dL   RDW 47.8 29.5 - 62.1 %   Platelets 250 150 - 400 K/uL    Comment: REPEATED TO VERIFY   nRBC 0.0 0.0 - 0.2 %   Neutrophils Relative % 72 %   Neutro Abs 7.9 (H) 1.7 - 7.7 K/uL   Lymphocytes Relative 20 %   Lymphs Abs 2.2 0.7 - 4.0 K/uL   Monocytes Relative 6 %   Monocytes Absolute 0.6 0 - 1 K/uL   Eosinophils Relative 0 %   Eosinophils Absolute 0.0 0 - 0 K/uL   Basophils Relative 1 %   Basophils Absolute 0.1 0 - 0 K/uL   Immature Granulocytes 1 %   Abs Immature Granulocytes 0.05 0.00 - 0.07 K/uL    Comment: Performed at [facility] Lab, [address], [place], [place] [address]  I-Stat beta hCG blood, ED     Status: None   Collection Time: 12/29/2019  8:08 PM  Result Value Ref Range   I-stat hCG, quantitative <5.0 <5 mIU/mL   Comment 3            Comment:   GEST. AGE      CONC.  (mIU/mL)   <=1 WEEK        5 - 50     2 WEEKS       50 - 500     3 WEEKS       100 - 10,000     4 WEEKS     1,000 - 30,000         FEMALE AND NON-PREGNANT FEMALE:     LESS THAN 5 mIU/mL   Hepatic function panel     Status: Abnormal   Collection Time: 12/30/2019  8:23 PM  Result Value Ref Range   Total Protein 8.3 (H) 6.5 - 8.1 g/dL   Albumin 4.0 3.5 - 5.0 g/dL   AST 76 (H) 15 - 41 U/L   ALT 101 (H) 0 - 44 U/L    Comment: RESULTS CONFIRMED BY MANUAL DILUTION   Alkaline Phosphatase 369 (H) 38 - 126 U/L  Total Bilirubin 1.9 (H) 0.3 - 1.2 mg/dL   Bilirubin, Direct 0.9 (H) 0.0 - 0.2 mg/dL   Indirect Bilirubin 1.0 (H) 0.3 - 0.9 mg/dL    Comment: Performed at [facility] Lab, [address] [place], [place] [address]  Acetaminophen level     Status: Abnormal   Collection Time: January 06, 2020  8:23 PM  Result Value Ref Range   Acetaminophen (Tylenol), Serum <10 (L) 10 - 30 ug/mL    Comment: (NOTE) Therapeutic concentrations vary significantly. A range of 10-30 ug/mL  may be an effective concentration for many patients. However, some  are best treated at concentrations outside of this range. Acetaminophen concentrations >150 ug/mL at 4 hours after ingestion  and >50 ug/mL at 12 hours after ingestion are often associated with  toxic reactions.  Performed at [facility] Lab, [address], [place], [place] [address]   Salicylate level     Status: Abnormal   Collection Time: 01/06/20  8:23 PM  Result Value Ref Range   Salicylate Lvl <7.0 (L) 7.0 - 30.0 mg/dL    Comment: Performed at [facility] Lab, [address], [place], [place] [address]  Magnesium     Status: Abnormal   Collection Time: 01/06/20  8:53 PM  Result Value Ref Range   Magnesium 4.3 (H) 1.7 - 2.4 mg/dL    Comment: Performed at [facility] Lab, [address], [place], [place] [address]  I-Stat venous blood gas, [facility] ED)     Status: Abnormal   Collection Time: 01/06/20  8:59 PM  Result Value Ref Range   pH, Ven 7.436 (H) 7.25 - 7.43   pCO2, Ven 65.7 (H) 44 - 60 mmHg   pO2, Ven 24.0 (LL) 32 - 45 mmHg   Bicarbonate 44.3 (H) 20.0 - 28.0  mmol/L   TCO2 46 (H) 22 - 32 mmol/L   O2 Saturation 43.0 %   Acid-Base Excess 15.0 (H) 0.0 - 2.0 mmol/L   Sodium 139 135 - 145 mmol/L   Potassium 2.4 (LL) 3.5 - 5.1 mmol/L   Calcium, Ion 1.01 (L) 1.15 - 1.40 mmol/L   HCT 64.0 (H) 36 - 46 %   Hemoglobin 21.8 (HH) 12.0 - 15.0 g/dL   Sample type VENOUS   Lactic acid, plasma     Status: Abnormal   Collection Time: 01/06/2020  9:15 PM  Result Value Ref Range   Lactic Acid, Venous 10.8 (HH) 0.5 - 1.9 mmol/L    Comment: CRITICAL RESULT CALLED TO, READ BACK BY AND VERIFIED WITH: RN Tonye Becket  Jan 06, 2020 BY S GEZAHEGN Performed at [facility] Lab, [address], [place], [place] [address]   SARS Coronavirus 2 by RT PCR (hospital order, performed in [facility] hospital lab) Nasopharyngeal Nasopharyngeal Swab     Status: None   Collection Time: 01/06/20  9:38 PM   Specimen: Nasopharyngeal Swab  Result Value Ref Range   SARS Coronavirus 2 NEGATIVE NEGATIVE    Comment: (NOTE) SARS-CoV-2 target nucleic acids are NOT DETECTED.  The SARS-CoV-2 RNA is generally detectable in upper and lower respiratory specimens during the acute phase of infection. The lowest concentration of SARS-CoV-2 viral copies this assay can detect is 250 copies / mL. A negative result does not preclude SARS-CoV-2 infection and should not be used as the sole basis for treatment or other patient management decisions.  A negative result may occur with improper specimen collection / handling, submission of specimen other than nasopharyngeal swab, presence  of viral mutation(s) within the areas targeted by this assay, and inadequate number of viral copies (<250 copies / mL). A negative result must be combined with clinical observations, patient history, and epidemiological information.  Fact Sheet for Patients:   BoilerBrush.com.cy  Fact Sheet for Healthcare Providers: https://pope.com/  This test is not yet approved or   cleared by the [place] FDA and has been authorized for detection and/or diagnosis of SARS-CoV-2 by FDA under an Emergency Use Authorization (EUA).  This EUA will remain in effect (meaning this test can be used) for the duration of the COVID-19 declaration under Section 564(b)(1) of the Act, 21 U.S.C. section 360bbb-3(b)(1), unless the authorization is terminated or revoked sooner.  Performed at [facility] Lab, [address], [place], [place] [address]   CBG monitoring, ED     Status: Abnormal   Collection Time: 01/03/20 11:11 PM  Result Value Ref Range   Glucose-Capillary >600 (HH) 70 - 99 mg/dL    Comment: Glucose reference range applies only to samples taken after fasting for at least 8 hours.  Lactic acid, plasma     Status: Abnormal   Collection Time: Jan 03, 2020 11:13 PM  Result Value Ref Range   Lactic Acid, Venous 10.1 (HH) 0.5 - 1.9 mmol/L    Comment: CRITICAL VALUE NOTED.  VALUE IS CONSISTENT WITH PREVIOUSLY REPORTED AND CALLED VALUE. Performed at [facility] Lab, [address], [place], [place] [address]   Glucose, capillary     Status: Abnormal   Collection Time: 12/28/19 12:56 AM  Result Value Ref Range   Glucose-Capillary >600 (HH) 70 - 99 mg/dL    Comment: Glucose reference range applies only to samples taken after fasting for at least 8 hours.  MRSA PCR Screening     Status: None   Collection Time: 12/28/19  1:07 AM   Specimen: Nasal Mucosa; Nasopharyngeal  Result Value Ref Range   MRSA by PCR NEGATIVE NEGATIVE    Comment:        The GeneXpert MRSA Assay (FDA approved for NASAL specimens only), is one component of a comprehensive MRSA colonization surveillance program. It is not intended to diagnose MRSA infection nor to guide or monitor treatment for MRSA infections. Performed at [facility] Lab, [address], [place], [place] [address]   Rapid urine drug screen (hospital performed)     Status: Abnormal   Collection Time: 12/28/19  1:23  AM  Result Value Ref Range   Opiates NONE DETECTED NONE DETECTED   Cocaine NONE DETECTED NONE DETECTED   Benzodiazepines POSITIVE (A) NONE DETECTED   Amphetamines NONE DETECTED NONE DETECTED   Tetrahydrocannabinol NONE DETECTED NONE DETECTED   Barbiturates NONE DETECTED NONE DETECTED    Comment: (NOTE) DRUG SCREEN FOR MEDICAL PURPOSES ONLY.  IF CONFIRMATION IS NEEDED FOR ANY PURPOSE, NOTIFY LAB WITHIN 5 DAYS.  LOWEST DETECTABLE LIMITS FOR URINE DRUG SCREEN Drug Class                     Cutoff (ng/mL) Amphetamine and metabolites    1000 Barbiturate and metabolites    200 Benzodiazepine                 200 Tricyclics and metabolites     300 Opiates and metabolites        300 Cocaine and metabolites        300 THC  50 Performed at [facility] Lab, [address] [place], [place] [address]   Urinalysis, Routine w reflex microscopic     Status: Abnormal   Collection Time: 12/28/19  1:23 AM  Result Value Ref Range   Color, Urine AMBER (A) YELLOW    Comment: BIOCHEMICALS MAY BE AFFECTED BY COLOR   APPearance CLOUDY (A) CLEAR   Specific Gravity, Urine 1.025 1.005 - 1.030   pH 5.0 5.0 - 8.0   Glucose, UA >=500 (A) NEGATIVE mg/dL   Hgb urine dipstick MODERATE (A) NEGATIVE   Bilirubin Urine NEGATIVE NEGATIVE   Ketones, ur NEGATIVE NEGATIVE mg/dL   Protein, ur 604 (A) NEGATIVE mg/dL   Nitrite NEGATIVE NEGATIVE   Leukocytes,Ua NEGATIVE NEGATIVE   RBC / HPF 0-5 0 - 5 RBC/hpf   WBC, UA 21-50 0 - 5 WBC/hpf   Bacteria, UA RARE (A) NONE SEEN   Squamous Epithelial / LPF 0-5 0 - 5   Mucus PRESENT     Comment: Performed at [facility] Lab, [address] [place], [place] [address]  Pregnancy, urine     Status: None   Collection Time: 12/28/19  1:23 AM  Result Value Ref Range   Preg Test, Ur NEGATIVE NEGATIVE    Comment:        THE SENSITIVITY OF THIS METHODOLOGY IS >20 mIU/mL. Performed at [facility], [address] [place], [place]  [address]   HIV Antibody (routine testing w rflx)     Status: None   Collection Time: 12/28/19  2:49 AM  Result Value Ref Range   HIV Screen 4th Generation wRfx Non Reactive Non Reactive    Comment: Performed at [facility] Lab, [address], [place], [place] [address]  Basic metabolic panel     Status: Abnormal   Collection Time: 12/28/19  2:49 AM  Result Value Ref Range   Sodium 145 135 - 145 mmol/L   Potassium 3.3 (L) 3.5 - 5.1 mmol/L   Chloride 91 (L) 98 - 111 mmol/L   CO2 28 22 - 32 mmol/L   Glucose, Bld 748 (HH) 70 - 99 mg/dL    Comment: Glucose reference range applies only to samples taken after fasting for at least 8 hours. CRITICAL RESULT CALLED TO, READ BACK BY AND VERIFIED WITH: RN M TOLER @0329  12/28/19 BY S GEZAHEGN    BUN 27 (H) 6 - 20 mg/dL   Creatinine, Ser 6.21 (H) 0.44 - 1.00 mg/dL   Calcium 9.3 8.9 - 30.8 mg/dL   GFR calc non Af Amer 21 (L) >60 mL/min   GFR calc Af Amer 24 (L) >60 mL/min   Anion gap 26 (H) 5 - 15    Comment: Performed at [facility] Lab, [address] [place], [place] [address]  Osmolality     Status: Abnormal   Collection Time: 12/28/19  2:49 AM  Result Value Ref Range   Osmolality 363 (HH) 275 - 295 mOsm/kg    Comment: CRITICAL RESULT CALLED TO, READ BACK BY AND VERIFIED WITH: RN TOLER MICHELLE AT 0348 BY MESSAN HOUEGNIFIO ON 12/28/2019 Performed at [facility] Lab, [address], [place], [place] [address]   Culture, blood (routine x 2)     Status: None (Preliminary result)   Collection Time: 12/28/19  2:49 AM   Specimen: BLOOD  Result Value Ref Range   Specimen Description BLOOD LEFT ANTECUBITAL    Special Requests      BOTTLES DRAWN AEROBIC  ONLY Blood Culture adequate volume   Culture      NO GROWTH 1 DAY Performed at [facility] Lab, [address], [place], [place] [address]    Report Status PENDING   Culture, blood (routine x 2)     Status: None (Preliminary result)   Collection Time: 12/28/19  2:49 AM   Specimen:  BLOOD RIGHT HAND  Result Value Ref Range   Specimen Description BLOOD RIGHT HAND    Special Requests      BOTTLES DRAWN AEROBIC ONLY Blood Culture adequate volume   Culture      NO GROWTH 1 DAY Performed at [facility] Lab, [address], [place], [place] [address]    Report Status PENDING   Beta-hydroxybutyric acid     Status: Abnormal   Collection Time: 12/28/19  2:49 AM  Result Value Ref Range   Beta-Hydroxybutyric Acid 0.41 (H) 0.05 - 0.27 mmol/L    Comment: Performed at [facility], [address], [place], [place] [address]  Brain natriuretic peptide     Status: Abnormal   Collection Time: 12/28/19  2:49 AM  Result Value Ref Range   B Natriuretic Peptide 138.6 (H) 0.0 - 100.0 pg/mL    Comment: Performed at [facility] Lab, [address] [place], [place] [address]  CBC     Status: Abnormal   Collection Time: 12/28/19  2:49 AM  Result Value Ref Range   WBC 14.3 (H) 4.0 - 10.5 K/uL   RBC 5.76 (H) 3.87 - 5.11 MIL/uL   Hemoglobin 17.0 (H) 12.0 - 15.0 g/dL   HCT 29.5 (H) 36 - 46 %   MCV 98.6 80.0 - 100.0 fL   MCH 29.5 26.0 - 34.0 pg   MCHC 29.9 (L) 30.0 - 36.0 g/dL   RDW 62.1 30.8 - 65.7 %   Platelets 201 150 - 400 K/uL   nRBC 0.3 (H) 0.0 - 0.2 %    Comment: Performed at [facility] Lab, [address] [place], [place] [address]  Lipase, blood     Status: Abnormal   Collection Time: 12/28/19  2:49 AM  Result Value Ref Range   Lipase 1,652 (H) 11 - 51 U/L    Comment: RESULTS CONFIRMED BY MANUAL DILUTION Performed at  [facility] Lab, [address], [place], [place] [address]   Triglycerides     Status: Abnormal   Collection Time: 12/28/19  2:49 AM  Result Value Ref Range   Triglycerides 1,475 (H) <150 mg/dL    Comment: RESULTS CONFIRMED BY MANUAL DILUTION Performed at [facility], [address] [place], [place] [address]   Glucose, capillary     Status: Abnormal   Collection Time: 12/28/19  3:46 AM  Result Value Ref Range    Glucose-Capillary >600 (HH) 70 - 99 mg/dL    Comment: Glucose reference range applies only to samples taken after fasting for at least 8 hours.  Glucose, capillary     Status: Abnormal   Collection Time: 12/28/19  4:31 AM  Result Value Ref Range   Glucose-Capillary >600 (HH) 70 - 99 mg/dL    Comment: Glucose reference range applies only to samples taken after fasting for at least 8 hours.  Glucose, capillary     Status: Abnormal   Collection Time: 12/28/19  5:07 AM  Result Value Ref Range   Glucose-Capillary >600 (HH) 70 - 99 mg/dL    Comment: Glucose reference range applies only to samples  taken after fasting for at least 8 hours.  Glucose, capillary     Status: Abnormal   Collection Time: 12/28/19  5:37 AM  Result Value Ref Range   Glucose-Capillary >600 (HH) 70 - 99 mg/dL    Comment: Glucose reference range applies only to samples taken after fasting for at least 8 hours.  Glucose, capillary     Status: Abnormal   Collection Time: 12/28/19  6:10 AM  Result Value Ref Range   Glucose-Capillary 557 (HH) 70 - 99 mg/dL    Comment: Glucose reference range applies only to samples taken after fasting for at least 8 hours.  Basic metabolic panel     Status: Abnormal   Collection Time: 12/28/19  6:21 AM  Result Value Ref Range   Sodium 142 135 - 145 mmol/L   Potassium 3.2 (L) 3.5 - 5.1 mmol/L   Chloride 104 98 - 111 mmol/L   CO2 19 (L) 22 - 32 mmol/L   Glucose, Bld 329 (H) 70 - 99 mg/dL    Comment: Glucose reference range applies only to samples taken after fasting for at least 8 hours.   BUN 18 6 - 20 mg/dL   Creatinine, Ser 7.32 (H) 0.44 - 1.00 mg/dL    Comment: DELTA CHECK NOTED   Calcium 7.1 (L) 8.9 - 10.3 mg/dL   GFR calc non Af Amer 41 (L) >60 mL/min   GFR calc Af Amer 48 (L) >60 mL/min   Anion gap 19 (H) 5 - 15    Comment: Performed at [facility], [address] [place], [place] [address]  Lactic acid, plasma     Status: Abnormal   Collection Time: 12/28/19  6:21 AM   Result Value Ref Range   Lactic Acid, Venous >11.0 (HH) 0.5 - 1.9 mmol/L    Comment: CRITICAL VALUE NOTED.  VALUE IS CONSISTENT WITH PREVIOUSLY REPORTED AND CALLED VALUE. Performed at [facility], [address] [place], [place] [address]   Troponin I (High Sensitivity)     Status: Abnormal   Collection Time: 12/28/19  6:21 AM  Result Value Ref Range   Troponin I (High Sensitivity) 151 (HH) <18 ng/L    Comment: CRITICAL RESULT CALLED TO, READ BACK BY AND VERIFIED WITH: C.TURNER,RN 0745 12/28/19 CLARK,S (NOTE) Elevated high sensitivity troponin I (hsTnI) values and significant  changes across serial measurements may suggest ACS but many other  chronic and acute conditions are known to elevate hsTnI results.  Refer to the Links section for chest pain algorithms and additional  guidance. Performed at [facility], [address] [place], [place] [address]   Osmolality     Status: Abnormal   Collection Time: 12/28/19  6:21 AM  Result Value Ref Range   Osmolality 311 (H) 275 - 295 mOsm/kg    Comment: Performed at [facility], [address] [place], [place] [address]  Glucose, capillary     Status: Abnormal   Collection Time: 12/28/19  6:51 AM  Result Value Ref Range   Glucose-Capillary 412 (H) 70 - 99 mg/dL    Comment: Glucose reference range applies only to samples taken after fasting for at least 8 hours.  Glucose, capillary     Status: Abnormal   Collection Time: 12/28/19  7:30 AM  Result Value Ref Range   Glucose-Capillary 438 (H) 70 - 99 mg/dL    Comment: Glucose reference range applies only to samples taken after fasting for at least 8 hours.  Glucose, capillary     Status: Abnormal   Collection Time: 12/28/19  8:08 AM  Result Value Ref Range   Glucose-Capillary 386 (H) 70 - 99 mg/dL    Comment: Glucose reference range applies only to samples taken after fasting for at least 8 hours.  Basic metabolic panel     Status: Abnormal   Collection Time:  12/28/19  9:34 AM  Result Value Ref Range   Sodium 151 (H) 135 - 145 mmol/L   Potassium 2.6 (LL) 3.5 - 5.1 mmol/L    Comment: CRITICAL RESULT CALLED TO, READ BACK BY AND VERIFIED WITH: TURNER,C RN @ 1010 12/28/19 LEONARD,A    Chloride 98 98 - 111 mmol/L   CO2 35 (H) 22 - 32 mmol/L   Glucose, Bld 285 (H) 70 - 99 mg/dL    Comment: Glucose reference range applies only to samples taken after fasting for at least 8 hours.   BUN 31 (H) 6 - 20 mg/dL   Creatinine, Ser 1.61 (H) 0.44 - 1.00 mg/dL    Comment: REPEATED TO VERIFY   Calcium 8.8 (L) 8.9 - 10.3 mg/dL   GFR calc non Af Amer 17 (L) >60 mL/min   GFR calc Af Amer 20 (L) >60 mL/min   Anion gap 18 (H) 5 - 15    Comment: Performed at [facility], [address] [place], [place] [address]  Ammonia     Status: Abnormal   Collection Time: 12/28/19  9:34 AM  Result Value Ref Range   Ammonia 77 (H) 9 - 35 umol/L    Comment: Performed at [facility], [address] [place], [place] [address]  Glucose, capillary     Status: Abnormal   Collection Time: 12/28/19 10:03 AM  Result Value Ref Range   Glucose-Capillary 260 (H) 70 - 99 mg/dL    Comment: Glucose reference range applies only to samples taken after fasting for at least 8 hours.  Glucose, capillary     Status: Abnormal   Collection Time: 12/28/19 11:13 AM  Result Value Ref Range   Glucose-Capillary 246 (H) 70 - 99 mg/dL    Comment: Glucose reference range applies only to samples taken after fasting for at least 8 hours.  Glucose, capillary     Status: Abnormal   Collection Time: 12/28/19 12:23 PM  Result Value Ref Range   Glucose-Capillary 249 (H) 70 - 99 mg/dL    Comment: Glucose reference range applies only to samples taken after fasting for at least 8 hours.  Glucose, capillary     Status: Abnormal   Collection Time: 12/28/19  1:35 PM  Result Value Ref Range   Glucose-Capillary 249 (H) 70 - 99 mg/dL    Comment: Glucose reference range applies only to samples taken  after fasting for at least 8 hours.  Glucose, capillary     Status: Abnormal   Collection Time: 12/28/19  2:55 PM  Result Value Ref Range   Glucose-Capillary 242 (H) 70 - 99 mg/dL    Comment: Glucose reference range applies only to samples taken after fasting for at least 8 hours.  Glucose, capillary     Status: Abnormal   Collection Time: 12/28/19  4:20 PM  Result Value Ref Range   Glucose-Capillary 245 (H) 70 - 99 mg/dL    Comment: Glucose reference range applies only to samples taken after fasting for at least 8 hours.  Basic metabolic panel     Status: Abnormal   Collection Time: 12/28/19  5:17 PM  Result Value Ref Range   Sodium 149 (H) 135 - 145 mmol/L   Potassium 3.0 (L) 3.5 - 5.1 mmol/L   Chloride 100 98 - 111 mmol/L   CO2 30 22 - 32 mmol/L   Glucose, Bld 239 (H) 70 - 99 mg/dL    Comment: Glucose reference range applies only to samples taken after fasting for at least 8 hours.   BUN 35 (H) 6 - 20 mg/dL   Creatinine, Ser 4.69 (H) 0.44 - 1.00 mg/dL   Calcium 7.6 (L) 8.9 - 10.3 mg/dL   GFR calc non Af Amer 11 (L) >60 mL/min   GFR calc Af Amer 13 (L) >60 mL/min   Anion gap 19 (H) 5 - 15    Comment: Performed at [facility] [address], [place], [place] [address]  Glucose, capillary     Status: Abnormal   Collection Time: 12/28/19  5:27 PM  Result Value Ref Range   Glucose-Capillary 267 (H) 70 - 99 mg/dL    Comment: Glucose reference range applies only to samples taken after fasting for at least 8 hours.  Glucose, capillary     Status: Abnormal   Collection Time: 12/28/19  6:35 PM  Result Value Ref Range   Glucose-Capillary 225 (H) 70 - 99 mg/dL    Comment: Glucose reference range applies only to samples taken after fasting for at least 8 hours.  Glucose, capillary     Status: Abnormal   Collection Time: 12/28/19  7:36 PM  Result Value Ref Range   Glucose-Capillary 194 (H) 70 - 99 mg/dL    Comment: Glucose reference range applies only to samples taken after  fasting for at least 8 hours.  Glucose, capillary     Status: Abnormal   Collection Time: 12/28/19  8:36 PM  Result Value Ref Range   Glucose-Capillary 221 (H) 70 - 99 mg/dL    Comment: Glucose reference range applies only to samples taken after fasting for at least 8 hours.  Glucose, capillary     Status: Abnormal   Collection Time: 12/28/19  9:37 PM  Result Value Ref Range   Glucose-Capillary 231 (H) 70 - 99 mg/dL    Comment: Glucose reference range applies only to samples taken after fasting for at least 8 hours.  Basic metabolic panel     Status: Abnormal   Collection Time: 12/28/19  9:53 PM  Result Value Ref Range   Sodium 148 (H) 135 - 145 mmol/L   Potassium 2.8 (L) 3.5 - 5.1 mmol/L   Chloride 101 98 - 111 mmol/L   CO2 31 22 - 32 mmol/L   Glucose, Bld 270 (H) 70 - 99 mg/dL    Comment: Glucose reference range applies only to samples taken after fasting for at least 8 hours.   BUN 36 (H) 6 - 20 mg/dL   Creatinine, Ser 5.15 (H) 0.44 - 1.00 mg/dL   Calcium 7.3 (L) 8.9 - 10.3 mg/dL   GFR calc non Af Amer 10 (L) >60 mL/min   GFR calc Af Amer 12 (L) >60 mL/min   Anion gap 16 (H) 5 - 15    Comment: Performed at [facility] [address] [place], [place] [address]  POCT Activated clotting time     Status: None   Collection Time: 12/28/19  9:58 PM  Result Value Ref Range   Activated Clotting Time 731 seconds  POCT I-Stat EG7     Status: Abnormal  Collection Time: 12/28/19 10:17 PM  Result Value Ref Range   pH, Ven 7.367 7.25 - 7.43   pCO2, Ven 55.9 44 - 60 mmHg   pO2, Ven 33.0 32 - 45 mmHg   Bicarbonate 31.3 (H) 20.0 - 28.0 mmol/L   TCO2 33 (H) 22 - 32 mmol/L   O2 Saturation 50.0 %   Acid-Base Excess 5.0 (H) 0.0 - 2.0 mmol/L   Sodium 148 (H) 135 - 145 mmol/L   Potassium 2.8 (L) 3.5 - 5.1 mmol/L   Calcium, Ion 0.92 (L) 1.15 - 1.40 mmol/L   HCT 51.0 (H) 36 - 46 %   Hemoglobin 17.3 (H) 12.0 - 15.0 g/dL   Patient temperature 161.0 F    Collection site [employer] by HIDE    Sample type VENOUS   Glucose, capillary     Status: Abnormal   Collection Time: 12/28/19 10:35 PM  Result Value Ref Range   Glucose-Capillary 235 (H) 70 - 99 mg/dL    Comment: Glucose reference range applies only to samples taken after fasting for at least 8 hours.  Glucose, capillary     Status: Abnormal   Collection Time: 12/28/19 11:34 PM  Result Value Ref Range   Glucose-Capillary 247 (H) 70 - 99 mg/dL    Comment: Glucose reference range applies only to samples taken after fasting for at least 8 hours.  Glucose, capillary     Status: Abnormal   Collection Time: 12/29/19 12:35 AM  Result Value Ref Range   Glucose-Capillary 233 (H) 70 - 99 mg/dL    Comment: Glucose reference range applies only to samples taken after fasting for at least 8 hours.  CBC with Differential/Platelet     Status: Abnormal   Collection Time: 12/29/19 12:46 AM  Result Value Ref Range   WBC 9.3 4.0 - 10.5 K/uL   RBC 5.45 (H) 3.87 - 5.11 MIL/uL   Hemoglobin 15.9 (H) 12.0 - 15.0 g/dL   HCT 96.0 (H) 36 - 46 %   MCV 103.5 (H) 80.0 - 100.0 fL   MCH 29.2 26.0 - 34.0 pg   MCHC 28.2 (L) 30.0 - 36.0 g/dL   RDW 45.4 09.8 - 11.9 %   Platelets 113 (L) 150 - 400 K/uL    Comment: Immature Platelet Fraction may be clinically indicated, consider ordering this additional test [id number] PLATELET COUNT CONFIRMED BY SMEAR REPEATED TO VERIFY    nRBC 4.8 (H) 0.0 - 0.2 %   Neutrophils Relative % 66 %   Neutro Abs 6.2 1.7 - 7.7 K/uL   Lymphocytes Relative 29 %   Lymphs Abs 2.7 0.7 - 4.0 K/uL   Monocytes Relative 3 %   Monocytes Absolute 0.2 0 - 1 K/uL   Eosinophils Relative 1 %   Eosinophils Absolute 0.1 0 - 0 K/uL   Basophils Relative 0 %   Basophils Absolute 0.0 0 - 0 K/uL   Immature Granulocytes 1 %   Abs Immature Granulocytes 0.07 0.00 - 0.07 K/uL    Comment: Performed at [facility], [address] [place], [place] [address]  Hepatic function panel     Status: Abnormal   Collection  Time: 12/29/19 12:46 AM  Result Value Ref Range   Total Protein 5.4 (L) 6.5 - 8.1 g/dL   Albumin 2.6 (L) 3.5 - 5.0 g/dL   AST 6,578 (H) 15 - 41 U/L    Comment: RESULTS CONFIRMED BY MANUAL DILUTION   ALT 1,153 (H) 0 -  44 U/L   Alkaline Phosphatase 158 (H) 38 - 126 U/L   Total Bilirubin 2.4 (H) 0.3 - 1.2 mg/dL   Bilirubin, Direct 1.1 (H) 0.0 - 0.2 mg/dL   Indirect Bilirubin 1.3 (H) 0.3 - 0.9 mg/dL    Comment: Performed at [facility], [address] [place], [place] [address]  Magnesium     Status: None   Collection Time: 12/29/19 12:46 AM  Result Value Ref Range   Magnesium 2.1 1.7 - 2.4 mg/dL    Comment: Performed at [facility], [address], [place], [place] [address]  Potassium     Status: Abnormal   Collection Time: 12/29/19 12:46 AM  Result Value Ref Range   Potassium 2.8 (L) 3.5 - 5.1 mmol/L    Comment: Performed at [facility], [address], [place], [place] [address]  Basic metabolic panel     Status: Abnormal   Collection Time: 12/29/19  1:03 AM  Result Value Ref Range   Sodium 148 (H) 135 - 145 mmol/L   Potassium 2.8 (L) 3.5 - 5.1 mmol/L   Chloride 101 98 - 111 mmol/L   CO2 24 22 - 32 mmol/L   Glucose, Bld 204 (H) 70 - 99 mg/dL    Comment: Glucose reference range applies only to samples taken after fasting for at least 8 hours.   BUN 37 (H) 6 - 20 mg/dL   Creatinine, Ser 3.14 (H) 0.44 - 1.00 mg/dL   Calcium 7.0 (L) 8.9 - 10.3 mg/dL   GFR calc non Af Amer 9 (L) >60 mL/min   GFR calc Af Amer 10 (L) >60 mL/min   Anion gap 23 (H) 5 - 15    Comment: Performed at [facility], [address], [place], [place] [address]  Lactic acid, plasma     Status: Abnormal   Collection Time: 12/29/19  1:03 AM  Result Value Ref Range   Lactic Acid, Venous 6.9 (HH) 0.5 - 1.9 mmol/L    Comment: CRITICAL VALUE NOTED.  VALUE IS CONSISTENT WITH PREVIOUSLY REPORTED AND CALLED VALUE. Performed at [facility], [address] [place], [place]  [address]   Glucose, capillary     Status: Abnormal   Collection Time: 12/29/19  2:21 AM  Result Value Ref Range   Glucose-Capillary 176 (H) 70 - 99 mg/dL    Comment: Glucose reference range applies only to samples taken after fasting for at least 8 hours.  Glucose, capillary     Status: Abnormal   Collection Time: 12/29/19  3:29 AM  Result Value Ref Range   Glucose-Capillary 187 (H) 70 - 99 mg/dL    Comment: Glucose reference range applies only to samples taken after fasting for at least 8 hours.  Vancomycin, random     Status: None   Collection Time: 12/29/19  4:14 AM  Result Value Ref Range   Vancomycin Rm 33     Comment:        Random Vancomycin therapeutic range is dependent on dosage and time of specimen collection. A peak range is 20.0-40.0 ug/mL A trough range is 5.0-15.0 ug/mL        Performed at [facility], [address], [place], [place] [address]   Glucose, capillary     Status: Abnormal   Collection Time: 12/29/19  4:25 AM  Result Value Ref Range   Glucose-Capillary 154 (H) 70 - 99 mg/dL    Comment: Glucose reference range  applies only to samples taken after fasting for at least 8 hours.  Glucose, capillary     Status: Abnormal   Collection Time: 12/29/19  5:26 AM  Result Value Ref Range   Glucose-Capillary 200 (H) 70 - 99 mg/dL    Comment: Glucose reference range applies only to samples taken after fasting for at least 8 hours.  Glucose, capillary     Status: Abnormal   Collection Time: 12/29/19  6:29 AM  Result Value Ref Range   Glucose-Capillary 251 (H) 70 - 99 mg/dL    Comment: Glucose reference range applies only to samples taken after fasting for at least 8 hours.  Ammonia     Status: Abnormal   Collection Time: 12/29/19  6:36 AM  Result Value Ref Range   Ammonia 91 (H) 9 - 35 umol/L    Comment: Performed at [facility], [address], [place], [place] [address]  Glucose, capillary     Status: Abnormal   Collection Time: 12/29/19  7:19 AM   Result Value Ref Range   Glucose-Capillary 243 (H) 70 - 99 mg/dL    Comment: Glucose reference range applies only to samples taken after fasting for at least 8 hours.     ROS: Unable to obtain review of system from the patient.  Physical Exam: Vitals:   12/29/19 0630 12/29/19 0700  BP: (!) 81/64   Pulse:  (!) 108  Resp: (!) 38 (!) 37  Temp:    SpO2:  100%     General exam: Ill-looking female sedated and intubated Respiratory system: Coarse mechanical breath sound bilateral Cardiovascular system: S1 & S2 heard, RRR.  Edema present in all extremities. Gastrointestinal system: Abdomen is distended, soft. Central nervous system: Sedated and not responding Extremities: Edematous, no cyanosis or clubbing Skin: No rashes, lesions or ulcers Psychiatry: Unable to assess as patient is sedated.  Assessment/Plan:  #Acute kidney injury due to ischemic ATN in the setting of septic shock and diabetic ketoacidosis.  She is anuric despite of using Lasix and her creatinine level has gone up to 5.74 today.  The urinalysis has proteinuria without any RBC.  We will quantify the proteinuria.  The kidney ultrasound showed normal right kidney and left kidney was unremarkable in recent CT scan. Plan to start CRRT today.  I have discussed with patient's spouse who likes to try CRRT for 24-48 hours however not willing to try for long time.  I agree with him. Use 4K bath, UF as tolerated.  On 3 pressors.  PCCM is going to place the access. Recommend goals of care discussion.  #Lactic and diabetic ketoacidosis: Complicated by renal failure.  Received insulin and fluid resuscitation in the beginning.  Lactic acid level remains high.  Starting CRRT as above.  #Hypokalemia: We will use 4K bath in dialysis.  #Septic shock: Blood pressure soft on 3 pressors.  On antibiotics per primary team.  #Ventilator dependent respiratory failure: With volume up.  Attempt to UF during dialysis.  #Shock liver with  elevated transaminitis.  Per primary team.  I have discussed with PCCM and patient's spouse.  Thank you for the consult, we will follow with you.  Linford Quintela Jaynie Collins 12/29/2019, 7:59 AM  [employer].

## 2019-12-30 LAB — GLUCOSE, CAPILLARY
Glucose-Capillary: 108 mg/dL — ABNORMAL HIGH (ref 70–99)
Glucose-Capillary: 116 mg/dL — ABNORMAL HIGH (ref 70–99)
Glucose-Capillary: 117 mg/dL — ABNORMAL HIGH (ref 70–99)
Glucose-Capillary: 120 mg/dL — ABNORMAL HIGH (ref 70–99)
Glucose-Capillary: 123 mg/dL — ABNORMAL HIGH (ref 70–99)
Glucose-Capillary: 125 mg/dL — ABNORMAL HIGH (ref 70–99)
Glucose-Capillary: 132 mg/dL — ABNORMAL HIGH (ref 70–99)
Glucose-Capillary: 137 mg/dL — ABNORMAL HIGH (ref 70–99)
Glucose-Capillary: 137 mg/dL — ABNORMAL HIGH (ref 70–99)
Glucose-Capillary: 137 mg/dL — ABNORMAL HIGH (ref 70–99)
Glucose-Capillary: 142 mg/dL — ABNORMAL HIGH (ref 70–99)
Glucose-Capillary: 144 mg/dL — ABNORMAL HIGH (ref 70–99)
Glucose-Capillary: 146 mg/dL — ABNORMAL HIGH (ref 70–99)
Glucose-Capillary: 148 mg/dL — ABNORMAL HIGH (ref 70–99)
Glucose-Capillary: 149 mg/dL — ABNORMAL HIGH (ref 70–99)
Glucose-Capillary: 149 mg/dL — ABNORMAL HIGH (ref 70–99)
Glucose-Capillary: 154 mg/dL — ABNORMAL HIGH (ref 70–99)
Glucose-Capillary: 160 mg/dL — ABNORMAL HIGH (ref 70–99)
Glucose-Capillary: 168 mg/dL — ABNORMAL HIGH (ref 70–99)
Glucose-Capillary: 176 mg/dL — ABNORMAL HIGH (ref 70–99)
Glucose-Capillary: 179 mg/dL — ABNORMAL HIGH (ref 70–99)
Glucose-Capillary: 184 mg/dL — ABNORMAL HIGH (ref 70–99)
Glucose-Capillary: 189 mg/dL — ABNORMAL HIGH (ref 70–99)
Glucose-Capillary: 195 mg/dL — ABNORMAL HIGH (ref 70–99)

## 2019-12-30 LAB — RENAL FUNCTION PANEL
Albumin: 2.8 g/dL — ABNORMAL LOW (ref 3.5–5.0)
Albumin: 3 g/dL — ABNORMAL LOW (ref 3.5–5.0)
Anion gap: 16 — ABNORMAL HIGH (ref 5–15)
Anion gap: 14 (ref 5–15)
BUN: 28 mg/dL — ABNORMAL HIGH (ref 6–20)
BUN: 23 mg/dL — ABNORMAL HIGH (ref 6–20)
CO2: 21 mmol/L — ABNORMAL LOW (ref 22–32)
CO2: 23 mmol/L (ref 22–32)
Calcium: 6.9 mg/dL — ABNORMAL LOW (ref 8.9–10.3)
Calcium: 7.4 mg/dL — ABNORMAL LOW (ref 8.9–10.3)
Chloride: 102 mmol/L (ref 98–111)
Chloride: 101 mmol/L (ref 98–111)
Creatinine, Ser: 3.81 mg/dL — ABNORMAL HIGH (ref 0.44–1.00)
Creatinine, Ser: 3.01 mg/dL — ABNORMAL HIGH (ref 0.44–1.00)
GFR calc Af Amer: 17 mL/min — ABNORMAL LOW (ref >60)
GFR calc Af Amer: 22 mL/min — ABNORMAL LOW (ref >60)
GFR calc non Af Amer: 14 mL/min — ABNORMAL LOW (ref >60)
GFR calc non Af Amer: 19 mL/min — ABNORMAL LOW (ref >60)
Glucose, Bld: 171 mg/dL — ABNORMAL HIGH (ref 70–99)
Glucose, Bld: 183 mg/dL — ABNORMAL HIGH (ref 70–99)
Phosphorus: 2.3 mg/dL — ABNORMAL LOW (ref 2.5–4.6)
Phosphorus: 1.7 mg/dL — ABNORMAL LOW (ref 2.5–4.6)
Potassium: 3.5 mmol/L (ref 3.5–5.1)
Potassium: 3.8 mmol/L (ref 3.5–5.1)
Sodium: 139 mmol/L (ref 135–145)
Sodium: 138 mmol/L (ref 135–145)

## 2019-12-30 LAB — MAGNESIUM
Magnesium: 1.8 mg/dL (ref 1.7–2.4)
Magnesium: 2.1 mg/dL (ref 1.7–2.4)

## 2019-12-30 LAB — PATHOLOGIST SMEAR REVIEW: Path Review: INCREASED

## 2019-12-30 LAB — LACTIC ACID, PLASMA: Lactic Acid, Venous: 2.9 mmol/L (ref 0.5–1.9)

## 2019-12-30 LAB — VANCOMYCIN, RANDOM: Vancomycin Rm: 18

## 2019-12-30 MED ORDER — CALCIUM GLUCONATE-NACL 1-0.675 GM/50ML-% IV SOLN
1.0000 g | Freq: Once | INTRAVENOUS | Status: AC
  Administered 2019-12-30: 1000 mg via INTRAVENOUS
  Filled 2019-12-30: qty 50

## 2019-12-30 MED ORDER — VITAL HIGH PROTEIN PO LIQD: 1000.0000 mL | ORAL | Status: DC

## 2019-12-30 MED ORDER — B COMPLEX-C PO TABS
1.0000 | ORAL_TABLET | Freq: Every day | ORAL | Status: DC
  Administered 2019-12-30 – 2020-01-07 (×9): 1
  Filled 2019-12-30 (×9): qty 1

## 2019-12-30 MED ORDER — PRO-STAT SUGAR FREE PO LIQD
30.0000 mL | Freq: Two times a day (BID) | ORAL | Status: DC
  Administered 2019-12-30: 30 mL
  Filled 2019-12-30: qty 30

## 2019-12-30 MED ORDER — VANCOMYCIN HCL 1500 MG/300ML IV SOLN
1500.0000 mg | INTRAVENOUS | Status: DC
  Administered 2019-12-30 – 2020-01-01 (×3): 1500 mg via INTRAVENOUS
  Filled 2019-12-30 (×4): qty 300

## 2019-12-30 MED ORDER — LEVETIRACETAM 100 MG/ML PO SOLN
500.0000 mg | Freq: Two times a day (BID) | ORAL | Status: DC
  Administered 2019-12-30 – 2019-12-31 (×2): 500 mg
  Filled 2019-12-30 (×2): qty 10

## 2019-12-30 MED ORDER — SODIUM CHLORIDE 0.9 % IV SOLN: 1.0000 g | Freq: Once | INTRAVENOUS | Status: DC

## 2019-12-30 MED ORDER — VITAL HIGH PROTEIN PO LIQD
1000.0000 mL | ORAL | Status: DC
  Administered 2019-12-30: 1000 mL

## 2019-12-30 MED ORDER — VITAL HIGH PROTEIN PO LIQD
1000.0000 mL | ORAL | Status: DC
  Administered 2019-12-31 – 2020-01-07 (×3): 1000 mL
  Filled 2019-12-30: qty 1000

## 2019-12-30 MED ORDER — PRO-STAT SUGAR FREE PO LIQD
30.0000 mL | Freq: Every day | ORAL | Status: DC
  Administered 2019-12-31 – 2020-01-08 (×9): 30 mL
  Filled 2019-12-30 (×9): qty 30

## 2019-12-30 NOTE — Progress Notes (Signed)
Per Dr. Denese Killings, plan is to titrate pressors down and try to keep patient even on CRRT. After pressors are titrated down, fluid removal can be attempted as patient tolerates.

## 2019-12-30 NOTE — Progress Notes (Signed)
Inpatient Diabetes Program Recommendations  AACE/ADA: New Consensus Statement on Inpatient Glycemic Control (2015)  Target Ranges:  Prepandial:   less than 140 mg/dL      Peak postprandial:   less than 180 mg/dL (1-2 hours)      Critically ill patients:  140 - 180 mg/dL   Lab Results  Component Value Date   GLUCAP 137 (H) 12/30/2019   HGBA1C 9.7 (A) 10/25/2019    Review of Glycemic Control Results for ANALYSA, NUTTING (MRN [id number]) as of 12/30/2019 09:59  Ref. Range 12/30/2019 04:30 12/30/2019 05:24 12/30/2019 06:20 12/30/2019 07:20 12/30/2019 08:25  Glucose-Capillary Latest Ref Range: 70 - 99 mg/dL 479 (H) 987 (H) 215 (H) 132 (H) 137 (H)   Diabetes history: DM 2, Sees Romero Belling, Endocrinology (last visit 10/25/19 Outpatient Diabetes medications: Toujeo 350 units Daily, Novolog 50 units breakfast, 70 units lunch, 70 units supper Current orders for Inpatient glycemic control: IV insulin gtt/Endotool Inpatient Diabetes Program Recommendations:   Note patient is on IV insulin and drip rates are >8 units per hour.  Patient was on very high doses of insulin prior to admit and therefore will likely need higher doses of insulin upon transition.  Due to acuity of patient condition, recommend continuation of IV insulin while critically ill.   Thanks  Beryl Meager, RN, BC-ADM Inpatient Diabetes Coordinator Pager [phone] (8a-5p)

## 2019-12-30 NOTE — Progress Notes (Signed)
NAME:  Courtney Zamora, MRN:  [id number], DOB:  05/11/1986, LOS: 3 ADMISSION DATE:  01/15/2020, CONSULTATION DATE:  12/30/19 REFERRING MD:  EDP, CHIEF COMPLAINT:  seizures   Brief History   34 y.o. F with PMH of RA, Type 2 DM, possible seizures, HTN who presented with shaking of abnormal jerking movements thought to be seizures, AMS and hyperglycemia.  Pt loaded with Keppra, head CT negative, also developed fever and had a significant lactic acidosis.  Given AMS, PCCM consulted for admission.  History of present illness   Courtney Zamora is a  RA on cellcept, Type 2 DM, possible seizures (no prior EEG and no medications per husband), HTN who presented with shaking of abnormal jerking movements thought to be seizures, AMS and hyperglycemia.   He reports that about two days ago she was having trouble holding a cup because of weakness and was only eating fruit because everything tasted metallic.  He states her blood sugar was high, but it usually is.  She had some constipation, but did not complain of abdominal pain, diarrhea, nausea, vomiting or fever.  Earlier in the day, he noted her L arm shaking, he thought she was having a seizure, though this was different than her usual seizure.  These he describes as being diagnosed by a PCP and not on any medication.  She was sometimes scratch her face and pull her hair and then be fine immediately afterwards, she does not seek medical care after these episodes.  In the ED, pt was having continued twitching/jerking of the L side and was loaded with Keppra. CT head negative.  ED course significant for worsening mental status, oxygen requirement, lactic acid 10.8, K 2.4, Glucose >1100, anion gap 25, Co2 34.  Pt was able to intermittently answer questions and c/o abdominal pain.   She was given K and 3L IVF, she then spiked a fever to 101F.  Pt seen by neurology and EEG did not show evidence of seizures.   PCCM consulted for admission  Past Medical History    has a past medical history of Anxiety, Asthma, Chronic headache, Chronic lower back pain, Depression, Essential hypertension, Family history of adverse reaction to anesthesia, GERD (gastroesophageal reflux disease), Manic state (HCC), Migraine, Obesity, OSA on CPAP, Osteoarthritis, knee, Pneumonia, Prolonged QT syndrome, PTSD (post-traumatic stress disorder), Rheumatoid arthritis (HCC), Seizures (HCC), Severe needle phobia, and Type II diabetes mellitus (HCC).   Significant Hospital Events   6/11 Admit to [facility] 6/13 still profoundly sick, septic requiring 3 pressors   Consults:  neurology Renal: Initiate CRRT  Procedures:  6/12 endotracheal intubation 6/12 central line placement in the right IJ 6/13 left IJ central line placement 6/13 right IJ hemodialysis catheter  Significant Diagnostic Tests:  6/11 CT head>> limited due to patient motion, no acute findings 6/11 CT abd/pelvis>> There is some mild fat stranding about the pancreas, which may represent acute pancreatitis. There is some mild wall thickening of the duodenum, which may be reactive or secondary to duodenitis. 6/12 chest x-ray following IJ and endotracheal tube-line in good placement, ET tube adequately placed OG tube adequately placed  Micro Data:  6/12 BCx2-no growth so far 6/12 UC>>  Antimicrobials:  Zosyn 6/12- Vancomycin 6/12-  Interim history/subjective:  More alert this morning and able to follow commands in all extremities except LLE. Pt denies pain. Only sedation is Fentanyl 50 gtt Decreased pressor requirements over last 24 hrs. Epi off now, weaning norepi and vasopressin. Respiratory rate in the 30s and  40s. Pt does not appear ready to wean Patient responds to name calling raises right hand and was able to give a thumbs up this morning  CRRT initiated overnight.  Objective   Blood pressure 133/81, pulse (!) 102, temperature 98.1 F (36.7 C), temperature source Axillary, resp. rate (!) 39, height 5\' 4"   (1.626 m), weight (!) 184.8 kg, SpO2 97 %. CVP:  [6 mmHg-18 mmHg] 9 mmHg  Vent Mode: PRVC FiO2 (%):  [50 %-70 %] 50 % Set Rate:  [16 bmp] 16 bmp Vt Set:  [430 mL] 430 mL PEEP:  [8 cmH20] 8 cmH20 Plateau Pressure:  [20 cmH20-23 cmH20] 23 cmH20   Intake/Output Summary (Last 24 hours) at 12/30/2019 0903 Last data filed at 12/30/2019 0900 Gross per 24 hour  Intake 5148.76 ml  Output 2461 ml  Net 2687.76 ml   Filed Weights   01-22-20 2341 12/28/19 0100 12/30/19 0436  Weight: (!) 184.6 kg (!) 168.2 kg (!) 184.8 kg    General:  Obese F, does not appear to be in acute distress HEENT: MM pink/moist, endotracheal tube in place Neuro: Follows one-step commands CV: S1-S2 appreciated PULM: Coarse breath sounds bilaterally,  GI: soft, obese, diffuse TTP, bsx4 active   Resolved Hospital Problem list     Assessment & Plan:  34 y.o. F who presented with possible seizures and AMS,  found to have hyperglycemia, possible sepsis and pancreatitis   DKA/HHS Initial glucose >1100 with gap of 25, pH 7.4. Blood sugars are normalizing now on endotool. Acidosis is resolving with Bicarb at 21 this morning. Suspect intravascular volume depletion given presentation with suspected DKA. We will continue fluid replacement and minimize volume removal with CRRT. -Sugars now 130-190 - Continue albumin q6h for volume repletion -Insulin aspart Endo tool -q4hrs BMP -Hgb A1c -Continue to follow aggressively  Septic Shock Unclear source currently -has received 30cc/kg IVF -follow lactic acid-trending down -blood cultures-negative so far, urine cultures - negative so far  -broad spectrum abx-vancomycin and Zosyn-continue the same -off epinephrine, continue to wean vasopressin and norepinephrine as tolerated  -discontinue stress dose steroids  Multiple failed attempts at placing an arterial line Trial by multiple providers  Acute Hypoxic Respiratory Failure Possibly due to AMS, CXR with vascular congestion  and cardiomegaly.  Last Echo in 2019 with preserved EF -Repeat echocardiogram performed 12/29/2019-poor window, LV EF ~55% w/out regional wall motion abnormalities; LV cavity normal in size, no LVH. RV not visualized. IVC w/ >50% respiratory variability.  -Continue mechanical ventilation to maintain O2 sat >93%  Abdominal pain and possible pancreatitis Peri-pancreatic stranding on CT -Lipase elevated to 1600 on admission -UA unremarkable other than glucose, neg for ketones and urine/blood cultures are no growth to date  -Continue broad spectrum abx with Zosyn/Vancomycin (if cultures remain negative, will consider stopping 6/15) - Repeat LFTs on 6/15  Acute encephalopathy and possible seizures CT head was negative, I am unsure whether patient's reported seizure activity at home represents seizure activity, she is not on medication and per husband, will shake, but also scratch her face and pull her hair and then return to her baseline. EEG without epileptiform discharges, suggestive of severe diffuse encephalopathy-EEG monitoring discontinued, MRI ordered. We will complete once she is more stable. -Seen by neurology, appreciate recs.  -Continue Keppra 500mg  q12 hours  Acute Kidney Injury, hypokalemia Anuria -CRRT initiated overnight - Nephrology following, appreciate recs - Continue to renally dose meds   History of HTN -hold home medications  Rheumatoid Arthritis  -Hold Cellcept for  now   Best practice:  Diet: Npo Pain/Anxiety/Delirium protocol (if indicated): Fentanyl gtt VAP protocol (if indicated): In place DVT prophylaxis: heparin GI prophylaxis: Protonix Glucose control: insulin gtt Mobility: bed rest Code Status: full code Family Communication:  Disposition: ICU  Labs   CBC: Recent Labs  Lab 12/30/2019 2004 01/02/2020 2059 12/28/19 0249 12/28/19 2217 12/29/19 0046  WBC 10.7*  --  14.3*  --  9.3  NEUTROABS 7.9*  --   --   --  6.2  HGB 18.9* 21.8* 17.0* 17.3* 15.9*    HCT 67.2* 64.0* 56.8* 51.0* 56.4*  MCV 104.0*  --  98.6  --  103.5*  PLT 250  --  201  --  113*    Basic Metabolic Panel: Recent Labs  Lab 01/01/2020 2053 01/07/2020 2059 12/28/19 1717 12/28/19 1717 12/28/19 2153 12/28/19 2153 12/28/19 2217 12/29/19 0046 12/29/19 0103 12/29/19 1532 12/30/19 0352  NA  --    < > 149*   < > 148*  --  148*  --  148* 144  145 139  K  --    < > 3.0*   < > 2.8*   < > 2.8* 2.8* 2.8* 3.2*  3.2* 3.5  CL  --    < > 100  --  101  --   --   --  101 102  104 102  CO2  --    < > 30  --  31  --   --   --  24 20*  20* 21*  GLUCOSE  --    < > 239*  --  270*  --   --   --  204* 234*  235* 171*  BUN  --    < > 35*  --  36*  --   --   --  37* 41*  41* 28*  CREATININE  --    < > 4.69*  --  5.15*  --   --   --  5.74* 5.82*  5.87* 3.81*  CALCIUM  --    < > 7.6*  --  7.3*  --   --   --  7.0* 6.4*  6.5* 6.9*  MG 4.3*  --   --   --   --   --   --  2.1  --   --  1.8  PHOS  --   --   --   --   --   --   --   --   --  3.3 2.3*   < > = values in this interval not displayed.   GFR: Estimated Creatinine Clearance: 34.7 mL/min (A) (by C-G formula based on SCr of 3.81 mg/dL (H)). Recent Labs  Lab 01/11/2020 2004 01/05/2020 2115 12/25/2019 2313 12/28/19 0249 12/28/19 0621 12/29/19 0046 12/29/19 0103  WBC 10.7*  --   --  14.3*  --  9.3  --   LATICACIDVEN  --  10.8* 10.1*  --  >11.0*  --  6.9*    Liver Function Tests: Recent Labs  Lab 12/24/2019 2023 12/29/19 0046 12/29/19 1532 12/30/19 0352  AST 76* 6,313*  --   --   ALT 101* 1,153*  --   --   ALKPHOS 369* 158*  --   --   BILITOT 1.9* 2.4*  --   --   PROT 8.3* 5.4*  --   --   ALBUMIN 4.0 2.6* 2.2* 2.8*   Recent Labs  Lab 12/28/19 0249  LIPASE  1,652*   Recent Labs  Lab 12/28/19 0934 12/29/19 0636  AMMONIA 77* 91*    ABG    Component Value Date/Time   HCO3 31.3 (H) 12/28/2019 2217   TCO2 33 (H) 12/28/2019 2217   O2SAT 50.0 12/28/2019 2217     Coagulation Profile: No results for input(s): INR,  PROTIME in the last 168 hours.  Cardiac Enzymes: No results for input(s): CKTOTAL, CKMB, CKMBINDEX, TROPONINI in the last 168 hours.  HbA1C: Hemoglobin A1C  Date/Time Value Ref Range Status  10/25/2019 08:58 AM 9.7 (A) 4.0 - 5.6 % Final  04/04/2019 11:07 AM 9.8 (A) 4.0 - 5.6 % Final   Hgb A1c MFr Bld  Date/Time Value Ref Range Status  09/05/2017 06:00 PM 9.6 (H) 4.8 - 5.6 % Final    Comment:    (NOTE) Pre diabetes:          5.7%-6.4% Diabetes:              >6.4% Glycemic control for   <7.0% adults with diabetes     CBG: Recent Labs  Lab 12/30/19 0339 12/30/19 0430 12/30/19 0524 12/30/19 0620 12/30/19 0720  GLUCAP 154* 142* 148* 137* 132*    Review of Systems:   Unable to obtain secondary to mental status  Past Medical History  She,  has a past medical history of Anxiety, Asthma, Chronic headache, Chronic lower back pain, Depression, Essential hypertension, Family history of adverse reaction to anesthesia, GERD (gastroesophageal reflux disease), Manic state ([place]), Migraine, Obesity, OSA on CPAP, Osteoarthritis, knee, Pneumonia, Prolonged QT syndrome, PTSD (post-traumatic stress disorder), Rheumatoid arthritis ([place]), Seizures ([place]), Severe needle phobia, and Type II diabetes mellitus ([place]).   Surgical History    Past Surgical History:  Procedure Laterality Date  . TONSILLECTOMY  1994     Social History   reports that she has never smoked. She has never used smokeless tobacco. She reports previous alcohol use. She reports that she does not use drugs.   Family History   Her family history includes Anxiety disorder in her mother; Depression in her mother; Diabetes in her father and paternal uncle; Heart disease in her paternal grandfather; Hypertension in her maternal grandfather, maternal grandmother, paternal grandfather, and paternal grandmother; Hypothyroidism in her mother; Pancreatic cancer in her maternal grandfather.    Lorne Skeens, Medical Student

## 2019-12-30 NOTE — Progress Notes (Signed)
Pharmacy Antibiotic Note  Courtney Zamora is a 34 y.o. female admitted on 12/26/2019 with seizure, DKA, and concern for sepsis and intra-abdominal infection.   Patient now in acute liver and renal failure - no urine output CRRTstarted yesterday  Vanc random this am 18  Height: 5\' 4"  (162.6 cm) Weight: (!) 184.8 kg (407 lb 6.6 oz) IBW/kg (Calculated) : 54.7  Temp (24hrs), Avg:98.4 F (36.9 C), Min:97.8 F (36.6 C), Max:99.4 F (37.4 C)  Recent Labs  Lab January 08, 2020 2004 January 08, 2020 2004 01/04/2020 2115 12/29/2019 2313 12/28/19 0249 12/28/19 0249 12/28/19 02/27/20 12/28/19 0934 12/28/19 1717 12/28/19 2153 12/29/19 0046 12/29/19 0103 12/29/19 0414 12/29/19 1532 12/30/19 0352  WBC 10.7*  --   --   --  14.3*  --   --   --   --   --  9.3  --   --   --   --   CREATININE 2.13*   < >  --   --  2.84*   < > 1.60*   < > 4.69* 5.15*  --  5.74*  --  5.82*  5.87* 3.81*  LATICACIDVEN  --   --  10.8* 10.1*  --   --  >11.0*  --   --   --   --  6.9*  --   --   --   VANCORANDOM  --   --   --   --   --   --   --   --   --   --   --   --  33  --  18   < > = values in this interval not displayed.    Estimated Creatinine Clearance: 34.7 mL/min (A) (by C-G formula based on SCr of 3.81 mg/dL (H)).    Plan: Continue zosyn to 3.375 gm iv q6 h (over 30 min) Resume vanc 1500 q24h Monitor lvls prn Consider dc Tues as cx remain neg  01/01/20, PharmD, BCPS, BCCCP Clinical Pharmacist [phone]  Please check AMION for all [facility] Pharmacy numbers  12/30/2019 8:09 AM

## 2019-12-30 NOTE — Progress Notes (Signed)
Initial Nutrition Assessment  DOCUMENTATION CODES:   Morbid obesity  INTERVENTION:   Tube feeding via OG tube: - Vital High Protein @ 50 ml/hr (1200 ml/day) - Pro-stat 30 ml daily  Tube feeding regimen provides 1300 kcal, 120 grams of protein, and 1003 ml of H2O.   - B-complex with vitamin C  NUTRITION DIAGNOSIS:   Inadequate oral intake related to inability to eat as evidenced by NPO status.  GOAL:   Provide needs based on ASPEN/SCCM guidelines  MONITOR:   Vent status, Labs, Weight trends, TF tolerance, I & O's  REASON FOR ASSESSMENT:   Ventilator, Consult Enteral/tube feeding initiation and management  ASSESSMENT:   34 year old female who presented on 6/11 with possible seizures, AMS, elevated blood glucose. PMH of rheumatoid arthritis, T2DM, HTN. Admitted with DKA, septic shock, acute hypoxic respiratory failure, possible pancreatitis, AKI.   6/12 - intubation 6/13 - CRRT initiated  Discussed pt with RN and during ICU rounds. Epinephrine now off and weaning norepinephrine and vasopressin.  OG tube in place. Pt remains on CRRT.  Current TF orders: Vital High Protein @ 40 ml/hr, Pro-stat 30 ml BID, free water 200 ml q 4 hours  Patient is currently intubated on ventilator support MV: 16.6 L/min Temp (24hrs), Avg:98.2 F (36.8 C), Min:97.8 F (36.6 C), Max:98.4 F (36.9 C) BP (cuff): 105/60 MAP (cuff): 74  Drips: Fentanyl: 5 ml/hr Insulin: 5.5 ml/hr Levophed: 7.5 ml/hr (weaning) Vasopressin: 7.5 ml/hr (weaning)  Medications reviewed and include: colace, protonix, miralax, IV albumin 25 grams q 6 hours, IV calcium gluconate 1 gram once, IV abx, IV thiamine 500 mg q 8 hours  Labs reviewed: BUN 28, creatinine 3.81, phosphorus 2.3, lactic acid 2.9 CBG's: 132-197 x 12 hours  UOP: 35 ml x 24 hours OGT output: 550 ml x 24 hours CRRT UF: 1617 ml x 24 hours I/O's: +13.9 L since admit  NUTRITION - FOCUSED PHYSICAL EXAM:    Most Recent Value  Orbital  Region No depletion  Upper Arm Region No depletion  Thoracic and Lumbar Region No depletion  Buccal Region Unable to assess  Temple Region No depletion  Clavicle Bone Region No depletion  Clavicle and Acromion Bone Region No depletion  Scapular Bone Region Unable to assess  Dorsal Hand No depletion  Patellar Region No depletion  Anterior Thigh Region No depletion  Posterior Calf Region No depletion  Edema (RD Assessment) Mild  [generalized]  Hair Reviewed  Eyes Unable to assess  Mouth Unable to assess  Skin Reviewed  Nails Reviewed       Diet Order:   Diet Order            Diet NPO time specified  Diet effective now                 EDUCATION NEEDS:   No education needs have been identified at this time  Skin:  Skin Assessment: Reviewed RN Assessment  Last BM:  no documented BM  Height:   Ht Readings from Last 1 Encounters:  12/28/19 5\' 4"  (1.626 m)    Weight:   Wt Readings from Last 1 Encounters:  12/30/19 (!) 184.8 kg    Ideal Body Weight:  54.5 kg  BMI:  Body mass index is 69.93 kg/m.  Estimated Nutritional Needs:   Kcal:  01/01/20  Protein:  109-136 grams  Fluid:  >/= 1.2 L    [id number], MS, RD, LDN Inpatient Clinical Dietitian Pager: [phone] Weekend/After Hours: [phone]

## 2019-12-30 NOTE — Progress Notes (Signed)
[facility] ROUNDING NOTE   Subjective:   [name] diabetes rheumatoid arthritis chronic CellCept possible seizure disorder presented 12/29/2019 with altered mental status seizures and diabetic ketoacidosis.  She developed acute kidney injury  and anuric renal failure.  Lactic acid level was 10.8.  Her baseline creatinine appears to be in the normal range.  The etiology of her renal failure appears to be secondary to ischemic acute tubular necrosis in setting of septic shock and diabetes ketoacidosis.  Renal ultrasound showed normal right and left kidney with an unremarkable CT scan.  CRRT was initiated 12/29/2019.  Blood pressure 134 74 pulse 103 temperature 98.4 O2 sats 9 9% FiO2 60%  Sodium 139 potassium 3.5 chloride 102 CO2 21 BUN 28 creatinine 3.8 glucose 171 calcium 6.9 phosphorus 2.3 albumin 2.8  Protonix 40 mg daily IV epinephrine IV fentanyl IV insulin IV norepinephrine IV Keppra IV Zosyn 3.375 g every 6 hours IV Vanco IV vasopressin  Objective:  Vital signs in last 24 hours:  Temp:  [97.8 F (36.6 C)-100.2 F (37.9 C)] 98.4 F (36.9 C) (06/14 0400) Pulse Rate:  [93-116] 102 (06/14 0700) Resp:  [22-42] 36 (06/14 0700) BP: (75-143)/(23-92) 128/73 (06/14 0700) SpO2:  [94 %-100 %] 98 % (06/14 0700) FiO2 (%):  [60 %-80 %] 60 % (06/14 0348) Weight:  [184.8 kg] 184.8 kg (06/14 0436)  Weight change:  Filed Weights   12/19/2019 2341 12/28/19 0100 12/30/19 0436  Weight: (!) 184.6 kg (!) 168.2 kg (!) 184.8 kg    Intake/Output: I/O last 3 completed shifts: In: 10617.2 [I.V.:6469.8; Other:110; NG/GT:1280; IV Piggyback:2757.3] Out: 3002 [Urine:35; Emesis/NG output:1350; OVFIE:3329]   Intake/Output this shift:  No intake/output data recorded.     General exam: Ill-looking female sedated and intubated Respiratory system: Coarse mechanical breath sound bilateral Cardiovascular system: S1 & S2 heard, RRR.  Edema present in all  extremities. Gastrointestinal system: Abdomen is distended, soft. Central nervous system: Sedated and not responding Extremities: Edematous, no cyanosis or clubbing Skin: No rashes, lesions or ulcers Psychiatry: Unable to assess as patient is sedated.   Basic Metabolic Panel: Recent Labs  Lab 01/13/2020 2053 01/06/2020 2059 12/28/19 1717 12/28/19 1717 12/28/19 2153 12/28/19 2153 12/28/19 2217 12/29/19 0046 12/29/19 0103 12/29/19 1532 12/30/19 0352  NA  --    < > 149*   < > 148*  --  148*  --  148* 144  145 139  K  --    < > 3.0*   < > 2.8*   < > 2.8* 2.8* 2.8* 3.2*  3.2* 3.5  CL  --    < > 100  --  101  --   --   --  101 102  104 102  CO2  --    < > 30  --  31  --   --   --  24 20*  20* 21*  GLUCOSE  --    < > 239*  --  270*  --   --   --  204* 234*  235* 171*  BUN  --    < > 35*  --  36*  --   --   --  37* 41*  41* 28*  CREATININE  --    < > 4.69*  --  5.15*  --   --   --  5.74* 5.82*  5.87* 3.81*  CALCIUM  --    < > 7.6*   < > 7.3*   < >  --   --  7.0* 6.4*  6.5* 6.9*  MG 4.3*  --   --   --   --   --   --  2.1  --   --  1.8  PHOS  --   --   --   --   --   --   --   --   --  3.3 2.3*   < > = values in this interval not displayed.    Liver Function Tests: Recent Labs  Lab 12/31/2019 2023 12/29/19 0046 12/29/19 1532 12/30/19 0352  AST 76* 6,313*  --   --   ALT 101* 1,153*  --   --   ALKPHOS 369* 158*  --   --   BILITOT 1.9* 2.4*  --   --   PROT 8.3* 5.4*  --   --   ALBUMIN 4.0 2.6* 2.2* 2.8*   Recent Labs  Lab 12/28/19 0249  LIPASE 1,652*   Recent Labs  Lab 12/28/19 0934 12/29/19 0636  AMMONIA 77* 91*    CBC: Recent Labs  Lab 12/26/2019 2004 01/02/2020 2059 12/28/19 0249 12/28/19 2217 12/29/19 0046  WBC 10.7*  --  14.3*  --  9.3  NEUTROABS 7.9*  --   --   --  6.2  HGB 18.9* 21.8* 17.0* 17.3* 15.9*  HCT 67.2* 64.0* 56.8* 51.0* 56.4*  MCV 104.0*  --  98.6  --  103.5*  PLT 250  --  201  --  113*    Cardiac Enzymes: No results for input(s):  CKTOTAL, CKMB, CKMBINDEX, TROPONINI in the last 168 hours.  BNP: Invalid input(s): POCBNP  CBG: Recent Labs  Lab 12/30/19 0339 12/30/19 0430 12/30/19 0524 12/30/19 0620 12/30/19 0720  GLUCAP 154* 142* 148* 137* 132*    Microbiology: Results for orders placed or performed during the hospital encounter of 12/18/2019  SARS Coronavirus 2 by RT PCR (hospital order, performed in [facility] hospital lab) Nasopharyngeal Nasopharyngeal Swab     Status: None   Collection Time: 01/14/2020  9:38 PM   Specimen: Nasopharyngeal Swab  Result Value Ref Range Status   SARS Coronavirus 2 NEGATIVE NEGATIVE Final    Comment: (NOTE) SARS-CoV-2 target nucleic acids are NOT DETECTED.  The SARS-CoV-2 RNA is generally detectable in upper and lower respiratory specimens during the acute phase of infection. The lowest concentration of SARS-CoV-2 viral copies this assay can detect is 250 copies / mL. A negative result does not preclude SARS-CoV-2 infection and should not be used as the sole basis for treatment or other patient management decisions.  A negative result may occur with improper specimen collection / handling, submission of specimen other than nasopharyngeal swab, presence of viral mutation(s) within the areas targeted by this assay, and inadequate number of viral copies (<250 copies / mL). A negative result must be combined with clinical observations, patient history, and epidemiological information.  Fact Sheet for Patients:   StrictlyIdeas.no  Fact Sheet for Healthcare Providers: BankingDealers.co.za  This test is not yet approved or  cleared by the [place] FDA and has been authorized for detection and/or diagnosis of SARS-CoV-2 by FDA under an Emergency Use Authorization (EUA).  This EUA will remain in effect (meaning this test can be used) for the duration of the COVID-19 declaration under Section 564(b)(1) of the Act, 21  U.S.C. section 360bbb-3(b)(1), unless the authorization is terminated or revoked sooner.  Performed at [facility], [place] [address] [place], [place] [address]   MRSA PCR Screening  Status: None   Collection Time: 12/28/19  1:07 AM   Specimen: Nasal Mucosa; Nasopharyngeal  Result Value Ref Range Status   MRSA by PCR NEGATIVE NEGATIVE Final    Comment:        The GeneXpert MRSA Assay (FDA approved for NASAL specimens only), is one component of a comprehensive MRSA colonization surveillance program. It is not intended to diagnose MRSA infection nor to guide or monitor treatment for MRSA infections. Performed at [facility], [place] [address], [place], [place] [address]   Culture, Urine     Status: None   Collection Time: 12/28/19  1:15 AM   Specimen: Urine, Catheterized  Result Value Ref Range Status   Specimen Description URINE, CATHETERIZED  Final   Special Requests NONE  Final   Culture   Final    NO GROWTH Performed at [place] [address], [place], [place] [address]    Report Status 12/29/2019 FINAL  Final  Culture, blood (routine x 2)     Status: None (Preliminary result)   Collection Time: 12/28/19  2:49 AM   Specimen: BLOOD  Result Value Ref Range Status   Specimen Description BLOOD LEFT ANTECUBITAL  Final   Special Requests   Final    BOTTLES DRAWN AEROBIC ONLY Blood Culture adequate volume   Culture   Final    NO GROWTH 1 DAY Performed at [facility] [place] [facility], [place] [address], [place], [place] [address]    Report Status PENDING  Incomplete  Culture, blood (routine x 2)     Status: None (Preliminary result)   Collection Time: 12/28/19  2:49 AM   Specimen: BLOOD RIGHT HAND  Result Value Ref Range Status   Specimen Description BLOOD RIGHT HAND  Final   Special Requests   Final    BOTTLES DRAWN AEROBIC ONLY Blood Culture adequate volume   Culture   Final    NO GROWTH 1 DAY Performed at [facility], [place] [address]  [place], [place] [address]    Report Status PENDING  Incomplete    Coagulation Studies: No results for input(s): LABPROT, INR in the last 72 hours.  Urinalysis: Recent Labs    12/28/19 0123  COLORURINE AMBER*  LABSPEC 1.025  PHURINE 5.0  GLUCOSEU >=500*  HGBUR MODERATE*  BILIRUBINUR NEGATIVE  KETONESUR NEGATIVE  PROTEINUR 100*  NITRITE NEGATIVE  LEUKOCYTESUR NEGATIVE      Imaging: US RENAL  Result Date: 12/28/2019 CLINICAL DATA:  Elevated BUN and creatinine EXAM: RENAL / URINARY TRACT ULTRASOUND COMPLETE COMPARISON:  None. FINDINGS: Right Kidney: Renal measurements: 10.8 x 5.8 x 5.6 cm. = volume: 182 mL . Echogenicity within normal limits. No mass or hydronephrosis visualized. Left Kidney: Not visualized due to patient body habitus. Bladder: Decompressed by Foley catheter. Other: None. IMPRESSION: Normal-appearing right kidney. Left kidney is not visualized due the patient's body habitus. It was within normal limits on recent CT examination. Electronically Signed   By: Inez Catalina M.D.   On: 12/28/2019 22:45   DG CHEST PORT 1 VIEW  Result Date: 12/29/2019 CLINICAL DATA:  Central line placement EXAM: PORTABLE CHEST 1 VIEW COMPARISON:  12/28/2019 FINDINGS: Interval placement of right neck vascular catheter, tip position difficult to precisely locate on underpenetrated examination although over the right atrium. Interval placement of left neck vascular catheter, tip projecting over the superior vena cava. Support apparatus is otherwise unchanged, including endotracheal tube and esophagogastric tube. Unchanged mild, diffuse interstitial pulmonary opacity. Cardiomegaly. IMPRESSION: 1.  Interval placement of right neck vascular catheter, tip position difficult to precisely locate on underpenetrated examination although over the right atrium. 2. Interval placement of left neck vascular catheter, tip projecting over the superior vena cava. 3. Unchanged mild, diffuse interstitial pulmonary opacity,  likely edema in the setting of cardiomegaly. Electronically Signed   By: Eddie Candle M.D.   On: 12/29/2019 14:40   DG CHEST PORT 1 VIEW  Result Date: 12/28/2019 CLINICAL DATA:  Endotracheal and OG tube placement. EXAM: PORTABLE CHEST 1 VIEW COMPARISON:  01/07/2020 FINDINGS: Grossly unchanged enlarged cardiac silhouette and mediastinal contours. Endotracheal tube overlies the tracheal air column with tip approximately 2.5 cm above the carina. Enteric tube tip and side port project below the left hemidiaphragm. Interval placement of right jugular approach central venous catheter with tip projected over the superior aspect of the right atrium. No pneumothorax. Pulmonary vasculature remains indistinct with cephalization of flow. There is mild elevation/eventration of the right hemidiaphragm. Grossly unchanged perihilar heterogeneous opacities. No pleural effusion. No acute osseous abnormalities. IMPRESSION: 1. Support apparatus as above.  No pneumothorax. 2. Similar findings of cardiomegaly, pulmonary edema and perihilar opacities, atelectasis versus infiltrate/aspirate. Electronically Signed   By: Sandi Mariscal M.D.   On: 12/28/2019 10:22   Overnight EEG with video  Result Date: 12/28/2019 Lora Havens, MD     12/29/2019  9:21 AM Patient Name: Courtney Zamora MRN: [id number] Epilepsy Attending: Lora Havens Referring Physician/Provider: Dr Karena Addison Aroor Duration: 12/28/2019 0019 to 12/28/2019 2536, 12/28/2019 1201 to 12/29/2019 0400  Patient history: 34 year old female with a history of morbid obesity, RA, seizures not on any medications, poorly controlled diabetes who presented with left upper extremity abnormal jerking and ams. EEG to evaluate for seizure  Level of alertness:  lethargic  AEDs during EEG study: LEV  Technical aspects: This EEG study was done with scalp electrodes positioned according to the 10-20 International system of electrode placement. Electrical activity was acquired at a  sampling rate of '500Hz'$  and reviewed with a high frequency filter of '70Hz'$  and a low frequency filter of '1Hz'$ . EEG data were recorded continuously and digitally stored.  Description: EEG showed continuous generalized 3 to 6 Hz theta-delta slowing.  Hyperventilation and photic stimulation were not performed.    Of note, eeg was technically difficult due to significant ekg artifact, movement artifact as well as electrode artifact.  ABNORMALITY -Continuous slow, generalized  IMPRESSION: This technically difficult study is suggestive of severe diffuse encephalopathy, nonspecific etiology. No seizures or epileptiform discharges were seen throughout the recording.   Lora Havens   ECHOCARDIOGRAM COMPLETE  Result Date: 12/29/2019    ECHOCARDIOGRAM REPORT   Patient Name:   Courtney Zamora [facility] Date of Exam: 12/29/2019 Medical Rec #:  [id number]       Height:       64.0 in Accession #:    [id number]      Weight:       370.8 lb Date of Birth:  1986-02-06       BSA:          2.543 m Patient Age:    34 years        BP:           81/64 mmHg Patient Gender: F               HR:           113 bpm. Exam Location:  Inpatient Procedure: 2D Echo, Color Doppler, Cardiac Doppler and Intracardiac  Opacification Agent Indications:    R94.31 Abnormal EKG  History:        Patient has prior history of Echocardiogram examinations, most                 recent 04/09/2018. Risk Factors:Hypertension, Diabetes and Sleep                 Apnea.  Sonographer:    Raquel Sarna Senior RDCS Referring Phys: [id number] Francesca Jewett  Sonographer Comments: Very technically difficult study due to patient body habitus. Scanned supine on artificial respirator. IMPRESSIONS  1. Left ventricular ejection fraction, by estimation, is 55%. The left ventricle has normal function. The left ventricle has no regional wall motion abnormalities. Left ventricular diastolic parameters were normal.  2. Right ventricular systolic function was not well visualized. The  right ventricular size is not well visualized.  3. Some calcification on the atrial side of the posterior mitral leaflet Not well visualized consider f/u imaging when patient off vent and able to be positioned non supine . The mitral valve is normal in structure. No evidence of mitral valve regurgitation. No evidence of mitral stenosis.  4. The aortic valve was not well visualized. Aortic valve regurgitation is not visualized. Mild aortic valve sclerosis is present, with no evidence of aortic valve stenosis.  5. The inferior vena cava is normal in size with greater than 50% respiratory variability, suggesting right atrial pressure of 3 mmHg. FINDINGS  Left Ventricle: Left ventricular ejection fraction, by estimation, is 55%. The left ventricle has normal function. The left ventricle has no regional wall motion abnormalities. Definity contrast agent was given IV to delineate the left ventricular endocardial borders. The left ventricular internal cavity size was normal in size. There is no left ventricular hypertrophy. Left ventricular diastolic parameters were normal. Right Ventricle: The right ventricular size is not well visualized. Right vetricular wall thickness was not assessed. Right ventricular systolic function was not well visualized. Left Atrium: Left atrial size was normal in size. Right Atrium: Right atrial size was normal in size. Pericardium: There is no evidence of pericardial effusion. Mitral Valve: Some calcification on the atrial side of the posterior mitral leaflet Not well visualized consider f/u imaging when patient off vent and able to be positioned non supine. The mitral valve is normal in structure. There is mild thickening of the mitral valve leaflet(s). There is mild calcification of the mitral valve leaflet(s). Normal mobility of the mitral valve leaflets. Mild mitral annular calcification. No evidence of mitral valve regurgitation. No evidence of mitral valve stenosis. Tricuspid Valve: The  tricuspid valve is normal in structure. Tricuspid valve regurgitation is not demonstrated. No evidence of tricuspid stenosis. Aortic Valve: The aortic valve was not well visualized. Aortic valve regurgitation is not visualized. Mild aortic valve sclerosis is present, with no evidence of aortic valve stenosis. Pulmonic Valve: The pulmonic valve was normal in structure. Pulmonic valve regurgitation is not visualized. No evidence of pulmonic stenosis. Aorta: The aortic root is normal in size and structure. Venous: The inferior vena cava was not well visualized. The inferior vena cava is normal in size with greater than 50% respiratory variability, suggesting right atrial pressure of 3 mmHg. IAS/Shunts: No atrial level shunt detected by color flow Doppler.  LEFT VENTRICLE PLAX 2D LVIDd:         3.60 cm  Diastology LVIDs:         2.50 cm  LV e' lateral:   8.49 cm/s LV PW:  1.00 cm  LV E/e' lateral: 4.7 LV IVS:        1.40 cm  LV e' medial:    5.22 cm/s LVOT diam:     1.90 cm  LV E/e' medial:  7.7 LV SV:         37 LV SV Index:   15 LVOT Area:     2.84 cm  RIGHT VENTRICLE RV S prime:     11.40 cm/s TAPSE (M-mode): 1.4 cm LEFT ATRIUM           Index       RIGHT ATRIUM          Index LA diam:      3.00 cm 1.18 cm/m  RA Area:     9.21 cm LA Vol (A4C): 36.0 ml 14.15 ml/m RA Volume:   16.60 ml 6.53 ml/m  AORTIC VALVE LVOT Vmax:   76.23 cm/s LVOT Vmean:  54.733 cm/s LVOT VTI:    0.131 m  AORTA Ao Root diam: 2.80 cm Ao Asc diam:  2.60 cm MITRAL VALVE MV Area (PHT): 2.66 cm    SHUNTS MV Decel Time: 285 msec    Systemic VTI:  0.13 m MV E velocity: 40.10 cm/s  Systemic Diam: 1.90 cm MV A velocity: 44.20 cm/s MV E/A ratio:  0.91 Jenkins Rouge MD Electronically signed by Jenkins Rouge MD Signature Date/Time: 12/29/2019/10:47:24 AM    Final      Medications:   .  prismasol BGK 4/2.5 500 mL/hr at 12/30/19 0039  .  prismasol BGK 4/2.5 300 mL/hr at 12/29/19 1430  . sodium chloride Stopped (12/28/19 1117)  . sodium  chloride Stopped (12/29/19 0755)  . albumin human 25 g (12/30/19 0631)  . dextrose 5 % and 0.45% NaCl 50 mL/hr at 12/30/19 0600  . EPINEPHrine 8 mg in dextrose 5% 250 mL infusion (32 mcg/mL) 10 mcg/min (12/30/19 5188)  . fentaNYL infusion INTRAVENOUS 50 mcg/hr (12/30/19 0600)  . heparin    . insulin 11 Units/hr (12/30/19 4166)  . levETIRAcetam Stopped (12/29/19 2309)  . norepinephrine (LEVOPHED) Adult infusion 20 mcg/min (12/30/19 0630)  . piperacillin-tazobactam Stopped (12/30/19 0533)  . prismasol BGK 4/2.5 1,500 mL/hr at 12/30/19 0403  . sodium chloride    . thiamine injection 500 mg (12/30/19 0603)  . vasopressin (PITRESSIN) infusion - *FOR SHOCK* 0.04 Units/min (12/30/19 0600)   . chlorhexidine gluconate (MEDLINE KIT)  15 mL Mouth Rinse BID  . Chlorhexidine Gluconate Cloth  6 each Topical Q0600  . docusate  100 mg Per Tube BID  . free water  200 mL Per Tube Q4H  . heparin  5,000 Units Subcutaneous Q8H  . hydrocortisone sod succinate (SOLU-CORTEF) inj  50 mg Intravenous Q6H  . mouth rinse  15 mL Mouth Rinse 10 times per day  . pantoprazole sodium  40 mg Per Tube Daily  . polyethylene glycol  17 g Per Tube Daily  . sodium chloride flush  10-40 mL Intracatheter Q12H  . vancomycin variable dose per unstable renal function (pharmacist dosing)   Does not apply See admin instructions   sodium chloride, dextrose, fentaNYL, heparin, heparin, midazolam, midazolam, sodium chloride flush  Assessment/ Plan:   Acute kidney injury secondary ischemic ATN in setting of septic shock diabetic ketoacidosis anuric.  CT scan and ultrasound showed no evidence of hydronephrosis CRRT initiated 12/29/2019.  We will continue with no change  Hypotension/shock continues on 3 pressors  Ventilator dependent respiratory failure  Lactic acidosis secondary to septic shock  Diabetes  mellitus with ketoacidosis.  IV insulin  Encephalopathy generalized continuous slowing and triphasic waves MRI of brain  ordered with continuous EEG management continues on Keppra appreciate assistance from neurology  Septic shock IV vancomycin and Zosyn source appears to be unclear    LOS: 3 Sherril Croon '@TODAY''@7'$ :25 AM

## 2019-12-30 NOTE — Progress Notes (Signed)
NEUROLOGY PROGRESS NOTE  Subjective: Patient intubated, on fentanyl but able to follow commands and answer questions by nodding her head.  Exam: Vitals:   12/30/19 1000 12/30/19 1015  BP: 129/70 115/78  Pulse: (!) 102 (!) 106  Resp: (!) 40 (!) 41  Temp:    SpO2: 98% 99%    Neuro:  Mental Status: Alert, able to follow commands to the best of her ability.  Intubated thus nonvocal. Cranial Nerves: II:  Visual fields grossly normal,  III,IV, VI: ptosis not present, extra-ocular motions intact bilaterally pupils equal, round, reactive to light and accommodation V,VII:facial light touch sensation normal bilaterally VIII: hearing normal bilaterally  Motor: Bilateral grip slightly weak.  Right leg 2/5, left thigh 2/5, able to dorsiflex and plantarflex on the right unable to dorsiflex or plantarflex on the left Deep Tendon Reflexes: None Plantars: Mute     Medications:  Scheduled: . chlorhexidine gluconate (MEDLINE KIT)  15 mL Mouth Rinse BID  . Chlorhexidine Gluconate Cloth  6 each Topical Q0600  . docusate  100 mg Per Tube BID  . feeding supplement (PRO-STAT SUGAR FREE 64)  30 mL Per Tube BID  . feeding supplement (VITAL HIGH PROTEIN)  1,000 mL Per Tube Q24H  . free water  200 mL Per Tube Q4H  . heparin  5,000 Units Subcutaneous Q8H  . levETIRAcetam  500 mg Per Tube BID  . mouth rinse  15 mL Mouth Rinse 10 times per day  . pantoprazole sodium  40 mg Per Tube Daily  . polyethylene glycol  17 g Per Tube Daily  . sodium chloride flush  10-40 mL Intracatheter Q12H   Continuous: .  prismasol BGK 4/2.5 500 mL/hr at 12/30/19 0039  .  prismasol BGK 4/2.5 300 mL/hr at 12/30/19 0737  . sodium chloride Stopped (12/28/19 1117)  . sodium chloride Stopped (12/29/19 0755)  . albumin human Stopped (12/30/19 0739)  . calcium gluconate    . fentaNYL infusion INTRAVENOUS 50 mcg/hr (12/30/19 1000)  . heparin    . insulin 12 mL/hr at 12/30/19 1000  . norepinephrine (LEVOPHED) Adult  infusion 14 mcg/min (12/30/19 1000)  . piperacillin-tazobactam Stopped (12/30/19 0533)  . prismasol BGK 4/2.5 1,500 mL/hr at 12/30/19 0751  . sodium chloride    . thiamine injection Stopped (12/30/19 0037)  . vancomycin 150 mL/hr at 12/30/19 1000  . vasopressin (PITRESSIN) infusion - *FOR SHOCK* 0.03 Units/min (12/30/19 1000)    Pertinent Labs/Diagnostics: -Glucose 146   Etta Quill PA-C [facility] [phone]  Assessment:  34 year old female with left-sided twitching and weakness in the setting of severe hyperglycemia.  She did show frequent sharply contoured waves which is most consistent with triphasics, but there is concern it may represent some degree of irritability.  Currently she is on maximum dose of Keppra for her renal function.  Still awaiting MRI of brain.  On exam today patient is awake, intubated, able to follow commands to the best of her ability while on fentanyl.  Recommendations: -MRI brain -Continue Keppra for now -We will continue to follow    12/30/2019, 10:32 AM

## 2019-12-31 ENCOUNTER — Inpatient Hospital Stay (HOSPITAL_COMMUNITY): Payer: Medicaid Other

## 2019-12-31 LAB — COMPREHENSIVE METABOLIC PANEL
ALT: 353 U/L — ABNORMAL HIGH (ref 0–44)
AST: 473 U/L — ABNORMAL HIGH (ref 15–41)
Albumin: 3.2 g/dL — ABNORMAL LOW (ref 3.5–5.0)
Alkaline Phosphatase: 256 U/L — ABNORMAL HIGH (ref 38–126)
Anion gap: 16 — ABNORMAL HIGH (ref 5–15)
BUN: 24 mg/dL — ABNORMAL HIGH (ref 6–20)
CO2: 21 mmol/L — ABNORMAL LOW (ref 22–32)
Calcium: 7.7 mg/dL — ABNORMAL LOW (ref 8.9–10.3)
Chloride: 99 mmol/L (ref 98–111)
Creatinine, Ser: 2.62 mg/dL — ABNORMAL HIGH (ref 0.44–1.00)
GFR calc Af Amer: 26 mL/min — ABNORMAL LOW (ref >60)
GFR calc non Af Amer: 23 mL/min — ABNORMAL LOW (ref >60)
Glucose, Bld: 200 mg/dL — ABNORMAL HIGH (ref 70–99)
Potassium: 3.2 mmol/L — ABNORMAL LOW (ref 3.5–5.1)
Sodium: 136 mmol/L (ref 135–145)
Total Bilirubin: 11.6 mg/dL — ABNORMAL HIGH (ref 0.3–1.2)
Total Protein: 5.9 g/dL — ABNORMAL LOW (ref 6.5–8.1)

## 2019-12-31 LAB — RENAL FUNCTION PANEL
Albumin: 3.4 g/dL — ABNORMAL LOW (ref 3.5–5.0)
Anion gap: 16 — ABNORMAL HIGH (ref 5–15)
BUN: 23 mg/dL — ABNORMAL HIGH (ref 6–20)
CO2: 21 mmol/L — ABNORMAL LOW (ref 22–32)
Calcium: 7.9 mg/dL — ABNORMAL LOW (ref 8.9–10.3)
Chloride: 98 mmol/L (ref 98–111)
Creatinine, Ser: 2.39 mg/dL — ABNORMAL HIGH (ref 0.44–1.00)
GFR calc Af Amer: 29 mL/min — ABNORMAL LOW (ref >60)
GFR calc non Af Amer: 25 mL/min — ABNORMAL LOW (ref >60)
Glucose, Bld: 228 mg/dL — ABNORMAL HIGH (ref 70–99)
Phosphorus: 1.7 mg/dL — ABNORMAL LOW (ref 2.5–4.6)
Potassium: 3.5 mmol/L (ref 3.5–5.1)
Sodium: 135 mmol/L (ref 135–145)

## 2019-12-31 LAB — GLUCOSE, CAPILLARY
Glucose-Capillary: 160 mg/dL — ABNORMAL HIGH (ref 70–99)
Glucose-Capillary: 173 mg/dL — ABNORMAL HIGH (ref 70–99)
Glucose-Capillary: 173 mg/dL — ABNORMAL HIGH (ref 70–99)
Glucose-Capillary: 161 mg/dL — ABNORMAL HIGH (ref 70–99)
Glucose-Capillary: 180 mg/dL — ABNORMAL HIGH (ref 70–99)
Glucose-Capillary: 202 mg/dL — ABNORMAL HIGH (ref 70–99)
Glucose-Capillary: 183 mg/dL — ABNORMAL HIGH (ref 70–99)
Glucose-Capillary: 204 mg/dL — ABNORMAL HIGH (ref 70–99)
Glucose-Capillary: 181 mg/dL — ABNORMAL HIGH (ref 70–99)
Glucose-Capillary: 218 mg/dL — ABNORMAL HIGH (ref 70–99)
Glucose-Capillary: 184 mg/dL — ABNORMAL HIGH (ref 70–99)
Glucose-Capillary: 205 mg/dL — ABNORMAL HIGH (ref 70–99)
Glucose-Capillary: 181 mg/dL — ABNORMAL HIGH (ref 70–99)
Glucose-Capillary: 180 mg/dL — ABNORMAL HIGH (ref 70–99)
Glucose-Capillary: 171 mg/dL — ABNORMAL HIGH (ref 70–99)
Glucose-Capillary: 141 mg/dL — ABNORMAL HIGH (ref 70–99)

## 2019-12-31 LAB — PHOSPHORUS: Phosphorus: 1.3 mg/dL — ABNORMAL LOW (ref 2.5–4.6)

## 2019-12-31 LAB — BLOOD GAS, VENOUS
Acid-base deficit: 2.5 mmol/L — ABNORMAL HIGH (ref 0.0–2.0)
Bicarbonate: 22 mmol/L (ref 20.0–28.0)
FIO2: 40
O2 Saturation: 64.1 %
Patient temperature: 37
pCO2, Ven: 39.7 mmHg — ABNORMAL LOW (ref 44.0–60.0)
pH, Ven: 7.363 (ref 7.250–7.430)
pO2, Ven: 32.5 mmHg (ref 32.0–45.0)

## 2019-12-31 LAB — LACTIC ACID, PLASMA: Lactic Acid, Venous: 2 mmol/L (ref 0.5–1.9)

## 2019-12-31 LAB — MAGNESIUM
Magnesium: 2.4 mg/dL (ref 1.7–2.4)
Magnesium: 2.4 mg/dL (ref 1.7–2.4)

## 2019-12-31 LAB — HEMOGLOBIN A1C
Hgb A1c MFr Bld: >15.5 % — ABNORMAL HIGH (ref 4.8–5.6)
Mean Plasma Glucose: >398 mg/dL

## 2019-12-31 MED ORDER — POTASSIUM CHLORIDE 10 MEQ/50ML IV SOLN
10.0000 meq | INTRAVENOUS | Status: AC
  Administered 2019-12-31 (×3): 10 meq via INTRAVENOUS
  Filled 2019-12-31 (×3): qty 50

## 2019-12-31 MED ORDER — SODIUM PHOSPHATES 45 MMOLE/15ML IV SOLN
30.0000 mmol | Freq: Once | INTRAVENOUS | Status: AC
  Administered 2019-12-31: 30 mmol via INTRAVENOUS
  Filled 2019-12-31: qty 10

## 2019-12-31 MED ORDER — LEVETIRACETAM 100 MG/ML PO SOLN
1000.0000 mg | Freq: Two times a day (BID) | ORAL | Status: DC
  Administered 2019-12-31 – 2020-01-08 (×16): 1000 mg
  Filled 2019-12-31 (×16): qty 10

## 2019-12-31 NOTE — Progress Notes (Addendum)
NEUROLOGY PROGRESS NOTE  Subjective: Remains intubated but is able to have conversations with nodding yes and no appropriately. Is able to point to parts of the body where it hurts upon questioning. Patient stating that she has some leg pain and also some mild chest pain other than that she is feeling well.  Exam: Vitals:   12/31/19 0815 12/31/19 0830  BP: (!) 86/61 (!) 104/55  Pulse: (!) 108 (!) 107  Resp: (!) 45 (!) 45  Temp:    SpO2: 99% 99%   Neuro:  Mental Status: Alert, intubated at this time.  Able to follow commands.  Communicates with head nodding and using hands. Cranial Nerves: III,IV, VI: ptosis not present, extra-ocular motions intact bilaterally pupils equal, round, reactive to light and accommodation VIII: hearing normal bilaterally Motor: Able to lift bilateral upper extremities mildly antigravity.  Unable to lift lower extremities antigravity but does have  2/5 strength.  Does have a left foot drop.  Medications:  Scheduled: . B-complex with vitamin C  1 tablet Per Tube Daily  . chlorhexidine gluconate (MEDLINE KIT)  15 mL Mouth Rinse BID  . Chlorhexidine Gluconate Cloth  6 each Topical Q0600  . docusate  100 mg Per Tube BID  . feeding supplement (PRO-STAT SUGAR FREE 64)  30 mL Per Tube Daily  . free water  200 mL Per Tube Q4H  . heparin  5,000 Units Subcutaneous Q8H  . levETIRAcetam  500 mg Per Tube BID  . mouth rinse  15 mL Mouth Rinse 10 times per day  . pantoprazole sodium  40 mg Per Tube Daily  . polyethylene glycol  17 g Per Tube Daily  . sodium chloride flush  10-40 mL Intracatheter Q12H   Continuous: .  prismasol BGK 4/2.5 500 mL/hr at 12/31/19 0720  .  prismasol BGK 4/2.5 300 mL/hr at 12/31/19 0024  . sodium chloride Stopped (12/28/19 1117)  . sodium chloride Stopped (12/29/19 0755)  . albumin human Stopped (12/31/19 0715)  . feeding supplement (VITAL HIGH PROTEIN) 50 mL/hr at 12/30/19 1210  . fentaNYL infusion INTRAVENOUS 50 mcg/hr (12/31/19  0800)  . heparin    . insulin 5.5 mL/hr at 12/31/19 0800  . norepinephrine (LEVOPHED) Adult infusion 10 mcg/min (12/31/19 0800)  . piperacillin-tazobactam Stopped (12/31/19 1601)  . potassium chloride 50 mL/hr at 12/31/19 0800  . prismasol BGK 4/2.5 1,500 mL/hr at 12/31/19 0932  . sodium chloride    . sodium phosphate  Dextrose 5% IVPB 43 mL/hr at 12/31/19 0800  . vancomycin Stopped (12/30/19 1159)  . vasopressin (PITRESSIN) infusion - *FOR SHOCK* Stopped (12/30/19 1325)   TFT:DDUKGU chloride, dextrose, fentaNYL, heparin, heparin, midazolam, midazolam, sodium chloride flush  Pertinent Labs/Diagnostics: Blood glucose 173 BUN 24 Creatinine 2.62 trending down. Albumin 3.2 AST 473 ALT 353 Alk phos 256  Etta Quill PA-C [facility] [phone]  Assessment:  34 year old female with left-sided twitching and weakness in the setting of severe hyperglycemia.  On EEG she did show frequent sharply contoured waves which is most consistent with triphasics, and there was a concern that it may represent some degree of irritability.  Currently she is on maximum dose of Keppra for her renal function.  Still awaiting MRI brain.  Exam again today shows awake, intubated, able to follow commands to the best of her ability.  Recommendations: -MRI brain -Continue Keppra for now -Continue seizure precautions -Will continue to follow for MRI findings -Correction of toxic metabolic derangements per primary team as you are.   12/31/2019, 8:55  AM  Attending addendum Patient seen and examined Patient admitted with severe hyperglycemia and left-sided twitching and weakness.  EEG showed frequent sharply contoured triphasic waves suggestive of encephalopathy Able to follow commands, remains intubated. Barely 2/5 in LE with exception of left foot where she has no movement. Plan as above.  -- Amie Portland, MD [facility] Pager: [phone] If 7pm to 7am, please call on call as  listed on AMION.

## 2019-12-31 NOTE — Progress Notes (Addendum)
[facility] ROUNDING NOTE   Subjective:   [name] diabetes rheumatoid arthritis chronic CellCept possible seizure disorder presented 12/26/2019 with altered mental status seizures and diabetic ketoacidosis.  She developed acute kidney injury  and anuric renal failure.  Lactic acid level was 10.8.  Her baseline creatinine appears to be in the normal range.  The etiology of her renal failure appears to be secondary to ischemic acute tubular necrosis in setting of septic shock and diabetes ketoacidosis.  Renal ultrasound showed normal right and left kidney with an unremarkable CT scan.  CRRT was initiated 12/29/2019.  She is being kept even on CRRT  Blood pressure 101/53 pulse 101 temperature 97.5 O2 sats 100% FiO2 50%  Sodium 136 potassium 3.2 chloride 99 CO2 21 BUN 24 creatinine 2.62 glucose was 200 calcium 7.7 phosphorus 1.3 magnesium 2.4  Protonix 40 mg daily IV fentanyl IV insulin IV norepinephrine IV Keppra IV Zosyn 3.375 g every 6 hours IV Vanco    Objective:  Vital signs in last 24 hours:  Temp:  [97.5 F (36.4 C)-98.2 F (36.8 C)] 97.5 F (36.4 C) (06/15 0347) Pulse Rate:  [92-144] 101 (06/15 0645) Resp:  [23-44] 39 (06/15 0645) BP: (72-137)/(29-109) 101/53 (06/15 0645) SpO2:  [96 %-100 %] 100 % (06/15 0645) FiO2 (%):  [50 %] 50 % (06/15 0325) Weight:  [180.3 kg] 180.3 kg (06/15 0500)  Weight change: -4.5 kg Filed Weights   12/28/19 0100 12/30/19 0436 12/31/19 0500  Weight: (!) 168.2 kg (!) 184.8 kg (!) 180.3 kg    Intake/Output: I/O last 3 completed shifts: In: 6073.5 [I.V.:2610.3; NG/GT:1936.7; IV Piggyback:1526.6] Out: 6009 [Urine:27; Emesis/NG output:50; PVXYI:0165]   Intake/Output this shift:  No intake/output data recorded.     General exam: Ill-looking female sedated and intubated Respiratory system: Coarse mechanical breath sound bilateral Cardiovascular system: S1 & S2 heard, RRR.  Edema present in all  extremities. Gastrointestinal system: Abdomen is distended, soft. Central nervous system: Sedated and not responding Extremities: Edematous, no cyanosis or clubbing Skin: No rashes, lesions or ulcers Psychiatry: Unable to assess as patient is sedated.   Basic Metabolic Panel: Recent Labs  Lab 01/06/2020 2053 12/23/2019 2059 12/28/19 2153 12/29/19 0046 12/29/19 0103 12/29/19 0103 12/29/19 1532 12/29/19 1532 12/30/19 0352 12/30/19 1545 12/31/19 0509  NA  --    < >   < >  --  148*  --  144  145  --  139 138 136  K  --    < >   < > 2.8* 2.8*  --  3.2*  3.2*  --  3.5 3.8 3.2*  CL  --    < >   < >  --  101  --  102  104  --  102 101 99  CO2  --    < >   < >  --  24  --  20*  20*  --  21* 23 21*  GLUCOSE  --    < >   < >  --  204*  --  234*  235*  --  171* 183* 200*  BUN  --    < >   < >  --  37*  --  41*  41*  --  28* 23* 24*  CREATININE  --    < >   < >  --  5.74*  --  5.82*  5.87*  --  3.81* 3.01* 2.62*  CALCIUM  --    < >   < >  --  7.0*   < > 6.4*  6.5*   < > 6.9* 7.4* 7.7*  MG 4.3*  --   --  2.1  --   --   --   --  1.8 2.1 2.4  PHOS  --   --   --   --   --   --  3.3  --  2.3* 1.7* 1.3*   < > = values in this interval not displayed.    Liver Function Tests: Recent Labs  Lab 01/09/2020 2023 01/03/2020 2023 12/29/19 0046 12/29/19 1532 12/30/19 0352 12/30/19 1545 12/31/19 0509  AST 76*  --  6,313*  --   --   --  473*  ALT 101*  --  1,153*  --   --   --  353*  ALKPHOS 369*  --  158*  --   --   --  256*  BILITOT 1.9*  --  2.4*  --   --   --  11.6*  PROT 8.3*  --  5.4*  --   --   --  5.9*  ALBUMIN 4.0   < > 2.6* 2.2* 2.8* 3.0* 3.2*   < > = values in this interval not displayed.   Recent Labs  Lab 12/28/19 0249  LIPASE 1,652*   Recent Labs  Lab 12/28/19 0934 12/29/19 0636  AMMONIA 77* 91*    CBC: Recent Labs  Lab 12/26/2019 2004 01/15/2020 2059 12/28/19 0249 12/28/19 2217 12/29/19 0046  WBC 10.7*  --  14.3*  --  9.3  NEUTROABS 7.9*  --   --   --  6.2  HGB  18.9* 21.8* 17.0* 17.3* 15.9*  HCT 67.2* 64.0* 56.8* 51.0* 56.4*  MCV 104.0*  --  98.6  --  103.5*  PLT 250  --  201  --  113*    Cardiac Enzymes: No results for input(s): CKTOTAL, CKMB, CKMBINDEX, TROPONINI in the last 168 hours.  BNP: Invalid input(s): POCBNP  CBG: Recent Labs  Lab 12/30/19 2155 12/30/19 2348 12/31/19 0147 12/31/19 0351 12/31/19 0554  GLUCAP 179* 176* 160* 173* 173*    Microbiology: Results for orders placed or performed during the hospital encounter of 12/21/2019  SARS Coronavirus 2 by RT PCR (hospital order, performed in [facility] hospital lab) Nasopharyngeal Nasopharyngeal Swab     Status: None   Collection Time: 01/09/2020  9:38 PM   Specimen: Nasopharyngeal Swab  Result Value Ref Range Status   SARS Coronavirus 2 NEGATIVE NEGATIVE Final    Comment: (NOTE) SARS-CoV-2 target nucleic acids are NOT DETECTED.  The SARS-CoV-2 RNA is generally detectable in upper and lower respiratory specimens during the acute phase of infection. The lowest concentration of SARS-CoV-2 viral copies this assay can detect is 250 copies / mL. A negative result does not preclude SARS-CoV-2 infection and should not be used as the sole basis for treatment or other patient management decisions.  A negative result may occur with improper specimen collection / handling, submission of specimen other than nasopharyngeal swab, presence of viral mutation(s) within the areas targeted by this assay, and inadequate number of viral copies (<250 copies / mL). A negative result must be combined with clinical observations, patient history, and epidemiological information.  Fact Sheet for Patients:   StrictlyIdeas.no  Fact Sheet for Healthcare Providers: BankingDealers.co.za  This test is not yet approved or  cleared by the [place] FDA and has been authorized for detection and/or diagnosis of SARS-CoV-2 by FDA under an Emergency Use  Authorization (  EUA).  This EUA will remain in effect (meaning this test can be used) for the duration of the COVID-19 declaration under Section 564(b)(1) of the Act, 21 U.S.C. section 360bbb-3(b)(1), unless the authorization is terminated or revoked sooner.  Performed at [facility], [place] [address], [place], [place] [address]   MRSA PCR Screening     Status: None   Collection Time: 12/28/19  1:07 AM   Specimen: Nasal Mucosa; Nasopharyngeal  Result Value Ref Range Status   MRSA by PCR NEGATIVE NEGATIVE Final    Comment:        The GeneXpert MRSA Assay (FDA approved for NASAL specimens only), is one component of a comprehensive MRSA colonization surveillance program. It is not intended to diagnose MRSA infection nor to guide or monitor treatment for MRSA infections. Performed at [facility], [place] [address], [place], [place] [address]   Culture, Urine     Status: None   Collection Time: 12/28/19  1:15 AM   Specimen: Urine, Catheterized  Result Value Ref Range Status   Specimen Description URINE, CATHETERIZED  Final   Special Requests NONE  Final   Culture   Final    NO GROWTH Performed at [place] [address], [place], [place] [address]    Report Status 12/29/2019 FINAL  Final  Culture, blood (routine x 2)     Status: None (Preliminary result)   Collection Time: 12/28/19  2:49 AM   Specimen: BLOOD  Result Value Ref Range Status   Specimen Description BLOOD LEFT ANTECUBITAL  Final   Special Requests   Final    BOTTLES DRAWN AEROBIC ONLY Blood Culture adequate volume   Culture   Final    NO GROWTH 2 DAYS Performed at [facility], [place] [address], [place], [place] [address]    Report Status PENDING  Incomplete  Culture, blood (routine x 2)     Status: None (Preliminary result)   Collection Time: 12/28/19  2:49 AM   Specimen: BLOOD RIGHT HAND  Result Value Ref Range Status   Specimen Description BLOOD RIGHT HAND  Final   Special  Requests   Final    BOTTLES DRAWN AEROBIC ONLY Blood Culture adequate volume   Culture   Final    NO GROWTH 2 DAYS Performed at [facility], [place] [address] [place], [place] [address]    Report Status PENDING  Incomplete    Coagulation Studies: No results for input(s): LABPROT, INR in the last 72 hours.  Urinalysis: No results for input(s): COLORURINE, LABSPEC, PHURINE, GLUCOSEU, HGBUR, BILIRUBINUR, KETONESUR, PROTEINUR, UROBILINOGEN, NITRITE, LEUKOCYTESUR in the last 72 hours.  Invalid input(s): APPERANCEUR    Imaging: DG CHEST PORT 1 VIEW  Result Date: 12/29/2019 CLINICAL DATA:  Central line placement EXAM: PORTABLE CHEST 1 VIEW COMPARISON:  12/28/2019 FINDINGS: Interval placement of right neck vascular catheter, tip position difficult to precisely locate on underpenetrated examination although over the right atrium. Interval placement of left neck vascular catheter, tip projecting over the superior vena cava. Support apparatus is otherwise unchanged, including endotracheal tube and esophagogastric tube. Unchanged mild, diffuse interstitial pulmonary opacity. Cardiomegaly. IMPRESSION: 1. Interval placement of right neck vascular catheter, tip position difficult to precisely locate on underpenetrated examination although over the right atrium. 2. Interval placement of left neck vascular catheter, tip projecting over the superior vena cava. 3. Unchanged mild, diffuse interstitial pulmonary opacity, likely edema in the setting of cardiomegaly. Electronically Signed   By: Cristie Hem  Laqueta Carina M.D.   On: 12/29/2019 14:40   ECHOCARDIOGRAM COMPLETE  Result Date: 12/29/2019    ECHOCARDIOGRAM REPORT   Patient Name:   KJERSTIN ABRIGO Date of Exam: 12/29/2019 Medical Rec #:  [id number]       Height:       64.0 in Accession #:    [id number]      Weight:       370.8 lb Date of Birth:  May 18, 1986       BSA:          2.543 m Patient Age:    67 years        BP:           81/64 mmHg Patient Gender: F                HR:           113 bpm. Exam Location:  Inpatient Procedure: 2D Echo, Color Doppler, Cardiac Doppler and Intracardiac            Opacification Agent Indications:    R94.31 Abnormal EKG  History:        Patient has prior history of Echocardiogram examinations, most                 recent 04/09/2018. Risk Factors:Hypertension, Diabetes and Sleep                 Apnea.  Sonographer:    Raquel Sarna Senior RDCS Referring Phys: [id number] Francesca Jewett  Sonographer Comments: Very technically difficult study due to patient body habitus. Scanned supine on artificial respirator. IMPRESSIONS  1. Left ventricular ejection fraction, by estimation, is 55%. The left ventricle has normal function. The left ventricle has no regional wall motion abnormalities. Left ventricular diastolic parameters were normal.  2. Right ventricular systolic function was not well visualized. The right ventricular size is not well visualized.  3. Some calcification on the atrial side of the posterior mitral leaflet Not well visualized consider f/u imaging when patient off vent and able to be positioned non supine . The mitral valve is normal in structure. No evidence of mitral valve regurgitation. No evidence of mitral stenosis.  4. The aortic valve was not well visualized. Aortic valve regurgitation is not visualized. Mild aortic valve sclerosis is present, with no evidence of aortic valve stenosis.  5. The inferior vena cava is normal in size with greater than 50% respiratory variability, suggesting right atrial pressure of 3 mmHg. FINDINGS  Left Ventricle: Left ventricular ejection fraction, by estimation, is 55%. The left ventricle has normal function. The left ventricle has no regional wall motion abnormalities. Definity contrast agent was given IV to delineate the left ventricular endocardial borders. The left ventricular internal cavity size was normal in size. There is no left ventricular hypertrophy. Left ventricular diastolic parameters were  normal. Right Ventricle: The right ventricular size is not well visualized. Right vetricular wall thickness was not assessed. Right ventricular systolic function was not well visualized. Left Atrium: Left atrial size was normal in size. Right Atrium: Right atrial size was normal in size. Pericardium: There is no evidence of pericardial effusion. Mitral Valve: Some calcification on the atrial side of the posterior mitral leaflet Not well visualized consider f/u imaging when patient off vent and able to be positioned non supine. The mitral valve is normal in structure. There is mild thickening of the mitral valve leaflet(s). There is mild calcification of the mitral valve leaflet(s). Normal mobility of the mitral valve  leaflets. Mild mitral annular calcification. No evidence of mitral valve regurgitation. No evidence of mitral valve stenosis. Tricuspid Valve: The tricuspid valve is normal in structure. Tricuspid valve regurgitation is not demonstrated. No evidence of tricuspid stenosis. Aortic Valve: The aortic valve was not well visualized. Aortic valve regurgitation is not visualized. Mild aortic valve sclerosis is present, with no evidence of aortic valve stenosis. Pulmonic Valve: The pulmonic valve was normal in structure. Pulmonic valve regurgitation is not visualized. No evidence of pulmonic stenosis. Aorta: The aortic root is normal in size and structure. Venous: The inferior vena cava was not well visualized. The inferior vena cava is normal in size with greater than 50% respiratory variability, suggesting right atrial pressure of 3 mmHg. IAS/Shunts: No atrial level shunt detected by color flow Doppler.  LEFT VENTRICLE PLAX 2D LVIDd:         3.60 cm  Diastology LVIDs:         2.50 cm  LV e' lateral:   8.49 cm/s LV PW:         1.00 cm  LV E/e' lateral: 4.7 LV IVS:        1.40 cm  LV e' medial:    5.22 cm/s LVOT diam:     1.90 cm  LV E/e' medial:  7.7 LV SV:         37 LV SV Index:   15 LVOT Area:     2.84 cm   RIGHT VENTRICLE RV S prime:     11.40 cm/s TAPSE (M-mode): 1.4 cm LEFT ATRIUM           Index       RIGHT ATRIUM          Index LA diam:      3.00 cm 1.18 cm/m  RA Area:     9.21 cm LA Vol (A4C): 36.0 ml 14.15 ml/m RA Volume:   16.60 ml 6.53 ml/m  AORTIC VALVE LVOT Vmax:   76.23 cm/s LVOT Vmean:  54.733 cm/s LVOT VTI:    0.131 m  AORTA Ao Root diam: 2.80 cm Ao Asc diam:  2.60 cm MITRAL VALVE MV Area (PHT): 2.66 cm    SHUNTS MV Decel Time: 285 msec    Systemic VTI:  0.13 m MV E velocity: 40.10 cm/s  Systemic Diam: 1.90 cm MV A velocity: 44.20 cm/s MV E/A ratio:  0.91 Jenkins Rouge MD Electronically signed by Jenkins Rouge MD Signature Date/Time: 12/29/2019/10:47:24 AM    Final      Medications:   .  prismasol BGK 4/2.5 500 mL/hr at 12/30/19 2100  .  prismasol BGK 4/2.5 300 mL/hr at 12/31/19 0024  . sodium chloride Stopped (12/28/19 1117)  . sodium chloride Stopped (12/29/19 0755)  . albumin human 25 g (12/31/19 2094)  . feeding supplement (VITAL HIGH PROTEIN) 50 mL/hr at 12/30/19 1210  . fentaNYL infusion INTRAVENOUS 100 mcg/hr (12/31/19 0600)  . heparin    . insulin 7 mL/hr at 12/31/19 0600  . norepinephrine (LEVOPHED) Adult infusion 12 mcg/min (12/31/19 0600)  . piperacillin-tazobactam 100 mL/hr at 12/31/19 0600  . prismasol BGK 4/2.5 1,500 mL/hr at 12/31/19 7096  . sodium chloride    . vancomycin Stopped (12/30/19 1159)  . vasopressin (PITRESSIN) infusion - *FOR SHOCK* Stopped (12/30/19 1325)   . B-complex with vitamin C  1 tablet Per Tube Daily  . chlorhexidine gluconate (MEDLINE KIT)  15 mL Mouth Rinse BID  . Chlorhexidine Gluconate Cloth  6 each Topical Q0600  . docusate  100 mg Per Tube BID  . feeding supplement (PRO-STAT SUGAR FREE 64)  30 mL Per Tube Daily  . free water  200 mL Per Tube Q4H  . heparin  5,000 Units Subcutaneous Q8H  . levETIRAcetam  500 mg Per Tube BID  . mouth rinse  15 mL Mouth Rinse 10 times per day  . pantoprazole sodium  40 mg Per Tube Daily  .  polyethylene glycol  17 g Per Tube Daily  . sodium chloride flush  10-40 mL Intracatheter Q12H   sodium chloride, dextrose, fentaNYL, heparin, heparin, midazolam, midazolam, sodium chloride flush  Assessment/ Plan:   Acute kidney injury secondary ischemic ATN in setting of septic shock diabetic ketoacidosis anuric.  CT scan and ultrasound showed no evidence of hydronephrosis CRRT initiated 12/29/2019.  We will continue with no change  Hypotension/shock continues to wean pressors.  Kept even on CRRT.  Ventilator dependent respiratory failure  Lactic acidosis secondary to septic shock  Diabetes mellitus with ketoacidosis.  IV insulin  Encephalopathy generalized continuous slowing and triphasic waves MRI of brain ordered with continuous EEG management continues on Keppra appreciate assistance from neurology  Septic shock IV vancomycin and Zosyn source appears to be unclear  Hypokalemia we will replete.  She is getting maximum potassium with CRRT  Hypophosphatemia will replete    LOS: Keddie _0 _1 :05 AM

## 2019-12-31 NOTE — Progress Notes (Signed)
Attempted twice to get patient down for MRI. RN Stated that patient is to unstable to come down for scan. Will try again at a later time.

## 2019-12-31 NOTE — Progress Notes (Signed)
NAME:  Courtney Zamora, MRN:  [id number], DOB:  1986/07/10, LOS: 4 ADMISSION DATE:  12/21/2019, CONSULTATION DATE:  12/31/19 REFERRING MD:  EDP, CHIEF COMPLAINT:  seizures   Brief History   34 y.o. F with PMH of RA, Type 2 DM, possible seizures, HTN who presented with shaking of abnormal jerking movements thought to be seizures, AMS and hyperglycemia.  Pt loaded with Keppra, head CT negative, also developed fever and had a significant lactic acidosis.  Given AMS, PCCM consulted for admission.  History of present illness   Courtney Zamora is a  RA on cellcept, Type 2 DM, possible seizures (no prior EEG and no medications per husband), HTN who presented with shaking of abnormal jerking movements thought to be seizures, AMS and hyperglycemia.   He reports that about two days ago she was having trouble holding a cup because of weakness and was only eating fruit because everything tasted metallic.  He states her blood sugar was high, but it usually is.  She had some constipation, but did not complain of abdominal pain, diarrhea, nausea, vomiting or fever.  Earlier in the day, he noted her L arm shaking, he thought she was having a seizure, though this was different than her usual seizure.  These he describes as being diagnosed by a PCP and not on any medication.  She was sometimes scratch her face and pull her hair and then be fine immediately afterwards, she does not seek medical care after these episodes.  In the ED, pt was having continued twitching/jerking of the L side and was loaded with Keppra. CT head negative.  ED course significant for worsening mental status, oxygen requirement, lactic acid 10.8, K 2.4, Glucose >1100, anion gap 25, Co2 34.  Pt was able to intermittently answer questions and c/o abdominal pain.   She was given K 47mq and 3L IVF, she then spiked a fever to 101F.  Pt seen by neurology and EEG did not show evidence of seizures.   PCCM consulted for admission  Past Medical History    has a past medical history of Anxiety, Asthma, Chronic headache, Chronic lower back pain, Depression, Essential hypertension, Family history of adverse reaction to anesthesia, GERD (gastroesophageal reflux disease), [place], Migraine, Obesity, OSA on CPAP, Osteoarthritis, knee, Pneumonia, Prolonged QT syndrome, PTSD (post-traumatic stress disorder), Rheumatoid arthritis ([place], [place]izures ([place], Severe needle phobia, and Type II diabetes mellitus [place].   Significant Hospital Events   [facility] still profoundly sick, septic requiring 3 pressors   Consults:  neurology Renal: CRRT  Procedures:  6/12 endotracheal intubation 6/12 central line placement in the right IJ 6/13 left IJ central line placement 6/13 right IJ hemodialysis catheter  Significant Diagnostic Tests:  6/11 CT head>> limited due to patient motion, no acute findings 6/11 CT abd/pelvis>> There is some mild fat stranding about the pancreas, which may represent acute pancreatitis. There is some mild wall thickening of the duodenum, which may be reactive or secondary to duodenitis. 6/12 chest x-ray following IJ and endotracheal tube-line in good placement, ET tube adequately placed OG tube adequately placed  Micro Data:  6/12 BCx2-no growth so far 6/12 UC>>  Antimicrobials:  Zosyn 6/12- Vancomycin 6/12-  Interim history/subjective:  Stably alert this morning and able to follow commands in all extremities except LLE. Pt denies pain. Only sedation is Fentanyl 50 gtt Decreased pressor requirements over last 24 hrs. Epi and vasopressin off and now only requiring  10 of norepi. Respiratory rate  continues to be in the 30s and 40s. Pt does not appear ready to wean. Patient responds to name calling raises right hand and was able to give a thumbs up this morning  CRRT continuing. Tube feeds initiated yesterday.   K3.2, CO2 21, BUN 24, Cr 2.62, Ca (corrected) 8.3 after repletion yesterday, Alk Phos 256, AST  474/ ALT 353, T bili 11.6 Objective   Blood pressure (!) 86/61, pulse (!) 108, temperature 98.5 F (36.9 C), temperature source Axillary, resp. rate (!) 45, height _0  (1.626 m), weight (!) 180.3 kg, SpO2 99 %. CVP:  [9 mmHg-14 mmHg] 13 mmHg  Vent Mode: PRVC FiO2 (%):  [40 %-50 %] 40 % Set Rate:  [16 bmp] 16 bmp Vt Set:  [430 mL] 430 mL PEEP:  [8 cmH20] 8 cmH20 Plateau Pressure:  [16 cmH20-27 cmH20] 19 cmH20   Intake/Output Summary (Last 24 hours) at 12/31/2019 0839 Last data filed at 12/31/2019 0800 Gross per 24 hour  Intake 4118.02 ml  Output 4664 ml  Net -545.98 ml   Filed Weights   12/28/19 0100 12/30/19 0436 12/31/19 0500  Weight: (!) 168.2 kg (!) 184.8 kg (!) 180.3 kg    General:  Obese F, appears anxious, awake on vent HEENT: MM pink/moist, scleral icterus present, endotracheal tube in place Neuro: Follows one-step commands in all extremities except LLE. CV: S1-S2 appreciated PULM: Coarse breath sounds bilaterally,  GI: soft, obese, diffuse TTP, bsx4 active   Resolved Hospital Problem list     Assessment & Plan:  35 y.o. F who presented with possible seizures and AMS,  found to have hyperglycemia, possible sepsis and pancreatitis   DKA/HHS Initial glucose >1100 with gap of 25, pH 7.4. Blood sugar is normalizing now on endotool. Bicarb stable at 21 this morning. Suspect intravascular volume depletion given presentation with suspected DKA. We will continue fluid replacement and minimize volume removal with CRRT. -Sugars now <190 - Continue albumin q6h for volume repletion -Insulin aspart Endo tool -q4hrs BMP -Continue to follow aggressively  Jaundice - T bili 11.6 this morning. Patient appears jaundice. LFTs downtrending from initial presentation, alk phos up to 256 today. Considering biliary obstruction vs hemolysis. Could also be sequelae of severe sepsis and critical illness. Will obtain a CBC today and consider hemolysis labs.  - CBC  - Consider LDH,  haptoglobin, peripheral blood smear - Consider RUQ [place]   Septic Shock -Unclear source currently -follow lactic acid-trending down -blood & urine cultures- NG @ 2 days  -broad spectrum abx-vancomycin and Zosyn-continue the same -off epinephrine and vasopressin. Continue to wean norepinephrine as tolerated  -discontinue stress dose steroids  Multiple failed attempts at placing an arterial line Trial by multiple providers  Acute Hypoxic Respiratory Failure Possibly due to AMS, CXR with vascular congestion and cardiomegaly.  Last Echo in 2019 with preserved EF. Continues to breathe 40+ RPM over ventilator. Unclear why. VBG this AM showed normal pH and pCO2 of 39. Acidosis is not the driving factor here. We are concerned for possible underlying worsening lung pathology or possible volume overload. Patient is already on broad spectrum antibiotics. Concern for possible viral etiology as well. We will get a stat CXR today and consider further workup.  -F/u CXR   -50-100cc fluid removal on CRRT for possible volume overload -Repeat echocardiogram performed 12/29/2019-poor window, LV EF ~55% w/out regional wall motion abnormalities; LV cavity normal in size, no LVH. RV not visualized. IVC w/ >50% respiratory variability.  -Continue mechanical ventilation to maintain O2  sat >93% -Wean as tolerated  Abdominal pain and possible pancreatitis Peri-pancreatic stranding on CT. Repeat LFTs down-trendings, suggest resolving shock liver. -Lipase elevated to 1600 on admission -UA unremarkable other than glucose, neg for ketones and urine/blood cultures are no growth to date  -Continue broad spectrum abx with Zosyn/Vancomycin (if cultures remain negative, will consider stopping 6/15)   Acute encephalopathy and possible seizures CT head was negative, I am unsure whether patient's reported seizure activity at home represents seizure activity, she is not on medication and per husband, will shake, but also scratch  her face and pull her hair and then return to her baseline. EEG without epileptiform discharges, suggestive of severe diffuse encephalopathy-EEG monitoring discontinued, MRI ordered. We will complete once she is more stable. -Seen by neurology, appreciate recs.  -Continue Keppra 524m q12 hours  Acute Kidney Injury, hypokalemia, hypocalcemia, hypophosphatemia Patient remains anuric - Continuing CRRT - Nephrology following, appreciate recs - Continue to renally dose meds  - Replete K, phos and Ca  History of HTN -hold home medications  Rheumatoid Arthritis  -Hold Cellcept for now   Best practice:  Diet: Tube Feeds Pain/Anxiety/Delirium protocol (if indicated): Fentanyl gtt VAP protocol (if indicated): In place DVT prophylaxis: heparin GI prophylaxis: Protonix Glucose control: insulin gtt Mobility: bed rest Code Status: full code Family Communication:  Disposition: ICU  Labs   CBC: Recent Labs  Lab 01/07/2020 2004 01/07/2020 2059 12/28/19 0249 12/28/19 2217 12/29/19 0046  WBC 10.7*  --  14.3*  --  9.3  NEUTROABS 7.9*  --   --   --  6.2  HGB 18.9* 21.8* 17.0* 17.3* 15.9*  HCT 67.2* 64.0* 56.8* 51.0* 56.4*  MCV 104.0*  --  98.6  --  103.5*  PLT 250  --  201  --  113*    Basic Metabolic Panel: Recent Labs  Lab 12/30/2019 2053 12/28/2019 2059 12/28/19 2153 12/29/19 0046 12/29/19 0103 12/29/19 1532 12/30/19 0352 12/30/19 1545 12/31/19 0509  NA  --    < >   < >  --  148* 144  145 139 138 136  K  --    < >   < > 2.8* 2.8* 3.2*  3.2* 3.5 3.8 3.2*  CL  --    < >   < >  --  101 102  104 102 101 99  CO2  --    < >   < >  --  24 20*  20* 21* 23 21*  GLUCOSE  --    < >   < >  --  204* 234*  235* 171* 183* 200*  BUN  --    < >   < >  --  37* 41*  41* 28* 23* 24*  CREATININE  --    < >   < >  --  5.74* 5.82*  5.87* 3.81* 3.01* 2.62*  CALCIUM  --    < >   < >  --  7.0* 6.4*  6.5* 6.9* 7.4* 7.7*  MG 4.3*  --   --  2.1  --   --  1.8 2.1 2.4  PHOS  --   --   --   --    --  3.3 2.3* 1.7* 1.3*   < > = values in this interval not displayed.   GFR: Estimated Creatinine Clearance: 49.6 mL/min (A) (by C-G formula based on SCr of 2.62 mg/dL (H)). Recent Labs  Lab 01/09/2020 2004 12/17/2019 2115 12/24/2019 2313 12/28/19  9562 12/28/19 1308 12/29/19 0046 12/29/19 0103 12/30/19 0837 12/31/19 0738  WBC 10.7*  --   --  14.3*  --  9.3  --   --   --   LATICACIDVEN  --    < >   < >  --  >11.0*  --  6.9* 2.9* 2.0*   < > = values in this interval not displayed.    Liver Function Tests: Recent Labs  Lab 01/05/2020 2023 12/30/2019 2023 12/29/19 0046 12/29/19 1532 12/30/19 0352 12/30/19 1545 12/31/19 0509  AST 76*  --  6,313*  --   --   --  473*  ALT 101*  --  1,153*  --   --   --  353*  ALKPHOS 369*  --  158*  --   --   --  256*  BILITOT 1.9*  --  2.4*  --   --   --  11.6*  PROT 8.3*  --  5.4*  --   --   --  5.9*  ALBUMIN 4.0   < > 2.6* 2.2* 2.8* 3.0* 3.2*   < > = values in this interval not displayed.   Recent Labs  Lab 12/28/19 0249  LIPASE 1,652*   Recent Labs  Lab 12/28/19 0934 12/29/19 0636  AMMONIA 77* 91*    ABG    Component Value Date/Time   HCO3 31.3 (H) 12/28/2019 2217   TCO2 33 (H) 12/28/2019 2217   O2SAT 50.0 12/28/2019 2217     Coagulation Profile: No results for input(s): INR, PROTIME in the last 168 hours.  Cardiac Enzymes: No results for input(s): CKTOTAL, CKMB, CKMBINDEX, TROPONINI in the last 168 hours.  HbA1C: Hemoglobin A1C  Date/Time Value Ref Range Status  10/25/2019 08:58 AM 9.7 (A) 4.0 - 5.6 % Final  04/04/2019 11:07 AM 9.8 (A) 4.0 - 5.6 % Final   Hgb A1c MFr Bld  Date/Time Value Ref Range Status  09/05/2017 06:00 PM 9.6 (H) 4.8 - 5.6 % Final    Comment:    (NOTE) Pre diabetes:          5.7%-6.4% Diabetes:              >6.4% Glycemic control for   <7.0% adults with diabetes     CBG: Recent Labs  Lab 12/30/19 2348 12/31/19 0147 12/31/19 0351 12/31/19 0554 12/31/19 0743  GLUCAP 176* 160* 173* 173*  161*    Review of Systems:   Unable to obtain secondary to mental status  Past Medical History  She,  has a past medical history of Anxiety, Asthma, Chronic headache, Chronic lower back pain, Depression, Essential hypertension, Family history of adverse reaction to anesthesia, GERD (gastroesophageal reflux disease), Manic state ([place]), Migraine, Obesity, OSA on CPAP, Osteoarthritis, knee, Pneumonia, Prolonged QT syndrome, PTSD (post-traumatic stress disorder), Rheumatoid arthritis ([place]), Seizures ([place]), Severe needle phobia, and Type II diabetes mellitus ([place]).   Surgical History    Past Surgical History:  Procedure Laterality Date  . TONSILLECTOMY  1994     Social History   reports that she has never smoked. She has never used smokeless tobacco. She reports previous alcohol use. She reports that she does not use drugs.   Family History   Her family history includes Anxiety disorder in her mother; Depression in her mother; Diabetes in her father and paternal uncle; Heart disease in her paternal grandfather; Hypertension in her maternal grandfather, maternal grandmother, paternal grandfather, and paternal grandmother; Hypothyroidism in her mother; Pancreatic cancer in her  maternal grandfather.    Lorne Skeens, Medical Student

## 2020-01-01 ENCOUNTER — Inpatient Hospital Stay (HOSPITAL_COMMUNITY): Payer: Medicaid Other

## 2020-01-01 LAB — RENAL FUNCTION PANEL
Albumin: 2.4 g/dL — ABNORMAL LOW (ref 3.5–5.0)
Anion gap: 13 (ref 5–15)
BUN: 29 mg/dL — ABNORMAL HIGH (ref 6–20)
CO2: 22 mmol/L (ref 22–32)
Calcium: 7.5 mg/dL — ABNORMAL LOW (ref 8.9–10.3)
Chloride: 102 mmol/L (ref 98–111)
Creatinine, Ser: 2.86 mg/dL — ABNORMAL HIGH (ref 0.44–1.00)
GFR calc Af Amer: 24 mL/min — ABNORMAL LOW (ref >60)
GFR calc non Af Amer: 20 mL/min — ABNORMAL LOW (ref >60)
Glucose, Bld: 173 mg/dL — ABNORMAL HIGH (ref 70–99)
Phosphorus: 3.7 mg/dL (ref 2.5–4.6)
Potassium: 5 mmol/L (ref 3.5–5.1)
Sodium: 137 mmol/L (ref 135–145)

## 2020-01-01 LAB — COMPREHENSIVE METABOLIC PANEL
ALT: 262 U/L — ABNORMAL HIGH (ref 0–44)
AST: 291 U/L — ABNORMAL HIGH (ref 15–41)
Albumin: 3 g/dL — ABNORMAL LOW (ref 3.5–5.0)
Alkaline Phosphatase: 434 U/L — ABNORMAL HIGH (ref 38–126)
Anion gap: 16 — ABNORMAL HIGH (ref 5–15)
BUN: 23 mg/dL — ABNORMAL HIGH (ref 6–20)
CO2: 20 mmol/L — ABNORMAL LOW (ref 22–32)
Calcium: 8.3 mg/dL — ABNORMAL LOW (ref 8.9–10.3)
Chloride: 99 mmol/L (ref 98–111)
Creatinine, Ser: 2.28 mg/dL — ABNORMAL HIGH (ref 0.44–1.00)
GFR calc Af Amer: 31 mL/min — ABNORMAL LOW (ref >60)
GFR calc non Af Amer: 27 mL/min — ABNORMAL LOW (ref >60)
Glucose, Bld: 149 mg/dL — ABNORMAL HIGH (ref 70–99)
Potassium: 4.5 mmol/L (ref 3.5–5.1)
Sodium: 135 mmol/L (ref 135–145)
Total Bilirubin: 15.1 mg/dL — ABNORMAL HIGH (ref 0.3–1.2)
Total Protein: 6.2 g/dL — ABNORMAL LOW (ref 6.5–8.1)

## 2020-01-01 LAB — CBC
HCT: 42.4 % (ref 36.0–46.0)
Hemoglobin: 12.3 g/dL (ref 12.0–15.0)
MCH: 29.3 pg (ref 26.0–34.0)
MCHC: 29 g/dL — ABNORMAL LOW (ref 30.0–36.0)
MCV: 101 fL — ABNORMAL HIGH (ref 80.0–100.0)
Platelets: 76 10*3/uL — ABNORMAL LOW (ref 150–400)
RBC: 4.2 MIL/uL (ref 3.87–5.11)
RDW: 16.5 % — ABNORMAL HIGH (ref 11.5–15.5)
WBC: 8.2 10*3/uL (ref 4.0–10.5)
nRBC: 22.9 % — ABNORMAL HIGH (ref 0.0–0.2)

## 2020-01-01 LAB — GLUCOSE, CAPILLARY
Glucose-Capillary: 150 mg/dL — ABNORMAL HIGH (ref 70–99)
Glucose-Capillary: 151 mg/dL — ABNORMAL HIGH (ref 70–99)
Glucose-Capillary: 152 mg/dL — ABNORMAL HIGH (ref 70–99)
Glucose-Capillary: 155 mg/dL — ABNORMAL HIGH (ref 70–99)
Glucose-Capillary: 160 mg/dL — ABNORMAL HIGH (ref 70–99)
Glucose-Capillary: 163 mg/dL — ABNORMAL HIGH (ref 70–99)
Glucose-Capillary: 165 mg/dL — ABNORMAL HIGH (ref 70–99)
Glucose-Capillary: 166 mg/dL — ABNORMAL HIGH (ref 70–99)
Glucose-Capillary: 166 mg/dL — ABNORMAL HIGH (ref 70–99)
Glucose-Capillary: 167 mg/dL — ABNORMAL HIGH (ref 70–99)
Glucose-Capillary: 168 mg/dL — ABNORMAL HIGH (ref 70–99)
Glucose-Capillary: 168 mg/dL — ABNORMAL HIGH (ref 70–99)
Glucose-Capillary: 172 mg/dL — ABNORMAL HIGH (ref 70–99)
Glucose-Capillary: 181 mg/dL — ABNORMAL HIGH (ref 70–99)
Glucose-Capillary: 183 mg/dL — ABNORMAL HIGH (ref 70–99)
Glucose-Capillary: 183 mg/dL — ABNORMAL HIGH (ref 70–99)
Glucose-Capillary: 188 mg/dL — ABNORMAL HIGH (ref 70–99)
Glucose-Capillary: 192 mg/dL — ABNORMAL HIGH (ref 70–99)
Glucose-Capillary: 195 mg/dL — ABNORMAL HIGH (ref 70–99)

## 2020-01-01 LAB — PHOSPHORUS: Phosphorus: 2.3 mg/dL — ABNORMAL LOW (ref 2.5–4.6)

## 2020-01-01 LAB — MAGNESIUM: Magnesium: 2.9 mg/dL — ABNORMAL HIGH (ref 1.7–2.4)

## 2020-01-01 LAB — LACTIC ACID, PLASMA: Lactic Acid, Venous: 3.8 mmol/L (ref 0.5–1.9)

## 2020-01-01 MED ORDER — SODIUM CHLORIDE 0.9 % IV BOLUS
1000.0000 mL | Freq: Once | INTRAVENOUS | Status: AC
  Administered 2020-01-01: 1000 mL via INTRAVENOUS

## 2020-01-01 MED ORDER — IOHEXOL 350 MG/ML SOLN
60.0000 mL | Freq: Once | INTRAVENOUS | Status: AC | PRN
  Administered 2020-01-01: 60 mL via INTRAVENOUS

## 2020-01-01 MED ORDER — POTASSIUM CHLORIDE 10 MEQ/100ML IV SOLN: 10.0000 meq | INTRAVENOUS | Status: DC

## 2020-01-01 MED ORDER — ROCURONIUM BROMIDE 50 MG/5ML IV SOLN
100.0000 mg | Freq: Once | INTRAVENOUS | Status: AC
  Filled 2020-01-01: qty 10

## 2020-01-01 MED ORDER — SODIUM PHOSPHATES 45 MMOLE/15ML IV SOLN
10.0000 mmol | Freq: Once | INTRAVENOUS | Status: AC
  Administered 2020-01-01: 10 mmol via INTRAVENOUS
  Filled 2020-01-01: qty 3.33

## 2020-01-01 MED ORDER — MIDAZOLAM HCL 2 MG/2ML IJ SOLN
6.0000 mg | Freq: Once | INTRAMUSCULAR | Status: AC
  Administered 2020-01-01: 6 mg via INTRAVENOUS
  Filled 2020-01-01: qty 6

## 2020-01-01 MED ORDER — VASOPRESSIN 20 UNIT/ML IV SOLN
0.0400 [IU]/min | INTRAVENOUS | Status: DC
  Administered 2020-01-01 – 2020-01-05 (×6): 0.04 [IU]/min via INTRAVENOUS
  Filled 2020-01-01 (×7): qty 2

## 2020-01-01 MED ORDER — MIDAZOLAM HCL 2 MG/2ML IJ SOLN: 5.0000 mg | Freq: Once | INTRAMUSCULAR | Status: DC

## 2020-01-01 MED ORDER — ROCURONIUM BROMIDE 10 MG/ML (PF) SYRINGE
PREFILLED_SYRINGE | INTRAVENOUS | Status: AC
  Administered 2020-01-01: 100 mg
  Filled 2020-01-01: qty 10

## 2020-01-01 NOTE — Progress Notes (Signed)
[facility] ROUNDING NOTE   Subjective:   [name] diabetes rheumatoid arthritis chronic CellCept possible seizure disorder presented 01/04/2020 with altered mental status seizures and diabetic ketoacidosis.  She developed acute kidney injury  and anuric renal failure.  Lactic acid level was 10.8.  Her baseline creatinine appears to be in the normal range.  The etiology of her renal failure appears to be secondary to ischemic acute tubular necrosis in setting of septic shock and diabetes ketoacidosis.  Renal ultrasound showed normal right and left kidney with an unremarkable CT scan.  CRRT was initiated 12/29/2019.  She is being kept even on CRRT  Blood pressure 96/59 pulse 107 temperature 99 O2 sats 100% FiO2 40%  Sodium 135 potassium 3.5 chloride 98 CO2 21 BUN 23 creatinine 2.39 glucose 228 calcium 7.9.  Hemoglobin 12.3  Keppra 1 g twice daily, Protonix 40 mg daily  Protonix 40 mg daily IV fentanyl IV insulin IV norepinephrine IV Keppra IV Zosyn 3.375 g every 6 hours IV Vanco    Objective:  Vital signs in last 24 hours:  Temp:  [97.9 F (36.6 C)-100.3 F (37.9 C)] 99 F (37.2 C) (06/16 0403) Pulse Rate:  [97-111] 107 (06/16 0630) Resp:  [24-49] 45 (06/16 0630) BP: (75-117)/(38-77) 96/59 (06/16 0630) SpO2:  [98 %-100 %] 100 % (06/16 0630) FiO2 (%):  [40 %] 40 % (06/16 0309) Weight:  [181.2 kg] 181.2 kg (06/16 0500)  Weight change: 0.9 kg Filed Weights   12/30/19 0436 12/31/19 0500 01/01/20 0500  Weight: (!) 184.8 kg (!) 180.3 kg (!) 181.2 kg    Intake/Output: I/O last 3 completed shifts: In: 6472.6 [I.V.:1342.7; NG/GT:2936.7; IV Piggyback:2193.3] Out: 9678 [Urine:27; Emesis/NG output:50; LFYBO:1751]   Intake/Output this shift:  Total I/O In: 1197.5 [I.V.:511.6; NG/GT:590; IV Piggyback:95.9] Out: 1654 [WCHEN:2778]     General exam: Ill-looking female sedated and intubated Respiratory system: Coarse mechanical breath sound  bilateral Cardiovascular system: S1 & S2 heard, RRR.  Edema present in all extremities. Gastrointestinal system: Abdomen is distended, soft. Central nervous system: Sedated and not responding Extremities: Edematous, no cyanosis or clubbing Skin: No rashes, lesions or ulcers Psychiatry: Unable to assess as patient is sedated.   Basic Metabolic Panel: Recent Labs  Lab 12/29/19 0046 12/29/19 0103 12/29/19 1532 12/29/19 1532 12/30/19 0352 12/30/19 0352 12/30/19 1545 12/31/19 0509 12/31/19 1600  NA  --    < > 144   145  --  139  --  138 136 135  K 2.8*   < > 3.2*   3.2*  --  3.5  --  3.8 3.2* 3.5  CL  --    < > 102   104  --  102  --  101 99 98  CO2  --    < > 20*   20*  --  21*  --  23 21* 21*  GLUCOSE  --    < > 234*   235*  --  171*  --  183* 200* 228*  BUN  --    < > 41*   41*  --  28*  --  23* 24* 23*  CREATININE  --    < > 5.82*   5.87*  --  3.81*  --  3.01* 2.62* 2.39*  CALCIUM  --    < > 6.4*   6.5*   < > 6.9*   < > 7.4* 7.7* 7.9*  MG 2.1  --   --   --  1.8  --  2.1 2.4 2.4  PHOS  --   --  3.3  --  2.3*  --  1.7* 1.3* 1.7*   < > = values in this interval not displayed.    Liver Function Tests: Recent Labs  Lab 12/21/2019 2023 01/03/2020 2023 12/29/19 0046 12/29/19 0046 12/29/19 1532 12/30/19 0352 12/30/19 1545 12/31/19 0509 12/31/19 1600  AST 76*  --  6,313*  --   --   --   --  473*  --   ALT 101*  --  1,153*  --   --   --   --  353*  --   ALKPHOS 369*  --  158*  --   --   --   --  256*  --   BILITOT 1.9*  --  2.4*  --   --   --   --  11.6*  --   PROT 8.3*  --  5.4*  --   --   --   --  5.9*  --   ALBUMIN 4.0   < > 2.6*   < > 2.2* 2.8* 3.0* 3.2* 3.4*   < > = values in this interval not displayed.   Recent Labs  Lab 12/28/19 0249  LIPASE 1,652*   Recent Labs  Lab 12/28/19 0934 12/29/19 0636  AMMONIA 77* 91*    CBC: Recent Labs  Lab 12/20/2019 2004 01/06/2020 2004 12/22/2019 2059 12/28/19 0249 12/28/19 2217 12/29/19 0046 01/01/20 0502  WBC 10.7*  --    --  14.3*  --  9.3 8.2  NEUTROABS 7.9*  --   --   --   --  6.2  --   HGB 18.9*   < > 21.8* 17.0* 17.3* 15.9* 12.3  HCT 67.2*   < > 64.0* 56.8* 51.0* 56.4* 42.4  MCV 104.0*  --   --  98.6  --  103.5* 101.0*  PLT 250  --   --  201  --  113* 76*   < > = values in this interval not displayed.    Cardiac Enzymes: No results for input(s): CKTOTAL, CKMB, CKMBINDEX, TROPONINI in the last 168 hours.  BNP: Invalid input(s): POCBNP  CBG: Recent Labs  Lab 01/01/20 0219 01/01/20 0317 01/01/20 0413 01/01/20 0525 01/01/20 0624  GLUCAP 188* 168* 183* 155* 168*    Microbiology: Results for orders placed or performed during the hospital encounter of 01/06/2020  SARS Coronavirus 2 by RT PCR (hospital order, performed in [facility] hospital lab) Nasopharyngeal Nasopharyngeal Swab     Status: None   Collection Time: 12/19/2019  9:38 PM   Specimen: Nasopharyngeal Swab  Result Value Ref Range Status   SARS Coronavirus 2 NEGATIVE NEGATIVE Final    Comment: (NOTE) SARS-CoV-2 target nucleic acids are NOT DETECTED.  The SARS-CoV-2 RNA is generally detectable in upper and lower respiratory specimens during the acute phase of infection. The lowest concentration of SARS-CoV-2 viral copies this assay can detect is 250 copies / mL. A negative result does not preclude SARS-CoV-2 infection and should not be used as the sole basis for treatment or other patient management decisions.  A negative result may occur with improper specimen collection / handling, submission of specimen other than nasopharyngeal swab, presence of viral mutation(s) within the areas targeted by this assay, and inadequate number of viral copies (<250 copies / mL). A negative result must be combined with clinical observations, patient history, and epidemiological information.  Fact Sheet for Patients:  StrictlyIdeas.no  Fact Sheet for Healthcare Providers: BankingDealers.co.za  This  test is not yet approved or  cleared by the [place] FDA and has been authorized for detection and/or diagnosis of SARS-CoV-2 by FDA under an Emergency Use Authorization (EUA).  This EUA will remain in effect (meaning this test can be used) for the duration of the COVID-19 declaration under Section 564(b)(1) of the Act, 21 U.S.C. section 360bbb-3(b)(1), unless the authorization is terminated or revoked sooner.  Performed at [facility], [place] [address] [place], [place] [address]   MRSA PCR Screening     Status: None   Collection Time: 12/28/19  1:07 AM   Specimen: Nasal Mucosa; Nasopharyngeal  Result Value Ref Range Status   MRSA by PCR NEGATIVE NEGATIVE Final    Comment:        The GeneXpert MRSA Assay (FDA approved for NASAL specimens only), is one component of a comprehensive MRSA colonization surveillance program. It is not intended to diagnose MRSA infection nor to guide or monitor treatment for MRSA infections. Performed at [facility], [place] [address] [place], [place] [address]   Culture, Urine     Status: None   Collection Time: 12/28/19  1:15 AM   Specimen: Urine, Catheterized  Result Value Ref Range Status   Specimen Description URINE, CATHETERIZED  Final   Special Requests NONE  Final   Culture   Final    NO GROWTH Performed at [place] [address], [place], [place] [address]    Report Status 12/29/2019 FINAL  Final  Culture, blood (routine x 2)     Status: None (Preliminary result)   Collection Time: 12/28/19  2:49 AM   Specimen: BLOOD  Result Value Ref Range Status   Specimen Description BLOOD LEFT ANTECUBITAL  Final   Special Requests   Final    BOTTLES DRAWN AEROBIC ONLY Blood Culture adequate volume   Culture   Final    NO GROWTH 3 DAYS Performed at [facility], [place] [address], [place], [place] [address]    Report Status PENDING  Incomplete  Culture, blood (routine x 2)     Status: None (Preliminary result)    Collection Time: 12/28/19  2:49 AM   Specimen: BLOOD RIGHT HAND  Result Value Ref Range Status   Specimen Description BLOOD RIGHT HAND  Final   Special Requests   Final    BOTTLES DRAWN AEROBIC ONLY Blood Culture adequate volume   Culture   Final    NO GROWTH 3 DAYS Performed at [facility], [address], [place], [place] [address]    Report Status PENDING  Incomplete    Coagulation Studies: No results for input(s): LABPROT, INR in the last 72 hours.  Urinalysis: No results for input(s): COLORURINE, LABSPEC, PHURINE, GLUCOSEU, HGBUR, BILIRUBINUR, KETONESUR, PROTEINUR, UROBILINOGEN, NITRITE, LEUKOCYTESUR in the last 72 hours.  Invalid input(s): APPERANCEUR    Imaging: DG CHEST PORT 1 VIEW  Result Date: 12/31/2019 CLINICAL DATA:  Hypoxia EXAM: PORTABLE CHEST 1 VIEW COMPARISON:  December 29, 2019 FINDINGS: Endotracheal tube tip is 1.7 cm above the carina. Nasogastric tube tip and side port are below the diaphragm. Right jugular catheter tip is in the superior vena cava. Left central catheter tip is in the right atrium slightly beyond the cavoatrial junction. No pneumothorax. There is bibasilar atelectasis. Lungs elsewhere are clear. Heart size and pulmonary vascularity are normal. No adenopathy. No bone lesions. IMPRESSION: Tube and catheter positions  as described without pneumothorax. Bibasilar atelectasis. Lungs otherwise clear. Cardiac silhouette within normal limits. Electronically Signed   By: Lowella Grip III M.D.   On: 12/31/2019 12:06     Medications:     prismasol BGK 4/2.5 500 mL/hr at 01/01/20 0320    prismasol BGK 4/2.5 300 mL/hr at 12/31/19 0024   sodium chloride Stopped (12/28/19 1117)   sodium chloride Stopped (12/29/19 0755)   feeding supplement (VITAL HIGH PROTEIN) 1,000 mL (12/31/19 1045)   fentaNYL infusion INTRAVENOUS 100 mcg/hr (01/01/20 0600)   heparin 999 mL/hr at 12/31/19 1656   insulin 7.5 mL/hr at 01/01/20 0600   norepinephrine  (LEVOPHED) Adult infusion 35 mcg/min (01/01/20 0600)   piperacillin-tazobactam 100 mL/hr at 01/01/20 0600   prismasol BGK 4/2.5 1,500 mL/hr at 01/01/20 0631   sodium chloride     vancomycin Stopped (12/31/19 1201)    B-complex with vitamin C  1 tablet Per Tube Daily   chlorhexidine gluconate (MEDLINE KIT)  15 mL Mouth Rinse BID   Chlorhexidine Gluconate Cloth  6 each Topical Q0600   docusate  100 mg Per Tube BID   feeding supplement (PRO-STAT SUGAR FREE 64)  30 mL Per Tube Daily   heparin  5,000 Units Subcutaneous Q8H   levETIRAcetam  1,000 mg Per Tube BID   mouth rinse  15 mL Mouth Rinse 10 times per day   pantoprazole sodium  40 mg Per Tube Daily   polyethylene glycol  17 g Per Tube Daily   sodium chloride flush  10-40 mL Intracatheter Q12H   sodium chloride, dextrose, fentaNYL, heparin, heparin, midazolam, sodium chloride flush  Assessment/ Plan:   Acute kidney injury secondary ischemic ATN in setting of septic shock diabetic ketoacidosis anuric.  CT scan and ultrasound showed no evidence of hydronephrosis CRRT initiated 12/29/2019.  We will continue with no change  Hypotension/shock continues to wean pressors.  Kept even on CRRT.  Ventilator dependent respiratory failure  Lactic acidosis secondary to septic shock  Diabetes mellitus with ketoacidosis.  IV insulin  Encephalopathy generalized continuous slowing and triphasic waves MRI of brain ordered with continuous EEG management continues on Keppra appreciate assistance from neurology  Septic shock IV vancomycin and Zosyn source appears to be unclear  Hypokalemia improved.  She is getting maximum potassium with CRRT.  Will give another IV round of potassium today  Hypophosphatemia will replete    LOS: Fifty[place]Six '@TODAY''@6'$ :48 AM

## 2020-01-01 NOTE — Progress Notes (Signed)
Spoke with Dr. Hyman Hopes from nephrology about patient receiving IV contrast for CT of chest to r/o PE. Dr. Hyman Hopes stated it is ok to administer.

## 2020-01-01 NOTE — Progress Notes (Signed)
Ventilator pt transported from 2M14 to CT and back without any complications.

## 2020-01-01 NOTE — Progress Notes (Signed)
Unchanged exam. Remains with labile BP, unable to go to MRI Get MRI when able. Will follow  -- Milon Dikes, MD [facility] Pager: [phone] If 7pm to 7am, please call on call as listed on AMION.

## 2020-01-01 NOTE — Progress Notes (Signed)
Patient transported to CT and back. On transport patient vomited small amount of emesis. Placed patient's OGT to LIS and patient put out gastric residual. Patient also hypotensive with SBP 70s/80s, MAPs in 40s/50s. Notified Dr. Denese Killings. Orders to give IVF bolus obtained, keep patient's OGT to LIS, and restart CRRT when patient's BP is higher.

## 2020-01-01 NOTE — Progress Notes (Signed)
NAME:  Courtney Zamora, MRN:  [id number], DOB:  12/16/1985, LOS: 5 ADMISSION DATE:  12/24/2019, CONSULTATION DATE:  01/01/20 REFERRING MD:  EDP, CHIEF COMPLAINT:  seizures   Brief History   34 y.o. F with PMH of RA, Type 2 DM, possible seizures, HTN who presented with shaking of abnormal jerking movements thought to be seizures, AMS and hyperglycemia.  Pt loaded with Keppra, head CT negative, also developed fever and had a significant lactic acidosis.  Given AMS, PCCM consulted for admission.  History of present illness   Courtney Zamora is a  RA on cellcept, Type 2 DM, possible seizures (no prior EEG and no medications per husband), HTN who presented with shaking of abnormal jerking movements thought to be seizures, AMS and hyperglycemia.   He reports that about two days ago she was having trouble holding a cup because of weakness and was only eating fruit because everything tasted metallic.  He states her blood sugar was high, but it usually is.  She had some constipation, but did not complain of abdominal pain, diarrhea, nausea, vomiting or fever.  Earlier in the day, he noted her L arm shaking, he thought she was having a seizure, though this was different than her usual seizure.  These he describes as being diagnosed by a PCP and not on any medication.  She was sometimes scratch her face and pull her hair and then be fine immediately afterwards, she does not seek medical care after these episodes.  In the ED, pt was having continued twitching/jerking of the L side and was loaded with Keppra. CT head negative.  ED course significant for worsening mental status, oxygen requirement, lactic acid 10.8, K 2.4, Glucose >1100, anion gap 25, Co2 34.  Pt was able to intermittently answer questions and c/o abdominal pain.   She was given K and 3L IVF, she then spiked a fever to 101F.  Pt seen by neurology and EEG did not show evidence of seizures.   PCCM consulted for admission  Past Medical History    has a past medical history of Anxiety, Asthma, Chronic headache, Chronic lower back pain, Depression, Essential hypertension, Family history of adverse reaction to anesthesia, GERD (gastroesophageal reflux disease), Manic state (HCC), Migraine, Obesity, OSA on CPAP, Osteoarthritis, knee, Pneumonia, Prolonged QT syndrome, PTSD (post-traumatic stress disorder), Rheumatoid arthritis (HCC), Seizures (HCC), Severe needle phobia, and Type II diabetes mellitus (HCC).   Significant Hospital Events   6/11 Admit to [facility] 6/13 still profoundly sick, septic requiring 3 pressors   Consults:  neurology Renal: CRRT  Procedures:  6/12 endotracheal intubation 6/12 central line placement in the right IJ 6/13 left IJ central line placement 6/13 right IJ hemodialysis catheter  Significant Diagnostic Tests:  6/11 CT head>> limited due to patient motion, no acute findings 6/11 CT abd/pelvis>> There is some mild fat stranding about the pancreas, which may represent acute pancreatitis. There is some mild wall thickening of the duodenum, which may be reactive or secondary to duodenitis. 6/12 chest x-ray following IJ and endotracheal tube-line in good placement, ET tube adequately placed OG tube adequately placed  Micro Data:  6/12 BCx2-no growth so far 6/12 UC>>  Antimicrobials:  Zosyn 6/12- Vancomycin 6/12-  Interim history/subjective:   Stably alert this morning and able to follow commands in all extremities except LLE. Pt denies pain. Only sedation is Fentanyl 100 gtt MAPs have been labile over the last 12 hours with the reduction in pressors and the initiation of volume removal (  50cc) w/ CRRT. MAPs dropped to the 40s at times overnight, requiring increased norepi. The patient is now on 35mcg/min NE and MAPs remain in the low 60s.   Respiratory rate is now in the 40s-50s with marked increase in RR when the patient is lying flat. We attempted to lay her flat this morning to move her up in the bed and RR  went up to 58. The patient appeared to be in respiratory distress. Minute ventilation is now at 15-18, down from ~20 yesterday. No apparent pathology on CXR yesterday, so we will plan to move forward w/ the CT today. Pt does not appear ready to wean.  CRRT continuing, now w/out pulling volume. Tube feeds continuing.   The patient has not had a bowel movement during this admission despite daily miralax and senna.    T bili 15.7, Platelets 76 Objective   Blood pressure (!) 88/59, pulse (!) 113, temperature 97.9 F (36.6 C), temperature source Oral, resp. rate (!) 53, height 5\' 4"  (1.626 m), weight (!) 181.2 kg, SpO2 100 %. CVP:  [11 mmHg-16 mmHg] 11 mmHg  Vent Mode: PRVC FiO2 (%):  [40 %] 40 % Set Rate:  [16 bmp] 16 bmp Vt Set:  [430 mL-440 mL] 440 mL PEEP:  [8 cmH20] 8 cmH20 Plateau Pressure:  [20 cmH20-34 cmH20] 34 cmH20   Intake/Output Summary (Last 24 hours) at 01/01/2020 0817 Last data filed at 01/01/2020 0800 Gross per 24 hour  Intake 3355.24 ml  Output 4153 ml  Net -797.76 ml   Filed Weights   12/30/19 0436 12/31/19 0500 01/01/20 0500  Weight: (!) 184.8 kg (!) 180.3 kg (!) 181.2 kg    General:  Obese F, awake on vent HEENT: MM pink/moist, scleral icterus present, endotracheal tube in place Neuro: Follows one-step commands in all extremities except LLE. CV: S1-S2 appreciated PULM: Coarse breath sounds bilaterally,  GI: soft, obese, diffuse TTP, bsx4 active   Resolved Hospital Problem list     Assessment & Plan:  34 y.o. F who presented with possible seizures and AMS,  found to have hyperglycemia, possible sepsis and pancreatitis   DKA/HHS Initial glucose >1100 with gap of 25, pH 7.4. Blood sugar is normalizing now on endotool. Bicarb stable at 20 this morning. Suspect intravascular volume depletion given presentation with suspected DKA. We will continue fluid replacement and minimize volume removal with CRRT. -Insulin aspart Endo tool -q4hrs BMP -Continue to follow  aggressively  Jaundice - T bili 15.1 this morning. Patient appears jaundice. LFTs downtrending from initial presentation. Most likely sequelae of severe sepsis and critical illness. Also considering biliary obstruction vs hemolysis. Hb relatively stable since admission, so less likely to be hemolysis.  - Continue to monitor   Thrombocytopenia - Platelets at 76 today down from 250 on admission. Patient has been on heparin since 6/11. We are considering HIT, but it would be an early diagnosis, since she has only been on heparin for 4 days. Could also be sequelae of her severe sepsis or could be drug-induced due to zosyn.  - Continue to monitor -  Consider switching to warfarin or a DOAC.    Septic Shock -Unclear source currently -follow lactic acid-trending down -blood & urine cultures- NG @ 2 days  -broad spectrum abx- Con't Zosyn -off epinephrine and vasopressin. Continue to wean norepinephrine as tolerated   Multiple failed attempts at placing an arterial line Trial by multiple providers  Acute Hypoxic Respiratory Failure Possibly due to AMS, CXR with vascular congestion and cardiomegaly.  Last Echo in 2019 with preserved EF. RR today is up to 45-55 with decreased minute ventilation at 15-18 (down from ~20 yesterday). CXR yesterday showed some bibasilar atelectasis. We were concerned for some amount of fluid overload, so we increased to  50cc fluid removal on CRRT yesterday, but now the patient has increased levophed requirements. Patient's RR increases dramatically when she lying flat. Suspect ongoing hypoventilation of lungs, possible physical restriction due to body habitus.  The patient has not had a bowel movement this admission, and it is possible her abdomen is full and this is also limiting her tidal volumes.  We are also concerned for a possible DVT/PE or underlying lung pathology not appreciated on CXR. - CT chest this AM  - Keep head of bed elevated to at least 45  degrees -50-100cc fluid removal on CRRT for possible volume overload -Repeat echocardiogram performed 12/29/2019-poor window, LV EF ~55% w/out regional wall motion abnormalities; LV cavity normal in size, no LVH. RV not visualized. IVC w/ >50% respiratory variability.  -Continue mechanical ventilation to maintain O2 sat >93% -Wean as tolerated  Abdominal pain and possible pancreatitis Peri-pancreatic stranding on CT. Repeat LFTs down-trendings, suggest resolving shock liver. -Lipase elevated to 1600 on admission -UA on admission unremarkable other than glucose, neg for ketones and urine/blood cultures are no growth to date  -Continue broad spectrum abx with Zosyn   Acute encephalopathy and possible seizures CT head was negative, I am unsure whether patient's reported seizure activity at home represents seizure activity, she is not on medication and per husband, will shake, but also scratch her face and pull her hair and then return to her baseline. EEG without epileptiform discharges, suggestive of severe diffuse encephalopathy-EEG monitoring discontinued, MRI ordered. We will complete once she is more stable. -Seen by neurology, appreciate recs.  -Continue Keppra 500mg  q12 hours  Acute Kidney Injury, hypokalemia, hypocalcemia, hypophosphatemia Patient remains anuric - Continuing CRRT, not pulling additional fluid at this time given low MAPs - Foley was removed yesterday given anuria, complete daily bladder scans  - Nephrology following, appreciate recs - Continue to renally dose meds  - Replete K, phos   History of HTN -hold home medications  Rheumatoid Arthritis  -Hold Cellcept for now   Best practice:  Diet: Tube Feeds Pain/Anxiety/Delirium protocol (if indicated): Fentanyl gtt VAP protocol (if indicated): In place DVT prophylaxis: heparin GI prophylaxis: Protonix Glucose control: insulin gtt Mobility: bed rest Code Status: full code Family Communication:  Disposition:  ICU  Labs   CBC: Recent Labs  Lab 01/13/2020 2004 12/28/2019 2004 12/21/2019 2059 12/28/19 0249 12/28/19 2217 12/29/19 0046 01/01/20 0502  WBC 10.7*  --   --  14.3*  --  9.3 8.2  NEUTROABS 7.9*  --   --   --   --  6.2  --   HGB 18.9*   < > 21.8* 17.0* 17.3* 15.9* 12.3  HCT 67.2*   < > 64.0* 56.8* 51.0* 56.4* 42.4  MCV 104.0*  --   --  98.6  --  103.5* 101.0*  PLT 250  --   --  201  --  113* 76*   < > = values in this interval not displayed.    Basic Metabolic Panel: Recent Labs  Lab 12/30/19 0352 12/30/19 1545 12/31/19 0509 12/31/19 1600 01/01/20 0502  NA 139 138 136 135 135  K 3.5 3.8 3.2* 3.5 4.5  CL 102 101 99 98 99  CO2 21* 23 21* 21* 20*  GLUCOSE 171*  183* 200* 228* 149*  BUN 28* 23* 24* 23* 23*  CREATININE 3.81* 3.01* 2.62* 2.39* 2.28*  CALCIUM 6.9* 7.4* 7.7* 7.9* 8.3*  MG 1.8 2.1 2.4 2.4 2.9*  PHOS 2.3* 1.7* 1.3* 1.7* 2.3*   GFR: Estimated Creatinine Clearance: 57.2 mL/min (A) (by C-G formula based on SCr of 2.28 mg/dL (H)). Recent Labs  Lab 01/12/2020 2004 01/07/2020 2115 12/31/2019 2313 12/28/19 0249 12/28/19 6644 12/29/19 0046 12/29/19 0103 12/30/19 0837 12/31/19 0738 01/01/20 0502  WBC 10.7*  --   --  14.3*  --  9.3  --   --   --  8.2  LATICACIDVEN  --    < >   < >  --  >11.0*  --  6.9* 2.9* 2.0*  --    < > = values in this interval not displayed.    Liver Function Tests: Recent Labs  Lab 01/01/2020 2023 01/14/2020 2023 12/29/19 0046 12/29/19 1532 12/30/19 0352 12/30/19 1545 12/31/19 0509 12/31/19 1600 01/01/20 0502  AST 76*  --  6,313*  --   --   --  473*  --  291*  ALT 101*  --  1,153*  --   --   --  353*  --  262*  ALKPHOS 369*  --  158*  --   --   --  256*  --  434*  BILITOT 1.9*  --  2.4*  --   --   --  11.6*  --  15.1*  PROT 8.3*  --  5.4*  --   --   --  5.9*  --  6.2*  ALBUMIN 4.0   < > 2.6*   < > 2.8* 3.0* 3.2* 3.4* 3.0*   < > = values in this interval not displayed.   Recent Labs  Lab 12/28/19 0249  LIPASE 1,652*   Recent Labs   Lab 12/28/19 0934 12/29/19 0636  AMMONIA 77* 91*    ABG    Component Value Date/Time   HCO3 22.0 12/31/2019 0826   TCO2 33 (H) 12/28/2019 2217   ACIDBASEDEF 2.5 (H) 12/31/2019 0826   O2SAT 64.1 12/31/2019 0826     Coagulation Profile: No results for input(s): INR, PROTIME in the last 168 hours.  Cardiac Enzymes: No results for input(s): CKTOTAL, CKMB, CKMBINDEX, TROPONINI in the last 168 hours.  HbA1C: Hemoglobin A1C  Date/Time Value Ref Range Status  10/25/2019 08:58 AM 9.7 (A) 4.0 - 5.6 % Final  04/04/2019 11:07 AM 9.8 (A) 4.0 - 5.6 % Final   Hgb A1c MFr Bld  Date/Time Value Ref Range Status  12/26/2019 11:00 PM >15.5 (H) 4.8 - 5.6 % Final    Comment:    (NOTE) **Verified by repeat analysis**         Prediabetes: 5.7 - 6.4         Diabetes: >6.4         Glycemic control for adults with diabetes: <7.0   09/05/2017 06:00 PM 9.6 (H) 4.8 - 5.6 % Final    Comment:    (NOTE) Pre diabetes:          5.7%-6.4% Diabetes:              >6.4% Glycemic control for   <7.0% adults with diabetes     CBG: Recent Labs  Lab 01/01/20 0317 01/01/20 0413 01/01/20 0525 01/01/20 0624 01/01/20 0743  GLUCAP 168* 183* 155* 168* 166*    Review of Systems:   Unable to obtain secondary to mental status  Past Medical History  She,  has a past medical history of Anxiety, Asthma, Chronic headache, Chronic lower back pain, Depression, Essential hypertension, Family history of adverse reaction to anesthesia, GERD (gastroesophageal reflux disease), Manic state (HCC), Migraine, Obesity, OSA on CPAP, Osteoarthritis, knee, Pneumonia, Prolonged QT syndrome, PTSD (post-traumatic stress disorder), Rheumatoid arthritis (HCC), Seizures (HCC), Severe needle phobia, and Type II diabetes mellitus (HCC).   Surgical History    Past Surgical History:  Procedure Laterality Date  . TONSILLECTOMY  1994     Social History   reports that she has never smoked. She has never used smokeless tobacco.  She reports previous alcohol use. She reports that she does not use drugs.   Family History   Her family history includes Anxiety disorder in her mother; Depression in her mother; Diabetes in her father and paternal uncle; Heart disease in her paternal grandfather; Hypertension in her maternal grandfather, maternal grandmother, paternal grandfather, and paternal grandmother; Hypothyroidism in her mother; Pancreatic cancer in her maternal grandfather.    Gerrit Halls, Medical Student

## 2020-01-01 NOTE — Progress Notes (Signed)
NAME:  Courtney Zamora, MRN:  [id number], DOB:  [birth year], LOS: 5 ADMISSION DATE:  12/17/2019, CONSULTATION DATE:  01/01/20 REFERRING MD:  EDP, CHIEF COMPLAINT:  seizures   Brief History   34 y.o. F with PMH of RA, Type 2 DM, possible seizures, HTN who presented with shaking of abnormal jerking movements thought to be seizures, AMS and hyperglycemia.  Pt loaded with Keppra, head CT negative, also developed fever and had a significant lactic acidosis.  Given AMS, PCCM consulted for admission.  History of present illness   Courtney Zamora is a  RA on cellcept, Type 2 DM, possible seizures (no prior EEG and no medications per husband), HTN who presented with shaking of abnormal jerking movements thought to be seizures, AMS and hyperglycemia.   He reports that about two days ago she was having trouble holding a cup because of weakness and was only eating fruit because everything tasted metallic.  He states her blood sugar was high, but it usually is.  She had some constipation, but did not complain of abdominal pain, diarrhea, nausea, vomiting or fever.  Earlier in the day, he noted her L arm shaking, he thought she was having a seizure, though this was different than her usual seizure.  These he describes as being diagnosed by a PCP and not on any medication.  She was sometimes scratch her face and pull her hair and then be fine immediately afterwards, she does not seek medical care after these episodes.  In the ED, pt was having continued twitching/jerking of the L side and was loaded with Keppra. CT head negative.  ED course significant for worsening mental status, oxygen requirement, lactic acid 10.8, K 2.4, Glucose >1100, anion gap 25, Co2 34.  Pt was able to intermittently answer questions and c/o abdominal pain.   She was given K 62mq and 3L IVF, she then spiked a fever to 101F.  Pt seen by neurology and EEG did not show evidence of seizures.   PCCM consulted for admission  Past Medical History    has a past medical history of Anxiety, Asthma, Chronic headache, Chronic lower back pain, Depression, Essential hypertension, Family history of adverse reaction to anesthesia, GERD (gastroesophageal reflux disease), Manic state ([place], Migraine, Obesity, OSA on CPAP, Osteoarthritis, knee, Pneumonia, Prolonged QT syndrome, PTSD (post-traumatic stress disorder), Rheumatoid arthritis ([place], Seizures ([place], Severe needle phobia, and Type II diabetes mellitus ([place].   Significant Hospital Events   [facility], septic requiring 3 pressors   Consults:  neurology Renal: CRRT  Procedures:  6/12 endotracheal intubation 6/12 central line placement in the right IJ 6/13 left IJ central line placement 6/13 right IJ hemodialysis catheter  Significant Diagnostic Tests:  6/11 CT head>> limited due to patient motion, no acute findings 6/11 CT abd/pelvis>> There is some mild fat stranding about the pancreas, which may represent acute pancreatitis. There is some mild wall thickening of the duodenum, which may be reactive or secondary to duodenitis. 6/12 chest x-ray following IJ and endotracheal tube-line in good placement, ET tube adequately placed OG tube adequately placed  Micro Data:  6/12 BCx2-no growth so far 6/12 UC>>  Antimicrobials:  Zosyn 6/12- Vancomycin 6/12-  Interim history/subjective:  Stably alert this morning and able to follow commands in all extremities except LLE. Pt denies pain. Only sedation is Fentanyl 50 gtt Decreased pressor requirements over last 24 hrs. Epi and vasopressin off and now only requiring  10 of norepi. Respiratory rate  continues to be in the 30s and 40s. Pt does not appear ready to wean. Patient responds to name calling raises right hand and was able to give a thumbs up this morning  CRRT continuing. Tube feeds initiated yesterday.   K3.2, CO2 21, BUN 24, Cr 2.62, Ca (corrected) 8.3 after repletion yesterday, Alk Phos 256, AST  474/ ALT 353, T bili 11.6 Objective   Blood pressure (!) 80/69, pulse (!) 106, temperature 97.9 F (36.6 C), temperature source Oral, resp. rate (!) 49, height '5\' 4"'$  (1.626 m), weight (!) 181.2 kg, SpO2 100 %. CVP:  [11 mmHg-16 mmHg] 11 mmHg  Vent Mode: PRVC FiO2 (%):  [40 %] 40 % Set Rate:  [16 bmp] 16 bmp Vt Set:  [430 mL-440 mL] 440 mL PEEP:  [8 cmH20] 8 cmH20 Plateau Pressure:  [20 cmH20-34 cmH20] 34 cmH20   Intake/Output Summary (Last 24 hours) at 01/01/2020 0811 Last data filed at 01/01/2020 0700 Gross per 24 hour  Intake 3251.18 ml  Output 4153 ml  Net -901.82 ml   Filed Weights   12/30/19 0436 12/31/19 0500 01/01/20 0500  Weight: (!) 184.8 kg (!) 180.3 kg (!) 181.2 kg    General:  Obese F, appears anxious, awake on vent HEENT: MM pink/moist, scleral icterus present, endotracheal tube in place Neuro: Follows one-step commands in all extremities except LLE. CV: S1-S2 appreciated PULM: Coarse breath sounds bilaterally,  GI: soft, obese, diffuse TTP, bsx4 active   Resolved Hospital Problem list     Assessment & Plan:  34 y.o. F who presented with possible seizures and AMS,  found to have hyperglycemia, possible sepsis and pancreatitis   DKA/HHS Initial glucose >1100 with gap of 25, pH 7.4. Blood sugar is normalizing now on endotool. Bicarb stable at 21 this morning. Suspect intravascular volume depletion given presentation with suspected DKA. We will continue fluid replacement and minimize volume removal with CRRT. -Sugars now <190 - Continue albumin q6h for volume repletion -Insulin aspart Endo tool -q4hrs BMP -Continue to follow aggressively  Jaundice - T bili 11.6 this morning. Patient appears jaundice. LFTs downtrending from initial presentation, alk phos up to 256 today. Considering biliary obstruction vs hemolysis. Could also be sequelae of severe sepsis and critical illness. Will obtain a CBC today and consider hemolysis labs.  - CBC  - Consider LDH,  haptoglobin, peripheral blood smear - Consider RUQ [place]   Septic Shock -Unclear source currently -follow lactic acid-trending down -blood & urine cultures- NG @ 2 days  -broad spectrum abx-vancomycin and Zosyn-continue the same -off epinephrine and vasopressin. Continue to wean norepinephrine as tolerated  -discontinue stress dose steroids  Multiple failed attempts at placing an arterial line Trial by multiple providers  Acute Hypoxic Respiratory Failure Possibly due to AMS, CXR with vascular congestion and cardiomegaly.  Last Echo in 2019 with preserved EF. Continues to breathe 40+ RPM over ventilator. Unclear why. VBG this AM showed normal pH and pCO2 of 39. Acidosis is not the driving factor here. We are concerned for possible underlying worsening lung pathology or possible volume overload. Patient is already on broad spectrum antibiotics. Concern for possible viral etiology as well. We will get a stat CXR today and consider further workup.  -F/u CXR   -50-100cc fluid removal on CRRT for possible volume overload -Repeat echocardiogram performed 12/29/2019-poor window, LV EF ~55% w/out regional wall motion abnormalities; LV cavity normal in size, no LVH. RV not visualized. IVC w/ >50% respiratory variability.  -Continue mechanical ventilation to maintain O2  sat >93% -Wean as tolerated  Abdominal pain and possible pancreatitis Peri-pancreatic stranding on CT. Repeat LFTs down-trendings, suggest resolving shock liver. -Lipase elevated to 1600 on admission -UA unremarkable other than glucose, neg for ketones and urine/blood cultures are no growth to date  -Continue broad spectrum abx with Zosyn/Vancomycin (if cultures remain negative, will consider stopping 6/15)   Acute encephalopathy and possible seizures CT head was negative, I am unsure whether patient's reported seizure activity at home represents seizure activity, she is not on medication and per husband, will shake, but also scratch  her face and pull her hair and then return to her baseline. EEG without epileptiform discharges, suggestive of severe diffuse encephalopathy-EEG monitoring discontinued, MRI ordered. We will complete once she is more stable. -Seen by neurology, appreciate recs.  -Continue Keppra '500mg'$  q12 hours  Acute Kidney Injury, hypokalemia, hypocalcemia, hypophosphatemia Patient remains anuric - Continuing CRRT - Nephrology following, appreciate recs - Continue to renally dose meds  - Replete K, phos and Ca  History of HTN -hold home medications  Rheumatoid Arthritis  -Hold Cellcept for now   Best practice:  Diet: Tube Feeds Pain/Anxiety/Delirium protocol (if indicated): Fentanyl gtt VAP protocol (if indicated): In place DVT prophylaxis: heparin GI prophylaxis: Protonix Glucose control: insulin gtt Mobility: bed rest Code Status: full code Family Communication:  Disposition: ICU  Labs   CBC: Recent Labs  Lab 12/26/2019 2004 12/30/2019 2004 01/04/2020 2059 12/28/19 0249 12/28/19 2217 12/29/19 0046 01/01/20 0502  WBC 10.7*  --   --  14.3*  --  9.3 8.2  NEUTROABS 7.9*  --   --   --   --  6.2  --   HGB 18.9*   < > 21.8* 17.0* 17.3* 15.9* 12.3  HCT 67.2*   < > 64.0* 56.8* 51.0* 56.4* 42.4  MCV 104.0*  --   --  98.6  --  103.5* 101.0*  PLT 250  --   --  201  --  113* 76*   < > = values in this interval not displayed.    Basic Metabolic Panel: Recent Labs  Lab 12/30/19 0352 12/30/19 1545 12/31/19 0509 12/31/19 1600 01/01/20 0502  NA 139 138 136 135 135  K 3.5 3.8 3.2* 3.5 4.5  CL 102 101 99 98 99  CO2 21* 23 21* 21* 20*  GLUCOSE 171* 183* 200* 228* 149*  BUN 28* 23* 24* 23* 23*  CREATININE 3.81* 3.01* 2.62* 2.39* 2.28*  CALCIUM 6.9* 7.4* 7.7* 7.9* 8.3*  MG 1.8 2.1 2.4 2.4 2.9*  PHOS 2.3* 1.7* 1.3* 1.7* 2.3*   GFR: Estimated Creatinine Clearance: 57.2 mL/min (A) (by C-G formula based on SCr of 2.28 mg/dL (H)). Recent Labs  Lab 01/04/2020 2004 12/30/2019 2115 01/06/2020 2313  12/28/19 0249 12/28/19 1610 12/29/19 0046 12/29/19 0103 12/30/19 0837 12/31/19 0738 01/01/20 0502  WBC 10.7*  --   --  14.3*  --  9.3  --   --   --  8.2  LATICACIDVEN  --    < >   < >  --  >11.0*  --  6.9* 2.9* 2.0*  --    < > = values in this interval not displayed.    Liver Function Tests: Recent Labs  Lab 01/11/2020 2023 01/05/2020 2023 12/29/19 0046 12/29/19 1532 12/30/19 0352 12/30/19 1545 12/31/19 0509 12/31/19 1600 01/01/20 0502  AST 76*  --  6,313*  --   --   --  473*  --  291*  ALT 101*  --  1,153*  --   --   --  353*  --  262*  ALKPHOS 369*  --  158*  --   --   --  256*  --  434*  BILITOT 1.9*  --  2.4*  --   --   --  11.6*  --  15.1*  PROT 8.3*  --  5.4*  --   --   --  5.9*  --  6.2*  ALBUMIN 4.0   < > 2.6*   < > 2.8* 3.0* 3.2* 3.4* 3.0*   < > = values in this interval not displayed.   Recent Labs  Lab 12/28/19 0249  LIPASE 1,652*   Recent Labs  Lab 12/28/19 0934 12/29/19 0636  AMMONIA 77* 91*    ABG    Component Value Date/Time   HCO3 22.0 12/31/2019 0826   TCO2 33 (H) 12/28/2019 2217   ACIDBASEDEF 2.5 (H) 12/31/2019 0826   O2SAT 64.1 12/31/2019 0826     Coagulation Profile: No results for input(s): INR, PROTIME in the last 168 hours.  Cardiac Enzymes: No results for input(s): CKTOTAL, CKMB, CKMBINDEX, TROPONINI in the last 168 hours.  HbA1C: Hemoglobin A1C  Date/Time Value Ref Range Status  10/25/2019 08:58 AM 9.7 (A) 4.0 - 5.6 % Final  04/04/2019 11:07 AM 9.8 (A) 4.0 - 5.6 % Final   Hgb A1c MFr Bld  Date/Time Value Ref Range Status  12/22/2019 11:00 PM >15.5 (H) 4.8 - 5.6 % Final    Comment:    (NOTE) **Verified by repeat analysis**         Prediabetes: 5.7 - 6.4         Diabetes: >6.4         Glycemic control for adults with diabetes: <7.0   09/05/2017 06:00 PM 9.6 (H) 4.8 - 5.6 % Final    Comment:    (NOTE) Pre diabetes:          5.7%-6.4% Diabetes:              >6.4% Glycemic control for   <7.0% adults with diabetes      CBG: Recent Labs  Lab 01/01/20 0317 01/01/20 0413 01/01/20 0525 01/01/20 0624 01/01/20 0743  GLUCAP 168* 183* 155* 168* 166*    Review of Systems:   Unable to obtain secondary to mental status  Past Medical History  She,  has a past medical history of Anxiety, Asthma, Chronic headache, Chronic lower back pain, Depression, Essential hypertension, Family history of adverse reaction to anesthesia, GERD (gastroesophageal reflux disease), Manic state ([place]), Migraine, Obesity, OSA on CPAP, Osteoarthritis, knee, Pneumonia, Prolonged QT syndrome, PTSD (post-traumatic stress disorder), Rheumatoid arthritis ([place]), Seizures ([place]), Severe needle phobia, and Type II diabetes mellitus ([place]).   Surgical History    Past Surgical History:  Procedure Laterality Date  . TONSILLECTOMY  1994     Social History   reports that she has never smoked. She has never used smokeless tobacco. She reports previous alcohol use. She reports that she does not use drugs.   Family History   Her family history includes Anxiety disorder in her mother; Depression in her mother; Diabetes in her father and paternal uncle; Heart disease in her paternal grandfather; Hypertension in her maternal grandfather, maternal grandmother, paternal grandfather, and paternal grandmother; Hypothyroidism in her mother; Pancreatic cancer in her maternal grandfather.    Lorne Skeens, Medical Student

## 2020-01-02 LAB — PHOSPHORUS: Phosphorus: 3.7 mg/dL (ref 2.5–4.6)

## 2020-01-02 LAB — POCT I-STAT EG7
Acid-base deficit: 4 mmol/L — ABNORMAL HIGH (ref 0.0–2.0)
Bicarbonate: 21.5 mmol/L (ref 20.0–28.0)
Calcium, Ion: 1.01 mmol/L — ABNORMAL LOW (ref 1.15–1.40)
HCT: 40 % (ref 36.0–46.0)
Hemoglobin: 13.6 g/dL (ref 12.0–15.0)
O2 Saturation: 50 %
Patient temperature: 98.1
Potassium: 4.2 mmol/L (ref 3.5–5.1)
Sodium: 138 mmol/L (ref 135–145)
TCO2: 23 mmol/L (ref 22–32)
pCO2, Ven: 38.6 mmHg — ABNORMAL LOW (ref 44.0–60.0)
pH, Ven: 7.351 (ref 7.250–7.430)
pO2, Ven: 28 mmHg — CL (ref 32.0–45.0)

## 2020-01-02 LAB — CBC
HCT: 43.2 % (ref 36.0–46.0)
Hemoglobin: 12.7 g/dL (ref 12.0–15.0)
MCH: 29.3 pg (ref 26.0–34.0)
MCHC: 29.4 g/dL — ABNORMAL LOW (ref 30.0–36.0)
MCV: 99.8 fL (ref 80.0–100.0)
Platelets: 94 10*3/uL — ABNORMAL LOW (ref 150–400)
RBC: 4.33 MIL/uL (ref 3.87–5.11)
RDW: 17.3 % — ABNORMAL HIGH (ref 11.5–15.5)
WBC: 18.5 10*3/uL — ABNORMAL HIGH (ref 4.0–10.5)
nRBC: 11.9 % — ABNORMAL HIGH (ref 0.0–0.2)

## 2020-01-02 LAB — COMPREHENSIVE METABOLIC PANEL
ALT: 186 U/L — ABNORMAL HIGH (ref 0–44)
AST: 239 U/L — ABNORMAL HIGH (ref 15–41)
Albumin: 2.4 g/dL — ABNORMAL LOW (ref 3.5–5.0)
Alkaline Phosphatase: 596 U/L — ABNORMAL HIGH (ref 38–126)
Anion gap: 17 — ABNORMAL HIGH (ref 5–15)
BUN: 27 mg/dL — ABNORMAL HIGH (ref 6–20)
CO2: 20 mmol/L — ABNORMAL LOW (ref 22–32)
Calcium: 8 mg/dL — ABNORMAL LOW (ref 8.9–10.3)
Chloride: 100 mmol/L (ref 98–111)
Creatinine, Ser: 2.56 mg/dL — ABNORMAL HIGH (ref 0.44–1.00)
GFR calc Af Amer: 27 mL/min — ABNORMAL LOW (ref >60)
GFR calc non Af Amer: 23 mL/min — ABNORMAL LOW (ref >60)
Glucose, Bld: 167 mg/dL — ABNORMAL HIGH (ref 70–99)
Potassium: 4.5 mmol/L (ref 3.5–5.1)
Sodium: 137 mmol/L (ref 135–145)
Total Bilirubin: 16 mg/dL — ABNORMAL HIGH (ref 0.3–1.2)
Total Protein: 5.7 g/dL — ABNORMAL LOW (ref 6.5–8.1)

## 2020-01-02 LAB — RENAL FUNCTION PANEL
Albumin: 2.1 g/dL — ABNORMAL LOW (ref 3.5–5.0)
Anion gap: 17 — ABNORMAL HIGH (ref 5–15)
BUN: 24 mg/dL — ABNORMAL HIGH (ref 6–20)
CO2: 20 mmol/L — ABNORMAL LOW (ref 22–32)
Calcium: 8.1 mg/dL — ABNORMAL LOW (ref 8.9–10.3)
Chloride: 100 mmol/L (ref 98–111)
Creatinine, Ser: 2.28 mg/dL — ABNORMAL HIGH (ref 0.44–1.00)
GFR calc Af Amer: 31 mL/min — ABNORMAL LOW (ref >60)
GFR calc non Af Amer: 27 mL/min — ABNORMAL LOW (ref >60)
Glucose, Bld: 159 mg/dL — ABNORMAL HIGH (ref 70–99)
Phosphorus: 3.8 mg/dL (ref 2.5–4.6)
Potassium: 4.4 mmol/L (ref 3.5–5.1)
Sodium: 137 mmol/L (ref 135–145)

## 2020-01-02 LAB — CULTURE, BLOOD (ROUTINE X 2)
Culture: NO GROWTH
Culture: NO GROWTH
Special Requests: ADEQUATE
Special Requests: ADEQUATE

## 2020-01-02 LAB — GLUCOSE, CAPILLARY
Glucose-Capillary: 140 mg/dL — ABNORMAL HIGH (ref 70–99)
Glucose-Capillary: 165 mg/dL — ABNORMAL HIGH (ref 70–99)
Glucose-Capillary: 168 mg/dL — ABNORMAL HIGH (ref 70–99)
Glucose-Capillary: 157 mg/dL — ABNORMAL HIGH (ref 70–99)
Glucose-Capillary: 140 mg/dL — ABNORMAL HIGH (ref 70–99)
Glucose-Capillary: 156 mg/dL — ABNORMAL HIGH (ref 70–99)
Glucose-Capillary: 181 mg/dL — ABNORMAL HIGH (ref 70–99)
Glucose-Capillary: 162 mg/dL — ABNORMAL HIGH (ref 70–99)
Glucose-Capillary: 184 mg/dL — ABNORMAL HIGH (ref 70–99)
Glucose-Capillary: 154 mg/dL — ABNORMAL HIGH (ref 70–99)
Glucose-Capillary: 151 mg/dL — ABNORMAL HIGH (ref 70–99)
Glucose-Capillary: 145 mg/dL — ABNORMAL HIGH (ref 70–99)
Glucose-Capillary: 160 mg/dL — ABNORMAL HIGH (ref 70–99)
Glucose-Capillary: 150 mg/dL — ABNORMAL HIGH (ref 70–99)
Glucose-Capillary: 150 mg/dL — ABNORMAL HIGH (ref 70–99)
Glucose-Capillary: 172 mg/dL — ABNORMAL HIGH (ref 70–99)

## 2020-01-02 LAB — MAGNESIUM: Magnesium: 2.9 mg/dL — ABNORMAL HIGH (ref 1.7–2.4)

## 2020-01-02 NOTE — Progress Notes (Signed)
Pharmacy Antibiotic Note  Courtney Zamora is a 34 y.o. female admitted on 01/22/20 with seizure, DKA, and concern for sepsis with unknown source. Patient continuing on CRRT. Will d/c vancomycin today per discussion with CCM. Cultures negative to date.  Height: 5\' 4"  (162.6 cm) Weight: (!) 177.1 kg (390 lb 7 oz) IBW/kg (Calculated) : 54.7  Temp (24hrs), Avg:99.9 F (37.7 C), Min:98.1 F (36.7 C), Max:102 F (38.9 C)  Recent Labs  Lab January 22, 2020 2004 2020-01-22 2115 Jan 22, 2020 2313 12/28/19 0249 12/28/19 0621 12/28/19 0934 12/28/19 2153 12/29/19 0046 12/29/19 0103 12/29/19 0414 12/29/19 1532 12/30/19 0352 12/30/19 0837 12/30/19 1545 12/31/19 0509 12/31/19 0738 12/31/19 1600 01/01/20 0502 01/01/20 0809 01/01/20 1537 01/02/20 0412  WBC 10.7*  --   --  14.3*  --   --   --  9.3  --   --   --   --   --   --   --   --   --  8.2  --   --  18.5*  CREATININE 2.13*   < >   < > 2.84* 1.60*   < >   < >  --  5.74*  --    < > 3.81*  --    < > 2.62*  --  2.39* 2.28*  --  2.86* 2.56*  LATICACIDVEN  --    < >   < >  --  >11.0*  --   --   --  6.9*  --   --   --  2.9*  --   --  2.0*  --   --  3.8*  --   --   VANCORANDOM  --   --   --   --   --   --   --   --   --  33  --  18  --   --   --   --   --   --   --   --   --    < > = values in this interval not displayed.    Estimated Creatinine Clearance: 50.2 mL/min (A) (by C-G formula based on SCr of 2.56 mg/dL (H)).    Plan: Continue zosyn to 3.375g IV q6h (over 30 min) Monitor clinical progress, c/s, abx plan/LOT F/u CRRT tolerance/off time   01/04/20, PharmD, BCPS Please check AMION for all [facility] contact numbers Clinical Pharmacist 01/02/2020 9:15 AM

## 2020-01-02 NOTE — Plan of Care (Signed)
CT head-no acute intraparenchymal abnormality.  Multiple sinus disease-likely secondary to intubation.  Discussed with Dr. Denese Killings in the unit.  Patient unable to get an MRI due to body habitus.  I do not suspect that her left foot drop is due to a stroke but rather might be secondary to a diabetic mononeuropathy/mononeuritis.  In either case, at this point, her respiratory status and other comorbidities are more pressing issues that need to be addressed.  She should definitely be referred for an outpatient neurology evaluation for her left leg weakness after discharge.  Neurology and patient will be available as needed.  Please call with questions   -- Milon Dikes, MD [facility] Pager: [phone] If 7pm to 7am, please call on call as listed on AMION.

## 2020-01-02 NOTE — Progress Notes (Signed)
NAME:  Courtney Zamora, MRN:  [id number], DOB:  May 23, 1986, LOS: 6 ADMISSION DATE:  01/05/2020, CONSULTATION DATE:  01/02/20 REFERRING MD:  EDP, CHIEF COMPLAINT:  seizures   Brief History   34 y.o. F with PMH of RA, Type 2 DM, possible seizures, HTN who presented with shaking of abnormal jerking movements thought to be seizures, AMS and hyperglycemia.  Pt loaded with Keppra, head CT negative, also developed fever and had a significant lactic acidosis.  Given AMS, PCCM consulted for admission.  History of present illness   Courtney Zamora is a  RA on cellcept, Type 2 DM, possible seizures (no prior EEG and no medications per husband), HTN who presented with shaking of abnormal jerking movements thought to be seizures, AMS and hyperglycemia.   He reports that about two days ago she was having trouble holding a cup because of weakness and was only eating fruit because everything tasted metallic.  He states her blood sugar was high, but it usually is.  She had some constipation, but did not complain of abdominal pain, diarrhea, nausea, vomiting or fever.  Earlier in the day, he noted her L arm shaking, he thought she was having a seizure, though this was different than her usual seizure.  These he describes as being diagnosed by a PCP and not on any medication.  She was sometimes scratch her face and pull her hair and then be fine immediately afterwards, she does not seek medical care after these episodes.  In the ED, pt was having continued twitching/jerking of the L side and was loaded with Keppra. CT head negative.  ED course significant for worsening mental status, oxygen requirement, lactic acid 10.8, K 2.4, Glucose >1100, anion gap 25, Co2 34.  Pt was able to intermittently answer questions and c/o abdominal pain.   She was given K and 3L IVF, she then spiked a fever to 101F.  Pt seen by neurology and EEG did not show evidence of seizures.   PCCM consulted for admission  Past Medical History    has a past medical history of Anxiety, Asthma, Chronic headache, Chronic lower back pain, Depression, Essential hypertension, Family history of adverse reaction to anesthesia, GERD (gastroesophageal reflux disease), Manic state (HCC), Migraine, Obesity, OSA on CPAP, Osteoarthritis, knee, Pneumonia, Prolonged QT syndrome, PTSD (post-traumatic stress disorder), Rheumatoid arthritis (HCC), Seizures (HCC), Severe needle phobia, and Type II diabetes mellitus (HCC).   Significant Hospital Events   6/11 Admit to [facility] 6/13 still profoundly sick, septic requiring 3 pressors   Consults:  neurology Renal: CRRT  Procedures:  6/12 endotracheal intubation 6/12 central line placement in the right IJ 6/13 left IJ central line placement 6/13 right IJ hemodialysis catheter  Significant Diagnostic Tests:  6/11 CT head>> limited due to patient motion, no acute findings 6/11 CT abd/pelvis>> There is some mild fat stranding about the pancreas, which may represent acute pancreatitis. There is some mild wall thickening of the duodenum, which may be reactive or secondary to duodenitis. 6/12 chest x-ray following IJ and endotracheal tube-line in good placement, ET tube adequately placed OG tube adequately placed  Micro Data:  6/12 BCx2 >> no growth x4 days  6/12 UC>> NG 6/16 BCx2 >> Antimicrobials:  Zosyn 6/12- Vancomycin 6/12- 6/17  Interim history/subjective:   Sedated this morning, not following commands or withdrawing from pain. Pt fevered to 102 yesterday and blood cx were subsequently drawn. Patient remains on NE and vasopressin. CRRT is pulling minimal fluid. Pt remains anuric.  Chest CT yesterday negative for PE, possible bibasilar PNA. Head CT neg for bleed.  Pt continues to have no movement/ weakness in LLE. Exceeds weight capacity of MRI so we will defer this study.   RR decreasing now in the 30s with MV <10  Patient continues to have emesis to suction. Has not had a bowel movement this  admission.   Objective   Blood pressure 100/64, pulse 94, temperature 98.1 F (36.7 C), temperature source Axillary, resp. rate (!) 33, height 5\' 4"  (1.626 m), weight (!) 177.1 kg, SpO2 99 %. CVP:  [0 mmHg-13 mmHg] 9 mmHg  Vent Mode: PSV;CPAP FiO2 (%):  [40 %] 40 % Set Rate:  [16 bmp] 16 bmp Vt Set:  [440 mL] 440 mL PEEP:  [8 cmH20] 8 cmH20 Pressure Support:  [10 cmH20] 10 cmH20 Plateau Pressure:  [26 cmH20-30 cmH20] 26 cmH20   Intake/Output Summary (Last 24 hours) at 01/02/2020 0836 Last data filed at 01/02/2020 0800 Gross per 24 hour  Intake 4353.8 ml  Output 5127 ml  Net -773.2 ml   Filed Weights   12/31/19 0500 01/01/20 0500 01/02/20 0430  Weight: (!) 180.3 kg (!) 181.2 kg (!) 177.1 kg    General:  Obese F, comatose on vent HEENT: MM pink/moist, scleral icterus present, endotracheal tube in place Neuro: Does not follow commands in any extremities, not withdrawing from pain CV: S1-S2 appreciated PULM: Coarse breath sounds bilaterally  GI: soft, obese, bsx4 active   Resolved Hospital Problem list     Assessment & Plan:  34 y.o. F who presented with possible seizures and AMS,  found to have hyperglycemia, possible sepsis and pancreatitis   DKA/HHS Initial glucose >1100 with gap of 25, pH 7.4. Blood sugar is normalizing now on endotool. Bicarb stable at 20 this morning. Suspect intravascular volume depletion given presentation with suspected DKA. We will continue fluid replacement and minimize volume removal with CRRT. -Insulin aspart Endo tool -q4hrs BMP -Continue to follow aggressively with q2 CBG checks  Jaundice - T bili 16 this morning. Patient appears jaundice. LFTs downtrending from initial presentation. Most likely sequelae of severe sepsis and critical illness.   - Continue to monitor  Thrombocytopenia - Platelets increasing to 94 today. Continuing to hold heparin Could also be sequelae of her severe sepsis or could be drug-induced due to zosyn.  - Continue to  monitor - Maintain SCDs   Septic Shock -Unclear source currently -follow lactic acid-trending down -initial blood & urine cultures- NG @ 4 days  -broad spectrum abx- Con't Zosyn; stop vanco today - New Blood cx drawn 6/16 given fever to 102 and WBC to 18.5.  -Patient is back on norepinephrine and vasopressin today. Still no clear source of infection   Multiple failed attempts at placing an arterial line Trial by multiple providers  Acute Hypoxic Respiratory Failure Possibly due to AMS, CXR with vascular congestion and cardiomegaly.  Last Echo in 2019 with preserved EF. RR downtrending to the 30s today. Chest CT yesterday negative for PE, showed possible bibasilar PNA vs atelectasis. Will continue to monitor respiratory status and wean from vent as tolerated. Patient appeared over-sedated this morning, unclear if stacking effect from narcotics used for sedation over the last few days or possible hypercarbia given decreased RR and acute change. Will check a VBG - VBG  - Keep head of bed elevated to at least 45 degrees - minimize fluid removal on CRRT for possible volume overload -Repeat echocardiogram performed 12/29/2019-poor window, LV EF ~55% w/out  regional wall motion abnormalities; LV cavity normal in size, no LVH. RV not visualized. IVC w/ >50% respiratory variability.  -Continue mechanical ventilation to maintain O2 sat >93% -Wean as tolerated  Abdominal pain and possible pancreatitis Peri-pancreatic stranding on CT. Repeat LFTs down-trendings, suggest resolving shock liver. -Lipase elevated to 1600 on admission -UA on admission unremarkable other than glucose, neg for ketones and urine/blood cultures are no growth to date  -Continue broad spectrum abx with Zosyn  Acute encephalopathy and possible seizures CT head was negative, I am unsure whether patient's reported seizure activity at home represents seizure activity, she is not on medication and per husband, will shake, but also  scratch her face and pull her hair and then return to her baseline. EEG without epileptiform discharges, suggestive of severe diffuse encephalopathy-EEG monitoring discontinued. HCT was unremarkable yesterday. Pt exceeds weight capacity for MRI so we will defer this study at this time.  -Seen by neurology, appreciate recs.  -Continue Keppra 1000mg  q12 hours  Acute Kidney Injury, hypokalemia, hypocalcemia, hypophosphatemia Patient remains anuric - Continuing CRRT - Foley was removed yesterday given anuria, complete daily bladder scans  - Nephrology following, appreciate recs - Continue to renally dose meds   History of HTN -hold home medications  Rheumatoid Arthritis  -Hold Cellcept for now   Best practice:  Diet: Tube Feeds Pain/Anxiety/Delirium protocol (if indicated): Fentanyl gtt VAP protocol (if indicated): In place DVT prophylaxis: SCDs GI prophylaxis: Protonix Glucose control: insulin gtt Mobility: bed rest Code Status: full code Family Communication:  Disposition: ICU  Labs   CBC: Recent Labs  Lab 01/15/2020 2004 01/12/2020 2059 12/28/19 0249 12/28/19 2217 12/29/19 0046 01/01/20 0502 01/02/20 0412  WBC 10.7*  --  14.3*  --  9.3 8.2 18.5*  NEUTROABS 7.9*  --   --   --  6.2  --   --   HGB 18.9*   < > 17.0* 17.3* 15.9* 12.3 12.7  HCT 67.2*   < > 56.8* 51.0* 56.4* 42.4 43.2  MCV 104.0*  --  98.6  --  103.5* 101.0* 99.8  PLT 250  --  201  --  113* 76* 94*   < > = values in this interval not displayed.    Basic Metabolic Panel: Recent Labs  Lab 12/30/19 1545 12/30/19 1545 12/31/19 0509 12/31/19 1600 01/01/20 0502 01/01/20 1537 01/02/20 0412  NA 138   < > 136 135 135 137 137  K 3.8   < > 3.2* 3.5 4.5 5.0 4.5  CL 101   < > 99 98 99 102 100  CO2 23   < > 21* 21* 20* 22 20*  GLUCOSE 183*   < > 200* 228* 149* 173* 167*  BUN 23*   < > 24* 23* 23* 29* 27*  CREATININE 3.01*   < > 2.62* 2.39* 2.28* 2.86* 2.56*  CALCIUM 7.4*   < > 7.7* 7.9* 8.3* 7.5* 8.0*  MG  2.1  --  2.4 2.4 2.9*  --  2.9*  PHOS 1.7*   < > 1.3* 1.7* 2.3* 3.7 3.7   < > = values in this interval not displayed.   GFR: Estimated Creatinine Clearance: 50.2 mL/min (A) (by C-G formula based on SCr of 2.56 mg/dL (H)). Recent Labs  Lab 12/28/19 0249 12/28/19 02/27/20 12/29/19 0046 12/29/19 0103 12/30/19 0837 12/31/19 0738 01/01/20 0502 01/01/20 0809 01/02/20 0412  WBC 14.3*  --  9.3  --   --   --  8.2  --  18.5*  LATICACIDVEN  --    < >  --  6.9* 2.9* 2.0*  --  3.8*  --    < > = values in this interval not displayed.    Liver Function Tests: Recent Labs  Lab 12/24/2019 2023 12/26/2019 2023 12/29/19 0046 12/29/19 1532 12/31/19 0509 12/31/19 1600 01/01/20 0502 01/01/20 1537 01/02/20 0412  AST 76*  --  6,313*  --  473*  --  291*  --  239*  ALT 101*  --  1,153*  --  353*  --  262*  --  186*  ALKPHOS 369*  --  158*  --  256*  --  434*  --  596*  BILITOT 1.9*  --  2.4*  --  11.6*  --  15.1*  --  16.0*  PROT 8.3*  --  5.4*  --  5.9*  --  6.2*  --  5.7*  ALBUMIN 4.0   < > 2.6*   < > 3.2* 3.4* 3.0* 2.4* 2.4*   < > = values in this interval not displayed.   Recent Labs  Lab 12/28/19 0249  LIPASE 1,652*   Recent Labs  Lab 12/28/19 0934 12/29/19 0636  AMMONIA 77* 91*    ABG    Component Value Date/Time   HCO3 22.0 12/31/2019 0826   TCO2 33 (H) 12/28/2019 2217   ACIDBASEDEF 2.5 (H) 12/31/2019 0826   O2SAT 64.1 12/31/2019 0826     Coagulation Profile: No results for input(s): INR, PROTIME in the last 168 hours.  Cardiac Enzymes: No results for input(s): CKTOTAL, CKMB, CKMBINDEX, TROPONINI in the last 168 hours.  HbA1C: Hemoglobin A1C  Date/Time Value Ref Range Status  10/25/2019 08:58 AM 9.7 (A) 4.0 - 5.6 % Final  04/04/2019 11:07 AM 9.8 (A) 4.0 - 5.6 % Final   Hgb A1c MFr Bld  Date/Time Value Ref Range Status  01/01/2020 11:00 PM >15.5 (H) 4.8 - 5.6 % Final    Comment:    (NOTE) **Verified by repeat analysis**         Prediabetes: 5.7 - 6.4          Diabetes: >6.4         Glycemic control for adults with diabetes: <7.0   09/05/2017 06:00 PM 9.6 (H) 4.8 - 5.6 % Final    Comment:    (NOTE) Pre diabetes:          5.7%-6.4% Diabetes:              >6.4% Glycemic control for   <7.0% adults with diabetes     CBG: Recent Labs  Lab 01/01/20 2340 01/02/20 0148 01/02/20 0349 01/02/20 0647 01/02/20 0817  GLUCAP 160* 140* 165* 168* 157*    Review of Systems:   Unable to obtain secondary to mental status  Past Medical History  She,  has a past medical history of Anxiety, Asthma, Chronic headache, Chronic lower back pain, Depression, Essential hypertension, Family history of adverse reaction to anesthesia, GERD (gastroesophageal reflux disease), Manic state ([place]), Migraine, Obesity, OSA on CPAP, Osteoarthritis, knee, Pneumonia, Prolonged QT syndrome, PTSD (post-traumatic stress disorder), Rheumatoid arthritis ([place]), Seizures ([place]), Severe needle phobia, and Type II diabetes mellitus ([place]).   Surgical History    Past Surgical History:  Procedure Laterality Date  . TONSILLECTOMY  1994     Social History   reports that she has never smoked. She has never used smokeless tobacco. She reports previous alcohol use. She reports that she does not use drugs.  Family History   Her family history includes Anxiety disorder in her mother; Depression in her mother; Diabetes in her father and paternal uncle; Heart disease in her paternal grandfather; Hypertension in her maternal grandfather, maternal grandmother, paternal grandfather, and paternal grandmother; Hypothyroidism in her mother; Pancreatic cancer in her maternal grandfather.    Gerrit Halls, Medical Student

## 2020-01-02 NOTE — Progress Notes (Signed)
[facility] ROUNDING NOTE   Subjective:   [name] diabetes rheumatoid arthritis chronic CellCept possible seizure disorder presented 01/14/2020 with altered mental status seizures and diabetic ketoacidosis.  She developed acute kidney injury  and anuric renal failure.  Lactic acid level was 10.8.  Her baseline creatinine appears to be in the normal range.  The etiology of her renal failure appears to be secondary to ischemic acute tubular necrosis in setting of septic shock and diabetes ketoacidosis.  Renal ultrasound showed normal right and left kidney with an unremarkable CT scan.  CRRT was initiated 12/29/2019.  She is being kept even on CRRT  Blood pressure 94/55 pulse 96 temperature 98.3 O2 sats 100% FiO2 40%  Sodium 137 potassium 4.5 chloride 100 CO2 20 BUN 27 creatinine 2.56 glucose 167 calcium 8 phosphorus 3.7 magnesium 2.9 albumin 2.4 AST 239 ALT 186 WBC 18.5 hemoglobin  12.27 platelets 94  Keppra 1 g twice daily, Protonix 40 mg daily  Protonix 40 mg daily IV fentanyl IV insulin IV norepinephrine and IV vasopressin IV Keppra IV Zosyn 3.375 g every 6 hours IV Vanco  CT scan performed with IV contrast 01/01/2020 due to medical necessity.  No evidence of pulmonary embolus bilateral lower lobe airspace disease consistent with bilateral pneumonia endotracheal tube in place hepatic steatosis    Objective:  Vital signs in last 24 hours:  Temp:  [97.9 F (36.6 C)-102 F (38.9 C)] 98.3 F (36.8 C) (06/17 0355) Pulse Rate:  [41-132] 94 (06/17 0600) Resp:  [23-54] 33 (06/17 0615) BP: (76-153)/(38-117) 94/55 (06/17 0615) SpO2:  [92 %-100 %] 100 % (06/17 0600) FiO2 (%):  [40 %] 40 % (06/17 0348) Weight:  [177.1 kg] 177.1 kg (06/17 0430)  Weight change: -4.1 kg Filed Weights   12/31/19 0500 01/01/20 0500 01/02/20 0430  Weight: (!) 180.3 kg (!) 181.2 kg (!) 177.1 kg    Intake/Output: I/O last 3 completed shifts: In: 6994.4 [I.V.:1625.2;  NG/GT:1840; IV Piggyback:3529.2] Out: 0177 [Emesis/NG output:1650; LTJQZ:0092]   Intake/Output this shift:  Total I/O In: 922 [I.V.:772; NG/GT:50; IV Piggyback:100] Out: 2508 [Emesis/NG output:700; Other:1808]     General exam: Ill-looking female sedated and intubated Respiratory system: Coarse mechanical breath sound bilateral Cardiovascular system: S1 & S2 heard, RRR.  Edema present in all extremities. Gastrointestinal system: Abdomen is distended, soft. Central nervous system: Sedated and not responding Extremities: Edematous, no cyanosis or clubbing Skin: No rashes, lesions or ulcers Psychiatry: Unable to assess as patient is sedated.   Basic Metabolic Panel: Recent Labs  Lab 12/30/19 1545 12/30/19 1545 12/31/19 0509 12/31/19 0509 12/31/19 1600 12/31/19 1600 01/01/20 0502 01/01/20 1537 01/02/20 0412  NA 138   < > 136  --  135  --  135 137 137  K 3.8   < > 3.2*  --  3.5  --  4.5 5.0 4.5  CL 101   < > 99  --  98  --  99 102 100  CO2 23   < > 21*  --  21*  --  20* 22 20*  GLUCOSE 183*   < > 200*  --  228*  --  149* 173* 167*  BUN 23*   < > 24*  --  23*  --  23* 29* 27*  CREATININE 3.01*   < > 2.62*  --  2.39*  --  2.28* 2.86* 2.56*  CALCIUM 7.4*   < > 7.7*   < > 7.9*   < > 8.3* 7.5*  8.0*  MG 2.1  --  2.4  --  2.4  --  2.9*  --  2.9*  PHOS 1.7*   < > 1.3*  --  1.7*  --  2.3* 3.7 3.7   < > = values in this interval not displayed.    Liver Function Tests: Recent Labs  Lab 12/19/2019 2023 01/12/2020 2023 12/29/19 0046 12/29/19 1532 12/31/19 0509 12/31/19 1600 01/01/20 0502 01/01/20 1537 01/02/20 0412  AST 76*  --  6,313*  --  473*  --  291*  --  239*  ALT 101*  --  1,153*  --  353*  --  262*  --  186*  ALKPHOS 369*  --  158*  --  256*  --  434*  --  596*  BILITOT 1.9*  --  2.4*  --  11.6*  --  15.1*  --  16.0*  PROT 8.3*  --  5.4*  --  5.9*  --  6.2*  --  5.7*  ALBUMIN 4.0   < > 2.6*   < > 3.2* 3.4* 3.0* 2.4* 2.4*   < > = values in this interval not displayed.    Recent Labs  Lab 12/28/19 0249  LIPASE 1,652*   Recent Labs  Lab 12/28/19 0934 12/29/19 0636  AMMONIA 77* 91*    CBC: Recent Labs  Lab 01/07/2020 2004 12/23/2019 2059 12/28/19 0249 12/28/19 2217 12/29/19 0046 01/01/20 0502 01/02/20 0412  WBC 10.7*  --  14.3*  --  9.3 8.2 18.5*  NEUTROABS 7.9*  --   --   --  6.2  --   --   HGB 18.9*   < > 17.0* 17.3* 15.9* 12.3 12.7  HCT 67.2*   < > 56.8* 51.0* 56.4* 42.4 43.2  MCV 104.0*  --  98.6  --  103.5* 101.0* 99.8  PLT 250  --  201  --  113* 76* 94*   < > = values in this interval not displayed.    Cardiac Enzymes: No results for input(s): CKTOTAL, CKMB, CKMBINDEX, TROPONINI in the last 168 hours.  BNP: Invalid input(s): POCBNP  CBG: Recent Labs  Lab 01/01/20 1950 01/01/20 2146 01/01/20 2340 01/02/20 0148 01/02/20 0349  GLUCAP 165* 151* 160* 140* 165*    Microbiology: Results for orders placed or performed during the hospital encounter of 12/28/2019  SARS Coronavirus 2 by RT PCR (hospital order, performed in [facility] hospital lab) Nasopharyngeal Nasopharyngeal Swab     Status: None   Collection Time: 01/07/2020  9:38 PM   Specimen: Nasopharyngeal Swab  Result Value Ref Range Status   SARS Coronavirus 2 NEGATIVE NEGATIVE Final    Comment: (NOTE) SARS-CoV-2 target nucleic acids are NOT DETECTED.  The SARS-CoV-2 RNA is generally detectable in upper and lower respiratory specimens during the acute phase of infection. The lowest concentration of SARS-CoV-2 viral copies this assay can detect is 250 copies / mL. A negative result does not preclude SARS-CoV-2 infection and should not be used as the sole basis for treatment or other patient management decisions.  A negative result may occur with improper specimen collection / handling, submission of specimen other than nasopharyngeal swab, presence of viral mutation(s) within the areas targeted by this assay, and inadequate number of viral copies (<250 copies / mL). A  negative result must be combined with clinical observations, patient history, and epidemiological information.  Fact Sheet for Patients:   StrictlyIdeas.no  Fact Sheet for Healthcare Providers: BankingDealers.co.za  This test is not  yet approved or  cleared by the [place] and has been authorized for detection and/or diagnosis of SARS-CoV-2 by FDA under an Emergency Use Authorization (EUA).  This EUA will remain in effect (meaning this test can be used) for the duration of the COVID-19 declaration under Section 564(b)(1) of the Act, 21 U.S.C. section 360bbb-3(b)(1), unless the authorization is terminated or revoked sooner.  Performed at [facility], [place] [address] [place], [place] [address]   MRSA PCR Screening     Status: None   Collection Time: 12/28/19  1:07 AM   Specimen: Nasal Mucosa; Nasopharyngeal  Result Value Ref Range Status   MRSA by PCR NEGATIVE NEGATIVE Final    Comment:        The GeneXpert MRSA Assay (FDA approved for NASAL specimens only), is one component of a comprehensive MRSA colonization surveillance program. It is not intended to diagnose MRSA infection nor to guide or monitor treatment for MRSA infections. Performed at [facility], [place] [address], [place], [place] [address]   Culture, Urine     Status: None   Collection Time: 12/28/19  1:15 AM   Specimen: Urine, Catheterized  Result Value Ref Range Status   Specimen Description URINE, CATHETERIZED  Final   Special Requests NONE  Final   Culture   Final    NO GROWTH Performed at [facility] [address], [place], [place] [address]    Report Status 12/29/2019 FINAL  Final  Culture, blood (routine x 2)     Status: None (Preliminary result)   Collection Time: 12/28/19  2:49 AM   Specimen: BLOOD  Result Value Ref Range Status   Specimen Description BLOOD LEFT ANTECUBITAL  Final   Special Requests   Final    BOTTLES  DRAWN AEROBIC ONLY Blood Culture adequate volume   Culture   Final    NO GROWTH 4 DAYS Performed at [facility], [place] [address], [place], [place] [address]    Report Status PENDING  Incomplete  Culture, blood (routine x 2)     Status: None (Preliminary result)   Collection Time: 12/28/19  2:49 AM   Specimen: BLOOD RIGHT HAND  Result Value Ref Range Status   Specimen Description BLOOD RIGHT HAND  Final   Special Requests   Final    BOTTLES DRAWN AEROBIC ONLY Blood Culture adequate volume   Culture   Final    NO GROWTH 4 DAYS Performed at [facility], [place] [address], [place],  [address]    Report Status PENDING  Incomplete    Coagulation Studies: No results for input(s): LABPROT, INR in the last 72 hours.  Urinalysis: No results for input(s): COLORURINE, LABSPEC, PHURINE, GLUCOSEU, HGBUR, BILIRUBINUR, KETONESUR, PROTEINUR, UROBILINOGEN, NITRITE, LEUKOCYTESUR in the last 72 hours.  Invalid input(s): APPERANCEUR    Imaging: CT HEAD WO CONTRAST  Result Date: 01/01/2020 CLINICAL DATA:  Seizure EXAM: CT HEAD WITHOUT CONTRAST TECHNIQUE: Contiguous axial images were obtained from the base of the skull through the vertex without intravenous contrast. COMPARISON:  December 27, 2019 and Nov 25, 2017 FINDINGS: Brain: The ventricles and sulci appear normal in size and configuration. There is no intracranial mass, hemorrhage, extra-axial fluid collection, or midline shift. Brain parenchyma appears unremarkable. No acute infarct is appreciable. Vascular: No hyperdense vessels.  No evident vascular calcification. Skull: Areas of parietal bone thinning bilaterally noted. No fracture evident. No blastic or lytic bone lesions. Sinuses/Orbits: Opacification is noted in  multiple ethmoid air cells as well as in the sphenoid sinus regions with exacerbation due to intubation. Orbits appear symmetric bilaterally. Other: Opacification in multiple mastoid air cells noted. IMPRESSION: Brain  parenchyma appears unremarkable.  No mass or hemorrhage. Opacification in multiple ethmoid of paranasal sinus disease which may well be exacerbated by intubation. Opacification of multiple mastoid air cells bilaterally may also be exacerbated by intubation. Electronically Signed   By: Lowella Grip III M.D.   On: 01/01/2020 12:30   CT ANGIO CHEST PE W OR WO CONTRAST  Result Date: 01/01/2020 CLINICAL DATA:  Respiratory distress EXAM: CT ANGIOGRAPHY CHEST WITH CONTRAST TECHNIQUE: Multidetector CT imaging of the chest was performed using the standard protocol during bolus administration of intravenous contrast. Multiplanar CT image reconstructions and MIPs were obtained to evaluate the vascular anatomy. CONTRAST:  62m OMNIPAQUE IOHEXOL 350 MG/ML SOLN COMPARISON:  Chest radiograph December 31, 2019; chest CT April 09, 2018 FINDINGS: Cardiovascular: There is no demonstrable pulmonary embolus. There is no thoracic aortic aneurysm or dissection. Visualized great vessels appear normal. No pericardial effusion or pericardial thickening evident. Mediastinum/Nodes: Visualized thyroid appears normal. There is extensive anterior and middle mediastinal fat. No adenopathy is appreciable in the thoracic region. A nasogastric tube passes through the esophagus into the stomach. No esophageal lesions evident on this study. Lungs/Pleura: There is an endotracheal tube with tip in the distal trachea. No pneumothorax. There is airspace consolidation in both lower lobes. Lungs elsewhere clear. No appreciable edema or pleural effusions. Upper Abdomen: There is diffuse hepatic steatosis. Visualized upper abdominal structures otherwise appear unremarkable. Musculoskeletal: No blastic or lytic bone lesions evident. No fracture or dislocation. No chest wall lesions evident. Review of the MIP images confirms the above findings. IMPRESSION: 1. No demonstrable pulmonary embolus. No thoracic aortic aneurysm or dissection. 2. Bilateral  lower lobe airspace opacity consistent with bilateral pneumonia. Upper lung regions are clear. No pleural effusions are appreciable. 3. Endotracheal tube tip in distal trachea. No pneumothorax. Major airways appear patent. 4.  Nasogastric tube extends into the stomach. 5.  No adenopathy. 6.  Hepatic steatosis. Electronically Signed   By: WLowella GripIII M.D.   On: 01/01/2020 12:26   DG CHEST PORT 1 VIEW  Result Date: 12/31/2019 CLINICAL DATA:  Hypoxia EXAM: PORTABLE CHEST 1 VIEW COMPARISON:  December 29, 2019 FINDINGS: Endotracheal tube tip is 1.7 cm above the carina. Nasogastric tube tip and side port are below the diaphragm. Right jugular catheter tip is in the superior vena cava. Left central catheter tip is in the right atrium slightly beyond the cavoatrial junction. No pneumothorax. There is bibasilar atelectasis. Lungs elsewhere are clear. Heart size and pulmonary vascularity are normal. No adenopathy. No bone lesions. IMPRESSION: Tube and catheter positions as described without pneumothorax. Bibasilar atelectasis. Lungs otherwise clear. Cardiac silhouette within normal limits. Electronically Signed   By: WLowella GripIII M.D.   On: 12/31/2019 12:06     Medications:   .  prismasol BGK 4/2.5 500 mL/hr at 01/02/20 0518  .  prismasol BGK 4/2.5 300 mL/hr at 01/01/20 1017  . sodium chloride Stopped (12/28/19 1117)  . sodium chloride Stopped (12/29/19 0755)  . feeding supplement (VITAL HIGH PROTEIN) 1,000 mL (12/31/19 1045)  . fentaNYL infusion INTRAVENOUS 100 mcg/hr (01/02/20 0600)  . heparin 999 mL/hr at 12/31/19 1656  . insulin 6 mL/hr at 01/02/20 0600  . norepinephrine (LEVOPHED) Adult infusion 20 mcg/min (01/02/20 0600)  . piperacillin-tazobactam Stopped (01/02/20 0549)  . prismasol BGK 4/2.5 1,500 mL/hr  at 01/02/20 0551  . sodium chloride    . vancomycin Stopped (01/01/20 1111)  . vasopressin (PITRESSIN) infusion - *FOR SHOCK* 0.04 Units/min (01/02/20 0600)   . B-complex with  vitamin C  1 tablet Per Tube Daily  . chlorhexidine gluconate (MEDLINE KIT)  15 mL Mouth Rinse BID  . Chlorhexidine Gluconate Cloth  6 each Topical Q0600  . docusate  100 mg Per Tube BID  . feeding supplement (PRO-STAT SUGAR FREE 64)  30 mL Per Tube Daily  . heparin  5,000 Units Subcutaneous Q8H  . levETIRAcetam  1,000 mg Per Tube BID  . mouth rinse  15 mL Mouth Rinse 10 times per day  . pantoprazole sodium  40 mg Per Tube Daily  . polyethylene glycol  17 g Per Tube Daily  . sodium chloride flush  10-40 mL Intracatheter Q12H   sodium chloride, dextrose, fentaNYL, heparin, heparin, midazolam, sodium chloride flush  Assessment/ Plan:   Acute kidney injury secondary ischemic ATN in setting of septic shock diabetic ketoacidosis anuric.  CT scan and ultrasound showed no evidence of hydronephrosis CRRT initiated 12/29/2019.  We will continue with no change  Hypotension/shock IV vasopressin added 01/01/2020 to Levophed.  Kept even on CRRT.  Ventilator dependent respiratory failure  Lactic acidosis secondary to septic shock  Diabetes mellitus with ketoacidosis.  IV insulin  Encephalopathy generalized continuous slowing and triphasic waves MRI of brain ordered with continuous EEG management continues on Keppra appreciate assistance from neurology  Septic shock IV vancomycin and Zosyn source appears to be unclear  Hypokalemia improved.    Hypophosphatemia improved    LOS: [place] '@TODAY''@6'$ :44 AM

## 2020-01-02 NOTE — Progress Notes (Signed)
eLink Physician-Brief Progress Note Patient Name: ALLY KNODEL DOB: 07-01-1986 MRN: [id number]   Date of Service  01/02/2020  HPI/Events of Note  Febrile to 102.0 yesterday and WBC = 18.5. Nursing request for blood cultures.   eICU Interventions  Plan: 1. Blood cultures x 2.     Intervention Category Major Interventions: Infection - evaluation and management  Dorothye Berni Eugene 01/02/2020, 6:11 AM

## 2020-01-03 ENCOUNTER — Inpatient Hospital Stay (HOSPITAL_COMMUNITY): Payer: Medicaid Other

## 2020-01-03 LAB — GLUCOSE, CAPILLARY
Glucose-Capillary: 146 mg/dL — ABNORMAL HIGH (ref 70–99)
Glucose-Capillary: 147 mg/dL — ABNORMAL HIGH (ref 70–99)
Glucose-Capillary: 152 mg/dL — ABNORMAL HIGH (ref 70–99)
Glucose-Capillary: 152 mg/dL — ABNORMAL HIGH (ref 70–99)
Glucose-Capillary: 159 mg/dL — ABNORMAL HIGH (ref 70–99)
Glucose-Capillary: 160 mg/dL — ABNORMAL HIGH (ref 70–99)
Glucose-Capillary: 160 mg/dL — ABNORMAL HIGH (ref 70–99)
Glucose-Capillary: 161 mg/dL — ABNORMAL HIGH (ref 70–99)
Glucose-Capillary: 164 mg/dL — ABNORMAL HIGH (ref 70–99)
Glucose-Capillary: 172 mg/dL — ABNORMAL HIGH (ref 70–99)
Glucose-Capillary: 176 mg/dL — ABNORMAL HIGH (ref 70–99)
Glucose-Capillary: 189 mg/dL — ABNORMAL HIGH (ref 70–99)
Glucose-Capillary: 194 mg/dL — ABNORMAL HIGH (ref 70–99)
Glucose-Capillary: 208 mg/dL — ABNORMAL HIGH (ref 70–99)
Glucose-Capillary: 86 mg/dL (ref 70–99)

## 2020-01-03 LAB — RENAL FUNCTION PANEL
Albumin: 2 g/dL — ABNORMAL LOW (ref 3.5–5.0)
Albumin: 2.2 g/dL — ABNORMAL LOW (ref 3.5–5.0)
Anion gap: 17 — ABNORMAL HIGH (ref 5–15)
Anion gap: 18 — ABNORMAL HIGH (ref 5–15)
BUN: 25 mg/dL — ABNORMAL HIGH (ref 6–20)
BUN: 27 mg/dL — ABNORMAL HIGH (ref 6–20)
CO2: 19 mmol/L — ABNORMAL LOW (ref 22–32)
CO2: 18 mmol/L — ABNORMAL LOW (ref 22–32)
Calcium: 8.4 mg/dL — ABNORMAL LOW (ref 8.9–10.3)
Calcium: 8.3 mg/dL — ABNORMAL LOW (ref 8.9–10.3)
Chloride: 99 mmol/L (ref 98–111)
Chloride: 100 mmol/L (ref 98–111)
Creatinine, Ser: 2.23 mg/dL — ABNORMAL HIGH (ref 0.44–1.00)
Creatinine, Ser: 2.26 mg/dL — ABNORMAL HIGH (ref 0.44–1.00)
GFR calc Af Amer: 32 mL/min — ABNORMAL LOW (ref >60)
GFR calc Af Amer: 32 mL/min — ABNORMAL LOW (ref >60)
GFR calc non Af Amer: 28 mL/min — ABNORMAL LOW (ref >60)
GFR calc non Af Amer: 27 mL/min — ABNORMAL LOW (ref >60)
Glucose, Bld: 159 mg/dL — ABNORMAL HIGH (ref 70–99)
Glucose, Bld: 198 mg/dL — ABNORMAL HIGH (ref 70–99)
Phosphorus: 3.9 mg/dL (ref 2.5–4.6)
Phosphorus: 4.9 mg/dL — ABNORMAL HIGH (ref 2.5–4.6)
Potassium: 5.1 mmol/L (ref 3.5–5.1)
Potassium: 5.4 mmol/L — ABNORMAL HIGH (ref 3.5–5.1)
Sodium: 135 mmol/L (ref 135–145)
Sodium: 136 mmol/L (ref 135–145)

## 2020-01-03 LAB — MAGNESIUM: Magnesium: 2.7 mg/dL — ABNORMAL HIGH (ref 1.7–2.4)

## 2020-01-03 MED ORDER — ALBUMIN HUMAN 5 % IV SOLN
12.5000 g | Freq: Once | INTRAVENOUS | Status: AC
  Administered 2020-01-03: 12.5 g via INTRAVENOUS
  Filled 2020-01-03: qty 250

## 2020-01-03 MED ORDER — INSULIN ASPART 100 UNIT/ML ~~LOC~~ SOLN
3.0000 [IU] | SUBCUTANEOUS | Status: DC
  Administered 2020-01-03 (×3): 6 [IU] via SUBCUTANEOUS
  Administered 2020-01-03: 9 [IU] via SUBCUTANEOUS
  Administered 2020-01-04 (×4): 6 [IU] via SUBCUTANEOUS
  Administered 2020-01-04: 9 [IU] via SUBCUTANEOUS
  Administered 2020-01-04 – 2020-01-05 (×2): 6 [IU] via SUBCUTANEOUS
  Administered 2020-01-05 – 2020-01-06 (×5): 9 [IU] via SUBCUTANEOUS
  Administered 2020-01-06: 6 [IU] via SUBCUTANEOUS
  Administered 2020-01-06: 3 [IU] via SUBCUTANEOUS
  Administered 2020-01-06: 6 [IU] via SUBCUTANEOUS
  Administered 2020-01-06: 9 [IU] via SUBCUTANEOUS
  Administered 2020-01-06 (×2): 6 [IU] via SUBCUTANEOUS
  Administered 2020-01-07: 9 [IU] via SUBCUTANEOUS
  Administered 2020-01-07: 3 [IU] via SUBCUTANEOUS
  Administered 2020-01-07: 6 [IU] via SUBCUTANEOUS
  Administered 2020-01-07 (×3): 9 [IU] via SUBCUTANEOUS

## 2020-01-03 MED ORDER — INSULIN DETEMIR 100 UNIT/ML ~~LOC~~ SOLN
15.0000 [IU] | Freq: Two times a day (BID) | SUBCUTANEOUS | Status: DC
  Administered 2020-01-03 – 2020-01-04 (×4): 15 [IU] via SUBCUTANEOUS
  Filled 2020-01-03 (×6): qty 0.15

## 2020-01-03 NOTE — Progress Notes (Signed)
[facility] ROUNDING NOTE   Subjective:   [name] diabetes rheumatoid arthritis chronic CellCept possible seizure disorder presented 12/24/2019 with altered mental status seizures and diabetic ketoacidosis.  She developed acute kidney injury  and anuric renal failure.  Lactic acid level was 10.8.  Her baseline creatinine appears to be in the normal range.  The etiology of her renal failure appears to be secondary to ischemic acute tubular necrosis in setting of septic shock and diabetes ketoacidosis.  Renal ultrasound showed normal right and left kidney with an unremarkable CT scan.  CRRT was initiated 12/29/2019.  She is being kept even on CRRT  Blood pressure 88/49 pulse 98 temperature 99 O2 sats 97% FiO2 40%  Sodium 135 potassium 5.1 chloride 99 CO2 19 BUN 25 creatinine 2.23 glucose 159 calcium 8.4 phosphorus 3.9 magnesium 2.7 albumin 2.0  Keppra 1 g twice daily, Protonix 40 mg daily  Protonix 40 mg daily IV fentanyl IV insulin IV norepinephrine and IV vasopressin IV Keppra IV Zosyn 3.375 g every 6 hours IV Vanco  CT scan performed with IV contrast 01/01/2020 due to medical necessity.  No evidence of pulmonary embolus bilateral lower lobe airspace disease consistent with bilateral pneumonia endotracheal tube in place hepatic steatosis    Objective:  Vital signs in last 24 hours:  Temp:  [97.4 F (36.3 C)-99 F (37.2 C)] 99 F (37.2 C) (06/18 0600) Pulse Rate:  [84-103] 97 (06/18 0600) Resp:  [28-46] 35 (06/18 0600) BP: (65-106)/(27-77) 92/56 (06/18 0600) SpO2:  [93 %-100 %] 97 % (06/18 0600) FiO2 (%):  [40 %] 40 % (06/18 0247) Weight:  [177 kg] 177 kg (06/18 0404)  Weight change: -0.1 kg Filed Weights   01/01/20 0500 01/02/20 0430 01/03/20 0404  Weight: (!) 181.2 kg (!) 177.1 kg (!) 177 kg    Intake/Output: I/O last 3 completed shifts: In: 5055.6 [I.V.:2019.2; NG/GT:350; IV Piggyback:2686.4] Out: 6641 [Emesis/NG output:2800;  Other:3841]   Intake/Output this shift:  Total I/O In: 621.7 [I.V.:421.7; NG/GT:100; IV Piggyback:100] Out: 8250 [Emesis/NG output:500; Other:931]     General exam: Ill-looking female sedated and intubated Respiratory system: Coarse mechanical breath sound bilateral Cardiovascular system: S1 & S2 heard, RRR.  Edema present in all extremities. Gastrointestinal system: Abdomen is distended, soft. Central nervous system: Sedated and not responding Extremities: Edematous, no cyanosis or clubbing Skin: No rashes, lesions or ulcers Psychiatry: Unable to assess as patient is sedated.   Basic Metabolic Panel: Recent Labs  Lab 12/31/19 0509 12/31/19 0509 12/31/19 1600 12/31/19 1600 01/01/20 0502 01/01/20 0502 01/01/20 1537 01/01/20 1537 01/02/20 0412 01/02/20 0925 01/02/20 1513 01/03/20 0348  NA 136   < > 135   < > 135   < > 137  --  137 138 137 135  K 3.2*   < > 3.5   < > 4.5   < > 5.0  --  4.5 4.2 4.4 5.1  CL 99   < > 98   < > 99  --  102  --  100  --  100 99  CO2 21*   < > 21*   < > 20*  --  22  --  20*  --  20* 19*  GLUCOSE 200*   < > 228*   < > 149*  --  173*  --  167*  --  159* 159*  BUN 24*   < > 23*   < > 23*  --  29*  --  27*  --  24* 25*  CREATININE 2.62*   < > 2.39*   < > 2.28*  --  2.86*  --  2.56*  --  2.28* 2.23*  CALCIUM 7.7*   < > 7.9*   < > 8.3*   < > 7.5*   < > 8.0*  --  8.1* 8.4*  MG 2.4  --  2.4  --  2.9*  --   --   --  2.9*  --   --  2.7*  PHOS 1.3*   < > 1.7*   < > 2.3*  --  3.7  --  3.7  --  3.8 3.9   < > = values in this interval not displayed.    Liver Function Tests: Recent Labs  Lab 01/04/2020 2023 01/07/2020 2023 12/29/19 0046 12/29/19 1532 12/31/19 0509 12/31/19 1600 01/01/20 0502 01/01/20 1537 01/02/20 0412 01/02/20 1513 01/03/20 0348  AST 76*  --  6,313*  --  473*  --  291*  --  239*  --   --   ALT 101*  --  1,153*  --  353*  --  262*  --  186*  --   --   ALKPHOS 369*  --  158*  --  256*  --  434*  --  596*  --   --   BILITOT 1.9*  --   2.4*  --  11.6*  --  15.1*  --  16.0*  --   --   PROT 8.3*  --  5.4*  --  5.9*  --  6.2*  --  5.7*  --   --   ALBUMIN 4.0   < > 2.6*   < > 3.2*   < > 3.0* 2.4* 2.4* 2.1* 2.0*   < > = values in this interval not displayed.   Recent Labs  Lab 12/28/19 0249  LIPASE 1,652*   Recent Labs  Lab 12/28/19 0934 12/29/19 0636  AMMONIA 77* 91*    CBC: Recent Labs  Lab 12/23/2019 2004 12/24/2019 2059 12/28/19 0249 12/28/19 0249 12/28/19 2217 12/29/19 0046 01/01/20 0502 01/02/20 0412 01/02/20 0925  WBC 10.7*  --  14.3*  --   --  9.3 8.2 18.5*  --   NEUTROABS 7.9*  --   --   --   --  6.2  --   --   --   HGB 18.9*   < > 17.0*   < > 17.3* 15.9* 12.3 12.7 13.6  HCT 67.2*   < > 56.8*   < > 51.0* 56.4* 42.4 43.2 40.0  MCV 104.0*  --  98.6  --   --  103.5* 101.0* 99.8  --   PLT 250  --  201  --   --  113* 76* 94*  --    < > = values in this interval not displayed.    Cardiac Enzymes: No results for input(s): CKTOTAL, CKMB, CKMBINDEX, TROPONINI in the last 168 hours.  BNP: Invalid input(s): POCBNP  CBG: Recent Labs  Lab 01/03/20 0201 01/03/20 0253 01/03/20 0356 01/03/20 0458 01/03/20 0558  GLUCAP 160* 152* 147* 152* 159*    Microbiology: Results for orders placed or performed during the hospital encounter of 12/25/2019  SARS Coronavirus 2 by RT PCR (hospital order, performed in [facility] hospital lab) Nasopharyngeal Nasopharyngeal Swab     Status: None   Collection Time: 12/28/2019  9:38 PM   Specimen: Nasopharyngeal Swab  Result Value Ref Range Status   SARS Coronavirus 2  NEGATIVE NEGATIVE Final    Comment: (NOTE) SARS-CoV-2 target nucleic acids are NOT DETECTED.  The SARS-CoV-2 RNA is generally detectable in upper and lower respiratory specimens during the acute phase of infection. The lowest concentration of SARS-CoV-2 viral copies this assay can detect is 250 copies / mL. A negative result does not preclude SARS-CoV-2 infection and should not be used as the sole basis for  treatment or other patient management decisions.  A negative result may occur with improper specimen collection / handling, submission of specimen other than nasopharyngeal swab, presence of viral mutation(s) within the areas targeted by this assay, and inadequate number of viral copies (<250 copies / mL). A negative result must be combined with clinical observations, patient history, and epidemiological information.  Fact Sheet for Patients:   StrictlyIdeas.no  Fact Sheet for Healthcare Providers: BankingDealers.co.za  This test is not yet approved or  cleared by the [place] FDA and has been authorized for detection and/or diagnosis of SARS-CoV-2 by FDA under an Emergency Use Authorization (EUA).  This EUA will remain in effect (meaning this test can be used) for the duration of the COVID-19 declaration under Section 564(b)(1) of the Act, 21 U.S.C. section 360bbb-3(b)(1), unless the authorization is terminated or revoked sooner.  Performed at [facility], [place] [address] [place], [place] [address]   MRSA PCR Screening     Status: None   Collection Time: 12/28/19  1:07 AM   Specimen: Nasal Mucosa; Nasopharyngeal  Result Value Ref Range Status   MRSA by PCR NEGATIVE NEGATIVE Final    Comment:        The GeneXpert MRSA Assay (FDA approved for NASAL specimens only), is one component of a comprehensive MRSA colonization surveillance program. It is not intended to diagnose MRSA infection nor to guide or monitor treatment for MRSA infections. Performed at [facility], [place] [address] [place] [address]   Culture, Urine     Status: None   Collection Time: 12/28/19  1:15 AM   Specimen: Urine, Catheterized  Result Value Ref Range Status   Specimen Description URINE, CATHETERIZED  Final   Special Requests NONE  Final   Culture   Final    NO GROWTH Performed at [facility] [address],  [place], [place] [address]    Report Status 12/29/2019 FINAL  Final  Culture, blood (routine x 2)     Status: None   Collection Time: 12/28/19  2:49 AM   Specimen: BLOOD  Result Value Ref Range Status   Specimen Description BLOOD LEFT ANTECUBITAL  Final   Special Requests   Final    BOTTLES DRAWN AEROBIC ONLY Blood Culture adequate volume   Culture   Final    NO GROWTH 5 DAYS Performed at [facility], [place] [address], [place], [place] [address]    Report Status 01/02/2020 FINAL  Final  Culture, blood (routine x 2)     Status: None   Collection Time: 12/28/19  2:49 AM   Specimen: BLOOD RIGHT HAND  Result Value Ref Range Status   Specimen Description BLOOD RIGHT HAND  Final   Special Requests   Final    BOTTLES DRAWN AEROBIC ONLY Blood Culture adequate volume   Culture   Final    NO GROWTH 5 DAYS Performed at [facility], [place] [address] [place], [place] [address]    Report Status 01/02/2020 FINAL  Final  Culture, blood (routine x 2)  Status: None (Preliminary result)   Collection Time: 01/02/20  7:26 AM   Specimen: BLOOD RIGHT HAND  Result Value Ref Range Status   Specimen Description BLOOD RIGHT HAND  Final   Special Requests   Final    BOTTLES DRAWN AEROBIC ONLY Blood Culture adequate volume   Culture   Final    NO GROWTH <12 HOURS Performed at [facility], [address], [place], [place] [address]    Report Status PENDING  Incomplete  Culture, blood (routine x 2)     Status: None (Preliminary result)   Collection Time: 01/02/20  7:26 AM   Specimen: BLOOD RIGHT HAND  Result Value Ref Range Status   Specimen Description BLOOD RIGHT HAND  Final   Special Requests   Final    BOTTLES DRAWN AEROBIC ONLY Blood Culture adequate volume   Culture   Final    NO GROWTH <12 HOURS Performed at [facility], [address] [place], [place] [address]    Report Status PENDING  Incomplete    Coagulation Studies: No results for input(s): LABPROT, INR in  the last 72 hours.  Urinalysis: No results for input(s): COLORURINE, LABSPEC, PHURINE, GLUCOSEU, HGBUR, BILIRUBINUR, KETONESUR, PROTEINUR, UROBILINOGEN, NITRITE, LEUKOCYTESUR in the last 72 hours.  Invalid input(s): APPERANCEUR    Imaging: CT HEAD WO CONTRAST  Result Date: 01/01/2020 CLINICAL DATA:  Seizure EXAM: CT HEAD WITHOUT CONTRAST TECHNIQUE: Contiguous axial images were obtained from the base of the skull through the vertex without intravenous contrast. COMPARISON:  December 27, 2019 and Nov 25, 2017 FINDINGS: Brain: The ventricles and sulci appear normal in size and configuration. There is no intracranial mass, hemorrhage, extra-axial fluid collection, or midline shift. Brain parenchyma appears unremarkable. No acute infarct is appreciable. Vascular: No hyperdense vessels.  No evident vascular calcification. Skull: Areas of parietal bone thinning bilaterally noted. No fracture evident. No blastic or lytic bone lesions. Sinuses/Orbits: Opacification is noted in multiple ethmoid air cells as well as in the sphenoid sinus regions with exacerbation due to intubation. Orbits appear symmetric bilaterally. Other: Opacification in multiple mastoid air cells noted. IMPRESSION: Brain parenchyma appears unremarkable.  No mass or hemorrhage. Opacification in multiple ethmoid of paranasal sinus disease which may well be exacerbated by intubation. Opacification of multiple mastoid air cells bilaterally may also be exacerbated by intubation. Electronically Signed   By: Lowella Grip III M.D.   On: 01/01/2020 12:30   CT ANGIO CHEST PE W OR WO CONTRAST  Result Date: 01/01/2020 CLINICAL DATA:  Respiratory distress EXAM: CT ANGIOGRAPHY CHEST WITH CONTRAST TECHNIQUE: Multidetector CT imaging of the chest was performed using the standard protocol during bolus administration of intravenous contrast. Multiplanar CT image reconstructions and MIPs were obtained to evaluate the vascular anatomy. CONTRAST:  42m  OMNIPAQUE IOHEXOL 350 MG/ML SOLN COMPARISON:  Chest radiograph December 31, 2019; chest CT April 09, 2018 FINDINGS: Cardiovascular: There is no demonstrable pulmonary embolus. There is no thoracic aortic aneurysm or dissection. Visualized great vessels appear normal. No pericardial effusion or pericardial thickening evident. Mediastinum/Nodes: Visualized thyroid appears normal. There is extensive anterior and middle mediastinal fat. No adenopathy is appreciable in the thoracic region. A nasogastric tube passes through the esophagus into the stomach. No esophageal lesions evident on this study. Lungs/Pleura: There is an endotracheal tube with tip in the distal trachea. No pneumothorax. There is airspace consolidation in both lower lobes. Lungs elsewhere clear. No appreciable edema or pleural effusions. Upper Abdomen: There is diffuse hepatic steatosis. Visualized  upper abdominal structures otherwise appear unremarkable. Musculoskeletal: No blastic or lytic bone lesions evident. No fracture or dislocation. No chest wall lesions evident. Review of the MIP images confirms the above findings. IMPRESSION: 1. No demonstrable pulmonary embolus. No thoracic aortic aneurysm or dissection. 2. Bilateral lower lobe airspace opacity consistent with bilateral pneumonia. Upper lung regions are clear. No pleural effusions are appreciable. 3. Endotracheal tube tip in distal trachea. No pneumothorax. Major airways appear patent. 4.  Nasogastric tube extends into the stomach. 5.  No adenopathy. 6.  Hepatic steatosis. Electronically Signed   By: Lowella Grip III M.D.   On: 01/01/2020 12:26     Medications:     prismasol BGK 4/2.5 500 mL/hr at 01/03/20 0135    prismasol BGK 4/2.5 300 mL/hr at 01/03/20 0136   sodium chloride Stopped (12/28/19 1117)   sodium chloride Stopped (12/29/19 0755)   feeding supplement (VITAL HIGH PROTEIN) Stopped (01/02/20 2302)   fentaNYL infusion INTRAVENOUS 50 mcg/hr (01/03/20 0600)    heparin Stopped (01/02/20 2100)   insulin 3.4 mL/hr at 01/03/20 0600   norepinephrine (LEVOPHED) Adult infusion 16 mcg/min (01/03/20 0600)   piperacillin-tazobactam Stopped (01/03/20 0554)   prismasol BGK 4/2.5 1,500 mL/hr at 01/03/20 0502   sodium chloride     vasopressin (PITRESSIN) infusion - *FOR SHOCK* 0.04 Units/min (01/03/20 0600)    B-complex with vitamin C  1 tablet Per Tube Daily   chlorhexidine gluconate (MEDLINE KIT)  15 mL Mouth Rinse BID   Chlorhexidine Gluconate Cloth  6 each Topical Q0600   docusate  100 mg Per Tube BID   feeding supplement (PRO-STAT SUGAR FREE 64)  30 mL Per Tube Daily   heparin  5,000 Units Subcutaneous Q8H   levETIRAcetam  1,000 mg Per Tube BID   mouth rinse  15 mL Mouth Rinse 10 times per day   pantoprazole sodium  40 mg Per Tube Daily   polyethylene glycol  17 g Per Tube Daily   sodium chloride flush  10-40 mL Intracatheter Q12H   sodium chloride, dextrose, fentaNYL, heparin, heparin, midazolam, sodium chloride flush  Assessment/ Plan:   Acute kidney injury secondary ischemic ATN in setting of septic shock diabetic ketoacidosis anuric.  CT scan and ultrasound showed no evidence of hydronephrosis CRRT initiated 12/29/2019.  We will continue with no change  Hypotension/shock IV vasopressin added 01/01/2020 to Levophed.  Kept even on CRRT.  Ventilator dependent respiratory failure  Lactic acidosis secondary to septic shock  Diabetes mellitus with ketoacidosis.  IV insulin  Encephalopathy generalized continuous slowing and triphasic waves.  Repeat CT scan of head unremarkable 01/02/2020.  Appreciate assistance of Dr. Rory Percy.  Patient unable to receive MRI due to body habitus continues on Keppra appreciate assistance from neurology  Septic shock IV vancomycin and Zosyn source appears to be unclear  Hypokalemia improved.    Hypophosphatemia improved    LOS: 7 Courtney Zamora '@TODAY''@6'$ :41 AM

## 2020-01-03 NOTE — Progress Notes (Signed)
Verbal order by Dr. Denese Killings to turn off insulin gtt despite endo tool recommendations and start subq insulin.

## 2020-01-03 NOTE — Progress Notes (Signed)
NAME:  Courtney Zamora, MRN:  [id number], DOB:  Nov 11, 1985, LOS: 7 ADMISSION DATE:  12-30-2019, CONSULTATION DATE:  01/03/20 REFERRING MD:  EDP, CHIEF COMPLAINT:  seizures   Brief History   34 y.o. F with PMH of RA, Type 2 DM, possible seizures, HTN who presented with shaking of abnormal jerking movements thought to be seizures, AMS and hyperglycemia.  Pt loaded with Keppra, head CT negative, also developed fever and had a significant lactic acidosis.  Given AMS, PCCM consulted for admission.  History of present illness   Courtney Zamora is a  RA on cellcept, Type 2 DM, possible seizures (no prior EEG and no medications per husband), HTN who presented with shaking of abnormal jerking movements thought to be seizures, AMS and hyperglycemia.   He reports that about two days ago she was having trouble holding a cup because of weakness and was only eating fruit because everything tasted metallic.  He states her blood sugar was high, but it usually is.  She had some constipation, but did not complain of abdominal pain, diarrhea, nausea, vomiting or fever.  Earlier in the day, he noted her L arm shaking, he thought she was having a seizure, though this was different than her usual seizure.  These he describes as being diagnosed by a PCP and not on any medication.  She was sometimes scratch her face and pull her hair and then be fine immediately afterwards, she does not seek medical care after these episodes.  In the ED, pt was having continued twitching/jerking of the L side and was loaded with Keppra. CT head negative.  ED course significant for worsening mental status, oxygen requirement, lactic acid 10.8, K 2.4, Glucose >1100, anion gap 25, Co2 34.  Pt was able to intermittently answer questions and c/o abdominal pain.   She was given K and 3L IVF, she then spiked a fever to 101F.  Pt seen by neurology and EEG did not show evidence of seizures.   PCCM consulted for admission  Past Medical History    has a past medical history of Anxiety, Asthma, Chronic headache, Chronic lower back pain, Depression, Essential hypertension, Family history of adverse reaction to anesthesia, GERD (gastroesophageal reflux disease), Manic state (HCC), Migraine, Obesity, OSA on CPAP, Osteoarthritis, knee, Pneumonia, Prolonged QT syndrome, PTSD (post-traumatic stress disorder), Rheumatoid arthritis (HCC), Seizures (HCC), Severe needle phobia, and Type II diabetes mellitus (HCC).   Significant Hospital Events   6/11 Admit to [facility] 6/13 still profoundly sick, septic requiring 3 pressors   Consults:  neurology Renal: CRRT  Procedures:  6/12 endotracheal intubation 6/12 central line placement in the right IJ 6/13 left IJ central line placement 6/13 right IJ hemodialysis catheter 6/18 left foot arterial line placement  Significant Diagnostic Tests:  6/11 CT head>> limited due to patient motion, no acute findings 6/11 CT abd/pelvis>> There is some mild fat stranding about the pancreas, which may represent acute pancreatitis. There is some mild wall thickening of the duodenum, which may be reactive or secondary to duodenitis. 6/12 chest x-ray following IJ and endotracheal tube-line in good placement, ET tube adequately placed OG tube adequately placed  Micro Data:  6/12 BCx2 >> no growth x4 days  6/12 UC>> NG 6/16 BCx2 >> NG@ 24h Antimicrobials:  Zosyn 6/12-6/18 Vancomycin 6/12- 6/17  Interim history/subjective:   Sedated this morning, not following commands  But withdrawals form pain. Patient remains on vasopressin and NE. CRRT running even. Pt continues to breath at >35 RR per  min.   Patient continues to have emesis to suction and has not had a bowel movement this admission. We will obtain an abdominal xray this morning and escalate bowel regimen.   Patient continues to have low MAPs. Multiple A lines have been attempted during this admission without success. An arterial line was successfully placed in  the left foot which confirmed MAPs in the 40s-50s. We began fluid resuscitation with 5% albumin. CRRT set to net even.     Objective   Blood pressure (!) 101/55, pulse 93, temperature 99.1 F (37.3 C), resp. rate (!) 38, height 5\' 4"  (1.626 m), weight (!) 177 kg, SpO2 97 %. CVP:  [7 mmHg-11 mmHg] 11 mmHg  Vent Mode: PSV;CPAP FiO2 (%):  [40 %] 40 % Set Rate:  [16 bmp] 16 bmp Vt Set:  [440 mL] 440 mL PEEP:  [8 cmH20] 8 cmH20 Pressure Support:  [10 cmH20] 10 cmH20 Plateau Pressure:  [26 cmH20] 26 cmH20   Intake/Output Summary (Last 24 hours) at 01/03/2020 1039 Last data filed at 01/03/2020 1000 Gross per 24 hour  Intake 1305.75 ml  Output 2800 ml  Net -1494.25 ml   Filed Weights   01/01/20 0500 01/02/20 0430 01/03/20 0404  Weight: (!) 181.2 kg (!) 177.1 kg (!) 177 kg    General:  Obese F, comatose on vent HEENT: MM pink/moist, scleral icterus present, endotracheal tube in place Neuro: Does not follow commands in any extremities, does withdrawal from pain CV: S1-S2 appreciated PULM: Coarse breath sounds bilaterally  GI: soft, obese, bsx4 active   Resolved Hospital Problem list     Assessment & Plan:  34 y.o. F who presented with possible seizures and AMS,  found to have hyperglycemia, possible sepsis and pancreatitis   DKA/HHS Initial glucose >1100 with gap of 25, pH 7.4. Blood sugar is normalizing now on endotool. Bicarb stable at 20 this morning. Suspect intravascular volume depletion given presentation with suspected DKA. We will continue fluid replacement and minimize volume removal with CRRT. The patient lost peripheral IV access at the site she was receiving insulin gtt, give reduced insulin requirements with endo tool, we will switch her to SSI and subq insulin at this time.  -SSI, sq insulin -Continue to follow aggressively with q2 CBG checks  Jaundice - Jaundice on exam appears to be improving. Most likely sequelae of severe sepsis and critical illness.   - Continue  to monitor  Thrombocytopenia - Continuing to hold heparin Could also be sequelae of her severe sepsis or could be drug-induced due to zosyn.  - Continue to monitor with daily CBCs - Maintain SCDs   Septic Shock - Arterial line placed on 6/18. Given multiple negative blood cultures and negative urine culture, we will discontinue antibiotic today. We will attempt intravascular resuscitation with 5% albumin.  -follow lactic acid-trending down - Stop antibiotics - New Blood cx drawn 6/16 given fever to 102 and WBC to 18.5 still NG  -Patient continues to require norepinephrine and vasopressin today. Still no clear source of infection   Acute Hypoxic Respiratory Failure Possibly due to AMS, CXR with vascular congestion and cardiomegaly.  Last Echo in 2019 with preserved EF. RR downtrending to the 30s today. Chest CT yesterday negative for PE, showed possible bibasilar PNA vs atelectasis. Will continue to monitor respiratory status and wean from vent as tolerated. VBG yesterday did not show signs of hypercarbia. The patient continues to be sedated this morning, not able to follow commands. We have placed an arterial line  for more accurate pressure monitoring  - Continuous MAP monitoring - Keep head of bed elevated to at least 45 degrees - minimize fluid removal on CRRT; patient has a narrow pulse pressure and is intravascularly dry -Repeat echocardiogram performed 12/29/2019-poor window, LV EF ~55% w/out regional wall motion abnormalities; LV cavity normal in size, no LVH. RV not visualized. IVC w/ >50% respiratory variability.  -Continue mechanical ventilation to maintain O2 sat >93% -Wean as tolerated  Abdominal pain and possible pancreatitis Peri-pancreatic stranding on CT. Repeat LFTs down-trendings, suggest resolving shock liver. Pt has not had a bowel movement this admission despite bowel regimen. We will obtain an abdominal xray this morning and likely start a more aggressive bowel regimen this  afternoon given the risk for ileus due to critical illness and high dose narcotics for sedation -Lipase elevated to 1600 on admission -UA on admission unremarkable other than glucose, neg for ketones and urine/blood cultures are no growth to date  - Abdominal xray - Will escalate bowel regimen as necessary  Acute encephalopathy and possible seizures CT head was negative, I am unsure whether patient's reported seizure activity at home represents seizure activity, she is not on medication and per husband, will shake, but also scratch her face and pull her hair and then return to her baseline. EEG without epileptiform discharges, suggestive of severe diffuse encephalopathy-EEG monitoring discontinued. HCT was unremarkable yesterday. Pt exceeds weight capacity for MRI so we will defer this study at this time.  -Seen by neurology, appreciate recs.  -Continue Keppra 1000mg  q12 hours  Acute Kidney Injury, hypokalemia, hypocalcemia, hypophosphatemia Patient remains anuric - Continuing CRRT - Pt does not have foley in place, complete daily bladder scans  - Nephrology following, appreciate recs - Continue to renally dose meds   History of HTN -hold home medications  Rheumatoid Arthritis  -Hold Cellcept for now   Best practice:  Diet: Tube Feeds as tolerated Pain/Anxiety/Delirium protocol (if indicated): Fentanyl gtt VAP protocol (if indicated): In place DVT prophylaxis: SCDs GI prophylaxis: Protonix Glucose control: SSI Mobility: bed rest Code Status: full code Family Communication: Husband at the bedside Disposition: ICU  Labs   CBC: Recent Labs  Lab 2020-01-06 2004 06-Jan-2020 2059 12/28/19 0249 12/28/19 0249 12/28/19 2217 12/29/19 0046 01/01/20 0502 01/02/20 0412 01/02/20 0925  WBC 10.7*  --  14.3*  --   --  9.3 8.2 18.5*  --   NEUTROABS 7.9*  --   --   --   --  6.2  --   --   --   HGB 18.9*   < > 17.0*   < > 17.3* 15.9* 12.3 12.7 13.6  HCT 67.2*   < > 56.8*   < > 51.0* 56.4*  42.4 43.2 40.0  MCV 104.0*  --  98.6  --   --  103.5* 101.0* 99.8  --   PLT 250  --  201  --   --  113* 76* 94*  --    < > = values in this interval not displayed.    Basic Metabolic Panel: Recent Labs  Lab 12/31/19 0509 12/31/19 0509 12/31/19 1600 12/31/19 1600 01/01/20 0502 01/01/20 0502 01/01/20 1537 01/02/20 0412 01/02/20 0925 01/02/20 1513 01/03/20 0348  NA 136   < > 135   < > 135   < > 137 137 138 137 135  K 3.2*   < > 3.5   < > 4.5   < > 5.0 4.5 4.2 4.4 5.1  CL 99   < >  98   < > 99  --  102 100  --  100 99  CO2 21*   < > 21*   < > 20*  --  22 20*  --  20* 19*  GLUCOSE 200*   < > 228*   < > 149*  --  173* 167*  --  159* 159*  BUN 24*   < > 23*   < > 23*  --  29* 27*  --  24* 25*  CREATININE 2.62*   < > 2.39*   < > 2.28*  --  2.86* 2.56*  --  2.28* 2.23*  CALCIUM 7.7*   < > 7.9*   < > 8.3*  --  7.5* 8.0*  --  8.1* 8.4*  MG 2.4  --  2.4  --  2.9*  --   --  2.9*  --   --  2.7*  PHOS 1.3*   < > 1.7*   < > 2.3*  --  3.7 3.7  --  3.8 3.9   < > = values in this interval not displayed.   GFR: Estimated Creatinine Clearance: 57.6 mL/min (A) (by C-G formula based on SCr of 2.23 mg/dL (H)). Recent Labs  Lab 12/28/19 0249 12/28/19 0621 12/29/19 0046 12/29/19 0103 12/30/19 0837 12/31/19 0738 01/01/20 0502 01/01/20 0809 01/02/20 0412  WBC 14.3*  --  9.3  --   --   --  8.2  --  18.5*  LATICACIDVEN  --    < >  --  6.9* 2.9* 2.0*  --  3.8*  --    < > = values in this interval not displayed.    Liver Function Tests: Recent Labs  Lab 01/01/2020 2023 01-01-20 2023 12/29/19 0046 12/29/19 1532 12/31/19 0509 12/31/19 1600 01/01/20 0502 01/01/20 1537 01/02/20 0412 01/02/20 1513 01/03/20 0348  AST 76*  --  6,313*  --  473*  --  291*  --  239*  --   --   ALT 101*  --  1,153*  --  353*  --  262*  --  186*  --   --   ALKPHOS 369*  --  158*  --  256*  --  434*  --  596*  --   --   BILITOT 1.9*  --  2.4*  --  11.6*  --  15.1*  --  16.0*  --   --   PROT 8.3*  --  5.4*  --   5.9*  --  6.2*  --  5.7*  --   --   ALBUMIN 4.0   < > 2.6*   < > 3.2*   < > 3.0* 2.4* 2.4* 2.1* 2.0*   < > = values in this interval not displayed.   Recent Labs  Lab 12/28/19 0249  LIPASE 1,652*   Recent Labs  Lab 12/28/19 0934 12/29/19 0636  AMMONIA 77* 91*    ABG    Component Value Date/Time   HCO3 21.5 01/02/2020 0925   TCO2 23 01/02/2020 0925   ACIDBASEDEF 4.0 (H) 01/02/2020 0925   O2SAT 50.0 01/02/2020 0925     Coagulation Profile: No results for input(s): INR, PROTIME in the last 168 hours.  Cardiac Enzymes: No results for input(s): CKTOTAL, CKMB, CKMBINDEX, TROPONINI in the last 168 hours.  HbA1C: Hemoglobin A1C  Date/Time Value Ref Range Status  10/25/2019 08:58 AM 9.7 (A) 4.0 - 5.6 % Final  04/04/2019 11:07 AM 9.8 (A) 4.0 - 5.6 %  Final   Hgb A1c MFr Bld  Date/Time Value Ref Range Status  01-12-20 11:00 PM >15.5 (H) 4.8 - 5.6 % Final    Comment:    (NOTE) **Verified by repeat analysis**         Prediabetes: 5.7 - 6.4         Diabetes: >6.4         Glycemic control for adults with diabetes: <7.0   09/05/2017 06:00 PM 9.6 (H) 4.8 - 5.6 % Final    Comment:    (NOTE) Pre diabetes:          5.7%-6.4% Diabetes:              >6.4% Glycemic control for   <7.0% adults with diabetes     CBG: Recent Labs  Lab 01/03/20 0458 01/03/20 0558 01/03/20 0701 01/03/20 0756 01/03/20 0824  GLUCAP 152* 159* 164* 86 146*    Review of Systems:   Unable to obtain secondary to mental status  Past Medical History  She,  has a past medical history of Anxiety, Asthma, Chronic headache, Chronic lower back pain, Depression, Essential hypertension, Family history of adverse reaction to anesthesia, GERD (gastroesophageal reflux disease), Manic state ([place]), Migraine, Obesity, OSA on CPAP, Osteoarthritis, knee, Pneumonia, Prolonged QT syndrome, PTSD (post-traumatic stress disorder), Rheumatoid arthritis ([place]), Seizures ([place]), Severe needle phobia, and Type II diabetes  mellitus ([place]).   Surgical History    Past Surgical History:  Procedure Laterality Date  . TONSILLECTOMY  1994     Social History   reports that she has never smoked. She has never used smokeless tobacco. She reports previous alcohol use. She reports that she does not use drugs.   Family History   Her family history includes Anxiety disorder in her mother; Depression in her mother; Diabetes in her father and paternal uncle; Heart disease in her paternal grandfather; Hypertension in her maternal grandfather, maternal grandmother, paternal grandfather, and paternal grandmother; Hypothyroidism in her mother; Pancreatic cancer in her maternal grandfather.    Lorne Skeens, Medical Student

## 2020-01-03 NOTE — Progress Notes (Signed)
Inpatient Diabetes Program Recommendations  AACE/ADA: New Consensus Statement on Inpatient Glycemic Control (2015)  Target Ranges:  Prepandial:   less than 140 mg/dL      Peak postprandial:   less than 180 mg/dL (1-2 hours)      Critically ill patients:  140 - 180 mg/dL   Lab Results  Component Value Date   GLUCAP 146 (H) 01/03/2020   HGBA1C >15.5 (H) 12/23/2019    Review of Glycemic Control  Inpatient Diabetes Program Recommendations:   When patient transitions off of IV insulin please overlap IV insulin by 2 hrs. And cover CBG @ time of discontinued IV insulin with Novolog correction. Please consider the following transition: -Levemir 18 units bid -Novolog correction 4-6 units q 4 hrs. -Novolog 6 units tube feed coverage if needed (hold if tube feed held or stopped for any reason)  Thank you, Darel Hong E. Maybree Riling, RN, MSN, CDE  Diabetes Coordinator Inpatient Glycemic Control Team Team Pager [phone] (8am-5pm) 01/03/2020 10:26 AM

## 2020-01-04 ENCOUNTER — Inpatient Hospital Stay (HOSPITAL_COMMUNITY): Payer: Medicaid Other

## 2020-01-04 LAB — GLUCOSE, CAPILLARY
Glucose-Capillary: 161 mg/dL — ABNORMAL HIGH (ref 70–99)
Glucose-Capillary: 167 mg/dL — ABNORMAL HIGH (ref 70–99)
Glucose-Capillary: 167 mg/dL — ABNORMAL HIGH (ref 70–99)
Glucose-Capillary: 171 mg/dL — ABNORMAL HIGH (ref 70–99)
Glucose-Capillary: 180 mg/dL — ABNORMAL HIGH (ref 70–99)
Glucose-Capillary: 207 mg/dL — ABNORMAL HIGH (ref 70–99)

## 2020-01-04 LAB — RENAL FUNCTION PANEL
Albumin: 2 g/dL — ABNORMAL LOW (ref 3.5–5.0)
Albumin: 2.1 g/dL — ABNORMAL LOW (ref 3.5–5.0)
Anion gap: 16 — ABNORMAL HIGH (ref 5–15)
Anion gap: 16 — ABNORMAL HIGH (ref 5–15)
BUN: 27 mg/dL — ABNORMAL HIGH (ref 6–20)
BUN: 28 mg/dL — ABNORMAL HIGH (ref 6–20)
CO2: 18 mmol/L — ABNORMAL LOW (ref 22–32)
CO2: 17 mmol/L — ABNORMAL LOW (ref 22–32)
Calcium: 8.4 mg/dL — ABNORMAL LOW (ref 8.9–10.3)
Calcium: 8.4 mg/dL — ABNORMAL LOW (ref 8.9–10.3)
Chloride: 101 mmol/L (ref 98–111)
Chloride: 102 mmol/L (ref 98–111)
Creatinine, Ser: 2.16 mg/dL — ABNORMAL HIGH (ref 0.44–1.00)
Creatinine, Ser: 2.11 mg/dL — ABNORMAL HIGH (ref 0.44–1.00)
GFR calc Af Amer: 33 mL/min — ABNORMAL LOW (ref >60)
GFR calc Af Amer: 34 mL/min — ABNORMAL LOW (ref >60)
GFR calc non Af Amer: 29 mL/min — ABNORMAL LOW (ref >60)
GFR calc non Af Amer: 30 mL/min — ABNORMAL LOW (ref >60)
Glucose, Bld: 182 mg/dL — ABNORMAL HIGH (ref 70–99)
Glucose, Bld: 182 mg/dL — ABNORMAL HIGH (ref 70–99)
Phosphorus: 5.5 mg/dL — ABNORMAL HIGH (ref 2.5–4.6)
Phosphorus: 5.3 mg/dL — ABNORMAL HIGH (ref 2.5–4.6)
Potassium: 5 mmol/L (ref 3.5–5.1)
Potassium: 5.3 mmol/L — ABNORMAL HIGH (ref 3.5–5.1)
Sodium: 135 mmol/L (ref 135–145)
Sodium: 135 mmol/L (ref 135–145)

## 2020-01-04 LAB — HEPATIC FUNCTION PANEL
ALT: 94 U/L — ABNORMAL HIGH (ref 0–44)
AST: 178 U/L — ABNORMAL HIGH (ref 15–41)
Albumin: 2.2 g/dL — ABNORMAL LOW (ref 3.5–5.0)
Alkaline Phosphatase: 1014 U/L — ABNORMAL HIGH (ref 38–126)
Bilirubin, Direct: 16 mg/dL — ABNORMAL HIGH (ref 0.0–0.2)
Indirect Bilirubin: 5.6 mg/dL — ABNORMAL HIGH (ref 0.3–0.9)
Total Bilirubin: 21.6 mg/dL (ref 0.3–1.2)
Total Protein: 5.7 g/dL — ABNORMAL LOW (ref 6.5–8.1)

## 2020-01-04 LAB — COMPREHENSIVE METABOLIC PANEL
ALT: 113 U/L — ABNORMAL HIGH (ref 0–44)
AST: 186 U/L — ABNORMAL HIGH (ref 15–41)
Albumin: 2 g/dL — ABNORMAL LOW (ref 3.5–5.0)
Alkaline Phosphatase: 986 U/L — ABNORMAL HIGH (ref 38–126)
Anion gap: 17 — ABNORMAL HIGH (ref 5–15)
BUN: 28 mg/dL — ABNORMAL HIGH (ref 6–20)
CO2: 17 mmol/L — ABNORMAL LOW (ref 22–32)
Calcium: 8.3 mg/dL — ABNORMAL LOW (ref 8.9–10.3)
Chloride: 101 mmol/L (ref 98–111)
Creatinine, Ser: 2.17 mg/dL — ABNORMAL HIGH (ref 0.44–1.00)
GFR calc Af Amer: 33 mL/min — ABNORMAL LOW (ref >60)
GFR calc non Af Amer: 29 mL/min — ABNORMAL LOW (ref >60)
Glucose, Bld: 185 mg/dL — ABNORMAL HIGH (ref 70–99)
Potassium: 5 mmol/L (ref 3.5–5.1)
Sodium: 135 mmol/L (ref 135–145)
Total Bilirubin: 21 mg/dL (ref 0.3–1.2)
Total Protein: 5.5 g/dL — ABNORMAL LOW (ref 6.5–8.1)

## 2020-01-04 LAB — CBC WITH DIFFERENTIAL/PLATELET
Abs Immature Granulocytes: 7.59 10*3/uL — ABNORMAL HIGH (ref 0.00–0.07)
Basophils Absolute: 0.1 10*3/uL (ref 0.0–0.1)
Basophils Relative: 0 %
Eosinophils Absolute: 0.2 10*3/uL (ref 0.0–0.5)
Eosinophils Relative: 1 %
HCT: 39.1 % (ref 36.0–46.0)
Hemoglobin: 11.3 g/dL — ABNORMAL LOW (ref 12.0–15.0)
Immature Granulocytes: 22 %
Lymphocytes Relative: 7 %
Lymphs Abs: 2.4 10*3/uL (ref 0.7–4.0)
MCH: 29 pg (ref 26.0–34.0)
MCHC: 28.9 g/dL — ABNORMAL LOW (ref 30.0–36.0)
MCV: 100.3 fL — ABNORMAL HIGH (ref 80.0–100.0)
Monocytes Absolute: 0.8 10*3/uL (ref 0.1–1.0)
Monocytes Relative: 2 %
Neutro Abs: 24.2 10*3/uL — ABNORMAL HIGH (ref 1.7–7.7)
Neutrophils Relative %: 68 %
Platelets: 154 10*3/uL (ref 150–400)
RBC: 3.9 MIL/uL (ref 3.87–5.11)
RDW: 18.7 % — ABNORMAL HIGH (ref 11.5–15.5)
WBC: 35.3 10*3/uL — ABNORMAL HIGH (ref 4.0–10.5)
nRBC: 15.7 % — ABNORMAL HIGH (ref 0.0–0.2)

## 2020-01-04 LAB — RETIC PANEL
Immature Retic Fract: 37.1 % — ABNORMAL HIGH (ref 2.3–15.9)
RBC.: 3.58 MIL/uL — ABNORMAL LOW (ref 3.87–5.11)
Retic Count, Absolute: 104.2 10*3/uL (ref 19.0–186.0)
Retic Ct Pct: 2.9 % (ref 0.4–3.1)
Reticulocyte Hemoglobin: 32.4 pg (ref >27.9)

## 2020-01-04 LAB — CBC
HCT: 38.7 % (ref 36.0–46.0)
Hemoglobin: 11.4 g/dL — ABNORMAL LOW (ref 12.0–15.0)
MCH: 29.5 pg (ref 26.0–34.0)
MCHC: 29.5 g/dL — ABNORMAL LOW (ref 30.0–36.0)
MCV: 100 fL (ref 80.0–100.0)
Platelets: 145 10*3/uL — ABNORMAL LOW (ref 150–400)
RBC: 3.87 MIL/uL (ref 3.87–5.11)
RDW: 18.5 % — ABNORMAL HIGH (ref 11.5–15.5)
WBC: 36.1 10*3/uL — ABNORMAL HIGH (ref 4.0–10.5)
nRBC: 14.7 % — ABNORMAL HIGH (ref 0.0–0.2)

## 2020-01-04 LAB — LACTIC ACID, PLASMA: Lactic Acid, Venous: 3.6 mmol/L (ref 0.5–1.9)

## 2020-01-04 LAB — LACTATE DEHYDROGENASE: LDH: 908 U/L — ABNORMAL HIGH (ref 98–192)

## 2020-01-04 LAB — LIPASE, BLOOD: Lipase: 76 U/L — ABNORMAL HIGH (ref 11–51)

## 2020-01-04 LAB — MAGNESIUM: Magnesium: 2.7 mg/dL — ABNORMAL HIGH (ref 1.7–2.4)

## 2020-01-04 LAB — AMMONIA: Ammonia: 66 umol/L — ABNORMAL HIGH (ref 9–35)

## 2020-01-04 MED ORDER — EPINEPHRINE HCL 5 MG/250ML IV SOLN IN NS
0.5000 ug/min | INTRAVENOUS | Status: DC
  Administered 2020-01-04: 20 ug/min via INTRAVENOUS
  Administered 2020-01-05: 19 ug/min via INTRAVENOUS
  Filled 2020-01-04 (×2): qty 250

## 2020-01-04 MED ORDER — ALBUMIN HUMAN 5 % IV SOLN: 12.5000 g | Freq: Once | INTRAVENOUS | Status: AC

## 2020-01-04 MED ORDER — HYDROCORTISONE NA SUCCINATE PF 100 MG IJ SOLR
50.0000 mg | Freq: Four times a day (QID) | INTRAMUSCULAR | Status: DC
  Administered 2020-01-04 – 2020-01-08 (×15): 50 mg via INTRAVENOUS
  Filled 2020-01-04 (×15): qty 2

## 2020-01-04 MED ORDER — BELLADONNA ALKALOIDS-OPIUM 16.2-60 MG RE SUPP
0.5000 | Freq: Four times a day (QID) | RECTAL | Status: DC | PRN
  Filled 2020-01-04: qty 1

## 2020-01-04 MED ORDER — PIPERACILLIN-TAZOBACTAM 3.375 G IVPB 30 MIN
3.3750 g | Freq: Four times a day (QID) | INTRAVENOUS | Status: DC
  Administered 2020-01-04 (×3): 3.375 g via INTRAVENOUS
  Filled 2020-01-04 (×5): qty 50

## 2020-01-04 MED ORDER — BISACODYL 10 MG RE SUPP
10.0000 mg | Freq: Every day | RECTAL | Status: DC | PRN
  Administered 2020-01-04: 10 mg via RECTAL
  Filled 2020-01-04: qty 1

## 2020-01-04 MED ORDER — PNEUMOCOCCAL VAC POLYVALENT 25 MCG/0.5ML IJ INJ
0.5000 mL | INJECTION | INTRAMUSCULAR | Status: DC | PRN
  Filled 2020-01-04: qty 0.5

## 2020-01-04 MED ORDER — SODIUM CHLORIDE 0.9 % IV SOLN
1.0000 g | Freq: Three times a day (TID) | INTRAVENOUS | Status: DC
  Administered 2020-01-04 – 2020-01-08 (×11): 1 g via INTRAVENOUS
  Filled 2020-01-04 (×14): qty 1

## 2020-01-04 MED ORDER — SORBITOL 70 % SOLN
960.0000 mL | TOPICAL_OIL | Freq: Every day | ORAL | Status: DC | PRN
  Filled 2020-01-04: qty 473

## 2020-01-04 MED ORDER — SODIUM CHLORIDE 0.9 % IV BOLUS
500.0000 mL | Freq: Once | INTRAVENOUS | Status: AC
  Administered 2020-01-04: 500 mL via INTRAVENOUS

## 2020-01-04 MED ORDER — BELLADONNA ALKALOIDS-OPIUM 16.2-30 MG RE SUPP: 1.0000 | Freq: Four times a day (QID) | RECTAL | Status: DC | PRN

## 2020-01-04 MED ORDER — ALBUMIN HUMAN 5 % IV SOLN
INTRAVENOUS | Status: AC
  Administered 2020-01-04: 12.5 g via INTRAVENOUS
  Filled 2020-01-04: qty 250

## 2020-01-04 MED ORDER — EPINEPHRINE HCL 5 MG/250ML IV SOLN IN NS
INTRAVENOUS | Status: AC
  Administered 2020-01-04: 1 ug/min via INTRAVENOUS
  Filled 2020-01-04: qty 250

## 2020-01-04 MED ORDER — SODIUM CHLORIDE 0.9 % IV SOLN: 500.0000 mg | Freq: Three times a day (TID) | INTRAVENOUS | Status: DC

## 2020-01-04 NOTE — Progress Notes (Signed)
Patient transported to CT and returned to 32m14 without complications. Vital signs stable at this time. RT will continue to monitor.

## 2020-01-04 NOTE — Progress Notes (Signed)
eLink Physician-Brief Progress Note Patient Name: Courtney Zamora DOB: 11-19-1985 MRN: [id number]   Date of Service  01/04/2020  HPI/Events of Note  Notified of hypotension on epi, levo and vaso. Ongoing CRRT. Fentanyl turned off. On chart review, patient cummulative fluid balance positive since admission but has been relatively negative the past 3-4 days. Today WBC 35, abdominal CT with worsening pancreatitis,  piperacilin-tazobactam resumed today.   eICU Interventions   Switch antibiotics to meropenem, dose adjustment per pharmacy while on CRRT  Will give a trial of fluid bolus as likely third spacing and intravascularly deplete.  Albumin given may give more     Intervention Category Major Interventions: Shock - evaluation and management  Darl Pikes 01/04/2020, 7:49 PM

## 2020-01-04 NOTE — Progress Notes (Signed)
[facility] ROUNDING NOTE   Subjective:   [name] diabetes rheumatoid arthritis chronic CellCept possible seizure disorder presented 12/25/2019 with altered mental status seizures and diabetic ketoacidosis.  She developed acute kidney injury  and anuric renal failure.  Lactic acid level was 10.8.  Her baseline creatinine appears to be in the normal range.  The etiology of her renal failure appears to be secondary to ischemic acute tubular necrosis in setting of septic shock and diabetes ketoacidosis.  Renal ultrasound showed normal right and left kidney with an unremarkable CT scan.  CRRT was initiated 12/29/2019.  She is being kept even on CRRT  Blood pressure 113/56 pulse 97 temperature 99 O2 sats 97% FiO2 40%  Sodium 135 potassium 5.0 chloride 101 CO2 18 BUN 27 creatinine 2.16 glucose 185 calcium 8.4 phosphorus 5.5 magnesium 2.7 albumin 2.0.  WBC 36.1 platelets 145 hemoglobin 11.4  Keppra 1 g twice daily, Protonix 40 mg daily  Protonix 40 mg daily IV fentanyl IV insulin IV norepinephrine and IV vasopressin IV Keppra   CT scan performed with IV contrast 01/01/2020 due to medical necessity.  No evidence of pulmonary embolus bilateral lower lobe airspace disease consistent with bilateral pneumonia endotracheal tube in place hepatic steatosis    Objective:  Vital signs in last 24 hours:  Temp:  [97.6 F (36.4 C)-101.1 F (38.4 C)] 99.1 F (37.3 C) (06/19 0700) Pulse Rate:  [90-104] 95 (06/19 0700) Resp:  [23-44] 33 (06/19 0700) BP: (86-116)/(48-74) 109/60 (06/19 0700) SpO2:  [94 %-100 %] 98 % (06/19 0700) Arterial Line BP: (87-126)/(45-59) 99/51 (06/19 0600) FiO2 (%):  [40 %] 40 % (06/19 0400) Weight:  [176.7 kg] 176.7 kg (06/19 0326)  Weight change: -0.3 kg Filed Weights   01/03/20 0404 01/03/20 2300 01/04/20 0326  Weight: (!) 177 kg (!) 176.7 kg (!) 176.7 kg    Intake/Output: I/O last 3 completed shifts: In: 2133.2 [I.V.:1663.2; NG/GT:310;  IV Piggyback:160] Out: 3665 [Emesis/NG output:1350; Other:2315]   Intake/Output this shift:  No intake/output data recorded.     General exam: Ill-looking female sedated and intubated Respiratory system: Coarse mechanical breath sound bilateral Cardiovascular system: S1 & S2 heard, RRR.  Edema present in all extremities. Gastrointestinal system: Abdomen is distended, soft. Central nervous system: Sedated and not responding Extremities: Edematous, no cyanosis or clubbing Skin: No rashes, lesions or ulcers Psychiatry: Unable to assess as patient is sedated.   Basic Metabolic Panel: Recent Labs  Lab 12/31/19 1600 12/31/19 1600 01/01/20 0502 01/01/20 1537 01/02/20 0412 01/02/20 0412 01/02/20 0925 01/02/20 1513 01/02/20 1513 01/03/20 0348 01/03/20 1604 01/04/20 0309  NA 135   < > 135   < > 137   < > 138 137  --  135 136 135  135  K 3.5   < > 4.5   < > 4.5   < > 4.2 4.4  --  5.1 5.4* 5.0  5.0  CL 98   < > 99   < > 100  --   --  100  --  99 100 101  101  CO2 21*   < > 20*   < > 20*  --   --  20*  --  19* 18* 17*  18*  GLUCOSE 228*   < > 149*   < > 167*  --   --  159*  --  159* 198* 185*  182*  BUN 23*   < > 23*   < > 27*  --   --  24*  --  25* 27* 28*  27*  CREATININE 2.39*   < > 2.28*   < > 2.56*  --   --  2.28*  --  2.23* 2.26* 2.17*  2.16*  CALCIUM 7.9*   < > 8.3*   < > 8.0*   < >  --  8.1*   < > 8.4* 8.3* 8.3*  8.4*  MG 2.4  --  2.9*  --  2.9*  --   --   --   --  2.7*  --  2.7*  PHOS 1.7*   < > 2.3*   < > 3.7  --   --  3.8  --  3.9 4.9* 5.5*   < > = values in this interval not displayed.    Liver Function Tests: Recent Labs  Lab 12/29/19 0046 12/29/19 1532 12/31/19 0509 12/31/19 1600 01/01/20 0502 01/01/20 1537 01/02/20 0412 01/02/20 1513 01/03/20 0348 01/03/20 1604 01/04/20 0309  AST 6,313*  --  473*  --  291*  --  239*  --   --   --  186*  ALT 1,153*  --  353*  --  262*  --  186*  --   --   --  113*  ALKPHOS 158*  --  256*  --  434*  --  596*  --    --   --  986*  BILITOT 2.4*  --  11.6*  --  15.1*  --  16.0*  --   --   --  21.0*  PROT 5.4*  --  5.9*  --  6.2*  --  5.7*  --   --   --  5.5*  ALBUMIN 2.6*   < > 3.2*   < > 3.0*   < > 2.4* 2.1* 2.0* 2.2* 2.0*  2.0*   < > = values in this interval not displayed.   No results for input(s): LIPASE, AMYLASE in the last 168 hours. Recent Labs  Lab 12/28/19 0934 12/29/19 0636 01/04/20 0605  AMMONIA 77* 91* 66*    CBC: Recent Labs  Lab 12/29/19 0046 01/01/20 0502 01/02/20 0412 01/02/20 0925 01/04/20 0309  WBC 9.3 8.2 18.5*  --  36.1*  NEUTROABS 6.2  --   --   --   --   HGB 15.9* 12.3 12.7 13.6 11.4*  HCT 56.4* 42.4 43.2 40.0 38.7  MCV 103.5* 101.0* 99.8  --  100.0  PLT 113* 76* 94*  --  145*    Cardiac Enzymes: No results for input(s): CKTOTAL, CKMB, CKMBINDEX, TROPONINI in the last 168 hours.  BNP: Invalid input(s): POCBNP  CBG: Recent Labs  Lab 01/03/20 1127 01/03/20 1602 01/03/20 1924 01/03/20 2334 01/04/20 0309  GLUCAP 208* 194* 189* 172* 180*    Microbiology: Results for orders placed or performed during the hospital encounter of 01/05/2020  SARS Coronavirus 2 by RT PCR (hospital order, performed in [facility] hospital lab) Nasopharyngeal Nasopharyngeal Swab     Status: None   Collection Time: 01/03/2020  9:38 PM   Specimen: Nasopharyngeal Swab  Result Value Ref Range Status   SARS Coronavirus 2 NEGATIVE NEGATIVE Final    Comment: (NOTE) SARS-CoV-2 target nucleic acids are NOT DETECTED.  The SARS-CoV-2 RNA is generally detectable in upper and lower respiratory specimens during the acute phase of infection. The lowest concentration of SARS-CoV-2 viral copies this assay can detect is 250 copies / mL. A negative result does not preclude SARS-CoV-2 infection and should not be used  as the sole basis for treatment or other patient management decisions.  A negative result may occur with improper specimen collection / handling, submission of specimen other than  nasopharyngeal swab, presence of viral mutation(s) within the areas targeted by this assay, and inadequate number of viral copies (<250 copies / mL). A negative result must be combined with clinical observations, patient history, and epidemiological information.  Fact Sheet for Patients:   StrictlyIdeas.no  Fact Sheet for Healthcare Providers: BankingDealers.co.za  This test is not yet approved or  cleared by the [place] FDA and has been authorized for detection and/or diagnosis of SARS-CoV-2 by FDA under an Emergency Use Authorization (EUA).  This EUA will remain in effect (meaning this test can be used) for the duration of the COVID-19 declaration under Section 564(b)(1) of the Act, 21 U.S.C. section 360bbb-3(b)(1), unless the authorization is terminated or revoked sooner.  Performed at [facility], [place] [address] [place], [place] [address]   MRSA PCR Screening     Status: None   Collection Time: 12/28/19  1:07 AM   Specimen: Nasal Mucosa; Nasopharyngeal  Result Value Ref Range Status   MRSA by PCR NEGATIVE NEGATIVE Final    Comment:        The GeneXpert MRSA Assay (FDA approved for NASAL specimens only), is one component of a comprehensive MRSA colonization surveillance program. It is not intended to diagnose MRSA infection nor to guide or monitor treatment for MRSA infections. Performed at [facility], [place] [address] [place], [place] [address]   Culture, Urine     Status: None   Collection Time: 12/28/19  1:15 AM   Specimen: Urine, Catheterized  Result Value Ref Range Status   Specimen Description URINE, CATHETERIZED  Final   Special Requests NONE  Final   Culture   Final    NO GROWTH Performed at [place] [address], [place], [place] [address]    Report Status 12/29/2019 FINAL  Final  Culture, blood (routine x 2)     Status: None   Collection Time: 12/28/19  2:49 AM   Specimen:  BLOOD  Result Value Ref Range Status   Specimen Description BLOOD LEFT ANTECUBITAL  Final   Special Requests   Final    BOTTLES DRAWN AEROBIC ONLY Blood Culture adequate volume   Culture   Final    NO GROWTH 5 DAYS Performed at [facility], [place] [address], [place], [place] [address]    Report Status 01/02/2020 FINAL  Final  Culture, blood (routine x 2)     Status: None   Collection Time: 12/28/19  2:49 AM   Specimen: BLOOD RIGHT HAND  Result Value Ref Range Status   Specimen Description BLOOD RIGHT HAND  Final   Special Requests   Final    BOTTLES DRAWN AEROBIC ONLY Blood Culture adequate volume   Culture   Final    NO GROWTH 5 DAYS Performed at [facility], [place] [address], [place], [place] [address]    Report Status 01/02/2020 FINAL  Final  Culture, blood (routine x 2)     Status: None (Preliminary result)   Collection Time: 01/02/20  7:26 AM   Specimen: BLOOD RIGHT HAND  Result Value Ref Range Status   Specimen Description BLOOD RIGHT HAND  Final   Special Requests   Final    BOTTLES DRAWN AEROBIC ONLY Blood Culture adequate volume   Culture   Final  NO GROWTH < 24 HOURS Performed at [place] [address] [place], [place] [address]    Report Status PENDING  Incomplete  Culture, blood (routine x 2)     Status: None (Preliminary result)   Collection Time: 01/02/20  7:26 AM   Specimen: BLOOD RIGHT HAND  Result Value Ref Range Status   Specimen Description BLOOD RIGHT HAND  Final   Special Requests   Final    BOTTLES DRAWN AEROBIC ONLY Blood Culture adequate volume   Culture   Final    NO GROWTH < 24 HOURS Performed at [facility], [place] [address], [place], [place] [address]    Report Status PENDING  Incomplete    Coagulation Studies: No results for input(s): LABPROT, INR in the last 72 hours.  Urinalysis: No results for input(s): COLORURINE, LABSPEC, PHURINE, GLUCOSEU, HGBUR, BILIRUBINUR, KETONESUR, PROTEINUR, UROBILINOGEN,  NITRITE, LEUKOCYTESUR in the last 72 hours.  Invalid input(s): APPERANCEUR    Imaging: DG Chest Port 1 View  Result Date: 01/04/2020 CLINICAL DATA:  Initial evaluation for acute respiratory failure. EXAM: PORTABLE CHEST 1 VIEW COMPARISON:  Prior radiograph from 12/31/2019. FINDINGS: Endotracheal tube in place with tip positioned at 2 cm above the carina. Right IJ approach central venous catheter remains in place with tip overlying the distal SVC. Additional left-sided central venous catheter terminates near the cavoatrial junction. Enteric tube courses into the abdomen, tip and side hole not visualized. Stable heart size. Mediastinal silhouette also unchanged, and remains within normal limits. Lungs are hypoinflated. Diffuse pulmonary vascular congestion with bibasilar subsegmental atelectasis. No new consolidative airspace disease. No visible pleural effusion. No pneumothorax. Visualized soft tissues and osseous structures are unchanged. IMPRESSION: 1. Tip of the endotracheal tube 2 cm above the carina. Remaining support apparatus in satisfactory position. 2. Shallow lung inflation with diffuse pulmonary vascular congestion and bibasilar subsegmental atelectasis. Electronically Signed   By: Jeannine Boga M.D.   On: 01/04/2020 06:37   DG Abd Portable 1V  Result Date: 01/03/2020 CLINICAL DATA:  Constipation EXAM: PORTABLE ABDOMEN - 1 VIEW COMPARISON:  None. FINDINGS: The study is very limited due to patient body habitus and poor penetration. No evidence of bowel obstruction. No free air, portal venous gas, or pneumatosis. A feeding tube terminates in the stomach. There appears to be some fecal loading in the distal sigmoid colon and rectum. IMPRESSION: There appears to be some fecal loading in the distal sigmoid colon and rectum. No other abnormalities identified. Limited study due to patient body habitus and poor penetration. Electronically Signed   By: Dorise Bullion III M.D   On: 01/03/2020  12:19   US Abdomen Limited RUQ  Result Date: 01/04/2020 CLINICAL DATA:  Initial evaluation for hepatic biliary anomaly. EXAM: ULTRASOUND ABDOMEN LIMITED RIGHT UPPER QUADRANT COMPARISON:  None. FINDINGS: Gallbladder: Gallbladder partially distended without internal stones or sludge. Gallbladder wall measures at the upper limits of normal at 3-4 mm, likely related to partial distention. No free pericholecystic fluid. No sonographic Murphy sign elicited on exam. Common bile duct: Diameter: 2.5 mm Liver: No focal lesion identified. Dense echogenic echotexture seen throughout the liver. Portal vein is patent on color Doppler imaging with normal direction of blood flow towards the liver. Other: None. IMPRESSION: 1. Normal sonographic evaluation of the gallbladder. No cholelithiasis, evidence for acute cholecystitis, or biliary dilatation. 2. Increased echogenicity throughout the hepatic parenchyma, which can be seen with steatosis or other intrinsic hepatocellular disease. Electronically Signed   By: Pincus Badder.D.  On: 01/04/2020 07:13     Medications:   .  prismasol BGK 4/2.5 500 mL/hr at 01/03/20 2134  .  prismasol BGK 4/2.5 300 mL/hr at 01/03/20 2134  . sodium chloride Stopped (12/28/19 1117)  . sodium chloride Stopped (12/29/19 0755)  . feeding supplement (VITAL HIGH PROTEIN) Stopped (01/02/20 2302)  . fentaNYL infusion INTRAVENOUS 75 mcg/hr (01/04/20 0700)  . heparin Stopped (01/02/20 2100)  . insulin Stopped (01/03/20 1023)  . norepinephrine (LEVOPHED) Adult infusion 31 mcg/min (01/04/20 0700)  . piperacillin-tazobactam    . prismasol BGK 4/2.5 1,500 mL/hr at 01/04/20 0420  . sodium chloride    . vasopressin (PITRESSIN) infusion - *FOR SHOCK* 0.04 Units/min (01/04/20 0700)   . B-complex with vitamin C  1 tablet Per Tube Daily  . chlorhexidine gluconate (MEDLINE KIT)  15 mL Mouth Rinse BID  . Chlorhexidine Gluconate Cloth  6 each Topical Q0600  . docusate  100 mg Per Tube BID   . feeding supplement (PRO-STAT SUGAR FREE 64)  30 mL Per Tube Daily  . heparin  5,000 Units Subcutaneous Q8H  . insulin aspart  3-9 Units Subcutaneous Q4H  . insulin detemir  15 Units Subcutaneous Q12H  . levETIRAcetam  1,000 mg Per Tube BID  . mouth rinse  15 mL Mouth Rinse 10 times per day  . pantoprazole sodium  40 mg Per Tube Daily  . polyethylene glycol  17 g Per Tube Daily  . sodium chloride flush  10-40 mL Intracatheter Q12H   sodium chloride, dextrose, fentaNYL, heparin, heparin, midazolam, [START ON 01/05/2020] pneumococcal 23 valent vaccine, sodium chloride flush  Assessment/ Plan:   Acute kidney injury secondary ischemic ATN in setting of septic shock diabetic ketoacidosis anuric.  CT scan and ultrasound showed no evidence of hydronephrosis CRRT initiated 12/29/2019.  We will continue with no change  Hypotension/shock continues on vasopressin and Levophed.  Kept even on CRRT.  Ventilator dependent respiratory failure  Lactic acidosis secondary to septic shock  Diabetes mellitus with ketoacidosis.  IV insulin  Encephalopathy generalized continuous slowing and triphasic waves.  Repeat CT scan of head unremarkable 01/02/2020.  Appreciate assistance of Dr. Rory Percy.  Patient unable to receive MRI due to body habitus continues on Keppra appreciate assistance from neurology  Septic shock.  Antibiotics discontinued 01/03/2020  Hypokalemia improved.    Hypophosphatemia improved    LOS: [place] '@TODAY''@7'$ :20 AM

## 2020-01-04 NOTE — Progress Notes (Signed)
NAME:  SENAIDA CHILCOTE, MRN:  [id number], DOB:  1985-12-09, LOS: 8 ADMISSION DATE:  Jan 07, 2020, CONSULTATION DATE:  01/04/20 REFERRING MD:  EDP, CHIEF COMPLAINT:  seizures   Brief History   34 y.o. F with PMH of RA, Type 2 DM, possible seizures, HTN who presented with shaking of abnormal jerking movements thought to be seizures, AMS and hyperglycemia.  Pt loaded with Keppra, head CT negative, also developed fever and had a significant lactic acidosis.  Given AMS, PCCM consulted for admission.  History of present illness   Miesha Travieso is a  RA on cellcept, Type 2 DM, possible seizures (no prior EEG and no medications per husband), HTN who presented with shaking of abnormal jerking movements thought to be seizures, AMS and hyperglycemia.   He reports that about two days ago she was having trouble holding a cup because of weakness and was only eating fruit because everything tasted metallic.  He states her blood sugar was high, but it usually is.  She had some constipation, but did not complain of abdominal pain, diarrhea, nausea, vomiting or fever.  Earlier in the day, he noted her L arm shaking, he thought she was having a seizure, though this was different than her usual seizure.  These he describes as being diagnosed by a PCP and not on any medication.  She was sometimes scratch her face and pull her hair and then be fine immediately afterwards, she does not seek medical care after these episodes.  In the ED, pt was having continued twitching/jerking of the L side and was loaded with Keppra. CT head negative.  ED course significant for worsening mental status, oxygen requirement, lactic acid 10.8, K 2.4, Glucose >1100, anion gap 25, Co2 34.  Pt was able to intermittently answer questions and c/o abdominal pain.   She was given K 108meq and 3L IVF, she then spiked a fever to 101F.  Pt seen by neurology and EEG did not show evidence of seizures.   PCCM consulted for admission  Past Medical History    has a past medical history of Anxiety, Asthma, Chronic headache, Chronic lower back pain, Depression, Essential hypertension, Family history of adverse reaction to anesthesia, GERD (gastroesophageal reflux disease), Manic state ([place]), Migraine, Obesity, OSA on CPAP, Osteoarthritis, knee, Pneumonia, Prolonged QT syndrome, PTSD (post-traumatic stress disorder), Rheumatoid arthritis ([place]), Seizures ([place]), Severe needle phobia, and Type II diabetes mellitus ([place]).   Significant Hospital Events   6/11 Admit to [facility] 6/13 still profoundly sick, septic requiring 3 pressors   Consults:  neurology Renal: CRRT  Procedures:  6/12 endotracheal intubation 6/12 central line placement in the right IJ 6/13 left IJ central line placement 6/13 right IJ hemodialysis catheter 6/18 left foot arterial line placement  Significant Diagnostic Tests:  6/11 CT head>> limited due to patient motion, no acute findings 6/11 CT abd/pelvis>> There is some mild fat stranding about the pancreas, which may represent acute pancreatitis. There is some mild wall thickening of the duodenum, which may be reactive or secondary to duodenitis. 6/12 chest x-ray following IJ and endotracheal tube-line in good placement, ET tube adequately placed OG tube adequately placed 6/19-CT abdomen pelvis shows no biliary obstruction.  Worsening pancreatitis.  Significant rectal stool burden.  Micro Data:  6/12 BCx2 >> no growth x4 days  6/12 UC>> NG 6/16 BCx2 >> NG@ 24h Antimicrobials:  Zosyn 6/12-6/18 Vancomycin 6/12- 6/17  Interim history/subjective:   Remains in vasopressor dependent shock.  Minimally responsive.  More jaundiced.  Objective  Blood pressure (!) 85/66, pulse (!) 103, temperature (!) 100.5 F (38.1 C), temperature source Axillary, resp. rate (!) 42, height 5\' 4"  (1.626 m), weight (!) 176.7 kg, SpO2 98 %. CVP:  [4 mmHg-14 mmHg] 4 mmHg  Vent Mode: PRVC FiO2 (%):  [40 %] 40 % Set Rate:  [16 bmp] 16 bmp Vt Set:   [440 mL] 440 mL PEEP:  [8 cmH20] 8 cmH20 Plateau Pressure:  [14 cmH20-25 cmH20] 25 cmH20   Intake/Output Summary (Last 24 hours) at 01/04/2020 1802 Last data filed at 01/04/2020 1700 Gross per 24 hour  Intake 1829.77 ml  Output 2183 ml  Net -353.23 ml   Filed Weights   01/03/20 0404 01/03/20 2300 01/04/20 0326  Weight: (!) 177 kg (!) 176.7 kg (!) 176.7 kg    General:  Obese F, comatose on vent HEENT: MM pink/moist, scleral icterus present, endotracheal tube in place Neuro: Does not follow commands in any extremities, does withdrawal from pain CV: S1-S2 appreciated PULM: Coarse breath sounds bilaterally  GI: soft, obese, bsx4 active   Resolved Hospital Problem list     Assessment & Plan:  34 y.o. F who presented with possible seizures and AMS,  found to have hyperglycemia, possible sepsis and pancreatitis   DKA/HHS Initial glucose >1100 with gap of 25, pH 7.4. Blood sugar is normalizing now on endotool. Bicarb stable at 20 this morning. Suspect intravascular volume depletion given presentation with suspected DKA. We will continue fluid replacement and minimize volume removal with CRRT. The patient lost peripheral IV access at the site she was receiving insulin gtt, give reduced insulin requirements with endo tool, we will switch her to SSI and subq insulin at this time.  -SSI, sq insulin -Continue to follow aggressively with q2 CBG checks  Acute pancreatitis.  Possibly due to microlithiasis given jaundice and presence of gallbladder sludge.  Also may be secondary to hyperglycemia.  No biliary obstruction at this time. -Supportive care will explain why patient is volume seeking. -No indication for biliary intervention. -We will place a feeding tube post ligament of Treitz.  Thrombocytopenia - Continuing to hold heparin Could also be sequelae of her severe sepsis or could be drug-induced due to zosyn.  - Continue to monitor with daily CBCs - Maintain SCDs   Septic Shock -  Arterial line placed on 6/18. Given multiple negative blood cultures and negative urine culture, we will discontinue antibiotic today. We will attempt intravascular resuscitation with 5% albumin.  -follow lactic acid-trending down - Stop antibiotics - New Blood cx drawn 6/16 given fever to 102 and WBC to 18.5 still NG  -Patient continues to require norepinephrine and vasopressin today. Still no clear source of infection   Acute Hypoxic Respiratory Failure Possibly due to AMS, CXR with vascular congestion and cardiomegaly.  Last Echo in 2019 with preserved EF. RR downtrending to the 30s today. Chest CT yesterday negative for PE, showed possible bibasilar PNA vs atelectasis. Will continue to monitor respiratory status and wean from vent as tolerated. VBG yesterday did not show signs of hypercarbia. The patient continues to be sedated this morning, not able to follow commands. We have placed an arterial line for more accurate pressure monitoring  - Continuous MAP monitoring - Keep head of bed elevated to at least 45 degrees - minimize fluid removal on CRRT; patient has a narrow pulse pressure and is intravascularly dry -Repeat echocardiogram performed 12/29/2019-poor window, LV EF ~55% w/out regional wall motion abnormalities; LV cavity normal in size, no LVH. RV  not visualized. IVC w/ >50% respiratory variability.  -Continue mechanical ventilation to maintain O2 sat >93% -Wean as tolerated   Acute encephalopathy and possible seizures CT head was negative, I am unsure whether patient's reported seizure activity at home represents seizure activity, she is not on medication and per husband, will shake, but also scratch her face and pull her hair and then return to her baseline. EEG without epileptiform discharges, suggestive of severe diffuse encephalopathy-EEG monitoring discontinued. HCT was unremarkable yesterday. Pt exceeds weight capacity for MRI so we will defer this study at this time.  -Seen by  neurology, appreciate recs.  -Continue Keppra 1000mg  q12 hours  Acute Kidney Injury, hypokalemia, hypocalcemia, hypophosphatemia Patient remains anuric - Continuing CRRT - Pt does not have foley in place, complete daily bladder scans  - Nephrology following, appreciate recs - Continue to renally dose meds   History of HTN -hold home medications  Rheumatoid Arthritis  -Hold Cellcept for now   Best practice:  Diet: Tube Feeds as tolerated Pain/Anxiety/Delirium protocol (if indicated): Fentanyl gtt VAP protocol (if indicated): In place DVT prophylaxis: SCDs GI prophylaxis: Protonix Glucose control: SSI Mobility: bed rest Code Status: full code Family Communication: Husband at the bedside Disposition: ICU  Labs   CBC: Recent Labs  Lab 12/29/19 0046 12/29/19 0046 01/01/20 0502 01/02/20 0412 01/02/20 0925 01/04/20 0309 01/04/20 0659  WBC 9.3  --  8.2 18.5*  --  36.1* 35.3*  NEUTROABS 6.2  --   --   --   --   --  24.2*  HGB 15.9*   < > 12.3 12.7 13.6 11.4* 11.3*  HCT 56.4*   < > 42.4 43.2 40.0 38.7 39.1  MCV 103.5*  --  101.0* 99.8  --  100.0 100.3*  PLT 113*  --  76* 94*  --  145* 154   < > = values in this interval not displayed.    Basic Metabolic Panel: Recent Labs  Lab 12/31/19 1600 12/31/19 1600 01/01/20 0502 01/01/20 1537 01/02/20 0412 01/02/20 0925 01/02/20 1513 01/03/20 0348 01/03/20 1604 01/04/20 0309 01/04/20 1553  NA 135   < > 135   < > 137   < > 137 135 136 135  135 135  K 3.5   < > 4.5   < > 4.5   < > 4.4 5.1 5.4* 5.0  5.0 5.3*  CL 98   < > 99   < > 100  --  100 99 100 101  101 102  CO2 21*   < > 20*   < > 20*  --  20* 19* 18* 17*  18* 17*  GLUCOSE 228*   < > 149*   < > 167*  --  159* 159* 198* 185*  182* 182*  BUN 23*   < > 23*   < > 27*  --  24* 25* 27* 28*  27* 28*  CREATININE 2.39*   < > 2.28*   < > 2.56*  --  2.28* 2.23* 2.26* 2.17*  2.16* 2.11*  CALCIUM 7.9*   < > 8.3*   < > 8.0*  --  8.1* 8.4* 8.3* 8.3*  8.4* 8.4*  MG 2.4   --  2.9*  --  2.9*  --   --  2.7*  --  2.7*  --   PHOS 1.7*   < > 2.3*   < > 3.7  --  3.8 3.9 4.9* 5.5* 5.3*   < > = values in this interval  not displayed.   GFR: Estimated Creatinine Clearance: 60.8 mL/min (A) (by C-G formula based on SCr of 2.11 mg/dL (H)). Recent Labs  Lab 12/29/19 0046 12/29/19 0103 12/30/19 1610 12/31/19 0738 01/01/20 0502 01/01/20 0809 01/02/20 0412 01/04/20 0309 01/04/20 0659  WBC   < >  --   --   --  8.2  --  18.5* 36.1* 35.3*  LATICACIDVEN  --  6.9* 2.9* 2.0*  --  3.8*  --   --   --    < > = values in this interval not displayed.    Liver Function Tests: Recent Labs  Lab 12/29/19 0046 12/29/19 1532 12/31/19 0509 12/31/19 1600 01/01/20 0502 01/01/20 1537 01/02/20 0412 01/02/20 0412 01/02/20 1513 01/03/20 0348 01/03/20 1604 01/04/20 0309 01/04/20 1553  AST 6,313*  --  473*  --  291*  --  239*  --   --   --   --  186*  --   ALT 1,153*  --  353*  --  262*  --  186*  --   --   --   --  113*  --   ALKPHOS 158*  --  256*  --  434*  --  596*  --   --   --   --  986*  --   BILITOT 2.4*  --  11.6*  --  15.1*  --  16.0*  --   --   --   --  21.0*  --   PROT 5.4*  --  5.9*  --  6.2*  --  5.7*  --   --   --   --  5.5*  --   ALBUMIN 2.6*   < > 3.2*   < > 3.0*   < > 2.4*   < > 2.1* 2.0* 2.2* 2.0*  2.0* 2.1*   < > = values in this interval not displayed.   Recent Labs  Lab 01/04/20 1553  LIPASE 76*   Recent Labs  Lab 12/29/19 0636 01/04/20 0605  AMMONIA 91* 66*    ABG    Component Value Date/Time   HCO3 21.5 01/02/2020 0925   TCO2 23 01/02/2020 0925   ACIDBASEDEF 4.0 (H) 01/02/2020 0925   O2SAT 50.0 01/02/2020 0925     Coagulation Profile: No results for input(s): INR, PROTIME in the last 168 hours.  Cardiac Enzymes: No results for input(s): CKTOTAL, CKMB, CKMBINDEX, TROPONINI in the last 168 hours.  HbA1C: Hemoglobin A1C  Date/Time Value Ref Range Status  10/25/2019 08:58 AM 9.7 (A) 4.0 - 5.6 % Final  04/04/2019 11:07 AM 9.8 (A)  4.0 - 5.6 % Final   Hgb A1c MFr Bld  Date/Time Value Ref Range Status  12/25/2019 11:00 PM >15.5 (H) 4.8 - 5.6 % Final    Comment:    (NOTE) **Verified by repeat analysis**         Prediabetes: 5.7 - 6.4         Diabetes: >6.4         Glycemic control for adults with diabetes: <7.0   09/05/2017 06:00 PM 9.6 (H) 4.8 - 5.6 % Final    Comment:    (NOTE) Pre diabetes:          5.7%-6.4% Diabetes:              >6.4% Glycemic control for   <7.0% adults with diabetes     CBG: Recent Labs  Lab 01/03/20 2334 01/04/20 0309 01/04/20 0747 01/04/20 1136 01/04/20  1540  GLUCAP 172* 180* 167* 161* 167*   CRITICAL CARE Performed by: Lynnell Catalan   Total critical care time: 40 minutes  Critical care time was exclusive of separately billable procedures and treating other patients.  Critical care was necessary to treat or prevent imminent or life-threatening deterioration.  Critical care was time spent personally by me on the following activities: development of treatment plan with patient and/or surrogate as well as nursing, discussions with consultants, evaluation of patient's response to treatment, examination of patient, obtaining history from patient or surrogate, ordering and performing treatments and interventions, ordering and review of laboratory studies, ordering and review of radiographic studies, pulse oximetry, re-evaluation of patient's condition and participation in multidisciplinary rounds.  Lynnell Catalan, MD [facility] ICU Physician [facility] [place] [facility]  Pager: [phone] Mobile: [phone] After hours: [phone].

## 2020-01-04 NOTE — Progress Notes (Signed)
eLink Physician-Brief Progress Note Patient Name: Courtney Zamora DOB: [birth year] MRN: [id number]   Date of Service  01/04/2020  HPI/Events of Note  Bilirubin 21  eICU Interventions  Ammonia level, CT abdomen / pelvis  Without contrast, RUQ ultrasound ordered. Patient will likely need GI consultation.        Thomasene Lot Nitesh Pitstick 01/04/2020, 5:47 AM

## 2020-01-04 NOTE — Progress Notes (Signed)
Reymundo Poll (spouse) updated about patients condition via phone.

## 2020-01-04 NOTE — Progress Notes (Signed)
Pharmacy Antibiotic Note  SOFIE SCHENDEL is a 34 y.o. female admitted on 01/02/2020 with seizure, DKA, and concern for sepsis with unknown source. Patient continuing on CRRT and Zosyn, now with worsening shock, pharmacy to change to meropenem.  Plan: Meropenem 1g IV q8h   Height: 5\' 4"  (162.6 cm) Weight: (!) 176.7 kg (389 lb 8.9 oz) IBW/kg (Calculated) : 54.7  Temp (24hrs), Avg:99.7 F (37.6 C), Min:98.3 F (36.8 C), Max:101.1 F (38.4 C)  Recent Labs  Lab 12/28/19 2153 12/29/19 0046 12/29/19 0103 12/29/19 0414 12/29/19 1532 12/30/19 0352 12/30/19 0837 12/30/19 1545 12/31/19 0738 12/31/19 1600 01/01/20 0502 01/01/20 0809 01/01/20 1537 01/02/20 0412 01/02/20 0412 01/02/20 1513 01/03/20 0348 01/03/20 1604 01/04/20 0309 01/04/20 0659 01/04/20 1553  WBC  --  9.3  --   --   --   --   --   --   --   --  8.2  --   --  18.5*  --   --   --   --  36.1* 35.3*  --   CREATININE   < >  --  5.74*  --    < > 3.81*  --    < >  --    < > 2.28*  --    < > 2.56*   < > 2.28* 2.23* 2.26* 2.17*  2.16*  --  2.11*  LATICACIDVEN  --   --  6.9*  --   --   --  2.9*  --  2.0*  --   --  3.8*  --   --   --   --   --   --   --   --   --   VANCORANDOM  --   --   --  33  --  18  --   --   --   --   --   --   --   --   --   --   --   --   --   --   --    < > = values in this interval not displayed.    Estimated Creatinine Clearance: 60.8 mL/min (A) (by C-G formula based on SCr of 2.11 mg/dL (H)).       01/06/20, PharmD, BCPS Clinical Pharmacist [phone] Please check AMION for all [facility] Pharmacy numbers 01/04/2020

## 2020-01-04 NOTE — Progress Notes (Signed)
CRITICAL VALUE ALERT  Critical Value:  Total bilirubin=21.0  Date & Time Notied:  01/04/20 0405  Provider Notified: elink  Orders Received/Actions taken: none at this time

## 2020-01-04 NOTE — Progress Notes (Signed)
eLink Physician-Brief Progress Note Patient Name: Courtney Zamora DOB: 11-05-1985 MRN: [id number]   Date of Service  01/04/2020  HPI/Events of Note  Patient needs an a.m. CXR order.  eICU Interventions  CXR ordered.        Mckinze Poirier U Arely Tinner 01/04/2020, 4:20 AM

## 2020-01-04 NOTE — Significant Event (Signed)
  Critical Care Note Increasing pressor requirements.  At start of shift, maxed on Vaso and NorEp --> added Epi due to dropping MAPs.  CRRT ongoing, discontinued pulling fluids.    Started on Meropenem.   Fentanyl for sedation turned off.  Not very responsive.   Tube feed discontinued 2 days ago due to vomiting per RN, now on wall suction with darkish GI content.   Notice rising total bilirubin. Currently not on nutrition.  All cultures have been negative. Non-con CT abd earlier this morning shows worsening pancreatitis, no biliary obstruction.  Today's Vitals   01/04/20 1945 01/04/20 2000 01/04/20 2015 01/04/20 2030  BP: (!) 93/57 (!) 100/59 116/60 132/63  Pulse: (!) 102 (!) 103 (!) 103 (!) 106  Resp: (!) 38 (!) 35 (!) 31 (!) 30  Temp:      TempSrc:      SpO2: 98% 98% 98% 98%  Weight:      Height:      PainSc:       Body mass index is 66.87 kg/m.  Gen:  Acutely ill morbidly obese young female on vent Chest: Coarse bilaterally CVS:  RRR, S1S2 present Abd:  Obese, reduce BS Ext:  No significant edema, erythema  A/P 1. Undifferentiated refractory shock, likely predominantly septic and some hypovolemia 2. Severe pancreatitis 3. Acute hypoxic respiratory failure 4. Acute encephalopathy 5. AKI 6. DKA/HHS resolved   - too unstable for pan-scan at this time - continue NorEp, Vaso and Epi --> if need additional pressors, would go with Angio II - will add hydrocortisone 50mg  q 6 hours - would consider GI consult due to severe pancreatitis, difficult to exclude necrosis with non-con CT - agree with Meropenem - continue CRRT, keep net even - consider TPN/PPN while unable to tolerate enteric feeding  40 minutes critical care time.  Discussed with bedside RN and Gleason, PA-C.  ___________________ Vernona Rieger. Drue Stager, MD [facility]

## 2020-01-05 LAB — CBC WITH DIFFERENTIAL/PLATELET
Abs Immature Granulocytes: 7.29 10*3/uL — ABNORMAL HIGH (ref 0.00–0.07)
Basophils Absolute: 0.1 10*3/uL (ref 0.0–0.1)
Basophils Relative: 0 %
Eosinophils Absolute: 0.1 10*3/uL (ref 0.0–0.5)
Eosinophils Relative: 0 %
HCT: 33.9 % — ABNORMAL LOW (ref 36.0–46.0)
Hemoglobin: 10 g/dL — ABNORMAL LOW (ref 12.0–15.0)
Immature Granulocytes: 20 %
Lymphocytes Relative: 6 %
Lymphs Abs: 2.1 10*3/uL (ref 0.7–4.0)
MCH: 29.2 pg (ref 26.0–34.0)
MCHC: 29.5 g/dL — ABNORMAL LOW (ref 30.0–36.0)
MCV: 98.8 fL (ref 80.0–100.0)
Monocytes Absolute: 1 10*3/uL (ref 0.1–1.0)
Monocytes Relative: 3 %
Neutro Abs: 25.3 10*3/uL — ABNORMAL HIGH (ref 1.7–7.7)
Neutrophils Relative %: 71 %
Platelets: 158 10*3/uL (ref 150–400)
RBC: 3.43 MIL/uL — ABNORMAL LOW (ref 3.87–5.11)
RDW: 18.8 % — ABNORMAL HIGH (ref 11.5–15.5)
WBC: 35.9 10*3/uL — ABNORMAL HIGH (ref 4.0–10.5)
nRBC: 16.1 % — ABNORMAL HIGH (ref 0.0–0.2)

## 2020-01-05 LAB — RENAL FUNCTION PANEL
Albumin: 2.1 g/dL — ABNORMAL LOW (ref 3.5–5.0)
Albumin: 2 g/dL — ABNORMAL LOW (ref 3.5–5.0)
Anion gap: 16 — ABNORMAL HIGH (ref 5–15)
Anion gap: 15 (ref 5–15)
BUN: 27 mg/dL — ABNORMAL HIGH (ref 6–20)
BUN: 27 mg/dL — ABNORMAL HIGH (ref 6–20)
CO2: 17 mmol/L — ABNORMAL LOW (ref 22–32)
CO2: 19 mmol/L — ABNORMAL LOW (ref 22–32)
Calcium: 8.1 mg/dL — ABNORMAL LOW (ref 8.9–10.3)
Calcium: 8.4 mg/dL — ABNORMAL LOW (ref 8.9–10.3)
Chloride: 102 mmol/L (ref 98–111)
Chloride: 103 mmol/L (ref 98–111)
Creatinine, Ser: 2.02 mg/dL — ABNORMAL HIGH (ref 0.44–1.00)
Creatinine, Ser: 1.77 mg/dL — ABNORMAL HIGH (ref 0.44–1.00)
GFR calc Af Amer: 36 mL/min — ABNORMAL LOW (ref >60)
GFR calc Af Amer: 42 mL/min — ABNORMAL LOW (ref >60)
GFR calc non Af Amer: 31 mL/min — ABNORMAL LOW (ref >60)
GFR calc non Af Amer: 37 mL/min — ABNORMAL LOW (ref >60)
Glucose, Bld: 257 mg/dL — ABNORMAL HIGH (ref 70–99)
Glucose, Bld: 215 mg/dL — ABNORMAL HIGH (ref 70–99)
Phosphorus: 5.2 mg/dL — ABNORMAL HIGH (ref 2.5–4.6)
Phosphorus: 5.2 mg/dL — ABNORMAL HIGH (ref 2.5–4.6)
Potassium: 5.6 mmol/L — ABNORMAL HIGH (ref 3.5–5.1)
Potassium: 4.4 mmol/L (ref 3.5–5.1)
Sodium: 135 mmol/L (ref 135–145)
Sodium: 137 mmol/L (ref 135–145)

## 2020-01-05 LAB — GLUCOSE, CAPILLARY
Glucose-Capillary: 240 mg/dL — ABNORMAL HIGH (ref 70–99)
Glucose-Capillary: 220 mg/dL — ABNORMAL HIGH (ref 70–99)
Glucose-Capillary: 204 mg/dL — ABNORMAL HIGH (ref 70–99)
Glucose-Capillary: 203 mg/dL — ABNORMAL HIGH (ref 70–99)
Glucose-Capillary: 188 mg/dL — ABNORMAL HIGH (ref 70–99)

## 2020-01-05 LAB — MAGNESIUM: Magnesium: 2.6 mg/dL — ABNORMAL HIGH (ref 1.7–2.4)

## 2020-01-05 LAB — HAPTOGLOBIN: Haptoglobin: 144 mg/dL (ref 33–278)

## 2020-01-05 LAB — LACTIC ACID, PLASMA: Lactic Acid, Venous: 1.8 mmol/L (ref 0.5–1.9)

## 2020-01-05 LAB — LIPASE, BLOOD: Lipase: 62 U/L — ABNORMAL HIGH (ref 11–51)

## 2020-01-05 MED ORDER — PRISMASOL BGK 4/2.5 32-4-2.5 MEQ/L IV SOLN
INTRAVENOUS | Status: DC
  Filled 2020-01-05 (×48): qty 5000

## 2020-01-05 MED ORDER — PRISMASOL BGK 0/2.5 32-2.5 MEQ/L REPLACEMENT SOLN
Status: DC
  Filled 2020-01-05 (×3): qty 5000

## 2020-01-05 MED ORDER — HEPARIN (PORCINE) 2000 UNITS/L FOR CRRT: INTRAVENOUS_CENTRAL | Status: DC | PRN

## 2020-01-05 MED ORDER — INSULIN DETEMIR 100 UNIT/ML ~~LOC~~ SOLN
20.0000 [IU] | Freq: Two times a day (BID) | SUBCUTANEOUS | Status: DC
  Administered 2020-01-05 – 2020-01-07 (×5): 20 [IU] via SUBCUTANEOUS
  Filled 2020-01-05 (×6): qty 0.2

## 2020-01-05 MED ORDER — HEPARIN SODIUM (PORCINE) 1000 UNIT/ML DIALYSIS: 1000.0000 [IU] | INTRAMUSCULAR | Status: DC | PRN

## 2020-01-05 MED ORDER — PRISMASOL BGK 4/2.5 32-4-2.5 MEQ/L REPLACEMENT SOLN
Status: DC
  Filled 2020-01-05 (×8): qty 5000

## 2020-01-05 NOTE — Progress Notes (Signed)
NAME:  Courtney Zamora, MRN:  [id number], DOB:  03-May-1986, LOS: 9 ADMISSION DATE:  01/02/2020, CONSULTATION DATE:  01/05/20 REFERRING MD:  EDP, CHIEF COMPLAINT:  seizures   Brief History   34 y.o. F with PMH of RA, Type 2 DM, possible seizures, HTN who presented with shaking of abnormal jerking movements thought to be seizures, AMS and hyperglycemia.  Pt loaded with Keppra, head CT negative, also developed fever and had a significant lactic acidosis.  Given AMS, PCCM consulted for admission.  History of present illness   Courtney Zamora is a  RA on cellcept, Type 2 DM, possible seizures (no prior EEG and no medications per husband), HTN who presented with shaking of abnormal jerking movements thought to be seizures, AMS and hyperglycemia.   He reports that about two days ago she was having trouble holding a cup because of weakness and was only eating fruit because everything tasted metallic.  He states her blood sugar was high, but it usually is.  She had some constipation, but did not complain of abdominal pain, diarrhea, nausea, vomiting or fever.  Earlier in the day, he noted her L arm shaking, he thought she was having a seizure, though this was different than her usual seizure.  These he describes as being diagnosed by a PCP and not on any medication.  She was sometimes scratch her face and pull her hair and then be fine immediately afterwards, she does not seek medical care after these episodes.  In the ED, pt was having continued twitching/jerking of the L side and was loaded with Keppra. CT head negative.  ED course significant for worsening mental status, oxygen requirement, lactic acid 10.8, K 2.4, Glucose >1100, anion gap 25, Co2 34.  Pt was able to intermittently answer questions and c/o abdominal pain.   She was given K and 3L IVF, she then spiked a fever to 101F.  Pt seen by neurology and EEG did not show evidence of seizures.   PCCM consulted for admission  Past Medical History    has a past medical history of Anxiety, Asthma, Chronic headache, Chronic lower back pain, Depression, Essential hypertension, Family history of adverse reaction to anesthesia, GERD (gastroesophageal reflux disease), Manic state (HCC), Migraine, Obesity, OSA on CPAP, Osteoarthritis, knee, Pneumonia, Prolonged QT syndrome, PTSD (post-traumatic stress disorder), Rheumatoid arthritis (HCC), Seizures (HCC), Severe needle phobia, and Type II diabetes mellitus (HCC).   Significant Hospital Events   6/11 Admit to [facility] 6/13 still profoundly sick, septic requiring 3 pressors   Consults:  neurology Renal: CRRT  Procedures:  6/12 endotracheal intubation 6/12 central line placement in the right IJ 6/13 left IJ central line placement 6/13 right IJ hemodialysis catheter 6/18 left foot arterial line placement  Significant Diagnostic Tests:  6/11 CT head>> limited due to patient motion, no acute findings 6/11 CT abd/pelvis>> There is some mild fat stranding about the pancreas, which may represent acute pancreatitis. There is some mild wall thickening of the duodenum, which may be reactive or secondary to duodenitis. 6/12 chest x-ray following IJ and endotracheal tube-line in good placement, ET tube adequately placed OG tube adequately placed 6/19-CT abdomen pelvis shows no biliary obstruction.  Worsening pancreatitis.  Significant rectal stool burden.  Micro Data:  6/12 BCx2 >> no growth x4 days  6/12 UC>> NG 6/16 BCx2 >> NG@ 24h Antimicrobials:  Zosyn 6/12-6/18 Vancomycin 6/12- 6/17  Interim history/subjective:   Remains in vasopressor dependent shock.  Minimally responsive.  More jaundiced.  Decreasing  vasopressor requirements.  Objective   Blood pressure (!) 106/43, pulse 80, temperature 99.9 F (37.7 C), temperature source Axillary, resp. rate (!) 27, height 5\' 4"  (1.626 m), weight (!) 178.9 kg, SpO2 100 %. CVP:  [0 mmHg-13 mmHg] 13 mmHg  Vent Mode: PRVC FiO2 (%):  [40 %] 40 % Set Rate:   [16 bmp] 16 bmp Vt Set:  [440 mL] 440 mL PEEP:  [8 cmH20] 8 cmH20 Plateau Pressure:  [20 cmH20-27 cmH20] 20 cmH20   Intake/Output Summary (Last 24 hours) at 01/05/2020 1315 Last data filed at 01/05/2020 1300 Gross per 24 hour  Intake 3334.61 ml  Output 2007 ml  Net 1327.61 ml   Filed Weights   01/03/20 2300 01/04/20 0326 01/05/20 0500  Weight: (!) 176.7 kg (!) 176.7 kg (!) 178.9 kg    General:  Obese F, comatose on vent HEENT: MM pink/moist, scleral icterus present, endotracheal tube in place Neuro: Does not follow commands in any extremities, does withdrawal from pain CV: S1-S2 appreciated PULM: Coarse breath sounds bilaterally  GI: soft, obese, bs distant.  No pain to palpation.  Dark brown-reddish stool.  Resolved Hospital Problem list     Assessment & Plan:   Critically ill due to acute hypoxic respiratory failure with tachypnea Critically ill due to septic shock requiring titration of vasopressors Critically ill due to acute kidney injury requiring CRRT Presented with hyperosmolar hyperglycemic state now resolved Acute pancreatitis appears to be the underlying cause.  Gallbladder sludge noted but no acute obstruction. Morbid obesity with limited exercise tolerance. Poorly controlled type 2 diabetes.  Plan:  Appears to be improving in the last 24 hours with addition of meropenem.  Suspect some degree of pancreatic necrosis although not seen on CT. Continue antibiotics for 10 days.  Lipase is negative. Will reinitiate enteral nutrition via postpyloric tube given prior tube feed intolerance. Continue to wean vasopressors as tolerated. Volume overloaded due to extensive third spacing.  CVP's remain within normal range limiting fluid removal on CRRT. Prolonged mechanical ventilation and patient with borderline respiratory function at baseline.  She has already been consented for tracheostomy.  Best practice:  Diet: Tube Feeds as tolerated Pain/Anxiety/Delirium protocol  (if indicated): Fentanyl gtt VAP protocol (if indicated): In place DVT prophylaxis: SCDs GI prophylaxis: Protonix Glucose control: SSI Mobility: bed rest Code Status: full code Family Communication: Husband at the bedside Disposition: ICU  Labs   CBC: Recent Labs  Lab 01/01/20 0502 01/01/20 0502 01/02/20 01/04/20 01/02/20 0925 01/04/20 0309 01/04/20 0659 01/05/20 0329  WBC 8.2  --  18.5*  --  36.1* 35.3* 35.9*  NEUTROABS  --   --   --   --   --  24.2* 25.3*  HGB 12.3   < > 12.7 13.6 11.4* 11.3* 10.0*  HCT 42.4   < > 43.2 40.0 38.7 39.1 33.9*  MCV 101.0*  --  99.8  --  100.0 100.3* 98.8  PLT 76*  --  94*  --  145* 154 158   < > = values in this interval not displayed.    Basic Metabolic Panel: Recent Labs  Lab 01/01/20 0502 01/01/20 1537 01/02/20 0412 01/02/20 0925 01/03/20 0348 01/03/20 1604 01/04/20 0309 01/04/20 1553 01/05/20 0329  NA 135   < > 137   < > 135 136 135  135 135 135  K 4.5   < > 4.5   < > 5.1 5.4* 5.0  5.0 5.3* 5.6*  CL 99   < > 100   < >  99 100 101  101 102 102  CO2 20*   < > 20*   < > 19* 18* 17*  18* 17* 17*  GLUCOSE 149*   < > 167*   < > 159* 198* 185*  182* 182* 257*  BUN 23*   < > 27*   < > 25* 27* 28*  27* 28* 27*  CREATININE 2.28*   < > 2.56*   < > 2.23* 2.26* 2.17*  2.16* 2.11* 2.02*  CALCIUM 8.3*   < > 8.0*   < > 8.4* 8.3* 8.3*  8.4* 8.4* 8.1*  MG 2.9*  --  2.9*  --  2.7*  --  2.7*  --  2.6*  PHOS 2.3*   < > 3.7   < > 3.9 4.9* 5.5* 5.3* 5.2*   < > = values in this interval not displayed.   GFR: Estimated Creatinine Clearance: 64.1 mL/min (A) (by C-G formula based on SCr of 2.02 mg/dL (H)). Recent Labs  Lab 12/31/19 0738 01/01/20 0502 01/01/20 0809 01/02/20 0412 01/04/20 0309 01/04/20 0659 01/04/20 1952 01/05/20 0329 01/05/20 0718  WBC  --    < >  --  18.5* 36.1* 35.3*  --  35.9*  --   LATICACIDVEN 2.0*  --  3.8*  --   --   --  3.6*  --  1.8   < > = values in this interval not displayed.    Liver Function  Tests: Recent Labs  Lab 12/31/19 0509 12/31/19 1600 01/01/20 0502 01/01/20 1537 01/02/20 0412 01/02/20 1513 01/03/20 1604 01/04/20 0309 01/04/20 1553 01/04/20 2016 01/05/20 0329  AST 473*  --  291*  --  239*  --   --  186*  --  178*  --   ALT 353*  --  262*  --  186*  --   --  113*  --  94*  --   ALKPHOS 256*  --  434*  --  596*  --   --  986*  --  1,014*  --   BILITOT 11.6*  --  15.1*  --  16.0*  --   --  21.0*  --  21.6*  --   PROT 5.9*  --  6.2*  --  5.7*  --   --  5.5*  --  5.7*  --   ALBUMIN 3.2*   < > 3.0*   < > 2.4*   < > 2.2* 2.0*  2.0* 2.1* 2.2* 2.1*   < > = values in this interval not displayed.   Recent Labs  Lab 01/04/20 1553 01/05/20 0329  LIPASE 76* 62*   Recent Labs  Lab 01/04/20 0605  AMMONIA 66*    ABG    Component Value Date/Time   HCO3 21.5 01/02/2020 0925   TCO2 23 01/02/2020 0925   ACIDBASEDEF 4.0 (H) 01/02/2020 0925   O2SAT 50.0 01/02/2020 0925     Coagulation Profile: No results for input(s): INR, PROTIME in the last 168 hours.  Cardiac Enzymes: No results for input(s): CKTOTAL, CKMB, CKMBINDEX, TROPONINI in the last 168 hours.  HbA1C: Hemoglobin A1C  Date/Time Value Ref Range Status  10/25/2019 08:58 AM 9.7 (A) 4.0 - 5.6 % Final  04/04/2019 11:07 AM 9.8 (A) 4.0 - 5.6 % Final   Hgb A1c MFr Bld  Date/Time Value Ref Range Status  01/07/2020 11:00 PM >15.5 (H) 4.8 - 5.6 % Final    Comment:    (NOTE) **Verified by repeat analysis**  Prediabetes: 5.7 - 6.4         Diabetes: >6.4         Glycemic control for adults with diabetes: <7.0   09/05/2017 06:00 PM 9.6 (H) 4.8 - 5.6 % Final    Comment:    (NOTE) Pre diabetes:          5.7%-6.4% Diabetes:              >6.4% Glycemic control for   <7.0% adults with diabetes     CBG: Recent Labs  Lab 01/04/20 2020 01/04/20 2338 01/05/20 0333 01/05/20 0752 01/05/20 1142  GLUCAP 171* 207* 240* 220* 204*   CRITICAL CARE Performed by: Lynnell Catalan   Total critical care  time: 40 minutes  Critical care time was exclusive of separately billable procedures and treating other patients.  Critical care was necessary to treat or prevent imminent or life-threatening deterioration.  Critical care was time spent personally by me on the following activities: development of treatment plan with patient and/or surrogate as well as nursing, discussions with consultants, evaluation of patient's response to treatment, examination of patient, obtaining history from patient or surrogate, ordering and performing treatments and interventions, ordering and review of laboratory studies, ordering and review of radiographic studies, pulse oximetry, re-evaluation of patient's condition and participation in multidisciplinary rounds.  Lynnell Catalan, MD [facility] ICU Physician [facility] [place] [facility]  Pager: [phone] Mobile: [phone] After hours: [phone].

## 2020-01-05 NOTE — Progress Notes (Signed)
Patients' father updated via telephone.

## 2020-01-05 NOTE — Progress Notes (Signed)
[facility] ROUNDING NOTE   Subjective:   [name] diabetes rheumatoid arthritis chronic CellCept possible seizure disorder presented 12/24/2019 with altered mental status seizures and diabetic ketoacidosis.  She developed acute kidney injury  and anuric renal failure in the setting of sepsis syndrome and acute pancreatitis.  She is remained encephalopathic and has become more hypotensive.   Marland Kitchen  Her baseline creatinine appears to be in the normal range.  The etiology of her renal failure appears to be secondary to ischemic acute tubular necrosis in setting of septic shock and diabetes ketoacidosis.  Renal ultrasound showed normal right and left kidney with an unremarkable CT scan.  CRRT was initiated 12/29/2019.  She is being kept even on CRRT.  Blood pressure 106/56 pulse 95 temperature 99.1 O2 sats 97% FiO2 40%  Sodium 135 potassium 5.6 chloride 102 CO2 17 glucose 256 creatinine 2 calcium 8.1 phosphorus 5.2 magnesium 2.6 hemoglobin 10  Keppra 1 g twice daily, Protonix 40 mg daily  Protonix 40 mg daily IV fentanyl IV insulin IV norepinephrine   IV vasopressin IV epinephrine IV Keppra IV hydrocortisone 50 mg every 6 hours 01/04/2020 IV meropenem 500 mg every 8 hours 01/04/2020   CT scan 01/04/2020 showed worsening peripancreatic inflammation complex right ovarian cyst and worsening opacification over the lung base.  Liver was consistent with steatosis    Objective:  Vital signs in last 24 hours:  Temp:  [98.3 F (36.8 C)-100.5 F (38.1 C)] 99.1 F (37.3 C) (06/20 0344) Pulse Rate:  [92-112] 96 (06/20 0645) Resp:  [21-48] 33 (06/20 0645) BP: (63-156)/(23-77) 109/57 (06/20 0645) SpO2:  [94 %-100 %] 98 % (06/20 0645) Arterial Line BP: (85-168)/(40-70) 129/51 (06/20 0645) FiO2 (%):  [40 %] 40 % (06/20 0435) Weight:  [178.9 kg] 178.9 kg (06/20 0500)  Weight change: 2.2 kg Filed Weights   01/03/20 2300 01/04/20 0326 01/05/20 0500  Weight: (!) 176.7 kg  (!) 176.7 kg (!) 178.9 kg    Intake/Output: I/O last 3 completed shifts: In: 2762.3 [I.V.:1903.9; NG/GT:410; IV Piggyback:448.4] Out: 9702 [Emesis/NG output:1150; Other:2107]   Intake/Output this shift:  Total I/O In: 2280.9 [I.V.:1332; NG/GT:20; IV Piggyback:928.9] Out: 802 [Emesis/NG output:400; Other:402]     General exam: Ill-looking female sedated and intubated Respiratory system: Coarse mechanical breath sound bilateral Cardiovascular system: S1 & S2 heard, RRR.  Edema present in all extremities. Gastrointestinal system: Abdomen is distended, soft. Central nervous system: Sedated and not responding Extremities: Edematous, no cyanosis or clubbing Skin: No rashes, lesions or ulcers Psychiatry: Unable to assess as patient is sedated.   Basic Metabolic Panel: Recent Labs  Lab 01/01/20 0502 01/01/20 1537 01/02/20 0412 01/02/20 0925 01/03/20 0348 01/03/20 0348 01/03/20 1604 01/03/20 1604 01/04/20 0309 01/04/20 1553 01/05/20 0329  NA 135   < > 137   < > 135  --  136  --  135   135 135 135  K 4.5   < > 4.5   < > 5.1  --  5.4*  --  5.0   5.0 5.3* 5.6*  CL 99   < > 100   < > 99  --  100  --  101   101 102 102  CO2 20*   < > 20*   < > 19*  --  18*  --  17*   18* 17* 17*  GLUCOSE 149*   < > 167*   < > 159*  --  198*  --  185*   182*  182* 257*  BUN 23*   < > 27*   < > 25*  --  27*  --  28*   27* 28* 27*  CREATININE 2.28*   < > 2.56*   < > 2.23*  --  2.26*  --  2.17*   2.16* 2.11* 2.02*  CALCIUM 8.3*   < > 8.0*   < > 8.4*   < > 8.3*   < > 8.3*   8.4* 8.4* 8.1*  MG 2.9*  --  2.9*  --  2.7*  --   --   --  2.7*  --  2.6*  PHOS 2.3*   < > 3.7   < > 3.9  --  4.9*  --  5.5* 5.3* 5.2*   < > = values in this interval not displayed.    Liver Function Tests: Recent Labs  Lab 12/31/19 0509 12/31/19 1600 01/01/20 0502 01/01/20 1537 01/02/20 0412 01/02/20 1513 01/03/20 1604 01/04/20 0309 01/04/20 1553 01/04/20 2016 01/05/20 0329  AST 473*  --  291*  --  239*  --   --  186*   --  178*  --   ALT 353*  --  262*  --  186*  --   --  113*  --  94*  --   ALKPHOS 256*  --  434*  --  596*  --   --  986*  --  1,014*  --   BILITOT 11.6*  --  15.1*  --  16.0*  --   --  21.0*  --  21.6*  --   PROT 5.9*  --  6.2*  --  5.7*  --   --  5.5*  --  5.7*  --   ALBUMIN 3.2*   < > 3.0*   < > 2.4*   < > 2.2* 2.0*   2.0* 2.1* 2.2* 2.1*   < > = values in this interval not displayed.   Recent Labs  Lab 01/04/20 1553 01/05/20 0329  LIPASE 76* 62*   Recent Labs  Lab 01/04/20 0605  AMMONIA 66*    CBC: Recent Labs  Lab 01/01/20 0502 01/01/20 0502 01/02/20 0412 01/02/20 0925 01/04/20 0309 01/04/20 0659 01/05/20 0329  WBC 8.2  --  18.5*  --  36.1* 35.3* PENDING  NEUTROABS  --   --   --   --   --  24.2* PENDING  HGB 12.3   < > 12.7 13.6 11.4* 11.3* 10.0*  HCT 42.4   < > 43.2 40.0 38.7 39.1 33.9*  MCV 101.0*  --  99.8  --  100.0 100.3* 98.8  PLT 76*  --  94*  --  145* 154 158   < > = values in this interval not displayed.    Cardiac Enzymes: No results for input(s): CKTOTAL, CKMB, CKMBINDEX, TROPONINI in the last 168 hours.  BNP: Invalid input(s): POCBNP  CBG: Recent Labs  Lab 01/04/20 1136 01/04/20 1540 01/04/20 2020 01/04/20 2338 01/05/20 0333  GLUCAP 161* 167* 171* 207* 240*    Microbiology: Results for orders placed or performed during the hospital encounter of 01/10/2020  SARS Coronavirus 2 by RT PCR (hospital order, performed in [facility] hospital lab) Nasopharyngeal Nasopharyngeal Swab     Status: None   Collection Time: 01/13/2020  9:38 PM   Specimen: Nasopharyngeal Swab  Result Value Ref Range Status   SARS Coronavirus 2 NEGATIVE NEGATIVE Final    Comment: (NOTE) SARS-CoV-2 target nucleic acids are NOT DETECTED.  The SARS-CoV-2 RNA is generally detectable in upper and lower respiratory specimens during the acute phase of infection. The lowest concentration of SARS-CoV-2 viral copies this assay can detect is 250 copies / mL. A negative result  does not preclude SARS-CoV-2 infection and should not be used as the sole basis for treatment or other patient management decisions.  A negative result may occur with improper specimen collection / handling, submission of specimen other than nasopharyngeal swab, presence of viral mutation(s) within the areas targeted by this assay, and inadequate number of viral copies (<250 copies / mL). A negative result must be combined with clinical observations, patient history, and epidemiological information.  Fact Sheet for Patients:   StrictlyIdeas.no  Fact Sheet for Healthcare Providers: BankingDealers.co.za  This test is not yet approved or  cleared by the [place] FDA and has been authorized for detection and/or diagnosis of SARS-CoV-2 by FDA under an Emergency Use Authorization (EUA).  This EUA will remain in effect (meaning this test can be used) for the duration of the COVID-19 declaration under Section 564(b)(1) of the Act, 21 U.S.C. section 360bbb-3(b)(1), unless the authorization is terminated or revoked sooner.  Performed at [facility], [place] [address], [place], [place] [address]   MRSA PCR Screening     Status: None   Collection Time: 12/28/19  1:07 AM   Specimen: Nasal Mucosa; Nasopharyngeal  Result Value Ref Range Status   MRSA by PCR NEGATIVE NEGATIVE Final    Comment:        The GeneXpert MRSA Assay (FDA approved for NASAL specimens only), is one component of a comprehensive MRSA colonization surveillance program. It is not intended to diagnose MRSA infection nor to guide or monitor treatment for MRSA infections. Performed at [facility], [place] [address], [place], [place] [address]   Culture, Urine     Status: None   Collection Time: 12/28/19  1:15 AM   Specimen: Urine, Catheterized  Result Value Ref Range Status   Specimen Description URINE, CATHETERIZED  Final   Special Requests NONE  Final    Culture   Final    NO GROWTH Performed at [facility] [address] [place], [place] [address]    Report Status 12/29/2019 FINAL  Final  Culture, blood (routine x 2)     Status: None   Collection Time: 12/28/19  2:49 AM   Specimen: BLOOD  Result Value Ref Range Status   Specimen Description BLOOD LEFT ANTECUBITAL  Final   Special Requests   Final    BOTTLES DRAWN AEROBIC ONLY Blood Culture adequate volume   Culture   Final    NO GROWTH 5 DAYS Performed at [facility], [place] [address], [place], [place] [address]    Report Status 01/02/2020 FINAL  Final  Culture, blood (routine x 2)     Status: None   Collection Time: 12/28/19  2:49 AM   Specimen: BLOOD RIGHT HAND  Result Value Ref Range Status   Specimen Description BLOOD RIGHT HAND  Final   Special Requests   Final    BOTTLES DRAWN AEROBIC ONLY Blood Culture adequate volume   Culture   Final    NO GROWTH 5 DAYS Performed at [facility], [place] [address], [place], [place] [address]    Report Status 01/02/2020 FINAL  Final  Culture, blood (routine x 2)     Status: None (Preliminary result)   Collection Time: 01/02/20  7:26 AM  Specimen: BLOOD RIGHT HAND  Result Value Ref Range Status   Specimen Description BLOOD RIGHT HAND  Final   Special Requests   Final    BOTTLES DRAWN AEROBIC ONLY Blood Culture adequate volume   Culture   Final    NO GROWTH 2 DAYS Performed at [facility], [address] [place], [place] [address]    Report Status PENDING  Incomplete  Culture, blood (routine x 2)     Status: None (Preliminary result)   Collection Time: 01/02/20  7:26 AM   Specimen: BLOOD RIGHT HAND  Result Value Ref Range Status   Specimen Description BLOOD RIGHT HAND  Final   Special Requests   Final    BOTTLES DRAWN AEROBIC ONLY Blood Culture adequate volume   Culture   Final    NO GROWTH 2 DAYS Performed at [facility], [place] [address], [place], [place] [address]    Report Status PENDING   Incomplete    Coagulation Studies: No results for input(s): LABPROT, INR in the last 72 hours.  Urinalysis: No results for input(s): COLORURINE, LABSPEC, PHURINE, GLUCOSEU, HGBUR, BILIRUBINUR, KETONESUR, PROTEINUR, UROBILINOGEN, NITRITE, LEUKOCYTESUR in the last 72 hours.  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 Patient unable to provide any information. Acute renal insufficiency. EXAM: CT ABDOMEN AND PELVIS WITHOUT CONTRAST TECHNIQUE: Multidetector CT imaging of the abdomen and pelvis was performed following the standard protocol without IV contrast. COMPARISON:  12/28/2019 and 02/15/2019, abdominal ultrasound 01/04/2020 FINDINGS: Lower chest: Lung bases demonstrate mild bibasilar heterogeneous opacification likely atelectasis although infection is possible. These findings are worse compared to the recent prior exam. Central venous catheter tip over the right atrium. Hepatobiliary: Moderate diffuse low-attenuation of the liver without focal mass compatible with a degree of steatosis. No definite gallstones. Increased density of the gallbladder likely secondary to the significant low attenuation of the adjacent liver. Note that there are no gallstones or sludge seen on patient's recent ultrasound. Biliary tree is normal. Pancreas: Slight interval worsening of ill definition and stranding of the peripancreatic fat planes suggesting acute pancreatitis. No evidence of pancreatic necrosis. Increased patchy peripancreatic fluid worse adjacent the tail the pancreas extending to the left pericolic gutter. Spleen: Normal. Adrenals/Urinary Tract: Adrenal glands are normal. Kidneys are normal in size without hydronephrosis or nephrolithiasis. Ureters and bladder are normal. Stomach/Bowel: Nasogastric tube in the stomach with tip over the body of the stomach. Small bowel is  unremarkable. Appendix is normal. Colon is unremarkable. Vascular/Lymphatic: Abdominal aorta is normal.  No adenopathy. Reproductive: Uterus and left ovary normal. Oval 5.6 cm focus of low attenuation abutting the uterine fundus and right ovary which is new. This may represent a right ovarian complex cystic process. Other: None. Musculoskeletal: No focal abnormality. IMPRESSION: 1. Interval worsening of peripancreatic inflammation and fluid suggesting progression of acute pancreatitis. No evidence of necrosis. 2. Oval 5.6 cm focus of low attenuation abutting the right ovary which is new. This may represent a complex cystic right ovarian mass. Recommend pelvic ultrasound on an elective basis for further evaluation. 3. Worsening heterogeneous opacification over the lung bases likely atelectasis although infection is possible. 4.  Moderate low-attenuation of the liver compatible with steatosis. Electronically Signed   By: Marin Olp M.D.   On: 01/04/2020 09:31   DG Chest Port 1 View  Result Date: 01/04/2020 CLINICAL DATA:  Initial evaluation for acute  respiratory failure. EXAM: PORTABLE CHEST 1 VIEW COMPARISON:  Prior radiograph from 12/31/2019. FINDINGS: Endotracheal tube in place with tip positioned at 2 cm above the carina. Right IJ approach central venous catheter remains in place with tip overlying the distal SVC. Additional left-sided central venous catheter terminates near the cavoatrial junction. Enteric tube courses into the abdomen, tip and side hole not visualized. Stable heart size. Mediastinal silhouette also unchanged, and remains within normal limits. Lungs are hypoinflated. Diffuse pulmonary vascular congestion with bibasilar subsegmental atelectasis. No new consolidative airspace disease. No visible pleural effusion. No pneumothorax. Visualized soft tissues and osseous structures are unchanged. IMPRESSION: 1. Tip of the endotracheal tube 2 cm above the carina. Remaining support apparatus in  satisfactory position. 2. Shallow lung inflation with diffuse pulmonary vascular congestion and bibasilar subsegmental atelectasis. Electronically Signed   By: Jeannine Boga M.D.   On: 01/04/2020 06:37   DG Abd Portable 1V  Result Date: 01/03/2020 CLINICAL DATA:  Constipation EXAM: PORTABLE ABDOMEN - 1 VIEW COMPARISON:  None. FINDINGS: The study is very limited due to patient body habitus and poor penetration. No evidence of bowel obstruction. No free air, portal venous gas, or pneumatosis. A feeding tube terminates in the stomach. There appears to be some fecal loading in the distal sigmoid colon and rectum. IMPRESSION: There appears to be some fecal loading in the distal sigmoid colon and rectum. No other abnormalities identified. Limited study due to patient body habitus and poor penetration. Electronically Signed   By: Dorise Bullion III M.D   On: 01/03/2020 12:19   US Abdomen Limited RUQ  Result Date: 01/04/2020 CLINICAL DATA:  Initial evaluation for hepatic biliary anomaly. EXAM: ULTRASOUND ABDOMEN LIMITED RIGHT UPPER QUADRANT COMPARISON:  None. FINDINGS: Gallbladder: Gallbladder partially distended without internal stones or sludge. Gallbladder wall measures at the upper limits of normal at 3-4 mm, likely related to partial distention. No free pericholecystic fluid. No sonographic Murphy sign elicited on exam. Common bile duct: Diameter: 2.5 mm Liver: No focal lesion identified. Dense echogenic echotexture seen throughout the liver. Portal vein is patent on color Doppler imaging with normal direction of blood flow towards the liver. Other: None. IMPRESSION: 1. Normal sonographic evaluation of the gallbladder. No cholelithiasis, evidence for acute cholecystitis, or biliary dilatation. 2. Increased echogenicity throughout the hepatic parenchyma, which can be seen with steatosis or other intrinsic hepatocellular disease. Electronically Signed   By: Jeannine Boga M.D.   On: 01/04/2020 07:13      Medications:     prismasol BGK 4/2.5 500 mL/hr at 01/05/20 0532    prismasol BGK 4/2.5 300 mL/hr at 01/04/20 1603   sodium chloride Stopped (12/28/19 1117)   sodium chloride Stopped (12/29/19 0755)   epinephrine 3 mcg/min (01/05/20 0700)   feeding supplement (VITAL HIGH PROTEIN) Stopped (01/02/20 2302)   fentaNYL infusion INTRAVENOUS 50 mcg/hr (01/05/20 0700)   heparin Stopped (01/02/20 2100)   insulin Stopped (01/03/20 1023)   meropenem (MERREM) IV Stopped (01/05/20 0539)   norepinephrine (LEVOPHED) Adult infusion 29 mcg/min (01/05/20 0700)   prismasol BGK 4/2.5 1,500 mL/hr at 01/05/20 0517   sodium chloride     vasopressin (PITRESSIN) infusion - *FOR SHOCK* 0.04 Units/min (01/05/20 0700)    B-complex with vitamin C  1 tablet Per Tube Daily   chlorhexidine gluconate (MEDLINE KIT)  15 mL Mouth Rinse BID   Chlorhexidine Gluconate Cloth  6 each Topical Q0600   docusate  100 mg Per Tube BID   feeding supplement (PRO-STAT SUGAR FREE 64)  30 mL Per Tube Daily   heparin  5,000 Units Subcutaneous Q8H   hydrocortisone sod succinate (SOLU-CORTEF) inj  50 mg Intravenous Q6H   insulin aspart  3-9 Units Subcutaneous Q4H   insulin detemir  15 Units Subcutaneous Q12H   levETIRAcetam  1,000 mg Per Tube BID   mouth rinse  15 mL Mouth Rinse 10 times per day   pantoprazole sodium  40 mg Per Tube Daily   polyethylene glycol  17 g Per Tube Daily   sodium chloride flush  10-40 mL Intracatheter Q12H   sodium chloride, bisacodyl, dextrose, fentaNYL, heparin, heparin, midazolam, opium-belladonna, pneumococcal 23 valent vaccine, sodium chloride flush, sorbitol, milk of mag, mineral oil, glycerin (SMOG) enema  Assessment/ Plan:   Acute kidney injury secondary ischemic ATN in setting of sepsis and acute severe pancreatitis, shock diabetic ketoacidosis anuric.  CT scan and ultrasound showed no evidence of hydronephrosis CRRT initiated 12/29/2019.   Hyperkalemia.  Will  take potassium out of replacement fluids.  01/05/2020  Hypotension/shock continues on vasopressin and Levophed and epinephrine as well as hydrocortisone.  Kept even on CRRT.  Ventilator dependent respiratory failure  Lactic acidosis secondary to septic shock and pancreatitis  Diabetes mellitus with ketoacidosis.  IV insulin  Encephalopathy generalized continuous slowing and triphasic waves.  Repeat CT scan of head unremarkable 01/02/2020.  Appreciate assistance of Dr. Rory Percy.  Patient unable to receive MRI due to body habitus continues on Keppra appreciate assistance from neurology  Septic shock.  Meropenem started 500 mg every 8 hours 01/04/2020  Hypophosphatemia improved    LOS: [place] '@TODAY''@7'$ :00 AM

## 2020-01-05 NOTE — Progress Notes (Signed)
Husband updated via telephone about patients' current condition.

## 2020-01-06 ENCOUNTER — Inpatient Hospital Stay (HOSPITAL_COMMUNITY): Payer: Medicaid Other

## 2020-01-06 DIAGNOSIS — K859 Acute pancreatitis without necrosis or infection, unspecified: Secondary | ICD-10-CM

## 2020-01-06 DIAGNOSIS — Z9911 Dependence on respirator [ventilator] status: Secondary | ICD-10-CM

## 2020-01-06 DIAGNOSIS — J9601 Acute respiratory failure with hypoxia: Secondary | ICD-10-CM

## 2020-01-06 LAB — CBC WITH DIFFERENTIAL/PLATELET
Abs Immature Granulocytes: 3.47 10*3/uL — ABNORMAL HIGH (ref 0.00–0.07)
Basophils Absolute: 0.1 10*3/uL (ref 0.0–0.1)
Basophils Relative: 0 %
Eosinophils Absolute: 0 10*3/uL (ref 0.0–0.5)
Eosinophils Relative: 0 %
HCT: 32.8 % — ABNORMAL LOW (ref 36.0–46.0)
Hemoglobin: 9.5 g/dL — ABNORMAL LOW (ref 12.0–15.0)
Immature Granulocytes: 14 %
Lymphocytes Relative: 5 %
Lymphs Abs: 1.2 10*3/uL (ref 0.7–4.0)
MCH: 29.5 pg (ref 26.0–34.0)
MCHC: 29 g/dL — ABNORMAL LOW (ref 30.0–36.0)
MCV: 101.9 fL — ABNORMAL HIGH (ref 80.0–100.0)
Monocytes Absolute: 0.7 10*3/uL (ref 0.1–1.0)
Monocytes Relative: 3 %
Neutro Abs: 18.7 10*3/uL — ABNORMAL HIGH (ref 1.7–7.7)
Neutrophils Relative %: 78 %
Platelets: 154 10*3/uL (ref 150–400)
RBC: 3.22 MIL/uL — ABNORMAL LOW (ref 3.87–5.11)
RDW: 19.3 % — ABNORMAL HIGH (ref 11.5–15.5)
WBC: 24.2 10*3/uL — ABNORMAL HIGH (ref 4.0–10.5)
nRBC: 3.5 % — ABNORMAL HIGH (ref 0.0–0.2)

## 2020-01-06 LAB — RENAL FUNCTION PANEL
Albumin: 1.8 g/dL — ABNORMAL LOW (ref 3.5–5.0)
Albumin: 1.4 g/dL — ABNORMAL LOW (ref 3.5–5.0)
Anion gap: 17 — ABNORMAL HIGH (ref 5–15)
Anion gap: 13 (ref 5–15)
BUN: 26 mg/dL — ABNORMAL HIGH (ref 6–20)
BUN: 21 mg/dL — ABNORMAL HIGH (ref 6–20)
CO2: 18 mmol/L — ABNORMAL LOW (ref 22–32)
CO2: 18 mmol/L — ABNORMAL LOW (ref 22–32)
Calcium: 8.1 mg/dL — ABNORMAL LOW (ref 8.9–10.3)
Calcium: 6.9 mg/dL — ABNORMAL LOW (ref 8.9–10.3)
Chloride: 101 mmol/L (ref 98–111)
Chloride: 109 mmol/L (ref 98–111)
Creatinine, Ser: 1.63 mg/dL — ABNORMAL HIGH (ref 0.44–1.00)
Creatinine, Ser: 1.18 mg/dL — ABNORMAL HIGH (ref 0.44–1.00)
GFR calc Af Amer: 47 mL/min — ABNORMAL LOW (ref >60)
GFR calc Af Amer: >60 mL/min (ref >60)
GFR calc non Af Amer: 40 mL/min — ABNORMAL LOW (ref >60)
GFR calc non Af Amer: 60 mL/min — ABNORMAL LOW (ref >60)
Glucose, Bld: 202 mg/dL — ABNORMAL HIGH (ref 70–99)
Glucose, Bld: 168 mg/dL — ABNORMAL HIGH (ref 70–99)
Phosphorus: 5.1 mg/dL — ABNORMAL HIGH (ref 2.5–4.6)
Phosphorus: 3 mg/dL (ref 2.5–4.6)
Potassium: 4.3 mmol/L (ref 3.5–5.1)
Potassium: 3.5 mmol/L (ref 3.5–5.1)
Sodium: 136 mmol/L (ref 135–145)
Sodium: 140 mmol/L (ref 135–145)

## 2020-01-06 LAB — GLUCOSE, CAPILLARY
Glucose-Capillary: 150 mg/dL — ABNORMAL HIGH (ref 70–99)
Glucose-Capillary: 173 mg/dL — ABNORMAL HIGH (ref 70–99)
Glucose-Capillary: 178 mg/dL — ABNORMAL HIGH (ref 70–99)
Glucose-Capillary: 196 mg/dL — ABNORMAL HIGH (ref 70–99)
Glucose-Capillary: 196 mg/dL — ABNORMAL HIGH (ref 70–99)
Glucose-Capillary: 202 mg/dL — ABNORMAL HIGH (ref 70–99)
Glucose-Capillary: 221 mg/dL — ABNORMAL HIGH (ref 70–99)

## 2020-01-06 LAB — PATHOLOGIST SMEAR REVIEW

## 2020-01-06 LAB — LIPASE, BLOOD: Lipase: 41 U/L (ref 11–51)

## 2020-01-06 MED ORDER — FLUCONAZOLE 40 MG/ML PO SUSR
200.0000 mg | Freq: Every day | ORAL | Status: DC
  Administered 2020-01-06 – 2020-01-07 (×2): 200 mg
  Filled 2020-01-06 (×2): qty 5

## 2020-01-06 NOTE — Progress Notes (Signed)
  [facility] Progress Note   Assessment/ Plan:   1.  AKI: d/t septic/ distributive shock with pancreatitis and hyperglycemia.  CRRT started 6/12, continue.  All 4K fluids, no heparin  2.  Acute hypoxic RF: intubated per PCCM  3.  Pancreatitis: on broad-spectrum antibiotics and antifungals.  4.  Shock: septic/ distributive- as above abx/ antifungals, on pressors  5.  Seizure activity: on anti-epileptics  6.  Diabetes: per primary  7.  Dispo: in ICU  Subjective:    Continues on CRRT.  Remains on pressor and broad-spectrum antibiotics.   Objective:   BP 101/60 (BP Location: Left Wrist)   Pulse 71   Temp (!) 97.5 F (36.4 C) (Axillary)   Resp (!) 24   Ht _0  (1.626 m)   Wt (!) 177.7 kg   SpO2 98%   BMI 67.25 kg/m   Physical Exam: Gen: intubated, sedated, ++jaundiced CVS: RRR Resp: mech Abd: soft, nontender Ext: 1+ LE edema ACCESS: R IJ nontunneled HD cath  Labs: BMET Recent Labs  Lab 01/03/20 0348 01/03/20 1604 01/04/20 0309 01/04/20 1553 01/05/20 0329 01/05/20 1604 01/06/20 0407  NA 135 136 135  135 135 135 137 136  K 5.1 5.4* 5.0  5.0 5.3* 5.6* 4.4 4.3  CL 99 100 101  101 102 102 103 101  CO2 19* 18* 17*  18* 17* 17* 19* 18*  GLUCOSE 159* 198* 185*  182* 182* 257* 215* 202*  BUN 25* 27* 28*  27* 28* 27* 27* 26*  CREATININE 2.23* 2.26* 2.17*  2.16* 2.11* 2.02* 1.77* 1.63*  CALCIUM 8.4* 8.3* 8.3*  8.4* 8.4* 8.1* 8.4* 8.1*  PHOS 3.9 4.9* 5.5* 5.3* 5.2* 5.2* 5.1*   CBC Recent Labs  Lab 01/04/20 0309 01/04/20 0659 01/05/20 0329 01/06/20 0408  WBC 36.1* 35.3* 35.9* 24.2*  NEUTROABS  --  24.2* 25.3* 18.7*  HGB 11.4* 11.3* 10.0* 9.5*  HCT 38.7 39.1 33.9* 32.8*  MCV 100.0 100.3* 98.8 101.9*  PLT 145* 154 158 154      Medications:    . B-complex with vitamin C  1 tablet Per Tube Daily  . chlorhexidine gluconate (MEDLINE KIT)  15 mL Mouth Rinse BID  . Chlorhexidine Gluconate Cloth  6 each Topical Q0600  . docusate  100  mg Per Tube BID  . feeding supplement (PRO-STAT SUGAR FREE 64)  30 mL Per Tube Daily  . fluconazole  200 mg Per Tube Daily  . heparin  5,000 Units Subcutaneous Q8H  . hydrocortisone sod succinate (SOLU-CORTEF) inj  50 mg Intravenous Q6H  . insulin aspart  3-9 Units Subcutaneous Q4H  . insulin detemir  20 Units Subcutaneous Q12H  . levETIRAcetam  1,000 mg Per Tube BID  . mouth rinse  15 mL Mouth Rinse 10 times per day  . pantoprazole sodium  40 mg Per Tube Daily  . polyethylene glycol  17 g Per Tube Daily  . sodium chloride flush  10-40 mL Intracatheter Q12H     Madelon Lips MD 01/06/2020, 10:49 AM

## 2020-01-06 NOTE — Progress Notes (Addendum)
NAME:  Courtney Zamora, MRN:  [id number], DOB:  September 02, 1985, LOS: 10 ADMISSION DATE:  01/11/2020, CONSULTATION DATE:  01/06/20 REFERRING MD:  EDP, CHIEF COMPLAINT:  seizures   Brief History   34 y.o. F with PMH of RA, Type 2 DM, possible seizures, HTN who presented with shaking of abnormal jerking movements thought to be seizures, AMS and hyperglycemia.  Pt loaded with Keppra, head CT negative, also developed fever and had a significant lactic acidosis.  Given AMS, PCCM consulted for admission.  History of present illness   Courtney Zamora is a  RA on cellcept, Type 2 DM, possible seizures (no prior EEG and no medications per husband), HTN who presented with shaking of abnormal jerking movements thought to be seizures, AMS and hyperglycemia.   He reports that about two days ago she was having trouble holding a cup because of weakness and was only eating fruit because everything tasted metallic.  He states her blood sugar was high, but it usually is.  She had some constipation, but did not complain of abdominal pain, diarrhea, nausea, vomiting or fever.  Earlier in the day, he noted her L arm shaking, he thought she was having a seizure, though this was different than her usual seizure.  These he describes as being diagnosed by a PCP and not on any medication.  She was sometimes scratch her face and pull her hair and then be fine immediately afterwards, she does not seek medical care after these episodes.  In the ED, pt was having continued twitching/jerking of the L side and was loaded with Keppra. CT head negative.  ED course significant for worsening mental status, oxygen requirement, lactic acid 10.8, K 2.4, Glucose >1100, anion gap 25, Co2 34.  Pt was able to intermittently answer questions and c/o abdominal pain.   She was given K and 3L IVF, she then spiked a fever to 101F.  Pt seen by neurology and EEG did not show evidence of seizures.   PCCM consulted for admission  Past Medical History    has a past medical history of Anxiety, Asthma, Chronic headache, Chronic lower back pain, Depression, Essential hypertension, Family history of adverse reaction to anesthesia, GERD (gastroesophageal reflux disease), Manic state (HCC), Migraine, Obesity, OSA on CPAP, Osteoarthritis, knee, Pneumonia, Prolonged QT syndrome, PTSD (post-traumatic stress disorder), Rheumatoid arthritis (HCC), Seizures (HCC), Severe needle phobia, and Type II diabetes mellitus (HCC).   Significant Hospital Events   6/11 Admit to [facility] 6/13 still profoundly Zamora, septic requiring 3 pressors   Consults:  neurology Renal: CRRT  Procedures:  6/12 endotracheal intubation 6/12 central line placement in the right IJ 6/13 left IJ central line placement 6/13 right IJ hemodialysis catheter 6/18 left foot arterial line placement  Significant Diagnostic Tests:  6/11 CT head>> limited due to patient motion, no acute findings 6/11 CT abd/pelvis>> There is some mild fat stranding about the pancreas, which may represent acute pancreatitis. There is some mild wall thickening of the duodenum, which may be reactive or secondary to duodenitis. 6/12 chest x-ray following IJ and endotracheal tube-line in good placement, ET tube adequately placed OG tube adequately placed 6/19-CT abdomen pelvis shows no biliary obstruction.  Worsening pancreatitis.  Significant rectal stool burden.  Micro Data:  6/12 BCx2 >> no growth x4 days  6/12 UC>> NG 6/16 BCx2 >> NG@ 24h  Antimicrobials:  Zosyn 6/12-6/18 Vancomycin 6/12- 6/17 Merrem 6/19 >  Diflucan 6/21 >  Interim history/subjective:  Pressor requirements improved, on 8 levo.  Jaundice stable per RN  Objective   Blood pressure 109/63, pulse 78, temperature (!) 97.5 F (36.4 C), temperature source Axillary, resp. rate (!) 26, height 5\' 4"  (1.626 m), weight (!) 177.7 kg, SpO2 100 %. CVP:  [4 mmHg-13 mmHg] 10 mmHg  Vent Mode: PRVC FiO2 (%):  [40 %] 40 % Set Rate:  [16 bmp] 16  bmp Vt Set:  [440 mL] 440 mL PEEP:  [8 cmH20] 8 cmH20 Plateau Pressure:  [19 cmH20-22 cmH20] 19 cmH20   Intake/Output Summary (Last 24 hours) at 01/06/2020 0806 Last data filed at 01/06/2020 0800 Gross per 24 hour  Intake 1107.36 ml  Output 1969 ml  Net -861.64 ml   Filed Weights   01/04/20 0326 01/05/20 0500 01/06/20 0428  Weight: (!) 176.7 kg (!) 178.9 kg (!) 177.7 kg   Physical Exam: General: Morbidly obese female, jaundiced, resting in bed, in NAD. Neuro: Sedated but opens eyes to voice and tracks appropriately. HEENT: /AT. Sclerae anicteric. ETT in place. Cardiovascular: RRR, no M/R/G.  Lungs: Respirations even and unlabored.  CTA bilaterally, No W/R/R. Abdomen: Morbidly obese. BS x 4, soft, NT/ND.  Musculoskeletal: No gross deformities, no edema.  Skin: Jaundiced.  Intertriginous fungal dermatitis.  Otherwise intact with no rashes.   Assessment & Plan:   Critically ill due to acute hypoxic respiratory failure with tachypnea - Continue vent support. - Attempt SBT given that shes had some improvement over past few days. - Trach consent already obtained in case this is needed (would need to decide so in next day or two).  Critically ill due to septic shock requiring titration of vasopressors - Continue levophed as needed for goal MAP > 65. - Continue stress steroids. - Continue abx.  Critically ill due to acute kidney injury requiring CRRT - Continue CRRT per nephrology. - Follow BMP.  Acute pancreatitis appears to be the underlying cause.  Gallbladder sludge noted but no acute obstruction. - Continue merrem - Consider GI consult   Poorly controlled type 2 diabetes. - SSI.  Malnutrition. - Start tube feeds again once post pyloric feeding tube has been placed.  Intertrigenous dermatitis / fungal infection - Add diflucan.  Questionable seizure like activity - EEG showed frequent sharply contoured triphasic waves suggestive of encephalopathy.  Unable to get MRI  due to body habitus. Left leg weakness. - Continue empiric keppra for now. - Needs outpatient neurology follow up per neuro.   Best practice:  Diet: Tube Feeds as tolerated once cortrak is placed Pain/Anxiety/Delirium protocol (if indicated): Fentanyl gtt VAP protocol (if indicated): In place DVT prophylaxis: SCDs GI prophylaxis: Protonix Glucose control: SSI Mobility: bed rest Code Status: full code Family Communication: Will call husband. Disposition: ICU  CC time: 35 min.   Montey Hora, [place] [facility] 01/06/2020, 8:08 AM

## 2020-01-06 NOTE — Progress Notes (Signed)
Initial Nutrition Assessment  DOCUMENTATION CODES:   Morbid obesity  INTERVENTION:   Resume tube feeding via OG tube: - Vital High Protein @ 50 ml/hr (1200 ml/day) - Pro-stat 30 ml daily  Tube feeding regimen provides 1300 kcal, 120 grams of protein, and 1003 ml of free water.   - B-complex with vitamin C  NUTRITION DIAGNOSIS:   Inadequate oral intake related to inability to eat as evidenced by NPO status.  Ongoing  GOAL:   Provide needs based on ASPEN/SCCM guidelines   Progressing  MONITOR:   Vent status, Labs, Weight trends, TF tolerance, I & O's  REASON FOR ASSESSMENT:   Ventilator, Consult Enteral/tube feeding initiation and management  ASSESSMENT:   34 year old female who presented on 6/11 with possible seizures, AMS, elevated blood glucose. PMH of rheumatoid arthritis, T2DM, HTN. Admitted with DKA, septic shock, acute hypoxic respiratory failure, possible pancreatitis, AKI.   6/12 - intubation 6/13 - CRRT initiated  Discussed patient in ICU rounds and with RN today. OG tube in place. TF has been on hold since 6/18 due to vomiting. Pt remains on CRRT. Cortrak tube placed today with tip in postpyloric position. TF being resumed today.   Patient is currently intubated on ventilator support MV: 14.7 L/min Temp (24hrs), Avg:97.7 F (36.5 C), Min:97.4 F (36.3 C), Max:98.6 F (37 C)   Medications reviewed and include: B-complex with vitamin C, colace, solu-cortef, novolog, levemir, miralax.  Labs reviewed: phos 5.1 (H) CBGs: 202-173-150  OGT output: 650 ml x 24 hours CRRT UF: 1256 ml x 24 hours I/O's: +10 L since admit   Diet Order:   Diet Order            Diet NPO time specified  Diet effective now                 EDUCATION NEEDS:   No education needs have been identified at this time  Skin:  Skin Assessment: Reviewed RN Assessment  Last BM:  6/20 type 6  Height:   Ht Readings from Last 1 Encounters:  12/28/19 5\' 4"  (1.626 m)     Weight:   Wt Readings from Last 1 Encounters:  01/06/20 (!) 177.7 kg    Ideal Body Weight:  54.5 kg  BMI:  Body mass index is 67.25 kg/m.  Estimated Nutritional Needs:   Kcal:  12/28/2019  Protein:  109-136 grams  Fluid:  >/= 1.2 L   [id number], RD, LDN, CNSC Please refer to Amion for contact information.

## 2020-01-06 NOTE — Procedures (Signed)
Cortrak  Person Inserting Tube:  Christianna Belmonte, RD Tube Type:  Cortrak - 43 inches Tube Location:  Right nare Initial Placement:  Postpyloric Secured by: Bridle Technique Used to Measure Tube Placement:  Documented cm marking at nare/ corner of mouth Cortrak Secured At:  89 cm    X-ray is required, abdominal x-ray has been ordered by the Cortrak team. Please confirm tube placement before using the Cortrak tube.   If the tube becomes dislodged please keep the tube and contact the Cortrak team at [email or web address] (password [email or web address]) for replacement.  If after hours and replacement cannot be delayed, place a NG tube and confirm placement with an abdominal x-ray.   Vanessa Kick RD, LDN Clinical Nutrition Pager listed in AMION

## 2020-01-06 NOTE — Consult Note (Signed)
WOC Nurse Consult Note: Reason for Consult: Areas of intertriginous dermatitis (ITD) to the subpannicular area with right lateral > central or left areas for skin loss and erythema. Bilateral inguinal erythema and maceration. No inframammary ITD, but posterior right back skin fold with skin loss and surrounding erythema. Wound type: Moisture Pressure Injury POA: N/A Measurement: 0.2cm x 30cm linear break in skin at right subpannicular area. Serosanguinous exudate.  Several surrounding breaks in skin. Other areas of ITD are erythematous with maceration, but no break in skin.  (excoriations) consistent with scratching Wound bed: red, moist  Drainage (amount, consistency, odor) scant serous in all areas except for right panus and right back skin fold where small amount of serosanguinous drainage is noted. Periwound: macerated, erythematous Dressing procedure/placement/frequency: Patient is on a mattress replacement with low air loss feature for microclimate mitigation. She is on antibiotics, which may contribute to fungal overgrowth in the skin folds. Bilateral pressure redistribution heel boots are provided as patient is immobile and critically ill. These will float heels and correct the lateral rotation of the foot. A sacral prophylactic foam dressing is in place.  If you agree and it is not contraindicated, consider several doses of systemic antifungal (eg. Diflucan) for resolution of intertriginous dermatitis and fungal over growth.   WOC nursing team will not follow, but will remain available to this patient, the nursing and medical teams.  Please re-consult if needed. Thanks, Ladona Mow, MSN, RN, GNP, [place]  Pager# [phone]

## 2020-01-07 DIAGNOSIS — E872 Acidosis: Secondary | ICD-10-CM

## 2020-01-07 DIAGNOSIS — R579 Shock, unspecified: Secondary | ICD-10-CM

## 2020-01-07 LAB — POCT I-STAT 7, (LYTES, BLD GAS, ICA,H+H)
Acid-base deficit: 2 mmol/L (ref 0.0–2.0)
Acid-base deficit: 4 mmol/L — ABNORMAL HIGH (ref 0.0–2.0)
Acid-base deficit: 8 mmol/L — ABNORMAL HIGH (ref 0.0–2.0)
Acid-base deficit: 4 mmol/L — ABNORMAL HIGH (ref 0.0–2.0)
Bicarbonate: 21.4 mmol/L (ref 20.0–28.0)
Bicarbonate: 20.7 mmol/L (ref 20.0–28.0)
Bicarbonate: 16.8 mmol/L — ABNORMAL LOW (ref 20.0–28.0)
Bicarbonate: 20.3 mmol/L (ref 20.0–28.0)
Calcium, Ion: 1.13 mmol/L — ABNORMAL LOW (ref 1.15–1.40)
Calcium, Ion: 1.08 mmol/L — ABNORMAL LOW (ref 1.15–1.40)
Calcium, Ion: 1.04 mmol/L — ABNORMAL LOW (ref 1.15–1.40)
Calcium, Ion: 1.01 mmol/L — ABNORMAL LOW (ref 1.15–1.40)
HCT: 32 % — ABNORMAL LOW (ref 36.0–46.0)
HCT: 36 % (ref 36.0–46.0)
HCT: 34 % — ABNORMAL LOW (ref 36.0–46.0)
HCT: 34 % — ABNORMAL LOW (ref 36.0–46.0)
Hemoglobin: 10.9 g/dL — ABNORMAL LOW (ref 12.0–15.0)
Hemoglobin: 12.2 g/dL (ref 12.0–15.0)
Hemoglobin: 11.6 g/dL — ABNORMAL LOW (ref 12.0–15.0)
Hemoglobin: 11.6 g/dL — ABNORMAL LOW (ref 12.0–15.0)
O2 Saturation: 96 %
O2 Saturation: 96 %
O2 Saturation: 96 %
O2 Saturation: 95 %
Patient temperature: 100.8
Patient temperature: 38
Potassium: 4.2 mmol/L (ref 3.5–5.1)
Potassium: 4.6 mmol/L (ref 3.5–5.1)
Potassium: 4.9 mmol/L (ref 3.5–5.1)
Potassium: 4.9 mmol/L (ref 3.5–5.1)
Sodium: 135 mmol/L (ref 135–145)
Sodium: 135 mmol/L (ref 135–145)
Sodium: 137 mmol/L (ref 135–145)
Sodium: 138 mmol/L (ref 135–145)
TCO2: 22 mmol/L (ref 22–32)
TCO2: 22 mmol/L (ref 22–32)
TCO2: 18 mmol/L — ABNORMAL LOW (ref 22–32)
TCO2: 21 mmol/L — ABNORMAL LOW (ref 22–32)
pCO2 arterial: 31.7 mmHg — ABNORMAL LOW (ref 32.0–48.0)
pCO2 arterial: 33.7 mmHg (ref 32.0–48.0)
pCO2 arterial: 32.7 mmHg (ref 32.0–48.0)
pCO2 arterial: 36.7 mmHg (ref 32.0–48.0)
pH, Arterial: 7.437 (ref 7.350–7.450)
pH, Arterial: 7.395 (ref 7.350–7.450)
pH, Arterial: 7.326 — ABNORMAL LOW (ref 7.350–7.450)
pH, Arterial: 7.355 (ref 7.350–7.450)
pO2, Arterial: 78 mmHg — ABNORMAL LOW (ref 83.0–108.0)
pO2, Arterial: 82 mmHg — ABNORMAL LOW (ref 83.0–108.0)
pO2, Arterial: 93 mmHg (ref 83.0–108.0)
pO2, Arterial: 85 mmHg (ref 83.0–108.0)

## 2020-01-07 LAB — CBC WITH DIFFERENTIAL/PLATELET
Abs Immature Granulocytes: 1.1 10*3/uL — ABNORMAL HIGH (ref 0.00–0.07)
Basophils Absolute: 0.1 10*3/uL (ref 0.0–0.1)
Basophils Relative: 0 %
Eosinophils Absolute: 0 10*3/uL (ref 0.0–0.5)
Eosinophils Relative: 0 %
HCT: 30.9 % — ABNORMAL LOW (ref 36.0–46.0)
Hemoglobin: 9.1 g/dL — ABNORMAL LOW (ref 12.0–15.0)
Immature Granulocytes: 8 %
Lymphocytes Relative: 6 %
Lymphs Abs: 0.8 10*3/uL (ref 0.7–4.0)
MCH: 29.3 pg (ref 26.0–34.0)
MCHC: 29.4 g/dL — ABNORMAL LOW (ref 30.0–36.0)
MCV: 99.4 fL (ref 80.0–100.0)
Monocytes Absolute: 0.5 10*3/uL (ref 0.1–1.0)
Monocytes Relative: 4 %
Neutro Abs: 11 10*3/uL — ABNORMAL HIGH (ref 1.7–7.7)
Neutrophils Relative %: 82 %
Platelets: 162 10*3/uL (ref 150–400)
RBC: 3.11 MIL/uL — ABNORMAL LOW (ref 3.87–5.11)
RDW: 20.3 % — ABNORMAL HIGH (ref 11.5–15.5)
WBC Morphology: INCREASED
WBC: 13.5 10*3/uL — ABNORMAL HIGH (ref 4.0–10.5)
nRBC: 5.2 % — ABNORMAL HIGH (ref 0.0–0.2)

## 2020-01-07 LAB — RENAL FUNCTION PANEL
Albumin: 1.7 g/dL — ABNORMAL LOW (ref 3.5–5.0)
Albumin: 1.8 g/dL — ABNORMAL LOW (ref 3.5–5.0)
Anion gap: 14 (ref 5–15)
Anion gap: 16 — ABNORMAL HIGH (ref 5–15)
BUN: 25 mg/dL — ABNORMAL HIGH (ref 6–20)
BUN: 27 mg/dL — ABNORMAL HIGH (ref 6–20)
CO2: 19 mmol/L — ABNORMAL LOW (ref 22–32)
CO2: 18 mmol/L — ABNORMAL LOW (ref 22–32)
Calcium: 8.2 mg/dL — ABNORMAL LOW (ref 8.9–10.3)
Calcium: 8.5 mg/dL — ABNORMAL LOW (ref 8.9–10.3)
Chloride: 100 mmol/L (ref 98–111)
Chloride: 100 mmol/L (ref 98–111)
Creatinine, Ser: 1.32 mg/dL — ABNORMAL HIGH (ref 0.44–1.00)
Creatinine, Ser: 1.54 mg/dL — ABNORMAL HIGH (ref 0.44–1.00)
GFR calc Af Amer: >60 mL/min (ref >60)
GFR calc Af Amer: 50 mL/min — ABNORMAL LOW (ref >60)
GFR calc non Af Amer: 52 mL/min — ABNORMAL LOW (ref >60)
GFR calc non Af Amer: 43 mL/min — ABNORMAL LOW (ref >60)
Glucose, Bld: 232 mg/dL — ABNORMAL HIGH (ref 70–99)
Glucose, Bld: 265 mg/dL — ABNORMAL HIGH (ref 70–99)
Phosphorus: 3 mg/dL (ref 2.5–4.6)
Phosphorus: 3.7 mg/dL (ref 2.5–4.6)
Potassium: 4 mmol/L (ref 3.5–5.1)
Potassium: 4.8 mmol/L (ref 3.5–5.1)
Sodium: 133 mmol/L — ABNORMAL LOW (ref 135–145)
Sodium: 134 mmol/L — ABNORMAL LOW (ref 135–145)

## 2020-01-07 LAB — CULTURE, BLOOD (ROUTINE X 2)
Culture: NO GROWTH
Culture: NO GROWTH
Special Requests: ADEQUATE
Special Requests: ADEQUATE

## 2020-01-07 LAB — GLUCOSE, CAPILLARY
Glucose-Capillary: 136 mg/dL — ABNORMAL HIGH (ref 70–99)
Glucose-Capillary: 161 mg/dL — ABNORMAL HIGH (ref 70–99)
Glucose-Capillary: 204 mg/dL — ABNORMAL HIGH (ref 70–99)
Glucose-Capillary: 238 mg/dL — ABNORMAL HIGH (ref 70–99)
Glucose-Capillary: 253 mg/dL — ABNORMAL HIGH (ref 70–99)
Glucose-Capillary: 261 mg/dL — ABNORMAL HIGH (ref 70–99)

## 2020-01-07 LAB — BASIC METABOLIC PANEL
Anion gap: 16 — ABNORMAL HIGH (ref 5–15)
BUN: 27 mg/dL — ABNORMAL HIGH (ref 6–20)
CO2: 17 mmol/L — ABNORMAL LOW (ref 22–32)
Calcium: 7.7 mg/dL — ABNORMAL LOW (ref 8.9–10.3)
Chloride: 104 mmol/L (ref 98–111)
Creatinine, Ser: 1.51 mg/dL — ABNORMAL HIGH (ref 0.44–1.00)
GFR calc Af Amer: 51 mL/min — ABNORMAL LOW (ref >60)
GFR calc non Af Amer: 44 mL/min — ABNORMAL LOW (ref >60)
Glucose, Bld: 144 mg/dL — ABNORMAL HIGH (ref 70–99)
Potassium: 5.3 mmol/L — ABNORMAL HIGH (ref 3.5–5.1)
Sodium: 137 mmol/L (ref 135–145)

## 2020-01-07 LAB — LACTIC ACID, PLASMA
Lactic Acid, Venous: 2.1 mmol/L (ref 0.5–1.9)
Lactic Acid, Venous: 7.5 mmol/L (ref 0.5–1.9)

## 2020-01-07 LAB — HEPATIC FUNCTION PANEL
ALT: 123 U/L — ABNORMAL HIGH (ref 0–44)
AST: 248 U/L — ABNORMAL HIGH (ref 15–41)
Albumin: 1.8 g/dL — ABNORMAL LOW (ref 3.5–5.0)
Alkaline Phosphatase: 1192 U/L — ABNORMAL HIGH (ref 38–126)
Bilirubin, Direct: 14.5 mg/dL — ABNORMAL HIGH (ref 0.0–0.2)
Indirect Bilirubin: 9.1 mg/dL — ABNORMAL HIGH (ref 0.3–0.9)
Total Bilirubin: 23.6 mg/dL (ref 0.3–1.2)
Total Protein: 5.8 g/dL — ABNORMAL LOW (ref 6.5–8.1)

## 2020-01-07 LAB — MAGNESIUM: Magnesium: 2.6 mg/dL — ABNORMAL HIGH (ref 1.7–2.4)

## 2020-01-07 LAB — TROPONIN I (HIGH SENSITIVITY): Troponin I (High Sensitivity): 61 ng/L — ABNORMAL HIGH (ref <18)

## 2020-01-07 LAB — HEMOGLOBIN AND HEMATOCRIT, BLOOD
HCT: 36.9 % (ref 36.0–46.0)
Hemoglobin: 10.8 g/dL — ABNORMAL LOW (ref 12.0–15.0)

## 2020-01-07 LAB — LIPASE, BLOOD: Lipase: 39 U/L (ref 11–51)

## 2020-01-07 MED ORDER — STERILE WATER FOR INJECTION IV SOLN
INTRAVENOUS | Status: DC
  Filled 2020-01-07 (×3): qty 850

## 2020-01-07 MED ORDER — PANTOPRAZOLE SODIUM 40 MG PO PACK
40.0000 mg | PACK | Freq: Every day | ORAL | Status: DC
  Filled 2020-01-07: qty 20

## 2020-01-07 MED ORDER — MIDAZOLAM 50MG/50ML (1MG/ML) PREMIX INFUSION
0.0000 mg/h | INTRAVENOUS | Status: DC
  Administered 2020-01-07: 0.5 mg/h via INTRAVENOUS
  Filled 2020-01-07: qty 50

## 2020-01-07 MED ORDER — VANCOMYCIN HCL 10 G IV SOLR
2500.0000 mg | Freq: Once | INTRAVENOUS | Status: AC
  Administered 2020-01-07: 2500 mg via INTRAVENOUS
  Filled 2020-01-07: qty 2500

## 2020-01-07 MED ORDER — FLUCONAZOLE 40 MG/ML PO SUSR: 200.0000 mg | Freq: Every day | ORAL | Status: DC

## 2020-01-07 MED ORDER — INSULIN ASPART 100 UNIT/ML ~~LOC~~ SOLN
3.0000 [IU] | SUBCUTANEOUS | Status: DC
  Administered 2020-01-07 (×2): 3 [IU] via SUBCUTANEOUS

## 2020-01-07 MED ORDER — POLYETHYLENE GLYCOL 3350 17 G PO PACK: 17.0000 g | PACK | Freq: Every day | ORAL | Status: DC

## 2020-01-07 MED ORDER — SODIUM CHLORIDE 0.9 % IV SOLN
200.0000 mg | Freq: Once | INTRAVENOUS | Status: AC
  Administered 2020-01-07: 200 mg via INTRAVENOUS
  Filled 2020-01-07: qty 200

## 2020-01-07 MED ORDER — SODIUM CHLORIDE 0.9 % IV BOLUS
1000.0000 mL | Freq: Once | INTRAVENOUS | Status: AC
  Administered 2020-01-07: 1000 mL via INTRAVENOUS

## 2020-01-07 MED ORDER — DEXTROSE 10 % IV SOLN: INTRAVENOUS | Status: DC

## 2020-01-07 MED ORDER — SODIUM BICARBONATE 8.4 % IV SOLN
100.0000 meq | Freq: Once | INTRAVENOUS | Status: AC
  Administered 2020-01-07: 100 meq via INTRAVENOUS
  Filled 2020-01-07: qty 100

## 2020-01-07 MED ORDER — INSULIN DETEMIR 100 UNIT/ML ~~LOC~~ SOLN
25.0000 [IU] | Freq: Two times a day (BID) | SUBCUTANEOUS | Status: DC
  Administered 2020-01-07: 25 [IU] via SUBCUTANEOUS
  Filled 2020-01-07 (×4): qty 0.25

## 2020-01-07 MED ORDER — B COMPLEX-C PO TABS
1.0000 | ORAL_TABLET | Freq: Every day | ORAL | Status: DC
  Administered 2020-01-08: 1
  Filled 2020-01-07: qty 1

## 2020-01-07 MED ORDER — VANCOMYCIN HCL 1500 MG/300ML IV SOLN
1500.0000 mg | INTRAVENOUS | Status: DC
  Filled 2020-01-07: qty 300

## 2020-01-07 MED ORDER — EPINEPHRINE HCL 5 MG/250ML IV SOLN IN NS
0.5000 ug/min | INTRAVENOUS | Status: DC
  Administered 2020-01-07: 18 ug/min via INTRAVENOUS
  Administered 2020-01-07: 1 ug/min via INTRAVENOUS
  Administered 2020-01-07 – 2020-01-08 (×5): 20 ug/min via INTRAVENOUS
  Filled 2020-01-07 (×6): qty 250

## 2020-01-07 MED ORDER — MIDAZOLAM HCL 2 MG/2ML IJ SOLN: 2.0000 mg | Freq: Once | INTRAMUSCULAR | Status: DC

## 2020-01-07 MED ORDER — SODIUM CHLORIDE 0.9 % IV SOLN
100.0000 mg | Freq: Once | INTRAVENOUS | Status: DC
  Filled 2020-01-07: qty 100

## 2020-01-07 MED ORDER — VASOPRESSIN 20 UNIT/ML IV SOLN
0.0300 [IU]/min | INTRAVENOUS | Status: DC
  Administered 2020-01-07 – 2020-01-08 (×2): 0.03 [IU]/min via INTRAVENOUS
  Filled 2020-01-07 (×3): qty 2

## 2020-01-07 NOTE — Progress Notes (Signed)
eLink Physician-Brief Progress Note Patient Name: Courtney Zamora DOB: [birth year] MRN: [id number]   Date of Service  01/07/2020  HPI/Events of Note  Multiple issues: 1. Levemir started and tube feeds stopped and 2. Hypotension - CVP approximately = 8.   eICU Interventions  Plan: 1. D10W to run IV at 20 mL/hour.  2. Bolus with 0.9 NaCl 1 liter IV over 1 hour now.         Jennylee Uehara Eugene 01/07/2020, 10:12 PM

## 2020-01-07 NOTE — Progress Notes (Signed)
[facility] Progress Note   Assessment/ Plan:   1.  AKI: d/t septic/ distributive shock with pancreatitis and hyperglycemia.  CRRT started 6/12, continue.  All 4K fluids, no heparin.  Start fluid removal- 50-100 mL/ hr for now and uptitrate as able.  D/w PCCM and RN staff.  2.  Acute hypoxic RF: intubated per PCCM, trying to wean  3.  Pancreatitis: on broad-spectrum antibiotics and antifungals.  4.  Shock: septic/ distributive- as above abx/ antifungals, on pressors  5.  Seizure activity: on anti-epileptics  6.  Diabetes: per primary  7.  Dispo: in ICU  Subjective:    Off and on pressors overnight.  Increasing edema.  TF restarted yesterday.     Objective:   BP (!) 100/55   Pulse (!) 102   Temp 98.8 F (37.1 C) (Axillary)   Resp (!) 35   Ht 5' 4" (1.626 m)   Wt (!) 179.2 kg   SpO2 93%   BMI 67.81 kg/m   Physical Exam: Gen: intubated, sedated, ++jaundiced CVS: RRR Resp: mech Abd: soft, nontender Ext: + anasarca ACCESS: R IJ nontunneled HD cath  Labs: BMET Recent Labs  Lab 01/04/20 0309 01/04/20 0309 01/04/20 1553 01/05/20 0329 01/05/20 1604 01/06/20 0407 01/06/20 1600 01/07/20 0357 01/07/20 0841  NA 135  135   < > 135 135 137 136 140 133* 135  K 5.0  5.0   < > 5.3* 5.6* 4.4 4.3 3.5 4.0 4.2  CL 101  101  --  102 102 103 101 109 100  --   CO2 17*  18*  --  17* 17* 19* 18* 18* 19*  --   GLUCOSE 185*  182*  --  182* 257* 215* 202* 168* 232*  --   BUN 28*  27*  --  28* 27* 27* 26* 21* 25*  --   CREATININE 2.17*  2.16*  --  2.11* 2.02* 1.77* 1.63* 1.18* 1.32*  --   CALCIUM 8.3*  8.4*  --  8.4* 8.1* 8.4* 8.1* 6.9* 8.2*  --   PHOS 5.5*  --  5.3* 5.2* 5.2* 5.1* 3.0 3.0  --    < > = values in this interval not displayed.   CBC Recent Labs  Lab 01/04/20 0659 01/04/20 0659 01/05/20 0329 01/06/20 0408 01/07/20 0357 01/07/20 0841  WBC 35.3*  --  35.9* 24.2* 13.5*  --   NEUTROABS 24.2*  --  25.3* 18.7* 11.0*  --   HGB 11.3*   < >  10.0* 9.5* 9.1* 10.9*  HCT 39.1   < > 33.9* 32.8* 30.9* 32.0*  MCV 100.3*  --  98.8 101.9* 99.4  --   PLT 154  --  158 154 162  --    < > = values in this interval not displayed.      Medications:    . B-complex with vitamin C  1 tablet Per Tube Daily  . chlorhexidine gluconate (MEDLINE KIT)  15 mL Mouth Rinse BID  . Chlorhexidine Gluconate Cloth  6 each Topical Q0600  . docusate  100 mg Per Tube BID  . feeding supplement (PRO-STAT SUGAR FREE 64)  30 mL Per Tube Daily  . fluconazole  200 mg Per Tube Daily  . heparin  5,000 Units Subcutaneous Q8H  . hydrocortisone sod succinate (SOLU-CORTEF) inj  50 mg Intravenous Q6H  . insulin aspart  3 Units Subcutaneous Q4H  . insulin aspart  3-9 Units Subcutaneous Q4H  . insulin detemir  25 Units  Subcutaneous Q12H  . levETIRAcetam  1,000 mg Per Tube BID  . mouth rinse  15 mL Mouth Rinse 10 times per day  . pantoprazole sodium  40 mg Per Tube Daily  . polyethylene glycol  17 g Per Tube Daily  . sodium chloride flush  10-40 mL Intracatheter Q12H     Madelon Lips MD 01/07/2020, 10:44 AM

## 2020-01-07 NOTE — Progress Notes (Signed)
Called E-link to notify that Epinephrine has been advanced to which is the max dose. Also communicated to them that per Dr. Chestine Spore the plan is to administer second dose of contrast then give paralytic before taking patient to CT. Called CT to request second bottle of contrast.

## 2020-01-07 NOTE — Progress Notes (Signed)
eLink Physician-Brief Progress Note Patient Name: Courtney Zamora DOB: Aug 27, 1985 MRN: [id number]   Date of Service  01/07/2020  HPI/Events of Note  Multiple issues: 1. Hypotension - BP = 65/49 with MAP = 56. CVP = 6-7. ABG on 40%/PRVC 16/TV 430/P 5 = 7.32/32.7/93.0/16.8. Not sedated enough to NMB for CT Scan and really too unstable for CT Scan  eICU Interventions  Plan: 1. Increase ceiling on Norepinephrine IV infusion to 60 mcg/min. 2. Bolus with 0.9 NaCl 1 liter IV over 1 hour now.  3. NaHCO3 100 meq IV X 1 now.         Jennie Hannay [name] 01/07/2020, 8:07 PM

## 2020-01-07 NOTE — Progress Notes (Signed)
Pharmacy Antibiotic Note  SUAD AUTREY is a 34 y.o. female with history of RA on steroids and Cellcept PTA admitted on 01/11/2020 with seizure, DKA, and concern for sepsis with unknown source. Patient continuing on CRRT and Merrem, now adding Vancomycin and Eraxis for worsening shock.   Plan: Add Vancomycin 2500mg  IV x1 then 1500 mg IV every 24 hours while on CRRT.  Continue Meropenem 1g IV q8h Eraxis 200mg  IV x1 then 100mg  IV every 24 hours.  Monitor CRRT, any new cultures, and clinical status.    Height: 5\' 4"  (162.6 cm) Weight: (!) 179.2 kg (395 lb 1 oz) IBW/kg (Calculated) : 54.7  Temp (24hrs), Avg:98.3 F (36.8 C), Min:96.6 F (35.9 C), Max:100.4 F (38 C)  Recent Labs  Lab 01/01/20 0809 01/01/20 1537 01/04/20 0309 01/04/20 0659 01/04/20 1553 01/04/20 1952 01/05/20 0329 01/05/20 0329 01/05/20 0718 01/05/20 1604 01/06/20 0407 01/06/20 0408 01/06/20 1600 01/07/20 0357 01/07/20 1438 01/07/20 1439  WBC  --    < > 36.1* 35.3*  --   --  35.9*  --   --   --   --  24.2*  --  13.5*  --   --   CREATININE  --    < > 2.17*  2.16*  --    < >  --  2.02*   < >  --  1.77* 1.63*  --  1.18* 1.32* 1.54*  --   LATICACIDVEN 3.8*  --   --   --   --  3.6*  --   --  1.8  --   --   --   --   --   --  2.1*   < > = values in this interval not displayed.    Estimated Creatinine Clearance: 84.1 mL/min (A) (by C-G formula based on SCr of 1.54 mg/dL (H)).       12/23/2019, PharmD, BCPS, BCCCP Clinical Pharmacist Please refer to [facility] for [facility] numbers 01/07/2020

## 2020-01-07 NOTE — Progress Notes (Signed)
NAME:  Courtney Zamora, MRN:  [id number], DOB:  1986-01-15, LOS: 58 ADMISSION DATE:  January 17, 2020, CONSULTATION DATE:  01/07/20 REFERRING MD:  EDP, CHIEF COMPLAINT:  seizures   Brief History   34 y.o. F with PMH of RA, Type 2 DM, possible seizures, HTN who presented with shaking of abnormal jerking movements thought to be seizures, AMS and hyperglycemia.  Pt loaded with Keppra, head CT negative, also developed fever and had a significant lactic acidosis.  Given AMS, PCCM consulted for admission.  History of present illness   Courtney Zamora is a  RA on cellcept, Type 2 DM, possible seizures (no prior EEG and no medications per husband), HTN who presented with shaking of abnormal jerking movements thought to be seizures, AMS and hyperglycemia.   He reports that about two days ago she was having trouble holding a cup because of weakness and was only eating fruit because everything tasted metallic.  He states her blood sugar was high, but it usually is.  She had some constipation, but did not complain of abdominal pain, diarrhea, nausea, vomiting or fever.  Earlier in the day, he noted her L arm shaking, he thought she was having a seizure, though this was different than her usual seizure.  These he describes as being diagnosed by a PCP and not on any medication.  She was sometimes scratch her face and pull her hair and then be fine immediately afterwards, she does not seek medical care after these episodes.  In the ED, pt was having continued twitching/jerking of the L side and was loaded with Keppra. CT head negative.  ED course significant for worsening mental status, oxygen requirement, lactic acid 10.8, K 2.4, Glucose >1100, anion gap 25, Co2 34.  Pt was able to intermittently answer questions and c/o abdominal pain.   She was given K 32meq and 3L IVF, she then spiked a fever to 101F.  Pt seen by neurology and EEG did not show evidence of seizures.   PCCM consulted for admission  Past Medical History    has a past medical history of Anxiety, Asthma, Chronic headache, Chronic lower back pain, Depression, Essential hypertension, Family history of adverse reaction to anesthesia, GERD (gastroesophageal reflux disease), Manic state ([place]), Migraine, Obesity, OSA on CPAP, Osteoarthritis, knee, Pneumonia, Prolonged QT syndrome, PTSD (post-traumatic stress disorder), Rheumatoid arthritis ([place]), Seizures ([place]), Severe needle phobia, and Type II diabetes mellitus ([place]).   Significant Hospital Events   6/11 Admit to [facility] 6/13 still profoundly sick, septic requiring 3 pressors   Consults:  neurology Renal: CRRT  Procedures:  6/12 endotracheal intubation 6/12 central line placement in the right IJ 6/13 left IJ central line placement 6/13 right IJ hemodialysis catheter 6/18 left foot arterial line placement  Significant Diagnostic Tests:  6/11 CT head>> limited due to patient motion, no acute findings 6/11 CT abd/pelvis>> There is some mild fat stranding about the pancreas, which may represent acute pancreatitis. There is some mild wall thickening of the duodenum, which may be reactive or secondary to duodenitis. 6/12 chest x-ray following IJ and endotracheal tube-line in good placement, ET tube adequately placed OG tube adequately placed 6/19-CT abdomen pelvis shows no biliary obstruction.  Worsening pancreatitis.  Significant rectal stool burden.  Micro Data:  6/12 BCx2 >> neg 6/12 UC>> neg 6/16 BCx2 >>   Antimicrobials:  Zosyn 6/12-6/18 Vancomycin 6/12- 6/17 Merrem 6/19 >  Diflucan 6/21 >  Interim history/subjective:  Had been off pressors yesterday afternoon but back on this AM.  Leukocytosis improving. RR up to 40s with SBT, placed back to full support but still with RR in mid to high 30s.  ABG surprisingly reassuring  Objective   Blood pressure (!) 106/59, pulse 99, temperature 98.8 F (37.1 C), temperature source Axillary, resp. rate (!) 39, height 5\' 4"  (1.626 m), weight (!) 179.2  kg, SpO2 93 %. CVP:  [7 mmHg-12 mmHg] 12 mmHg  Vent Mode: PRVC FiO2 (%):  [30 %-39 %] 39 % Set Rate:  [16 bmp] 16 bmp Vt Set:  [430 mL] 430 mL PEEP:  [5 cmH20] 5 cmH20 Pressure Support:  [5 cmH20] 5 cmH20 Plateau Pressure:  [17 cmH20-22 cmH20] 17 cmH20   Intake/Output Summary (Last 24 hours) at 01/07/2020 0835 Last data filed at 01/07/2020 0800 Gross per 24 hour  Intake 1437.94 ml  Output 1562 ml  Net -124.06 ml   Filed Weights   01/05/20 0500 01/06/20 0428 01/07/20 0403  Weight: (!) 178.9 kg (!) 177.7 kg (!) 179.2 kg   Physical Exam: General: Morbidly obese female, jaundiced, resting in bed, in NAD. Neuro: Sedated but opens eyes to voice and tracks appropriately. HEENT: [place]/AT. Sclerae anicteric. ETT in place. Cardiovascular: RRR, no M/R/G.  Lungs: Respirations even and unlabored.  CTA bilaterally, No W/R/R. Abdomen: Morbidly obese. BS x 4, soft, NT/ND.  Musculoskeletal: No gross deformities, no edema.  Skin: Jaundice gradually improving.  Intertriginous fungal dermatitis.  Otherwise intact with no rashes.   Assessment & Plan:   Critically ill due to acute hypoxic respiratory failure. - Back to full vent support as did not tolerate SBT with tachypnea.  - Appears that she will need trach, will discuss timing with family and MD today.  Critically ill due to septic shock requiring titration of vasopressors. - Continue levophed as needed for goal MAP > 65. - Continue stress steroids. - Continue empiric merrem.  Critically ill due to acute kidney injury requiring CRRT. - Continue CRRT per nephrology. - Start pulling volume gradually and adjust accordingly based on her response, discussed with nephrology. - Follow BMP.  Acute pancreatitis. - Continue merrem. - Defer GI consult for now.  Poorly controlled type 2 diabetes. - SSI.  Malnutrition. - Continue tube feeds.  Intertrigenous dermatitis / fungal infection. - Continue diflucan.  Questionable seizure like  activity - EEG showed frequent sharply contoured triphasic waves suggestive of encephalopathy.  Unable to get MRI due to body habitus. Left leg weakness. - Continue empiric keppra for now. - Needs outpatient neurology follow up per neuro.   Best practice:  Diet: Tube Feeds as tolerated once cortrak is placed Pain/Anxiety/Delirium protocol (if indicated): Fentanyl gtt VAP protocol (if indicated): In place DVT prophylaxis: SCDs GI prophylaxis: Protonix Glucose control: SSI Mobility: bed rest Code Status: full code Family Communication: Attempted to call family 6/21 but no answer.  Will try again today. Disposition: ICU  CC time: 35 min.   7/21, Rutherford Guys [place] [facility] 01/07/2020, 8:35 AM

## 2020-01-08 ENCOUNTER — Inpatient Hospital Stay (HOSPITAL_COMMUNITY): Payer: Medicaid Other

## 2020-01-08 DIAGNOSIS — E162 Hypoglycemia, unspecified: Secondary | ICD-10-CM

## 2020-01-08 DIAGNOSIS — G9340 Encephalopathy, unspecified: Secondary | ICD-10-CM

## 2020-01-08 LAB — HEPATIC FUNCTION PANEL
ALT: 111 U/L — ABNORMAL HIGH (ref 0–44)
AST: 284 U/L — ABNORMAL HIGH (ref 15–41)
Albumin: 1.7 g/dL — ABNORMAL LOW (ref 3.5–5.0)
Alkaline Phosphatase: 1007 U/L — ABNORMAL HIGH (ref 38–126)
Bilirubin, Direct: 14.1 mg/dL — ABNORMAL HIGH (ref 0.0–0.2)
Indirect Bilirubin: 5.2 mg/dL — ABNORMAL HIGH (ref 0.3–0.9)
Total Bilirubin: 19.3 mg/dL (ref 0.3–1.2)
Total Protein: 4.9 g/dL — ABNORMAL LOW (ref 6.5–8.1)

## 2020-01-08 LAB — CBC WITH DIFFERENTIAL/PLATELET
Abs Immature Granulocytes: 1.19 10*3/uL — ABNORMAL HIGH (ref 0.00–0.07)
Basophils Absolute: 0.2 10*3/uL — ABNORMAL HIGH (ref 0.0–0.1)
Basophils Relative: 2 %
Eosinophils Absolute: 0.1 10*3/uL (ref 0.0–0.5)
Eosinophils Relative: 1 %
HCT: 36.8 % (ref 36.0–46.0)
Hemoglobin: 10.5 g/dL — ABNORMAL LOW (ref 12.0–15.0)
Immature Granulocytes: 9 %
Lymphocytes Relative: 15 %
Lymphs Abs: 1.8 10*3/uL (ref 0.7–4.0)
MCH: 29.1 pg (ref 26.0–34.0)
MCHC: 28.5 g/dL — ABNORMAL LOW (ref 30.0–36.0)
MCV: 101.9 fL — ABNORMAL HIGH (ref 80.0–100.0)
Monocytes Absolute: 0.5 10*3/uL (ref 0.1–1.0)
Monocytes Relative: 4 %
Neutro Abs: 8.9 10*3/uL — ABNORMAL HIGH (ref 1.7–7.7)
Neutrophils Relative %: 69 %
Platelets: 271 10*3/uL (ref 150–400)
RBC: 3.61 MIL/uL — ABNORMAL LOW (ref 3.87–5.11)
RDW: 21.2 % — ABNORMAL HIGH (ref 11.5–15.5)
WBC: 12.7 10*3/uL — ABNORMAL HIGH (ref 4.0–10.5)
nRBC: 79.2 % — ABNORMAL HIGH (ref 0.0–0.2)

## 2020-01-08 LAB — RENAL FUNCTION PANEL
Albumin: 1.7 g/dL — ABNORMAL LOW (ref 3.5–5.0)
Anion gap: 18 — ABNORMAL HIGH (ref 5–15)
BUN: 25 mg/dL — ABNORMAL HIGH (ref 6–20)
CO2: 16 mmol/L — ABNORMAL LOW (ref 22–32)
Calcium: 7.5 mg/dL — ABNORMAL LOW (ref 8.9–10.3)
Chloride: 102 mmol/L (ref 98–111)
Creatinine, Ser: 1.47 mg/dL — ABNORMAL HIGH (ref 0.44–1.00)
GFR calc Af Amer: 53 mL/min — ABNORMAL LOW (ref >60)
GFR calc non Af Amer: 46 mL/min — ABNORMAL LOW (ref >60)
Glucose, Bld: 98 mg/dL (ref 70–99)
Phosphorus: 4.1 mg/dL (ref 2.5–4.6)
Potassium: 5 mmol/L (ref 3.5–5.1)
Sodium: 136 mmol/L (ref 135–145)

## 2020-01-08 LAB — POCT I-STAT 7, (LYTES, BLD GAS, ICA,H+H)
Acid-base deficit: 5 mmol/L — ABNORMAL HIGH (ref 0.0–2.0)
Bicarbonate: 18.9 mmol/L — ABNORMAL LOW (ref 20.0–28.0)
Calcium, Ion: 0.97 mmol/L — ABNORMAL LOW (ref 1.15–1.40)
HCT: 35 % — ABNORMAL LOW (ref 36.0–46.0)
Hemoglobin: 11.9 g/dL — ABNORMAL LOW (ref 12.0–15.0)
O2 Saturation: 97 %
Patient temperature: 38.9
Potassium: 5 mmol/L (ref 3.5–5.1)
Sodium: 138 mmol/L (ref 135–145)
TCO2: 20 mmol/L — ABNORMAL LOW (ref 22–32)
pCO2 arterial: 33.6 mmHg (ref 32.0–48.0)
pH, Arterial: 7.365 (ref 7.350–7.450)
pO2, Arterial: 100 mmHg (ref 83.0–108.0)

## 2020-01-08 LAB — PROTIME-INR
INR: 1.5 — ABNORMAL HIGH (ref 0.8–1.2)
Prothrombin Time: 17.9 seconds — ABNORMAL HIGH (ref 11.4–15.2)

## 2020-01-08 LAB — LACTIC ACID, PLASMA
Lactic Acid, Venous: 10 mmol/L (ref 0.5–1.9)
Lactic Acid, Venous: >11 mmol/L (ref 0.5–1.9)

## 2020-01-08 LAB — GLUCOSE, CAPILLARY
Glucose-Capillary: 104 mg/dL — ABNORMAL HIGH (ref 70–99)
Glucose-Capillary: 70 mg/dL (ref 70–99)
Glucose-Capillary: 59 mg/dL — ABNORMAL LOW (ref 70–99)
Glucose-Capillary: 90 mg/dL (ref 70–99)

## 2020-01-08 LAB — MAGNESIUM: Magnesium: 2.4 mg/dL (ref 1.7–2.4)

## 2020-01-08 LAB — AMMONIA: Ammonia: 62 umol/L — ABNORMAL HIGH (ref 9–35)

## 2020-01-08 MED ORDER — FENTANYL BOLUS VIA INFUSION
100.0000 ug | INTRAVENOUS | Status: DC | PRN
  Administered 2020-01-08 (×2): 100 ug via INTRAVENOUS
  Filled 2020-01-08: qty 100

## 2020-01-08 MED ORDER — MIDAZOLAM 50MG/50ML (1MG/ML) PREMIX INFUSION: 0.0000 mg/h | INTRAVENOUS | Status: DC

## 2020-01-08 MED ORDER — ALBUMIN HUMAN 25 % IV SOLN
12.5000 g | Freq: Once | INTRAVENOUS | Status: AC
  Administered 2020-01-08: 12.5 g via INTRAVENOUS
  Filled 2020-01-08: qty 50

## 2020-01-08 MED ORDER — PRISMASOL BGK 0/2.5 32-2.5 MEQ/L IV SOLN
INTRAVENOUS | Status: DC
  Filled 2020-01-08 (×3): qty 5000

## 2020-01-08 MED ORDER — MIDAZOLAM BOLUS VIA INFUSION (WITHDRAWAL LIFE SUSTAINING TX)
2.0000 mg | INTRAVENOUS | Status: DC | PRN
  Filled 2020-01-08: qty 2

## 2020-01-08 MED ORDER — SODIUM CHLORIDE 0.9 % IV SOLN
100.0000 mg | INTRAVENOUS | Status: DC
  Filled 2020-01-08: qty 100

## 2020-01-08 MED ORDER — GLYCOPYRROLATE 0.2 MG/ML IJ SOLN: 0.2000 mg | INTRAMUSCULAR | Status: DC | PRN

## 2020-01-08 MED ORDER — POLYVINYL ALCOHOL 1.4 % OP SOLN
1.0000 [drp] | Freq: Four times a day (QID) | OPHTHALMIC | Status: DC | PRN
  Filled 2020-01-08: qty 15

## 2020-01-08 MED ORDER — DIPHENHYDRAMINE HCL 50 MG/ML IJ SOLN: 25.0000 mg | INTRAMUSCULAR | Status: DC | PRN

## 2020-01-08 MED ORDER — FENTANYL 2500MCG IN NS 250ML (10MCG/ML) PREMIX INFUSION: 0.0000 ug/h | INTRAVENOUS | Status: DC

## 2020-01-08 MED ORDER — ACETAMINOPHEN 650 MG RE SUPP: 650.0000 mg | Freq: Four times a day (QID) | RECTAL | Status: DC | PRN

## 2020-01-08 MED ORDER — SODIUM CHLORIDE 0.9 % IV SOLN
1.2500 ng/kg/min | INTRAVENOUS | Status: DC
  Administered 2020-01-08: 40 ng/kg/min via INTRAVENOUS
  Administered 2020-01-08: 5 ng/kg/min via INTRAVENOUS
  Filled 2020-01-08 (×3): qty 1

## 2020-01-08 MED ORDER — SODIUM CHLORIDE 0.9 % IV BOLUS
1000.0000 mL | Freq: Once | INTRAVENOUS | Status: AC
  Administered 2020-01-08: 1000 mL via INTRAVENOUS

## 2020-01-08 MED ORDER — STERILE WATER FOR INJECTION IV SOLN
INTRAVENOUS | Status: DC
  Filled 2020-01-08 (×3): qty 150

## 2020-01-08 MED ORDER — MIDAZOLAM HCL 2 MG/2ML IJ SOLN: 1.0000 mg | INTRAMUSCULAR | Status: DC | PRN

## 2020-01-08 MED ORDER — FENTANYL CITRATE (PF) 100 MCG/2ML IJ SOLN: 50.0000 ug | INTRAMUSCULAR | Status: DC | PRN

## 2020-01-08 MED ORDER — ACETAMINOPHEN 325 MG PO TABS: 650.0000 mg | ORAL_TABLET | Freq: Four times a day (QID) | ORAL | Status: DC | PRN

## 2020-01-08 MED ORDER — GLYCOPYRROLATE 1 MG PO TABS: 1.0000 mg | ORAL_TABLET | ORAL | Status: DC | PRN

## 2020-01-08 MED ORDER — CALCIUM GLUCONATE-NACL 2-0.675 GM/100ML-% IV SOLN
2.0000 g | Freq: Once | INTRAVENOUS | Status: AC
  Administered 2020-01-08: 2000 mg via INTRAVENOUS
  Filled 2020-01-08: qty 100

## 2020-01-11 LAB — CULTURE, RESPIRATORY W GRAM STAIN

## 2020-01-16 NOTE — Plan of Care (Signed)
Family has been present at bedside throughout the day and understands that although we do not have a complete explanation of her decompensation, she has been unable to respond to all aggressive measures to stabilize her. They are planning for a terminal extubation today. Liberalized visitation today.  This patient is critically ill with multiple organ system failure which requires frequent high complexity decision making, assessment, support, evaluation, and titration of therapies. This was completed through the application of advanced monitoring technologies and extensive interpretation of multiple databases. During this encounter critical care time was devoted to patient care services described in this note for 25 minutes.  Steffanie Dunn, DO 12/26/2019 2:55 PM  Pulmonary & Critical Care

## 2020-01-16 NOTE — Progress Notes (Signed)
Notified E-link patient arterial MAP still <61 despite NS boluses. Last CVP 11. Advanced Levo to which is maximum.

## 2020-01-16 NOTE — Death Summary Note (Signed)
Physician Discharge Summary  Patient ID: Courtney Zamora MRN: [id number] DOB/AGE: [birth year] 34 y.o.  Admit date: 01/06/2020 Discharge date: 2020/01/26  Admission Diagnoses:  Seizures  Discharge Diagnoses:  Active Problems:   Acute respiratory failure (HCC)   AMS (altered mental status)   Septic shock (HCC)   AKI (acute kidney injury) (HCC)   Hyperosmolar (nonketotic) coma (HCC) Acute pancreatitis Multiorgan failure Acute liver injury Elevated transaminases Hyperbilirubinemia Seizures AKI due to ATN Acute metabolic encephalopathy Lactic acidosis Type 2 diabetes with hyperglycemia  Discharged Condition: Deceased  Hospital Course: She was admitted for concern for acute seizures thought to be due to hyperglycemia/ DKA and was managed with AEDs and DKA treatment. She subsequently developed progressive MOF with AKI 2/2 ATN and requiring CRRT, acute liver failure, acute respiratory failure requiring MV, anasarca, septic shock thought to be secondary to acute pancreatitis. She was given supportive care with pressors, antibiotics, insulin, IVF, CRRT, pain control, but developed rapidly progressive MOF and lactic acidosis leading to rapid deterioration and cardiopulmonary collapse. As her death was imminently near, her family transitioned her to focus on her comfort and had a palliative extubation. She passed away on Jan 26, 2020 at 15:04 with family at bedside.  Consults:  Nephrology Neurology Wound care  Procedures: 6/12 endotracheal intubation 6/12 central line placement in the right IJ 6/13 left IJ central line placement 6/13 right IJ hemodialysis catheter 6/18 left foot arterial line placement  Significant Diagnostic Studies:   6/11 CT head>> limited due to patient motion, no acute findings 6/11 CT abd/pelvis>> There is some mild fat stranding about the pancreas, which may represent acute pancreatitis. There is some mild wall thickening of the duodenum, which may be reactive or  secondary to duodenitis. 6/12 chest x-ray following IJ and endotracheal tube-line in good placement, ET tube adequately placed OG tube adequately placed 6/19-CT abdomen pelvis shows no biliary obstruction.  Worsening pancreatitis.  Significant rectal stool burden.  Treatments: Mechanical ventilation Vasopressors Antibiotics Steroids Bronchodilators   Disposition: Funeral home of the family's choosing     Signed: Steffanie Dunn 2020/01/26, 3:08 PM

## 2020-01-16 NOTE — Progress Notes (Signed)
eLink Physician-Brief Progress Note Patient Name: [name] DOB: 19-Oct-1985 MRN: [id number]   Date of Service  12/26/2019  HPI/Events of Note  Hypotension - BP = 84/45 with MAP = 57. CVP = 9. Already on Norepinephrine, Epinephrine and Vasopressin IV infusions. Already on stress dose Hydrocortisone. Suspect some degree of vasodilation with ongoing fever to 102.1. F  eICU Interventions  Will order: 1. Bolus with 0.9 NaCl 1 liter IV over 1 hour now.  2. Increase ceiling on Norepinephrine IV infusion to 80 mcg/min.      Intervention Category Major Interventions: Hypotension - evaluation and management  Yanira Tolsma Eugene 01/02/2020, 1:01 AM

## 2020-01-16 NOTE — Progress Notes (Signed)
eLink Physician-Brief Progress Note Patient Name: Courtney Zamora DOB: [birth year] MRN: [id number]   Date of Service  01/13/2020  HPI/Events of Note  Hypotension - BP = 80/58 with MAP = 62 by A-line. Patient on Norepinephrine, Epinephrine and Vasopressin IV infusions.   eICU Interventions  Plan: 1. 25% albumin 12.5 gm IV now.  2. ABG STAT.      Intervention Category Major Interventions: Hypotension - evaluation and management  Shreyan Hinz [name] 12/21/2019, 3:35 AM

## 2020-01-16 NOTE — Progress Notes (Signed)
eLink Physician-Brief Progress Note Patient Name: Courtney Zamora DOB: 1985-09-03 MRN: [id number]   Date of Service  01/12/2020  HPI/Events of Note  Hypotension - BP = 75/59 with MAP = 64. CVP = 10.   eICU Interventions  Plan: 1. 25% Albumin 12.5 gm IV now.      Intervention Category Major Interventions: Hypotension - evaluation and management  Sierra Spargo Eugene 12/20/2019, 6:11 AM

## 2020-01-16 NOTE — Progress Notes (Signed)
[facility] Progress Note   Assessment/ Plan:   1.  AKI: d/t septic/ distributive shock with pancreatitis and hyperglycemia.  CRRT started 6/12, continue.  No heparin.  K 5.0, change pre-fluids to 2K and post fluids to isotonic bicarb (severe lactic acidosis), 4K dialysate.  Also giving calcium gluconate.  Discussed with PCCM and family.  She is essentially maxed out on pressors.  If she continues to have profound hypotension on maximal support, benefits of CRRT may outweigh the risks and at that point RRT would not be offered anymore.      2.  Acute hypoxic RF: intubated per PCCM  3.  Pancreatitis: on broad-spectrum antibiotics and antifungals.  CT ordered but too unstable for transport.  4.  Shock: septic/ distributive- as above abx/ antifungals, on pressors  5.  Seizure activity: on anti-epileptics  6.  Diabetes: per primary  7.  Acute liver injury: per primary  8.  Dispo: in ICU  Subjective:    Dramatic worsening of status overnight.  Not responsive to multiple boluses, increased pressor requirement.  Family at bedside.  Grim prognosis--> have discussed with family at bedside.     Objective:   BP 92/64   Pulse (!) 113   Temp (!) 101.4 F (38.6 C) (Oral)   Resp (!) 38   Ht '5\' 4"'$  (1.626 m)   Wt (!) 184.6 kg   SpO2 98%   BMI 69.86 kg/m   Physical Exam: Gen: intubated, sedated, ++jaundiced CVS: RRR Resp: mech Abd: soft, nontender Ext: + anasarca ACCESS: R IJ nontunneled HD cath  Labs: BMET Recent Labs  Lab 01/05/20 0329 01/05/20 0329 01/05/20 1604 01/05/20 1604 01/06/20 0407 01/06/20 0407 01/06/20 1600 01/06/20 1600 01/07/20 0357 01/07/20 0841 01/07/20 1438 01/07/20 1446 01/07/20 1959 01/07/20 2043 01/07/20 2240 02-05-2020 0350 02/05/2020 0430  NA 135   < > 137   < > 136   < > 140   < > 133*   < > 134* 135 137 137 138 138 136  K 5.6*   < > 4.4   < > 4.3   < > 3.5   < > 4.0   < > 4.8 4.6 4.9 5.3* 4.9 5.0 5.0  CL 102   < > 103  --  101  --   109  --  100  --  100  --   --  104  --   --  102  CO2 17*   < > 19*  --  18*  --  18*  --  19*  --  18*  --   --  17*  --   --  16*  GLUCOSE 257*   < > 215*  --  202*  --  168*  --  232*  --  265*  --   --  144*  --   --  98  BUN 27*   < > 27*  --  26*  --  21*  --  25*  --  27*  --   --  27*  --   --  25*  CREATININE 2.02*   < > 1.77*  --  1.63*  --  1.18*  --  1.32*  --  1.54*  --   --  1.51*  --   --  1.47*  CALCIUM 8.1*   < > 8.4*  --  8.1*  --  6.9*  --  8.2*  --  8.5*  --   --  7.7*  --   --  7.5*  PHOS 5.2*  --  5.2*  --  5.1*  --  3.0  --  3.0  --  3.7  --   --   --   --   --  4.1   < > = values in this interval not displayed.   CBC Recent Labs  Lab 01/05/20 0329 01/05/20 0329 01/06/20 0408 01/06/20 0408 01/07/20 0357 01/07/20 0841 01/07/20 1959 01/07/20 2240 2020/01/29 0350 January 29, 2020 0425  WBC 35.9*  --  24.2*  --  13.5*  --   --   --   --  12.7*  NEUTROABS 25.3*  --  18.7*  --  11.0*  --   --   --   --  8.9*  HGB 10.0*   < > 9.5*   < > 9.1*   < > 11.6* 11.6* 11.9* 10.5*  HCT 33.9*   < > 32.8*   < > 30.9*   < > 34.0* 34.0* 35.0* 36.8  MCV 98.8  --  101.9*  --  99.4  --   --   --   --  101.9*  PLT 158  --  154  --  162  --   --   --   --  271   < > = values in this interval not displayed.      Medications:    . B-complex with vitamin C  1 tablet Per Tube Q1200  . chlorhexidine gluconate (MEDLINE KIT)  15 mL Mouth Rinse BID  . Chlorhexidine Gluconate Cloth  6 each Topical Q0600  . docusate  100 mg Per Tube BID  . feeding supplement (PRO-STAT SUGAR FREE 64)  30 mL Per Tube Daily  . heparin  5,000 Units Subcutaneous Q8H  . hydrocortisone sod succinate (SOLU-CORTEF) inj  50 mg Intravenous Q6H  . levETIRAcetam  1,000 mg Per Tube BID  . mouth rinse  15 mL Mouth Rinse 10 times per day  . midazolam  2 mg Intravenous Once  . pantoprazole sodium  40 mg Per Tube q1800  . polyethylene glycol  17 g Per Tube Q1400  . sodium chloride flush  10-40 mL Intracatheter Q12H      Madelon Lips MD Jan 29, 2020, 9:46 AM

## 2020-01-16 NOTE — Progress Notes (Signed)
Pt. with cardiac time of death 27-Nov-1502 and verified by Dwyane Luo, RN & Lezlie Lye, RN. Heart nor breath sounds heard bilaterally for one full minute. Patient's belongings at bedside included clothes and a cell phone, which was returned to husband. Family at bedside at the time of death. MD notified.

## 2020-01-16 NOTE — Progress Notes (Signed)
NAME:  LOWELL MCGURK, MRN:  [id number], DOB:  1986/01/16, LOS: 12 ADMISSION DATE:  01/04/2020, CONSULTATION DATE:  01-23-2020 REFERRING MD:  EDP, CHIEF COMPLAINT:  seizures   Brief History   34 y.o. F with PMH of RA, Type 2 DM, possible seizures, HTN who presented with shaking of abnormal jerking movements thought to be seizures, AMS and hyperglycemia.  Pt loaded with Keppra, head CT negative, also developed fever and had a significant lactic acidosis.  Given AMS, PCCM consulted for admission.  History of present illness   Morgyn Lear is a  RA on cellcept, Type 2 DM, possible seizures (no prior EEG and no medications per husband), HTN who presented with shaking of abnormal jerking movements thought to be seizures, AMS and hyperglycemia.   He reports that about two days ago she was having trouble holding a cup because of weakness and was only eating fruit because everything tasted metallic.  He states her blood sugar was high, but it usually is.  She had some constipation, but did not complain of abdominal pain, diarrhea, nausea, vomiting or fever.  Earlier in the day, he noted her L arm shaking, he thought she was having a seizure, though this was different than her usual seizure.  These he describes as being diagnosed by a PCP and not on any medication.  She was sometimes scratch her face and pull her hair and then be fine immediately afterwards, she does not seek medical care after these episodes.  In the ED, pt was having continued twitching/jerking of the L side and was loaded with Keppra. CT head negative.  ED course significant for worsening mental status, oxygen requirement, lactic acid 10.8, K 2.4, Glucose >1100, anion gap 25, Co2 34.  Pt was able to intermittently answer questions and c/o abdominal pain.   She was given K 57meq and 3L IVF, she then spiked a fever to 101F.  Pt seen by neurology and EEG did not show evidence of seizures.   PCCM consulted for admission  Past Medical History    has a past medical history of Anxiety, Asthma, Chronic headache, Chronic lower back pain, Depression, Essential hypertension, Family history of adverse reaction to anesthesia, GERD (gastroesophageal reflux disease), Manic state ([place]), Migraine, Obesity, OSA on CPAP, Osteoarthritis, knee, Pneumonia, Prolonged QT syndrome, PTSD (post-traumatic stress disorder), Rheumatoid arthritis ([place]), Seizures ([place]), Severe needle phobia, and Type II diabetes mellitus ([place]).   Significant Hospital Events   6/11 Admit to [facility] 6/13 still profoundly sick, septic requiring 3 pressors   Consults:  neurology Renal: CRRT  Procedures:  6/12 endotracheal intubation 6/12 central line placement in the right IJ 6/13 left IJ central line placement 6/13 right IJ hemodialysis catheter 6/18 left foot arterial line placement  Significant Diagnostic Tests:  6/11 CT head>> limited due to patient motion, no acute findings 6/11 CT abd/pelvis>> There is some mild fat stranding about the pancreas, which may represent acute pancreatitis. There is some mild wall thickening of the duodenum, which may be reactive or secondary to duodenitis. 6/12 chest x-ray following IJ and endotracheal tube-line in good placement, ET tube adequately placed OG tube adequately placed 6/19-CT abdomen pelvis shows no biliary obstruction.  Worsening pancreatitis.  Significant rectal stool burden.  Micro Data:  6/12 BCx2 >> neg 6/12 UC>> neg 6/16 BCx2 >>   Antimicrobials:  Zosyn 6/12-6/18 Vancomycin 6/12- 6/17 Merrem 6/19 >  Diflucan 6/21 >6/22 anidulafungin 6/22>  Interim history/subjective:  Maxed on 3 pressors, unable to get CT due  to ongoing instability. Husband and sister at bedside.  Objective   Blood pressure (!) 78/68, pulse (!) 106, temperature (!) 101.4 F (38.6 C), temperature source Oral, resp. rate (!) 37, height 5\' 4"  (1.626 m), weight (!) 184.6 kg, SpO2 99 %. CVP:  [7 mmHg-20 mmHg] 20 mmHg  Vent Mode: PRVC FiO2 (%):   [30 %-40 %] 40 % Set Rate:  [16 bmp-30 bmp] 30 bmp Vt Set:  [430 mL] 430 mL PEEP:  [5 cmH20] 5 cmH20 Pressure Support:  [5 cmH20] 5 cmH20 Plateau Pressure:  [15 cmH20] 15 cmH20   Intake/Output Summary (Last 24 hours) at 12/31/2019 01/10/2020 Last data filed at 12/17/2019 0900 Gross per 24 hour  Intake 9292.24 ml  Output 1804 ml  Net 7488.24 ml   Filed Weights   01/06/20 0428 01/07/20 0403 01/06/2020 0448  Weight: (!) 177.7 kg (!) 179.2 kg (!) 184.6 kg   Physical Exam: General: Obese young woman lying in bed, critically ill-appearing Neuro: Intubated, sedated, not responsive.  Pupils reactive bilaterally.  No gag reflex, not withdrawing in any extremity. HEENT: /AT, icteric sclera.  Oral mucosa moist, ET tube, OG tube, NG tube in place. Cardiovascular: Tachycardic, regular rhythm, no murmurs Lungs: Tachypnea, breathing above the vent, mild rhonchi.  Thin tan secretions from ET tube, increased in amount since yesterday. Abdomen: Obese, soft, distended Musculoskeletal: Anasarca, no clubbing or cyanosis.   Skin: Jaundiced.  Delayed cap refill in all extremities.    Assessment & Plan:   Critically ill due to acute hypoxic respiratory failure. -Continue low tidal volume ventilation, goal plateau less than 30 and driving pressures in 15. -Titrate PEEP and FiO2 per ARDS protocol. -V this morning AP prevention protocol -Daily SAT and SBT when appropriate  -Too unstable for tracheostomy  Critically ill due to septic shock requiring titration of vasopressors-- dramatically worsened yesterday. - Continue levophed, epinephrine, vasopressin as needed for goal MAP > 65.  Adding angiotensin II today. - Continue stress steroids. - Continue empiric merrem, vanc, anidulafungin. -Family understands very poor prognosis given severe decline -CT abdomen if stable enough  Acute kidney injury requiring CRRT. Acute metabolic acidosis, lactic acidosis --Continue CRRT per nephrology.  May become too  unstable to continue to tolerate CRRT. -Continue bicarbonate infusion -Not volume responsive; greater than 6 L positive since yesterday -Serial BMPs and lactic acid levels  Acute metabolic encephalopathy due to critical illness -Sedation is required to tolerate mechanical ventilation -PAD protocol  Acute liver injury due to pancreatitis, worsened this admission.  Has history of chronic liver disease (likely NASH) per her husband. Hyperbilirubinemia -Checking ammonia level -Supportive care -Grim prognosis  Acute pancreatitis -Continue holding tube feeds.  Did not seem to tolerate them yesterday. -Continue all supportive care measures  Poorly controlled type 2 diabetes, now hypoglycemic. -Holding insulin -D10W increased; goal BG within normal range  Malnutrition. -Tolerating tube feeds.  If remains stable enough may require TPN or slowly restarting trickle feeds at a later date  Intertrigenous dermatitis / fungal infection. -In and will function  Questionable seizure like activity - EEG showed frequent sharply contoured triphasic waves suggestive of encephalopathy.  Unable to get MRI due to body habitus. Left leg weakness. -Continue empiric Keppra -Would need outpatient neurology follow-up if she survives this admission   Her husband is at bedside again this morning and has been updated overnight about her poor prognosis. He remains hopeful that she can make a recovery. Updated with nurses during bedside rounds.   Best practice:  Diet: Tube  Feeds as tolerated once cortrak is placed Pain/Anxiety/Delirium protocol (if indicated): Fentanyl gtt VAP protocol (if indicated): In place DVT prophylaxis: SCDs GI prophylaxis: Protonix Glucose control: SSI Mobility: bed rest Code Status: full code Family Communication: Attempted to call family 6/21 but no answer.  Will try again today. Disposition: ICU  This patient is critically ill with multiple organ system failure which  requires frequent high complexity decision making, assessment, support, evaluation, and titration of therapies. This was completed through the application of advanced monitoring technologies and extensive interpretation of multiple databases. During this encounter critical care time was devoted to patient care services described in this note for 45 minutes.  Steffanie Dunn, DO 2020-01-19 9:34 AM [facility]

## 2020-01-16 NOTE — Progress Notes (Addendum)
Discussed clinical decline with patient's husband, father, and sister at bedside.  Plan to transition to comfort care P -DNR -dc CRRT -Fent gtt, midaz gtt -extubate -wean pressors off -chaplain -dc all non-comfort related orders  ______________  Discussed clinical decompensation with Dr Chestine Spore (PCCM) and Dr Signe Colt (nephrology) -Discussed utility of CRRT with Dr. Signe Colt-- unfortunately we have reached limit of usefulness of CRRT and the patient is not tolerating this intervention. I will share this with family upon their arrival to bedside   _______________  Pt critically ill, intubated, on CRRT and on NE 80, Epi 20, vaso 0.03. Angio II was added this morning due to refractory shock. Unfortunately, pt has continued to decline. SBP 60-65.   I discussed clinical decline with husband at bedside and father over the phone. I expressed concern that despite best efforts and aggressive medical intervention patient continues to decline and is likely approaching end of life.  We discussed continued aggressive care vs comfort care. Father expresses desire to come to hospital to continue this conversation. Father and husband understand severity of critical illness and that patient may die in interim.  Code status updated to DNR as per discussion with husband.  We will continue current pressors and interventions and follow up with GOC upon father's arrival to bedside.   Time in consultation with family 37 minutes    Tessie Fass MSN, AGACNP-BC [facility] [phone] If no answer, [phone] 2020/02/01, 1:15 PM

## 2020-01-16 NOTE — Progress Notes (Signed)
Pt extubated to comfort care per physician order/family request. Pt suctioned via ETT and orally prior. Pt family remained at bedside. RT will be available if needed.

## 2020-01-16 NOTE — Progress Notes (Signed)
Hypoglycemic Event  CBG: 59  Treatment: D10 increased to 48ml/hr & 1/2 amp D50   Symptoms: none  Follow-up CBG: Time: 1159 CBG Result: 90  Possible Reasons for Event: TF held at this time d/t intolerance  Comments/MD notified: Dr. Chestine Spore notified and gave verbal order to increase D10 to 12ml/hr    Helyn Numbers

## 2020-01-16 DEATH — deceased

## 2020-03-10 NOTE — Progress Notes (Deleted)
Pt. Did not want to cancel due to the cost of cancellation or not showing so she came anyway even though she was very sick.  She has some sort of respiratory infection possibly .  She could not stand for me to try a mask on and she had to have a breathing treatment as soon as she arrived.  She was coughing steadily and really congested.  She really could not stay for the study.  I did thank her for trying to come in anyway but she needs to get better.                                           Myrene Buddy [place]  RPSGT

## 2024-02-07 NOTE — Procedures (Signed)
 [name]
